# Patient Record
Sex: Female | Born: 1940
Health system: Southern US, Community
[De-identification: ages and names within clinical notes are randomized; demographics above are authoritative.]

## PROBLEM LIST (undated history)

## (undated) DIAGNOSIS — F32A Depression, unspecified: Secondary | ICD-10-CM

## (undated) DIAGNOSIS — I5022 Chronic systolic (congestive) heart failure: Secondary | ICD-10-CM

## (undated) DIAGNOSIS — G4733 Obstructive sleep apnea (adult) (pediatric): Secondary | ICD-10-CM

## (undated) DIAGNOSIS — I447 Left bundle-branch block, unspecified: Secondary | ICD-10-CM

## (undated) DIAGNOSIS — I428 Other cardiomyopathies: Secondary | ICD-10-CM

## (undated) DIAGNOSIS — Z720 Tobacco use: Secondary | ICD-10-CM

## (undated) DIAGNOSIS — N183 Chronic kidney disease, stage 3 unspecified: Secondary | ICD-10-CM

## (undated) DIAGNOSIS — R519 Headache, unspecified: Secondary | ICD-10-CM

## (undated) DIAGNOSIS — F419 Anxiety disorder, unspecified: Secondary | ICD-10-CM

## (undated) DIAGNOSIS — T8859XA Other complications of anesthesia, initial encounter: Secondary | ICD-10-CM

## (undated) DIAGNOSIS — K219 Gastro-esophageal reflux disease without esophagitis: Secondary | ICD-10-CM

## (undated) DIAGNOSIS — E785 Hyperlipidemia, unspecified: Secondary | ICD-10-CM

## (undated) DIAGNOSIS — R51 Headache: Secondary | ICD-10-CM

## (undated) DIAGNOSIS — E039 Hypothyroidism, unspecified: Secondary | ICD-10-CM

## (undated) DIAGNOSIS — I2510 Atherosclerotic heart disease of native coronary artery without angina pectoris: Secondary | ICD-10-CM

## (undated) DIAGNOSIS — Z95810 Presence of automatic (implantable) cardiac defibrillator: Secondary | ICD-10-CM

## (undated) DIAGNOSIS — E119 Type 2 diabetes mellitus without complications: Secondary | ICD-10-CM

## (undated) DIAGNOSIS — D649 Anemia, unspecified: Secondary | ICD-10-CM

## (undated) DIAGNOSIS — G8929 Other chronic pain: Secondary | ICD-10-CM

## (undated) DIAGNOSIS — I10 Essential (primary) hypertension: Secondary | ICD-10-CM

## (undated) DIAGNOSIS — M1990 Unspecified osteoarthritis, unspecified site: Secondary | ICD-10-CM

## (undated) DIAGNOSIS — J189 Pneumonia, unspecified organism: Secondary | ICD-10-CM

## (undated) DIAGNOSIS — T4145XA Adverse effect of unspecified anesthetic, initial encounter: Secondary | ICD-10-CM

## (undated) HISTORY — DX: Other chronic pain: G89.29

## (undated) HISTORY — DX: Chronic systolic (congestive) heart failure: I50.22

## (undated) HISTORY — DX: Left bundle-branch block, unspecified: I44.7

## (undated) HISTORY — DX: Tobacco use: Z72.0

## (undated) HISTORY — DX: Chronic kidney disease, stage 3 (moderate): N18.3

## (undated) HISTORY — DX: Essential (primary) hypertension: I10

## (undated) HISTORY — DX: Type 2 diabetes mellitus without complications: E11.9

## (undated) HISTORY — DX: Obstructive sleep apnea (adult) (pediatric): G47.33

## (undated) HISTORY — DX: Atherosclerotic heart disease of native coronary artery without angina pectoris: I25.10

## (undated) HISTORY — PX: CHOLECYSTECTOMY: SHX55

## (undated) HISTORY — DX: Chronic kidney disease, stage 3 unspecified: N18.30

## (undated) HISTORY — DX: Other cardiomyopathies: I42.8

## (undated) HISTORY — DX: Hyperlipidemia, unspecified: E78.5

## (undated) HISTORY — PX: EYE SURGERY: SHX253

---

## 1998-05-29 ENCOUNTER — Ambulatory Visit: Admission: RE | Admit: 1998-05-29 | Discharge: 1998-05-29 | Payer: Self-pay | Admitting: Family Medicine

## 1998-07-13 ENCOUNTER — Encounter: Payer: Self-pay | Admitting: Neurological Surgery

## 1998-07-13 ENCOUNTER — Ambulatory Visit (HOSPITAL_COMMUNITY): Admission: RE | Admit: 1998-07-13 | Discharge: 1998-07-13 | Payer: Self-pay | Admitting: Neurological Surgery

## 1998-08-25 ENCOUNTER — Other Ambulatory Visit: Admission: RE | Admit: 1998-08-25 | Discharge: 1998-08-25 | Payer: Self-pay | Admitting: Obstetrics and Gynecology

## 1998-09-07 ENCOUNTER — Ambulatory Visit (HOSPITAL_COMMUNITY): Admission: RE | Admit: 1998-09-07 | Discharge: 1998-09-07 | Payer: Self-pay | Admitting: Gastroenterology

## 1999-08-26 ENCOUNTER — Encounter: Payer: Self-pay | Admitting: *Deleted

## 1999-08-26 ENCOUNTER — Inpatient Hospital Stay (HOSPITAL_COMMUNITY): Admission: EM | Admit: 1999-08-26 | Discharge: 1999-08-27 | Payer: Self-pay | Admitting: *Deleted

## 1999-09-13 ENCOUNTER — Encounter: Admission: RE | Admit: 1999-09-13 | Discharge: 1999-09-13 | Payer: Self-pay | Admitting: *Deleted

## 1999-09-13 ENCOUNTER — Encounter: Payer: Self-pay | Admitting: *Deleted

## 1999-09-15 ENCOUNTER — Ambulatory Visit (HOSPITAL_BASED_OUTPATIENT_CLINIC_OR_DEPARTMENT_OTHER): Admission: RE | Admit: 1999-09-15 | Discharge: 1999-09-15 | Payer: Self-pay | Admitting: *Deleted

## 1999-09-15 ENCOUNTER — Encounter (INDEPENDENT_AMBULATORY_CARE_PROVIDER_SITE_OTHER): Payer: Self-pay | Admitting: Specialist

## 2000-04-17 ENCOUNTER — Encounter: Payer: Self-pay | Admitting: Orthopedic Surgery

## 2000-04-17 ENCOUNTER — Encounter: Admission: RE | Admit: 2000-04-17 | Discharge: 2000-04-17 | Payer: Self-pay | Admitting: Orthopedic Surgery

## 2000-07-12 ENCOUNTER — Encounter (INDEPENDENT_AMBULATORY_CARE_PROVIDER_SITE_OTHER): Payer: Self-pay | Admitting: *Deleted

## 2000-07-12 ENCOUNTER — Ambulatory Visit (HOSPITAL_BASED_OUTPATIENT_CLINIC_OR_DEPARTMENT_OTHER): Admission: RE | Admit: 2000-07-12 | Discharge: 2000-07-12 | Payer: Self-pay | Admitting: *Deleted

## 2000-07-28 ENCOUNTER — Other Ambulatory Visit: Admission: RE | Admit: 2000-07-28 | Discharge: 2000-07-28 | Payer: Self-pay | Admitting: Obstetrics and Gynecology

## 2000-09-06 ENCOUNTER — Ambulatory Visit (HOSPITAL_BASED_OUTPATIENT_CLINIC_OR_DEPARTMENT_OTHER): Admission: RE | Admit: 2000-09-06 | Discharge: 2000-09-07 | Payer: Self-pay | Admitting: Orthopedic Surgery

## 2000-11-01 ENCOUNTER — Encounter: Admission: RE | Admit: 2000-11-01 | Discharge: 2000-11-01 | Payer: Self-pay | Admitting: Orthopedic Surgery

## 2000-11-01 ENCOUNTER — Encounter: Payer: Self-pay | Admitting: Orthopedic Surgery

## 2000-12-11 ENCOUNTER — Encounter: Payer: Self-pay | Admitting: Family Medicine

## 2000-12-11 ENCOUNTER — Encounter: Admission: RE | Admit: 2000-12-11 | Discharge: 2000-12-11 | Payer: Self-pay | Admitting: Family Medicine

## 2001-02-02 ENCOUNTER — Encounter: Payer: Self-pay | Admitting: Neurological Surgery

## 2001-02-06 ENCOUNTER — Encounter: Payer: Self-pay | Admitting: Neurological Surgery

## 2001-02-06 ENCOUNTER — Inpatient Hospital Stay (HOSPITAL_COMMUNITY): Admission: RE | Admit: 2001-02-06 | Discharge: 2001-02-08 | Payer: Self-pay | Admitting: Neurological Surgery

## 2001-04-12 ENCOUNTER — Encounter: Payer: Self-pay | Admitting: Neurological Surgery

## 2001-04-12 ENCOUNTER — Encounter: Admission: RE | Admit: 2001-04-12 | Discharge: 2001-04-12 | Payer: Self-pay | Admitting: Neurological Surgery

## 2001-05-09 ENCOUNTER — Ambulatory Visit (HOSPITAL_BASED_OUTPATIENT_CLINIC_OR_DEPARTMENT_OTHER): Admission: RE | Admit: 2001-05-09 | Discharge: 2001-05-10 | Payer: Self-pay | Admitting: Orthopedic Surgery

## 2001-06-11 ENCOUNTER — Encounter: Admission: RE | Admit: 2001-06-11 | Discharge: 2001-06-11 | Payer: Self-pay | Admitting: Orthopedic Surgery

## 2001-06-11 ENCOUNTER — Encounter: Payer: Self-pay | Admitting: Orthopedic Surgery

## 2001-06-28 ENCOUNTER — Encounter: Payer: Self-pay | Admitting: Internal Medicine

## 2001-07-26 ENCOUNTER — Encounter: Admission: RE | Admit: 2001-07-26 | Discharge: 2001-07-26 | Payer: Self-pay | Admitting: Neurological Surgery

## 2001-07-26 ENCOUNTER — Encounter: Payer: Self-pay | Admitting: Neurological Surgery

## 2001-08-13 ENCOUNTER — Other Ambulatory Visit: Admission: RE | Admit: 2001-08-13 | Discharge: 2001-08-13 | Payer: Self-pay | Admitting: Obstetrics and Gynecology

## 2001-09-05 ENCOUNTER — Encounter: Admission: RE | Admit: 2001-09-05 | Discharge: 2001-09-05 | Payer: Self-pay | Admitting: Anesthesiology

## 2001-09-11 ENCOUNTER — Encounter: Payer: Self-pay | Admitting: Internal Medicine

## 2001-09-11 ENCOUNTER — Ambulatory Visit (HOSPITAL_BASED_OUTPATIENT_CLINIC_OR_DEPARTMENT_OTHER): Admission: RE | Admit: 2001-09-11 | Discharge: 2001-09-11 | Payer: Self-pay | Admitting: Internal Medicine

## 2001-10-08 ENCOUNTER — Observation Stay (HOSPITAL_COMMUNITY): Admission: RE | Admit: 2001-10-08 | Discharge: 2001-10-09 | Payer: Self-pay | Admitting: Urology

## 2002-01-09 ENCOUNTER — Encounter: Admission: RE | Admit: 2002-01-09 | Discharge: 2002-04-09 | Payer: Self-pay | Admitting: Family Medicine

## 2002-03-13 ENCOUNTER — Encounter: Admission: RE | Admit: 2002-03-13 | Discharge: 2002-03-13 | Payer: Self-pay | Admitting: Anesthesiology

## 2002-03-13 ENCOUNTER — Encounter: Payer: Self-pay | Admitting: Anesthesiology

## 2002-04-08 ENCOUNTER — Ambulatory Visit (HOSPITAL_COMMUNITY): Admission: RE | Admit: 2002-04-08 | Discharge: 2002-04-08 | Payer: Self-pay | Admitting: Gastroenterology

## 2002-04-08 ENCOUNTER — Encounter (INDEPENDENT_AMBULATORY_CARE_PROVIDER_SITE_OTHER): Payer: Self-pay | Admitting: *Deleted

## 2002-09-09 ENCOUNTER — Encounter: Payer: Self-pay | Admitting: Emergency Medicine

## 2002-09-09 ENCOUNTER — Emergency Department (HOSPITAL_COMMUNITY): Admission: EM | Admit: 2002-09-09 | Discharge: 2002-09-09 | Payer: Self-pay | Admitting: Emergency Medicine

## 2002-09-18 ENCOUNTER — Encounter: Admission: RE | Admit: 2002-09-18 | Discharge: 2002-09-18 | Payer: Self-pay | Admitting: Endocrinology

## 2002-09-18 ENCOUNTER — Encounter: Payer: Self-pay | Admitting: Endocrinology

## 2002-11-14 ENCOUNTER — Encounter: Admission: RE | Admit: 2002-11-14 | Discharge: 2002-11-14 | Payer: Self-pay | Admitting: *Deleted

## 2002-11-14 ENCOUNTER — Encounter: Payer: Self-pay | Admitting: *Deleted

## 2002-11-18 ENCOUNTER — Ambulatory Visit (HOSPITAL_COMMUNITY): Admission: RE | Admit: 2002-11-18 | Discharge: 2002-11-18 | Payer: Self-pay | Admitting: *Deleted

## 2002-11-18 ENCOUNTER — Encounter (INDEPENDENT_AMBULATORY_CARE_PROVIDER_SITE_OTHER): Payer: Self-pay | Admitting: *Deleted

## 2003-01-17 ENCOUNTER — Other Ambulatory Visit: Admission: RE | Admit: 2003-01-17 | Discharge: 2003-01-17 | Payer: Self-pay | Admitting: Obstetrics and Gynecology

## 2004-01-15 ENCOUNTER — Encounter: Admission: RE | Admit: 2004-01-15 | Discharge: 2004-01-15 | Payer: Self-pay | Admitting: *Deleted

## 2004-04-28 ENCOUNTER — Other Ambulatory Visit: Admission: RE | Admit: 2004-04-28 | Discharge: 2004-04-28 | Payer: Self-pay | Admitting: Obstetrics and Gynecology

## 2004-06-10 ENCOUNTER — Encounter: Admission: RE | Admit: 2004-06-10 | Discharge: 2004-06-10 | Payer: Self-pay | Admitting: Orthopedic Surgery

## 2004-09-18 ENCOUNTER — Inpatient Hospital Stay (HOSPITAL_COMMUNITY): Admission: EM | Admit: 2004-09-18 | Discharge: 2004-09-20 | Payer: Self-pay | Admitting: Emergency Medicine

## 2004-10-19 ENCOUNTER — Encounter (INDEPENDENT_AMBULATORY_CARE_PROVIDER_SITE_OTHER): Payer: Self-pay | Admitting: *Deleted

## 2004-10-19 ENCOUNTER — Ambulatory Visit (HOSPITAL_COMMUNITY): Admission: RE | Admit: 2004-10-19 | Discharge: 2004-10-19 | Payer: Self-pay | Admitting: Obstetrics and Gynecology

## 2005-02-17 ENCOUNTER — Ambulatory Visit (HOSPITAL_COMMUNITY): Admission: RE | Admit: 2005-02-17 | Discharge: 2005-02-17 | Payer: Self-pay | Admitting: Otolaryngology

## 2005-02-17 ENCOUNTER — Encounter (INDEPENDENT_AMBULATORY_CARE_PROVIDER_SITE_OTHER): Payer: Self-pay | Admitting: *Deleted

## 2005-03-10 ENCOUNTER — Other Ambulatory Visit: Admission: RE | Admit: 2005-03-10 | Discharge: 2005-03-10 | Payer: Self-pay | Admitting: Obstetrics and Gynecology

## 2005-07-06 ENCOUNTER — Ambulatory Visit (HOSPITAL_COMMUNITY): Admission: RE | Admit: 2005-07-06 | Discharge: 2005-07-06 | Payer: Self-pay | Admitting: Neurological Surgery

## 2005-10-07 ENCOUNTER — Encounter: Admission: RE | Admit: 2005-10-07 | Discharge: 2005-10-07 | Payer: Self-pay | Admitting: Gastroenterology

## 2006-12-14 ENCOUNTER — Inpatient Hospital Stay (HOSPITAL_COMMUNITY): Admission: AD | Admit: 2006-12-14 | Discharge: 2006-12-15 | Payer: Self-pay | Admitting: Cardiology

## 2006-12-14 HISTORY — PX: CARDIAC CATHETERIZATION: SHX172

## 2006-12-18 ENCOUNTER — Encounter (INDEPENDENT_AMBULATORY_CARE_PROVIDER_SITE_OTHER): Payer: Self-pay | Admitting: Otolaryngology

## 2006-12-18 ENCOUNTER — Ambulatory Visit (HOSPITAL_COMMUNITY): Admission: RE | Admit: 2006-12-18 | Discharge: 2006-12-18 | Payer: Self-pay | Admitting: Otolaryngology

## 2007-01-03 ENCOUNTER — Ambulatory Visit: Admission: RE | Admit: 2007-01-03 | Discharge: 2007-02-12 | Payer: Self-pay | Admitting: Radiation Oncology

## 2007-05-04 ENCOUNTER — Ambulatory Visit (HOSPITAL_COMMUNITY): Admission: RE | Admit: 2007-05-04 | Discharge: 2007-05-04 | Payer: Self-pay | Admitting: Cardiology

## 2007-07-16 ENCOUNTER — Encounter: Admission: RE | Admit: 2007-07-16 | Discharge: 2007-07-16 | Payer: Self-pay | Admitting: Family Medicine

## 2007-08-13 ENCOUNTER — Encounter: Admission: RE | Admit: 2007-08-13 | Discharge: 2007-08-13 | Payer: Self-pay | Admitting: Cardiology

## 2007-12-02 ENCOUNTER — Encounter: Admission: RE | Admit: 2007-12-02 | Discharge: 2007-12-02 | Payer: Self-pay | Admitting: Family Medicine

## 2008-02-04 ENCOUNTER — Ambulatory Visit: Payer: Self-pay | Admitting: *Deleted

## 2008-02-04 ENCOUNTER — Inpatient Hospital Stay (HOSPITAL_COMMUNITY): Admission: AD | Admit: 2008-02-04 | Discharge: 2008-02-06 | Payer: Self-pay | Admitting: *Deleted

## 2008-06-09 ENCOUNTER — Encounter: Admission: RE | Admit: 2008-06-09 | Discharge: 2008-06-09 | Payer: Self-pay | Admitting: Gastroenterology

## 2008-10-02 ENCOUNTER — Telehealth: Payer: Self-pay | Admitting: Internal Medicine

## 2008-10-03 ENCOUNTER — Ambulatory Visit: Payer: Self-pay | Admitting: Internal Medicine

## 2008-10-03 DIAGNOSIS — I10 Essential (primary) hypertension: Secondary | ICD-10-CM | POA: Insufficient documentation

## 2008-10-03 DIAGNOSIS — G4733 Obstructive sleep apnea (adult) (pediatric): Secondary | ICD-10-CM | POA: Insufficient documentation

## 2008-10-03 DIAGNOSIS — G4730 Sleep apnea, unspecified: Secondary | ICD-10-CM | POA: Insufficient documentation

## 2008-10-03 DIAGNOSIS — J449 Chronic obstructive pulmonary disease, unspecified: Secondary | ICD-10-CM | POA: Insufficient documentation

## 2008-10-08 ENCOUNTER — Telehealth (INDEPENDENT_AMBULATORY_CARE_PROVIDER_SITE_OTHER): Payer: Self-pay | Admitting: *Deleted

## 2008-10-20 ENCOUNTER — Ambulatory Visit: Payer: Self-pay | Admitting: Internal Medicine

## 2008-11-20 ENCOUNTER — Ambulatory Visit: Payer: Self-pay | Admitting: Internal Medicine

## 2008-11-24 ENCOUNTER — Telehealth: Payer: Self-pay | Admitting: Internal Medicine

## 2008-11-26 ENCOUNTER — Encounter: Payer: Self-pay | Admitting: Internal Medicine

## 2008-12-10 ENCOUNTER — Encounter: Payer: Self-pay | Admitting: Internal Medicine

## 2008-12-12 ENCOUNTER — Encounter: Payer: Self-pay | Admitting: Internal Medicine

## 2008-12-18 ENCOUNTER — Telehealth (INDEPENDENT_AMBULATORY_CARE_PROVIDER_SITE_OTHER): Payer: Self-pay | Admitting: *Deleted

## 2009-02-02 ENCOUNTER — Encounter: Payer: Self-pay | Admitting: Internal Medicine

## 2009-07-03 ENCOUNTER — Encounter: Admission: RE | Admit: 2009-07-03 | Discharge: 2009-07-03 | Payer: Self-pay | Admitting: Obstetrics and Gynecology

## 2009-08-20 ENCOUNTER — Ambulatory Visit: Payer: Self-pay | Admitting: Cardiology

## 2009-08-20 ENCOUNTER — Inpatient Hospital Stay (HOSPITAL_COMMUNITY): Admission: EM | Admit: 2009-08-20 | Discharge: 2009-08-21 | Payer: Self-pay | Admitting: Emergency Medicine

## 2009-08-21 ENCOUNTER — Ambulatory Visit: Payer: Self-pay | Admitting: Vascular Surgery

## 2009-08-21 ENCOUNTER — Encounter (INDEPENDENT_AMBULATORY_CARE_PROVIDER_SITE_OTHER): Payer: Self-pay | Admitting: Internal Medicine

## 2009-10-31 ENCOUNTER — Encounter: Payer: Self-pay | Admitting: Internal Medicine

## 2009-12-23 ENCOUNTER — Encounter: Admission: RE | Admit: 2009-12-23 | Discharge: 2009-12-23 | Payer: Self-pay | Admitting: Neurology

## 2010-02-04 ENCOUNTER — Encounter: Payer: Self-pay | Admitting: Internal Medicine

## 2010-02-09 ENCOUNTER — Encounter: Payer: Self-pay | Admitting: Internal Medicine

## 2010-03-10 ENCOUNTER — Encounter
Admission: RE | Admit: 2010-03-10 | Discharge: 2010-03-10 | Payer: Self-pay | Source: Home / Self Care | Attending: Neurological Surgery | Admitting: Neurological Surgery

## 2010-03-22 ENCOUNTER — Ambulatory Visit (HOSPITAL_COMMUNITY)
Admission: RE | Admit: 2010-03-22 | Discharge: 2010-03-22 | Payer: Self-pay | Source: Home / Self Care | Attending: Neurological Surgery | Admitting: Neurological Surgery

## 2010-03-24 LAB — BASIC METABOLIC PANEL
BUN: 12 mg/dL (ref 6–23)
CO2: 29 mEq/L (ref 19–32)
Calcium: 9.8 mg/dL (ref 8.4–10.5)
Chloride: 103 mEq/L (ref 96–112)
Creatinine, Ser: 1.19 mg/dL (ref 0.4–1.2)
GFR calc Af Amer: 55 mL/min — ABNORMAL LOW (ref >60)
GFR calc non Af Amer: 45 mL/min — ABNORMAL LOW (ref >60)
Glucose, Bld: 161 mg/dL — ABNORMAL HIGH (ref 70–99)
Potassium: 3.8 mEq/L (ref 3.5–5.1)
Sodium: 141 mEq/L (ref 135–145)

## 2010-03-24 LAB — CBC
HCT: 40.9 % (ref 36.0–46.0)
Hemoglobin: 13.9 g/dL (ref 12.0–15.0)
MCH: 31.9 pg (ref 26.0–34.0)
MCHC: 34 g/dL (ref 30.0–36.0)
MCV: 93.8 fL (ref 78.0–100.0)
Platelets: 218 10*3/uL (ref 150–400)
RBC: 4.36 MIL/uL (ref 3.87–5.11)
RDW: 13.5 % (ref 11.5–15.5)
WBC: 7.6 10*3/uL (ref 4.0–10.5)

## 2010-03-24 LAB — SURGICAL PCR SCREEN
MRSA, PCR: NEGATIVE
Staphylococcus aureus: POSITIVE — AB

## 2010-03-27 ENCOUNTER — Encounter: Payer: Self-pay | Admitting: Neurological Surgery

## 2010-03-27 ENCOUNTER — Encounter: Payer: Self-pay | Admitting: *Deleted

## 2010-03-28 ENCOUNTER — Encounter: Payer: Self-pay | Admitting: *Deleted

## 2010-03-31 ENCOUNTER — Ambulatory Visit (HOSPITAL_COMMUNITY)
Admission: RE | Admit: 2010-03-31 | Discharge: 2010-03-31 | Payer: Self-pay | Source: Home / Self Care | Attending: Cardiovascular Disease | Admitting: Cardiovascular Disease

## 2010-03-31 LAB — GLUCOSE, CAPILLARY
Glucose-Capillary: 150 mg/dL — ABNORMAL HIGH (ref 70–99)
Glucose-Capillary: 108 mg/dL — ABNORMAL HIGH (ref 70–99)

## 2010-04-08 NOTE — Letter (Signed)
Summary: [facility]  [facility]   Imported By: Bubba Hales 02/12/2010 10:59:14  _____________________________________________________________________  External Attachment:    Type:   Image     Comment:   External Document

## 2010-04-08 NOTE — Letter (Signed)
Summary: [facility]  [facility]   Imported By: Bubba Hales 11/11/2009 08:49:32  _____________________________________________________________________  External Attachment:    Type:   Image     Comment:   External Document

## 2010-04-08 NOTE — Procedures (Signed)
Summary: Order for Overnight Pulse Oximetry/IDS  Order for Overnight Pulse Oximetry/IDS   Imported By: Phillis Knack 12/03/2008 11:22:17  _____________________________________________________________________  External Attachment:    Type:   Image     Comment:   External Document

## 2010-04-08 NOTE — Letter (Signed)
Summary: [facility]  [facility]   Imported By: Bubba Hales 02/19/2010 11:38:16  _____________________________________________________________________  External Attachment:    Type:   Image     Comment:   External Document

## 2010-04-08 NOTE — Letter (Signed)
Summary: [facility]  [facility]   Imported By: Bubba Hales 02/04/2009 09:41:38  _____________________________________________________________________  External Attachment:    Type:   Image     Comment:   External Document

## 2010-04-13 ENCOUNTER — Ambulatory Visit (HOSPITAL_COMMUNITY)
Admission: RE | Admit: 2010-04-13 | Discharge: 2010-04-13 | Disposition: A | Discharge status: Home / Self Care | Payer: Medicare FFS | Source: Ambulatory Visit | Attending: Neurological Surgery | Admitting: Neurological Surgery

## 2010-04-13 LAB — CBC
HCT: 41.1 % (ref 36.0–46.0)
Hemoglobin: 14 g/dL (ref 12.0–15.0)
MCH: 31.9 pg (ref 26.0–34.0)
MCHC: 34.1 g/dL (ref 30.0–36.0)
MCV: 93.6 fL (ref 78.0–100.0)
Platelets: 227 10*3/uL (ref 150–400)
RBC: 4.39 MIL/uL (ref 3.87–5.11)
RDW: 13.4 % (ref 11.5–15.5)
WBC: 7.3 10*3/uL (ref 4.0–10.5)

## 2010-04-13 LAB — BASIC METABOLIC PANEL
BUN: 10 mg/dL (ref 6–23)
CO2: 28 mEq/L (ref 19–32)
Calcium: 9.7 mg/dL (ref 8.4–10.5)
Chloride: 102 mEq/L (ref 96–112)
Creatinine, Ser: 1.08 mg/dL (ref 0.4–1.2)
GFR calc Af Amer: >60 mL/min (ref >60)
GFR calc non Af Amer: 50 mL/min — ABNORMAL LOW (ref >60)
Glucose, Bld: 160 mg/dL — ABNORMAL HIGH (ref 70–99)
Potassium: 4.8 mEq/L (ref 3.5–5.1)
Sodium: 140 mEq/L (ref 135–145)

## 2010-04-13 LAB — SURGICAL PCR SCREEN
MRSA, PCR: NEGATIVE
Staphylococcus aureus: NEGATIVE

## 2010-04-15 ENCOUNTER — Inpatient Hospital Stay (HOSPITAL_COMMUNITY)
Admission: RE | Admit: 2010-04-15 | Discharge: 2010-04-16 | DRG: 491 | Disposition: A | Discharge status: Home / Self Care | Payer: Medicare FFS | Source: Ambulatory Visit | Attending: Neurological Surgery | Admitting: Neurological Surgery

## 2010-04-15 ENCOUNTER — Ambulatory Visit (HOSPITAL_COMMUNITY)
Admission: RE | Admit: 2010-04-15 | Discharge: 2010-04-15 | Disposition: A | Discharge status: Home / Self Care | Payer: Medicare FFS | Source: Ambulatory Visit | Attending: Neurological Surgery | Admitting: Neurological Surgery

## 2010-04-15 ENCOUNTER — Other Ambulatory Visit (HOSPITAL_COMMUNITY): Payer: Self-pay | Admitting: Neurological Surgery

## 2010-04-15 DIAGNOSIS — Q762 Congenital spondylolisthesis: Principal | ICD-10-CM

## 2010-04-15 DIAGNOSIS — E039 Hypothyroidism, unspecified: Secondary | ICD-10-CM | POA: Diagnosis present

## 2010-04-15 DIAGNOSIS — Z9861 Coronary angioplasty status: Secondary | ICD-10-CM

## 2010-04-15 DIAGNOSIS — Z7982 Long term (current) use of aspirin: Secondary | ICD-10-CM

## 2010-04-15 DIAGNOSIS — G4733 Obstructive sleep apnea (adult) (pediatric): Secondary | ICD-10-CM | POA: Diagnosis present

## 2010-04-15 DIAGNOSIS — M545 Low back pain, unspecified: Secondary | ICD-10-CM

## 2010-04-15 DIAGNOSIS — E1149 Type 2 diabetes mellitus with other diabetic neurological complication: Secondary | ICD-10-CM | POA: Diagnosis present

## 2010-04-15 DIAGNOSIS — I2510 Atherosclerotic heart disease of native coronary artery without angina pectoris: Secondary | ICD-10-CM | POA: Diagnosis present

## 2010-04-15 DIAGNOSIS — I10 Essential (primary) hypertension: Secondary | ICD-10-CM | POA: Diagnosis present

## 2010-04-15 DIAGNOSIS — F17200 Nicotine dependence, unspecified, uncomplicated: Secondary | ICD-10-CM | POA: Diagnosis present

## 2010-04-15 DIAGNOSIS — Z79899 Other long term (current) drug therapy: Secondary | ICD-10-CM

## 2010-04-15 DIAGNOSIS — M47817 Spondylosis without myelopathy or radiculopathy, lumbosacral region: Secondary | ICD-10-CM | POA: Diagnosis present

## 2010-04-15 DIAGNOSIS — E1142 Type 2 diabetes mellitus with diabetic polyneuropathy: Secondary | ICD-10-CM | POA: Diagnosis present

## 2010-04-15 DIAGNOSIS — E669 Obesity, unspecified: Secondary | ICD-10-CM | POA: Diagnosis present

## 2010-04-15 DIAGNOSIS — Z7902 Long term (current) use of antithrombotics/antiplatelets: Secondary | ICD-10-CM

## 2010-04-15 LAB — GLUCOSE, CAPILLARY
Glucose-Capillary: 120 mg/dL — ABNORMAL HIGH (ref 70–99)
Glucose-Capillary: 96 mg/dL (ref 70–99)
Glucose-Capillary: 114 mg/dL — ABNORMAL HIGH (ref 70–99)

## 2010-04-16 LAB — GLUCOSE, CAPILLARY
Glucose-Capillary: 96 mg/dL (ref 70–99)
Glucose-Capillary: 111 mg/dL — ABNORMAL HIGH (ref 70–99)
Glucose-Capillary: 110 mg/dL — ABNORMAL HIGH (ref 70–99)

## 2010-04-16 NOTE — Procedures (Signed)
NAMEKADIAN, MURREY NO.:  [id number]  MEDICAL RECORD NO.:  [id number]          PATIENT TYPE:  OIB  LOCATION:  2899                         FACILITY:  [facility]  PHYSICIAN:  Quay Burow, M.D.   DATE OF BIRTH:  [birth year]  DATE OF PROCEDURE:  03/31/2010 DATE OF DISCHARGE:  03/31/2010                           CARDIAC CATHETERIZATION   [name] is a 70 year old mildly overweight, married, Caucasian female mother of 14, grandmother of 2 grandchildren who I initially saw in the office on March 25, 2010.  She has a history of CAD status post RCA stenting in the past.  She was catheterized by Dr. Myrtice Lauth on May 04, 2007, after an abnormal Myoview which showed 30-40% eccentric distal left main stenosis.  Proximal RCA stent was patent and EF was normal.  Other problems include hypertension, hyperlipidemia, and diabetes.  She has chronic left bundle-branch block.  She continues to smoke 1-1/2 packs per day.  She is scheduled for back surgery by Dr. Ellene Route.  Myoview stress test showed new anteroseptal scar without ischemia.  She does complain of some chronic shortness of breath with occasional chest pain.  The patient presents now for diagnostic coronary arteriography as an outpatient to define her anatomy and risk stratify her before her elective surgery.  PROCEDURE DESCRIPTION:  The patient was brought to the second floor [facility] in the postabsorptive state.  She was premedicated with IV Versed and fentanyl.  Her right groin was prepped and shaved in the usual sterile fashion.  Xylocaine 1% was used for local anesthesia.  A 5-French sheath was inserted into the right femoral artery using standard Seldinger technique.  A 5-French right and left Judkins diagnostic catheter as well as 5-French pigtail catheter were used for selective coronary angiography and left ventriculography respectively.  Visipaque dye was used for entirety of the  case. Retrograde aortic and left ventricular pullback pressures were recorded.  HEMODYNAMIC RESULTS: 1. Aortic systolic pressure [id number], diastolic pressure 65. 2. Left ventricular systolic pressure 0000000, end-diastolic pressure 11.  SELECTIVE CORONARY ANGIOGRAPHY: 1. Left main; left main had a 40% distal taper stenosis. 2. LAD; the LAD was free of disease. 3. Left circumflex; nondominant and free of significant disease. 4. Ramus branch; moderate size and free of significant disease. 5. RCA; dominant with angiographically visible stent in the proximal     portion.  There was smooth 50% "in-stent restenosis" unchanged from     prior cath performed 3 years ago. 6. Left Ventriculography;  RAO left ventriculogram was performed using     25 mL of Visipaque dye at 12 mL per second.  The overall LVEF was     estimated at 55-60% without focal wall motion abnormalities.  IMPRESSION:  [name] has moderate to noncritical coronary artery disease, unchanged from her prior cath 3 years ago.  I think her Myoview abnormality was artifactual.  Sheath was removed and pressure was held on the groin to achieve hemostasis.  The patient left lab in stable condition. She will be discharged home later today as an outpatient and will see me back  in the office.  She will be cleared for upcoming back surgery at moderate risk given her moderate CAD.     Quay Burow, M.D.     JB/MEDQ  D:  03/31/2010  T:  04/01/2010  Job:  [id number]  cc:   Second Obert. Electa Sniff, M.D. Earleen Newport, M.D.  Electronically Signed by Quay Burow M.D. on 04/16/2010 08:08:03 AM

## 2010-04-29 NOTE — Op Note (Signed)
[name] NO.:  [id number]  MEDICAL RECORD NO.:  [id number]           PATIENT TYPE:  O  LOCATION:  XRAY                         FACILITY:  [facility]  PHYSICIAN:  Earleen Newport, M.D.  DATE OF BIRTH:  1940-11-03  DATE OF PROCEDURE:  04/15/2010 DATE OF DISCHARGE:  04/15/2010                              OPERATIVE REPORT   PREOPERATIVE DIAGNOSIS:  Lumbar spondylosis and stenosis with spondylolisthesis L3-L4, left lumbar radiculopathy L3 and L4 distributions.  POSTOPERATIVE DIAGNOSIS:  Lumbar spondylosis and stenosis with spondylolisthesis L3-L4, left lumbar radiculopathy L3 and L4 distributions.  PROCEDURE:  L3-L4 laminotomy and foraminotomy on the left with decompression of the L3 and L4 nerve roots using operating microscope and microdissection technique.  SURGEON:  Earleen Newport, MD  FIRST ASSISTANT:  Leeroy Cha, MD  ANESTHESIA:  General endotracheal.  INDICATIONS:  [name] is a 70 year old individual who has had significant back and left lower extremity pain.  She has evidence of spondylitic stenosis with a spondylolisthesis at the level of L3-L4.  Her left L3 and L4 nerve roots are entrapped in the foramen.  PROCEDURE IN DETAIL:  The patient was brought to the operating room supine on a stretcher after smooth induction of general endotracheal anesthesia.  She was turned prone and the back was prepped with alcohol and DuraPrep and draped in a sterile fashion.  Midline incision was created in the midportion of the lumbar spine and this was carried down to the lumbodorsal fascia.  X-ray localization with a needle near the L3- L4 interlaminar space was obtained and then dissection was carried out further to expose the laminar arch of L3 and the L4 space.  Then, with a self-retaining retractor in place, a high-speed bur was used to create a laminotomy, removing the inferior margin of lamina of L3 to an including a portion of the mesial wall  of the facet.  Superior margin of the lamina of L4 was also removed and the yellow ligament here was redundant and thickened and it was taken up with a 2-mm Kerrison punch. Ultimately, I was able to expose a portion of the medial aspect of the dura.  This was traced out and some redundant ligamentous material in this region was removed.  Laminotomy was enlarged superiorly.  The second localizing radiograph identified at what was felt to be the L4-L5 space.  Therefore, readjusting the microscope and moving up a level, we performed laminotomy at the level above.  A followup x-ray identified this as the L2-L3 interspace.  The dissection was then carried down to L3-L4 space and ultimately, I was able to decompress the takeoff of the L3 nerve root as it entered into the foramen.  There was a substantial amount of redundant ligamentous material in this region.  I was also able to dissect out and remove the ligament from the undersurface of the L3 lamina along the L4 nerve root.  This was in the area of the spondylolisthesis which was the tightest and narrowest in the lateral recess.  With this, we were able to decompress the L4 nerve root.  Once  this was completed, the area was irrigated copiously with antibiotic irrigating solution.  The nerve roots were sounded and found to be easily free and clear in the L3 and L4 distributions.  Hemostasis in the space was achieved meticulously with a bipolar cautery and then after final irrigation, the retractor was removed.  The microscope was removed.  The lumbodorsal fascia was closed with #1 Vicryl in an interrupted fashion, 2-0 Vicryl was used in the subcutaneous tissues, 3-0 Vicryl subcuticularly, and Dermabond on the skin.  The patient tolerated the procedure well.  Blood loss was estimated less than 50 mL.     Earleen Newport, M.D.     [name]  D:  04/15/2010  T:  04/16/2010  Job:  [id number]  Electronically Signed by Kristeen Miss M.D. on  04/29/2010 08:03:30 AM

## 2010-05-23 LAB — CARDIAC PANEL(CRET KIN+CKTOT+MB+TROPI)
CK, MB: 0.9 ng/mL (ref 0.3–4.0)
Relative Index: INVALID (ref 0.0–2.5)
Total CK: 38 U/L (ref 7–177)
Troponin I: <0.01 ng/mL (ref 0.00–0.06)

## 2010-05-23 LAB — URINALYSIS, ROUTINE W REFLEX MICROSCOPIC
Bilirubin Urine: NEGATIVE
Glucose, UA: NEGATIVE mg/dL
Ketones, ur: NEGATIVE mg/dL
Nitrite: NEGATIVE
Protein, ur: NEGATIVE mg/dL
Specific Gravity, Urine: 1.006 (ref 1.005–1.030)
Urobilinogen, UA: 0.2 mg/dL (ref 0.0–1.0)
pH: 6 (ref 5.0–8.0)

## 2010-05-23 LAB — GLUCOSE, CAPILLARY
Glucose-Capillary: 119 mg/dL — ABNORMAL HIGH (ref 70–99)
Glucose-Capillary: 185 mg/dL — ABNORMAL HIGH (ref 70–99)

## 2010-05-23 LAB — CBC
HCT: 40.5 % (ref 36.0–46.0)
Hemoglobin: 14.1 g/dL (ref 12.0–15.0)
MCHC: 34.8 g/dL (ref 30.0–36.0)
MCV: 94.8 fL (ref 78.0–100.0)
Platelets: ADEQUATE 10*3/uL (ref 150–400)
RBC: 4.27 MIL/uL (ref 3.87–5.11)
RDW: 13.7 % (ref 11.5–15.5)
WBC: 7.7 10*3/uL (ref 4.0–10.5)

## 2010-05-23 LAB — TROPONIN I: Troponin I: 0.01 ng/mL (ref 0.00–0.06)

## 2010-05-23 LAB — CK TOTAL AND CKMB (NOT AT ARMC)
CK, MB: 0.8 ng/mL (ref 0.3–4.0)
Relative Index: INVALID (ref 0.0–2.5)
Total CK: 41 U/L (ref 7–177)

## 2010-05-23 LAB — LIPID PANEL
Cholesterol: 141 mg/dL (ref 0–200)
HDL: 48 mg/dL (ref >39)
LDL Cholesterol: 68 mg/dL (ref 0–99)
Total CHOL/HDL Ratio: 2.9 RATIO
Triglycerides: 127 mg/dL (ref <150)
VLDL: 25 mg/dL (ref 0–40)

## 2010-05-23 LAB — URINE CULTURE: Colony Count: >=100000

## 2010-05-23 LAB — DIFFERENTIAL
Basophils Absolute: 0 10*3/uL (ref 0.0–0.1)
Basophils Relative: 0 % (ref 0–1)
Eosinophils Absolute: 0.2 10*3/uL (ref 0.0–0.7)
Eosinophils Relative: 3 % (ref 0–5)
Lymphocytes Relative: 24 % (ref 12–46)
Lymphs Abs: 1.8 10*3/uL (ref 0.7–4.0)
Monocytes Absolute: 0.5 10*3/uL (ref 0.1–1.0)
Monocytes Relative: 6 % (ref 3–12)
Neutro Abs: 5.2 10*3/uL (ref 1.7–7.7)
Neutrophils Relative %: 67 % (ref 43–77)

## 2010-05-23 LAB — PHOSPHORUS: Phosphorus: 3.7 mg/dL (ref 2.3–4.6)

## 2010-05-23 LAB — URINE MICROSCOPIC-ADD ON

## 2010-05-23 LAB — POCT CARDIAC MARKERS
CKMB, poc: <1 ng/mL — ABNORMAL LOW (ref 1.0–8.0)
Myoglobin, poc: 46.3 ng/mL (ref 12–200)
Troponin i, poc: <0.05 ng/mL (ref 0.00–0.09)

## 2010-05-23 LAB — BASIC METABOLIC PANEL
BUN: 14 mg/dL (ref 6–23)
CO2: 29 mEq/L (ref 19–32)
Calcium: 9.5 mg/dL (ref 8.4–10.5)
Chloride: 100 mEq/L (ref 96–112)
Creatinine, Ser: 1.1 mg/dL (ref 0.4–1.2)
GFR calc Af Amer: 60 mL/min — ABNORMAL LOW (ref >60)
GFR calc non Af Amer: 50 mL/min — ABNORMAL LOW (ref >60)
Glucose, Bld: 103 mg/dL — ABNORMAL HIGH (ref 70–99)
Potassium: 3.6 mEq/L (ref 3.5–5.1)
Sodium: 139 mEq/L (ref 135–145)

## 2010-05-23 LAB — HEMOGLOBIN A1C
Hgb A1c MFr Bld: 5.9 % — ABNORMAL HIGH (ref <5.7)
Mean Plasma Glucose: 123 mg/dL — ABNORMAL HIGH (ref <117)

## 2010-05-23 LAB — TSH: TSH: 1.485 u[IU]/mL (ref 0.350–4.500)

## 2010-05-23 LAB — MAGNESIUM: Magnesium: 2 mg/dL (ref 1.5–2.5)

## 2010-07-20 NOTE — Op Note (Signed)
NAMESHERROL, [name]                  ACCOUNT NO.:  [id number]   MEDICAL RECORD NO.:  [id number]          PATIENT TYPE:  AMB   LOCATION:  SDS                          FACILITY:  [facility]   PHYSICIAN:  Onnie Graham, MD     DATE OF BIRTH:  1940/09/10   DATE OF PROCEDURE:  12/18/2006  DATE OF DISCHARGE:                               OPERATIVE REPORT   PREOPERATIVE DIAGNOSIS:  1. Hoarseness.  2. Anterior right vocal fold erythroplakia.   POSTOPERATIVE DIAGNOSIS:  1. Hoarseness.  2. Anterior right vocal fold erythroplakia.   PROCEDURE:  Suspended micro direct laryngoscopy with right vocal cord  biopsy.   SURGEON:  Dr. Melida Quitter.   ANESTHESIA:  General endotracheal anesthesia.   COMPLICATIONS:  None.   INDICATIONS:  The patient is a 70 year old white female who has required  microlaryngoscopy with vocal cord stripping on four previous occasions  for right vocal cord precancerous changes.  Her last was in December  2006 and demonstrated dysplasia.  She has had worsening hoarseness  recently and was found to have a new red lesion of the right anterior  vocal cord and presents to the operating room for surgical management.   FINDINGS:  There was a friable grain granular lesion of the right  anterior vocal cord with slight involvement of the left anterior vocal  cord. The surface was removed using cup forceps and the specimen sent to  pathology.   DESCRIPTION OF PROCEDURE:  The patient was identified in the holding  room and informed consent having been obtained including a discussion of  risks, benefits, and alternatives, the patient was brought to the  operative suite and put on the operative table in the supine position.  Anesthesia was induced and the patient was intubated by the anesthesia  team without difficulty using a laser safe tube. The eyes were taped  closed and the bed was turned 90 degrees from anesthesia.  Damp eye pads  were taped over the eyes.  A damp gauze was  placed over the upper gum.  The larynx was then exposed using a Dedo laryngoscope and the airway  suctioned.  The laryngoscopes placed in a supraglottic position and was  placed in suspension using a Lewy arm on a Mayo stand.  An epinephrine  soaked pledget was held against the lesion for a couple of minutes and  then removed.  Preoperative photographs were taken. The microscope was  then used to evaluate the larynx and cup forceps were used to remove the  majority of the lesion on the right side.  There was a strip of mucosa  that came with it along the striking surface of the right vocal cord.  Epinephrine pledgets were held again and postoperative photographs were  then made.  The larynx was then sprayed  with topical lidocaine using an LTA and the laryngoscope was taken out  of suspension and removed from the patient's mouth suctioning the airway  on the way out.  The patient was then returned back to Anesthesia for  wake-up and was extubated and removed to the  recovery room in stable  condition.      Onnie Graham, MD  Electronically Signed     DDB/MEDQ  D:  12/18/2006  T:  12/18/2006  Job:  [id number]

## 2010-07-20 NOTE — Discharge Summary (Signed)
[name], HUTLEY NO.:  [id number]   MEDICAL RECORD NO.:  [id number]          PATIENT TYPE:  INP   LOCATION:  6529                         FACILITY:  [facility]   PHYSICIAN:  Otilio Carpen. Ingold, N.P.  DATE OF BIRTH:  10-31-1940   DATE OF ADMISSION:  12/14/2006  DATE OF DISCHARGE:  12/15/2006                               DISCHARGE SUMMARY   DISCHARGE DIAGNOSES:  1. Unstable angina.  2. Coronary artery disease with cardiac cath in [place]      revealing ulcerated 75% RCA stenosis then PTCA and stent deployment      with non drug-eluting Liberte stent to the RCA.  3. Residual coronary disease 40% left main stenosis.  4. EF 60%.  5. Thrombocytopenia as her Integrilin remains low at discharge.  6. Diabetes mellitus type 2.  7. Hyperlipidemia with treated LDL of 61 on Pravachol.  8. History of depression.  9. Chronic pain syndrome on methadone.  10.Tobacco abuse with continued smoking and difficulty stopping.      Consult was obtained.  11.Needs thyroid biopsy on December 18, 2006, her ENT surgeon talked      with Dr. Melvern Banker despite thrombocytopenia.  12.Thrombocytopenia as stated.   DISCHARGE CONDITION:  Improved and stable.   DISCHARGE MEDICATIONS:  1. Prevacid 30 mg twice a day.  2. Lexapro 30 mg daily.  3. Cymbalta 60 mg daily.  4. Actos 45 mg daily.  5. Lasix 40 mg three times a day.  6. KCl 10 mEq twice a day.  7. Aspirin 81 mg daily.  8. Methadone 5 mg daily as before, though in the hospital she actually      told us she was taking three times a day.  Therefore, she will      continue her home dosage.  9. Xanax 1 mg three times a day.  10.Synthroid 0.1 mg daily.  11.Pravachol 20 mg daily.  12.Reglan 5 mg three times a day.  13.Plavix 75 mg daily.  Do not stop it could cause a heart attack.  14.Hold Glucophage until Saturday evening.  It could interact with the      cath.  15.Glucophage 500 mg twice a day.   DISCHARGE INSTRUCTIONS:  1.  Increase activity slowly.  May shower or bathe.  No lifting for 2      days.  No driving for 2 days.  No sexual activity for 2 days.  2. A low-sodium heart-healthy diabetic diet.  3. Wash right groin cath site with soap and water.  Call if any      bleeding, swelling or drainage.  4. follow up with Dr. Melvern Banker, December 26, 2006, at 2:00 p.m.  5. Also, she will have blood work done Monday to recheck her      platelets.   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 [name], had called in our office complaining  of chest pain, substernal epigastric area.  Came on at rest, associated  with severe aching feeling in the subxyphoid area, primarily with  radiation  to both sides of her abdomen and chest.  Also went  retrosternal.  Reglan did help it.  She did not have to have any  nitroglycerin to take.  She has known coronary disease, nonobstructive  previously, with 30% distal left main.  Normal LAD and circumflex artery  and nondominant circumflex showing at 50% ostial stenosis.  There had  been a proximal RCA stenosis of 40%.  EF at that time was 59%.  There  were normal renal arteries.  At one point, she had been on Plavix.  It  had been stopped previously.   PAST MEDICAL HISTORY:  1. Lower extremity cellulitis.  2. Hypertension.  3. Diabetes.  4. Hypothyroidism.  5. Bipolar disorder.  6. History of acid reflux disorder.  7. Chronic pain, previously on methadone.  8. Chronic back pain followed by Dr. Hardin Negus.   ALLERGIES:  No known allergies.   Please note the patient underwent cardiac catheterization in [facility] on December 14, 2006, and was found to have an 80-95% eccentric  aneurysmal-type plaque of the RCA.  She was brought by ambulance to [facility] for intervention.   DISCHARGE PHYSICAL EXAMINATION:  VITAL SIGNS:  Blood pressure 121/59,  pulse 90, respirations 22, temperature 97.7, oxygen saturation room air   96%.  HEART:  Regular rate and rhythm, S1-S2.  LUNGS:  Clear.  ABDOMEN:  Positive bowel sounds.  EXTREMITIES:  Right groin site.  No hematoma.  No drainage, minimal  bruising.  Accu-Cheks were normal at 96-135.   LABORATORY DATA:  Postprocedure, sodium 137, potassium 4.3, BUN 9,  creatinine 0.98, glucose 134, calcium 8.8, platelets initially were 245  prior to her cath.  They were 76 during the night.  Follow up was 83 and  then just prior to discharge, they were back down to 76.   Cardiac enzymes post procedure:  CKs 59 and 131, MBs 1.6-2.4.  Troponin  I 0.06-0.04.   HOSPITAL COURSE:  The patient was admitted from [place] [facility]  secondary to ulcerated plaque of the RCA.  She underwent PTCA and stent  deployment by Dr. Melvern Banker, tolerated procedure well.  Sheath was removed  without complications.  Her platelets did drop during the night on  Integrilin proceeded up to 83, and prior to discharge, they came back  down to 77.  Dr. Elisabeth Cara felt she was stable to be discharged.   She was concerned about the Plavix.  We reassured her and told her the  importance of taking that at home.   She will undergo a thyroid biopsy on Monday.  Dr. Melvern Banker talked to her  ENT physician, and they felt she would tolerate the procedure well.  She  will follow up with Dr. Melvern Banker as stated.      Otilio Carpen. Dorene Ar, N.P.     LRI/MEDQ  D:  12/17/2006  T:  12/18/2006  Job:  [id number]   cc:   Lennette Bihari L. Little, M.D.

## 2010-07-20 NOTE — Cardiovascular Report (Signed)
NAMESUPREET, TIM NO.:  [id number]   MEDICAL RECORD NO.:  [id number]          PATIENT TYPE:  INP   LOCATION:  6529                         FACILITY:  [facility]   PHYSICIAN:  Bryson Dames, M.D.DATE OF BIRTH:  1940-12-10   DATE OF PROCEDURE:  12/14/2006  DATE OF DISCHARGE:                            CARDIAC CATHETERIZATION   PROCEDURES PERFORMED:  1. Selective coronary angiography of the right coronary artery only.  2. Direct stenting of the proximal RCA with reduction of a 75%      stenosis to 0% residual with preservation of TIMI III class distal      flow.   COMPLICATIONS:  None.   ENTRY SITE:  Right femoral.   DYE USED:  Omnipaque.   PATIENT PROFILE:  [name] is a 70 year old obese diabetic smoker who  has known coronary disease and had recent severe chest pain yesterday.  She entered the [facility] cardiac cath lab this morning  and had diagnostic cardiac catheterization which showed basically two-  vessel disease of the distal left main and LAD which was 40-50% but did  show a 75% ulcerated plaque in the RCA which was felt to be the culprit  vessel responsible for her recent angina.  We had her arterial sheath  sewn in her right groin at the [facility] which was a 4-  [place] sheath.  We had the patient transferred to [facility],  and before transfer, she was given 5000 units of heparin.  We then  brought the patient to the cath lab this afternoon and performed direct  coronary artery stenting using 600 mg of Plavix, Integrilin, heparin,  and for sedation the patient was given fentanyl and Versed.   The guide catheter used for the procedure was a 6-French Cordis extra  backup right coronary guide catheter with a 3.5 tip.  The wire used was  a [employer] applications which was an excellent choice.  The vessel was direct  stented using a 3.0 Liberte stent by [employer].  We dilated the  stent with the stent balloon  to approximately 14 atmospheres of  pressure.  We then used a Cordis Dura Star balloon to post dilate and  went up to 19 atmospheres of pressure.  No complications occurred.  The  stenosis was reduced from 75% to 0, and TIMI III flow was preserved.   FINAL DIAGNOSIS:  Successful direct stenting of the proximal right  coronary artery from 75% to 0% residual stenosis.   PLAN:  The patient will be recovered in the cath lab holding area, and  she will spend the night in the unit 6500 and be eligible for discharge  tomorrow.  We will continue Plavix for at least a month, and hopefully  she will be able to tolerate it longer.  My goal would be 1 year if  possible.           ______________________________  Bryson Dames, M.D.     WHG/MEDQ  D:  12/14/2006  T:  12/15/2006  Job:  [id number]   cc:  [facility]  Lennette Bihari L. Little, M.D.

## 2010-07-20 NOTE — Cardiovascular Report (Signed)
[name], [name] NO.:  [id number]   MEDICAL RECORD NO.:  [id number]          PATIENT TYPE:  OIB   LOCATION:  2899                         FACILITY:  [facility]   PHYSICIAN:  Bryson Dames, M.D.DATE OF BIRTH:  Jul 31, 1940   DATE OF PROCEDURE:  DATE OF DISCHARGE:                            CARDIAC CATHETERIZATION   PROCEDURES PERFORMED:  1. Selective coronary angiography by Judkins' technique.  2. Retrograde left heart catheterization.  3. Left ventricular angiography.  There were no percutaneous      interventions performed today   COMPLICATIONS:  None.   ENTRY SITE:  Right femoral.   DYE USED:  Omnipaque.   PATIENT PROFILE:  Ms. [name] is a 70 year old woman with diabetes  mellitus, obesity, and previous right coronary artery stenting performed  on December 14, 2006.  The patient has been on Plavix, aspirin and a  variety of medications and recently underwent a outpatient cardiac  functional stress test, which showed what was felt to be new anterior  wall ischemia.  Because of her obesity, diabetes, hyperlipidemia and  known coronary disease, it was felt that she should undergo repeat  catheterization.  Today's procedure was performed uneventfully and  without complications.   RESULTS:  Pressures:  The pressure data on this patient was not actually  available at the time of this dictation.  They are reported separately  and kept in the patient's cath lab folder.  By my recollection, they  were normal pressures, both LV systolic and end-diastolic pressures.   ANGIOGRAPHIC RESULTS:  The patient's left main coronary artery contained  tubular calcification in the distal one-half of the vessel, on the  inferior aspect of the distal left main.  There was smooth left main  tapering, which amounted to somewhere about 30% to 40%.  The left main  itself was a 4.5-mm vessel.   LAD coursed to the cardiac apex and was essentially of normal vessel and  gave rise to a  normal diagonal.   There were actually two diagonal branches, there was a intermediate  ramus branch arising from the left main coronary artery that was normal.  The circumflex branch showed an ostial hazy area that was not felt to be  high-grade stenosed.  It was hard to visualize this area, because of the  trifurcation anatomy of this vessel and the LAD and left main.   The right coronary artery had mild calcification proximally, extending  down into the midportion of the vessel as well.  There was a radio-  opaque stent located in the proximal RCA that was patent.  There were  luminal irregularities in this calcified proximal RCA, but nothing that  was felt to be obstructive.  This was a dominant vessel.   Left ventricular angiogram showed vigorous contractility of all wall  segments, and I would estimate ejection fraction of 60%, with no  regional wall motion abnormalities, no LV thrombus, no mitral  regurgitation.  As previously stated, no percutaneous intervention was  performed today.   FINAL IMPRESSIONS:  1. A 30-40% distal left main stenosis, and it also  probably involves      the ostium of the left anterior descending, smooth and unchanged      from the last tracing, last cath.  2. There is a an ostial stenosis of a circumflex branch that is a non-      dominant vessel.  It amounts to somewhere about 40% to 50% as well.   PLAN:  Medical therapy.  The patient will need smoking cessation  consultation because of her already existing obesity, diabetes,  hyperlipidemia, and now we need to ensure that she quit smoking to  reduce the chances of worsening of this distal left main ostial LAD mild  stenosis at present.           ______________________________  Bryson Dames, M.D.     WHG/MEDQ  D:  05/04/2007  T:  05/04/2007  Job:  [id number]   cc:   Lennette Bihari L. Little, M.D.  [facility]  Bryson Dames, M.D.
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
## 2010-07-23 NOTE — Op Note (Signed)
NAMESRI, BODIFORD                  ACCOUNT NO.:  [id number]   MEDICAL RECORD NO.:  [id number]          PATIENT TYPE:  AMB   LOCATION:  SDS                          FACILITY:  [facility]   PHYSICIAN:  Onnie Graham, MD     DATE OF BIRTH:  Jul 29, 1940   DATE OF PROCEDURE:  02/17/2005  DATE OF DISCHARGE:  02/17/2005                                 OPERATIVE REPORT   PREOPERATIVE DIAGNOSIS:  Right anterior vocal cord leukoplakia.   POSTOPERATIVE DIAGNOSIS:  Right anterior vocal cord leukoplakia.   PROCEDURE:  Suspended micro-direct laryngoscopy with excisional biopsy of  right anterior vocal cord leukoplakia.   SURGEON:  Onnie Graham, MD   ANESTHESIA:  General endotracheal.   COMPLICATIONS:  None.   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 Her voice has  changed somewhat recently and examination reveals a new leukoplakia lesion  of the right anterior vocal cord. She presents to the operating room for  surgical management.   FINDINGS AT SURGERY:  A 2 mm leukoplakic lesion was present on the right  anterior vocal cord, just posterior from the anterior commissure. It is  mostly on the superior surface of the vocal cord, but the medial edge does  encroach upon the medial surface of the vocal cord. With excision, the  underlying tissue appears normal and the leukoplakic lesion elevated off  easily.   DESCRIPTION OF PROCEDURE:  The patient was identified in the holding area.  With informed consent having been obtained, the patient was moved from the  operative  suite to the operating room table in the supine position.  Anesthesia was induced. The patient was intubated by anesthesia team without  difficulty. The nares were taped closed and the bed was turned degrees from  anesthesia. The patient was given intravenous  Decadron during the case. A  damp gauze was placed over the upper gum, and the larynx was exposed using a  Guedel laryngoscope. The laryngoscope was then placed in suspension using a  Lewy arm on a Mayo stand. The operating microscope was then brought into the  field and used to take a photography of the preoperative site. A 1:1000  epinephrine-soaked pledget was then held against the lesion for a couple of  minutes. A sickle knife was then used to make an incision lateral to the  lesion, and the lesion was then grasped with a cup forceps. An attempt was  made to elevate under the lesion, but the cup forceps removed the lesion and  a couple of fragments without any difficulty. The underlying lamina propria  was kept intact and the vocal ligament was not seen during the case. With  complete excision, an epinephrine pledget was again held against the lesion  and photographs were then taken. The fragments were first passed to nursing  for  pathology. At this point, 2% lidocaine was sprayed onto the vocal cords, and  the  larynx and throat were suctioned out.  The laryngoscope was taken out of  suspension and removed from the patient's mouth. She was then turned back to  the anesthesia for wake-up, and was extubated in the recovery room in stable  condition.      Onnie Graham, MD  Electronically Signed     DDB/MEDQ  D:  02/17/2005  T:  02/18/2005  Job:  [id number]

## 2010-07-23 NOTE — Op Note (Signed)
NAME:  [name], [name]                  ACCOUNT NO.:  [id number]   MEDICAL RECORD NO.:  [id number]          PATIENT TYPE:  AMB   LOCATION:  SDC                           FACILITY:  [facility]   PHYSICIAN:  Katharine Look A. Rivard, M.D. DATE OF BIRTH:  10-May-1940   DATE OF PROCEDURE:  10/19/2004  DATE OF DISCHARGE:                                 OPERATIVE REPORT   PREOPERATIVE DIAGNOSIS:  Abnormal endometrium on ultrasound.   POSTOPERATIVE DIAGNOSES:  1.  Abnormal endometrium on ultrasound.  2.  Submucosal fibroid.   PROCEDURE:  Hysteroscopy, dilatation and curettage.   SURGEON:  Dede Query. Rivard, M.D.   ESTIMATED BLOOD LOSS:  Minimal.   PROCEDURE:  After being informed of the planned procedure with possible  complications including bleeding, infection and injury to uterus, informed  consent was obtained.  The patient is taken to OR #8, given general  anesthesia with laryngeal mask without complication.  She is placed in the  lithotomy position, prepped and draped in a sterile fashion, and her bladder  is emptied with an in-and-out red Foley.  GYN exam reveals an anteverted  uterus, small adnexa are nonpalpable.  A weighted speculum is inserted,  anterior lip of cervix is grasped with a tenaculum forceps, and we perform a  paracervical block using 20 mL of Nesacaine 1% in the usual fashion.  The  uterus is then sounded at 7 cm and the cervix is easily dilated using Hegar  dilator at #29.  This allows easy entry of the diagnostic hysteroscope and  with sorbitol 3% at a maximum pressure of 100 mmHg, we are able to visualize  the entire uterine cavity, which reveals a diffusely atrophic endometrium  and a small submucosal fibroid on the posterior wall near the fundus  measuring approximately 1 cm.  This would explain the abnormal finding on  ultrasound.  Hysteroscope is then removed and a sharp curette is used to remove a limited  amount of normal-appearing endometrium, sent for pathology  report.   Instruments are removed.  Instrument and sponge count is complete x2.  Estimated blood loss is minimal.  Fluid deficit is -20 mL.  The procedure is  well tolerated by the patient, who is taken to recovery room in a well and  stable condition.      Dede Query Rivard, M.D.  Electronically Signed     SAR/MEDQ  D:  10/19/2004  T:  10/19/2004  Job:  [id number]

## 2010-07-23 NOTE — Op Note (Signed)
[facility]  Patient:    [name], [name]                         MRN: [id number] Proc. Date: 07/12/00 Adm. Date:  [id number] Attending:  Kathleen Lime                           Operative Report  PREOPERATIVE DIAGNOSIS:  Recurrent leukoplakia, right true vocal cord.  POSTOPERATIVE DIAGNOSIS:  Pending histological evaluation, suspect carcinoma in situ,  OPERATION PERFORMED:  Microlaryngoscopy and stripping of right true vocal cord.  SURGEON:  Windell Moment, M.D.  ANESTHESIA:  General orotracheal.  DESCRIPTION OF PROCEDURE:  With the patient under general orotracheal anesthesia, the laryngoscope was introduced.  Both vallecula were clear.  Both piriform sinuses were clear.  There was no significant postcricoid edema present.  Interior larynx visualized and the left true vocal cord was completely normal to inspection and there was significant irregular leukoplakic changes of mucosa of the superior free margin and inferior margin of the anterior two thirds of the right true vocal cord.  It did not extend entirely to the anterior commissure.  Under visualization with the laryngoscope suspended and under visualization with the operating microscope, photodocumentation was obtained.  The right true vocal cord was then manually stripped with cup forceps completely exposing the normal vocalis muscle and all remnants of hyperplastic leukoplakic mucosa stripped away.  It stripped partially fairly smoothly but some areas only came away by biting. ____________ histologically represents probably a carcinoma in situ if not an extremely early invasive carcinoma.  The patient tolerated the procedure well and was taken to the recovery room in stable general condition. DD:  07/12/00 TD:  07/12/00 Job: [id number]

## 2010-07-23 NOTE — H&P (Signed)
[name], [name] NO.:  [id number]   MEDICAL RECORD NO.:  [id number]          PATIENT TYPE:  INP   LOCATION:  5023                         FACILITY:  [facility]   PHYSICIAN:  Wenda Low, MD      DATE OF BIRTH:  03/02/1941   DATE OF ADMISSION:  09/18/2004  DATE OF DISCHARGE:                                HISTORY & PHYSICAL   PRIMARY CARE PHYSICIAN:  Lennette Bihari L. Little, M.D., at [facility], [facility].   CHIEF COMPLAINT:  Leg swelling and redness, right leg worse than the left.   HISTORY OF PRESENT ILLNESS:  The patient is a 70 year old female with a  history of diabetes, hypertension, lower extremity swelling, hypothyroidism,  and bipolar comes in complaining of this redness getting worse and some  pain.  The patient went to the walk-in clinic today and was seen twice in a  row for the same problem today which looked a little bit worse up there and  was sent to the emergency room.  Her white count initially up there earlier  in the day was normal.  She had no fever, but she did have chills.  Her pain  and swelling is a little more worse on the right leg than the left.  She  reportedly has been seen in the office about a week ago by Dr. Leonides Schanz and  was told that she had cellulitis and was given a shot in the office, an  antibiotic, and started on doxycycline pills.  She went back to followup in  three days.  She said she probably was getting better, was told to continue  the pills.  Denies any previous redness.  No insect bite, no shortness of  breath or chest pain.  Sugar has been well controlled.   PAST MEDICAL HISTORY:  1.  Type 2 diabetes, non-insulin dependent.  2.  Hypertension.  3.  She had a cath in 2001, followed by Dr. Melvern Banker, non-obstructive coronary      disease.  4.  Hypothyroidism.  5.  Bipolar.  6.  Tobacco use.  7.  History of anemia, mild, but colonoscopy in 2004, was negative.  8.  DJD.  9.  Acid reflux disease.  10. Chronic back pain followed by Dr. Hardin Negus.  11. Chronic pain management, on methadone.   ALLERGIES:  No known drug allergies.   MEDICATIONS:  1.  Plavix 75 mg daily.  2.  Lexapro 20 mg 1-1/2 tablets daily.  3.  Actos 45 mg daily.  4.  Synthroid 0.05 mg daily.  5.  Prevacid 30 mg daily.  6.  Methadone 5 mg four tablets a day.  7.  Cymbalta 60 mg daily.  8.  Pravachol 20 mg daily.  9.  Xanax 1 mg t.i.d.  10. Lasix 40 mg three tablets daily.  11. Aspirin 81 mg daily.  12. Potassium 10 mEq daily.   PAST SURGICAL HISTORY:  1.  Rotator cuff repair.  2.  Cystocele repair.  3.  Cholecystectomy.   SOCIAL HISTORY:  Tobacco, one pack a day.  Alcohol, none.  She is married.   PHYSICAL EXAMINATION:  VITAL SIGNS:  Blood pressure was __________/50, pulse  57, 97% saturations on room air, temperature 96.6.  GENERAL:  Awake, alert, no acute distress.  LUNGS:  Clear.  CARDIAC:  Normal sinus rhythm.  EXTREMITIES:  Right leg with some redness, swelling, and mildly hot to touch  on the lower leg and some redness on the toe, 1+ edema.  Left leg had trace  redness without much cellulitic appearance.  No red streaking.   LABORATORY DATA:  White count 13.2, hemoglobin 12.6, platelets 215.  Chemistries:  Sodium 141, potassium 3.7, BUN 12, creatinine 0.8, glucose  110.  LFT's were normal.   IMPRESSION:  A 70 year old with a history of diabetes, hypertension,  hypothyroidism, with right leg swelling on p.o. antibiotic, doxycycline,  with elevated white count and chills.  1.  Cellulitis of the right leg.  Intravenous Zosyn.  We will get blood      culture, follow CBC, elevate the leg, continue diuretics.  2.  Hypertension.  Continue on current medications.  3.  Hypothyroidism.  Continue on current medications.  4.  Chronic pain syndrome, on methadone.  Continue that.  5.  Diabetes.  Continue Actos and I will get her on some intravenous fluids      of normal saline.       KH/MEDQ  D:   09/18/2004  T:  09/19/2004  Job:  [id number]   cc:   Lennette Bihari L. Little, M.D.  [address]  [place]  [place] [address]  Fax: [phone]

## 2010-07-23 NOTE — Discharge Summary (Signed)
[place]. [facility]  Patient:    [name], [name]                         MRN: [id number] Adm. Date:  [id number] Disc. Date: [id number] Attending:  Octavia Heir Dictator:   Alroy Bailiff, P.A. CC:         Priscille Heidelberg. Little, M.D.             Bryson Dames, M.D. - [facility]                           Discharge Summary  ADMISSION DIAGNOSES:  1. Unstable angina.  2. Hypertension.  3. Non-insulin dependent diabetes mellitus.  4. Hypolipidemia.  5. Tobacco use.  DISCHARGE DIAGNOSES:  1. Unstable angina.  2. Hypertension.  3. Non-insulin dependent diabetes mellitus.  4. Hypolipidemia.  5. Tobacco use.  6. Status post cardiac catheterization on August 26, 1999, by Dr. Alla German.  HISTORY OF PRESENT ILLNESS:  [name] is a 70 year old white female with a history of hypertension, non-insulin dependent diabetes mellitus, hyperlipidemia, tobacco use with no known CAD status post negative cardiolite 3 years ago.  She presented to [facility] Emergency Room on June 21,2001 with complaints of chest pain.  On that previous Tuesday which was 2 days prior, she had the onset of chest pain.  It was a left chest pain that was "deep inside" and "felt like a pinch that gets bigger and bigger".  It was constant.  As well she had a cramp in her back.  Her left upper arms felt tired and achy.  Her left neck pain had been present for years and she had had no increase or change in this neck pain.  She had no associated shortness of breath, diaphoresis, nausea or vomiting.  Her symptoms have lasted 5-15 seconds at the time.  They may return within 5-30 minutes.  She has been unable to sleep for the past 2 nights secondary to pain.  At walk on the morning of admission she had the same symptoms.  She wanted to see her primary care physician, Dr. Rex Kras, for medications of pain relief.  In his office she was given nitroglycerin sublingually.   She continued to have episodic chest pain even with nitroglycerin.  The pain did not increase with exertion.  She had had this type of chest pain in the past but episodes did not continue as long as it has at this time.  On exam at that time her heart rate was 75, blood pressure 123/64.  She had no chest wall tenderness with palpation.  EKG revealed normal sinus rhythm 72 beats per minute with no ST or T wave changes.  At that time enzymes were pending and EKG was without change.  She was planned for admission to telemetry for monitoring and to check serial enzymes to rule out MI.  Given her cardiac risk factors of hyperlipidemia, hypertension, diabetes mellitus, and tobacco use, it was felt that cardiac catheterization was warranted.  HOSPITAL COURSE:  On August 26, 1999, [name] underwent cardiac catheterization by Dr. Alla German.  Please see his catheterization report for further details.  He found the following:  Left main trunk was a small left main with 30% distal stenosis and calcium in the distal left main.  LAD:  30% ostial lesion.  40% mid  LAD lesion.  First OM had no significant disease.  Left circumflex:  No significant disease.  OM had no significant disease.  RCA was dominant.  This had a 30% lesion in the mid segment.  PDA had no significant disease.  PLA had a 70% stenosis in the upper bifurcation.  There was 50% stenosis in the lower bifurcation.  EF was estimated at 50%.  Dr. Tami Ribas then used ______ to assess the LAD.  This revealed a proximal LAD, thus diameters of 3.0 x 3.5 and a luminal diameter of 2.5 x 3.0.  There was diffuse calcification.  The distal left main had a diameter of 3.5 x 3.0 with a luminal diameter of 3.0 x 3.0.  Again there was diffuse calcification.  The proximal left main had a diameter of 4.0 x 4.5 and the luminal diameter was 3.5 x 4.0.  No significant stenosis.  This reveals diffuse calcification but no high grade stenosis.  The  lumen appeared patent.  It was felt that the posterolateral system did have significant disease but it was a small vessel and did not appear optimal for PCI.  No intervention was necessary.  On the morning of August 27, 1999, [name] was still complaining of fleeting chest pain.  It was in her left chest and lasts for seconds.  The catheterization findings were reviewed.  Her lungs were clear.  Heart was in regular rhythm.  She had no edema and her groin was stable.  Enzymes were all negative and labs were stable.  She was planned for ambulation.  We would start a beta-blocker and nitroglycerin and celebrex.  We will plan for discharge home.  HOSPITAL CONSULTS:  None.  HOSPITAL PROCEDURES:  1. Cardiac catheterization on August 26, 1999, by Dr. Alla German.  Please     see hospital course for the findings of the catheterization.  He performed     diagnostic cardiac catheterization in addition to ______.  No     intervention was warranted.  HOSPITAL LABORATORY DATA:  TSH normal at 2.123.  Lipid profile revealed a cholesterol of 165, triglyceride 175, HDL 30, LDL 100. Cardiac enzymes revealed CKs of 133, 87, and 74.  CK-MBs were 2.1, 1.0 and 0.6. Troponin is 0.06, 0.03, and less than 0.03.  Metabolic profile is normal with sodium 136, potassium 3.5, glucose 150, BUN 15, creatinine 0.8, LFTs are normal.  CBC is also normal with WBCs of 6.4, hemoglobin 14.3, hematocrit 41.3.  Platelets 258.  Chest x-ray on August 26, 1999, reveals no active lung disease.  EKG reveals normal sinus rhythm 84 beats per minute.  No ST or T wave changes.  DISCHARGE MEDICATIONS:  1. Imdur 30 mg 1 p.o. q.d.  2. Toprol XL 25 mg q. day.  3. Celebrex 200 mg 1 q. day.  4. Prinivil 10 mg 1 q. day.  5. Plavix 75 mg 1 q. day.  6. Prempro as before.  7. Maxzide 75 mg 1 p.o. q. day.  8. Amaryl 2 mg 1 p.o. q. day.  9. Enteric-coated aspirin 325 mg 1 p.o. q. day. 10. Prevacid 30 mg 1 p.o. q. day. 11. Wellbutrin  150 mg b.i.d. 12. Allegra 1 p.o. q. day. 13. Darvocet-N 100 1-2 q.4-6h. as needed.   ACTIVITY:  No strenuous activity, lifting greater than 5 pounds or driving for 2 days.  DIET:  Low salt, low fat low cholesterol, diabetic diet.  WOUND CARE:  May shower, do not soak in the tub or swim for  1 week.  FOLLOWUP:  Call our office at [phone] if any problems or questions.  Followup with Dr. Otilio Carpen. Ingold, F.N.P.C. on September 16, 1999, at 10:40.  Followup with Dr. Melvern Banker, December 30, 1999 at 11:30 a.m. DD:  09/10/99 TD:  09/10/99 Job: [id number]

## 2010-07-23 NOTE — Op Note (Signed)
   NAME:  BULAH, COMPTON                            ACCOUNT NO.:  [id number]   MEDICAL RECORD NO.:  [id number]                   PATIENT TYPE:  OIB   LOCATION:  2853                                 FACILITY:  [facility]   PHYSICIAN:  Windell Moment, M.D.                   DATE OF BIRTH:  September 01, 1940   DATE OF PROCEDURE:  11/18/2002  DATE OF DISCHARGE:                                 OPERATIVE REPORT   PREOPERATIVE DIAGNOSIS:  Recurrent leukoplakia, right vocal cord.  History  of carcinoma in situ.   POSTOPERATIVE DIAGNOSIS:  Recurrent leukoplakia, right vocal cord.  History  of carcinoma in situ.  Pending histological confirmation.   OPERATION PERFORMED:  Micro direct laryngoscopy and stripping of right vocal  cord.   SURGEON:  Windell Moment, M.D.   ANESTHESIA:  General orotracheal anesthesia.   DESCRIPTION OF PROCEDURE:  With the patient under general orotracheal  anesthesia the laryngoscope was introduced.  Both valleculae, the  epiglottis, piriform sinuses were all clear to inspection.  The anterior  larynx was inspected.  The patient had heaped up whitish leukoplakic changes  involving the anterior half of the right vocal cord.  The scope was  suspended.  Unde microscopic visualization, all areas of leukoplakia were  completely stripped down to normal submucosal tissues and adjacent normal  mucosa.  1:1000 Adrenalin cottonoid was applied briefly for hemostasis.  There was no significant bleeding.  The patient tolerated the procedure well  and was taken to the recovery room in stable general condition.                                                Windell Moment, M.D.    Rulon Abide  D:  11/18/2002  T:  11/18/2002  Job:  [id number]

## 2010-07-23 NOTE — Discharge Summary (Signed)
NAMEJULIE-ANNE, Bradley NO.:  [id number]   MEDICAL RECORD NO.:  [id number]          PATIENT TYPE:  INP   LOCATION:  5023                         FACILITY:  [facility]   PHYSICIAN:  Jerelene Redden, MD      DATE OF BIRTH:  January 22, 1941   DATE OF ADMISSION:  09/18/2004  DATE OF DISCHARGE:  09/20/2004                                 DISCHARGE SUMMARY   [name] is a 70 year old lady who initially presented on July 15 with a  three-day history of increased lower leg discomfort initially involving the  right leg, subsequently involving the left leg.  Prior to her admission she  had received doxycycline and had also received an injection of an  antibiotic, possibly Rocephin.  The patient was initially admitted by Dr.  Wenda Low.  On physical examination her temperature was 96, blood  pressure 109/50, pulse 57, respirations 18.  The main finding was the  evaluation of her lower extremities.  Her right leg had a generalized  erythema consistent with cellulitis extending from the ankle up to an area  just below the knee.  The left leg also had a mildly reddened appearance.  Relevant laboratory studies obtained included a CBC which revealed a white  count of 13,200.  Sodium was 141, potassium 3.7, creatinine 0.8, BUN 12.  Liver profile was normal.  Blood cultures were negative.  The patient  remained afebrile during the course of her hospitalization.  On admission  she was placed on Zosyn 3.375 g IV every six hours.  Her other medications  were continued.  On July 16 her examination revealed the healing ulceration  of her great toe.  It also showed redness of the distal right leg which  according to the patient was improved.  The left leg also had mild distal  redness.  On July 17 the patient had a venous Doppler study done which was  absolutely negative.  By July 17 the patient had a really quite amazing  improvement in the appearance of her legs.  Her legs appeared absolutely  normal by the time of my evaluation 7:17 a.m.  There was no redness, no  tenderness, no warmth, no swelling, no indication of cellulitis.  I  therefore concluded that the patient had a rather dramatic response to the  intravenous Zosyn.  I therefore advised her that we could proceed with  discharge and the patient was discharged on July 17.   DISCHARGE DIAGNOSES:  1.  Cellulitis of the lower extremity.  2.  Hypertension.  3.  Diabetes.  4.  Chronic pain.  5.  Hypothyroidism.  6.  Bipolar disorder.   DISCHARGE MEDICATIONS:  1.  The patient was advised to take Augmentin 875 mg p.o. b.i.d. with food      for seven days.  2.  She was also given Diflucan 150 mg to take for a yeast infection      involving the vaginal area.  3.  She was advised to continue her usual medications which consist of      Plavix 75 mg daily,  Lexapro 30 mg daily, Actos 45 mg daily, Synthroid      0.05 mg daily, Prevacid 30 mg daily, methadone 5 mg q.i.d., Cymbalta 60      mg daily, Pravachol 20 mg daily, Xanax 1 mg t.i.d., Lasix 120 mg daily,      aspirin 81 mg daily, potassium 10 mEq daily.   She was encouraged to follow up with Dr. Rex Kras.   CONDITION ON DISCHARGE:  Good.       SY/MEDQ  D:  09/20/2004  T:  09/20/2004  Job:  [id number]   cc:   Lennette Bihari L. Little, M.D.  [address]  [place]  [place] [address]  Fax: [phone]

## 2010-07-23 NOTE — Op Note (Signed)
   NAME:  [name], [name]                            ACCOUNT NO.:  [id number]   MEDICAL RECORD NO.:  [id number]                   PATIENT TYPE:  AMB   LOCATION:  ENDO                                 FACILITY:  [facility]   PHYSICIAN:  Jeryl Columbia, M.D.                 DATE OF BIRTH:  1940/08/31   DATE OF PROCEDURE:  04/08/2002  DATE OF DISCHARGE:                                 OPERATIVE REPORT   PROCEDURE:  Colonoscopy.   INDICATIONS FOR PROCEDURE:  Iron deficiency. Consent was signed after risks,  benefits, methods, and options were thoroughly discussed in the office.   PREMEDICATION:  Demerol 80, Versed 10.   DESCRIPTION OF PROCEDURE:  Rectal inspection was pertinent for external  hemorrhoids.  Small digital examination was made.  Pediatric video  adjustable colonoscope was inserted and fairly easily, despite a tortuous  looping colon, was able to be advanced to the cecum and this did require  rolling her on her back and some abdominal pressure.  The cecum was  identified by the appendiceal orifice and the ileocecal valve and no  significant abnormality or signs of bleeding was seen on exertion. The scope  was then slowly withdrawn. The prep was adequate. There was some liquid  stool that required washing and suctioning.  On slow withdrawal through the  colon, the cecum, ascending, transverse, and descending were normal. The  scope was withdrawn around the sigmoid, some tiny rectal and distal sigmoid  hyperplastic appearing polyps were seen and were cold biopsied, and put in  the first container.  Once back in the rectum, the scope was retroflexed  pertinent for some internal hemorrhoids.  No other abnormalities were seen.  The scope was straightened and readvanced a short ways up the left side of  the colon. Air was suctioned and the scope removed. The patient tolerated  the procedure well. There was no obvious immediate complications.   ENDOSCOPIC ASSESSMENT:  1. Internal and  external small hemorrhoids.  2. Tiny hyperplastic appearing rectal distal sigmoid polyps cold biopsied.  3. Otherwise within normal limits to the cecum.   PLAN:  Await pathology to determine future colonic screening.  Continue  workup with an EGD.  Please see that dictation for other workup plans and  recommendations.                                               Jeryl Columbia, M.D.    MEM/MEDQ  D:  04/08/2002  T:  04/08/2002  Job:  [id number]   cc:   Lennette Bihari L. Little, M.D.  [address]  [place]  [place] [address]  Fax: [phone]

## 2010-07-23 NOTE — Procedures (Signed)
[place]. [facility]  Patient:    [name], [name]                         MRN: [id number] Proc. Date: 08/26/99 Adm. Date:  [id number] Disc. Date: [id number] Attending:  Octavia Heir CC:         Bryson Dames, M.D.                           Procedure Report  PROCEDURES: 1. Left heart catheterization. 2. Coronary angiography. 3. Left ventriculogram. 4. Left Main/LAD    Intravascular ultrasound imaging.  COMPLICATIONS:  None.  INDICATIONS:  Ms. Gaschler is a 70 year old white female with a history of hypertension, non-insulin-dependent diabetes mellitus, dyslipidemia, history of tobacco abuse.  She presented to the ER on August 26, 1999 complaining of 48 hours of intermittent substernal chest pain with occasional migration to her left arm.  she had no associated shortness of breath or diaphoresis.  She had a history of a negative Cardiolite approximately three years ago.  She did use some sublingual nitroglycerin, which provided some relief; however the symptoms returned.  She is now referred for a cardiac catheterization.  DESCRIPTION OF PROCEDURE:  After giving informed written consent, the patient was brought to the cardiac catheterization lab.  Her right and left groins were shaved, prepped and draped in the usual sterile fashion.  ECG monitoring was established.  Using modified Seldinger technique, a 6-French arterial sheath was inserted in the right femoral artery.  Six-French diagnostic catheters were then used to perform diagnostic angiography.   DIAGNOSTIC ANGIOGRAPHY: 1. Left Main Trunk:  Small left main with 30% distal stenosis and calcium in    the distal left main. 2. Left Anterior Descending:  A medium-sized vessel which coursed to the apex,    and gave rise to one diagonal branch.  The LAD had 30% ostial lesion, which    appeared to be calcified.  There was 40% mid LAD lesion.  The first    diagonal was a medium-sized vessel, with no  significant disease.   This    also gave rise to a small ramus intermedius and no significant disease. 3. Left Circumflex:  A small vessel which coursed in the AV groove; gave rise    to one small obtuse marginal.  The AV circumflex, as well as the first    OM had no significant disease. 4. Right Coronary Artery:  A medium-sized vessel which is dominant and gives    rise to both a PDA as well as a posterolateral branch.  The RCA had a 30%    stenotic lesion in its mid segment.  The PDA was a small vessel with no    significant disease.  The PLA was a medium-sized vessel which bifurcates    in its mid portion.  It was noted to have a 70% stenotic in the upper    bifurcation, which was a 1.5 to 2.0 vessel.  There was a 50% stenotic    lesion in the lower bifurcation.  LEFT VENTRICULOGRAM:  Revealed preserved EF of approximately 50%.  There is a significant amount of ectopy and MR could not be quantitated.  HEMODYNAMICS: 1. Systemic arterial pressure:  140/75. 2. Left ventricular systemic pressure:  140/16. 3. Left ventricular end-diastolic pressure:  18.  INTERVENTIONAL PROCEDURE:  (LEFT MAIN/LAD -- IVUS) -- Following diagnostic angiography, a 6-French JL4 guiding catheter  was collectively engaged in the left coronary ostium.  Next, a short 0.014 Patriot guide wire was advanced by the guiding catheter to the proximal LAD.  The guide wire was advanced into the distal LAD without difficulty.  Next, IVUS ultrasound imaging catheter was advanced into the distal/proximal LAD.  The mechanical pullback was then performed throughout the proximal LAD and the entire length of the left main.  This revealed a proximal LAD vessel diameter of 3.0 x 3.5, and a luminal diameter of 2.5 x 3.0; as well as diffuse calcification.  The distal left main had a vessel diameter of 3.5 x 3.0, with a luminal diameter of 3.0 x 3.0; again with diffuse calcification.  The proximal left main vessel diameter was 4.0 x  4.5, luminal diameter 3.5 x 4.0. There was no significant stenosis noted.  Follow up angiogram revealed no evidence of dissection or thrombus, with TIMI-3 flow to the distal vessel.  The patient had been given a 5000 unit heparin bolus prior to the IVUS.  CONCLUSIONS: 1. Successful intravascular ultrasound imaging of the proximal LAD and    left main; revealing diffuse calcification but no high-grade stenosis.    There is a small distal left main.  However, the lumen appears patent. 2. Significant disease of the posterolateral system.  However, this is a    small vessel and does not appear to be optimal for percutaneous    intervention. 3. Normal LV systolic function.DD:  08/26/99 TD:  08/29/99 Job: [id number]

## 2010-07-23 NOTE — Op Note (Signed)
[place]. [facility]  Patient:    [name], [name]                         MRN: [id number] Proc. Date: 09/15/99 Adm. Date:  [id number] Attending:  Kathleen Lime                           Operative Report  PREOPERATIVE DIAGNOSIS:  Leukoplakia, right vocal cord.  POSTOPERATIVE DIAGNOSIS:  Leukoplakia, right vocal cord, plus small retention cyst, left ventricle.  OPERATION PERFORMED:  Microlaryngoscopy and vocal cord stripping.  SURGEON:  Windell Moment, M.D.  ANESTHESIA:  General orotracheal.  DESCRIPTION OF PROCEDURE:  With the patient under general orotracheal anesthesia, direct laryngoscopy was performed, showing both vallecula.  The epiglottis was normal on both glottic and laryngeal surfaces.  Both piriform sinus areas were completely clear to inspection. DD:  09/15/99 TD:  09/15/99 Job: [id number]

## 2010-07-23 NOTE — Op Note (Signed)
NAME:  [name], [name]                            ACCOUNT NO.:  [id number]   MEDICAL RECORD NO.:  [id number]                   PATIENT TYPE:  AMB   LOCATION:  ENDO                                 FACILITY:  [facility]   PHYSICIAN:  Jeryl Columbia, M.D.                 DATE OF BIRTH:  1940/12/01   DATE OF PROCEDURE:  04/08/2002  DATE OF DISCHARGE:                                 OPERATIVE REPORT   PROCEDURE:  Esophagogastroduodenoscopy with biopsy.   ENDOSCOPIST:  Jeryl Columbia, M.D.   INDICATION:  Iron deficiency with a nondiagnostic colonoscopy.   INFORMED CONSENT:  Consent was signed after risks, benefits, methods and  options were thoroughly discussed in the office.   MEDICATIONS USED:  Demerol 20 mg additionally only, since this procedure  followed the colonoscopy.   DESCRIPTION OF PROCEDURE:  The video endoscope was inserted by direct  vision.  The proximal and mid-esophagus was normal.  In the distal esophagus  was a minimal amount of esophagitis with maybe some small erosions and a  small hiatal hernia.  Scope passed into the stomach, advanced into the  antrum, pertinent for some mild-to-moderate antritis, advanced through a  normal pylorus into a normal duodenal bulb and around the C loop to a normal  second portion of duodenum.  Scope was withdrawn back to the bulb and a good  look there ruled out abnormalities in that location.  Scope was withdrawn  back to the stomach and retroflexed.  Angularis, cardia, fundus, lesser and  greater curve were normal on retroflexed visualized.  Straight visualization  of the stomach did not reveal any additional findings.  We went ahead and  took some biopsies of the antrum and put them in one container.  On slow  withdrawal through the esophagus, confirmed above findings.  We went ahead  and took some biopsies of the distal esophagus, just to rule out any  microscopic Barrett's and confirm the esophagitis, and they were put in a  separate  container.  Air was suctioned and scope slowly withdrawn.  Again, a  good look at the proximal and mid-esophagus was normal, the scope removed.  The patient tolerated the procedure adequately.  There was no obvious  immediate complication.   ENDOSCOPIC DIAGNOSES:  1. Small hiatal hernia with some distal esophagitis and erosions, status     post biopsy.  2. Mild-to-moderate antritis, status post biopsy.  3. Otherwise normal esophagogastroduodenoscopy.    PLAN:  Await pathology.  Follow up p.r.n. or in six to eight weeks to  recheck guaiac, CBC and decide any other workup and plan like a small-bowel  follow-through, CT or even capsule endoscopy.  Jeryl Columbia, M.D.    MEM/MEDQ  D:  04/08/2002  T:  04/08/2002  Job:  [id number]   cc:   Lennette Bihari L. Little, M.D.  [address]  [place]  [place] [address]  Fax: [phone]

## 2010-07-23 NOTE — Op Note (Signed)
[place]. [facility]  Patient:    WLADYSLAWA, KERNER                         MRN: [id number] Proc. Date: 09/15/99 Adm. Date:  [id number] Attending:  Kathleen Lime                           Operative Report  PREOPERATIVE DIAGNOSIS: Leukoplakia, right vocal cord.  POSTOPERATIVE DIAGNOSES: 1. Leukoplakia, right vocal cord. 2. Small retention cyst, left ventricle.  OPERATION/PROCEDURE: Microdirect laryngoscopy and right vocal cord stripping and also excision small mucous retention cyst, left ventricle.  ANESTHESIA: General.  SURGEON: Windell Moment, M.D.  DESCRIPTION OF PROCEDURE:  With the patient under general oral tracheal anesthesia, complete laryngoscopy was normal. Both valleculae were clear. Epiglottis normal mucosa, lingual and glottic surfaces. Both piriform sinuses were completely clear of mucosa. Interior of the larynx was exposed, demonstrating prominent leukoplakial changes of the superior and free margin of the right anterior true vocal cord. The laryngoscope was suspended and under visualization with the operating microscope, preoperative photo was obtained. The right vocal cord lesion was stripped away with cup forceps. The mucosa stripped away readily and smoothly. Also noted was the dome of a small retention cyst presenting in the anterior left ventricle. This was uncapped and stripped away and sent as a separate specimen. Postoperative photo obtained. The patient tolerated the procedure well. Taken to the recovery room in stable condition. DD:  09/15/99 TD:  09/15/99 Job: [id number]

## 2010-07-23 NOTE — Op Note (Signed)
. [facility]  Patient:    [name], [name] Visit Number: [id number] MRN: [id number]          Service Type: DSU Location: [facility] Attending Physician:  Barbaraann Cao Dictated by:   Josephine Cables., M.D. Proc. Date: 05/09/01 Admit Date:  05/09/2001                             Operative Report  PREOPERATIVE DIAGNOSIS: 1. Impingement. 2. Acromioclavicular joint arthritis. 3. Degenerative tearing anterior superior labrum.  POSTOPERATIVE DIAGNOSIS: 1. Impingement. 2. Acromioclavicular joint arthritis. 3. Degenerative tearing anterior superior labrum.  OPERATION PERFORMED: 1. Arthroscopic acromioplasty. 2. Debridement of torn labrum (arthroscopic). 3. Arthroscopic excision of distal clavicle.  SURGEON:  Josephine Cables., M.D.  ANESTHESIA:  General, supraclavicular block.  INDICATIONS FOR PROCEDURE:  70 year old MRI proven impingement, AC arthritis found to be amenable to outpatient arthroscopy.  DESCRIPTION OF PROCEDURE:  ____________ posterolateral anterior portal. Systematic inspection of the shoulder showed a partial rotator cuff tear on the articular side that was debrided.  It was in about the midportion of the cuff.  There was a degenerative tear of the anterior superior labrum that was debrided.  There was no glenohumeral arthritis present. Debridement of the joint was carried out separately from the acromioplasty.  The acromioplasty was accomplished with resection of about 6 to 7 mm of bone at the anterior leading edge of the acromion.  The impingement from the distal clavicle and arthritic change in the acromioclavicular joint was probably the worst pathology encountered.  About 1 cm to 1.5 cm of the distal clavicle was excised and resection line checked from the anterolateral portals.  Bursectomy was carried out with resection of excess acromion and bursa from the posterior portal.  Viewing the operation from the  lateral again the superior surface of the cuff was checked to make sure there was nothing approaching a full thickness rotator cuff tear and there was not.  After complete ____________ bursectomy, ____________ placed in the distal clavicle.  Shoulder was drained free of fluid.  Portals were closed with nylon.  Lightly compressive sterile dressing and sling applied. Dictated by:   Josephine Cables., M.D. Attending Physician:  Barbaraann Cao DD:  05/09/01 TD:  05/09/01 Job: [id number]

## 2010-07-23 NOTE — Op Note (Signed)
[name], [name]                           ACCOUNT NO.:  [id number]   MEDICAL RECORD NO.:  [id number]                   PATIENT TYPE:  OBV   LOCATION:  [id number]                                 FACILITY:  [facility]   PHYSICIAN:  Bernestine Amass, M.D.               DATE OF BIRTH:  31-Oct-1940   DATE OF PROCEDURE:  10/08/2001  DATE OF DISCHARGE:  10/09/2001                                 OPERATIVE REPORT   PREOPERATIVE DIAGNOSES:  1. Stress urinary incontinence.  2. Cystocele.   POSTOPERATIVE DIAGNOSES:  1. Stress urinary incontinence.  2. Cystocele   OPERATION PERFORMED:  1. Cystoscopy.  2. Anterior colporrhaphy.  3. SPARC urethral sling.   SURGEON:  Bernestine Amass, M.D.   ASSISTANT:  Dutch Gray, MD   ANESTHESIA:  General.   ESTIMATED BLOOD LOSS:  25 cc.   COMPLICATIONS:  None.   INDICATIONS FOR PROCEDURE:  The patient is a 70 year old white female who  presented to the urology clinic with complaints of mixed urinary  incontinence with the stress component worse than urge.  After discussing  options for treatment for stress incontinence, the patient elected to  proceed with a urethral sling.  On physical exam, the patient was also noted  to have a grade 2 to 3 cystocele.  It was discussed that repair of her  cystocele would accompany her urethral sling.  Potential risks and benefits  of this procedure were explained to the patient and she consented.   DESCRIPTION OF PROCEDURE:  The patient was taken to the operating room and a  general anesthetic was administered.  The patient was administered  preoperative antibiotics, placed in the dorsal lithotomy position and  prepped and draped in the usual sterile fashion.  Next, an 31 French Foley  catheter was inserted into the bladder and the bladder was drained.  An  Allis clamp was placed on the vaginal mucosa overlying the proximal urethra  and the mucosa was infiltrated with 0.25% lidocaine.  A #15 blade was then  used to  make a vertical midline incision in the anterior vaginal wall  measuring approximately 4 to 5 cm.  The vaginal mucosa was then dissected  free from the underlying tissue lateral to the cystocele as well as the more  proximal up to the apex of the vagina.  Once adequate mobility was obtained  and the pubic bone could be palpated on each side, attention was turned to  the cystocele repair.  2-0 Vicryl sutures were placed into the pubovesical  and pubocervical fascia in an interrupted fashion to perform the anterior  repair.  Once this was complete, attention was then turned to the urethral  sling procedure.  The [facility] trocar was passed through a stab wound in the  lower anterior abdominal wall at a point just off midline approximately 2 cm  above the pubic symphysis.  This was placed through  the rectus fascia and up  against the pubic bone.  The end of the trocar was then palpated through the  vaginal incision on the right side of the urethra and the trocar was brought  down through the vaginal incision under digital guidance.  Similar procedure  was then performed on the contralateral side.  The polypropylene urethral  sling was then brought into the field and fastened to each trocar after it  was properly aligned.  The trocars were then advanced back up toward the  abdominal wall and the ends of the sling were brought out through the stab  incisions in the abdomen.  The sling was brought up on each side.  A Kelly  clamp was placed between the urethra and the sling to ensure that the sling  was not too tight.  The plastic sheathing was removed from the sling and  once properly positioned, the sling was cut at the level of the abdominal  skin.  Of note, cystoscopy was performed after the trocars were placed and  there appeared to be no injury to the bladder.  After the sling was then  brought up through the abdominal incisions, cystoscopy was again repeated  and revealed no evidence of any  bladder injury.  Indigo carmine was  administered and seemed to be effluxing from bilateral orifices as well.  The Foley catheter was then replaced in the bladder and the vaginal incision  was examined.  Hemostasis was achieved with electrocautery.  There was a  small amount of venous bleeding from deep in the right  portion of the  incision and a piece of Surgicel was placed in this area.  Redundant vaginal  mucosa was then excised and the vaginal mucosa was reapproximated with a  running 2-0 Vicryl stitch.  Steri-Strips and benzoin were then used to close  the abdominal stab incisions.  A vaginal packing with Estrace cream was  placed and the Foley catheter was connected to straight drainage.  There  were no complications and the patient appeared to tolerated the procedure  well.  The patient was able to be extubated and transferred to the recovery  unit in satisfactory condition.  Dr. Risa Grill was the primary surgeon and  noted to be present and participated in the entire procedure.        Dutch Gray, MD                            Bernestine Amass, M.D.    LB/MEDQ  D:  10/08/2001  T:  10/12/2001  Job:  [id number]

## 2010-07-23 NOTE — Op Note (Signed)
. [facility]  Patient:    [name], [name]                         MRN: [id number] Proc. Date: 09/06/00 Adm. Date:  [id number] Disc. Date: [id number] Attending:  Kathleen Lime                           Operative Report  PREOPERATIVE DIAGNOSIS: 1. Impingement, partial rotator cuff tear. 2. Degenerative tearing, anterior superior labrum. 3. Acromioclavicular joint arthritis.  POSTOPERATIVE DIAGNOSIS: 1. Impingement, partial rotator cuff tear. 2. Degenerative tearing, anterior superior labrum. 3. Acromioclavicular joint arthritis.  OPERATION PERFORMED: 1. Rotator cuff debridement and acromioplasty arthroscopic. 2. Arthroscopic debridement, torn labrum. 3. Arthroscopic excision, distal clavicle.  SURGEON:  Josephine Cables., M.D.  ANESTHESIA:  General.  INDICATIONS FOR PROCEDURE:  The patient is a 70 year old with partial rotator cuff tear, severe impingement AC arthritis not responding to conservative treatment.  DESCRIPTION OF PROCEDURE:  Arthroscope through posterolateral anterior portal. Systematic inspection of the shoulder showed the patient to have approaching but not quite 50% tear.  The supraspinatus was debrided.  Degenerative tearing of the anterior superior labrum which was debrided.  Biceps tendon showed moderate fraying which was debrided as well intra-articularly separately from the rotator cuff.  Moderately severe impingement was noted on the rotator cuff subacromial surface with no evidence of a full thickness tear.  The CA ligament was released.  Approximately 6 to 7 mm of bone in the anterior leading edge of the acromion resected with excision of about 1 cm of the distal clavicle which was extremely hypertrophied and thickened and moderately arthritic.  Distal clavicle excision was carried out from the lateral and anterior portals with resection ____________ from the anterior end lateral portals as well.  Bursectomy was  carried out, shoulder drained free of fluid. Portals closed with nylon.  Bolus of Marcaine infiltrated in the joint. Addition of a Marcaine pump to the subacromial space.  Lightly compressive sterile dressing and sling applied.  Taken to the recovery room in stable condition. DD:  09/06/00 TD:  09/06/00 Job: [id number]

## 2010-07-23 NOTE — Op Note (Signed)
[facility]  Patient:    [name], [name] Visit Number: [id number] MRN: [id number]          Service Type: SUR Location: [id number] Attending Physician:  Clearnce Sorrel Dictated by:   Earleen Newport, M.D. Proc. Date: 02/06/01 Admit Date:  02/06/2001                             Operative Report  PREOPERATIVE DIAGNOSIS:  Cervical spondylosis at C3-4, C4-5, with right cervical radiculopathy.  POSTOPERATIVE DIAGNOSIS:  Cervical spondylosis at C3-4, C4-5, with right cervical radiculopathy.  PROCEDURE:  Anterior diskectomy and arthrodesis, with structural allograft at C3-4 and C4-5, with Synthes plate fixation, and removal of Synthes hardware at C5 to C7.  SURGEON:  Earleen Newport, M.D.  ASSISTANT:  Hosie Spangle, M.D.  ANESTHESIA:  General endotracheal.  INDICATIONS:  The patient is a 70 year old individual who about three years ago underwent an anterior diskectomy and arthrodesis from C5 to C7.  The patient had done fairly well; however, over the past six months period, she developed increasing radicular symptoms in the neck, shoulder, and right arm. This had become considerably worse over the past three to four weeks time. This has failed to relieve itself, despite conservative efforts.  The patient has significant spondylitic disease at C3-4 and C4-5, with foraminal stenosis that is quite narrowed at both these levels.  After careful consideration of her options, I discussed surgical intervention.  DESCRIPTION OF PROCEDURE:  The patient was brought to the operating room supine on the stretcher.  After a smooth induction of general endotracheal anesthesia, she was placed in 5 pounds of Holter traction.  The neck was shaved and prepped with DuraPrep and draped in a sterile fashion.  An incision was made in the transverse plane on the left side of the neck, and carried down through the platysma.  The plane between the  sternocleidomastoid and the strap muscles was dissected bluntly until the prevertebral space was reached. The superior aspect of the old hardware was identified.  Then with dissection of the old hardware to expose the screw holes, the locking screws were released; however, it was noted that the two superior-most screws were fractured, along with the right lower inferior-most screw in the body of C7. This made increased difficulty in removing the screws.  The rest of the screws were removed, and the plate was able to be mobilized.  On the right side superiorly the locking screw had come out totally; however, the crown of the screw itself had fractured itself away.  A helical screw removal device was able to be used to remove this screw.  On the right side the area around the screw had to be drilled away, so that the screw head could be grabbed with a straight instrument, and then removed.  At C6-7 drilling away of the ventral aspect of the vertebral body allowed to see the screw head, and then it could be removed with a straight hemostat.  Once this was achieved, attention was then turned to C4-5, where a diskectomy was performed.  A large posterior osteophyte from the inferior marginal body of C4, out to the uncinate process was encountered.  This was removed, particularly on the right side.  This allowed for good decompression of the right C5 nerve root.  At C3-4 a similar procedure was carried out.  Once these areas were decompressed, a 7.0 mm  tricortical bone graft was placed with the cortical surface facing dorsally. The graft was first placed at C4-5, and then was placed at C3-4.  Traction was removed.  The neck was then irrigated copiously with antibiotic irrigating solution, and then a 37.0 mm standard Synthes plate was placed over the ventral aspect of the vertebral bodies.  The plate was affixed with four locking 4.0 mm x 14.0 mm screws at C3 and C4 and C5.  Four 0.35 mm x 14.0  mm screws were placed.  The system was checked on a radiograph.  The area was copiously irrigated with antibiotic irrigating solution.  Hemostasis in the soft tissues was doubly checked, and the platysma was closed with #3-0 Vicryl in an interrupted fashion, and #3-0 Vicryl was used in the subcuticular tissues.  The patient tolerated the procedure well and was returned to the recovery room in stable condition. Dictated by:   Earleen Newport, M.D. Attending Physician:  Clearnce Sorrel DD:  02/06/01 TD:  02/06/01 Job: [id number]

## 2010-10-05 ENCOUNTER — Other Ambulatory Visit (HOSPITAL_COMMUNITY): Payer: Self-pay | Admitting: Neurological Surgery

## 2010-10-05 DIAGNOSIS — M48061 Spinal stenosis, lumbar region without neurogenic claudication: Secondary | ICD-10-CM

## 2010-10-14 ENCOUNTER — Ambulatory Visit (HOSPITAL_COMMUNITY)
Admission: RE | Admit: 2010-10-14 | Discharge: 2010-10-14 | Disposition: A | Discharge status: Home / Self Care | Payer: Medicare FFS | Source: Ambulatory Visit | Attending: Neurological Surgery | Admitting: Neurological Surgery

## 2010-10-14 DIAGNOSIS — M5137 Other intervertebral disc degeneration, lumbosacral region: Secondary | ICD-10-CM | POA: Insufficient documentation

## 2010-10-14 DIAGNOSIS — M48061 Spinal stenosis, lumbar region without neurogenic claudication: Secondary | ICD-10-CM

## 2010-10-14 DIAGNOSIS — M549 Dorsalgia, unspecified: Secondary | ICD-10-CM | POA: Insufficient documentation

## 2010-10-14 DIAGNOSIS — M51379 Other intervertebral disc degeneration, lumbosacral region without mention of lumbar back pain or lower extremity pain: Secondary | ICD-10-CM | POA: Insufficient documentation

## 2010-10-14 LAB — GLUCOSE, CAPILLARY
Glucose-Capillary: 126 mg/dL — ABNORMAL HIGH (ref 70–99)
Glucose-Capillary: 115 mg/dL — ABNORMAL HIGH (ref 70–99)

## 2010-10-14 MED ORDER — IOHEXOL 180 MG/ML  SOLN
16.0000 mL | Freq: Once | INTRAMUSCULAR | Status: AC | PRN
  Administered 2010-10-14: 16 mL via INTRATHECAL

## 2010-11-24 ENCOUNTER — Encounter (HOSPITAL_COMMUNITY)
Admission: RE | Admit: 2010-11-24 | Discharge: 2010-11-24 | Disposition: A | Discharge status: Home / Self Care | Payer: Medicare FFS | Source: Ambulatory Visit | Attending: Neurological Surgery | Admitting: Neurological Surgery

## 2010-11-24 LAB — TYPE AND SCREEN
ABO/RH(D): A POS
Antibody Screen: NEGATIVE

## 2010-11-24 LAB — CBC
HCT: 42.3 % (ref 36.0–46.0)
Hemoglobin: 14.6 g/dL (ref 12.0–15.0)
MCH: 32.6 pg (ref 26.0–34.0)
MCHC: 34.5 g/dL (ref 30.0–36.0)
MCV: 94.4 fL (ref 78.0–100.0)
Platelets: 224 10*3/uL (ref 150–400)
RBC: 4.48 MIL/uL (ref 3.87–5.11)
RDW: 13.2 % (ref 11.5–15.5)
WBC: 8.8 10*3/uL (ref 4.0–10.5)

## 2010-11-24 LAB — BASIC METABOLIC PANEL
BUN: 11 mg/dL (ref 6–23)
CO2: 29 mEq/L (ref 19–32)
Calcium: 9.9 mg/dL (ref 8.4–10.5)
Chloride: 104 mEq/L (ref 96–112)
Creatinine, Ser: 1.09 mg/dL (ref 0.50–1.10)
GFR calc Af Amer: >60 mL/min (ref >60)
GFR calc non Af Amer: 50 mL/min — ABNORMAL LOW (ref >60)
Glucose, Bld: 133 mg/dL — ABNORMAL HIGH (ref 70–99)
Potassium: 3.8 mEq/L (ref 3.5–5.1)
Sodium: 143 mEq/L (ref 135–145)

## 2010-11-24 LAB — SURGICAL PCR SCREEN
MRSA, PCR: NEGATIVE
Staphylococcus aureus: NEGATIVE

## 2010-11-24 LAB — ABO/RH: ABO/RH(D): A POS

## 2010-12-02 ENCOUNTER — Inpatient Hospital Stay (HOSPITAL_COMMUNITY)
Admission: RE | Admit: 2010-12-02 | Discharge: 2010-12-06 | DRG: 460 | Disposition: A | Discharge status: Home / Self Care | Payer: Medicare FFS | Source: Ambulatory Visit | Attending: Neurological Surgery | Admitting: Neurological Surgery

## 2010-12-02 ENCOUNTER — Inpatient Hospital Stay (HOSPITAL_COMMUNITY): Payer: Medicare FFS

## 2010-12-02 DIAGNOSIS — I2510 Atherosclerotic heart disease of native coronary artery without angina pectoris: Secondary | ICD-10-CM | POA: Diagnosis present

## 2010-12-02 DIAGNOSIS — G4733 Obstructive sleep apnea (adult) (pediatric): Secondary | ICD-10-CM | POA: Diagnosis present

## 2010-12-02 DIAGNOSIS — Z888 Allergy status to other drugs, medicaments and biological substances status: Secondary | ICD-10-CM

## 2010-12-02 DIAGNOSIS — M47817 Spondylosis without myelopathy or radiculopathy, lumbosacral region: Secondary | ICD-10-CM | POA: Diagnosis present

## 2010-12-02 DIAGNOSIS — Z9981 Dependence on supplemental oxygen: Secondary | ICD-10-CM

## 2010-12-02 DIAGNOSIS — M4310 Spondylolisthesis, site unspecified: Principal | ICD-10-CM | POA: Diagnosis present

## 2010-12-02 DIAGNOSIS — Z7982 Long term (current) use of aspirin: Secondary | ICD-10-CM

## 2010-12-02 DIAGNOSIS — Z7902 Long term (current) use of antithrombotics/antiplatelets: Secondary | ICD-10-CM

## 2010-12-02 DIAGNOSIS — E119 Type 2 diabetes mellitus without complications: Secondary | ICD-10-CM | POA: Diagnosis present

## 2010-12-02 DIAGNOSIS — K219 Gastro-esophageal reflux disease without esophagitis: Secondary | ICD-10-CM | POA: Diagnosis present

## 2010-12-02 DIAGNOSIS — Z9861 Coronary angioplasty status: Secondary | ICD-10-CM

## 2010-12-02 DIAGNOSIS — IMO0002 Reserved for concepts with insufficient information to code with codable children: Secondary | ICD-10-CM | POA: Diagnosis present

## 2010-12-02 DIAGNOSIS — I10 Essential (primary) hypertension: Secondary | ICD-10-CM | POA: Diagnosis present

## 2010-12-02 DIAGNOSIS — M48062 Spinal stenosis, lumbar region with neurogenic claudication: Secondary | ICD-10-CM | POA: Diagnosis present

## 2010-12-02 LAB — GLUCOSE, CAPILLARY
Glucose-Capillary: 138 mg/dL — ABNORMAL HIGH (ref 70–99)
Glucose-Capillary: 135 mg/dL — ABNORMAL HIGH (ref 70–99)

## 2010-12-03 LAB — GLUCOSE, CAPILLARY
Glucose-Capillary: 154 mg/dL — ABNORMAL HIGH (ref 70–99)
Glucose-Capillary: 143 mg/dL — ABNORMAL HIGH (ref 70–99)
Glucose-Capillary: 120 mg/dL — ABNORMAL HIGH (ref 70–99)
Glucose-Capillary: 121 mg/dL — ABNORMAL HIGH (ref 70–99)

## 2010-12-03 LAB — CBC
HCT: 34.2 % — ABNORMAL LOW (ref 36.0–46.0)
Hemoglobin: 11.4 g/dL — ABNORMAL LOW (ref 12.0–15.0)
MCH: 32.3 pg (ref 26.0–34.0)
MCHC: 33.3 g/dL (ref 30.0–36.0)
MCV: 96.9 fL (ref 78.0–100.0)
Platelets: 159 10*3/uL (ref 150–400)
RBC: 3.53 MIL/uL — ABNORMAL LOW (ref 3.87–5.11)
RDW: 13.5 % (ref 11.5–15.5)
WBC: 9.2 10*3/uL (ref 4.0–10.5)

## 2010-12-04 LAB — GLUCOSE, CAPILLARY
Glucose-Capillary: 129 mg/dL — ABNORMAL HIGH (ref 70–99)
Glucose-Capillary: 135 mg/dL — ABNORMAL HIGH (ref 70–99)
Glucose-Capillary: 127 mg/dL — ABNORMAL HIGH (ref 70–99)
Glucose-Capillary: 112 mg/dL — ABNORMAL HIGH (ref 70–99)

## 2010-12-05 LAB — GLUCOSE, CAPILLARY
Glucose-Capillary: 115 mg/dL — ABNORMAL HIGH (ref 70–99)
Glucose-Capillary: 123 mg/dL — ABNORMAL HIGH (ref 70–99)
Glucose-Capillary: 96 mg/dL (ref 70–99)

## 2010-12-06 DIAGNOSIS — Q762 Congenital spondylolisthesis: Secondary | ICD-10-CM

## 2010-12-06 DIAGNOSIS — IMO0002 Reserved for concepts with insufficient information to code with codable children: Secondary | ICD-10-CM

## 2010-12-06 LAB — GLUCOSE, CAPILLARY
Glucose-Capillary: 114 mg/dL — ABNORMAL HIGH (ref 70–99)
Glucose-Capillary: 110 mg/dL — ABNORMAL HIGH (ref 70–99)

## 2010-12-07 LAB — GLUCOSE, CAPILLARY
Glucose-Capillary: 130 mg/dL — ABNORMAL HIGH (ref 70–99)
Glucose-Capillary: 98 mg/dL (ref 70–99)

## 2010-12-07 LAB — COMPREHENSIVE METABOLIC PANEL
ALT: 17 U/L (ref 0–35)
AST: 18 U/L (ref 0–37)
Albumin: 4 g/dL (ref 3.5–5.2)
Alkaline Phosphatase: 84 U/L (ref 39–117)
BUN: 10 mg/dL (ref 6–23)
CO2: 30 mEq/L (ref 19–32)
Calcium: 10 mg/dL (ref 8.4–10.5)
Chloride: 99 mEq/L (ref 96–112)
Creatinine, Ser: 1.18 mg/dL (ref 0.4–1.2)
GFR calc Af Amer: 55 mL/min — ABNORMAL LOW (ref >60)
GFR calc non Af Amer: 46 mL/min — ABNORMAL LOW (ref >60)
Glucose, Bld: 125 mg/dL — ABNORMAL HIGH (ref 70–99)
Potassium: 3.1 mEq/L — ABNORMAL LOW (ref 3.5–5.1)
Sodium: 139 mEq/L (ref 135–145)
Total Bilirubin: 0.6 mg/dL (ref 0.3–1.2)
Total Protein: 7.1 g/dL (ref 6.0–8.3)

## 2010-12-07 LAB — CBC
HCT: 41.5 % (ref 36.0–46.0)
Hemoglobin: 13.9 g/dL (ref 12.0–15.0)
MCHC: 33.5 g/dL (ref 30.0–36.0)
MCV: 92.8 fL (ref 78.0–100.0)
Platelets: 227 10*3/uL (ref 150–400)
RBC: 4.47 MIL/uL (ref 3.87–5.11)
RDW: 14 % (ref 11.5–15.5)
WBC: 7.3 10*3/uL (ref 4.0–10.5)

## 2010-12-07 LAB — TSH: TSH: 1.818 u[IU]/mL (ref 0.350–4.500)

## 2010-12-10 LAB — GLUCOSE, CAPILLARY
Glucose-Capillary: 101 mg/dL — ABNORMAL HIGH (ref 70–99)
Glucose-Capillary: 121 mg/dL — ABNORMAL HIGH (ref 70–99)
Glucose-Capillary: 124 mg/dL — ABNORMAL HIGH (ref 70–99)
Glucose-Capillary: 138 mg/dL — ABNORMAL HIGH (ref 70–99)
Glucose-Capillary: 93 mg/dL (ref 70–99)

## 2010-12-16 LAB — CBC
HCT: 41.4
HCT: 43.9
Hemoglobin: 14
Hemoglobin: 14.8
MCHC: 33.8
MCHC: 33.8
MCV: 88.9
MCV: 89.4
Platelets: 175
Platelets: 76 — ABNORMAL LOW
RBC: 4.66
RBC: 4.91
RDW: 14.5 — ABNORMAL HIGH
RDW: 15.1 — ABNORMAL HIGH
WBC: 6
WBC: 8.6

## 2010-12-16 LAB — BASIC METABOLIC PANEL
BUN: 12
BUN: 9
CO2: 28
CO2: 32
Calcium: 8.8
Calcium: 9.3
Chloride: 100
Chloride: 97
Creatinine, Ser: 0.91
Creatinine, Ser: 0.98
GFR calc Af Amer: >60
GFR calc Af Amer: >60
GFR calc non Af Amer: 57 — ABNORMAL LOW
GFR calc non Af Amer: >60
Glucose, Bld: 124 — ABNORMAL HIGH
Glucose, Bld: 134 — ABNORMAL HIGH
Potassium: 4.2
Potassium: 4.3
Sodium: 137
Sodium: 139

## 2010-12-16 LAB — CK TOTAL AND CKMB (NOT AT ARMC)
CK, MB: 1.6
Relative Index: INVALID
Total CK: 59

## 2010-12-16 LAB — CARDIAC PANEL(CRET KIN+CKTOT+MB+TROPI)
CK, MB: 2.1
CK, MB: 2.4
Relative Index: 1.8
Relative Index: INVALID
Total CK: 131
Total CK: 92
Troponin I: 0.05
Troponin I: 0.06

## 2010-12-16 LAB — TROPONIN I: Troponin I: 0.04

## 2010-12-16 LAB — PLATELET COUNT
Platelets: 77 — ABNORMAL LOW
Platelets: 83 — ABNORMAL LOW

## 2011-01-05 NOTE — Op Note (Signed)
[name], Bradley NO.:  [id number]  MEDICAL RECORD NO.:  [id number]  LOCATION:  3101                         FACILITY:  [place]  PHYSICIAN:  Earleen Newport, M.D.  DATE OF BIRTH:  27-Apr-1940  DATE OF PROCEDURE:  12/02/2010 DATE OF DISCHARGE:                              OPERATIVE REPORT   PREOPERATIVE DIAGNOSIS:  Lumbar spondylosis and stenosis L3-L4 with radiculopathy and neurogenic claudication, status post laminotomy L3-L4 right in May 2012.  POSTOPERATIVE DIAGNOSIS:  Lumbar spondylosis and stenosis L3-L4 with radiculopathy and neurogenic claudication, status post laminotomy L3-L4 right in May 2012.  PROCEDURE:  Bilateral laminotomies, decompression of L3-L4 with extensive decompression more than required for simple posterior lumbar interbody arthrodesis, posterior lumbar interbody arthrodesis using peak spacers local autograft and allograft, posterolateral arthrodesis with local autograft and allograft and pedicle screw fixation L3-L4.  SURGEON:  Earleen Newport, MD  FIRST ASSISTANT:  Hosie Spangle, MD  ANESTHESIA:  General endotracheal.  INDICATIONS:  [name] is a 70 year old individual had significant spondylitic disease in the midportion lumbar spine.  She had a right lumbar radiculopathy which was secondary to a synovial cyst at L3-L4 on the right side.  She had a laminotomy and foraminotomy, initially had good relief of her pain, but then rather abruptly in July she developed worsening pain in the back and the low right lower extremity was identified, then on plain radiographs that she developed a spondylolisthesis and followup myelogram demonstrated the patient had advanced stenosis secondary to spondylolisthesis.  She had a substantial rotatory component there.  Because of the progression of this process, it was felt that she needs stabilization condition of the decompression of both the L3-L4 nerve roots for relief of the  stenosis.  DESCRIPTION OF PROCEDURE:  The patient was brought to the operating room supine on the stretcher.  After smooth induction of general endotracheal anesthesia, she was turned prone.  The back was prepped with alcohol and DuraPrep and draped in sterile fashion.  The previously made vertical incision was enlarged and carried down to lumbodorsal fascia which was opened on either side of midline, interlaminar space at L3-L4 was identified positively and then previously made laminotomy was cleared, dissection was carried out laterally to expose the common dural tube and takeoff the L3 nerve root superiorly, L4 nerve root was then gradually skeletonized and mobilized inferiorly.  The common dural tube could be mobilized off the disk space.  The disk space itself was noted be very soft, very fluctuant was incised with 15 blade in this area, significant amount of severely degenerating desiccated disk material was encountered, this was removed in a piecemeal fashion using combination of curettes, rongeurs and osteotomes to mobilize anomaly the diskmaterial, but also the endplate material.  This was first done on the right side and then the left side was similarly decompressed and the L3- L4 nerve roots were mobilized fully and decompressed out to the foramen. The diskectomy was then ensued and this was completed and also removing the endplate material.  Interbody spacers were then placed and ti was felt that a 13-mm spacer would fit best to allow for good decompression which by  distraction caused some reduction of the rotatory component. These were filled with a combination of allograft which was demineralized bone matrix in the form of Vitoss that was mixed with Purigen.  This was then placed into the interspace and countersunk with additional Vitoss being packed along side the peak spacers that were placed.  Then, posterolateral gutters were decorticated and these were also packed with  Purigen and Vitoss.  In addition to the autograft from the laminotomies that had been obtained, pedicle entry sites were then chosen under fluoroscopic guidance with pedicle probes individually being placed, then 6.5 mm tap was used to tap the holes, cutout was checked by palpation and was verified that was within the bone 6.5 x 45 mm pedicle screws were placed into each of L3-L4, two short 35 mm preoperative-contoured rods were then used to connect the construct together.  The rotatory component was further reduced by tightening the screws in appropriate position and final radiographs identified good alignment in the coronal and sagittal planes.  With this being accomplished, the lateral packing of bone graft was checked, the patency of the foramen for the L3-L4 nerve roots was again checked and hemostasis in the soft tissues was verified when this was all intact, then retractors were removed.  Wound was carefully checked for hemostasis and then the lumbodorsal fascia was closed #1 Vicryl interrupted fashion, 2-0 Vicryl using subcutaneous tissues, 3-0 Vicryl subcuticularly, blood loss for entire procedure was estimated little over 200 mL.  The patient tolerated procedure well and was returned to recovery room in stable condition.     Earleen Newport, M.D.     Drucilla Schmidt  D:  12/02/2010  T:  12/03/2010  Job:  [id number]  Electronically Signed by Kristeen Miss M.D. on 01/05/2011 07:01:29 AM

## 2011-01-05 NOTE — H&P (Signed)
[name], [name] NO.:  [id number]  MEDICAL RECORD NO.:  [id number]  LOCATION:  3101                         FACILITY:  [place]  PHYSICIAN:  Earleen Newport, M.D.  DATE OF BIRTH:  April 01, 1940  DATE OF ADMISSION:  12/02/2010 DATE OF DISCHARGE:                             HISTORY & PHYSICAL   ADMITTING DIAGNOSES: 1. Spondylolisthesis L3-L4 with bilateral lumbar radiculopathy,     neurogenic claudication. 2. Diabetes mellitus. 3. Status post laminotomy and foraminotomy L3-4 right for synovial     cyst in May 2012.  HISTORY OF PRESENT ILLNESS:  [name] is a 70 year old individual who has had significant back and right lower extremity pain initially. Earlier this year, she had a synovial cyst and spondylosis at L3-L4 on the right side.  She underwent microsurgical decompression, and initially had good relief, but rather abruptly in mid July and August, she developed further pain radiating down the right lower extremity. Radiographs at that time and a myelogram demonstrated that she developed a spondylolisthesis at the L3-L4 level with a rotatory component and there was evidence of significant central canal stenosis at this time. Because of the nature of the spondylolisthesis and rapid progression, I advised that she would require surgical decompression and stabilization using posterior interbody technique.  She is now admitted for this procedure.  PAST MEDICAL HISTORY:  Significant for spondylosis and significant for diabetes mellitus and coronary artery disease.  She has had a couple of stenting procedures in the past and most recently had an angiogram to confirm that she has some mild-to-moderate disease that is nonocclusive and her stents are in good position.  She remains on Plavix.  She also has hypertension.  Her allergies are notable to VALIUM.  CURRENT MEDICATIONS: 1. Methocarbamol for muscle spasm. 2. Hydrocodone for pain. 3. Vilazodone 40 mg  daily. 4. Aspirin 81 mg a day. 5. Carvedilol 6.25 mg daily. 6. Furosemide 40 mg a day. 7. Metformin 500 mg twice daily. 8. Iron supplementation. 9. Plavix 75 mg a day. 10.Simvastatin 20 mg a day. 11.Synthroid 100 mcg per day. 12.Xanax for p.r.n. usage.  FAMILY HISTORY:  Reveals both her parents are deceased.  Family has a history of high blood pressure and diabetes.  SOCIAL HISTORY:  Reveals the patient is married and she has four grown children.  PHYSICAL EXAMINATION:  GENERAL:  Reveals that she is alert and oriented and cooperative individual and complaining of pain in both lower extremities.  She sits comfortably, she stands rather slowly and prefers 10 degrees forward stoop. MUSCULOSKELETAL:  Her motor function reveals good strength in iliopsoas, quad, tibialis, anterior, and gastrocs.  However her tolerance to walking more than 25 feet is limited and she feels the need to urgently sit down, sitting does give her good transient relief.  Her sensory examination is intact in lower extremities, intact in the upper extremities. NEUROLOGIC:  Cranial nerve examination reveals pupils are 4 mm briskly reactive to light and accommodation.  Extraocular movements are full. Her face is symmetric to grimace.  The tongue and uvula in the midline scar and conjunctivae are clear.  Sensation is intact and equal in both sides of  the body and the face. HEENT:  Normal. LUNGS:  Clear to auscultation. HEART:  Regular rate and rhythm.  No murmurs are heard. ABDOMEN:  Soft, protuberant.  Bowel sounds are positive.  No masses are noted. EXTREMITIES:  Reveal no cyanosis, clubbing, or edema.  She has well- healed midline scar in the midportion lumbar spine.  IMPRESSION:  The patient has evidence of significant spondylolisthesis at L3-L4 with severe stenosis and she now has symptoms of both radiculopathy and neurogenic claudication.  It has been advised regarding surgical decompression and  stabilization of the level of L3- L4.     Earleen Newport, M.D.     Drucilla Schmidt  D:  12/02/2010  T:  12/03/2010  Job:  [id number]  Electronically Signed by Kristeen Miss M.D. on 01/05/2011 07:01:25 AM

## 2011-01-05 NOTE — Discharge Summary (Signed)
  [name], [name] NO.:  [id number]  MEDICAL RECORD NO.:  [id number]  LOCATION:  3015                         FACILITY:  [facility]  PHYSICIAN:  Earleen Newport, M.D.  DATE OF BIRTH:  [birth year]  DATE OF ADMISSION:  12/02/2010 DATE OF DISCHARGE:  12/06/2010                              DISCHARGE SUMMARY   ADMITTING DIAGNOSIS:  Lumbar spondylosis and stenosis L3-4 with radiculopathy and neurogenic claudication status post laminotomy L3-4 in May 2012.  DISCHARGE DIAGNOSIS:  Lumbar spondylosis and stenosis L3-4 with radiculopathy and neurogenic claudication status post laminotomy L3-4 in May 2012.  OPERATIONS AND PROCEDURES:  Bilateral laminotomies decompression L3-4 with posterior spinal fusion and decompression, posterolateral arthrodesis and interbody fusion spacers.  BRIEF HISTORY AND HOSPITAL COURSE:  The patient is a 70 year old female who had significant lumbar spondylitic disease in the midportion of the lumbar spine.  She has synovial cyst at L3-4 and underwent laminotomy and foraminotomy, initially had good pain relief but abruptly developed worsening pain and discomfort and failed to respond to conservative care.  Followup myelogram revealed degenerative spondylolisthesis with advanced stenosis secondary to this and because she failed conservative care she was advised on decompression and fusion of the L3-4 level.  She elected to proceed.  She tolerated this procedure well on December 02, 2010, stable brought to recovery room, placed in neurosurgical ICU. First day postoperatively, the patient was doing well, weaned off the PCA pump to p.o. pain medicines.  Foley catheter was discontinued and started physical therapy and occupational therapy.  Second day postoperatively, the patient was making good progress.  She was advancing with her mobility.  She was eating well, voiding well and remained afebrile.  Wound benign, neurovascularly intact bilateral  lower extremities.  Ready for discharge home on December 06, 2010.  DISCHARGE CONDITION:  Stable and improved.  DISCHARGE INSTRUCTIONS:  Discharged home.  Given prescription for Percocet 5/325 one-two p.o. q.4 h. p.r.n. pain, Robaxin 500 mg 1 p.o. q.6 h. p.r.n. pain.  Follow up with Dr. Ellene Route in 3 weeks and home health physical therapy.  Contact our office prior to follow up if there are any questions or concerns.  Back precautions lumbar corset when she is up and ambulating.  All questions were encouraged, answered and addressed.     Merlene Pulling Toney Sang.   ______________________________ Earleen Newport, M.D.    SCI/MEDQ  D:  12/14/2010  T:  12/15/2010  Job:  [id number]  Electronically Signed by Glendell Docker. on 12/15/2010 03:57:32 PM Electronically Signed by Kristeen Miss M.D. on 01/05/2011 07:01:39 AM

## 2011-03-08 HISTORY — PX: BACK SURGERY: SHX140

## 2011-06-10 ENCOUNTER — Encounter (INDEPENDENT_AMBULATORY_CARE_PROVIDER_SITE_OTHER): Payer: Medicare FFS | Admitting: Obstetrics and Gynecology

## 2011-06-10 DIAGNOSIS — Z01419 Encounter for gynecological examination (general) (routine) without abnormal findings: Secondary | ICD-10-CM

## 2011-06-22 ENCOUNTER — Telehealth: Payer: Self-pay

## 2011-06-22 NOTE — Telephone Encounter (Signed)
Per AR, I spoke to pt to let her know that she got BH's message about pt cancelling her surgery. Dr. Mancel Bale just wanted to know if she had any ?'s about the surgery. Pt said "No, not really, I just am really not up for any more surgery right now". She is scared to death of being put to sleep, and for the time being, she just wants to deal with her sx's as long as she possibly can. Eventually, she knows she will have to have the surgery, but now is just not the right time. I told her I'd relay that info to Dr. Mancel Bale. JO, CMA

## 2011-09-16 ENCOUNTER — Other Ambulatory Visit: Payer: Self-pay | Admitting: Neurological Surgery

## 2011-09-16 DIAGNOSIS — M47816 Spondylosis without myelopathy or radiculopathy, lumbar region: Secondary | ICD-10-CM

## 2011-09-26 ENCOUNTER — Other Ambulatory Visit: Payer: Medicare FFS

## 2011-09-30 ENCOUNTER — Ambulatory Visit
Admission: RE | Admit: 2011-09-30 | Discharge: 2011-09-30 | Disposition: A | Discharge status: Home / Self Care | Payer: Medicare FFS | Source: Ambulatory Visit | Attending: Neurological Surgery | Admitting: Neurological Surgery

## 2011-09-30 DIAGNOSIS — M47816 Spondylosis without myelopathy or radiculopathy, lumbar region: Secondary | ICD-10-CM

## 2011-09-30 MED ORDER — GADOBENATE DIMEGLUMINE 529 MG/ML IV SOLN
15.0000 mL | Freq: Once | INTRAVENOUS | Status: AC | PRN
Start: 2011-09-30 — End: 2011-09-30
  Administered 2011-09-30: 15 mL via INTRAVENOUS

## 2012-03-09 ENCOUNTER — Other Ambulatory Visit: Payer: Self-pay | Admitting: Neurological Surgery

## 2012-03-31 ENCOUNTER — Other Ambulatory Visit: Payer: Self-pay | Admitting: Neurological Surgery

## 2012-04-16 ENCOUNTER — Other Ambulatory Visit (HOSPITAL_COMMUNITY): Payer: Self-pay | Admitting: Neurological Surgery

## 2012-08-22 ENCOUNTER — Encounter: Payer: Self-pay | Admitting: Cardiovascular Disease

## 2012-08-23 ENCOUNTER — Ambulatory Visit: Payer: Medicare FFS | Admitting: Cardiovascular Disease

## 2012-10-15 ENCOUNTER — Other Ambulatory Visit: Payer: Self-pay

## 2012-10-15 MED ORDER — CARVEDILOL 6.25 MG PO TABS: 6.2500 mg | ORAL_TABLET | Freq: Two times a day (BID) | ORAL | Status: DC

## 2012-10-15 NOTE — Telephone Encounter (Signed)
Rx was sent to pharmacy electronically. 

## 2012-10-18 ENCOUNTER — Other Ambulatory Visit: Payer: Self-pay | Admitting: Cardiovascular Disease

## 2012-10-18 NOTE — Telephone Encounter (Signed)
Rx was sent to pharmacy electronically. 

## 2012-11-08 ENCOUNTER — Ambulatory Visit: Payer: Medicare FFS | Admitting: Cardiovascular Disease

## 2012-12-17 ENCOUNTER — Ambulatory Visit: Payer: Medicare FFS | Admitting: Cardiovascular Disease

## 2012-12-26 ENCOUNTER — Encounter: Payer: Self-pay | Admitting: Cardiovascular Disease

## 2012-12-26 ENCOUNTER — Ambulatory Visit (INDEPENDENT_AMBULATORY_CARE_PROVIDER_SITE_OTHER): Payer: Medicare FFS | Admitting: Cardiovascular Disease

## 2012-12-26 VITALS — BP 132/76 | HR 93 | Ht 66.0 in | Wt 198.8 lb

## 2012-12-26 DIAGNOSIS — Z01818 Encounter for other preprocedural examination: Secondary | ICD-10-CM

## 2012-12-26 DIAGNOSIS — F17200 Nicotine dependence, unspecified, uncomplicated: Secondary | ICD-10-CM

## 2012-12-26 DIAGNOSIS — I10 Essential (primary) hypertension: Secondary | ICD-10-CM

## 2012-12-26 DIAGNOSIS — E119 Type 2 diabetes mellitus without complications: Secondary | ICD-10-CM

## 2012-12-26 DIAGNOSIS — I447 Left bundle-branch block, unspecified: Secondary | ICD-10-CM

## 2012-12-26 DIAGNOSIS — I2510 Atherosclerotic heart disease of native coronary artery without angina pectoris: Secondary | ICD-10-CM

## 2012-12-26 DIAGNOSIS — E1122 Type 2 diabetes mellitus with diabetic chronic kidney disease: Secondary | ICD-10-CM | POA: Insufficient documentation

## 2012-12-26 DIAGNOSIS — Z79899 Other long term (current) drug therapy: Secondary | ICD-10-CM

## 2012-12-26 DIAGNOSIS — Z794 Long term (current) use of insulin: Secondary | ICD-10-CM | POA: Insufficient documentation

## 2012-12-26 DIAGNOSIS — D689 Coagulation defect, unspecified: Secondary | ICD-10-CM

## 2012-12-26 DIAGNOSIS — Z720 Tobacco use: Secondary | ICD-10-CM | POA: Insufficient documentation

## 2012-12-26 DIAGNOSIS — E785 Hyperlipidemia, unspecified: Secondary | ICD-10-CM | POA: Insufficient documentation

## 2012-12-26 LAB — CBC
HCT: 38.5 % (ref 36.0–46.0)
Hemoglobin: 13 g/dL (ref 12.0–15.0)
MCH: 31.6 pg (ref 26.0–34.0)
MCHC: 33.8 g/dL (ref 30.0–36.0)
MCV: 93.4 fL (ref 78.0–100.0)
Platelets: 254 10*3/uL (ref 150–400)
RBC: 4.12 MIL/uL (ref 3.87–5.11)
RDW: 13.6 % (ref 11.5–15.5)
WBC: 7 10*3/uL (ref 4.0–10.5)

## 2012-12-26 LAB — BASIC METABOLIC PANEL
BUN: 15 mg/dL (ref 6–23)
CO2: 30 mEq/L (ref 19–32)
Calcium: 9.4 mg/dL (ref 8.4–10.5)
Chloride: 102 mEq/L (ref 96–112)
Creat: 1.27 mg/dL — ABNORMAL HIGH (ref 0.50–1.10)
Glucose, Bld: 193 mg/dL — ABNORMAL HIGH (ref 70–99)
Potassium: 4.1 mEq/L (ref 3.5–5.3)
Sodium: 138 mEq/L (ref 135–145)

## 2012-12-26 LAB — PROTIME-INR
INR: 0.89 (ref <1.50)
Prothrombin Time: 12.1 seconds (ref 11.6–15.2)

## 2012-12-26 LAB — APTT: aPTT: 32 seconds (ref 24–37)

## 2012-12-26 NOTE — Assessment & Plan Note (Signed)
Status post remote RCA stenting with mild to moderate distal left main disease. She has been cathed multiple times by Drs. Tami Ribas, gamble and myself most recently January 2012. She had 50% in-stent restenosis within the RCA stent and 40% distal left main. This was done after a Myoview stress test showed anteroapical scar versus breast attenuation artifact. I saw her one year ago. Her last several weeks she's developed chest pain that is different from her prior pain that is "squeezing" occurring every other day lasting up to minutes at a time. Based on the fact that her Myoview stress test is abnormal at baseline and that she had distal left main disease, I feel compelled to proceed with cardiac catheterization to define her anatomy and rule out progression. I will do this radially. We have discussed the risks and benefits.

## 2012-12-26 NOTE — Patient Instructions (Addendum)
Your physician has requested that you have a cardiac catheterization. Cardiac catheterization is used to diagnose and/or treat various heart conditions. Doctors may recommend this procedure for a number of different reasons. The most common reason is to evaluate chest pain. Chest pain can be a symptom of coronary artery disease (CAD), and cardiac catheterization can show whether plaque is narrowing or blocking your heart's arteries. This procedure is also used to evaluate the valves, as well as measure the blood flow and oxygen levels in different parts of your heart. For further information please visit [email or web address].   Following your catheterization, you will not be allowed to drive for 3 days.  No lifting, pushing, or pulling greater that 10 pounds is allowed for 1 week.  When the procedure is scheduled, you will be given a date to have bloodwork  done.     Radial cath, tomorrow

## 2012-12-26 NOTE — Assessment & Plan Note (Signed)
On statin therapy followed by her PCP 

## 2012-12-26 NOTE — Progress Notes (Signed)
12/26/2012 [name]   1940-05-18  [id number]  Primary Physician Gildardo Cranker, MD Primary Cardiologist: Lorretta Harp MD [name]   HPI:  The patient is a 72 year old mildly overweight married Caucasian female, mother of 7, grandmother of 2 grandchildren, whom I last saw in the office April 13, 2010. She has a history of CAD, status post RCA stenting in the past. She was catheterized by Dr. Janene Madeira May 04, 2007, after abnormal Myoview revealing 30% to 40% eccentric distal left main. A proximal RCA stent was patent and her EF was normal. Other problems include hypertension, hyperlipidemia, and diabetes. She has chronic left bundle branch block. She smokes 1-1/2 packs of cigarettes a day. She has had back surgery by Dr. Lissa Merlin. Myoview stress test performed March 24, 2010, revealed new anteroapical scar, and because of shortness of breath I catheterized her March 31, 2010, revealing 50% "in-stent restenosis" within the RCA stent, which was smooth, 40% distal left main with anatomy unchanged from the prior study and normal LV function. She really denies chest pain or shortness of breath. Her most recent lab work, performed by Dr. Buddy Duty in June, revealed a total cholesterol of 138, LDL of 57, HDL of 44. I saw her one year ago. Over the last several weeks she developed new onset chest pressure occurring every other day lasting for minutes at a time associated with diaphoresis. I'm concerned that she may have progression of her left main disease or and/or her RCA in-stent restenosis. Her Myoview performed prior to her last cath in January 2012 was on the telephone with anteroapical scar versus breast attenuation artifact. Based on her anatomy and her symptoms I feel compelled to proceed with an outpatient cardiac catheterization which we will perform a right radial approach.     Current Outpatient Prescriptions  Medication Sig Dispense Refill  . ALPRAZolam (XANAX)  1 MG tablet Take 1 mg by mouth 4 (four) times daily.      Marland Kitchen aspirin 81 MG tablet Take 81 mg by mouth daily.      . carvedilol (COREG) 6.25 MG tablet Take 1 tablet (6.25 mg total) by mouth 2 (two) times daily with a meal.  180 tablet  0  . citalopram (CELEXA) 40 MG tablet Take 40 mg by mouth daily.      . clopidogrel (PLAVIX) 75 MG tablet TAKE 1 TABLET EVERY DAY  90 tablet  0  . furosemide (LASIX) 40 MG tablet Take 40 mg by mouth 3 (three) times daily.      Marland Kitchen HYDROcodone-acetaminophen (VICODIN) 5-500 MG per tablet Take 1 tablet by mouth every 6 (six) hours as needed for pain.      Marland Kitchen KLOR-CON 10 10 MEQ tablet Take 10 mEq by mouth 2 (two) times daily.      . lansoprazole (PREVACID) 30 MG capsule as needed.      Marland Kitchen levothyroxine (SYNTHROID, LEVOTHROID) 112 MCG tablet Take 112 mcg by mouth daily before breakfast.      . metFORMIN (GLUCOPHAGE) 500 MG tablet Take 500 mg by mouth 2 (two) times daily.      . nortriptyline (PAMELOR) 50 MG capsule Take 50 mg by mouth daily.      . potassium chloride (K-DUR,KLOR-CON) 10 MEQ tablet Take 10 mEq by mouth 2 (two) times daily.      Marland Kitchen rOPINIRole (REQUIP) 0.5 MG tablet Take 0.5 mg by mouth daily.      . simvastatin (ZOCOR) 20 MG tablet Take 20 mg  by mouth every evening.       No current facility-administered medications for this visit.    Allergies  Allergen Reactions  . Valium [Diazepam]     History   Social History  . Marital Status: Married    Spouse Name: N/A    Number of Children: N/A  . Years of Education: N/A   Occupational History  . Not on file.   Social History Main Topics  . Smoking status: Current Every Day Smoker -- 1.00 packs/day    Types: Cigarettes    Start date: 12/26/1952  . Smokeless tobacco: Not on file  . Alcohol Use: No  . Drug Use: No  . Sexual Activity: Not on file   Other Topics Concern  . Not on file   Social History Narrative  . No narrative on file     Review of Systems: General: negative for chills, fever,  night sweats or weight changes.  Cardiovascular: negative for chest pain, dyspnea on exertion, edema, orthopnea, palpitations, paroxysmal nocturnal dyspnea or shortness of breath Dermatological: negative for rash Respiratory: negative for cough or wheezing Urologic: negative for hematuria Abdominal: negative for nausea, vomiting, diarrhea, bright red blood per rectum, melena, or hematemesis Neurologic: negative for visual changes, syncope, or dizziness All other systems reviewed and are otherwise negative except as noted above.    Blood pressure 132/76, pulse 93, height 5\' 6"  (1.676 m), weight 198 lb 12.8 oz (90.175 kg).  General appearance: alert and no distress Neck: no adenopathy, no carotid bruit, no JVD, supple, symmetrical, trachea midline and thyroid not enlarged, symmetric, no tenderness/mass/nodules Lungs: clear to auscultation bilaterally Heart: regular rate and rhythm, S1, S2 normal, no murmur, click, rub or gallop Extremities: extremities normal, atraumatic, no cyanosis or edema  EKG sinus rhythm at 93 with lipoma branch block unchanged from prior EKGs  ASSESSMENT AND PLAN:   CORONARY HEART DISEASE Status post remote RCA stenting with mild to moderate distal left main disease. She has been cathed multiple times by Drs. Tami Ribas, gamble and myself most recently January 2012. She had 50% in-stent restenosis within the RCA stent and 40% distal left main. This was done after a Myoview stress test showed anteroapical scar versus breast attenuation artifact. I saw her one year ago. Her last several weeks she's developed chest pain that is different from her prior pain that is "squeezing" occurring every other day lasting up to minutes at a time. Based on the fact that her Myoview stress test is abnormal at baseline and that she had distal left main disease, I feel compelled to proceed with cardiac catheterization to define her anatomy and rule out progression. I will do this radially. We  have discussed the risks and benefits.  HYPERTENSION Under good control and her medications  Hyperlipidemia On statin therapy followed by her PCP  Tobacco abuse Currently smoking one pack a day. Recalcitrant to risk factor modification.      Lorretta Harp MD FACP,FACC,FAHA, [facility] 12/26/2012 11:03 AM

## 2012-12-26 NOTE — Assessment & Plan Note (Signed)
Under good control and her medications 

## 2012-12-26 NOTE — Assessment & Plan Note (Addendum)
Currently smoking one pack a day. Recalcitrant to risk factor modification.

## 2012-12-27 ENCOUNTER — Ambulatory Visit (HOSPITAL_COMMUNITY)
Admission: RE | Admit: 2012-12-27 | Discharge: 2012-12-27 | Disposition: A | Discharge status: Home / Self Care | Payer: Medicare FFS | Source: Ambulatory Visit | Attending: Cardiovascular Disease | Admitting: Cardiovascular Disease

## 2012-12-27 ENCOUNTER — Encounter (HOSPITAL_COMMUNITY): Payer: Self-pay | Admitting: Pharmacy Technician

## 2012-12-27 ENCOUNTER — Encounter (HOSPITAL_COMMUNITY)
Admission: RE | Disposition: A | Discharge status: Home / Self Care | Payer: Self-pay | Source: Ambulatory Visit | Attending: Cardiovascular Disease

## 2012-12-27 DIAGNOSIS — Z79899 Other long term (current) drug therapy: Secondary | ICD-10-CM | POA: Insufficient documentation

## 2012-12-27 DIAGNOSIS — E119 Type 2 diabetes mellitus without complications: Secondary | ICD-10-CM

## 2012-12-27 DIAGNOSIS — F17200 Nicotine dependence, unspecified, uncomplicated: Secondary | ICD-10-CM | POA: Insufficient documentation

## 2012-12-27 DIAGNOSIS — E663 Overweight: Secondary | ICD-10-CM | POA: Insufficient documentation

## 2012-12-27 DIAGNOSIS — I2510 Atherosclerotic heart disease of native coronary artery without angina pectoris: Secondary | ICD-10-CM | POA: Insufficient documentation

## 2012-12-27 DIAGNOSIS — Y831 Surgical operation with implant of artificial internal device as the cause of abnormal reaction of the patient, or of later complication, without mention of misadventure at the time of the procedure: Secondary | ICD-10-CM | POA: Insufficient documentation

## 2012-12-27 DIAGNOSIS — I10 Essential (primary) hypertension: Secondary | ICD-10-CM | POA: Insufficient documentation

## 2012-12-27 DIAGNOSIS — I447 Left bundle-branch block, unspecified: Secondary | ICD-10-CM

## 2012-12-27 DIAGNOSIS — Z01818 Encounter for other preprocedural examination: Secondary | ICD-10-CM

## 2012-12-27 DIAGNOSIS — Z9861 Coronary angioplasty status: Secondary | ICD-10-CM | POA: Insufficient documentation

## 2012-12-27 DIAGNOSIS — E785 Hyperlipidemia, unspecified: Secondary | ICD-10-CM

## 2012-12-27 DIAGNOSIS — R0789 Other chest pain: Secondary | ICD-10-CM | POA: Insufficient documentation

## 2012-12-27 HISTORY — PX: LEFT HEART CATHETERIZATION WITH CORONARY ANGIOGRAM: SHX5451

## 2012-12-27 LAB — GLUCOSE, CAPILLARY
Glucose-Capillary: 184 mg/dL — ABNORMAL HIGH (ref 70–99)
Glucose-Capillary: 135 mg/dL — ABNORMAL HIGH (ref 70–99)

## 2012-12-27 SURGERY — LEFT HEART CATHETERIZATION WITH CORONARY ANGIOGRAM
Anesthesia: LOCAL

## 2012-12-27 MED ORDER — HEPARIN SODIUM (PORCINE) 1000 UNIT/ML IJ SOLN
INTRAMUSCULAR | Status: AC
  Filled 2012-12-27: qty 1

## 2012-12-27 MED ORDER — NITROGLYCERIN 0.2 MG/ML ON CALL CATH LAB
INTRAVENOUS | Status: AC
  Filled 2012-12-27: qty 1

## 2012-12-27 MED ORDER — SODIUM CHLORIDE 0.9 % IV SOLN
INTRAVENOUS | Status: DC
  Administered 2012-12-27: 10:00:00 via INTRAVENOUS

## 2012-12-27 MED ORDER — HEPARIN (PORCINE) IN NACL 2-0.9 UNIT/ML-% IJ SOLN
INTRAMUSCULAR | Status: AC
  Filled 2012-12-27: qty 1000

## 2012-12-27 MED ORDER — ONDANSETRON HCL 4 MG/2ML IJ SOLN: 4.0000 mg | Freq: Four times a day (QID) | INTRAMUSCULAR | Status: DC | PRN

## 2012-12-27 MED ORDER — ASPIRIN 81 MG PO CHEW
CHEWABLE_TABLET | ORAL | Status: AC
  Administered 2012-12-27: 81 mg via ORAL
  Filled 2012-12-27: qty 1

## 2012-12-27 MED ORDER — LIDOCAINE HCL (PF) 1 % IJ SOLN
INTRAMUSCULAR | Status: AC
  Filled 2012-12-27: qty 30

## 2012-12-27 MED ORDER — FENTANYL CITRATE 0.05 MG/ML IJ SOLN
INTRAMUSCULAR | Status: AC
  Filled 2012-12-27: qty 2

## 2012-12-27 MED ORDER — VERAPAMIL HCL 2.5 MG/ML IV SOLN
INTRAVENOUS | Status: AC
  Filled 2012-12-27: qty 2

## 2012-12-27 MED ORDER — MIDAZOLAM HCL 2 MG/2ML IJ SOLN
INTRAMUSCULAR | Status: AC
  Filled 2012-12-27: qty 2

## 2012-12-27 MED ORDER — ASPIRIN 81 MG PO CHEW
81.0000 mg | CHEWABLE_TABLET | ORAL | Status: AC
  Administered 2012-12-27: 81 mg via ORAL

## 2012-12-27 MED ORDER — SODIUM CHLORIDE 0.9 % IJ SOLN: 3.0000 mL | INTRAMUSCULAR | Status: DC | PRN

## 2012-12-27 MED ORDER — ACETAMINOPHEN 325 MG PO TABS: 650.0000 mg | ORAL_TABLET | ORAL | Status: DC | PRN

## 2012-12-27 MED ORDER — SODIUM CHLORIDE 0.9 % IV SOLN: INTRAVENOUS | Status: AC

## 2012-12-27 NOTE — Interval H&P Note (Signed)
Cath Lab Visit (complete for each Cath Lab visit)  Clinical Evaluation Leading to the Procedure:   ACS: no  Non-ACS:    Anginal Classification: CCS IV  Anti-ischemic medical therapy: Minimal Therapy (1 class of medications)  Non-Invasive Test Results: No non-invasive testing performed  Prior CABG: No previous CABG      History and Physical Interval Note:  12/27/2012 11:02 AM  Quetzal M Blankley  has presented today for surgery, with the diagnosis of cp  The various methods of treatment have been discussed with the patient and family. After consideration of risks, benefits and other options for treatment, the patient has consented to  Procedure(s): LEFT HEART CATHETERIZATION WITH CORONARY ANGIOGRAM (N/A) as a surgical intervention .  The patient's history has been reviewed, patient examined, no change in status, stable for surgery.  I have reviewed the patient's chart and labs.  Questions were answered to the patient's satisfaction.     Lorretta Harp

## 2012-12-27 NOTE — CV Procedure (Signed)
[name] is a 72 y.o. female    [id number] LOCATION:  FACILITY: [facility]  PHYSICIAN: Quay Burow, M.D. [birth year]   DATE OF PROCEDURE:  12/27/2012  DATE OF DISCHARGE:     CARDIAC CATHETERIZATION     History obtained from chart review.The patient is a 72 year old mildly overweight married Caucasian female, mother of 94, grandmother of 2 grandchildren, whom I last saw in the office April 13, 2010. She has a history of CAD, status post RCA stenting in the past. She was catheterized by Dr. Janene Madeira May 04, 2007, after abnormal Myoview revealing 30% to 40% eccentric distal left main. A proximal RCA stent was patent and her EF was normal. Other problems include hypertension, hyperlipidemia, and diabetes. She has chronic left bundle branch block. She smokes 1-1/2 packs of cigarettes a day. She has had back surgery by Dr. Lissa Merlin. Myoview stress test performed March 24, 2010, revealed new anteroapical scar, and because of shortness of breath I catheterized her March 31, 2010, revealing 50% "in-stent restenosis" within the RCA stent, which was smooth, 40% distal left main with anatomy unchanged from the prior study and normal LV function. She really denies chest pain or shortness of breath. Her most recent lab work, performed by Dr. Buddy Duty in June, revealed a total cholesterol of 138, LDL of 57, HDL of 44.  I saw her one year ago. Over the last several weeks she developed new onset chest pressure occurring every other day lasting for minutes at a time associated with diaphoresis. I'm concerned that she may have progression of her left main disease or and/or her RCA in-stent restenosis. Her Myoview performed prior to her last cath in January 2012 was on the telephone with anteroapical scar versus breast attenuation artifact. Based on her anatomy and her symptoms I feel compelled to proceed with an outpatient cardiac catheterization which we will perform a right radial  approach.    PROCEDURE DESCRIPTION:   The patient was brought to the second floor [facility] cath lab in the postabsorptive state. She was premedicated with Valium 5 mg by mouth, IV Versed and fentanyl. Her right wrist was prepped and shaved in usual sterile fashion. Xylocaine 1% was used for local anesthesia. A 6 French sheath was inserted into the right radial artery using standard Seldinger technique. The patient received 5000 units  of heparin  intravenously.  A 6 [place] TIG catheter and pigtail catheters were used for selective coronary angiography and left ventriculography respectively. Visipaque dye was used for the entirety of the case. retrogradedaortic, left ventricular end pullback pressures were recorded.    HEMODYNAMICS:    AO SYSTOLIC/AO DIASTOLIC: [id number]   LV SYSTOLIC/LV DIASTOLIC: 0000000  ANGIOGRAPHIC RESULTS:   1. Left main; 30-40% hypodense tapered distal  2. LAD; minor irregularities 3. Left circumflex; nondominant and free of significant disease. There was a small to medium-sized ramus branch which was also free of significant disease..  4. Right coronary artery; dominant with a widely patent stent. There appeared to be a arterial venous medication with injection in the right coronary sinus of unclear etiology and or significance. 5. Left ventriculography; RAO left ventriculogram was performed using  25 mL of Visipaque dye at 12 mL/second. The overall LVEF estimated  40-45 %  Without wall motion abnormalities  IMPRESSION:Ms. Deis has a widely patent RCA stent and mild distal left main disease. Her anatomy has unchanged since her last cath in 2011. She has mild to moderate LV dysfunction. I am unsure of  the etiology of her AV communication I will review this with colleagues. The sheath was removed and a TR Band  was placed on the right wrist to achieve patent hemostasis. The patient left the Cath Lab in stable condition. She will be hydrated for 2 hours, discharged  home and see me back in the office in 2-3 weeks.  Lorretta Harp MD, [facility] 12/27/2012 11:40 AM

## 2012-12-27 NOTE — H&P (View-Only) (Signed)
12/26/2012 Karina M Colford   11/11/1940  [id number]  Primary Physician Gildardo Cranker, MD Primary Cardiologist: Lorretta Harp MD [name]   HPI:  The patient is a 72 year old mildly overweight married Caucasian female, mother of 60, grandmother of 2 grandchildren, whom I last saw in the office April 13, 2010. She has a history of CAD, status post RCA stenting in the past. She was catheterized by Dr. Janene Madeira May 04, 2007, after abnormal Myoview revealing 30% to 40% eccentric distal left main. A proximal RCA stent was patent and her EF was normal. Other problems include hypertension, hyperlipidemia, and diabetes. She has chronic left bundle branch block. She smokes 1-1/2 packs of cigarettes a day. She has had back surgery by Dr. Lissa Merlin. Myoview stress test performed March 24, 2010, revealed new anteroapical scar, and because of shortness of breath I catheterized her March 31, 2010, revealing 50% "in-stent restenosis" within the RCA stent, which was smooth, 40% distal left main with anatomy unchanged from the prior study and normal LV function. She really denies chest pain or shortness of breath. Her most recent lab work, performed by Dr. Buddy Duty in June, revealed a total cholesterol of 138, LDL of 57, HDL of 44. I saw her one year ago. Over the last several weeks she developed new onset chest pressure occurring every other day lasting for minutes at a time associated with diaphoresis. I'm concerned that she may have progression of her left main disease or and/or her RCA in-stent restenosis. Her Myoview performed prior to her last cath in January 2012 was on the telephone with anteroapical scar versus breast attenuation artifact. Based on her anatomy and her symptoms I feel compelled to proceed with an outpatient cardiac catheterization which we will perform a right radial approach.     Current Outpatient Prescriptions  Medication Sig Dispense Refill  . ALPRAZolam (XANAX)  1 MG tablet Take 1 mg by mouth 4 (four) times daily.      Marland Kitchen aspirin 81 MG tablet Take 81 mg by mouth daily.      . carvedilol (COREG) 6.25 MG tablet Take 1 tablet (6.25 mg total) by mouth 2 (two) times daily with a meal.  180 tablet  0  . citalopram (CELEXA) 40 MG tablet Take 40 mg by mouth daily.      . clopidogrel (PLAVIX) 75 MG tablet TAKE 1 TABLET EVERY DAY  90 tablet  0  . furosemide (LASIX) 40 MG tablet Take 40 mg by mouth 3 (three) times daily.      Marland Kitchen HYDROcodone-acetaminophen (VICODIN) 5-500 MG per tablet Take 1 tablet by mouth every 6 (six) hours as needed for pain.      Marland Kitchen KLOR-CON 10 10 MEQ tablet Take 10 mEq by mouth 2 (two) times daily.      . lansoprazole (PREVACID) 30 MG capsule as needed.      Marland Kitchen levothyroxine (SYNTHROID, LEVOTHROID) 112 MCG tablet Take 112 mcg by mouth daily before breakfast.      . metFORMIN (GLUCOPHAGE) 500 MG tablet Take 500 mg by mouth 2 (two) times daily.      . nortriptyline (PAMELOR) 50 MG capsule Take 50 mg by mouth daily.      . potassium chloride (K-DUR,KLOR-CON) 10 MEQ tablet Take 10 mEq by mouth 2 (two) times daily.      Marland Kitchen rOPINIRole (REQUIP) 0.5 MG tablet Take 0.5 mg by mouth daily.      . simvastatin (ZOCOR) 20 MG tablet Take 20 mg  by mouth every evening.       No current facility-administered medications for this visit.    Allergies  Allergen Reactions  . Valium [Diazepam]     History   Social History  . Marital Status: Married    Spouse Name: N/A    Number of Children: N/A  . Years of Education: N/A   Occupational History  . Not on file.   Social History Main Topics  . Smoking status: Current Every Day Smoker -- 1.00 packs/day    Types: Cigarettes    Start date: 12/26/1952  . Smokeless tobacco: Not on file  . Alcohol Use: No  . Drug Use: No  . Sexual Activity: Not on file   Other Topics Concern  . Not on file   Social History Narrative  . No narrative on file     Review of Systems: General: negative for chills, fever,  night sweats or weight changes.  Cardiovascular: negative for chest pain, dyspnea on exertion, edema, orthopnea, palpitations, paroxysmal nocturnal dyspnea or shortness of breath Dermatological: negative for rash Respiratory: negative for cough or wheezing Urologic: negative for hematuria Abdominal: negative for nausea, vomiting, diarrhea, bright red blood per rectum, melena, or hematemesis Neurologic: negative for visual changes, syncope, or dizziness All other systems reviewed and are otherwise negative except as noted above.    Blood pressure 132/76, pulse 93, height 5\' 6"  (1.676 m), weight 198 lb 12.8 oz (90.175 kg).  General appearance: alert and no distress Neck: no adenopathy, no carotid bruit, no JVD, supple, symmetrical, trachea midline and thyroid not enlarged, symmetric, no tenderness/mass/nodules Lungs: clear to auscultation bilaterally Heart: regular rate and rhythm, S1, S2 normal, no murmur, click, rub or gallop Extremities: extremities normal, atraumatic, no cyanosis or edema  EKG sinus rhythm at 93 with lipoma branch block unchanged from prior EKGs  ASSESSMENT AND PLAN:   CORONARY HEART DISEASE Status post remote RCA stenting with mild to moderate distal left main disease. She has been cathed multiple times by Drs. Tami Ribas, gamble and myself most recently January 2012. She had 50% in-stent restenosis within the RCA stent and 40% distal left main. This was done after a Myoview stress test showed anteroapical scar versus breast attenuation artifact. I saw her one year ago. Her last several weeks she's developed chest pain that is different from her prior pain that is "squeezing" occurring every other day lasting up to minutes at a time. Based on the fact that her Myoview stress test is abnormal at baseline and that she had distal left main disease, I feel compelled to proceed with cardiac catheterization to define her anatomy and rule out progression. I will do this radially. We  have discussed the risks and benefits.  HYPERTENSION Under good control and her medications  Hyperlipidemia On statin therapy followed by her PCP  Tobacco abuse Currently smoking one pack a day. Recalcitrant to risk factor modification.      Lorretta Harp MD FACP,FACC,FAHA, [facility] 12/26/2012 11:03 AM

## 2013-01-01 ENCOUNTER — Encounter: Payer: Self-pay | Admitting: *Deleted

## 2013-01-10 ENCOUNTER — Encounter: Payer: Self-pay | Admitting: Cardiovascular Disease

## 2013-01-10 ENCOUNTER — Ambulatory Visit (INDEPENDENT_AMBULATORY_CARE_PROVIDER_SITE_OTHER): Payer: Medicare FFS | Admitting: Cardiovascular Disease

## 2013-01-10 ENCOUNTER — Ambulatory Visit: Payer: Medicare FFS | Admitting: Cardiovascular Disease

## 2013-01-10 VITALS — BP 110/70 | HR 92 | Ht 66.0 in | Wt 197.0 lb

## 2013-01-10 DIAGNOSIS — E785 Hyperlipidemia, unspecified: Secondary | ICD-10-CM

## 2013-01-10 DIAGNOSIS — I2510 Atherosclerotic heart disease of native coronary artery without angina pectoris: Secondary | ICD-10-CM

## 2013-01-10 DIAGNOSIS — Z79899 Other long term (current) drug therapy: Secondary | ICD-10-CM

## 2013-01-10 DIAGNOSIS — I10 Essential (primary) hypertension: Secondary | ICD-10-CM

## 2013-01-10 MED ORDER — CARVEDILOL 12.5 MG PO TABS: 12.5000 mg | ORAL_TABLET | Freq: Two times a day (BID) | ORAL | Status: DC

## 2013-01-10 NOTE — Assessment & Plan Note (Addendum)
I. Repeat cathed her on 12/27/12 really a patent RCA stent with mild left main disease and low normal ejection fraction with an EF in the 45% range. 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 There is no obvious etiology for her chest pain. I have reassured her. I'm going to increase her carvedilol to 6.25-12.5 mg twice a day.

## 2013-01-10 NOTE — Progress Notes (Signed)
01/10/2013 [name]   24-Jul-1940  [id number]  Primary Physician Gildardo Cranker, MD Primary Cardiologist: Lorretta Harp MD [name]   HPI:  The patient is a 72 year old mildly overweight married Caucasian female, mother of 86, grandmother of 2 grandchildren, whom I last saw in the office April 13, 2010. She has a history of CAD, status post RCA stenting in the past. She was catheterized by Dr. Janene Madeira May 04, 2007, after abnormal Myoview revealing 30% to 40% eccentric distal left main. A proximal RCA stent was patent and her EF was normal. Other problems include hypertension, hyperlipidemia, and diabetes. She has chronic left bundle branch block. She smokes 1-1/2 packs of cigarettes a day. She has had back surgery by Dr. Lissa Merlin. Myoview stress test performed March 24, 2010, revealed new anteroapical scar, and because of shortness of breath I catheterized her March 31, 2010, revealing 50% "in-stent restenosis" within the RCA stent, which was smooth, 40% distal left main with anatomy unchanged from the prior study and normal LV function. She really denies chest pain or shortness of breath. Her most recent lab work, performed by Dr. Buddy Duty in June, revealed a total cholesterol of 138, LDL of 57, HDL of 44.  I saw her one year ago. Over the last several weeks she developed new onset chest pressure occurring every other day lasting for minutes at a time associated with diaphoresis. I'm concerned that she may have progression of her left main disease or and/or her RCA in-stent restenosis. Her Myoview performed prior to her last cath in January 2012 was remarkable for anteroapical scar versus breast attenuation artifact. Based on her anatomy and her symptoms I elected to proceed with outpatient diagnostic coronary arteriography which I performed on 12/26/12 revealing essentially unchanged anatomy. Her RCA stent was patent her left main was mild. She did have mild to moderate  left ventricular dysfunction with an EF in the 45% range. 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    Current Outpatient Prescriptions  Medication Sig Dispense Refill  . ALPRAZolam (XANAX) 1 MG tablet Take 1 mg by mouth 4 (four) times daily as needed.       Marland Kitchen aspirin 81 MG tablet Take 81 mg by mouth daily.      . carvedilol (COREG) 12.5 MG tablet Take 1 tablet (12.5 mg total) by mouth 2 (two) times daily with a meal.  180 tablet  3  . citalopram (CELEXA) 40 MG tablet Take 40 mg by mouth daily.      . clopidogrel (PLAVIX) 75 MG tablet Take 75 mg by mouth daily.      . furosemide (LASIX) 40 MG tablet Take 40 mg by mouth 3 (three) times daily.      Marland Kitchen HYDROcodone-acetaminophen (NORCO/VICODIN) 5-325 MG per tablet Take 1 tablet by mouth every 6 (six) hours as needed for moderate pain.      Marland Kitchen ketoconazole (NIZORAL) 2 % cream Apply 1 application topically daily as needed (to feet).      Marland Kitchen levothyroxine (SYNTHROID, LEVOTHROID) 112 MCG tablet Take 112 mcg by mouth daily before breakfast.      . metFORMIN (GLUCOPHAGE) 500 MG tablet Take 500 mg by mouth 2 (two) times daily.      . potassium chloride (K-DUR,KLOR-CON) 10 MEQ tablet Take 10 mEq by mouth 2 (two) times daily.      Marland Kitchen rOPINIRole (REQUIP) 0.5 MG tablet Take 0.5 mg by mouth daily.      Marland Kitchen  simvastatin (ZOCOR) 20 MG tablet Take 20 mg by mouth every evening.      . nortriptyline (PAMELOR) 50 MG capsule Take 50 mg by mouth daily.       No current facility-administered medications for this visit.    Allergies  Allergen Reactions  . Valium [Diazepam] Swelling    face    History   Social History  . Marital Status: Married    Spouse Name: N/A    Number of Children: N/A  . Years of Education: N/A   Occupational History  . Not on file.   Social History Main Topics  . Smoking status: Current Every Day Smoker -- 1.00 packs/day for 58 years    Types: Cigarettes    Start date: 12/26/1952  . Smokeless tobacco:  Never Used  . Alcohol Use: No  . Drug Use: No  . Sexual Activity: Not on file   Other Topics Concern  . Not on file   Social History Narrative  . No narrative on file     Review of Systems: General: negative for chills, fever, night sweats or weight changes.  Cardiovascular: negative for chest pain, dyspnea on exertion, edema, orthopnea, palpitations, paroxysmal nocturnal dyspnea or shortness of breath Dermatological: negative for rash Respiratory: negative for cough or wheezing Urologic: negative for hematuria Abdominal: negative for nausea, vomiting, diarrhea, bright red blood per rectum, melena, or hematemesis Neurologic: negative for visual changes, syncope, or dizziness All other systems reviewed and are otherwise negative except as noted above.    Blood pressure 110/70, pulse 92, height 5\' 6"  (1.676 m), weight 197 lb (89.359 kg).  General appearance: alert and no distress Neck: no adenopathy, no carotid bruit, no JVD, supple, symmetrical, trachea midline and thyroid not enlarged, symmetric, no tenderness/mass/nodules Lungs: clear to auscultation bilaterally Heart: regular rate and rhythm, S1, S2 normal, no murmur, click, rub or gallop Extremities: extremities normal, atraumatic, no cyanosis or edema  EKG normal sinus rhythm 92 with a bundle-branch block  ASSESSMENT AND PLAN:   Hyperlipidemia On statin therapy. We will recheck a lipid and liver profile  CORONARY HEART DISEASE I. Repeat cathed her on 12/27/12 really a patent RCA stent with mild left main disease and low normal ejection fraction with an EF in the 45% range. 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 There is no obvious etiology for her chest pain. I have reassured her. I'm going to increase her carvedilol to 6.25-12.5 mg twice a day.      Lorretta Harp MD FACP,FACC,FAHA, [facility] 01/10/2013 3:10  PM

## 2013-01-10 NOTE — Assessment & Plan Note (Signed)
On statin therapy. We will recheck a lipid and liver profile 

## 2013-01-10 NOTE — Patient Instructions (Addendum)
Your physician wants you to follow-up in: 3 months with an extender and 6 months with Dr Gwenlyn Found. You will receive a reminder letter in the mail two months in advance. If you don't receive a letter, please call our office to schedule the follow-up appointment.  Dr Gwenlyn Found wants you to increase the Coreg to 12.5mg  twice a day.  (prescription was sent to [facility])  Please have blood work done at your convenience, fasting.

## 2013-01-11 ENCOUNTER — Encounter: Payer: Self-pay | Admitting: Cardiovascular Disease

## 2013-02-11 ENCOUNTER — Other Ambulatory Visit: Payer: Self-pay | Admitting: *Deleted

## 2013-02-11 MED ORDER — CLOPIDOGREL BISULFATE 75 MG PO TABS: 75.0000 mg | ORAL_TABLET | Freq: Every day | ORAL | Status: DC

## 2013-02-11 NOTE — Telephone Encounter (Signed)
Refilled plavix to [facility].

## 2013-05-01 ENCOUNTER — Encounter: Payer: Self-pay | Admitting: Physician Assistant

## 2013-05-01 ENCOUNTER — Ambulatory Visit (INDEPENDENT_AMBULATORY_CARE_PROVIDER_SITE_OTHER): Payer: Medicare FFS | Admitting: Physician Assistant

## 2013-05-01 VITALS — BP 110/60 | HR 88 | Ht 66.0 in | Wt 201.0 lb

## 2013-05-01 DIAGNOSIS — Z720 Tobacco use: Secondary | ICD-10-CM

## 2013-05-01 DIAGNOSIS — I10 Essential (primary) hypertension: Secondary | ICD-10-CM

## 2013-05-01 DIAGNOSIS — R079 Chest pain, unspecified: Secondary | ICD-10-CM

## 2013-05-01 DIAGNOSIS — F17200 Nicotine dependence, unspecified, uncomplicated: Secondary | ICD-10-CM

## 2013-05-01 DIAGNOSIS — I2510 Atherosclerotic heart disease of native coronary artery without angina pectoris: Secondary | ICD-10-CM

## 2013-05-01 DIAGNOSIS — I447 Left bundle-branch block, unspecified: Secondary | ICD-10-CM

## 2013-05-01 MED ORDER — NITROGLYCERIN 0.4 MG SL SUBL: 0.4000 mg | SUBLINGUAL_TABLET | SUBLINGUAL | Status: DC | PRN

## 2013-05-01 NOTE — Patient Instructions (Signed)
1.  Follow up with Dr. Gwenlyn Found in May.

## 2013-05-01 NOTE — Assessment & Plan Note (Signed)
BP well controlled.

## 2013-05-01 NOTE — Assessment & Plan Note (Signed)
She is thinking of trying hypnotism but it is expensive.

## 2013-05-01 NOTE — Progress Notes (Signed)
Date:  05/01/2013   ID:  [name], DOB Oct 22, 1940, MRN [id number]  PCP:  Gildardo Cranker, MD  Primary Cardiologist:  Gwenlyn Found     History of Present Illness: [name] is a 73 y.o. female who is mildly overweight.  She is married, mother of 65, grandmother of 2 grandchildren, who saw Dr Gwenlyn Found on January 10, 2013.  She has a history of CAD, status post RCA stenting in the past. She was catheterized by Dr. Janene Madeira May 04, 2007, after abnormal Myoview revealing 30% to 40% eccentric distal left main. A proximal RCA stent was patent and her EF was normal. Other problems include hypertension, hyperlipidemia, and diabetes. She has chronic left bundle branch block. She smokes 1-1/2 packs of cigarettes a day. She has had back surgery by Dr. Lissa Merlin. Myoview stress test performed March 24, 2010, revealed new anteroapical scar, and because of shortness of breath Dr. Gwenlyn Found catheterized her March 31, 2010, revealing 50% "in-stent restenosis" within the RCA stent, which was smooth, 40% distal left main with anatomy unchanged from the prior study and normal LV function.  He also cathed her last October which revealed essentially unchanged anatomy. Her RCA stent was patent her left main was mild. She did have mild to moderate left ventricular dysfunction with an EF in the 45% range. 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 Buddy Duty in June, revealed a total cholesterol of 138, LDL of 57, HDL of 44.   She presents today for evaluation of Cp.  It occurs once and awhile and only last a split second.  No N, V or SOB.  She does get sweaty some times with little exertion bu it is not related to CP.Marland Kitchen  She has been gaining weight and her PCP recently increased her thyroid medicine.  The patient currently denies  fever,  orthopnea, dizziness, PND, cough, congestion, abdominal pain, hematochezia, melena, lower extremity edema, claudication.  Wt  Readings from Last 3 Encounters:  05/01/13 201 lb (91.173 kg)  01/10/13 197 lb (89.359 kg)  12/27/12 198 lb (89.812 kg)     Past Medical History  Diagnosis Date  . Syncope and collapse     CAROTID DOPPLER, 08/21/2009 - no significant stenosis demonstrated  . LBBB (left bundle branch block)     NUCLEAR STRESS TEST, 03/24/2010 - significant perfusion defect due to infarct/scar with mild perinfarct ischemia seen in Mid Anterior, Mid Anteroseptal, Apical Anterior, Apical Septal, Apical and Septal regions, post stress EF 63%  . Syncope     2D ECHO, 08/21/2009 - EF 45-50%, normal  . OSA (obstructive sleep apnea)     AHI-9.77/hr, during REM-50.32/hr  . Coronary artery disease   . Hypertension   . Hyperlipidemia   . Diabetes   . Tobacco abuse     Current Outpatient Prescriptions  Medication Sig Dispense Refill  . ACCU-CHEK SOFTCLIX LANCETS lancets       . AFLURIA PRESERVATIVE FREE injection       . ALPRAZolam (XANAX) 1 MG tablet Take 1 mg by mouth 4 (four) times daily as needed.       Marland Kitchen aspirin 81 MG tablet Take 81 mg by mouth daily.      . carvedilol (COREG) 12.5 MG tablet Take 1 tablet (12.5 mg total) by mouth 2 (two) times daily with a meal.  180 tablet  3  . citalopram (CELEXA) 40 MG tablet Take 40 mg by mouth daily.      Marland Kitchen  clopidogrel (PLAVIX) 75 MG tablet Take 1 tablet (75 mg total) by mouth daily.  90 tablet  1  . furosemide (LASIX) 40 MG tablet Take 40 mg by mouth 3 (three) times daily.      Marland Kitchen HYDROcodone-acetaminophen (NORCO/VICODIN) 5-325 MG per tablet Take 1 tablet by mouth every 6 (six) hours as needed for moderate pain.      Marland Kitchen ketoconazole (NIZORAL) 2 % cream Apply 1 application topically daily as needed (to feet).      Marland Kitchen levothyroxine (SYNTHROID, LEVOTHROID) 112 MCG tablet Take 112 mcg by mouth daily before breakfast.      . metFORMIN (GLUCOPHAGE) 500 MG tablet Take 500 mg by mouth 2 (two) times daily.      . nortriptyline (PAMELOR) 50 MG capsule Take 50 mg by mouth daily.       . pantoprazole (PROTONIX) 40 MG tablet       . rOPINIRole (REQUIP) 0.5 MG tablet Take 0.5 mg by mouth daily.      . simvastatin (ZOCOR) 20 MG tablet Take 20 mg by mouth every evening.      . nitroGLYCERIN (NITROSTAT) 0.4 MG SL tablet Place 1 tablet (0.4 mg total) under the tongue every 5 (five) minutes as needed for chest pain.  25 tablet  3  . potassium chloride (K-DUR) 10 MEQ tablet        No current facility-administered medications for this visit.    Allergies:    Allergies  Allergen Reactions  . Valium [Diazepam] Swelling    face    Social History:  The patient  reports that she has been smoking Cigarettes.  She started smoking about 60 years ago. She has a 58 pack-year smoking history. She has never used smokeless tobacco. She reports that she does not drink alcohol or use illicit drugs.   Family history:   Family History  Problem Relation Age of Onset  . Stroke Mother   . Hypertension Mother     ROS:  Please see the history of present illness.  All other systems reviewed and negative.   PHYSICAL EXAM: VS:  BP 110/60  Pulse 88  Ht 5\' 6"  (1.676 m)  Wt 201 lb (91.173 kg)  BMI 32.46 kg/m2 Well nourished, well developed, in no acute distress HEENT: Pupils are equal round react to light accommodation extraocular movements are intact.  Neck: no JVDNo cervical lymphadenopathy. Cardiac: Regular rate and rhythm without murmurs rubs or gallops. Lungs:  clear to auscultation bilaterally, no wheezing, rhonchi or rales Abd: soft, nontender, positive bowel sounds all quadrants, no hepatosplenomegaly Ext: no lower extremity edema.  2+ radial and dorsalis pedis/PT pulses. Skin: warm and dry Neuro:  Grossly normal  EKG:  LBBB 88bpm.  No change from prior.  ASSESSMENT AND PLAN:  Problem List Items Addressed This Visit   HYPERTENSION     BP well controlled.    Relevant Medications      nitroGLYCERIN (NITROSTAT) SL tablet   CORONARY HEART DISEASE   Tobacco abuse     She is  thinking of trying hypnotism but it is expensive.     Left bundle branch block   Chest pain     This sound noncardiac and with recent LHC with unchanged anatomy it is even less likely.  Of course, she continues to smoke!  I did provide her with NTGSL so she has it in her purse.     Other Visit Diagnoses   CAD (coronary artery disease)    -  Primary  Relevant Orders       EKG 12-Lead

## 2013-05-01 NOTE — Assessment & Plan Note (Signed)
This sound noncardiac and with recent LHC with unchanged anatomy it is even less likely.  Of course, she continues to smoke!  I did provide her with NTGSL so she has it in her purse.

## 2013-07-10 ENCOUNTER — Telehealth: Payer: Self-pay | Admitting: Cardiovascular Disease

## 2013-07-12 NOTE — Telephone Encounter (Signed)
Closed encounter °

## 2013-07-27 IMAGING — CT CT L SPINE W/ CM
5 of 6 series · 14 of 33 positions shown, 16 images · IV contrast (omnipaque)
Comparison: Lumbar MRI 03/10/2010 and earlier.

CLINICAL DATA: 69-year-old female with back pain radiating down
both lower extremities left greater than right.  Pain in the low
back is worse on the right. Prior lumbar surgery L3-L4.

MYELOGRAM LUMBAR
TECHNIQUE: Intrathecal contrast was administered by Dr. Dr. Ronlor
Conce  via lumbar puncture at the L2-L3 level. Following injection
of intrathecal Omnipaque contrast, spine imaging in multiple
projections was performed using fluoroscopy.
Fluoroscopy Time: 0.5 minutes.
TECHNIQUE: CT imaging of the lumbar spine was performed after
intrathecal contrast administration.  Multiplanar CT image
reconstructions were also generated.

[Series 4: 2mm axial soft tissue · axial · 0.30mm/px · z∈[-277,-133]mm · 4 of 120 slices shown]
[im 24/120  soft-tissue]
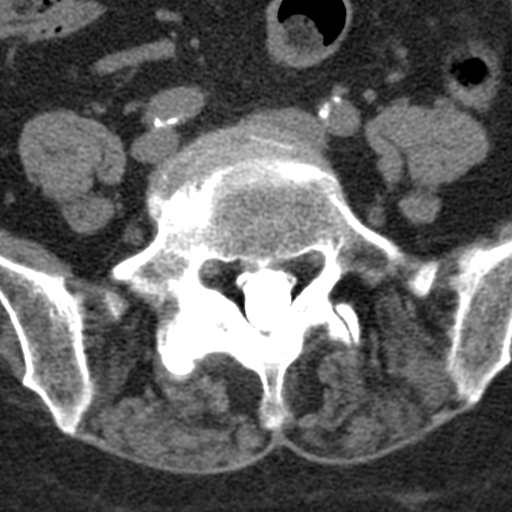
[im 48/120  soft-tissue]
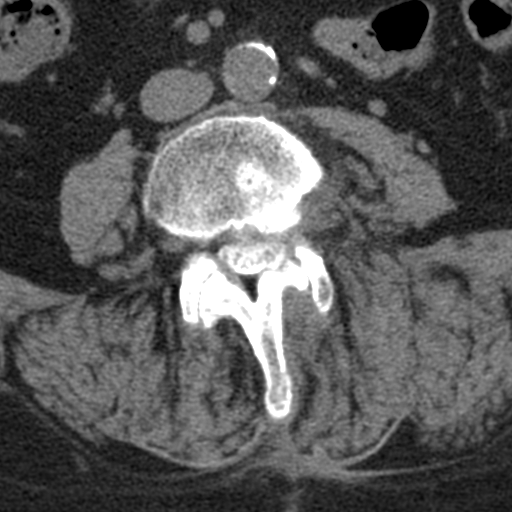
[im 72/120  soft-tissue]
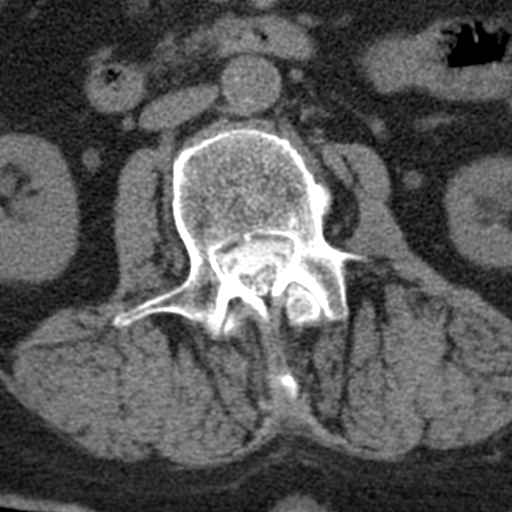
[im 96/120  soft-tissue]
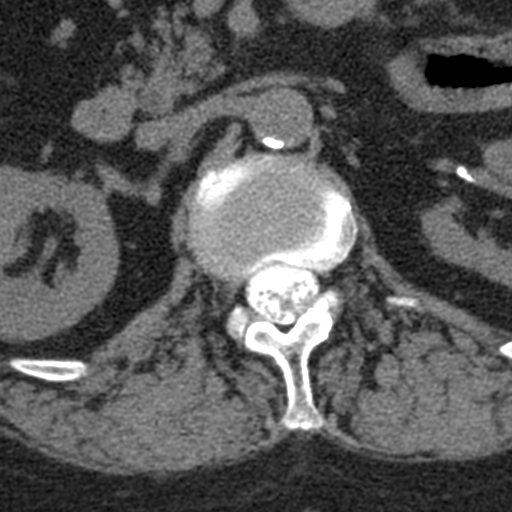

[Series 602: coronals · coronal · 0.47mm/px · 1 of 34 slices shown]
[im 17/34  bone]
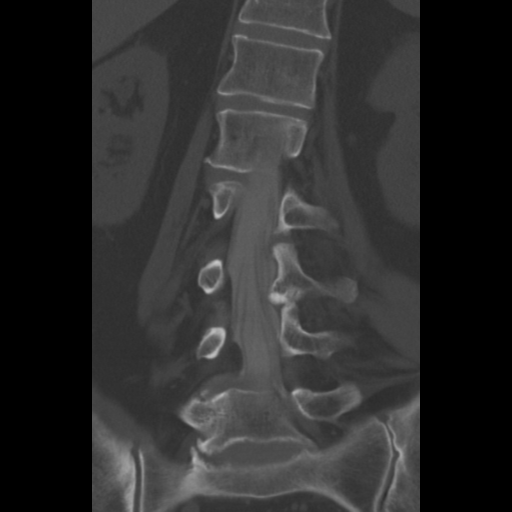

[Series 603: orthogs · axial · 0.47mm/px · z∈[-261,-190]mm · 2 of 70 slices shown]
[im 24/70  bone]
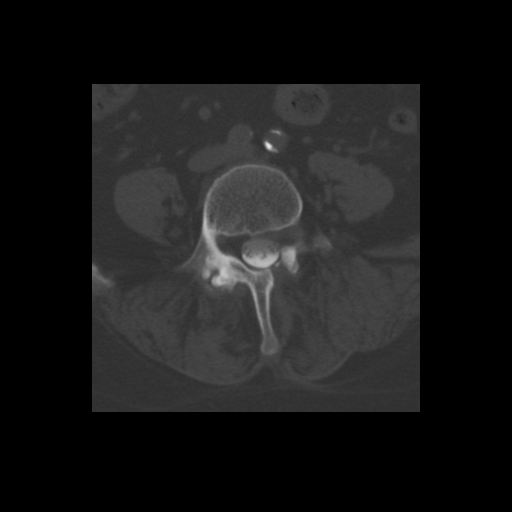
[im 47/70  bone]
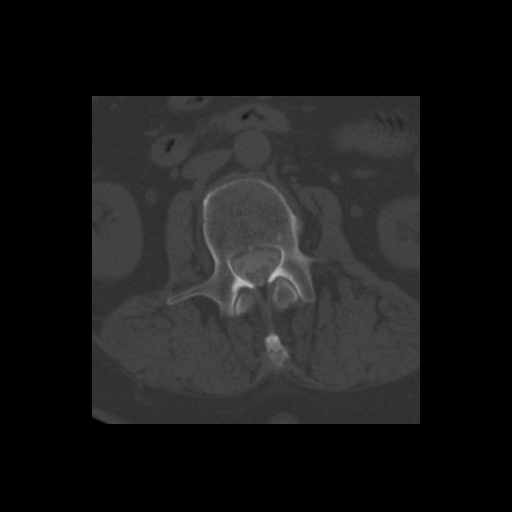

[Series 604: sagittals · sagittal · 0.47mm/px · 5 of 41 slices shown, 6 images]
[im 14/41  bone]
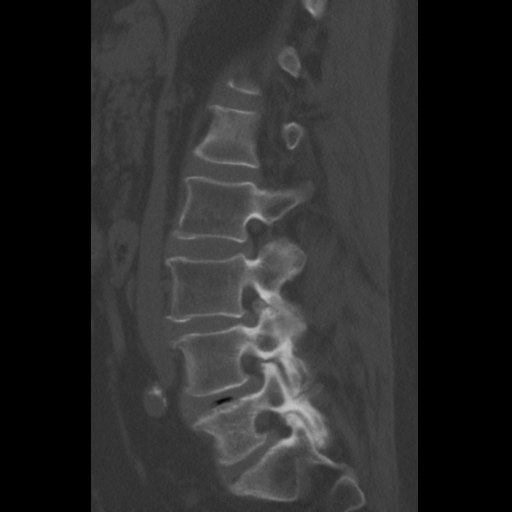
[im 17/41  bone]
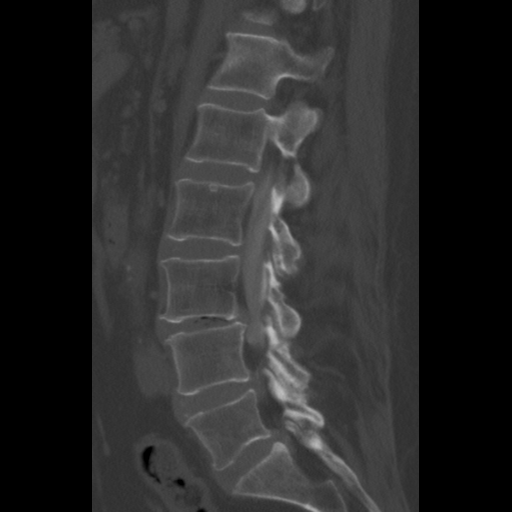
[im 21/41  soft-tissue]
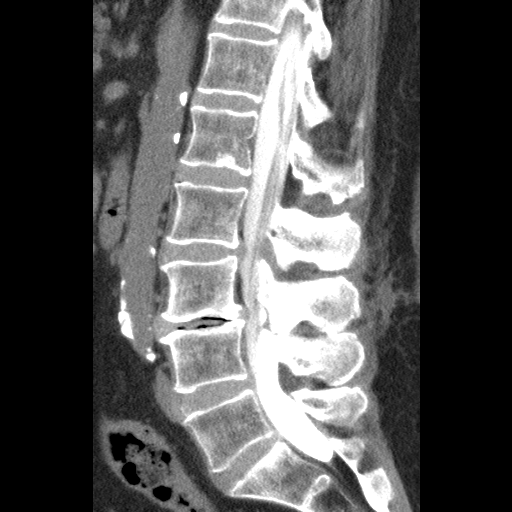
[im 21/41  bone]
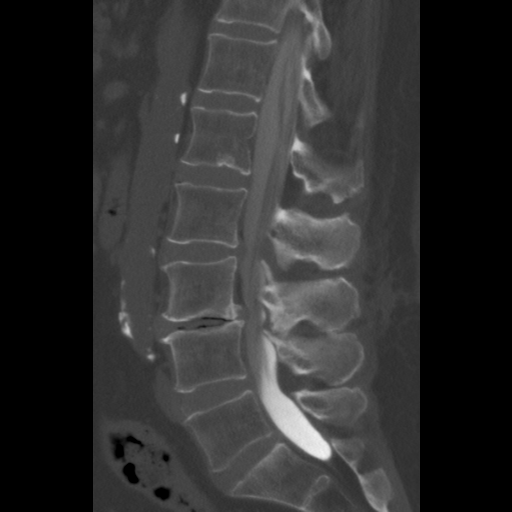
[im 24/41  bone]
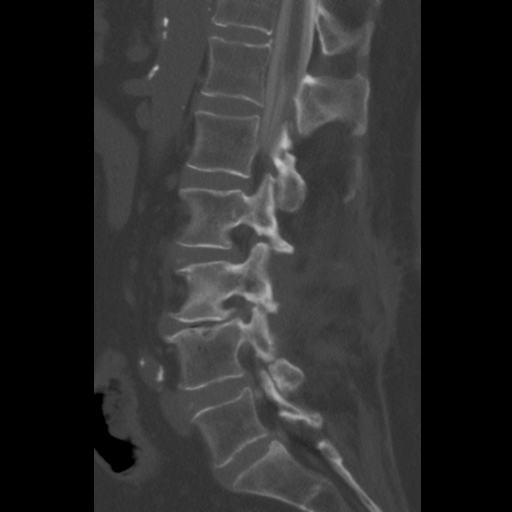
[im 27/41  bone]
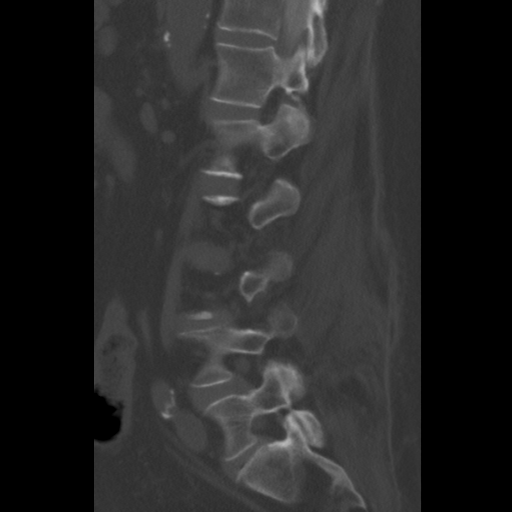

[Series 606: bone orthog · axial · 0.47mm/px · z∈[-258,-185]mm · 2 of 74 slices shown, 3 images]
[im 25/74  soft-tissue]
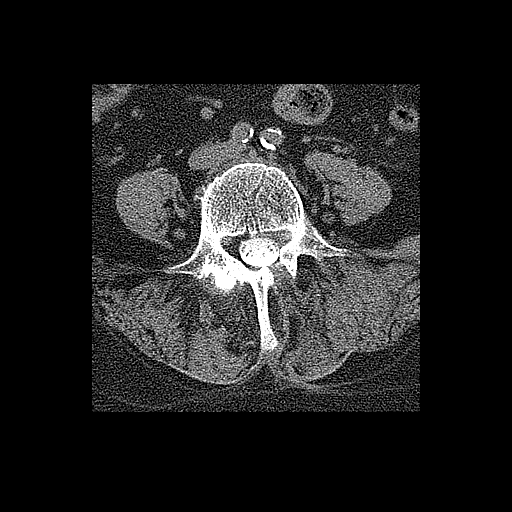
[im 25/74  bone]
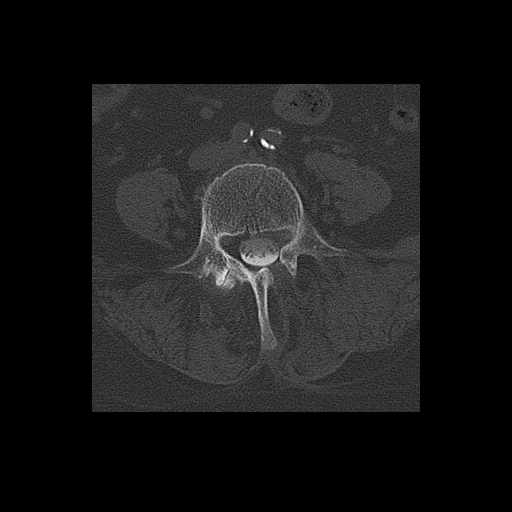
[im 49/74  bone]
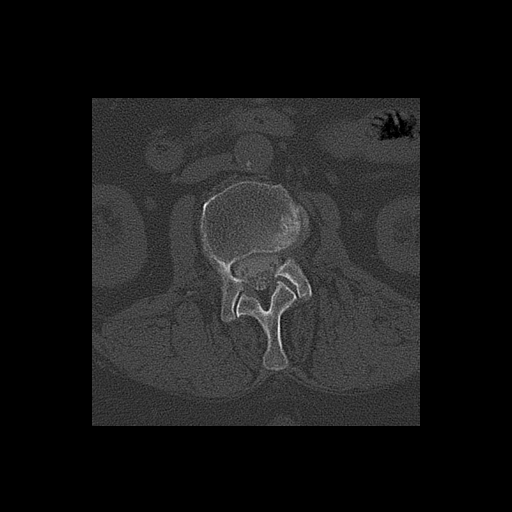

[14 of 33 positions shown; findings below may reference images not displayed]

FINDINGS: Good intrathecal contrast opacification. Mild
dextroconvex lumbar scoliosis.  Hypoplastic twelfth ribs
corresponding to the prior numbering system.

Large ventral defect on the thecal sac at L3-L4.  Mild ventral
defects at L2-L3 and L4-L5.  Circumferential narrowing of the
thecal sac at L3-L4 suggestive of moderate spinal stenosis.  Trace
anterolisthesis at L3-L4.  Upright views in neutral, flexion and
extension positioning.  Anterolisthesis does appear mildly
progressed in flexion.  Thecal sac findings appear stable
throughout.  Oblique views suggest left lateral recess stenosis at
L2-L3 and bilateral lateral recess stenosis at L3-L4.
IMPRESSION: 1.  Moderate spinal stenosis at L3-L4.  Trace anterolisthesis at
this level with evidence of mild abnormal motion in flexion.
2.  Left lateral recess stenosis suspected at L2-L3.
3.  Mild dextroconvex scoliosis.
4. See post myelogram CT findings below.

CT MYELOGRAPHY LUMBAR SPINE
FINDINGS: Sequelae of cholecystectomy.  Calcified atherosclerosis
of the aorta and its branches.  Other visualized abdominal and
pelvic viscera are within normal limits.  Postoperative changes to
the left paraspinal and posterior soft tissues at L3-L4 with no
adverse features.

Good intrathecal contrast.  Conus medullaris appears normal at T12-
L1.

T11-T12:  Negative.

T12-L1:  Negative.

L1-L2:  Negative.

L2-L3:  Chronic broad-based left eccentric and far lateral disc
protrusion.  Mild facet hypertrophy. Chronic mild to moderate left
lateral recess stenosis (left L3 nerve level, series 4 image 59).
No significant spinal stenosis.  Mild left L2 foraminal stenosis.

L3-L4:  Trace anterolisthesis with a mild counterclockwise rotatory
component.  Vacuum disc phenomena.  Chronic disc space narrowing.
Sequelae of partial left laminectomy.  Residual facet hypertrophy
with vacuum facet phenomenon on the right.  Circumferential disc
bulge with superimposed central disc protrusion.  Overall mild
spinal stenosis.  Moderate multifactorial left L3 foraminal
stenosis.

L4-L5:  Severe facet hypertrophy greater on the right.  Mild
circumferential disc bulge and facet hypertrophy.  No significant
stenosis.

L5-S1:  Severe facet hypertrophy greater on the right.  Mild
circumferential disc osteophyte complex but no significant
stenosis.
IMPRESSION: 1.  Chronic left lateral recess stenosis at L2-L3 is multifactorial
and appears stable from the prior MRI.
2.  Chronic severe disc and facet degeneration at L3-L4 with trace
anterolisthesis and slight rotatory component.  Prior partial
laminectomy on the left at this level.  Residual mild spinal
stenosis.
3.  Severe facet degeneration at L4-L5 and L5-S1 (greater on the
right).

Study reviewed in person with Dr. Eng Osama Brens at the time of
dictation.

## 2013-08-07 ENCOUNTER — Ambulatory Visit: Payer: Medicare FFS | Admitting: Cardiovascular Disease

## 2013-08-08 ENCOUNTER — Other Ambulatory Visit: Payer: Self-pay | Admitting: Cardiovascular Disease

## 2013-08-12 NOTE — Telephone Encounter (Signed)
Rx refill sent to patient pharmacy   

## 2013-08-14 ENCOUNTER — Ambulatory Visit (INDEPENDENT_AMBULATORY_CARE_PROVIDER_SITE_OTHER): Payer: Medicare FFS | Admitting: Cardiovascular Disease

## 2013-08-14 ENCOUNTER — Encounter: Payer: Self-pay | Admitting: Cardiovascular Disease

## 2013-08-14 VITALS — BP 122/60 | HR 93 | Ht 66.0 in | Wt 192.4 lb

## 2013-08-14 DIAGNOSIS — Z720 Tobacco use: Secondary | ICD-10-CM

## 2013-08-14 DIAGNOSIS — E785 Hyperlipidemia, unspecified: Secondary | ICD-10-CM

## 2013-08-14 DIAGNOSIS — I10 Essential (primary) hypertension: Secondary | ICD-10-CM

## 2013-08-14 DIAGNOSIS — I739 Peripheral vascular disease, unspecified: Secondary | ICD-10-CM

## 2013-08-14 DIAGNOSIS — I2510 Atherosclerotic heart disease of native coronary artery without angina pectoris: Secondary | ICD-10-CM

## 2013-08-14 DIAGNOSIS — F17200 Nicotine dependence, unspecified, uncomplicated: Secondary | ICD-10-CM

## 2013-08-14 NOTE — Assessment & Plan Note (Signed)
On statin therapy followed by her PCP 

## 2013-08-14 NOTE — Assessment & Plan Note (Signed)
Well-controlled on current medications 

## 2013-08-14 NOTE — Patient Instructions (Signed)
  We will see you back in follow up in 1 year with Dr Berry  Dr Berry has ordered: 1. lower extremity arterial doppler- During this test, ultrasound is used to evaluate arterial blood flow in the legs. Allow approximately one hour for this exam.      

## 2013-08-14 NOTE — Assessment & Plan Note (Signed)
Recalcitrant to have factor modification

## 2013-08-14 NOTE — Progress Notes (Signed)
08/14/2013 [name]   01/01/1941  [id number]  Primary Physician Allison Cranker, MD Primary Cardiologist: Allison Harp MD [name]   HPI:  The patient is a 73 year old mildly overweight married Caucasian female, mother of 96, grandmother of 2 grandchildren, whom I last saw in the office April 13, 2010. She has a history of CAD, status post RCA stenting in the past. She was catheterized by Dr. Janene Bradley May 04, 2007, after abnormal Myoview revealing 30% to 40% eccentric distal left main. A proximal RCA stent was patent and her EF was normal. Other problems include hypertension, hyperlipidemia, and diabetes. She has chronic left bundle branch block. She smokes 1-1/2 packs of cigarettes a day. She has had back surgery by Dr. Lissa Bradley. Myoview stress test performed March 24, 2010, revealed new anteroapical scar, and because of shortness of breath I catheterized her March 31, 2010, revealing 50% "in-stent restenosis" within the RCA stent, which was smooth, 40% distal left main with anatomy unchanged from the prior study and normal LV function. She really denies chest pain or shortness of breath. Her most recent lab work, performed by Dr. Buddy Bradley in June, revealed a total cholesterol of 138, LDL of 57, HDL of 44.  I saw her one year ago. Over the last several weeks she developed new onset chest pressure occurring every other day lasting for minutes at a time associated with diaphoresis. I'm concerned that she may have progression of her left main disease or and/or her RCA in-stent restenosis. Her Myoview performed prior to her last cath in January 2012 was remarkable for anteroapical scar versus breast attenuation artifact. Based on her anatomy and her symptoms I elected to proceed with outpatient diagnostic coronary arteriography which I performed on 12/26/12 revealing essentially unchanged anatomy. Her RCA stent was patent her left main was mild. She did have mild to moderate  left ventricular dysfunction with an EF in the 45% range. 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 She gets occasional chest pain which has not changed in frequency or severity. She does complain of back pain and leg pain and is seeing Dr. Ellene Bradley for this. She has decreased pedal pulses on exam raising the issue of the possibility of peripheral vascular disease.    Current Outpatient Prescriptions  Medication Sig Dispense Refill  . ACCU-CHEK SOFTCLIX LANCETS lancets       . AFLURIA PRESERVATIVE FREE injection       . ALPRAZolam (XANAX) 1 MG tablet Take 1 mg by mouth 4 (four) times daily as needed.       Marland Kitchen aspirin 81 MG tablet Take 81 mg by mouth daily.      . carvedilol (COREG) 12.5 MG tablet Take 1 tablet (12.5 mg total) by mouth 2 (two) times daily with a meal.  180 tablet  3  . citalopram (CELEXA) 40 MG tablet Take 40 mg by mouth daily.      . clopidogrel (PLAVIX) 75 MG tablet TAKE 1 TABLET EVERY DAY  90 tablet  2  . furosemide (LASIX) 40 MG tablet Take 40 mg by mouth 3 (three) times daily.      Marland Kitchen glipiZIDE (GLUCOTROL) 5 MG tablet Take 1 tablet by mouth daily.      Marland Kitchen HYDROcodone-acetaminophen (NORCO/VICODIN) 5-325 MG per tablet Take 1 tablet by mouth every 6 (six) hours as needed for moderate pain.      Marland Kitchen JANUVIA 50  MG tablet Take 1 tablet by mouth daily.      Marland Kitchen ketoconazole (NIZORAL) 2 % cream Apply 1 application topically daily as needed (to feet).      Marland Kitchen levothyroxine (SYNTHROID, LEVOTHROID) 112 MCG tablet Take 112 mcg by mouth daily before breakfast.      . nitroGLYCERIN (NITROSTAT) 0.4 MG SL tablet Place 1 tablet (0.4 mg total) under the tongue every 5 (five) minutes as needed for chest pain.  25 tablet  3  . nortriptyline (PAMELOR) 50 MG capsule Take 50 mg by mouth daily.      . pantoprazole (PROTONIX) 40 MG tablet       . potassium chloride (K-DUR) 10 MEQ tablet       . rOPINIRole  (REQUIP) 0.5 MG tablet Take 0.5 mg by mouth daily.      . simvastatin (ZOCOR) 20 MG tablet Take 20 mg by mouth every evening.       No current facility-administered medications for this visit.    Allergies  Allergen Reactions  . Valium [Diazepam] Swelling    face    History   Social History  . Marital Status: Married    Spouse Name: N/A    Number of Children: N/A  . Years of Education: N/A   Occupational History  . Not on file.   Social History Main Topics  . Smoking status: Current Every Day Smoker -- 1.00 packs/day for 58 years    Types: Cigarettes    Start date: 12/26/1952  . Smokeless tobacco: Never Used  . Alcohol Use: No  . Drug Use: No  . Sexual Activity: Not on file   Other Topics Concern  . Not on file   Social History Narrative  . No narrative on file     Review of Systems: General: negative for chills, fever, night sweats or weight changes.  Cardiovascular: negative for chest pain, dyspnea on exertion, edema, orthopnea, palpitations, paroxysmal nocturnal dyspnea or shortness of breath Dermatological: negative for rash Respiratory: negative for cough or wheezing Urologic: negative for hematuria Abdominal: negative for nausea, vomiting, diarrhea, bright red blood per rectum, melena, or hematemesis Neurologic: negative for visual changes, syncope, or dizziness All other systems reviewed and are otherwise negative except as noted above.    Blood pressure 122/60, pulse 93, height 5\' 6"  (1.676 m), weight 192 lb 6.4 oz (87.272 kg).  General appearance: alert and no distress Neck: no adenopathy, no carotid bruit, no JVD, supple, symmetrical, trachea midline and thyroid not enlarged, symmetric, no tenderness/mass/nodules Lungs: clear to auscultation bilaterally Heart: regular rate and rhythm, S1, S2 normal, no murmur, click, rub or gallop Extremities: diminished pedal pulses  EKG normal sinus rhythm at 93 with left bundle branch block  ASSESSMENT AND  PLAN:   Tobacco abuse Recalcitrant to have factor modification  CORONARY HEART DISEASE History of remote RCA stenting. Her last cath performed by myself 12/26/12 revealed a patent RCA stent with moderate in-stent restenosis and mild distal left main disease but do not appear to be obstructive. She gets occasional chest pain. Her EF was in the 45% range.  Hyperlipidemia On statin therapy followed by her PCP  HYPERTENSION Well-controlled on current medications      Allison Harp MD [facility], [facility] 08/14/2013 3:37 PM

## 2013-08-14 NOTE — Assessment & Plan Note (Signed)
History of remote RCA stenting. Her last cath performed by myself 12/26/12 revealed a patent RCA stent with moderate in-stent restenosis and mild distal left main disease but do not appear to be obstructive. She gets occasional chest pain. Her EF was in the 45% range.

## 2013-08-19 ENCOUNTER — Ambulatory Visit (HOSPITAL_COMMUNITY)
Admission: RE | Admit: 2013-08-19 | Discharge: 2013-08-19 | Disposition: A | Discharge status: Home / Self Care | Payer: Medicare FFS | Source: Ambulatory Visit | Attending: Cardiovascular Disease | Admitting: Cardiovascular Disease

## 2013-08-19 DIAGNOSIS — R0989 Other specified symptoms and signs involving the circulatory and respiratory systems: Secondary | ICD-10-CM

## 2013-08-19 DIAGNOSIS — I70219 Atherosclerosis of native arteries of extremities with intermittent claudication, unspecified extremity: Secondary | ICD-10-CM

## 2013-08-19 DIAGNOSIS — I739 Peripheral vascular disease, unspecified: Secondary | ICD-10-CM | POA: Insufficient documentation

## 2013-08-19 NOTE — Progress Notes (Signed)
Arterial Duplex Lower Ext. Completed. Razan Siler, BS, RDMS, RVT  

## 2013-08-27 ENCOUNTER — Encounter: Payer: Self-pay | Admitting: *Deleted

## 2013-08-28 ENCOUNTER — Telehealth: Payer: Self-pay | Admitting: *Deleted

## 2013-08-28 NOTE — Telephone Encounter (Signed)
Dr Watt Climes is requesting for patient to hold Plavix prior to the colonscopy.  Dr Gwenlyn Found reviewed the chart and gave permission for [name] to hold her plavix and proceed with the coloscopy.  Form signed by Dr Gwenlyn Found and faxed back to Dr Watt Climes.

## 2013-09-09 ENCOUNTER — Other Ambulatory Visit: Payer: Self-pay | Admitting: Gastroenterology

## 2013-09-23 ENCOUNTER — Telehealth: Payer: Self-pay | Admitting: *Deleted

## 2013-09-23 NOTE — Telephone Encounter (Signed)
Dr Gwenlyn Found reviewed the chart and authorized for patient to proceed with procedure and hold plavix prior.  Form signed and returned to [place] [facility]

## 2013-11-05 ENCOUNTER — Telehealth: Payer: Self-pay | Admitting: Cardiovascular Disease

## 2013-11-05 MED ORDER — CARVEDILOL 12.5 MG PO TABS: 12.5000 mg | ORAL_TABLET | Freq: Two times a day (BID) | ORAL | Status: DC

## 2013-11-05 NOTE — Telephone Encounter (Signed)
Pt would like for you to call in her Carvedilol 12.5 mg #30  to [facility] until her mail order comes in. Please call to [phone]

## 2013-11-05 NOTE — Telephone Encounter (Signed)
Rx was sent to pharmacy electronically. 

## 2014-01-28 ENCOUNTER — Telehealth (HOSPITAL_COMMUNITY): Payer: Self-pay | Admitting: *Deleted

## 2014-01-29 ENCOUNTER — Encounter (HOSPITAL_COMMUNITY): Payer: Self-pay | Admitting: *Deleted

## 2014-01-29 ENCOUNTER — Other Ambulatory Visit (HOSPITAL_COMMUNITY): Payer: Self-pay | Admitting: Cardiovascular Disease

## 2014-01-29 DIAGNOSIS — I739 Peripheral vascular disease, unspecified: Secondary | ICD-10-CM

## 2014-02-11 ENCOUNTER — Other Ambulatory Visit (HOSPITAL_COMMUNITY): Payer: Self-pay | Admitting: Cardiovascular Disease

## 2014-02-12 NOTE — Telephone Encounter (Signed)
Rx was sent to pharmacy electronically. 

## 2014-02-13 ENCOUNTER — Encounter (HOSPITAL_COMMUNITY): Payer: Self-pay | Admitting: Cardiovascular Disease

## 2014-03-11 ENCOUNTER — Encounter (HOSPITAL_COMMUNITY): Payer: Medicare FFS

## 2014-03-12 ENCOUNTER — Encounter (HOSPITAL_COMMUNITY): Payer: Medicare FFS

## 2014-03-14 ENCOUNTER — Ambulatory Visit (HOSPITAL_COMMUNITY)
Admission: RE | Admit: 2014-03-14 | Discharge: 2014-03-14 | Disposition: A | Discharge status: Home / Self Care | Payer: Medicare FFS | Source: Ambulatory Visit | Attending: Cardiovascular Disease | Admitting: Cardiovascular Disease

## 2014-03-14 DIAGNOSIS — I70219 Atherosclerosis of native arteries of extremities with intermittent claudication, unspecified extremity: Secondary | ICD-10-CM | POA: Insufficient documentation

## 2014-03-14 DIAGNOSIS — I739 Peripheral vascular disease, unspecified: Secondary | ICD-10-CM

## 2014-03-14 NOTE — Progress Notes (Signed)
Lower Extremity Arterial Duplex Completed. °Brianna L Mazza,RVT °

## 2014-03-20 ENCOUNTER — Other Ambulatory Visit: Payer: Self-pay | Admitting: Internal Medicine

## 2014-03-20 ENCOUNTER — Encounter: Payer: Self-pay | Admitting: *Deleted

## 2014-04-21 ENCOUNTER — Telehealth: Payer: Self-pay | Admitting: Cardiovascular Disease

## 2014-04-21 NOTE — Telephone Encounter (Signed)
Spoke to patient  She states she is short of breath with small movements  Complain of some dull discomfort. It has been occurring for some time. Offered an appointment  For tomorrow. Patient states she lives 50 miles away would like to come in on 04/25/2014.  RN informed next available will be 05/14/14 2 pm. Patient states she will take it but if there is waitlist please her on it.  RN informed patient may go to primary or urgent care on [address]  In [place]. She verbalized understanding.

## 2014-04-21 NOTE — Telephone Encounter (Signed)
Weakness in legs and sob.  Can't walk from one room to the other "without panting".

## 2014-04-24 ENCOUNTER — Other Ambulatory Visit: Payer: Self-pay | Admitting: Gastroenterology

## 2014-04-24 DIAGNOSIS — R1084 Generalized abdominal pain: Secondary | ICD-10-CM

## 2014-04-28 ENCOUNTER — Inpatient Hospital Stay
Admission: RE | Admit: 2014-04-28 | Discharge: 2014-04-28 | Disposition: A | Discharge status: Home / Self Care | Payer: Medicare FFS | Source: Ambulatory Visit | Attending: Gastroenterology | Admitting: Gastroenterology

## 2014-04-29 ENCOUNTER — Ambulatory Visit
Admission: RE | Admit: 2014-04-29 | Discharge: 2014-04-29 | Disposition: A | Discharge status: Home / Self Care | Payer: Medicare FFS | Source: Ambulatory Visit | Attending: Gastroenterology | Admitting: Gastroenterology

## 2014-04-29 ENCOUNTER — Other Ambulatory Visit (HOSPITAL_COMMUNITY): Payer: Self-pay | Admitting: Cardiovascular Disease

## 2014-04-29 DIAGNOSIS — R1084 Generalized abdominal pain: Secondary | ICD-10-CM

## 2014-04-30 NOTE — Telephone Encounter (Signed)
Rx(s) sent to pharmacy electronically.  

## 2014-05-12 ENCOUNTER — Other Ambulatory Visit: Payer: Self-pay | Admitting: Internal Medicine

## 2014-05-14 ENCOUNTER — Ambulatory Visit (INDEPENDENT_AMBULATORY_CARE_PROVIDER_SITE_OTHER): Payer: Medicare FFS | Admitting: Cardiology

## 2014-05-14 ENCOUNTER — Encounter: Payer: Self-pay | Admitting: Cardiology

## 2014-05-14 VITALS — BP 130/72 | HR 89 | Ht 66.0 in | Wt 199.2 lb

## 2014-05-14 DIAGNOSIS — R0600 Dyspnea, unspecified: Secondary | ICD-10-CM

## 2014-05-14 DIAGNOSIS — R079 Chest pain, unspecified: Secondary | ICD-10-CM

## 2014-05-14 MED ORDER — ISOSORBIDE MONONITRATE ER 30 MG PO TB24: 30.0000 mg | ORAL_TABLET | Freq: Every day | ORAL | Status: DC

## 2014-05-14 NOTE — Patient Instructions (Signed)
START Isosorbide 30mg  daily.  Your physician has requested that you have an echocardiogram. Echocardiography is a painless test that uses sound waves to create images of your heart. It provides your doctor with information about the size and shape of your heart and how well your heart's chambers and valves are working. This procedure takes approximately one hour. There are no restrictions for this procedure.  Your physician has requested that you have a lexiscan myoview. For further information please visit [email or web address]. Please follow instruction sheet, as given.  Your physician recommends that you schedule a follow-up appointment in: after testing with Cecilie Kicks, NP or Dr. Gwenlyn Found for results.

## 2014-05-14 NOTE — Progress Notes (Signed)
Cardiology Office Note   Date:  05/14/2014   ID:  [name], DOB 10/26/1940, MRN [id number]  PCP:  Allison Girt, DO  Cardiologist:  Dr. Adora Fridge     Chief Complaint  Patient presents with  . Shortness of Breath    Increasing SOB with activity over the past 5-6 weeks.  She does have intermittent chest pain that resolves quickly.[name]. mild edema.  [place]. dizziness better since decreasing furosemide.      History of Present Illness: [name] is a 74 y.o. female who presents for SOB.  She has a history of CAD, status post RCA stenting in the past. She was catheterized by Dr. Janene Madeira May 04, 2007, after abnormal Myoview revealing 30% to 40% eccentric distal left main. A proximal RCA stent was patent and her EF was normal. Other problems include hypertension, hyperlipidemia, and diabetes. She has chronic left bundle branch block. She has had back surgery by Dr. Lissa Merlin. Myoview stress test performed March 24, 2010, revealed new anteroapical scar, and because of shortness of breath Dr. Adora Fridge catheterized her March 31, 2010, revealing 50% "in-stent restenosis" within the RCA stent, which was smooth, 40% distal left main with anatomy unchanged from the prior study and normal LV function.  Her Myoview performed prior to her last cath in January 2012 was remarkable for anteroapical scar versus breast attenuation artifact. Based on her anatomy and her symptoms Dr. Adora Fridge elected to proceed with outpatient diagnostic coronary arteriography 12/26/12 revealing essentially unchanged anatomy. Her RCA stent was patent her left main was mild. She did have mild to moderate left ventricular dysfunction with an EF in the 45% range. 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    Today she called and made an appointment secondary to dyspnea on exertion. Started 5-6 weeks ago and has continued to increase.  She has no shortness of breath at rest or during  sleep she doesn't have any chest pain either. She does become diaphoretic with activity with the shortness of breath. Prior to her shortness of breath her Lasix was decreased from 3 tablets a day down to 40 mg daily 1 tablet secondary to orthostatic hypotension. She is very dizzy on standing and her primary noted her blood pressure was very low when she stood up so ordered Lasix is been decreased and she has no rales in her lungs today either. She does have a chronic left bundle branch block. She was smoking but for 8 months she's been using electronic cigarettes and only 3 mg of nicotine with this,  at one point she was up to 2.5 pks of cigarettes a day so she has much improved.  Her primary care has done recent lab work and chest x-ray I do not have those results.  Past Medical History  Diagnosis Date  . Syncope and collapse     CAROTID DOPPLER, 08/21/2009 - no significant stenosis demonstrated  . LBBB (left bundle branch block)     NUCLEAR STRESS TEST, 03/24/2010 - significant perfusion defect due to infarct/scar with mild perinfarct ischemia seen in Mid Anterior, Mid Anteroseptal, Apical Anterior, Apical Septal, Apical and Septal regions, post stress EF 63%  . Syncope     2D ECHO, 08/21/2009 - EF 45-50%, normal  . OSA (obstructive sleep apnea)     AHI-9.77/hr, during REM-50.32/hr  . Coronary artery disease   . Hypertension   . Hyperlipidemia   . Diabetes   . Tobacco  abuse     Past Surgical History  Procedure Laterality Date  . Cardiac catheterization  03/31/2010    Moderate-noncritical CAD  . Cardiac catheterization  05/04/2007    Recommended medical therapy  . Cardiac catheterization  12/14/2006    Recommended stenting of RCA  . Cardiac catheterization  12/14/2006    RCA stented with a 3.0 [employer] Liberte stent resulting in a reduction of 75% to 0% residual  . Cardiac catheterization  05/14/2004    Recommending medical therapy  . Cardiac catheterization  08/26/1999    No  intervention  . Left heart catheterization with coronary angiogram N/A 12/27/2012    Procedure: LEFT HEART CATHETERIZATION WITH CORONARY ANGIOGRAM;  Surgeon: Lorretta Harp, MD;  Location[facility] CATH LAB;  Service: Cardiovascular;  Laterality: N/A;     Current Outpatient Prescriptions  Medication Sig Dispense Refill  . ACCU-CHEK SOFTCLIX LANCETS lancets     . AFLURIA PRESERVATIVE FREE injection     . ALPRAZolam (XANAX) 1 MG tablet Take 1 mg by mouth 4 (four) times daily as needed.     Marland Kitchen aspirin 81 MG tablet Take 81 mg by mouth daily.    . carvedilol (COREG) 12.5 MG tablet Take 1 tablet (12.5 mg total) by mouth 2 (two) times daily with a meal. 180 tablet 1  . citalopram (CELEXA) 40 MG tablet Take 40 mg by mouth daily.    . clopidogrel (PLAVIX) 75 MG tablet Take 1 tablet (75 mg total) by mouth daily. 90 tablet 1  . furosemide (LASIX) 40 MG tablet Take 40 mg by mouth daily.     Marland Kitchen glipiZIDE (GLUCOTROL) 5 MG tablet Take 1 tablet by mouth daily.    Marland Kitchen HYDROcodone-acetaminophen (NORCO/VICODIN) 5-325 MG per tablet Take 1 tablet by mouth every 6 (six) hours as needed for moderate pain.    Marland Kitchen JANUVIA 50 MG tablet Take 1 tablet by mouth daily.    Marland Kitchen levothyroxine (SYNTHROID, LEVOTHROID) 112 MCG tablet Take 112 mcg by mouth daily before breakfast.    . nitroGLYCERIN (NITROSTAT) 0.4 MG SL tablet Place 1 tablet (0.4 mg total) under the tongue every 5 (five) minutes as needed for chest pain. 25 tablet 3  . nortriptyline (PAMELOR) 50 MG capsule Take 50 mg by mouth daily.    . pantoprazole (PROTONIX) 40 MG tablet     . potassium chloride (K-DUR) 10 MEQ tablet Take 10 mEq by mouth daily.     . simvastatin (ZOCOR) 20 MG tablet Take 20 mg by mouth every evening.    . isosorbide mononitrate (IMDUR) 30 MG 24 hr tablet Take 1 tablet (30 mg total) by mouth daily. 30 tablet 3   No current facility-administered medications for this visit.    Allergies:   Valium    Social History:  The patient  reports that she  has been smoking Cigarettes.  She started smoking about 61 years ago. She has a 58 pack-year smoking history. She has never used smokeless tobacco. She reports that she does not drink alcohol or use illicit drugs.   Family History:  The patient's family history includes Hypertension in her mother; Stroke in her mother.    ROS:  General:no colds or fevers, no weight changes Skin:no rashes or ulcers HEENT:no blurred vision, no congestion CV:see HPI PUL:see HPI GI:no diarrhea constipation or melena, no indigestion GU:no hematuria, no dysuria MS:no joint pain, no claudication Neuro:no syncope, no lightheadedness Endo:+ diabetes stable, no thyroid disease  Wt Readings from Last 3 Encounters:  05/14/14  199 lb 3.2 oz (90.357 kg)  08/14/13 192 lb 6.4 oz (87.272 kg)  05/01/13 201 lb (91.173 kg)     PHYSICAL EXAM: VS:  BP 130/72 mmHg  Pulse 89  Ht 5\' 6"  (1.676 m)  Wt 199 lb 3.2 oz (90.357 kg)  BMI 32.17 kg/m2 , BMI Body mass index is 32.17 kg/(m^2). General:Pleasant affect, NAD Skin:Warm and dry, brisk capillary refill HEENT:normocephalic, sclera clear, mucus membranes moist Neck:supple,  No adenopathy, no JVD, no bruits  Heart:S1S2 RRR without murmur, gallup, rub or click Lungs:clear without rales, rhonchi, or wheezes [id number], non tender, + BS, do not palpate liver spleen or masses Ext:no lower ext edema, 2+ pedal pulses, 2+ radial pulses Neuro:alert and oriented X 3, MAE, follows commands, + facial symmetry    EKG:  EKG is ordered today. The ekg ordered today demonstrates SR with LBBB and no acute changes.    Recent Labs: No results found for requested labs within last 365 days. PCP has done labs and chest x-ray   Lipid Panel    Component Value Date/Time   CHOL  08/21/2009 0100    141        ATP III CLASSIFICATION:  <200     mg/dL   Desirable  200-239  mg/dL   Borderline High  >=240    mg/dL   High          TRIG 127 08/21/2009 0100   HDL 48 08/21/2009 0100    CHOLHDL 2.9 08/21/2009 0100   VLDL 25 08/21/2009 0100   LDLCALC  08/21/2009 0100    68        Total Cholesterol/HDL:CHD Risk Coronary Heart Disease Risk Table                     Men   Women  1/2 Average Risk   3.4   3.3  Average Risk       5.0   4.4  2 X Average Risk   9.6   7.1  3 X Average Risk  23.4   11.0        Use the calculated Patient Ratio above and the CHD Risk Table to determine the patient's CHD Risk.        ATP III CLASSIFICATION (LDL):  <100     mg/dL   Optimal  100-129  mg/dL   Near or Above                    Optimal  130-159  mg/dL   Borderline  160-189  mg/dL   High  >190     mg/dL   Very High       Other studies Reviewed: Additional studies/ records that were reviewed today include: Previous notes.   ASSESSMENT AND PLAN:  1.  Dyspnea on exertion concerning for anginal equivalent and diabetic patient with known coronary artery disease. I am beginning Imdur 30 mg daily and we are ordering a Lexiscan Myoview. Has a chronic left bundle branch block. We will see her back in follow-up with either myself or Dr. Gwenlyn Found.  2. CORONARY HEART DISEASE History of remote RCA stenting. Her last cath 12/26/12 revealed a patent RCA stent with moderate in-stent restenosis and mild distal left main disease but do not appear to be obstructive in 2014.   3. Hyperlipidemia On statin therapy followed by her PCP.   4. Hypertension controlled  5. Tobacco abuse now using electronic cigarettes only 3 mg of nicotine has cut  way back from her 2-1/2 packs a day    Current medicines are reviewed with the patient today.  The patient Has no concerns regarding medicines.  The following changes have been made:  See above Labs/ tests ordered today include:see above  Disposition:   FU:  see above  Signed, Isaiah Serge, NP  05/14/2014 7:40 PM    [facility] [place], [place], [place], [place] Phone:  [phone]; Fax: [phone]

## 2014-05-15 ENCOUNTER — Telehealth (HOSPITAL_COMMUNITY): Payer: Self-pay

## 2014-05-15 NOTE — Telephone Encounter (Signed)
Encounter complete. 

## 2014-05-20 ENCOUNTER — Ambulatory Visit (HOSPITAL_COMMUNITY)
Admission: RE | Admit: 2014-05-20 | Discharge: 2014-05-20 | Disposition: A | Discharge status: Home / Self Care | Payer: Medicare FFS | Source: Ambulatory Visit | Attending: Cardiology | Admitting: Cardiology

## 2014-05-20 ENCOUNTER — Ambulatory Visit (HOSPITAL_BASED_OUTPATIENT_CLINIC_OR_DEPARTMENT_OTHER)
Admission: RE | Admit: 2014-05-20 | Discharge: 2014-05-20 | Disposition: A | Discharge status: Home / Self Care | Payer: Medicare FFS | Source: Ambulatory Visit | Attending: Cardiology | Admitting: Cardiology

## 2014-05-20 DIAGNOSIS — R0600 Dyspnea, unspecified: Secondary | ICD-10-CM | POA: Diagnosis not present

## 2014-05-20 DIAGNOSIS — I10 Essential (primary) hypertension: Secondary | ICD-10-CM | POA: Diagnosis not present

## 2014-05-20 DIAGNOSIS — E119 Type 2 diabetes mellitus without complications: Secondary | ICD-10-CM | POA: Diagnosis not present

## 2014-05-20 DIAGNOSIS — Z720 Tobacco use: Secondary | ICD-10-CM | POA: Diagnosis not present

## 2014-05-20 DIAGNOSIS — R079 Chest pain, unspecified: Secondary | ICD-10-CM

## 2014-05-20 MED ORDER — PERFLUTREN LIPID MICROSPHERE
1.0000 mL | INTRAVENOUS | Status: AC | PRN
  Administered 2014-05-20: 1.5 mL via INTRAVENOUS

## 2014-05-20 MED ORDER — TECHNETIUM TC 99M SESTAMIBI GENERIC - CARDIOLITE
31.2000 | Freq: Once | INTRAVENOUS | Status: AC | PRN
  Administered 2014-05-20: 31.2 via INTRAVENOUS

## 2014-05-20 MED ORDER — REGADENOSON 0.4 MG/5ML IV SOLN
0.4000 mg | Freq: Once | INTRAVENOUS | Status: AC
  Administered 2014-05-20: 0.4 mg via INTRAVENOUS

## 2014-05-20 MED ORDER — AMINOPHYLLINE 25 MG/ML IV SOLN
100.0000 mg | Freq: Once | INTRAVENOUS | Status: AC
  Administered 2014-05-20: 100 mg via INTRAVENOUS

## 2014-05-20 MED ORDER — TECHNETIUM TC 99M SESTAMIBI GENERIC - CARDIOLITE
10.7000 | Freq: Once | INTRAVENOUS | Status: AC | PRN
  Administered 2014-05-20: 11 via INTRAVENOUS

## 2014-05-20 NOTE — Procedures (Addendum)
[facility] [address] [place] [address] [id number]  Cardiology Nuclear Med Study  [name] is a 74 y.o. female     MRN : [id number]     DOB: Apr 30, 1940  Procedure Date: 05/20/2014  Nuclear Med Background Indication for Stress Test:  Follow up CAD History:  Asthma, COPD, Emphysema and CAD;STENT/PTCA X1 (RCA);Last NUC MPI on 03/24/2010-scar;EF=63% Cardiac Risk Factors: Family History - CAD, Hypertension, LBBB, Lipids, NIDDM, Obesity and Smoker  Symptoms:  Chest Pain, Dizziness, DOE, Fatigue and Light-Headedness   Nuclear Pre-Procedure Caffeine/Decaff Intake:  12:00am NPO After: 8:00am   IV Site: R Forearm  IV 0.9% NS with Angio Cath:  22g  Chest Size (in):  n/a IV Started by: Rolene Course, RN  Height: 5\' 6"  (1.676 m)  Cup Size: D  BMI:  Body mass index is 32.13 kg/(m^2). Weight:  199 lb (90.266 kg)   Tech Comments:  n/a    Nuclear Med Study 1 or 2 day study: 1 day  Stress Test Type:  Kenneth Provider:  Quay Burow, MD   Resting Radionuclide: Technetium 13m Sestamibi  Resting Radionuclide Dose: 10.7 mCi   Stress Radionuclide:  Technetium 66m Sestamibi  Stress Radionuclide Dose: 31.2 mCi           Stress Protocol Rest HR: 100 Stress HR: 109  Rest BP: 130/80 Stress BP: 146/76  Exercise Time (min): n/a METS: n/a          Dose of Adenosine (mg):  n/a Dose of Lexiscan: 0.4 mg  Dose of Atropine (mg): n/a Dose of Dobutamine: n/a mcg/kg/min (at max HR)  Stress Test Technologist: Mellody Memos, CCT Nuclear Technologist: Anthony Sar   Rest Procedure:  Myocardial perfusion imaging was performed at rest 45 minutes following the intravenous administration of Technetium 19m Sestamibi. Stress Procedure:  The patient received IV Lexiscan 0.4 mg over 15-seconds.  Technetium 84m Sestamibi injected IV at 30-seconds.  Patient experienced extreme shortness of breath and was administered  100 mg of Aminophylline IV. There were no significant changes with Lexiscan.  Quantitative spect images were obtained after a 45 minute delay.  Transient Ischemic Dilatation (Normal <1.22):  0.99 QGS EDV:  160 ml QGS ESV:  116 ml LV Ejection Fraction: 28%     Rest ECG: NSR-LBBB  Stress ECG: Uninteretable due to baseline LBBB  QPS Raw Data Images:  Acquisition technically good; LVE. Stress Images:  There is decreased uptake in the anterior wall, septum, apex and inferior wall. Rest Images:  There is decreased uptake in the anterior wall, septum, apex and inferior wall; inferior defect less prominent compared to the stress images. Subtraction (SDS):  These findings are consistent with prior infarct and mild peri-infarct ischemia in the inferior wall.  Impression Exercise Capacity:  Lexiscan with no exercise. BP Response:  Normal blood pressure response. Clinical Symptoms:  There is dyspnea. ECG Impression:  Baseline:  LBBB.  EKG uninterpretable due to LBBB at rest and stress. Comparison with Prior Nuclear Study: Compared to 03/24/10, LV function is worse and inferior ischemia is new.  Overall Impression:  High risk stress nuclear study with large, severe, fixed defects in the anterior wall, septum and apex (LBBB artifact vs prior infarct); there is also a large, severe, partially reversible inferior defect consistent with prior infarct and mild peri-infarct ischemia.  LV Wall Motion:  Severe global hypokinesis.   Kirk Ruths, MD  05/20/2014 5:29 PM

## 2014-05-20 NOTE — Progress Notes (Signed)
2D Echo Performed 05/20/2014    [name], RCS

## 2014-05-21 ENCOUNTER — Telehealth: Payer: Self-pay | Admitting: Cardiovascular Disease

## 2014-05-21 NOTE — Telephone Encounter (Signed)
Pt forgot to get her lab work yesterday. She wants to know if she can get her lab results from her doctor in [facility]? She can have them done at Primary doctor.

## 2014-05-21 NOTE — Telephone Encounter (Signed)
Reviewed chart. No labs ordered @ last OV. Pt given this information, told to call for anything else. Voiced understanding.

## 2014-05-24 NOTE — Progress Notes (Signed)
Needs return office visit with me

## 2014-05-27 ENCOUNTER — Encounter: Payer: Self-pay | Admitting: Cardiovascular Disease

## 2014-05-27 ENCOUNTER — Ambulatory Visit (INDEPENDENT_AMBULATORY_CARE_PROVIDER_SITE_OTHER): Payer: Medicare FFS | Admitting: Cardiovascular Disease

## 2014-05-27 VITALS — BP 110/70 | HR 100 | Ht 66.0 in | Wt 196.0 lb

## 2014-05-27 DIAGNOSIS — E785 Hyperlipidemia, unspecified: Secondary | ICD-10-CM

## 2014-05-27 DIAGNOSIS — D689 Coagulation defect, unspecified: Secondary | ICD-10-CM

## 2014-05-27 DIAGNOSIS — Z79899 Other long term (current) drug therapy: Secondary | ICD-10-CM

## 2014-05-27 DIAGNOSIS — I2510 Atherosclerotic heart disease of native coronary artery without angina pectoris: Secondary | ICD-10-CM

## 2014-05-27 DIAGNOSIS — I10 Essential (primary) hypertension: Secondary | ICD-10-CM

## 2014-05-27 NOTE — Assessment & Plan Note (Signed)
History of hypertension blood pressure measured at 110/70 with pulse of 100. She is on carvedilol. Continue current meds at current dosing

## 2014-05-27 NOTE — Progress Notes (Signed)
05/27/2014 [name]   1940/03/28  RD:[id number]  Primary Physician Nicola Girt, DO Primary Cardiologist: Lorretta Harp MD [name]   HPI:  The patient is a 74 year old mildly overweight married Caucasian female, mother of 59, grandmother of 2 grandchildren, whom I last saw in the office 08/14/13.Marland Kitchen She has a history of CAD, status post RCA stenting in the past. She was catheterized by Dr. Janene Madeira May 04, 2007, after abnormal Myoview revealing 30% to 40% eccentric distal left main. A proximal RCA stent was patent and her EF was normal. Other problems include hypertension, hyperlipidemia, and diabetes. She has chronic left bundle branch block. She smokes 1-1/2 packs of cigarettes a day. She has had back surgery by Dr. Lissa Merlin. Myoview stress test performed March 24, 2010, revealed new anteroapical scar, and because of shortness of breath I catheterized her March 31, 2010, revealing 50% "in-stent restenosis" within the RCA stent, which was smooth, 40% distal left main with anatomy unchanged from the prior study and normal LV function. She really denies chest pain or shortness of breath. Her most recent lab work, performed by Dr. Buddy Duty in June, revealed a total cholesterol of 138, LDL of 57, HDL of 44.  I saw her one year ago. Over the last several weeks she developed new onset chest pressure occurring every other day lasting for minutes at a time associated with diaphoresis. I'm concerned that she may have progression of her left main disease or and/or her RCA in-stent restenosis. Her Myoview performed prior to her last cath in January 2012 was remarkable for anteroapical scar versus breast attenuation artifact. Based on her anatomy and her symptoms I elected to proceed with outpatient diagnostic coronary arteriography which I performed on 12/26/12 revealing essentially unchanged anatomy. Her RCA stent was patent her left main was mild. She did have mild to moderate left  ventricular dysfunction with an EF in the 45% range. 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 She gets occasional chest pain which has not changed in frequency or severity. She does complain of back pain and leg pain and is seeing Dr. Ellene Route for this. She has decreased pedal pulses on exam raising the issue of the possibility of peripheral vascular disease. Lower extremity arterial Doppler studies performed in our office 03/14/14 revealed ABIs of 1 bilaterally high-frequency signal in the left external iliac artery although her symptoms are symmetric. She saw Molli Hazard in the office 05/14/14 complaining of dyspnea on exertion and diaphoresis. A 2-D echo performed 05/20/14 as well as a Myoview stress test showed significant worsening of LV function with an EF 20%, a dilated left ventricle and moderate MR with a Myoview that showed scar in the anterior wall apex and septum markedly different than prior functional studies.   Current Outpatient Prescriptions  Medication Sig Dispense Refill  . ACCU-CHEK SOFTCLIX LANCETS lancets     . AFLURIA PRESERVATIVE FREE injection     . ALPRAZolam (XANAX) 1 MG tablet Take 1 mg by mouth 4 (four) times daily as needed.     Marland Kitchen amoxicillin-clavulanate (AUGMENTIN) 875-125 MG per tablet Take 1 tablet by mouth 2 (two) times daily.     Marland Kitchen aspirin 81 MG tablet Take 81 mg by mouth daily.    . carvedilol (COREG) 12.5 MG tablet Take 1 tablet (12.5 mg total) by mouth 2 (two) times daily with a meal. 180 tablet 1  .  citalopram (CELEXA) 40 MG tablet Take 40 mg by mouth daily.    . clopidogrel (PLAVIX) 75 MG tablet Take 1 tablet (75 mg total) by mouth daily. 90 tablet 1  . furosemide (LASIX) 40 MG tablet Take 40 mg by mouth daily.     Marland Kitchen glipiZIDE (GLUCOTROL) 5 MG tablet Take 1 tablet by mouth daily.    Marland Kitchen HYDROcodone-acetaminophen (NORCO/VICODIN) 5-325 MG per tablet Take 1 tablet by mouth  every 6 (six) hours as needed for moderate pain.    Marland Kitchen ipratropium-albuterol (DUONEB) 0.5-2.5 (3) MG/3ML SOLN Hasn't started yet due to no nebulizer machine    . isosorbide mononitrate (IMDUR) 30 MG 24 hr tablet Take 1 tablet (30 mg total) by mouth daily. 30 tablet 3  . JANUVIA 50 MG tablet Take 1 tablet by mouth daily.    Marland Kitchen levothyroxine (SYNTHROID, LEVOTHROID) 112 MCG tablet Take 112 mcg by mouth daily before breakfast.    . methylPREDNIsolone (MEDROL DOSPACK) 4 MG tablet     . nitroGLYCERIN (NITROSTAT) 0.4 MG SL tablet Place 1 tablet (0.4 mg total) under the tongue every 5 (five) minutes as needed for chest pain. 25 tablet 3  . nortriptyline (PAMELOR) 50 MG capsule Take 50 mg by mouth daily.    . pantoprazole (PROTONIX) 40 MG tablet     . potassium chloride (K-DUR) 10 MEQ tablet Take 10 mEq by mouth daily.     . simvastatin (ZOCOR) 20 MG tablet Take 20 mg by mouth every evening.     No current facility-administered medications for this visit.    Allergies  Allergen Reactions  . Valium [Diazepam] Swelling    face    History   Social History  . Marital Status: Married    Spouse Name: N/A  . Number of Children: N/A  . Years of Education: N/A   Occupational History  . Not on file.   Social History Main Topics  . Smoking status: Current Every Day Smoker -- 1.00 packs/day for 58 years    Types: Cigarettes    Start date: 12/26/1952  . Smokeless tobacco: Never Used  . Alcohol Use: No  . Drug Use: No  . Sexual Activity: Not on file   Other Topics Concern  . Not on file   Social History Narrative     Review of Systems: General: negative for chills, fever, night sweats or weight changes.  Cardiovascular: negative for chest pain, dyspnea on exertion, edema, orthopnea, palpitations, paroxysmal nocturnal dyspnea or shortness of breath Dermatological: negative for rash Respiratory: negative for cough or wheezing Urologic: negative for hematuria Abdominal: negative for nausea,  vomiting, diarrhea, bright red blood per rectum, melena, or hematemesis Neurologic: negative for visual changes, syncope, or dizziness All other systems reviewed and are otherwise negative except as noted above.    Blood pressure 110/70, pulse 100, height 5\' 6"  (1.676 m), weight 196 lb (88.905 kg).  General appearance: alert and no distress Neck: no adenopathy, no carotid bruit, no JVD, supple, symmetrical, trachea midline and thyroid not enlarged, symmetric, no tenderness/mass/nodules Lungs: clear to auscultation bilaterally Heart: regular rate and rhythm, S1, S2 normal, no murmur, click, rub or gallop Extremities: extremities normal, atraumatic, no cyanosis or edema  EKG not performed today  ASSESSMENT AND PLAN:   Essential hypertension History of hypertension blood pressure measured at 110/70 with pulse of 100. She is on carvedilol. Continue current meds at current dosing   Hyperlipidemia History of hyperlipidemia on some statin 20 mg a day followed by her PCP  Coronary atherosclerosis History of coronary artery disease status post RCA stenting in the past. She's had multiple cardiac catheterizations most recent of which was performed by myself 12/24/12 revealing unchanged anatomy with 40% distal left main and a patent RCA stent. Her EF at that time was 45%. She recently saw Cecilie Kicks in the office complaining of dyspnea and diaphoresis. A 2-D echo revealed moderate LV dilatation, EF 20% and moderate MR. A Myoview stress test showed significant scar in the anterior wall apex and septum worse than on her prior studies. Based on this and her symptoms and then proceed with outpatient right and left heart catheterization to define her anatomy and physiology.       Lorretta Harp MD FACP,FACC,FAHA, [facility] 05/27/2014 2:57 PM

## 2014-05-27 NOTE — Assessment & Plan Note (Signed)
History of hyperlipidemia on some statin 20 mg a day followed by her PCP

## 2014-05-27 NOTE — Patient Instructions (Signed)
Your physician has requested that you have a cardiac catheterization (left and right heart cath this Thursday). Cardiac catheterization is used to diagnose and/or treat various heart conditions. Doctors may recommend this procedure for a number of different reasons. The most common reason is to evaluate chest pain. Chest pain can be a symptom of coronary artery disease (CAD), and cardiac catheterization can show whether plaque is narrowing or blocking your heart's arteries. This procedure is also used to evaluate the valves, as well as measure the blood flow and oxygen levels in different parts of your heart. For further information please visit [email or web address].   Following your catheterization, you will not be allowed to drive for 3 days.  No lifting, pushing, or pulling greater that 10 pounds is allowed for 1 week.  You will be required to have the following tests prior to the procedure:  1. Blood work-the blood work can be done no more than 7 days prior to the procedure.  It can be done at any [facility] lab.  There is one downstairs on the first floor of this building and one in the [place] ([address])

## 2014-05-27 NOTE — Assessment & Plan Note (Signed)
History of coronary artery disease status post RCA stenting in the past. She's had multiple cardiac catheterizations most recent of which was performed by myself 12/24/12 revealing unchanged anatomy with 40% distal left main and a patent RCA stent. Her EF at that time was 45%. She recently saw Cecilie Kicks in the office complaining of dyspnea and diaphoresis. A 2-D echo revealed moderate LV dilatation, EF 20% and moderate MR. A Myoview stress test showed significant scar in the anterior wall apex and septum worse than on her prior studies. Based on this and her symptoms and then proceed with outpatient right and left heart catheterization to define her anatomy and physiology.

## 2014-05-28 LAB — BASIC METABOLIC PANEL
BUN: 25 mg/dL — ABNORMAL HIGH (ref 6–23)
CO2: 28 mEq/L (ref 19–32)
Calcium: 9.6 mg/dL (ref 8.4–10.5)
Chloride: 99 mEq/L (ref 96–112)
Creat: 1.34 mg/dL — ABNORMAL HIGH (ref 0.50–1.10)
Glucose, Bld: 128 mg/dL — ABNORMAL HIGH (ref 70–99)
Potassium: 4.2 mEq/L (ref 3.5–5.3)
Sodium: 142 mEq/L (ref 135–145)

## 2014-05-28 LAB — CBC
HCT: 33.3 % — ABNORMAL LOW (ref 36.0–46.0)
Hemoglobin: 10.8 g/dL — ABNORMAL LOW (ref 12.0–15.0)
MCH: 29.3 pg (ref 26.0–34.0)
MCHC: 32.4 g/dL (ref 30.0–36.0)
MCV: 90.5 fL (ref 78.0–100.0)
MPV: 9.7 fL (ref 8.6–12.4)
Platelets: 357 10*3/uL (ref 150–400)
RBC: 3.68 MIL/uL — ABNORMAL LOW (ref 3.87–5.11)
RDW: 16.1 % — ABNORMAL HIGH (ref 11.5–15.5)
WBC: 12.8 10*3/uL — ABNORMAL HIGH (ref 4.0–10.5)

## 2014-05-28 LAB — TSH: TSH: 0.516 u[IU]/mL (ref 0.350–4.500)

## 2014-05-28 LAB — PROTIME-INR
INR: 1.07 (ref <1.50)
Prothrombin Time: 13.9 seconds (ref 11.6–15.2)

## 2014-05-28 LAB — APTT: aPTT: 28 seconds (ref 24–37)

## 2014-05-29 ENCOUNTER — Encounter (HOSPITAL_COMMUNITY): Payer: Self-pay | Admitting: Cardiovascular Disease

## 2014-05-29 ENCOUNTER — Ambulatory Visit (HOSPITAL_COMMUNITY)
Admission: RE | Admit: 2014-05-29 | Discharge: 2014-05-30 | Disposition: A | Discharge status: Home / Self Care | Payer: Medicare FFS | Source: Ambulatory Visit | Attending: Cardiovascular Disease | Admitting: Cardiovascular Disease

## 2014-05-29 ENCOUNTER — Encounter (HOSPITAL_COMMUNITY)
Admission: RE | Disposition: A | Discharge status: Home / Self Care | Payer: Self-pay | Source: Ambulatory Visit | Attending: Cardiovascular Disease

## 2014-05-29 DIAGNOSIS — I447 Left bundle-branch block, unspecified: Secondary | ICD-10-CM | POA: Diagnosis not present

## 2014-05-29 DIAGNOSIS — R0609 Other forms of dyspnea: Secondary | ICD-10-CM | POA: Diagnosis not present

## 2014-05-29 DIAGNOSIS — E663 Overweight: Secondary | ICD-10-CM | POA: Insufficient documentation

## 2014-05-29 DIAGNOSIS — F17210 Nicotine dependence, cigarettes, uncomplicated: Secondary | ICD-10-CM | POA: Insufficient documentation

## 2014-05-29 DIAGNOSIS — I340 Nonrheumatic mitral (valve) insufficiency: Secondary | ICD-10-CM | POA: Insufficient documentation

## 2014-05-29 DIAGNOSIS — Z79899 Other long term (current) drug therapy: Secondary | ICD-10-CM | POA: Insufficient documentation

## 2014-05-29 DIAGNOSIS — I10 Essential (primary) hypertension: Secondary | ICD-10-CM | POA: Diagnosis not present

## 2014-05-29 DIAGNOSIS — Z955 Presence of coronary angioplasty implant and graft: Secondary | ICD-10-CM | POA: Diagnosis not present

## 2014-05-29 DIAGNOSIS — I5022 Chronic systolic (congestive) heart failure: Secondary | ICD-10-CM | POA: Diagnosis not present

## 2014-05-29 DIAGNOSIS — Z6831 Body mass index (BMI) 31.0-31.9, adult: Secondary | ICD-10-CM | POA: Diagnosis not present

## 2014-05-29 DIAGNOSIS — E785 Hyperlipidemia, unspecified: Secondary | ICD-10-CM | POA: Diagnosis not present

## 2014-05-29 DIAGNOSIS — I519 Heart disease, unspecified: Secondary | ICD-10-CM | POA: Diagnosis not present

## 2014-05-29 DIAGNOSIS — R079 Chest pain, unspecified: Secondary | ICD-10-CM | POA: Diagnosis present

## 2014-05-29 DIAGNOSIS — Z7902 Long term (current) use of antithrombotics/antiplatelets: Secondary | ICD-10-CM | POA: Insufficient documentation

## 2014-05-29 DIAGNOSIS — I2510 Atherosclerotic heart disease of native coronary artery without angina pectoris: Secondary | ICD-10-CM | POA: Diagnosis not present

## 2014-05-29 DIAGNOSIS — I255 Ischemic cardiomyopathy: Secondary | ICD-10-CM | POA: Diagnosis not present

## 2014-05-29 DIAGNOSIS — R0600 Dyspnea, unspecified: Secondary | ICD-10-CM | POA: Insufficient documentation

## 2014-05-29 DIAGNOSIS — G4733 Obstructive sleep apnea (adult) (pediatric): Secondary | ICD-10-CM | POA: Diagnosis not present

## 2014-05-29 DIAGNOSIS — E119 Type 2 diabetes mellitus without complications: Secondary | ICD-10-CM | POA: Insufficient documentation

## 2014-05-29 DIAGNOSIS — I209 Angina pectoris, unspecified: Secondary | ICD-10-CM

## 2014-05-29 DIAGNOSIS — D689 Coagulation defect, unspecified: Secondary | ICD-10-CM

## 2014-05-29 HISTORY — PX: LEFT AND RIGHT HEART CATHETERIZATION WITH CORONARY ANGIOGRAM: SHX5449

## 2014-05-29 LAB — POCT I-STAT 3, VENOUS BLOOD GAS (G3P V)
Acid-Base Excess: 3 mmol/L — ABNORMAL HIGH (ref 0.0–2.0)
Bicarbonate: 28.2 mEq/L — ABNORMAL HIGH (ref 20.0–24.0)
O2 Saturation: 57 %
TCO2: 30 mmol/L (ref 0–100)
pCO2, Ven: 46.1 mmHg (ref 45.0–50.0)
pH, Ven: 7.395 — ABNORMAL HIGH (ref 7.250–7.300)
pO2, Ven: 30 mmHg (ref 30.0–45.0)

## 2014-05-29 LAB — POCT I-STAT 3, ART BLOOD GAS (G3+)
Acid-Base Excess: 2 mmol/L (ref 0.0–2.0)
Bicarbonate: 27 mEq/L — ABNORMAL HIGH (ref 20.0–24.0)
O2 Saturation: 96 %
TCO2: 28 mmol/L (ref 0–100)
pCO2 arterial: 44.1 mmHg (ref 35.0–45.0)
pH, Arterial: 7.395 (ref 7.350–7.450)
pO2, Arterial: 82 mmHg (ref 80.0–100.0)

## 2014-05-29 LAB — GLUCOSE, CAPILLARY
Glucose-Capillary: 142 mg/dL — ABNORMAL HIGH (ref 70–99)
Glucose-Capillary: 108 mg/dL — ABNORMAL HIGH (ref 70–99)
Glucose-Capillary: 119 mg/dL — ABNORMAL HIGH (ref 70–99)
Glucose-Capillary: 134 mg/dL — ABNORMAL HIGH (ref 70–99)

## 2014-05-29 SURGERY — LEFT AND RIGHT HEART CATHETERIZATION WITH CORONARY ANGIOGRAM

## 2014-05-29 MED ORDER — HEPARIN (PORCINE) IN NACL 2-0.9 UNIT/ML-% IJ SOLN
INTRAMUSCULAR | Status: AC
  Filled 2014-05-29: qty 1000

## 2014-05-29 MED ORDER — ALPRAZOLAM 0.25 MG PO TABS
ORAL_TABLET | ORAL | Status: AC
Start: 2014-05-29 — End: 2014-05-29
  Administered 2014-05-29: 1 mg
  Filled 2014-05-29: qty 4

## 2014-05-29 MED ORDER — FENTANYL CITRATE 0.05 MG/ML IJ SOLN
INTRAMUSCULAR | Status: AC
  Filled 2014-05-29: qty 2

## 2014-05-29 MED ORDER — POTASSIUM CHLORIDE ER 10 MEQ PO TBCR
10.0000 meq | EXTENDED_RELEASE_TABLET | Freq: Every day | ORAL | Status: DC
  Administered 2014-05-30: 10 meq via ORAL
  Filled 2014-05-29 (×2): qty 1

## 2014-05-29 MED ORDER — LEVOTHYROXINE SODIUM 112 MCG PO TABS
112.0000 ug | ORAL_TABLET | Freq: Every day | ORAL | Status: DC
  Filled 2014-05-29: qty 1

## 2014-05-29 MED ORDER — METHYLPREDNISOLONE 4 MG PO TABS: 4.0000 mg | ORAL_TABLET | Freq: Once | ORAL | Status: DC

## 2014-05-29 MED ORDER — ASPIRIN 81 MG PO CHEW: 81.0000 mg | CHEWABLE_TABLET | Freq: Every day | ORAL | Status: DC

## 2014-05-29 MED ORDER — CARVEDILOL 12.5 MG PO TABS
12.5000 mg | ORAL_TABLET | Freq: Two times a day (BID) | ORAL | Status: DC
  Administered 2014-05-29 – 2014-05-30 (×2): 12.5 mg via ORAL
  Filled 2014-05-29 (×2): qty 1

## 2014-05-29 MED ORDER — SODIUM CHLORIDE 0.9 % IV SOLN
INTRAVENOUS | Status: DC
  Administered 2014-05-29: 12:00:00 via INTRAVENOUS

## 2014-05-29 MED ORDER — ACCU-CHEK SOFTCLIX LANCETS MISC: Freq: Three times a day (TID) | Status: DC

## 2014-05-29 MED ORDER — CLOPIDOGREL BISULFATE 75 MG PO TABS
75.0000 mg | ORAL_TABLET | Freq: Every day | ORAL | Status: DC
  Administered 2014-05-30: 75 mg via ORAL
  Filled 2014-05-29: qty 1

## 2014-05-29 MED ORDER — ASPIRIN 81 MG PO CHEW
CHEWABLE_TABLET | ORAL | Status: AC
  Administered 2014-05-29: 81 mg via ORAL
  Filled 2014-05-29: qty 1

## 2014-05-29 MED ORDER — ALPRAZOLAM 0.5 MG PO TABS
1.0000 mg | ORAL_TABLET | Freq: Four times a day (QID) | ORAL | Status: DC | PRN
  Administered 2014-05-29: 1 mg via ORAL
  Filled 2014-05-29: qty 2

## 2014-05-29 MED ORDER — AMOXICILLIN-POT CLAVULANATE 875-125 MG PO TABS
1.0000 | ORAL_TABLET | Freq: Two times a day (BID) | ORAL | Status: DC
  Administered 2014-05-29: 1 via ORAL
  Filled 2014-05-29 (×2): qty 1

## 2014-05-29 MED ORDER — MIDAZOLAM HCL 2 MG/2ML IJ SOLN
INTRAMUSCULAR | Status: AC
  Filled 2014-05-29: qty 2

## 2014-05-29 MED ORDER — ONDANSETRON HCL 4 MG/2ML IJ SOLN: 4.0000 mg | Freq: Four times a day (QID) | INTRAMUSCULAR | Status: DC | PRN

## 2014-05-29 MED ORDER — LINAGLIPTIN 5 MG PO TABS
5.0000 mg | ORAL_TABLET | Freq: Every day | ORAL | Status: DC
  Administered 2014-05-30: 5 mg via ORAL
  Filled 2014-05-29: qty 1

## 2014-05-29 MED ORDER — METHYLPREDNISOLONE 4 MG PO KIT: 4.0000 mg | PACK | ORAL | Status: DC

## 2014-05-29 MED ORDER — MORPHINE SULFATE 2 MG/ML IJ SOLN
1.0000 mg | INTRAMUSCULAR | Status: DC | PRN
  Administered 2014-05-29: 1 mg via INTRAVENOUS
  Filled 2014-05-29: qty 1

## 2014-05-29 MED ORDER — NITROGLYCERIN 0.4 MG SL SUBL: 0.4000 mg | SUBLINGUAL_TABLET | SUBLINGUAL | Status: DC | PRN

## 2014-05-29 MED ORDER — YOU HAVE A PACEMAKER BOOK
Freq: Once | Status: AC
  Administered 2014-05-29: 19:00:00
  Filled 2014-05-29: qty 1

## 2014-05-29 MED ORDER — METHYLPREDNISOLONE 4 MG PO TABS
4.0000 mg | ORAL_TABLET | Freq: Every day | ORAL | Status: DC
  Administered 2014-05-29: 4 mg via ORAL
  Filled 2014-05-29 (×2): qty 1

## 2014-05-29 MED ORDER — SODIUM CHLORIDE 0.9 % IJ SOLN: 3.0000 mL | INTRAMUSCULAR | Status: DC | PRN

## 2014-05-29 MED ORDER — ISOSORBIDE MONONITRATE ER 30 MG PO TB24
30.0000 mg | ORAL_TABLET | Freq: Every day | ORAL | Status: DC
  Administered 2014-05-30: 30 mg via ORAL
  Filled 2014-05-29: qty 1

## 2014-05-29 MED ORDER — HYDROCODONE-ACETAMINOPHEN 5-325 MG PO TABS
1.0000 | ORAL_TABLET | Freq: Four times a day (QID) | ORAL | Status: DC | PRN
  Administered 2014-05-29: 1 via ORAL
  Filled 2014-05-29: qty 1

## 2014-05-29 MED ORDER — FUROSEMIDE 40 MG PO TABS
40.0000 mg | ORAL_TABLET | Freq: Every day | ORAL | Status: DC
Start: 2014-05-30 — End: 2014-05-30
  Administered 2014-05-30: 40 mg via ORAL
  Filled 2014-05-29: qty 1

## 2014-05-29 MED ORDER — SIMVASTATIN 20 MG PO TABS
20.0000 mg | ORAL_TABLET | Freq: Every evening | ORAL | Status: DC
  Administered 2014-05-29: 20 mg via ORAL
  Filled 2014-05-29: qty 1

## 2014-05-29 MED ORDER — METHYLPREDNISOLONE 4 MG PO TABS: 8.0000 mg | ORAL_TABLET | Freq: Once | ORAL | Status: DC

## 2014-05-29 MED ORDER — ASPIRIN 81 MG PO CHEW
81.0000 mg | CHEWABLE_TABLET | ORAL | Status: AC
  Administered 2014-05-29: 81 mg via ORAL

## 2014-05-29 MED ORDER — GLIPIZIDE 5 MG PO TABS
5.0000 mg | ORAL_TABLET | Freq: Two times a day (BID) | ORAL | Status: DC
  Filled 2014-05-29: qty 1

## 2014-05-29 MED ORDER — METHYLPREDNISOLONE 16 MG PO TABS
16.0000 mg | ORAL_TABLET | Freq: Once | ORAL | Status: DC
  Filled 2014-05-29: qty 1

## 2014-05-29 MED ORDER — PANTOPRAZOLE SODIUM 40 MG PO TBEC
40.0000 mg | DELAYED_RELEASE_TABLET | Freq: Every day | ORAL | Status: DC
  Filled 2014-05-29: qty 1

## 2014-05-29 MED ORDER — METHYLPREDNISOLONE 4 MG PO TABS: 12.0000 mg | ORAL_TABLET | Freq: Once | ORAL | Status: DC

## 2014-05-29 MED ORDER — CITALOPRAM HYDROBROMIDE 20 MG PO TABS
40.0000 mg | ORAL_TABLET | Freq: Every day | ORAL | Status: DC
  Administered 2014-05-29: 40 mg via ORAL
  Filled 2014-05-29 (×2): qty 2

## 2014-05-29 MED ORDER — SODIUM CHLORIDE 0.9 % IV SOLN
INTRAVENOUS | Status: AC
Start: 2014-05-29 — End: 2014-05-29
  Administered 2014-05-29: 18:00:00 via INTRAVENOUS

## 2014-05-29 MED ORDER — ACETAMINOPHEN 325 MG PO TABS: 650.0000 mg | ORAL_TABLET | ORAL | Status: DC | PRN

## 2014-05-29 MED ORDER — NORTRIPTYLINE HCL 25 MG PO CAPS
50.0000 mg | ORAL_CAPSULE | Freq: Every day | ORAL | Status: DC
  Administered 2014-05-29: 50 mg via ORAL
  Filled 2014-05-29: qty 2

## 2014-05-29 MED ORDER — NITROGLYCERIN 1 MG/10 ML FOR IR/CATH LAB
INTRA_ARTERIAL | Status: AC
  Filled 2014-05-29: qty 10

## 2014-05-29 MED ORDER — LIDOCAINE HCL (PF) 1 % IJ SOLN
INTRAMUSCULAR | Status: AC
  Filled 2014-05-29: qty 30

## 2014-05-29 MED ORDER — ASPIRIN EC 81 MG PO TBEC
81.0000 mg | DELAYED_RELEASE_TABLET | Freq: Every day | ORAL | Status: DC
  Filled 2014-05-29: qty 1

## 2014-05-29 NOTE — Interval H&P Note (Signed)
Cath Lab Visit (complete for each Cath Lab visit)  Clinical Evaluation Leading to the Procedure:   ACS: No.  Non-ACS:    Anginal Classification: CCS IV  Anti-ischemic medical therapy: Maximal Therapy (2 or more classes of medications)  Non-Invasive Test Results: High-risk stress test findings: cardiac mortality >3%/year  Prior CABG: No previous CABG      History and Physical Interval Note:  05/29/2014 2:35 PM  [name]  has presented today for surgery, with the diagnosis of cp  The various methods of treatment have been discussed with the patient and family. After consideration of risks, benefits and other options for treatment, the patient has consented to  Procedure(s): LEFT AND RIGHT HEART CATHETERIZATION WITH CORONARY ANGIOGRAM (N/A) as a surgical intervention .  The patient's history has been reviewed, patient examined, no change in status, stable for surgery.  I have reviewed the patient's chart and labs.  Questions were answered to the patient's satisfaction.     Lorretta Harp

## 2014-05-29 NOTE — Progress Notes (Signed)
Site area: RFA Site Prior to Removal:  Level 0 Pressure Applied For:18min Manual:  yes  Patient Status During Pull:  stable Post Pull Site:  Level 0 Post Pull Instructions Given:  yes Post Pull Pulses Present: palpable Dressing Applied:  clear Bedrest begins @  Comments:

## 2014-05-29 NOTE — H&P (View-Only) (Signed)
05/27/2014 [name]   19-Mar-1940  RD:[id number]  Primary Physician Nicola Girt, DO Primary Cardiologist: Lorretta Harp MD [name]   HPI:  The patient is a 74 year old mildly overweight married Caucasian female, mother of 41, grandmother of 2 grandchildren, whom I last saw in the office 08/14/13.Marland Kitchen She has a history of CAD, status post RCA stenting in the past. She was catheterized by Dr. Janene Madeira May 04, 2007, after abnormal Myoview revealing 30% to 40% eccentric distal left main. A proximal RCA stent was patent and her EF was normal. Other problems include hypertension, hyperlipidemia, and diabetes. She has chronic left bundle branch block. She smokes 1-1/2 packs of cigarettes a day. She has had back surgery by Dr. Lissa Merlin. Myoview stress test performed March 24, 2010, revealed new anteroapical scar, and because of shortness of breath I catheterized her March 31, 2010, revealing 50% "in-stent restenosis" within the RCA stent, which was smooth, 40% distal left main with anatomy unchanged from the prior study and normal LV function. She really denies chest pain or shortness of breath. Her most recent lab work, performed by Dr. Buddy Duty in June, revealed a total cholesterol of 138, LDL of 57, HDL of 44.  I saw her one year ago. Over the last several weeks she developed new onset chest pressure occurring every other day lasting for minutes at a time associated with diaphoresis. I'm concerned that she may have progression of her left main disease or and/or her RCA in-stent restenosis. Her Myoview performed prior to her last cath in January 2012 was remarkable for anteroapical scar versus breast attenuation artifact. Based on her anatomy and her symptoms I elected to proceed with outpatient diagnostic coronary arteriography which I performed on 12/26/12 revealing essentially unchanged anatomy. Her RCA stent was patent her left main was mild. She did have mild to moderate left  ventricular dysfunction with an EF in the 45% range. 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 She gets occasional chest pain which has not changed in frequency or severity. She does complain of back pain and leg pain and is seeing Dr. Ellene Route for this. She has decreased pedal pulses on exam raising the issue of the possibility of peripheral vascular disease. Lower extremity arterial Doppler studies performed in our office 03/14/14 revealed ABIs of 1 bilaterally high-frequency signal in the left external iliac artery although her symptoms are symmetric. She saw Molli Hazard in the office 05/14/14 complaining of dyspnea on exertion and diaphoresis. A 2-D echo performed 05/20/14 as well as a Myoview stress test showed significant worsening of LV function with an EF 20%, a dilated left ventricle and moderate MR with a Myoview that showed scar in the anterior wall apex and septum markedly different than prior functional studies.   Current Outpatient Prescriptions  Medication Sig Dispense Refill  . ACCU-CHEK SOFTCLIX LANCETS lancets     . AFLURIA PRESERVATIVE FREE injection     . ALPRAZolam (XANAX) 1 MG tablet Take 1 mg by mouth 4 (four) times daily as needed.     Marland Kitchen amoxicillin-clavulanate (AUGMENTIN) 875-125 MG per tablet Take 1 tablet by mouth 2 (two) times daily.     Marland Kitchen aspirin 81 MG tablet Take 81 mg by mouth daily.    . carvedilol (COREG) 12.5 MG tablet Take 1 tablet (12.5 mg total) by mouth 2 (two) times daily with a meal. 180 tablet 1  .  citalopram (CELEXA) 40 MG tablet Take 40 mg by mouth daily.    . clopidogrel (PLAVIX) 75 MG tablet Take 1 tablet (75 mg total) by mouth daily. 90 tablet 1  . furosemide (LASIX) 40 MG tablet Take 40 mg by mouth daily.     Marland Kitchen glipiZIDE (GLUCOTROL) 5 MG tablet Take 1 tablet by mouth daily.    Marland Kitchen HYDROcodone-acetaminophen (NORCO/VICODIN) 5-325 MG per tablet Take 1 tablet by mouth  every 6 (six) hours as needed for moderate pain.    Marland Kitchen ipratropium-albuterol (DUONEB) 0.5-2.5 (3) MG/3ML SOLN Hasn't started yet due to no nebulizer machine    . isosorbide mononitrate (IMDUR) 30 MG 24 hr tablet Take 1 tablet (30 mg total) by mouth daily. 30 tablet 3  . JANUVIA 50 MG tablet Take 1 tablet by mouth daily.    Marland Kitchen levothyroxine (SYNTHROID, LEVOTHROID) 112 MCG tablet Take 112 mcg by mouth daily before breakfast.    . methylPREDNIsolone (MEDROL DOSPACK) 4 MG tablet     . nitroGLYCERIN (NITROSTAT) 0.4 MG SL tablet Place 1 tablet (0.4 mg total) under the tongue every 5 (five) minutes as needed for chest pain. 25 tablet 3  . nortriptyline (PAMELOR) 50 MG capsule Take 50 mg by mouth daily.    . pantoprazole (PROTONIX) 40 MG tablet     . potassium chloride (K-DUR) 10 MEQ tablet Take 10 mEq by mouth daily.     . simvastatin (ZOCOR) 20 MG tablet Take 20 mg by mouth every evening.     No current facility-administered medications for this visit.    Allergies  Allergen Reactions  . Valium [Diazepam] Swelling    face    History   Social History  . Marital Status: Married    Spouse Name: N/A  . Number of Children: N/A  . Years of Education: N/A   Occupational History  . Not on file.   Social History Main Topics  . Smoking status: Current Every Day Smoker -- 1.00 packs/day for 58 years    Types: Cigarettes    Start date: 12/26/1952  . Smokeless tobacco: Never Used  . Alcohol Use: No  . Drug Use: No  . Sexual Activity: Not on file   Other Topics Concern  . Not on file   Social History Narrative     Review of Systems: General: negative for chills, fever, night sweats or weight changes.  Cardiovascular: negative for chest pain, dyspnea on exertion, edema, orthopnea, palpitations, paroxysmal nocturnal dyspnea or shortness of breath Dermatological: negative for rash Respiratory: negative for cough or wheezing Urologic: negative for hematuria Abdominal: negative for nausea,  vomiting, diarrhea, bright red blood per rectum, melena, or hematemesis Neurologic: negative for visual changes, syncope, or dizziness All other systems reviewed and are otherwise negative except as noted above.    Blood pressure 110/70, pulse 100, height 5\' 6"  (1.676 m), weight 196 lb (88.905 kg).  General appearance: alert and no distress Neck: no adenopathy, no carotid bruit, no JVD, supple, symmetrical, trachea midline and thyroid not enlarged, symmetric, no tenderness/mass/nodules Lungs: clear to auscultation bilaterally Heart: regular rate and rhythm, S1, S2 normal, no murmur, click, rub or gallop Extremities: extremities normal, atraumatic, no cyanosis or edema  EKG not performed today  ASSESSMENT AND PLAN:   Essential hypertension History of hypertension blood pressure measured at 110/70 with pulse of 100. She is on carvedilol. Continue current meds at current dosing   Hyperlipidemia History of hyperlipidemia on some statin 20 mg a day followed by her PCP  Coronary atherosclerosis History of coronary artery disease status post RCA stenting in the past. She's had multiple cardiac catheterizations most recent of which was performed by myself 12/24/12 revealing unchanged anatomy with 40% distal left main and a patent RCA stent. Her EF at that time was 45%. She recently saw Cecilie Kicks in the office complaining of dyspnea and diaphoresis. A 2-D echo revealed moderate LV dilatation, EF 20% and moderate MR. A Myoview stress test showed significant scar in the anterior wall apex and septum worse than on her prior studies. Based on this and her symptoms and then proceed with outpatient right and left heart catheterization to define her anatomy and physiology.       Lorretta Harp MD FACP,FACC,FAHA, [facility] 05/27/2014 2:57 PM

## 2014-05-29 NOTE — Progress Notes (Signed)
UR Completed Lawrence Mitch Graves-Bigelow, RN,BSN [phone]  

## 2014-05-29 NOTE — Consult Note (Signed)
ELECTROPHYSIOLOGY CONSULT NOTE    Patient ID: [name] MRN: [id number], DOB/AGE: August 25, 1940 74 y.o.  Admit date: 05/29/2014 Date of Consult: 05/30/2014  Primary Physician: Nicola Girt, DO Primary Cardiologist: Gwenlyn Found  Reason for Consultation: evaluate for device therapy  HPI:  [name] is a 74 y.o. female with a past medical history significant for CAD, ischemic cardiomyopathy, chronic systolic heart failure, LBBB, sleep apnea, hypertension, hyperlipidemia, and diabetes.  She has been followed by Dr Gwenlyn Found closely.  She has a longstanding cardiomyopathy dating back to 2011 and has recently had increasing dyspnea on exertion and diaphoresis.  Echocardiogram demonstrated intercurrent worsening of her EF prompting re-evaluation of coronary anatomy with cath yesterday.  Catheterization demonstrated unchanged coronary anatomy, EF 20-25% with no targets for revascularization.  EP has been asked to evaluate for treatment options.   She has a remote history of syncope which occurred when she was reaching for paper towels. Her loss of consciousness was only seconds we think.  She more recently has had problems with orthostatic intolerance with recurring presyncope with standing.  Her diuretics were decreased and she has had no recurrence.  She currently does not have significant orthostatic intolerance. However, the decrease in her diuretics coincides with some worsening of her shortness of breath.  She currently has dyspnea with exertion, but denies chest pain, orthopnea, PND.  She has had palpitations that are abrupt in onset and offset but not associated with dizziness or pre-syncope.    She has been on Carvedilol, Imdur, and Lasix.  She has not been on ACE-I or ARB as has renal insufficiency. In this regard notably she also has diabetes.  Past Medical History  Diagnosis Date  . Syncope and collapse     CAROTID DOPPLER, 08/21/2009 - no significant stenosis demonstrated  . LBBB (left  bundle branch block)     NUCLEAR STRESS TEST, 03/24/2010 - significant perfusion defect due to infarct/scar with mild perinfarct ischemia seen in Mid Anterior, Mid Anteroseptal, Apical Anterior, Apical Septal, Apical and Septal regions, post stress EF 63%  . Syncope     2D ECHO, 08/21/2009 - EF 45-50%, normal  . OSA (obstructive sleep apnea)     AHI-9.77/hr, during REM-50.32/hr  . Coronary artery disease   . Hypertension   . Hyperlipidemia   . Diabetes   . Tobacco abuse   . Left ventricular dysfunction     EF 20% with a dilated left ventricle and moderate MR     Surgical History:  Past Surgical History  Procedure Laterality Date  . Cardiac catheterization  03/31/2010    Moderate-noncritical CAD  . Cardiac catheterization  05/04/2007    Recommended medical therapy  . Cardiac catheterization  12/14/2006    Recommended stenting of RCA  . Cardiac catheterization  12/14/2006    RCA stented with a 3.0 [employer] Liberte stent resulting in a reduction of 75% to 0% residual  . Cardiac catheterization  05/14/2004    Recommending medical therapy  . Cardiac catheterization  08/26/1999    No intervention  . Left heart catheterization with coronary angiogram N/A 12/27/2012    Procedure: LEFT HEART CATHETERIZATION WITH CORONARY ANGIOGRAM;  Surgeon: Lorretta Harp, MD;  Location: [facility] CATH LAB;  Service: Cardiovascular;  Laterality: N/A;  . Left and right heart catheterization with coronary angiogram N/A 05/29/2014    Procedure: LEFT AND RIGHT HEART CATHETERIZATION WITH CORONARY ANGIOGRAM;  Surgeon: Lorretta Harp, MD;  Location: [facility] CATH LAB;  Service: Cardiovascular;  Laterality:  N/A;     Prescriptions prior to admission  Medication Sig Dispense Refill Last Dose  . ACCU-CHEK SOFTCLIX LANCETS lancets    05/28/2014 at Unknown time  . AFLURIA PRESERVATIVE FREE injection    05/28/2014 at Unknown time  . ALPRAZolam (XANAX) 1 MG tablet Take 1 mg by mouth 4 (four) times daily as needed for anxiety.     05/29/2014 at 0630  . amoxicillin-clavulanate (AUGMENTIN) 875-125 MG per tablet Take 1 tablet by mouth 2 (two) times daily.    05/28/2014 at 1900  . aspirin 81 MG tablet Take 81 mg by mouth daily.   05/28/2014 at 2200  . carvedilol (COREG) 12.5 MG tablet Take 1 tablet (12.5 mg total) by mouth 2 (two) times daily with a meal. 180 tablet 1 05/29/2014 at 0630  . citalopram (CELEXA) 40 MG tablet Take 40 mg by mouth daily.   05/28/2014 at 2300  . clopidogrel (PLAVIX) 75 MG tablet Take 1 tablet (75 mg total) by mouth daily. 90 tablet 1 05/29/2014 at 0630  . furosemide (LASIX) 40 MG tablet Take 40 mg by mouth daily.    05/29/2014 at 0630  . glipiZIDE (GLUCOTROL) 5 MG tablet Take 1 tablet by mouth 2 (two) times daily before a meal.    05/28/2014 at 2300  . HYDROcodone-acetaminophen (NORCO/VICODIN) 5-325 MG per tablet Take 1 tablet by mouth every 6 (six) hours as needed for moderate pain.   Past Week at Unknown time  . isosorbide mononitrate (IMDUR) 30 MG 24 hr tablet Take 1 tablet (30 mg total) by mouth daily. 30 tablet 3 05/29/2014 at 0630  . JANUVIA 50 MG tablet Take 1 tablet by mouth daily.   05/28/2014 at 1900  . levothyroxine (SYNTHROID, LEVOTHROID) 112 MCG tablet Take 112 mcg by mouth daily before breakfast.   05/28/2014 at 2300  . methylPREDNISolone (MEDROL DOSEPAK) 4 MG tablet Take 4 mg by mouth See admin instructions. follow package directions. Started medication on 05-27-14   05/28/2014 at 2300  . nortriptyline (PAMELOR) 50 MG capsule Take 50 mg by mouth daily.   05/28/2014 at 2230  . pantoprazole (PROTONIX) 40 MG tablet    05/28/2014 at 1900  . potassium chloride (K-DUR) 10 MEQ tablet Take 10 mEq by mouth daily.    05/28/2014 at 0630  . simvastatin (ZOCOR) 20 MG tablet Take 20 mg by mouth every evening.   05/28/2014 at 2300  . ipratropium-albuterol (DUONEB) 0.5-2.5 (3) MG/3ML SOLN Take 3 mLs by nebulization every 4 (four) hours as needed. Hasn't started yet due to no nebulizer machine. For shortness of breath    Unknown at Unknown time  . nitroGLYCERIN (NITROSTAT) 0.4 MG SL tablet Place 1 tablet (0.4 mg total) under the tongue every 5 (five) minutes as needed for chest pain. 25 tablet 3 Unknown at Unknown time    Inpatient Medications:  . amoxicillin-clavulanate  1 tablet Oral BID  . aspirin EC  81 mg Oral Daily  . carvedilol  12.5 mg Oral BID WC  . citalopram  40 mg Oral Daily  . clopidogrel  75 mg Oral Daily  . furosemide  40 mg Oral Daily  . glipiZIDE  5 mg Oral BID AC  . isosorbide mononitrate  30 mg Oral Daily  . levothyroxine  112 mcg Oral QAC breakfast  . linagliptin  5 mg Oral Daily  . methylPREDNISolone  4 mg Oral Daily  . nortriptyline  50 mg Oral QHS  . pantoprazole  40 mg Oral Daily  . potassium chloride  10 mEq Oral Daily  . simvastatin  20 mg Oral QPM    Allergies:  Allergies  Allergen Reactions  . Valium [Diazepam] Swelling    face    History   Social History  . Marital Status: Married    Spouse Name: N/A  . Number of Children: N/A  . Years of Education: N/A   Occupational History  . Not on file.   Social History Main Topics  . Smoking status: Current Every Day Smoker -- 1.00 packs/day for 58 years    Types: Cigarettes    Start date: 12/26/1952  . Smokeless tobacco: Never Used  . Alcohol Use: No  . Drug Use: No  . Sexual Activity: Not on file   Other Topics Concern  . Not on file   Social History Narrative     Family History  Problem Relation Age of Onset  . Stroke Mother   . Hypertension Mother      Review of Systems: General: No chills, fever, night sweats or weight changes  Cardiovascular:  No chest pain, + dyspnea on exertion, no edema, orthopnea, palpitations, paroxysmal nocturnal dyspnea Dermatological: No rash, lesions or masses Respiratory: No cough, dyspnea Urologic: No hematuria, dysuria Abdominal: No nausea, vomiting, diarrhea, bright red blood per rectum, melena, or hematemesis Neurologic: No visual changes, weakness, changes in  mental status All other systems reviewed and are otherwise negative except as noted above.  Physical Exam: Filed Vitals:   05/29/14 2035 05/29/14 2103 05/29/14 2202 05/30/14 0500  BP: 111/59 104/59 122/66 131/64  Pulse: 81   82  Temp: 97.6 F (36.4 C)   97.9 F (36.6 C)  TempSrc: Oral   Oral  Resp: 18     Height:    5\' 6"  (1.676 m)  Weight:    193 lb (87.544 kg)  SpO2: 96%   96%    GEN- The patient is well appearing, alert and oriented x 3 today.   HEENT: normocephalic, atraumatic; sclera clear, conjunctiva pink; hearing intact; oropharynx clear; neck supple, no JVP Lymph- no cervical lymphadenopathy Lungs- Clear to ausculation bilaterally, normal work of breathing.  No wheezes, rales, rhonchi Heart- Regular rate and rhythm, no murmurs, rubs or gallops, PMI not laterally displaced GI- soft, non-tender, non-distended, bowel sounds present, no hepatosplenomegaly Extremities- no clubbing, cyanosis, or edema; DP/PT/radial pulses 2+ bilaterally MS- no significant deformity or atrophy Skin- warm and dry, no rash or lesion Psych- euthymic mood, full affect Neuro- strength and sensation are intact  Labs:   Lab Results  Component Value Date   WBC 12.8* 05/27/2014   HGB 10.8* 05/27/2014   HCT 33.3* 05/27/2014   MCV 90.5 05/27/2014   PLT 357 05/27/2014     Recent Labs Lab 05/27/14 1524  NA 142  K 4.2  CL 99  CO2 28  BUN 25*  CREATININE 1.34*  CALCIUM 9.6  GLUCOSE 128*    EKG: sinus rhythm, rate 89, LBBB QRS 162msec  TELEMETRY: sinus rhythm  Assessment/Plan: 1.  Ischemic cardiomyopathy/chronic systolic heart failure/LBBB The patient has a longstanding cardiomyopathy with no targets for revascularization.  She has been on beta blocker, diuretic, and nitrates.  She has not been on an ACE-I or ARB in the setting of renal insufficiency.  If it is not felt that she is a candidate for ACE or ARB, she meets criteria for ICD implantation.  With LBBB and QRS of 156msec, it  would be expected that she would benefit from CRT    2.  CAD  Catheterization yesterday demonstrated stable coronary anatomy Continue BB, Imdur, Plavix, ASA  3.  HTN Stable on current regimen Blood pressure does not allow for up-titration/addition of other agents at this time  4.  Remote syncope This episode is quite worrisome given its brevity. In the context of left bundle branch block and her cardiomyopathy could represent either heart block and/or ventricular tachycardia and as such, I think proceeding with device implantation sooner rather than later may make sense. I will discuss this with Dr. Hiram Comber.  5. Orthostatic Intolerance this is potentially problematic. The decrease of her diuretics: Coincided with worsening of her dyspnea. Hopefully with CRT we can improve cardiac performance and minimize the need for diuretics. There may be value for support stockings. I'll be a little bit reluctant to try an abdominal binder as I would worry about the degree of RV overfilling. We could do this with some trepidation.  6.  Tachypalpitations  in the context of her cardiomyopathy this is worrisome for possible ventricular arrhythmias  Signed, Chanetta Marshall, NP 05/30/2014 6:56 AM  There were 2 separate issues related to devices. The first is primary prevention given her cardio myopathy. Normally we would anticipate waiting to see what happens with the ACE inhibitor.  However, with her syncope and bundle branch block, it is appropriate to proceed with pacing empirically based on guidelines is a two-way indication  and/or EP testing to identify potential ventricular arrhythmias.  According to AUC it would be appropriate to proceed with device implantation addressing the pacing issue as per guidelines and including a defibrillator.Have reviewed the potential benefits and risks of ICD implantation including but not limited to death, perforation of heart or lung, lead dislodgement, infection,  device  malfunction and inappropriate shocks.  The patient and family express understanding  and are willing to proceed.

## 2014-05-29 NOTE — CV Procedure (Signed)
[name] is a 74 y.o. female    [id number] LOCATION:  FACILITY: [facility]  PHYSICIAN: Quay Burow, M.D. [birth year]   DATE OF PROCEDURE:  05/29/2014  DATE OF DISCHARGE:     CARDIAC CATHETERIZATION     History obtained from chart review.The patient is a 74 year old mildly overweight married Caucasian female, mother of 15, grandmother of 2 grandchildren, whom I last saw in the office 08/14/13.Marland Kitchen She has a history of CAD, status post RCA stenting in the past. She was catheterized by Dr. Janene Madeira May 04, 2007, after abnormal Myoview revealing 30% to 40% eccentric distal left main. A proximal RCA stent was patent and her EF was normal. Other problems include hypertension, hyperlipidemia, and diabetes. She has chronic left bundle branch block. She smokes 1-1/2 packs of cigarettes a day. She has had back surgery by Dr. Lissa Merlin. Myoview stress test performed March 24, 2010, revealed new anteroapical scar, and because of shortness of breath I catheterized her March 31, 2010, revealing 50% "in-stent restenosis" within the RCA stent, which was smooth, 40% distal left main with anatomy unchanged from the prior study and normal LV function.I performed  Rep[eat cath on 12/26/12 revealing essentially unchanged anatomy from her prior cath. Her RCA stent was patent and  her left main was 40 %. She did have mild to moderate left ventricular dysfunction with an EF in the 45% range. 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 She gets occasional chest pain which has not changed in frequency or severity. She does complain of back pain and leg pain and is seeing Dr. Ellene Route for this. She saw Molli Hazard in the office 05/14/14 complaining of dyspnea on exertion and diaphoresis. A 2-D echo performed 05/20/14 as well as a Myoview stress test showed significant worsening of LV function with an EF 20%, a dilated left ventricle  and moderate MR with a Myoview that showed scar in the anterior wall apex and septum markedly different than prior functional studies.she presents today for right and left heart cath to define her anatomy and physiology.   PROCEDURE DESCRIPTION:   The patient was brought to the second floor [facility] cath lab in the postabsorptive state. She was premedicated with IV Versed and fentanyl. Her right groinwas prepped and shaved in usual sterile fashion. Xylocaine 1% was used for local anesthesia. A 5 French sheath was inserted into the right common femoral artery using standard Seldinger technique. A 7 French sheath was inserted into the right, common femoral vein. A 7 French balloon tip thermal dilution Swan-Ganz catheter was then used to obtain sequential right heart pressures as well as Fick and thermodilution cardiac outputs.Visipaque dye was used for the entirety of the case. 5 French right and left Judkins diagnostic catheter as well as the 5 French pigtail catheter were used for selective coronary angiography and left ventriculography respectively. Retrograde aortic, left ventricular and pullback pressures were recorded.   HEMODYNAMICS:    AO SYSTOLIC/AO DIASTOLIC: [id number]   LV SYSTOLIC/LV DIASTOLIC: 123XX123  Right atrial pressure: 6/4  Right ventricle pressure: Systolic 23 diastolic pressure 6  Pulmonary artery pressure: Systolic 27, diastolic 11, mean 18   Pulmonary Wedge pressure: A wave 10 V-wave 9 mean 9  Cardiac output  by thermodilution was 3.46 L/m with an index of 1.76 L/m/m, cardiac output by Fick was 4.57 L/m with an index of 2.32 L/m/m  ANGIOGRAPHIC RESULTS:   1. Left  main; 40% smooth distal  2. LAD;  Free of significant disease 3. Left circumflex; nondominant and free of significant disease.  4. Right coronary artery; dominant with a patent stent and a most 40% in-stent restenosis 5. Left ventriculography; RAO left ventriculogram was performed using  25 mL of  Visipaque dye at 12 mL/second. The overall LVEF estimated  20-25 %  With wall motion abnormalities an severe global hypokinesia  IMPRESSION:[name] has unchanged coronary anatomy with 40% distal left main and a patent RCA stent with severe LV dysfunction and fairly normal filling pressures with diminished cardiac output. She has a left bundle branch block with a QRS duration of 150 ms. She may benefit from a BiV- ICD for resynchronization  Therapy. I will refer her to the EP service for evaluation and consideration of this. The sheaths were removed and pressure held on the groin to achieve hemostasis. The patient left the lab in stable condition. She will remain recumbent for 6 hours and then will be discharged home. Outpatient EP follow-up will be arranged and she will see me back in the office in 2-3 weeks.  Lorretta Harp MD, [facility] 05/29/2014 3:42 PM

## 2014-05-30 ENCOUNTER — Encounter (HOSPITAL_COMMUNITY): Payer: Self-pay | Admitting: *Deleted

## 2014-05-30 DIAGNOSIS — R55 Syncope and collapse: Secondary | ICD-10-CM

## 2014-05-30 DIAGNOSIS — I2510 Atherosclerotic heart disease of native coronary artery without angina pectoris: Secondary | ICD-10-CM | POA: Diagnosis not present

## 2014-05-30 DIAGNOSIS — R0609 Other forms of dyspnea: Secondary | ICD-10-CM | POA: Diagnosis not present

## 2014-05-30 LAB — GLUCOSE, CAPILLARY
Glucose-Capillary: 133 mg/dL — ABNORMAL HIGH (ref 70–99)
Glucose-Capillary: 150 mg/dL — ABNORMAL HIGH (ref 70–99)

## 2014-05-30 MED ORDER — LISINOPRIL 2.5 MG PO TABS
2.5000 mg | ORAL_TABLET | Freq: Every day | ORAL | Status: DC
  Administered 2014-05-30: 2.5 mg via ORAL
  Filled 2014-05-30: qty 1

## 2014-05-30 MED ORDER — LISINOPRIL 2.5 MG PO TABS: 2.5000 mg | ORAL_TABLET | Freq: Every day | ORAL | Status: DC

## 2014-05-30 NOTE — Discharge Summary (Signed)
Physician Discharge Summary  Patient ID: [name] MRN: [id number] DOB/AGE: [birth year] 73 y.o.   Primary Cardiologist:  Dr. Gwenlyn Found  Admit date: 05/29/2014 Discharge date: 05/30/2014  Admission Diagnoses: Abnormal NST and Severely Reduced LV Systolic Dysfunction    Discharge Diagnoses:  Active Problems:   Chest pain   Atherosclerosis of native coronary artery of native heart without angina pectoris   Left ventricular dysfunction   Dyspnea on exertion   CAD (coronary artery disease)   Discharged Condition: stable  Hospital Course: The patient is a 74 y/o female followed by Dr. Gwenlyn Found, with a history of CAD, status post RCA stenting in the past. She was catheterized by Dr. Janene Madeira May 04, 2007, after abnormal Myoview revealing 30% to 40% eccentric distal left main. A proximal RCA stent was patent and her EF was normal. She also has a h/o syncope in 2011. Other problems include hypertension, hyperlipidemia and diabetes. She has chronic left bundle branch block. She smokes 1-1/2 packs of cigarettes a day. 2D echo in 2011 demonstrated mildly reduced systolic function with EF of 45-50%.  She was evaluated recently in clinic by Cecilie Kicks, NP, for evaluation for dyspnea. 2D echo was ordered which revealed new reduction in systolic function compared to prior study. EF was severely reduced to 20-25%. She also underwent a NST which was interpreted as a high risk stress nuclear study with large, severe, fixed defects in the anterior wall, septum and apex (LBBB artifact vs prior infarct); there was also a large, severe, partially reversible inferior defect consistent with prior infarct and mild peri-infarct ischemia. Subsequently, she was referred for diagnostic left hear catheterization.  She presented to [facility] on 05/29/14 for the planned procedure. It was performed by Dr. Gwenlyn Found. Access was obtained via the right femoral artery. She was found to have unchanged coronary anatomy with 40% distal  left main and a patent RCA stent with severe LV dysfunction and fairly normal filling pressures with diminished cardiac output. Continued medical therapy was recommended. She was placed on BB and ACE therapy. She left the cath lab in stable condition and had no post cath complications. Her right femoral access site remained stable.   Given her severe LV dysfunction, LBBB with a QRS duration of 150 ms and h/o prior unexplained syncope, EP was consulted to evaluate for possible ICD implantation. She was evaluated by Dr. Caryl Comes who agreed that she met criteria for ICD implantation for prevention of SCD. This has been scheduled for 06/11/14 with Dr. Caryl Comes. Dr. Caryl Comes felt that she was stable enough to be discharged home until the day of her surgery.   She was also seen and examined by Dr. Burt Knack who agreed that she was stable for discharge. As mentioned above, she will return 06/11/14 for elective ICD. Post surgical wound f/u will be arranged. Post hospital f/u has been arranged for 07/02/14 with Cecilie Kicks, NP.   Consults: Electrophysiology  Significant Diagnostic Studies:   2D echo Study Conclusions  - Left ventricle: Severe septal , apical and inferior hypokinesis. The cavity size was severely dilated. Wall thickness was normal. Systolic function was severely reduced. The estimated ejection fraction was in the range of 20% to 25%. - Aortic valve: There was trivial regurgitation. - Mitral valve: There was moderate regurgitation. - Left atrium: The atrium was mildly dilated. - Atrial septum: No defect or patent foramen ovale was identified.   NST 05/20/14 Impression Exercise Capacity: Lexiscan with no exercise. BP Response: Normal blood pressure response. Clinical Symptoms:  There is dyspnea. ECG Impression: Baseline: LBBB. EKG uninterpretable due to  LBBB at rest and stress. Comparison with Prior Nuclear Study: Compared to 03/24/10, LV  function is worse and inferior ischemia is  new.  Overall Impression: High risk stress nuclear study with large,  severe, fixed defects in the anterior wall, septum and apex (LBBB artifact vs prior infarct); there is also a large, severe,  partially reversible inferior defect consistent with prior  infarct and mild peri-infarct ischemia.  LV Wall Motion: Severe global hypokinesis.    LHC 05/29/14 HEMODYNAMICS:   AO SYSTOLIC/AO DIASTOLIC: 161/09 LV SYSTOLIC/LV DIASTOLIC: 604/54 Right atrial pressure: 6/4 Right ventricle pressure: Systolic 23 diastolic pressure 6 Pulmonary artery pressure: Systolic 27, diastolic 11, mean 18  Pulmonary Wedge pressure: A wave 10 V-wave 9 mean 9 Cardiac output by thermodilution was 3.46 L/m with an index of 1.76 L/m/m, cardiac output by Fick was 4.57 L/m with an index of 2.32 L/m/m  ANGIOGRAPHIC RESULTS:   1. Left main; 40% smooth distal  2. LAD; Free of significant disease 3. Left circumflex; nondominant and free of significant disease.  4. Right coronary artery; dominant with a patent stent and a most 40% in-stent restenosis 5. Left ventriculography; RAO left ventriculogram was performed using  25 mL of Visipaque dye at 12 mL/second. The overall LVEF estimated  20-25 % With wall motion abnormalities an severe global hypokinesia  Treatments: See Hospital Course  Discharge Exam: Blood pressure 131/64, pulse 82, temperature 97.9 F (36.6 C), temperature source Oral, resp. rate 18, height '5\' 6"'  (1.676 m), weight 193 lb (87.544 kg), SpO2 96 %.   Disposition: 01-Home or Self Care      Discharge Instructions    Diet - low sodium heart healthy    Complete by:  As directed      Increase activity slowly    Complete by:  As directed             Medication List    TAKE these medications        ACCU-CHEK SOFTCLIX LANCETS lancets     AFLURIA PRESERVATIVE FREE injection  Generic drug:  influenza  inactive virus vaccine     ALPRAZolam 1 MG tablet  Commonly known as:   XANAX  Take 1 mg by mouth 4 (four) times daily as needed for anxiety.     amoxicillin-clavulanate 875-125 MG per tablet  Commonly known as:  AUGMENTIN  Take 1 tablet by mouth 2 (two) times daily.     aspirin 81 MG tablet  Take 81 mg by mouth daily.     carvedilol 12.5 MG tablet  Commonly known as:  COREG  Take 1 tablet (12.5 mg total) by mouth 2 (two) times daily with a meal.     citalopram 40 MG tablet  Commonly known as:  CELEXA  Take 40 mg by mouth daily.     clopidogrel 75 MG tablet  Commonly known as:  PLAVIX  Take 1 tablet (75 mg total) by mouth daily.     furosemide 40 MG tablet  Commonly known as:  LASIX  Take 40 mg by mouth daily.     glipiZIDE 5 MG tablet  Commonly known as:  GLUCOTROL  Take 1 tablet by mouth 2 (two) times daily before a meal.     HYDROcodone-acetaminophen 5-325 MG per tablet  Commonly known as:  NORCO/VICODIN  Take 1 tablet by mouth every 6 (six) hours as needed for moderate pain.     ipratropium-albuterol 0.5-2.5 (3)  MG/3ML Soln  Commonly known as:  DUONEB  Take 3 mLs by nebulization every 4 (four) hours as needed. Hasn't started yet due to no nebulizer machine. For shortness of breath     isosorbide mononitrate 30 MG 24 hr tablet  Commonly known as:  IMDUR  Take 1 tablet (30 mg total) by mouth daily.     JANUVIA 50 MG tablet  Generic drug:  sitaGLIPtin  Take 1 tablet by mouth daily.     levothyroxine 112 MCG tablet  Commonly known as:  SYNTHROID, LEVOTHROID  Take 112 mcg by mouth daily before breakfast.     lisinopril 2.5 MG tablet  Commonly known as:  PRINIVIL,ZESTRIL  Take 1 tablet (2.5 mg total) by mouth daily.     methylPREDNISolone 4 MG tablet  Commonly known as:  MEDROL DOSEPAK  Take 4 mg by mouth See admin instructions. follow package directions. Started medication on 05-27-14     nitroGLYCERIN 0.4 MG SL tablet  Commonly known as:  NITROSTAT  Place 1 tablet (0.4 mg total) under the tongue every 5 (five) minutes as needed  for chest pain.     nortriptyline 50 MG capsule  Commonly known as:  PAMELOR  Take 50 mg by mouth daily.     pantoprazole 40 MG tablet  Commonly known as:  PROTONIX     potassium chloride 10 MEQ tablet  Commonly known as:  K-DUR  Take 10 mEq by mouth daily.     simvastatin 20 MG tablet  Commonly known as:  ZOCOR  Take 20 mg by mouth every evening.       Follow-up Information    Follow up with [facility], NP On 07/02/2014.   Specialty:  Cardiology   Why:  9:00 am    Contact information:   [address] [place] [address] [phone]       TIME SPENT ON DISCHARGE, INCLUDING PHYSICIAN TIME: >30 MINUTES  Signed: Lyda Jester 05/30/2014, 12:31 PM

## 2014-05-30 NOTE — Discharge Instructions (Signed)
Return to [facility] 06-11-14 at 12Noon for Biventricular ICD implant with Dr Caryl Comes.  Nothing to eat or drink after midnight the night before procedure. Take a shower night before and morning of procedure.  Ok to take medications with water.  Plan to spend 1 night in the hospital.

## 2014-05-30 NOTE — Progress Notes (Signed)
Patient Profile: 74 y/o female with known CAD, h/o syncope and collapse, chronic LBBB, HTN, HLD and diabetes, admitted for [facility] after recent outpatient echo demonstrated new reduction in systolic function, from [id number] in 2011 down to 20-25%.   Subjective: No complaints. Denies CP, dyspnea, syncope/near syncope. Right femoral access site is ok.   Objective: Vital signs in last 24 hours: Temp:  [97.6 F (36.4 C)-97.9 F (36.6 C)] 97.9 F (36.6 C) (03/25 0500) Pulse Rate:  [81-110] 82 (03/25 0500) Resp:  [13-19] 18 (03/24 2035) BP: (98-137)/(50-86) 131/64 mmHg (03/25 0500) SpO2:  [93 %-98 %] 96 % (03/25 0500) Weight:  [193 lb (87.544 kg)-194 lb (87.998 kg)] 193 lb (87.544 kg) (03/25 0500) Last BM Date: 05/29/14  Intake/Output from previous day: 03/24 0701 - 03/25 0700 In: 577.5 [P.O.:250; I.V.:327.5] Out: 650 [Urine:650] Intake/Output this shift:    Medications Current Facility-Administered Medications  Medication Dose Route Frequency Provider Last Rate Last Dose  . acetaminophen (TYLENOL) tablet 650 mg  650 mg Oral Q4H PRN Lorretta Harp, MD      . ALPRAZolam Duanne Moron) tablet 1 mg  1 mg Oral QID PRN Lorretta Harp, MD   1 mg at 05/29/14 2304  . amoxicillin-clavulanate (AUGMENTIN) 875-125 MG per tablet 1 tablet  1 tablet Oral BID Lorretta Harp, MD   1 tablet at 05/29/14 2102  . aspirin EC tablet 81 mg  81 mg Oral Daily Lorretta Harp, MD   81 mg at 05/30/14 0853  . carvedilol (COREG) tablet 12.5 mg  12.5 mg Oral BID WC Lorretta Harp, MD   12.5 mg at 05/30/14 0740  . citalopram (CELEXA) tablet 40 mg  40 mg Oral Daily Lorretta Harp, MD   40 mg at 05/29/14 2108  . clopidogrel (PLAVIX) tablet 75 mg  75 mg Oral Daily Lorretta Harp, MD   75 mg at 05/30/14 [id number]  . furosemide (LASIX) tablet 40 mg  40 mg Oral Daily Lorretta Harp, MD   40 mg at 05/30/14 [id number]  . glipiZIDE (GLUCOTROL) tablet 5 mg  5 mg Oral BID AC Lorretta Harp, MD   Stopped at 05/30/14 0730  .  HYDROcodone-acetaminophen (NORCO/VICODIN) 5-325 MG per tablet 1 tablet  1 tablet Oral Q6H PRN Lorretta Harp, MD   1 tablet at 05/29/14 1744  . isosorbide mononitrate (IMDUR) 24 hr tablet 30 mg  30 mg Oral Daily Lorretta Harp, MD   30 mg at 05/30/14 [id number]  . levothyroxine (SYNTHROID, LEVOTHROID) tablet 112 mcg  112 mcg Oral QAC breakfast Lorretta Harp, MD   112 mcg at 05/30/14 0730  . linagliptin (TRADJENTA) tablet 5 mg  5 mg Oral Daily Lorretta Harp, MD   5 mg at 05/30/14 [id number]  . methylPREDNISolone (MEDROL) tablet 4 mg  4 mg Oral Daily Lorretta Harp, MD   4 mg at 05/29/14 2114  . morphine 2 MG/ML injection 1 mg  1 mg Intravenous Q1H PRN Lorretta Harp, MD   1 mg at 05/29/14 2103  . nitroGLYCERIN (NITROSTAT) SL tablet 0.4 mg  0.4 mg Sublingual Q5 min PRN Lorretta Harp, MD      . nortriptyline (PAMELOR) capsule 50 mg  50 mg Oral QHS Lorretta Harp, MD   50 mg at 05/29/14 2252  . ondansetron (ZOFRAN) injection 4 mg  4 mg Intravenous Q6H PRN Lorretta Harp, MD      . pantoprazole (PROTONIX) EC tablet 40 mg  40 mg  Oral Daily Lorretta Harp, MD   40 mg at 05/30/14 0851  . potassium chloride (K-DUR) CR tablet 10 mEq  10 mEq Oral Daily Lorretta Harp, MD   10 mEq at 05/30/14 0851  . simvastatin (ZOCOR) tablet 20 mg  20 mg Oral QPM Lorretta Harp, MD   20 mg at 05/29/14 2102    PE: General appearance: alert, cooperative and no distress Neck: no carotid bruit and no JVD Lungs: clear to auscultation bilaterally Heart: regular rate and rhythm, S1, S2 normal, no murmur, click, rub or gallop Extremities: no LEE Pulses: 2+ and symmetric Skin: warm and dry Neurologic: Grossly normal  Lab Results:   Recent Labs  05/27/14 1524  WBC 12.8*  HGB 10.8*  HCT 33.3*  PLT 357   BMET  Recent Labs  05/27/14 1524  NA 142  K 4.2  CL 99  CO2 28  GLUCOSE 128*  BUN 25*  CREATININE 1.34*  CALCIUM 9.6   PT/INR  Recent Labs  05/27/14 1524  LABPROT 13.9  INR 1.07    Studies/Results:  LHC 05/28/13 HEMODYNAMICS:   AO SYSTOLIC/AO DIASTOLIC: 123456  LV SYSTOLIC/LV DIASTOLIC: 123XX123  Right atrial pressure: 6/4  Right ventricle pressure: Systolic 23 diastolic pressure 6  Pulmonary artery pressure: Systolic 27, diastolic 11, mean 18   Pulmonary Wedge pressure: A wave 10 V-wave 9 mean 9  Cardiac output by thermodilution was 3.46 L/m with an index of 1.76 L/m/m, cardiac output by Fick was 4.57 L/m with an index of 2.32 L/m/m  ANGIOGRAPHIC RESULTS:   1. Left main; 40% smooth distal  2. LAD; Free of significant disease 3. Left circumflex; nondominant and free of significant disease.  4. Right coronary artery; dominant with a patent stent and a most 40% in-stent restenosis 5. Left ventriculography; RAO left ventriculogram was performed using  25 mL of Visipaque dye at 12 mL/second. The overall LVEF estimated  20-25 % With wall motion abnormalities an severe global hypokinesia   Assessment/Plan  Active Problems:   Chest pain   Atherosclerosis of native coronary artery of native heart without angina pectoris   Left ventricular dysfunction   Dyspnea on exertion   CAD (coronary artery disease)   1. CAD: LHC yesterday demonstrated unchanged coronary anatomy with 40% distal left main and a patent RCA stent. Continue medical therapy:  ASA, Plavix, BB, low dose ACE, statin and LA nitrate.  2. Left Ventricular Dysfunction: severe LV dysfunction and fairly normal filling pressures with diminished cardiac output. She has a left bundle branch block with a QRS duration of 150 ms. Also with prior history of syncope.  EP evaluated today and recommended ICD implantation for 06/11/14. Continue Coreg and low dose lisinopril. We appreciate EPs assistance.   Dispo: will discharge home today on medical therapy. She will present back 06/11/14 for planned ICD implantation by Dr. Caryl Comes.   [name] [name] 05/30/2014 10:01 AM  Patient seen,  examined. Available data reviewed. Agree with findings, assessment, and plan as outlined by Lyda Jester, PA-C. The patient was independently interviewed and examined. I have reviewed all of her records from the hospitalization. She is scheduled for device implantation by Dr. Caryl Comes in approximately 2 weeks. Her lung fields are clear, heart is regular rate and rhythm, there is no peripheral edema. Cardiac catheterization results reviewed. She is stable for discharge home today. Follow-up has already been arranged.  Sherren Mocha, M.D. 05/30/2014 12:17 PM

## 2014-06-02 ENCOUNTER — Ambulatory Visit: Payer: Medicare FFS | Admitting: Cardiology

## 2014-06-02 ENCOUNTER — Other Ambulatory Visit: Payer: Self-pay | Admitting: Cardiovascular Disease

## 2014-06-02 ENCOUNTER — Encounter: Payer: Self-pay | Admitting: *Deleted

## 2014-06-02 ENCOUNTER — Other Ambulatory Visit: Payer: Self-pay | Admitting: Internal Medicine

## 2014-06-02 MED ORDER — CARVEDILOL 12.5 MG PO TABS: 12.5000 mg | ORAL_TABLET | Freq: Two times a day (BID) | ORAL | Status: DC

## 2014-06-02 NOTE — Telephone Encounter (Signed)
Returned call to patient. She needs carvedilol refilled for a 14 day supply and she will contact PCP for short supply of levothyroxine to be sent to [facility]) sent to pharmacy electronically.

## 2014-06-02 NOTE — Telephone Encounter (Signed)
°  1. Which medications need to be refilled? Levothyroxine-need enough until her mail order comes in  2. Which pharmacy is medication to be sent to?[facility]  3. Do they need a 30 day or 90 day supply? #14  4. Would they like a call back once the medication has been sent to the pharmacy? no

## 2014-06-05 ENCOUNTER — Ambulatory Visit: Payer: Medicare FFS | Admitting: Cardiology

## 2014-06-06 ENCOUNTER — Telehealth: Payer: Self-pay | Admitting: Internal Medicine

## 2014-06-06 NOTE — Telephone Encounter (Signed)
New message      Pt is having a defibulator/pacemaker put in on wed.  She said she has no idea what time to be at the hosp, medications to take/not to take, when to stop eating, etc. Please call next week and tell her what to do.  She said if she had paper work, she lost it

## 2014-06-06 NOTE — Telephone Encounter (Signed)
D/C summary does state Dr Caryl Comes to do procedure 06/11/2014 however I am unable locate any instructions as far as time to be there etc.  Will forward to Trinidad Curet, RN for f/u on Monday.

## 2014-06-09 NOTE — Telephone Encounter (Signed)
Reviewed CRT-D instructions with patient. Advised to be at hospital at noon for a 2 o'clock procedure.   NPO after midnight tomorrow night. Hold diabetic meds and Lasix morning of procedure. Wound check scheduled for 4/20. Patient verbalized understanding and agreeable to plan.

## 2014-06-09 NOTE — Telephone Encounter (Signed)
Patient asked me to call her back on home phone.

## 2014-06-10 DIAGNOSIS — I5022 Chronic systolic (congestive) heart failure: Secondary | ICD-10-CM | POA: Diagnosis not present

## 2014-06-10 DIAGNOSIS — Z7902 Long term (current) use of antithrombotics/antiplatelets: Secondary | ICD-10-CM | POA: Diagnosis not present

## 2014-06-10 DIAGNOSIS — F17210 Nicotine dependence, cigarettes, uncomplicated: Secondary | ICD-10-CM | POA: Diagnosis not present

## 2014-06-10 DIAGNOSIS — E785 Hyperlipidemia, unspecified: Secondary | ICD-10-CM | POA: Diagnosis not present

## 2014-06-10 DIAGNOSIS — Z888 Allergy status to other drugs, medicaments and biological substances status: Secondary | ICD-10-CM | POA: Diagnosis not present

## 2014-06-10 DIAGNOSIS — G4733 Obstructive sleep apnea (adult) (pediatric): Secondary | ICD-10-CM | POA: Diagnosis not present

## 2014-06-10 DIAGNOSIS — I10 Essential (primary) hypertension: Secondary | ICD-10-CM | POA: Diagnosis not present

## 2014-06-10 DIAGNOSIS — Z7982 Long term (current) use of aspirin: Secondary | ICD-10-CM | POA: Diagnosis not present

## 2014-06-10 DIAGNOSIS — I447 Left bundle-branch block, unspecified: Secondary | ICD-10-CM | POA: Diagnosis not present

## 2014-06-10 DIAGNOSIS — I2510 Atherosclerotic heart disease of native coronary artery without angina pectoris: Secondary | ICD-10-CM | POA: Diagnosis not present

## 2014-06-10 DIAGNOSIS — E119 Type 2 diabetes mellitus without complications: Secondary | ICD-10-CM | POA: Diagnosis not present

## 2014-06-10 DIAGNOSIS — I429 Cardiomyopathy, unspecified: Secondary | ICD-10-CM | POA: Diagnosis present

## 2014-06-10 DIAGNOSIS — I255 Ischemic cardiomyopathy: Secondary | ICD-10-CM | POA: Diagnosis not present

## 2014-06-10 MED ORDER — SODIUM CHLORIDE 0.9 % IV SOLN
INTRAVENOUS | Status: DC
  Administered 2014-06-11: 13:00:00 via INTRAVENOUS

## 2014-06-10 MED ORDER — CHLORHEXIDINE GLUCONATE 4 % EX LIQD
60.0000 mL | Freq: Once | CUTANEOUS | Status: DC
  Filled 2014-06-10: qty 60

## 2014-06-10 MED ORDER — MUPIROCIN 2 % EX OINT
TOPICAL_OINTMENT | Freq: Two times a day (BID) | CUTANEOUS | Status: DC
  Administered 2014-06-11: 1 via NASAL
  Administered 2014-06-11 – 2014-06-12 (×2): via NASAL
  Filled 2014-06-10: qty 22

## 2014-06-10 MED ORDER — SODIUM CHLORIDE 0.9 % IR SOLN
80.0000 mg | Status: DC
  Filled 2014-06-10: qty 2

## 2014-06-10 MED ORDER — CEFAZOLIN SODIUM-DEXTROSE 2-3 GM-% IV SOLR: 2.0000 g | INTRAVENOUS | Status: DC

## 2014-06-11 ENCOUNTER — Ambulatory Visit (HOSPITAL_COMMUNITY)
Admission: RE | Admit: 2014-06-11 | Discharge: 2014-06-12 | Disposition: A | Discharge status: Home / Self Care | Payer: Medicare FFS | Source: Ambulatory Visit | Attending: Internal Medicine | Admitting: Internal Medicine

## 2014-06-11 ENCOUNTER — Encounter (HOSPITAL_COMMUNITY): Payer: Self-pay | Admitting: Internal Medicine

## 2014-06-11 ENCOUNTER — Encounter (HOSPITAL_COMMUNITY)
Admission: RE | Disposition: A | Discharge status: Home / Self Care | Payer: Self-pay | Source: Ambulatory Visit | Attending: Internal Medicine

## 2014-06-11 DIAGNOSIS — Z7902 Long term (current) use of antithrombotics/antiplatelets: Secondary | ICD-10-CM | POA: Insufficient documentation

## 2014-06-11 DIAGNOSIS — E119 Type 2 diabetes mellitus without complications: Secondary | ICD-10-CM | POA: Insufficient documentation

## 2014-06-11 DIAGNOSIS — I447 Left bundle-branch block, unspecified: Secondary | ICD-10-CM | POA: Insufficient documentation

## 2014-06-11 DIAGNOSIS — I509 Heart failure, unspecified: Secondary | ICD-10-CM | POA: Diagnosis not present

## 2014-06-11 DIAGNOSIS — Z7982 Long term (current) use of aspirin: Secondary | ICD-10-CM | POA: Insufficient documentation

## 2014-06-11 DIAGNOSIS — I429 Cardiomyopathy, unspecified: Secondary | ICD-10-CM | POA: Diagnosis not present

## 2014-06-11 DIAGNOSIS — G4733 Obstructive sleep apnea (adult) (pediatric): Secondary | ICD-10-CM | POA: Insufficient documentation

## 2014-06-11 DIAGNOSIS — Z959 Presence of cardiac and vascular implant and graft, unspecified: Secondary | ICD-10-CM

## 2014-06-11 DIAGNOSIS — I255 Ischemic cardiomyopathy: Secondary | ICD-10-CM | POA: Insufficient documentation

## 2014-06-11 DIAGNOSIS — I2510 Atherosclerotic heart disease of native coronary artery without angina pectoris: Secondary | ICD-10-CM | POA: Insufficient documentation

## 2014-06-11 DIAGNOSIS — Z888 Allergy status to other drugs, medicaments and biological substances status: Secondary | ICD-10-CM | POA: Insufficient documentation

## 2014-06-11 DIAGNOSIS — E785 Hyperlipidemia, unspecified: Secondary | ICD-10-CM | POA: Insufficient documentation

## 2014-06-11 DIAGNOSIS — F17210 Nicotine dependence, cigarettes, uncomplicated: Secondary | ICD-10-CM | POA: Insufficient documentation

## 2014-06-11 DIAGNOSIS — I5022 Chronic systolic (congestive) heart failure: Secondary | ICD-10-CM | POA: Insufficient documentation

## 2014-06-11 DIAGNOSIS — I10 Essential (primary) hypertension: Secondary | ICD-10-CM | POA: Insufficient documentation

## 2014-06-11 DIAGNOSIS — I5023 Acute on chronic systolic (congestive) heart failure: Secondary | ICD-10-CM | POA: Diagnosis present

## 2014-06-11 HISTORY — PX: BI-VENTRICULAR IMPLANTABLE CARDIOVERTER DEFIBRILLATOR: SHX5459

## 2014-06-11 LAB — BASIC METABOLIC PANEL
Anion gap: 7 (ref 5–15)
BUN: 17 mg/dL (ref 6–23)
CO2: 32 mmol/L (ref 19–32)
Calcium: 9.2 mg/dL (ref 8.4–10.5)
Chloride: 101 mmol/L (ref 96–112)
Creatinine, Ser: 1.48 mg/dL — ABNORMAL HIGH (ref 0.50–1.10)
GFR calc Af Amer: 40 mL/min — ABNORMAL LOW (ref >90)
GFR calc non Af Amer: 34 mL/min — ABNORMAL LOW (ref >90)
Glucose, Bld: 111 mg/dL — ABNORMAL HIGH (ref 70–99)
Potassium: 3.9 mmol/L (ref 3.5–5.1)
Sodium: 140 mmol/L (ref 135–145)

## 2014-06-11 LAB — CBC
HCT: 36.2 % (ref 36.0–46.0)
Hemoglobin: 11.1 g/dL — ABNORMAL LOW (ref 12.0–15.0)
MCH: 28.4 pg (ref 26.0–34.0)
MCHC: 30.7 g/dL (ref 30.0–36.0)
MCV: 92.6 fL (ref 78.0–100.0)
Platelets: 255 10*3/uL (ref 150–400)
RBC: 3.91 MIL/uL (ref 3.87–5.11)
RDW: 15.3 % (ref 11.5–15.5)
WBC: 8.5 10*3/uL (ref 4.0–10.5)

## 2014-06-11 LAB — GLUCOSE, CAPILLARY: Glucose-Capillary: 112 mg/dL — ABNORMAL HIGH (ref 70–99)

## 2014-06-11 LAB — SURGICAL PCR SCREEN
MRSA, PCR: NEGATIVE
Staphylococcus aureus: POSITIVE — AB

## 2014-06-11 SURGERY — BI-VENTRICULAR IMPLANTABLE CARDIOVERTER DEFIBRILLATOR  (CRT-D)
Anesthesia: LOCAL

## 2014-06-11 MED ORDER — CEFAZOLIN SODIUM-DEXTROSE 2-3 GM-% IV SOLR
INTRAVENOUS | Status: AC
  Filled 2014-06-11: qty 50

## 2014-06-11 MED ORDER — NITROGLYCERIN 0.4 MG SL SUBL: 0.4000 mg | SUBLINGUAL_TABLET | SUBLINGUAL | Status: DC | PRN

## 2014-06-11 MED ORDER — FUROSEMIDE 10 MG/ML IJ SOLN
40.0000 mg | Freq: Two times a day (BID) | INTRAMUSCULAR | Status: DC
  Administered 2014-06-11: 40 mg via INTRAVENOUS
  Filled 2014-06-11 (×3): qty 4

## 2014-06-11 MED ORDER — ISOSORBIDE MONONITRATE ER 30 MG PO TB24
30.0000 mg | ORAL_TABLET | Freq: Every day | ORAL | Status: DC
  Administered 2014-06-11 – 2014-06-12 (×2): 30 mg via ORAL
  Filled 2014-06-11 (×2): qty 1

## 2014-06-11 MED ORDER — MIDAZOLAM HCL 5 MG/5ML IJ SOLN
INTRAMUSCULAR | Status: AC
  Filled 2014-06-11: qty 5

## 2014-06-11 MED ORDER — CEFAZOLIN SODIUM 1-5 GM-% IV SOLN
1.0000 g | Freq: Four times a day (QID) | INTRAVENOUS | Status: AC
  Administered 2014-06-11 – 2014-06-12 (×3): 1 g via INTRAVENOUS
  Filled 2014-06-11 (×4): qty 50

## 2014-06-11 MED ORDER — NORTRIPTYLINE HCL 25 MG PO CAPS
50.0000 mg | ORAL_CAPSULE | Freq: Every day | ORAL | Status: DC
Start: 2014-06-11 — End: 2014-06-12
  Administered 2014-06-11 – 2014-06-12 (×2): 50 mg via ORAL
  Filled 2014-06-11 (×2): qty 2

## 2014-06-11 MED ORDER — ASPIRIN EC 81 MG PO TBEC
81.0000 mg | DELAYED_RELEASE_TABLET | Freq: Every day | ORAL | Status: DC
  Administered 2014-06-11 – 2014-06-12 (×2): 81 mg via ORAL
  Filled 2014-06-11 (×2): qty 1

## 2014-06-11 MED ORDER — HYDROCODONE-ACETAMINOPHEN 5-325 MG PO TABS
1.0000 | ORAL_TABLET | Freq: Four times a day (QID) | ORAL | Status: DC | PRN
  Administered 2014-06-11 – 2014-06-12 (×3): 1 via ORAL
  Filled 2014-06-11 (×3): qty 1

## 2014-06-11 MED ORDER — GLIPIZIDE 5 MG PO TABS
5.0000 mg | ORAL_TABLET | Freq: Two times a day (BID) | ORAL | Status: DC
  Administered 2014-06-12: 5 mg via ORAL
  Filled 2014-06-11 (×4): qty 1

## 2014-06-11 MED ORDER — LINAGLIPTIN 5 MG PO TABS
5.0000 mg | ORAL_TABLET | Freq: Every day | ORAL | Status: DC
  Administered 2014-06-11 – 2014-06-12 (×2): 5 mg via ORAL
  Filled 2014-06-11 (×2): qty 1

## 2014-06-11 MED ORDER — MUPIROCIN 2 % EX OINT
1.0000 "application " | TOPICAL_OINTMENT | Freq: Once | CUTANEOUS | Status: DC
  Filled 2014-06-11: qty 22

## 2014-06-11 MED ORDER — LEVOTHYROXINE SODIUM 112 MCG PO TABS
112.0000 ug | ORAL_TABLET | Freq: Every day | ORAL | Status: DC
  Administered 2014-06-12: 112 ug via ORAL
  Filled 2014-06-11 (×2): qty 1

## 2014-06-11 MED ORDER — LIDOCAINE HCL (PF) 1 % IJ SOLN
INTRAMUSCULAR | Status: AC
  Filled 2014-06-11: qty 60

## 2014-06-11 MED ORDER — ONDANSETRON HCL 4 MG/2ML IJ SOLN: 4.0000 mg | Freq: Four times a day (QID) | INTRAMUSCULAR | Status: DC | PRN

## 2014-06-11 MED ORDER — CLOPIDOGREL BISULFATE 75 MG PO TABS
75.0000 mg | ORAL_TABLET | Freq: Every day | ORAL | Status: DC
  Administered 2014-06-11 – 2014-06-12 (×2): 75 mg via ORAL
  Filled 2014-06-11 (×2): qty 1

## 2014-06-11 MED ORDER — PANTOPRAZOLE SODIUM 40 MG PO TBEC
40.0000 mg | DELAYED_RELEASE_TABLET | Freq: Two times a day (BID) | ORAL | Status: DC
  Administered 2014-06-11 – 2014-06-12 (×2): 40 mg via ORAL
  Filled 2014-06-11: qty 1

## 2014-06-11 MED ORDER — POTASSIUM CHLORIDE ER 10 MEQ PO TBCR
10.0000 meq | EXTENDED_RELEASE_TABLET | Freq: Every day | ORAL | Status: DC
  Administered 2014-06-12: 10 meq via ORAL
  Filled 2014-06-11: qty 1

## 2014-06-11 MED ORDER — ALPRAZOLAM 0.5 MG PO TABS: 1.0000 mg | ORAL_TABLET | Freq: Four times a day (QID) | ORAL | Status: DC | PRN

## 2014-06-11 MED ORDER — ACETAMINOPHEN 325 MG PO TABS: 325.0000 mg | ORAL_TABLET | ORAL | Status: DC | PRN

## 2014-06-11 MED ORDER — HEPARIN (PORCINE) IN NACL 2-0.9 UNIT/ML-% IJ SOLN
INTRAMUSCULAR | Status: AC
  Filled 2014-06-11: qty 500

## 2014-06-11 MED ORDER — FUROSEMIDE 10 MG/ML IJ SOLN: 40.0000 mg | Freq: Two times a day (BID) | INTRAMUSCULAR | Status: DC

## 2014-06-11 MED ORDER — CITALOPRAM HYDROBROMIDE 40 MG PO TABS
40.0000 mg | ORAL_TABLET | Freq: Every day | ORAL | Status: DC
  Administered 2014-06-11 – 2014-06-12 (×2): 40 mg via ORAL
  Filled 2014-06-11 (×2): qty 1

## 2014-06-11 MED ORDER — FUROSEMIDE 10 MG/ML IJ SOLN
INTRAMUSCULAR | Status: AC
  Filled 2014-06-11: qty 4

## 2014-06-11 MED ORDER — LISINOPRIL 2.5 MG PO TABS
2.5000 mg | ORAL_TABLET | Freq: Every day | ORAL | Status: DC
  Administered 2014-06-11 – 2014-06-12 (×2): 2.5 mg via ORAL
  Filled 2014-06-11 (×2): qty 1

## 2014-06-11 MED ORDER — SIMVASTATIN 20 MG PO TABS
20.0000 mg | ORAL_TABLET | Freq: Every evening | ORAL | Status: DC
  Administered 2014-06-11: 20 mg via ORAL
  Filled 2014-06-11 (×2): qty 1

## 2014-06-11 MED ORDER — POTASSIUM CHLORIDE ER 10 MEQ PO TBCR
10.0000 meq | EXTENDED_RELEASE_TABLET | Freq: Every day | ORAL | Status: DC
  Filled 2014-06-11: qty 1

## 2014-06-11 MED ORDER — CARVEDILOL 12.5 MG PO TABS
12.5000 mg | ORAL_TABLET | Freq: Two times a day (BID) | ORAL | Status: DC
  Administered 2014-06-12: 12.5 mg via ORAL
  Filled 2014-06-11 (×4): qty 1

## 2014-06-11 MED ORDER — FENTANYL CITRATE 0.05 MG/ML IJ SOLN
INTRAMUSCULAR | Status: AC
  Filled 2014-06-11: qty 2

## 2014-06-11 MED ORDER — HEPARIN (PORCINE) IN NACL 2-0.9 UNIT/ML-% IJ SOLN
INTRAMUSCULAR | Status: AC
  Filled 2014-06-11: qty 1000

## 2014-06-11 MED ORDER — MUPIROCIN 2 % EX OINT
TOPICAL_OINTMENT | CUTANEOUS | Status: AC
  Filled 2014-06-11: qty 22

## 2014-06-11 MED ORDER — SODIUM CHLORIDE 0.9 % IV SOLN
INTRAVENOUS | Status: AC
  Administered 2014-06-11: 19:00:00 via INTRAVENOUS

## 2014-06-11 MED ORDER — METHYLPREDNISOLONE 4 MG PO KIT: 4.0000 mg | PACK | ORAL | Status: DC

## 2014-06-11 MED ORDER — METHYLPREDNISOLONE 4 MG PO TABS
4.0000 mg | ORAL_TABLET | Freq: Every day | ORAL | Status: DC
  Administered 2014-06-11 – 2014-06-12 (×2): 4 mg via ORAL
  Filled 2014-06-11 (×3): qty 1

## 2014-06-11 MED ORDER — FUROSEMIDE 40 MG PO TABS: 40.0000 mg | ORAL_TABLET | Freq: Every day | ORAL | Status: DC

## 2014-06-11 NOTE — Progress Notes (Signed)
Reminded patient that she is not to move her left arm and to have her family help her or call nursing to help. Patient continues to move her arm despite the warning. Will continue to monitor closely. Glade Nurse, RN

## 2014-06-11 NOTE — Interval H&P Note (Signed)
ICD Criteria  Current LVEF:20% ;Obtained < 1 month ago.  NYHA Functional Classification: Class III  Heart Failure History:  Yes, Duration of heart failure since onset is > 9 months  Non-Ischemic Dilated Cardiomyopathy History:  Yes, timeframe is > 9 months  Atrial Fibrillation/Atrial Flutter:  No.  Ventricular Tachycardia History:  No.  Cardiac Arrest History:  No  History of Syndromes with Risk of Sudden Death:  No.  Previous ICD:  No.  Electrophysiology Study: No.  Prior MI: No.  PPM: No.  OSA:  No  Patient Life Expectancy of >=1 year: Yes.  Anticoagulation Therapy:  Patient is NOT on anticoagulation therapy.   Beta Blocker Therapy:  Yes.   Ace Inhibitor/ARB Therapy:  Yes.History and Physical Interval Note:  06/11/2014 2:05 PM  [name]  has presented today for surgery, with the diagnosis of cardiomyopathy  The various methods of treatment have been discussed with the patient and family. After consideration of risks, benefits and other options for treatment, the patient has consented to  Procedure(s): BI-VENTRICULAR IMPLANTABLE CARDIOVERTER DEFIBRILLATOR  (CRT-D) (N/A) as a surgical intervention .  The patient's history has been reviewed, patient examined, no change in status, stable for surgery.  I have reviewed the patient's chart and labs.  Questions were answered to the patient's satisfaction.     Virl Axe

## 2014-06-11 NOTE — Op Note (Signed)
[name] [id number]  [id number]  Preop Dx: NICM CHF LBBB Postop Dx same/   Procedure: ICD-CRT implant  Cx: None   EBL: Minimal   Dictated   Virl Axe, MD 06/11/2014 5:31 PM

## 2014-06-11 NOTE — H&P (View-Only) (Signed)
ELECTROPHYSIOLOGY CONSULT NOTE    Patient ID: [name] MRN: [id number], DOB/AGE: 73-Oct-1942 73 y.o.  Admit date: 05/29/2014 Date of Consult: 05/30/2014  Primary Physician: Nicola Girt, DO Primary Cardiologist: Gwenlyn Found  Reason for Consultation: evaluate for device therapy  HPI:  [name] is a 74 y.o. female with a past medical history significant for CAD, ischemic cardiomyopathy, chronic systolic heart failure, LBBB, sleep apnea, hypertension, hyperlipidemia, and diabetes.  She has been followed by Dr Gwenlyn Found closely.  She has a longstanding cardiomyopathy dating back to 2011 and has recently had increasing dyspnea on exertion and diaphoresis.  Echocardiogram demonstrated intercurrent worsening of her EF prompting re-evaluation of coronary anatomy with cath yesterday.  Catheterization demonstrated unchanged coronary anatomy, EF 20-25% with no targets for revascularization.  EP has been asked to evaluate for treatment options.   She has a remote history of syncope which occurred when she was reaching for paper towels. Her loss of consciousness was only seconds we think.  She more recently has had problems with orthostatic intolerance with recurring presyncope with standing.  Her diuretics were decreased and she has had no recurrence.  She currently does not have significant orthostatic intolerance. However, the decrease in her diuretics coincides with some worsening of her shortness of breath.  She currently has dyspnea with exertion, but denies chest pain, orthopnea, PND.  She has had palpitations that are abrupt in onset and offset but not associated with dizziness or pre-syncope.    She has been on Carvedilol, Imdur, and Lasix.  She has not been on ACE-I or ARB as has renal insufficiency. In this regard notably she also has diabetes.  Past Medical History  Diagnosis Date  . Syncope and collapse     CAROTID DOPPLER, 08/21/2009 - no significant stenosis demonstrated  . LBBB (left  bundle branch block)     NUCLEAR STRESS TEST, 03/24/2010 - significant perfusion defect due to infarct/scar with mild perinfarct ischemia seen in Mid Anterior, Mid Anteroseptal, Apical Anterior, Apical Septal, Apical and Septal regions, post stress EF 63%  . Syncope     2D ECHO, 08/21/2009 - EF 45-50%, normal  . OSA (obstructive sleep apnea)     AHI-9.77/hr, during REM-50.32/hr  . Coronary artery disease   . Hypertension   . Hyperlipidemia   . Diabetes   . Tobacco abuse   . Left ventricular dysfunction     EF 20% with a dilated left ventricle and moderate MR     Surgical History:  Past Surgical History  Procedure Laterality Date  . Cardiac catheterization  03/31/2010    Moderate-noncritical CAD  . Cardiac catheterization  05/04/2007    Recommended medical therapy  . Cardiac catheterization  12/14/2006    Recommended stenting of RCA  . Cardiac catheterization  12/14/2006    RCA stented with a 3.0 [employer] Liberte stent resulting in a reduction of 75% to 0% residual  . Cardiac catheterization  05/14/2004    Recommending medical therapy  . Cardiac catheterization  08/26/1999    No intervention  . Left heart catheterization with coronary angiogram N/A 12/27/2012    Procedure: LEFT HEART CATHETERIZATION WITH CORONARY ANGIOGRAM;  Surgeon: Lorretta Harp, MD;  Location: [facility] CATH LAB;  Service: Cardiovascular;  Laterality: N/A;  . Left and right heart catheterization with coronary angiogram N/A 05/29/2014    Procedure: LEFT AND RIGHT HEART CATHETERIZATION WITH CORONARY ANGIOGRAM;  Surgeon: Lorretta Harp, MD;  Location: [facility] CATH LAB;  Service: Cardiovascular;  Laterality:  N/A;     Prescriptions prior to admission  Medication Sig Dispense Refill Last Dose  . ACCU-CHEK SOFTCLIX LANCETS lancets    05/28/2014 at Unknown time  . AFLURIA PRESERVATIVE FREE injection    05/28/2014 at Unknown time  . ALPRAZolam (XANAX) 1 MG tablet Take 1 mg by mouth 4 (four) times daily as needed for anxiety.     05/29/2014 at 0630  . amoxicillin-clavulanate (AUGMENTIN) 875-125 MG per tablet Take 1 tablet by mouth 2 (two) times daily.    05/28/2014 at 1900  . aspirin 81 MG tablet Take 81 mg by mouth daily.   05/28/2014 at 2200  . carvedilol (COREG) 12.5 MG tablet Take 1 tablet (12.5 mg total) by mouth 2 (two) times daily with a meal. 180 tablet 1 05/29/2014 at 0630  . citalopram (CELEXA) 40 MG tablet Take 40 mg by mouth daily.   05/28/2014 at 2300  . clopidogrel (PLAVIX) 75 MG tablet Take 1 tablet (75 mg total) by mouth daily. 90 tablet 1 05/29/2014 at 0630  . furosemide (LASIX) 40 MG tablet Take 40 mg by mouth daily.    05/29/2014 at 0630  . glipiZIDE (GLUCOTROL) 5 MG tablet Take 1 tablet by mouth 2 (two) times daily before a meal.    05/28/2014 at 2300  . HYDROcodone-acetaminophen (NORCO/VICODIN) 5-325 MG per tablet Take 1 tablet by mouth every 6 (six) hours as needed for moderate pain.   Past Week at Unknown time  . isosorbide mononitrate (IMDUR) 30 MG 24 hr tablet Take 1 tablet (30 mg total) by mouth daily. 30 tablet 3 05/29/2014 at 0630  . JANUVIA 50 MG tablet Take 1 tablet by mouth daily.   05/28/2014 at 1900  . levothyroxine (SYNTHROID, LEVOTHROID) 112 MCG tablet Take 112 mcg by mouth daily before breakfast.   05/28/2014 at 2300  . methylPREDNISolone (MEDROL DOSEPAK) 4 MG tablet Take 4 mg by mouth See admin instructions. follow package directions. Started medication on 05-27-14   05/28/2014 at 2300  . nortriptyline (PAMELOR) 50 MG capsule Take 50 mg by mouth daily.   05/28/2014 at 2230  . pantoprazole (PROTONIX) 40 MG tablet    05/28/2014 at 1900  . potassium chloride (K-DUR) 10 MEQ tablet Take 10 mEq by mouth daily.    05/28/2014 at 0630  . simvastatin (ZOCOR) 20 MG tablet Take 20 mg by mouth every evening.   05/28/2014 at 2300  . ipratropium-albuterol (DUONEB) 0.5-2.5 (3) MG/3ML SOLN Take 3 mLs by nebulization every 4 (four) hours as needed. Hasn't started yet due to no nebulizer machine. For shortness of breath    Unknown at Unknown time  . nitroGLYCERIN (NITROSTAT) 0.4 MG SL tablet Place 1 tablet (0.4 mg total) under the tongue every 5 (five) minutes as needed for chest pain. 25 tablet 3 Unknown at Unknown time    Inpatient Medications:  . amoxicillin-clavulanate  1 tablet Oral BID  . aspirin EC  81 mg Oral Daily  . carvedilol  12.5 mg Oral BID WC  . citalopram  40 mg Oral Daily  . clopidogrel  75 mg Oral Daily  . furosemide  40 mg Oral Daily  . glipiZIDE  5 mg Oral BID AC  . isosorbide mononitrate  30 mg Oral Daily  . levothyroxine  112 mcg Oral QAC breakfast  . linagliptin  5 mg Oral Daily  . methylPREDNISolone  4 mg Oral Daily  . nortriptyline  50 mg Oral QHS  . pantoprazole  40 mg Oral Daily  . potassium chloride  10 mEq Oral Daily  . simvastatin  20 mg Oral QPM    Allergies:  Allergies  Allergen Reactions  . Valium [Diazepam] Swelling    face    History   Social History  . Marital Status: Married    Spouse Name: N/A  . Number of Children: N/A  . Years of Education: N/A   Occupational History  . Not on file.   Social History Main Topics  . Smoking status: Current Every Day Smoker -- 1.00 packs/day for 58 years    Types: Cigarettes    Start date: 12/26/1952  . Smokeless tobacco: Never Used  . Alcohol Use: No  . Drug Use: No  . Sexual Activity: Not on file   Other Topics Concern  . Not on file   Social History Narrative     Family History  Problem Relation Age of Onset  . Stroke Mother   . Hypertension Mother      Review of Systems: General: No chills, fever, night sweats or weight changes  Cardiovascular:  No chest pain, + dyspnea on exertion, no edema, orthopnea, palpitations, paroxysmal nocturnal dyspnea Dermatological: No rash, lesions or masses Respiratory: No cough, dyspnea Urologic: No hematuria, dysuria Abdominal: No nausea, vomiting, diarrhea, bright red blood per rectum, melena, or hematemesis Neurologic: No visual changes, weakness, changes in  mental status All other systems reviewed and are otherwise negative except as noted above.  Physical Exam: Filed Vitals:   05/29/14 2035 05/29/14 2103 05/29/14 2202 05/30/14 0500  BP: 111/59 104/59 122/66 131/64  Pulse: 81   82  Temp: 97.6 F (36.4 C)   97.9 F (36.6 C)  TempSrc: Oral   Oral  Resp: 18     Height:    5\' 6"  (1.676 m)  Weight:    193 lb (87.544 kg)  SpO2: 96%   96%    GEN- The patient is well appearing, alert and oriented x 3 today.   HEENT: normocephalic, atraumatic; sclera clear, conjunctiva pink; hearing intact; oropharynx clear; neck supple, no JVP Lymph- no cervical lymphadenopathy Lungs- Clear to ausculation bilaterally, normal work of breathing.  No wheezes, rales, rhonchi Heart- Regular rate and rhythm, no murmurs, rubs or gallops, PMI not laterally displaced GI- soft, non-tender, non-distended, bowel sounds present, no hepatosplenomegaly Extremities- no clubbing, cyanosis, or edema; DP/PT/radial pulses 2+ bilaterally MS- no significant deformity or atrophy Skin- warm and dry, no rash or lesion Psych- euthymic mood, full affect Neuro- strength and sensation are intact  Labs:   Lab Results  Component Value Date   WBC 12.8* 05/27/2014   HGB 10.8* 05/27/2014   HCT 33.3* 05/27/2014   MCV 90.5 05/27/2014   PLT 357 05/27/2014     Recent Labs Lab 05/27/14 1524  NA 142  K 4.2  CL 99  CO2 28  BUN 25*  CREATININE 1.34*  CALCIUM 9.6  GLUCOSE 128*    EKG: sinus rhythm, rate 89, LBBB QRS 165msec  TELEMETRY: sinus rhythm  Assessment/Plan: 1.  Ischemic cardiomyopathy/chronic systolic heart failure/LBBB The patient has a longstanding cardiomyopathy with no targets for revascularization.  She has been on beta blocker, diuretic, and nitrates.  She has not been on an ACE-I or ARB in the setting of renal insufficiency.  If it is not felt that she is a candidate for ACE or ARB, she meets criteria for ICD implantation.  With LBBB and QRS of 151msec, it  would be expected that she would benefit from CRT    2.  CAD  Catheterization yesterday demonstrated stable coronary anatomy Continue BB, Imdur, Plavix, ASA  3.  HTN Stable on current regimen Blood pressure does not allow for up-titration/addition of other agents at this time  4.  Remote syncope This episode is quite worrisome given its brevity. In the context of left bundle branch block and her cardiomyopathy could represent either heart block and/or ventricular tachycardia and as such, I think proceeding with device implantation sooner rather than later may make sense. I will discuss this with Dr. Hiram Comber.  5. Orthostatic Intolerance this is potentially problematic. The decrease of her diuretics: Coincided with worsening of her dyspnea. Hopefully with CRT we can improve cardiac performance and minimize the need for diuretics. There may be value for support stockings. I'll be a little bit reluctant to try an abdominal binder as I would worry about the degree of RV overfilling. We could do this with some trepidation.  6.  Tachypalpitations  in the context of her cardiomyopathy this is worrisome for possible ventricular arrhythmias  Signed, Chanetta Marshall, NP 05/30/2014 6:56 AM  There were 2 separate issues related to devices. The first is primary prevention given her cardio myopathy. Normally we would anticipate waiting to see what happens with the ACE inhibitor.  However, with her syncope and bundle branch block, it is appropriate to proceed with pacing empirically based on guidelines is a two-way indication  and/or EP testing to identify potential ventricular arrhythmias.  According to AUC it would be appropriate to proceed with device implantation addressing the pacing issue as per guidelines and including a defibrillator.Have reviewed the potential benefits and risks of ICD implantation including but not limited to death, perforation of heart or lung, lead dislodgement, infection,  device  malfunction and inappropriate shocks.  The patient and family express understanding  and are willing to proceed.

## 2014-06-12 ENCOUNTER — Encounter (HOSPITAL_COMMUNITY): Payer: Self-pay | Admitting: *Deleted

## 2014-06-12 ENCOUNTER — Ambulatory Visit (HOSPITAL_COMMUNITY): Payer: Medicare FFS

## 2014-06-12 DIAGNOSIS — I255 Ischemic cardiomyopathy: Secondary | ICD-10-CM | POA: Diagnosis not present

## 2014-06-12 DIAGNOSIS — I429 Cardiomyopathy, unspecified: Secondary | ICD-10-CM

## 2014-06-12 DIAGNOSIS — I447 Left bundle-branch block, unspecified: Secondary | ICD-10-CM | POA: Diagnosis not present

## 2014-06-12 DIAGNOSIS — I5023 Acute on chronic systolic (congestive) heart failure: Secondary | ICD-10-CM | POA: Diagnosis not present

## 2014-06-12 DIAGNOSIS — E119 Type 2 diabetes mellitus without complications: Secondary | ICD-10-CM | POA: Diagnosis not present

## 2014-06-12 DIAGNOSIS — I5022 Chronic systolic (congestive) heart failure: Secondary | ICD-10-CM | POA: Diagnosis not present

## 2014-06-12 LAB — BASIC METABOLIC PANEL
Anion gap: 13 (ref 5–15)
BUN: 19 mg/dL (ref 6–23)
CO2: 23 mmol/L (ref 19–32)
Calcium: 8.6 mg/dL (ref 8.4–10.5)
Chloride: 100 mmol/L (ref 96–112)
Creatinine, Ser: 1.35 mg/dL — ABNORMAL HIGH (ref 0.50–1.10)
GFR calc Af Amer: 44 mL/min — ABNORMAL LOW (ref >90)
GFR calc non Af Amer: 38 mL/min — ABNORMAL LOW (ref >90)
Glucose, Bld: 167 mg/dL — ABNORMAL HIGH (ref 70–99)
Potassium: 4.6 mmol/L (ref 3.5–5.1)
Sodium: 136 mmol/L (ref 135–145)

## 2014-06-12 NOTE — Discharge Summary (Signed)
ELECTROPHYSIOLOGY PROCEDURE DISCHARGE SUMMARY    Patient ID: [name],  MRN: [id number], DOB/AGE: 11-07-1940 74 y.o.  Admit date: 06/11/2014 Discharge date: 06/12/2014  Primary Care Physician: Nicola Girt, DO Primary Cardiologist: Gwenlyn Found Electrophysiologist: Caryl Comes  Primary Discharge Diagnosis:  Non-ischemic cardiomyopathy, congestive heart failure, and LBBB s/p CRTD implantation this admission  Secondary Discharge Diagnosis:  1.  Sleep apnea 2.  Hypertension 3.  Hyperlipidemia 4.  Diabetes 5.  Chronic renal insufficiency 6.  Prior syncope  Allergies  Allergen Reactions  . Valium [Diazepam] Swelling    face     Procedures This Admission:  1.  Implantation of a STJ CRTD on 06-11-14 by Dr Caryl Comes.  See op note for full details. There were no early apparent complications.  DFT's were deferred at time of implant. 2.  CXR on 06-12-14 demonstrated stable lead placement with no ptx  Brief HPI: [name] is a 74 y.o. female with a past medical history significant for CAD, mixed ischemic and non-ischemic cardiomyopathy, congestive heart failure, and LBBB. She recently underwent cardiac catheterization to evaluate for exertional dyspnea and was found to have chronically depressed EF with no targets for revascularization.  With history of syncope, EP was asked to evaluate for device therapy. She was seen by Dr Caryl Comes who recommended CRTD implantation.  Risks, benefits, and alternatives were reviewed with the patient who wished to proceed.  Hospital Course:  The patient was admitted and underwent implantation of a STJ CRTD with details as outlined above.   She was monitored on telemetry overnight which demonstrated sinus rhythm with ventricular pacing.  Left chest was without hematoma or ecchymosis.  The device was interrogated and found to be functioning normally.  CXR was obtained and demonstrated no pneumothorax status post device implantation.  She was diuresed prior to  discharge.  Wound care, arm mobility, and restrictions were reviewed with the patient.  Dr Caryl Comes examined the patient and considered them stable for discharge to home.    Physical Exam: Filed Vitals:   06/11/14 2104 06/11/14 2247 06/12/14 0100 06/12/14 0526  BP: 126/56 123/67 120/63 118/67  Pulse: 92 92 93 92  Temp: 97.5 F (36.4 C) 97.7 F (36.5 C) 98.1 F (36.7 C) 97.7 F (36.5 C)  TempSrc: Oral Oral Oral Oral  Resp: 20 20 18 18   Height:      Weight:      SpO2: 97% 98% 95% 96%    Labs:   Lab Results  Component Value Date   WBC 8.5 06/11/2014   HGB 11.1* 06/11/2014   HCT 36.2 06/11/2014   MCV 92.6 06/11/2014   PLT 255 06/11/2014     Recent Labs Lab 06/12/14 0519  NA 136  K 4.6  CL 100  CO2 23  BUN 19  CREATININE 1.35*  CALCIUM 8.6  GLUCOSE 167*     Discharge Medications:    Medication List    TAKE these medications        ALPRAZolam 1 MG tablet  Commonly known as:  XANAX  Take 1 mg by mouth 4 (four) times daily as needed for anxiety.     aspirin 81 MG tablet  Take 81 mg by mouth daily.     carvedilol 12.5 MG tablet  Commonly known as:  COREG  Take 1 tablet (12.5 mg total) by mouth 2 (two) times daily with a meal.     citalopram 40 MG tablet  Commonly known as:  CELEXA  Take 40 mg  by mouth daily.     clopidogrel 75 MG tablet  Commonly known as:  PLAVIX  Take 1 tablet (75 mg total) by mouth daily.     furosemide 40 MG tablet  Commonly known as:  LASIX  Take 40 mg by mouth daily.     glipiZIDE 5 MG tablet  Commonly known as:  GLUCOTROL  Take 1 tablet by mouth 2 (two) times daily before a meal.     HYDROcodone-acetaminophen 5-325 MG per tablet  Commonly known as:  NORCO/VICODIN  Take 1 tablet by mouth every 6 (six) hours as needed for moderate pain.     ipratropium-albuterol 0.5-2.5 (3) MG/3ML Soln  Commonly known as:  DUONEB  Take 3 mLs by nebulization every 4 (four) hours as needed. Hasn't started yet due to no nebulizer machine. For  shortness of breath     isosorbide mononitrate 30 MG 24 hr tablet  Commonly known as:  IMDUR  Take 1 tablet (30 mg total) by mouth daily.     JANUVIA 50 MG tablet  Generic drug:  sitaGLIPtin  Take 1 tablet by mouth daily.     levothyroxine 112 MCG tablet  Commonly known as:  SYNTHROID, LEVOTHROID  Take 112 mcg by mouth daily before breakfast.     lisinopril 2.5 MG tablet  Commonly known as:  PRINIVIL,ZESTRIL  Take 1 tablet (2.5 mg total) by mouth daily.     methylPREDNISolone 4 MG tablet  Commonly known as:  MEDROL DOSEPAK  Take 4 mg by mouth See admin instructions. follow package directions. Started medication on 05-27-14     nitroGLYCERIN 0.4 MG SL tablet  Commonly known as:  NITROSTAT  Place 1 tablet (0.4 mg total) under the tongue every 5 (five) minutes as needed for chest pain.     nortriptyline 50 MG capsule  Commonly known as:  PAMELOR  Take 50 mg by mouth daily.     pantoprazole 40 MG tablet  Commonly known as:  PROTONIX  Take 40 mg by mouth 2 (two) times daily.     potassium chloride 10 MEQ tablet  Commonly known as:  K-DUR  Take 10 mEq by mouth daily.     simvastatin 20 MG tablet  Commonly known as:  ZOCOR  Take 20 mg by mouth every evening.        Disposition:  Discharge Instructions    Diet - low sodium heart healthy    Complete by:  As directed      Increase activity slowly    Complete by:  As directed           Follow-up Information    Follow up with [facility] On 06/25/2014.   Why:  at 4:30 for wound check   Contact information:   [address] [place] [phone]       Follow up with Isaiah Serge, NP On 07/02/2014.   Specialty:  Cardiology   Why:  at [facility] information:   [address] [place] [address] [phone]       Duration of Discharge Encounter: Greater than 30 minutes including physician time.  Signed, Chanetta Marshall, NP 06/12/2014 9:44 AM

## 2014-06-12 NOTE — Discharge Instructions (Signed)
° ° °  Supplemental Discharge Instructions for  Pacemaker/Defibrillator Patients  Activity No heavy lifting or vigorous activity with your left/right arm for 6 to 8 weeks.  Do not raise your left/right arm above your head for one week.  Gradually raise your affected arm as drawn below.           _          06-14-14             06-15-14                          06-16-14                      4-12-16_  NO DRIVING for 1 week    ; you may begin driving on  [id number]   .  WOUND CARE - Keep the wound area clean and dry.  Do not get this area wet for one week. No showers for one week; you may shower on   06-17-14  . - The tape/steri-strips on your wound will fall off; do not pull them off.  No bandage is needed on the site.  DO  NOT apply any creams, oils, or ointments to the wound area. - If you notice any drainage or discharge from the wound, any swelling or bruising at the site, or you develop a fever > 101? F after you are discharged home, call the office at once.  Special Instructions - You are still able to use cellular telephones; use the ear opposite the side where you have your pacemaker/defibrillator.  Avoid carrying your cellular phone near your device. - When traveling through airports, show security personnel your identification card to avoid being screened in the metal detectors.  Ask the security personnel to use the hand wand. - Avoid arc welding equipment, MRI testing (magnetic resonance imaging), TENS units (transcutaneous nerve stimulators).  Call the office for questions about other devices. - Avoid electrical appliances that are in poor condition or are not properly grounded. - Microwave ovens are safe to be near or to operate.  Additional information for defibrillator patients should your device go off: - If your device goes off ONCE and you feel fine afterward, notify the device clinic nurses. - If your device goes off ONCE and you do not feel well afterward, call 911. - If your  device goes off TWICE, call 911. - If your device goes off THREE times in one day, call 911.  DO NOT DRIVE YOURSELF OR A FAMILY MEMBER WITH A DEFIBRILLATOR TO THE HOSPITAL--CALL 911.

## 2014-06-12 NOTE — Progress Notes (Signed)
Discharge instructions given. Pt verbalized understanding and all questions were answered.  

## 2014-06-12 NOTE — Progress Notes (Signed)
Patient Name: [name]      SUBJECTIVE:without chest pain or shortness of breath No wound soreness  Past Medical History  Diagnosis Date  . Syncope and collapse     CAROTID DOPPLER, 08/21/2009 - no significant stenosis demonstrated  . LBBB (left bundle branch block)     NUCLEAR STRESS TEST, 03/24/2010 - significant perfusion defect due to infarct/scar with mild perinfarct ischemia seen in Mid Anterior, Mid Anteroseptal, Apical Anterior, Apical Septal, Apical and Septal regions, post stress EF 63%  . Syncope     2D ECHO, 08/21/2009 - EF 45-50%, normal  . OSA (obstructive sleep apnea)     AHI-9.77/hr, during REM-50.32/hr  . Coronary artery disease   . Hypertension   . Hyperlipidemia   . Diabetes   . Tobacco abuse   . Left ventricular dysfunction     EF 20% with a dilated left ventricle and moderate MR    Scheduled Meds:  Scheduled Meds: . aspirin EC  81 mg Oral Daily  . carvedilol  12.5 mg Oral BID WC  .  ceFAZolin (ANCEF) IV  1 g Intravenous Q6H  . citalopram  40 mg Oral Daily  . clopidogrel  75 mg Oral Daily  . furosemide  40 mg Intravenous BID  . glipiZIDE  5 mg Oral BID AC  . isosorbide mononitrate  30 mg Oral Daily  . levothyroxine  112 mcg Oral QAC breakfast  . linagliptin  5 mg Oral Daily  . lisinopril  2.5 mg Oral Daily  . methylPREDNISolone  4 mg Oral Daily  . mupirocin ointment   Nasal BID  . nortriptyline  50 mg Oral Daily  . pantoprazole  40 mg Oral BID  . potassium chloride  10 mEq Oral Daily  . simvastatin  20 mg Oral QPM   Continuous Infusions:  acetaminophen, ALPRAZolam, HYDROcodone-acetaminophen, nitroGLYCERIN, ondansetron (ZOFRAN) IV    PHYSICAL EXAM Filed Vitals:   06/11/14 2104 06/11/14 2247 06/12/14 0100 06/12/14 0526  BP: 126/56 123/67 120/63 118/67  Pulse: 92 92 93 92  Temp: 97.5 F (36.4 C) 97.7 F (36.5 C) 98.1 F (36.7 C) 97.7 F (36.5 C)  TempSrc: Oral Oral Oral Oral  Resp: 20 20 18 18   Height:      Weight:        SpO2: 97% 98% 95% 96%    Well developed and nourished in no acute distress HENT normal Neck supple with JVP-flat Clear Regular rate and rhythm, no murmurs or gallops Abd-soft with active BS No Clubbing cyanosis edema Skin-warm and dry A & Oriented  Grossly normal sensory and motor function   TELEMETRY: Reviewed telemetry pt in *P-synchronous/ AV  pacing     Intake/Output Summary (Last 24 hours) at 06/12/14 0851 Last data filed at 06/12/14 0100  Gross per 24 hour  Intake      0 ml  Output   1150 ml  Net  -1150 ml    LABS: Basic Metabolic Panel:  Recent Labs Lab 06/11/14 1241 06/12/14 0519  NA 140 136  K 3.9 4.6  CL 101 100  CO2 32 23  GLUCOSE 111* 167*  BUN 17 19  CREATININE 1.48* 1.35*  CALCIUM 9.2 8.6   Cardiac Enzymes: No results for input(s): CKTOTAL, CKMB, CKMBINDEX, TROPONINI in the last 72 hours. CBC:  Recent Labs Lab 06/11/14 1241  WBC 8.5  HGB 11.1*  HCT 36.2  MCV 92.6  PLT 255      Device Interrogation: normal  device    ASSESSMENT AND PLAN:  CHF  NICM  LBBB  Renal insuffcieincy  Renal function better  Contijnue IV diuresis this am    Anticipate home today   Signed, Virl Axe MD  06/12/2014

## 2014-06-12 NOTE — Op Note (Signed)
[name], [name] NO.:  [id number]  MEDICAL RECORD NO.:  [id number]  LOCATION:  2W33C                        FACILITY:  [facility]  PHYSICIAN:  Deboraha Sprang, MD, FACCDATE OF BIRTH:  01-19-1941  DATE OF PROCEDURE:  06/11/2014 DATE OF DISCHARGE:                              OPERATIVE REPORT   PREOPERATIVE DIAGNOSES:  Nonischemic cardiomyopathy with congestive heart failure, left bundle branch block.  POSTOPERATIVE DIAGNOSES:  Nonischemic cardiomyopathy with congestive heart failure, left bundle branch block.  PROCEDURE:  Dual-chamber defibrillator implantation with left ventricular lead placement.  High-voltage lead assessment.  PROCEDURE IN DETAIL:  Following obtaining informed consent, the patient was brought to the electrophysiology laboratory and placed on the fluoroscopic table in supine position.  After routine prep and drape of the left upper chest, lidocaine was infiltrated in prepectoral and subclavicular regions.  Incision was made and carried down to the layer of the prepectoral fascia using electrocautery and sharp dissection.  A pocket was formed similarly.  Hemostasis was obtained.  Thereafter, attention was turned to gain access to the extrathoracic left subclavian vein, which was accomplished without difficulty without aspiration or puncture of the artery.  Three separate venipunctures were accomplished.  Guidewires were placed and retained sequentially and 8- [place], 9.5, and 7-French sheaths were placed which were passed.  A St. Jude [id number], active-fixation defibrillator lead, serial [id number], a St. Jude 135 and then a St. Jude 115 coronary sinus cannulation catheter and then a St. Jude 1688TC active-fixation atrial lead, serial [id number].  Under fluoroscopic guidance, the RV lead was manipulated to the apex with bipolar R-wave 17 with a pace impedance of 850, a threshold 0.7 V at 0.5 milliseconds.  Current at threshold was 0.9 mA and  there is no diaphragmatic pacing at 10 V.  The current of injury is brisk.  We then turned our attention to gain access to the coronary sinus, which turned out to be somewhat difficult.  There was repeated access to a mid cardiac vein and then a low posterior branch.  It took [place] a while to get into the body of the coronary sinus and then there was a large valve that made it also difficult to pass into the body of the coronary sinus. We ended up using a St. Jude purple delivery sheath, which allowed [place] to pass the sheath past the valve.  However, nonocclusive imaging did not demonstrate any significant veins.  We then went blindly into a lateral branch with the wire but were unable to pass the lead because of the narrowness.  We then turned our attention back to the posterolateral coursing branch, which we were able to cannulate with mild difficulty.  We ended up using a double Wooley wire technique for most of this.  There were 2 branches on this vein which we knew about the 2nd of which we found serendipitously.  The one that we knew about coursed laterally towards the apex.  The other coursed much more onto the lateral wall.  It turned out that the laterally coursing vein had diaphragmatic stimulation across its course, but we found in this laterally coursing branch that coursed more superiorly that in  1-2 configuration, we had L-wave of 2.2, and impedance of 1157 but threshold was only 1 V at 0.5 and there is no diaphragmatic stimulation at 10 V.  The current threshold is 0.9 mA.  We then deployed the atrial lead into the right atrial appendage where the bipolar P-wave was 5 with a pace impedance of 501, a threshold 0.7 V at 0.5 milliseconds.  Current threshold was 3.2 mA.  There was no diaphragmatic pacing at 10 V.  The current of injury was brisk.  This lead was secured to the prepectoral fascia.  We then went and removed the delivery system for the CS lead and secured the LV  lead to the prepectoral fascia.  The pocket was then copiously irrigated with antibiotic-containing saline solution.  Hemostasis was assured.  Surgicel was placed in the posterior aspect of the pocket where I had inadvertently violated the prepectoral fascia.  The leads were attached to a [place] ICD serial [id number].  Through the device, bipolar P-wave was 5 with a pace impedance of 480, threshold 1 V at 0.5.  The R-wave was greater than 12 with a pace impedance of 890, threshold 0.5 at 0.5 and the LV impedance in the 1-2 configuration was 900 ohms with a threshold 0.7-0.5.  High-voltage impedance was 84 ohms. The leads and the device were secured to the prepectoral fascia.  Then the pocket was closed in 2 layers in normal fashion.  The wound was washed, dried, and a Dermabond dressing was applied.  Needle counts, sponge counts, and instrument counts were correct at the end of the procedure according to the staff.  The patient tolerated the procedure without apparent complication.  It was noted that the patient's CVP was quite high and intravenous Lasix was given at the end of the case.     Deboraha Sprang, MD, [facility]     SCK/MEDQ  D:  06/11/2014  T:  06/12/2014  Job:  [id number]

## 2014-06-12 NOTE — Progress Notes (Signed)
Patient with complaints of pain at 0130, patient not due to get pain medication until 0330, Dr. Aundra Dubin notified and per verbal order patient may get 1 Tablet vicodin early, pain medication administered at 0130, will continue to monitor.

## 2014-06-13 ENCOUNTER — Telehealth: Payer: Self-pay | Admitting: *Deleted

## 2014-06-13 NOTE — Telephone Encounter (Signed)
Pt felt several deep dull pains. They were not as intense as a bee sting. I explained the difference between the jolt of an ICD shock vs. nerve pain post surgery. Pt expressed understanding. I advised pt, if the sensations occur again, to simply shift positions if sitting or laying down. Pt aware her sensations are not significant enough to warrant pain med prescription. Pt expressed understanding.  Pt aware of device clinic appt on 06/25/14.

## 2014-06-20 ENCOUNTER — Telehealth: Payer: Self-pay | Admitting: Cardiovascular Disease

## 2014-06-20 NOTE — Telephone Encounter (Signed)
Left message to call back  

## 2014-06-20 NOTE — Telephone Encounter (Signed)
Pt had pacemaker put in on 06-11-14. She wants to know what she can do,she says she is about to go crazy.

## 2014-06-25 ENCOUNTER — Ambulatory Visit (INDEPENDENT_AMBULATORY_CARE_PROVIDER_SITE_OTHER): Payer: Medicare FFS | Admitting: *Deleted

## 2014-06-25 DIAGNOSIS — I447 Left bundle-branch block, unspecified: Secondary | ICD-10-CM | POA: Diagnosis not present

## 2014-06-25 DIAGNOSIS — I519 Heart disease, unspecified: Secondary | ICD-10-CM | POA: Diagnosis not present

## 2014-06-25 DIAGNOSIS — I5023 Acute on chronic systolic (congestive) heart failure: Secondary | ICD-10-CM

## 2014-06-26 LAB — MDC_IDC_ENUM_SESS_TYPE_INCLINIC
Battery Remaining Longevity: 84 mo
Brady Statistic RA Percent Paced: 0.04 %
Brady Statistic RV Percent Paced: 99.32 %
Date Time Interrogation Session: 20160420204131
HighPow Impedance: 67.5 Ohm
Implantable Pulse Generator Serial Number: 7199559
Lead Channel Impedance Value: 1037.5 Ohm
Lead Channel Impedance Value: 400 Ohm
Lead Channel Impedance Value: 712.5 Ohm
Lead Channel Pacing Threshold Amplitude: 0.625 V
Lead Channel Pacing Threshold Amplitude: 0.875 V
Lead Channel Pacing Threshold Amplitude: 1.125 V
Lead Channel Pacing Threshold Pulse Width: 0.5 ms
Lead Channel Pacing Threshold Pulse Width: 0.5 ms
Lead Channel Pacing Threshold Pulse Width: 0.5 ms
Lead Channel Sensing Intrinsic Amplitude: 12 mV
Lead Channel Sensing Intrinsic Amplitude: 5 mV
Lead Channel Setting Pacing Amplitude: 1.875
Lead Channel Setting Pacing Amplitude: 2 V
Lead Channel Setting Pacing Amplitude: 2.125
Lead Channel Setting Pacing Pulse Width: 0.5 ms
Lead Channel Setting Pacing Pulse Width: 0.5 ms
Lead Channel Setting Sensing Sensitivity: 0.5 mV
Zone Setting Detection Interval: 250 ms
Zone Setting Detection Interval: 280 ms
Zone Setting Detection Interval: 330 ms

## 2014-06-26 NOTE — Progress Notes (Signed)
CRT-D wound check appointment. Wound without redness or edema. Incision edges approximated, wound well healed. Normal device function. Thresholds, sensing, and impedances consistent with implant measurements. Device programmed w/ cap confirm on for all leads. Histogram distribution appropriate for patient and level of activity. No mode switches or ventricular arrhythmias noted. Patient educated about wound care, arm mobility, lifting restrictions, shock plan. ROV w/ Dr. Caryl Comes 09/30/14.

## 2014-07-01 DIAGNOSIS — M5440 Lumbago with sciatica, unspecified side: Secondary | ICD-10-CM | POA: Insufficient documentation

## 2014-07-02 ENCOUNTER — Ambulatory Visit: Payer: Medicare FFS | Admitting: Cardiology

## 2014-07-07 ENCOUNTER — Telehealth: Payer: Self-pay | Admitting: *Deleted

## 2014-07-07 NOTE — Telephone Encounter (Signed)
Patient would like to know if she should still be taking the carvedilol. She called Dr Kennon Holter office since he usually refills this for her, but was instructed to call and see what Dr Caryl Comes thought. Patient is out of the medication. Please advise. Thanks, MI

## 2014-07-08 ENCOUNTER — Other Ambulatory Visit: Payer: Self-pay

## 2014-07-08 MED ORDER — CARVEDILOL 12.5 MG PO TABS: 12.5000 mg | ORAL_TABLET | Freq: Two times a day (BID) | ORAL | Status: DC

## 2014-07-08 NOTE — Telephone Encounter (Signed)
Sent in a 30 days of carvedilol 12.5 mg to [facility] per not on 06/12/14 d/c instruction she is still taking it

## 2014-07-09 ENCOUNTER — Other Ambulatory Visit: Payer: Self-pay | Admitting: Neurosurgery

## 2014-07-09 DIAGNOSIS — M5442 Lumbago with sciatica, left side: Secondary | ICD-10-CM

## 2014-07-10 NOTE — Telephone Encounter (Signed)
This should not have been an issue  Please refill  [name] can this go to [facility]  The Pt diD NOT NEED to get this run around

## 2014-07-11 ENCOUNTER — Other Ambulatory Visit: Payer: Self-pay | Admitting: *Deleted

## 2014-07-11 MED ORDER — CARVEDILOL 12.5 MG PO TABS: 12.5000 mg | ORAL_TABLET | Freq: Two times a day (BID) | ORAL | Status: DC

## 2014-07-11 NOTE — Telephone Encounter (Signed)
90d rx refill sent in for Carvedilol to mail order per patient request.

## 2014-07-21 ENCOUNTER — Other Ambulatory Visit: Payer: Self-pay | Admitting: Neurological Surgery

## 2014-07-21 ENCOUNTER — Other Ambulatory Visit (HOSPITAL_COMMUNITY): Payer: Self-pay | Admitting: Neurological Surgery

## 2014-07-21 DIAGNOSIS — M5442 Lumbago with sciatica, left side: Secondary | ICD-10-CM

## 2014-07-28 ENCOUNTER — Ambulatory Visit (HOSPITAL_COMMUNITY): Payer: Medicare FFS

## 2014-07-28 ENCOUNTER — Ambulatory Visit (HOSPITAL_COMMUNITY): Admission: RE | Admit: 2014-07-28 | Payer: Medicare FFS | Source: Ambulatory Visit

## 2014-07-29 ENCOUNTER — Encounter: Payer: Self-pay | Admitting: Internal Medicine

## 2014-07-30 ENCOUNTER — Encounter: Payer: Self-pay | Admitting: Physician Assistant

## 2014-07-30 ENCOUNTER — Ambulatory Visit (INDEPENDENT_AMBULATORY_CARE_PROVIDER_SITE_OTHER): Payer: Medicare FFS | Admitting: Physician Assistant

## 2014-07-30 ENCOUNTER — Telehealth: Payer: Self-pay | Admitting: Internal Medicine

## 2014-07-30 VITALS — BP 108/66 | HR 96 | Ht 66.0 in | Wt 200.0 lb

## 2014-07-30 DIAGNOSIS — I2510 Atherosclerotic heart disease of native coronary artery without angina pectoris: Secondary | ICD-10-CM

## 2014-07-30 DIAGNOSIS — I10 Essential (primary) hypertension: Secondary | ICD-10-CM

## 2014-07-30 DIAGNOSIS — N183 Chronic kidney disease, stage 3 unspecified: Secondary | ICD-10-CM

## 2014-07-30 DIAGNOSIS — Z720 Tobacco use: Secondary | ICD-10-CM

## 2014-07-30 DIAGNOSIS — R531 Weakness: Secondary | ICD-10-CM

## 2014-07-30 DIAGNOSIS — G8929 Other chronic pain: Secondary | ICD-10-CM | POA: Insufficient documentation

## 2014-07-30 DIAGNOSIS — I5022 Chronic systolic (congestive) heart failure: Secondary | ICD-10-CM | POA: Diagnosis not present

## 2014-07-30 NOTE — Telephone Encounter (Signed)
Called patient's home and cell phone, no answer and was unable to leave a message. Received report of xray from Dr. Dominica Severin office. The findings were as follow:    "There is mild pulmonary vascular prominence that suggests low-grade CHF. There is no interstitial or alveolar edema. There is no evidence of pneumonia."  Informed Dayna Dunn PA (Flex) of the results of chest xray. Patient can come today to see her at 3:30 if need be.   Called patient one more time. Patient answered and will be able to come to office this afternoon.

## 2014-07-30 NOTE — Telephone Encounter (Signed)
New Message       Pt calling stating she had a chest xray done at her PCP's office and was told that there is a tube going into her heart and they saw either Pneumonia or "mucus". Pt stating she was told to get an appt with [place] immediately. Please call back and advise.

## 2014-07-30 NOTE — Telephone Encounter (Signed)
Patient calling about a recent chest xray that was done at her PCP's office because of a cough she has been having. Not sure what patient was told exactly by her PCP. Patient stated that they told her she had a tube from her defibrillator to the heart and possibly resembling PNA or mucus. Asked patient if they were referring to her lungs when mentioning the PNA and mucus. Patient informed the office that PCP was referring to her heart and she need to follow up with our office immediately. Will go consult Dayna Dunn PA (Flex). Need to get results of patient's xray. Called patient's PCP (Dr. Suzy Bouchard)  office [phone]) for a fax of chest x-ray report. They will send fax.

## 2014-07-30 NOTE — Patient Instructions (Signed)
Medication Instructions:  None  Labwork: BNP today  Testing/Procedures: None  Follow-Up: Follow up as currently ordered  Any Other Special Instructions Will Be Listed Below (If Applicable).

## 2014-07-30 NOTE — Progress Notes (Signed)
Cardiology Office Note Date:  07/30/2014  Patient ID:  [name], DOB 04/19/1940, MRN [id number] PCP:  Nicola Girt, DO  Cardiologist:  Gwenlyn Found Electrophysiologist: Caryl Comes  Chief Complaint: weakness, body aches, chills  History of Present Illness: [name] is a 74 y.o. female with history of CAD (remote RCA stenting in 2008 with non-DES), mixed ischemic/non-ischemic cardiomyopathy, chronic systolic CHF, LBBB, s/p CRT-D 06/2014, HTN, HLD, DM, CKD stage III, tobacco abuse, history of syncope who presents for evaluation of possible CHF.  She has history of cardiomyopathy with varying EF over the years. Her most recent cardiac workup took place in 3-06/2014. In March 2016 she was complaining of DOE and diaphoresis. 2D echo 05/20/14: severe septal, apical and inferior HK, EF 20-25%, trivial AI, mod MR, mild LAE. She also underwent a nuclear stress test interpreted as high risk stress nuclear study with large, severe, fixed defects in the anterior wall, septum and apex (LBBB artifact vs prior infarct); there was also a large, severe, partially reversible inferior defect consistent with prior infarct and mild peri-infarct ischemia. Subsequently, she was referred for diagnostic left hear catheterization. She presented to [facility] on 05/29/14 for the planned procedure. It was performed by Dr. Gwenlyn Found. Access was obtained via the right femoral artery. She was found to have unchanged coronary anatomy with 40% distal left main and a patent RCA stent with severe LV dysfunction and fairly normal filling pressures with diminished cardiac output. Continued medical therapy was recommended. Given her severe LV dysfunction, LBBB with a QRS duration of 150 ms and h/o prior unexplained syncope, EP was consulted. Dr. Caryl Comes agreed that she met criteria for ICD implantation for prevention of SCD. She underwent this procedure 06/12/14 and did well. She was diuresed prior to discharge. Last labs 06/12/14: glu 167, BUN 19, Cr 1.35, K  4.6, Hgb 11.1. Wound/pacemaker check 06/25/14 in device clinic went well. Of note she has prior history of chronic pain on methadone. She is no longer on this. At [facility] visit from 06/2014 reports the patient was taking additional doses of her Vicodin and also taking oxycodone that she had from a friend for her chronic chest pain. Blood pressure was low. She was advised to increase fluid intake and hold lisinopril and isosorbide for a few days and keep a BP log. She was also counseled to only take medication prescribed to her.  She presents for evaluation of possible CHF. She was seen at [facility] yesterday for fever, body aches, chills, chest congestion, nonproductive cough and fatigue. She had told her PCP that symptoms started ever since her pacemaker but to me she reports this started the day before yesterday. She took her temp at home while having chills and it was 99+. At PCP visit, temp was 97.8. She was started on Levaquin. CXR showed mild pulmonary vascular prominence suggesting low grade CHF, no interstitial or alveolar edema, no evidence of PNA. She was asked to follow-up here. Labs notable for Na 133, K 4.2, Cl 95, CO2 26, glu 202, BUN 16, Cr 1.30, albumin 3.9, ALPT 89, ALT 11, AST 11, Tbili 0.3, Total protein 7.5, WBC 12.1, Hgb 11.5, Hct 35.4, plt 219, increased neutrophils. UA showed low specific gravity, trace glucose.   She actually feels somewhat better today. Still has a nonproductive "mucusy" feeling cough. She reports no SOB, orthopnea, PND, LEE, chest pain or palpitations. Weight is up but she reports that there's a possibility this may be actual body weight. Her diet has  not been so great lately. She has history of severe orthostasis/syncope on higher doses of diuretics in the past. She quit smoking but still vapes.  Past Medical History  Diagnosis Date  . Syncope and collapse     CAROTID DOPPLER, 08/21/2009 - no significant stenosis demonstrated  . LBBB (left  bundle branch block)   . Syncope     2D ECHO, 08/21/2009 - EF 45-50%, normal (but later EFs abnormal)  . OSA (obstructive sleep apnea)     AHI-9.77/hr, during REM-50.32/hr  . Coronary artery disease     a. remote RCA stenting in 2008 with non-DES. b. Cath 06/2014 following abnormal nuc: Stable, unchanged from prior cath, patent stent and 40% LM.  Marland Kitchen Hypertension   . Hyperlipidemia   . Diabetes mellitus   . Tobacco abuse   . Chronic systolic CHF (congestive heart failure)     a. mixed ischemic/non-ischemic cardiomyopathy. Varying EF over the years but most recently 20%, s/p CRT-D in 06/2014.  Marland Kitchen CKD (chronic kidney disease), stage III   . Chronic pain     a. Prior h/o chronic pain on methadone.    Past Surgical History  Procedure Laterality Date  . Cardiac catheterization  03/31/2010    Moderate-noncritical CAD  . Cardiac catheterization  05/04/2007    Recommended medical therapy  . Cardiac catheterization  12/14/2006    Recommended stenting of RCA  . Cardiac catheterization  12/14/2006    RCA stented with a 3.0 [employer] Liberte stent resulting in a reduction of 75% to 0% residual  . Cardiac catheterization  05/14/2004    Recommending medical therapy  . Cardiac catheterization  08/26/1999    No intervention  . Left heart catheterization with coronary angiogram N/A 12/27/2012    Procedure: LEFT HEART CATHETERIZATION WITH CORONARY ANGIOGRAM;  Surgeon: Lorretta Harp, MD;  Location: [facility] CATH LAB;  Service: Cardiovascular;  Laterality: N/A;  . Left and right heart catheterization with coronary angiogram N/A 05/29/2014    Procedure: LEFT AND RIGHT HEART CATHETERIZATION WITH CORONARY ANGIOGRAM;  Surgeon: Lorretta Harp, MD;  Location: [facility] CATH LAB;  Service: Cardiovascular;  Laterality: N/A;  . Bi-ventricular implantable cardioverter defibrillator N/A 06/11/2014    Procedure: BI-VENTRICULAR IMPLANTABLE CARDIOVERTER DEFIBRILLATOR  (CRT-D);  Surgeon: Deboraha Sprang, MD;  Location: [facility] CATH  LAB;  Service: Cardiovascular;  Laterality: N/A;    Current Outpatient Prescriptions  Medication Sig Dispense Refill  . ALPRAZolam (XANAX) 1 MG tablet Take 1 mg by mouth 4 (four) times daily as needed for anxiety.     Marland Kitchen aspirin 81 MG tablet Take 81 mg by mouth daily.    . carvedilol (COREG) 12.5 MG tablet Take 1 tablet (12.5 mg total) by mouth 2 (two) times daily with a meal. 180 tablet 0  . citalopram (CELEXA) 40 MG tablet Take 40 mg by mouth daily.    . clopidogrel (PLAVIX) 75 MG tablet Take 1 tablet (75 mg total) by mouth daily. 90 tablet 1  . furosemide (LASIX) 40 MG tablet Take 40 mg by mouth daily.     Marland Kitchen glipiZIDE (GLUCOTROL) 10 MG tablet Take 10 mg by mouth 2 (two) times daily.    Marland Kitchen HYDROcodone-acetaminophen (NORCO/VICODIN) 5-325 MG per tablet Take 1 tablet by mouth every 6 (six) hours as needed for moderate pain.    Marland Kitchen ipratropium-albuterol (DUONEB) 0.5-2.5 (3) MG/3ML SOLN Take 3 mLs by nebulization every 4 (four) hours as needed. Hasn't started yet due to no nebulizer machine. For shortness of breath    .  isosorbide mononitrate (IMDUR) 30 MG 24 hr tablet Take 1 tablet (30 mg total) by mouth daily. 30 tablet 3  . JANUVIA 50 MG tablet Take 1 tablet by mouth daily.    Marland Kitchen levothyroxine (SYNTHROID, LEVOTHROID) 112 MCG tablet Take 112 mcg by mouth daily before breakfast.    . lisinopril (PRINIVIL,ZESTRIL) 2.5 MG tablet Take 1 tablet (2.5 mg total) by mouth daily. 30 tablet 5  . nitroGLYCERIN (NITROSTAT) 0.4 MG SL tablet Place 1 tablet (0.4 mg total) under the tongue every 5 (five) minutes as needed for chest pain. 25 tablet 3  . nortriptyline (PAMELOR) 50 MG capsule Take 50 mg by mouth daily.    . pantoprazole (PROTONIX) 40 MG tablet Take 40 mg by mouth 2 (two) times daily.     . potassium chloride (K-DUR) 10 MEQ tablet Take 10 mEq by mouth daily.     . simvastatin (ZOCOR) 20 MG tablet Take 20 mg by mouth every evening.     No current facility-administered medications for this visit.     Allergies:   Valium   Social History:  The patient  reports that she has been smoking E-cigarettes.  She started smoking about 61 years ago. She has a 58 pack-year smoking history. She has never used smokeless tobacco. She reports that she does not drink alcohol or use illicit drugs.   Family History:  The patient's family history includes Coronary artery disease in her father; Healthy in her sister and sister; Heart disease in her brother; Hypertension in her mother; Other in her brother; Stroke in her brother and mother.  ROS:  Please see the history of present illness.  All other systems are reviewed and otherwise negative.   PHYSICAL EXAM:  VS:  BP 108/66 mmHg  Pulse 96  Ht _0  (1.676 m)  Wt 200 lb (90.719 kg)  BMI 32.30 kg/m2  SpO2 97% BMI: Body mass index is 32.3 kg/(m^2). Well nourished, well developed obese WF, in no acute distress HEENT: normocephalic, atraumatic Neck: no JVD, carotid bruits or masses Cardiac:  normal S1, S2; RRR; no murmurs, rubs, or gallops Lungs:  clear to auscultation bilaterally, no wheezing, rhonchi or rales Abd: soft, nontender, no hepatomegaly, + BS MS: no deformity or atrophy Ext: no edema Skin: warm and dry, no rash Neuro:  moves all extremities spontaneously, no focal abnormalities noted, follows commands Psych: euthymic mood, full affect  EKG:  Done today shows NSR 96bpm, left axis deviation, borderline LBBB as known from before, possible prior anterolateral infarct - no sig change from prior tracing 05/2014  Recent Labs: 05/27/2014: TSH 0.516 06/11/2014: Hemoglobin 11.1*; Platelets 255 06/12/2014: BUN 19; Creatinine 1.35*; Potassium 4.6; Sodium 136  No results found for requested labs within last 365 days.   CrCl cannot be calculated (Patient has no serum creatinine result on file.).   Wt Readings from Last 3 Encounters:  07/30/14 200 lb (90.719 kg)  06/11/14 194 lb (87.998 kg)  05/30/14 193 lb (87.544 kg)    Other studies  reviewed: Additional studies/records reviewed today include: summarized above  ASSESSMENT AND PLAN:  1. Body aches, weakness, elevated temperature, chills and nonproductive cough - symptoms seem more reminiscent of an acute bronchitis picture than CHF. However, her weight is up. Lungs are clear on exam. No pedal edema. Will check BNP to help guide the picture. I do not want to increase her diuretic empirically because of her history of CKD and significant orthostasis in the past on diuretics. Salt restriction reinforced.  2. Chronic systolic CHF s/p CRT-D - continue BB, Lasix, Imdur, lisinopril for now. See above. 3. CAD s/p remote PCI as above - no recurrent angina. 4. Essential hypertension - controlled today. 5. CKD stage III -  Baseline Cr appears 1.3-1.35. 6. Tobacco abuse - she quit smoking but continues to use vapor cigarettes. I congratulated her on stopping smoking but she understands there are also risks associated with vape.  Disposition: Tentatively keep f/u with Richardson Dopp PA-C next week, depending on BNP level we may cancel this. Will keep appointments with Dr. Gwenlyn Found in late June and Dr. Caryl Comes in July.  Current medicines are reviewed at length with the patient today.  The patient did not have any concerns regarding medicines.  Raechel Ache PA-C 07/30/2014 4:15 PM     [address] [place] [address] [phone] (office)  [phone] (fax)

## 2014-07-31 LAB — BRAIN NATRIURETIC PEPTIDE: Pro B Natriuretic peptide (BNP): 82 pg/mL (ref 0.0–100.0)

## 2014-08-07 ENCOUNTER — Ambulatory Visit: Payer: Medicare FFS | Admitting: Physician Assistant

## 2014-08-11 ENCOUNTER — Other Ambulatory Visit: Payer: Self-pay | Admitting: Neurological Surgery

## 2014-08-21 ENCOUNTER — Ambulatory Visit (HOSPITAL_COMMUNITY)
Admission: RE | Admit: 2014-08-21 | Discharge: 2014-08-21 | Disposition: A | Discharge status: Home / Self Care | Payer: Medicare FFS | Source: Ambulatory Visit | Attending: Neurological Surgery | Admitting: Neurological Surgery

## 2014-08-21 DIAGNOSIS — M5442 Lumbago with sciatica, left side: Secondary | ICD-10-CM | POA: Diagnosis present

## 2014-08-21 DIAGNOSIS — M5126 Other intervertebral disc displacement, lumbar region: Secondary | ICD-10-CM | POA: Insufficient documentation

## 2014-08-21 DIAGNOSIS — M5137 Other intervertebral disc degeneration, lumbosacral region: Secondary | ICD-10-CM | POA: Diagnosis not present

## 2014-08-21 DIAGNOSIS — Z981 Arthrodesis status: Secondary | ICD-10-CM | POA: Diagnosis not present

## 2014-08-21 DIAGNOSIS — M4806 Spinal stenosis, lumbar region: Secondary | ICD-10-CM | POA: Diagnosis not present

## 2014-08-21 LAB — GLUCOSE, CAPILLARY: Glucose-Capillary: 172 mg/dL — ABNORMAL HIGH (ref 65–99)

## 2014-08-21 MED ORDER — IOHEXOL 180 MG/ML  SOLN
20.0000 mL | Freq: Once | INTRAMUSCULAR | Status: AC | PRN
Start: 2014-08-21 — End: 2014-08-21
  Administered 2014-08-21: 14 mL via INTRATHECAL

## 2014-08-21 MED ORDER — LIDOCAINE HCL (PF) 1 % IJ SOLN
INTRAMUSCULAR | Status: AC
  Filled 2014-08-21: qty 5

## 2014-08-21 MED ORDER — ONDANSETRON HCL 4 MG/2ML IJ SOLN
4.0000 mg | Freq: Four times a day (QID) | INTRAMUSCULAR | Status: DC | PRN
Start: 2014-08-21 — End: 2014-08-22

## 2014-08-21 MED ORDER — HYDROCODONE-ACETAMINOPHEN 5-325 MG PO TABS
1.0000 | ORAL_TABLET | ORAL | Status: DC | PRN
  Filled 2014-08-21: qty 2

## 2014-08-21 MED ORDER — HYDROCODONE-ACETAMINOPHEN 5-325 MG PO TABS
ORAL_TABLET | ORAL | Status: AC
  Administered 2014-08-21: 2
  Filled 2014-08-21: qty 2

## 2014-08-21 MED ORDER — ONDANSETRON HCL 4 MG/2ML IJ SOLN: 4.0000 mg | Freq: Four times a day (QID) | INTRAMUSCULAR | Status: DC | PRN

## 2014-08-21 MED ORDER — KETOROLAC TROMETHAMINE 30 MG/ML IJ SOLN
30.0000 mg | Freq: Once | INTRAMUSCULAR | Status: AC
  Administered 2014-08-21: 30 mg via INTRAMUSCULAR
  Filled 2014-08-21: qty 1

## 2014-08-21 NOTE — Discharge Instructions (Signed)
Myelography, Care After °These instructions give you information on caring for yourself after your procedure. Your doctor may also give you specific instructions. Call your doctor if you have any problems or questions after your procedure. °HOME CARE °· Rest often the first day. °· When you rest, lie flat, with your head slightly raised (elevated). °· Avoid heavy lifting and activity for 48 hours. °· You may take the bandage (dressing) off 1 day after the test. °GET HELP RIGHT AWAY IF:  °· You have a very bad headache. °· You have a fever. °MAKE SURE YOU: °· Understand these instructions. °· Will watch your condition. °· Will get help right away if you are not doing well or get worse. °Document Released: 12/01/2007 Document Revised: 07/08/2013 Document Reviewed: 06/12/2013 °ExitCare® Patient Information ©2015 [employer]. This information is not intended to replace advice given to you by your health care provider. Make sure you discuss any questions you have with your health care provider. ° ° °

## 2014-08-21 NOTE — Procedures (Signed)
[name] is a 74 year old individual who's had significant problems with back and leg pain she's had previous decompression and stabilization at the level of L2-L3 she has some evidence for adjacent level disease but because she has presence of a pacemaker she needs to undergo myelography in order to determine the exact nature of any compressive phenomenon. She is now being admitted for this procedure.  Pre op Dx: Lumbar stenosis Post op Dx: Lumbar stenosis Procedure: Lumbar myelogram Surgeon: Zeffie Bickert Puncture level: L2-3 Fluid color: Clear colorless Injection: 14 mL iohexol 180 Findings: Adjacent level stenosis above the level of the fusion. Spondylosis at L5-S1 with some lateral recess stenosis. Further evaluation by CT scan

## 2014-09-01 ENCOUNTER — Telehealth: Payer: Self-pay | Admitting: Cardiovascular Disease

## 2014-09-01 NOTE — Telephone Encounter (Signed)
SPOKE TO PATIENT INFORMED HER OF DOCTOR INSTRUCTIONS CONCERNING HOLDING MEDICATIONS. PATIENT STATES THE LAST  TIME SHE TOOK PLAVIX AND ASPIRIN WAS Friday 08/28/14. SHE STATES PROCEDURE IS SCHEDULE FOR 09/05/14 WIT DR Ellene Route. SHE VERBALIZED UNDERSTANDING.

## 2014-09-01 NOTE — Telephone Encounter (Signed)
FORWARD TO Dr Ronald Pippins, [place]

## 2014-09-01 NOTE — Telephone Encounter (Signed)
Pt called in stating that Dr. Kristeen Miss is going to give her a shot in her back on 6/30 and she needed to know if Dr. Gwenlyn Found approved of her holding her plavix and aspirin for the procedure. Please f/u with her as soon as possible   Thanks

## 2014-09-01 NOTE — Telephone Encounter (Signed)
Okay to hold the antiplatelets therapy for the therapy prior to back injection but should be off this for at least a week before the procedure

## 2014-09-03 ENCOUNTER — Ambulatory Visit (INDEPENDENT_AMBULATORY_CARE_PROVIDER_SITE_OTHER): Payer: Medicare FFS | Admitting: Cardiovascular Disease

## 2014-09-03 ENCOUNTER — Encounter: Payer: Self-pay | Admitting: Cardiovascular Disease

## 2014-09-03 VITALS — BP 138/72 | HR 100 | Ht 66.0 in | Wt 204.0 lb

## 2014-09-03 DIAGNOSIS — E785 Hyperlipidemia, unspecified: Secondary | ICD-10-CM

## 2014-09-03 DIAGNOSIS — I519 Heart disease, unspecified: Secondary | ICD-10-CM

## 2014-09-03 DIAGNOSIS — I428 Other cardiomyopathies: Secondary | ICD-10-CM

## 2014-09-03 DIAGNOSIS — I2583 Coronary atherosclerosis due to lipid rich plaque: Secondary | ICD-10-CM

## 2014-09-03 DIAGNOSIS — I10 Essential (primary) hypertension: Secondary | ICD-10-CM

## 2014-09-03 DIAGNOSIS — I429 Cardiomyopathy, unspecified: Secondary | ICD-10-CM

## 2014-09-03 DIAGNOSIS — I2510 Atherosclerotic heart disease of native coronary artery without angina pectoris: Secondary | ICD-10-CM

## 2014-09-03 NOTE — Assessment & Plan Note (Signed)
History of decline in LV function from 45% down to 20% with cath that showed moderate noncritical CAD. She also underwent a biventricular ICD implant by Dr. Jolyn Nap on 06/11/14. She said her symptoms almost immediately improved. She is now almost class I. I'm going to repeat a 2-D echo for LV function.

## 2014-09-03 NOTE — Progress Notes (Signed)
09/03/2014 [name]   November 24, 1940  [id number]  Primary Physician Nicola Girt, DO Primary Cardiologist: Lorretta Harp MD [name]   HPI:  The patient is a 74 year old mildly overweight married Caucasian female, mother of 60, grandmother of 2 grandchildren, whom I last saw in the office 05/27/14.Marland Kitchen She has a history of CAD, status post RCA stenting in the past. She was catheterized by Dr. Janene Madeira May 04, 2007, after abnormal Myoview revealing 30% to 40% eccentric distal left main. A proximal RCA stent was patent and her EF was normal. Other problems include hypertension, hyperlipidemia, and diabetes. She has chronic left bundle branch block. She smokes 1-1/2 packs of cigarettes a day. She has had back surgery by Dr. Lissa Merlin. Myoview stress test performed March 24, 2010, revealed new anteroapical scar, and because of shortness of breath I catheterized her March 31, 2010, revealing 50% "in-stent restenosis" within the RCA stent, which was smooth, 40% distal left main with anatomy unchanged from the prior study and normal LV function. She really denies chest pain or shortness of breath. Her most recent lab work, performed by Dr. Buddy Duty in June, revealed a total cholesterol of 138, LDL of 57, HDL of 44.  I saw her one year ago. Over the last several weeks she developed new onset chest pressure occurring every other day lasting for minutes at a time associated with diaphoresis. I'm concerned that she may have progression of her left main disease or and/or her RCA in-stent restenosis. Her Myoview performed prior to her last cath in January 2012 was remarkable for anteroapical scar versus breast attenuation artifact. Based on her anatomy and her symptoms I elected to proceed with outpatient diagnostic coronary arteriography which I performed on 12/26/12 revealing essentially unchanged anatomy. Her RCA stent was patent her left main was mild. She did have mild to moderate left  ventricular dysfunction with an EF in the 45% range. 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 She gets occasional chest pain which has not changed in frequency or severity. She does complain of back pain and leg pain and is seeing Dr. Ellene Route for this. She has decreased pedal pulses on exam raising the issue of the possibility of peripheral vascular disease. Lower extremity arterial Doppler studies performed in our office 03/14/14 revealed ABIs of 1 bilaterally high-frequency signal in the left external iliac artery although her symptoms are symmetric. She saw Molli Hazard in the office 05/14/14 complaining of dyspnea on exertion and diaphoresis. A 2-D echo performed 05/20/14 as well as a Myoview stress test showed significant worsening of LV function with an EF 20%, a dilated left ventricle and moderate MR with a Myoview that showed scar in the anterior wall apex and septum markedly different than prior functional studies. Based on this result I performed cardiac catheterization on her 05/29/14 revealing unchanged coronary anatomy which was deemed as moderate and stable with severe LV dysfunction and elevated filling pressures. She subsequently underwent biventricular ICD implantation by Dr. Caryl Comes on 06/11/14 with an excellent clinical result which was almost immediate. She now denies chest pain or shortness of breath.   Current Outpatient Prescriptions  Medication Sig Dispense Refill  . ALPRAZolam (XANAX) 1 MG tablet Take 1 mg by mouth 4 (four) times daily.     Marland Kitchen aspirin EC 81 MG tablet Take 81 mg by mouth at bedtime.    . carvedilol (COREG) 12.5  MG tablet Take 1 tablet (12.5 mg total) by mouth 2 (two) times daily with a meal. 180 tablet 0  . citalopram (CELEXA) 40 MG tablet Take 40 mg by mouth daily.    . clopidogrel (PLAVIX) 75 MG tablet Take 1 tablet (75 mg total) by mouth daily. 90 tablet 1  . furosemide (LASIX)  40 MG tablet Take 40 mg by mouth daily.     Marland Kitchen glipiZIDE (GLUCOTROL) 10 MG tablet Take 10 mg by mouth 2 (two) times daily.    Marland Kitchen HYDROcodone-acetaminophen (NORCO/VICODIN) 5-325 MG per tablet Take 1 tablet by mouth every 6 (six) hours as needed for moderate pain.    . isosorbide mononitrate (IMDUR) 30 MG 24 hr tablet Take 1 tablet (30 mg total) by mouth daily. 30 tablet 3  . levothyroxine (SYNTHROID, LEVOTHROID) 112 MCG tablet Take 112 mcg by mouth at bedtime.     Marland Kitchen lisinopril (PRINIVIL,ZESTRIL) 2.5 MG tablet Take 1 tablet (2.5 mg total) by mouth daily. (Patient taking differently: Take 2.5 mg by mouth daily with supper. ) 30 tablet 5  . Mometasone Furoate (ASMANEX HFA) 100 MCG/ACT AERO Inhale 2 puffs into the lungs 2 (two) times daily as needed (shortness of breath or wheezing).    . nitroGLYCERIN (NITROSTAT) 0.4 MG SL tablet Place 1 tablet (0.4 mg total) under the tongue every 5 (five) minutes as needed for chest pain. 25 tablet 3  . nortriptyline (PAMELOR) 50 MG capsule Take 50 mg by mouth at bedtime.     . pantoprazole (PROTONIX) 40 MG tablet Take 40 mg by mouth 2 (two) times daily.     . potassium chloride (K-DUR) 10 MEQ tablet Take 10 mEq by mouth 2 (two) times daily.     . simvastatin (ZOCOR) 20 MG tablet Take 20 mg by mouth daily with supper.     . sitaGLIPtin (JANUVIA) 50 MG tablet Take 50 mg by mouth daily with supper.     No current facility-administered medications for this visit.    Allergies  Allergen Reactions  . Valium [Diazepam] Swelling    face    History   Social History  . Marital Status: Married    Spouse Name: N/A  . Number of Children: N/A  . Years of Education: N/A   Occupational History  . Not on file.   Social History Main Topics  . Smoking status: Current Every Day Smoker -- 1.00 packs/day for 58 years    Types: E-cigarettes    Start date: 12/26/1952  . Smokeless tobacco: Never Used  . Alcohol Use: No  . Drug Use: No  . Sexual Activity: Not on file    Other Topics Concern  . Not on file   Social History Narrative     Review of Systems: General: negative for chills, fever, night sweats or weight changes.  Cardiovascular: negative for chest pain, dyspnea on exertion, edema, orthopnea, palpitations, paroxysmal nocturnal dyspnea or shortness of breath Dermatological: negative for rash Respiratory: negative for cough or wheezing Urologic: negative for hematuria Abdominal: negative for nausea, vomiting, diarrhea, bright red blood per rectum, melena, or hematemesis Neurologic: negative for visual changes, syncope, or dizziness All other systems reviewed and are otherwise negative except as noted above.    Blood pressure 138/72, pulse 100, height 5\' 6"  (1.676 m), weight 204 lb (92.534 kg).  General appearance: alert and no distress Neck: no adenopathy, no carotid bruit, no JVD, supple, symmetrical, trachea midline and thyroid not enlarged, symmetric, no tenderness/mass/nodules Lungs: clear to auscultation bilaterally  Heart: regular rate and rhythm, S1, S2 normal, no murmur, click, rub or gallop Extremities: extremities normal, atraumatic, no cyanosis or edema  EKG not performed today  ASSESSMENT AND PLAN:   Left ventricular dysfunction History of decline in LV function from 45% down to 20% with cath that showed moderate noncritical CAD. She also underwent a biventricular ICD implant by Dr. Jolyn Nap on 06/11/14. She said her symptoms almost immediately improved. She is now almost class I. I'm going to repeat a 2-D echo for LV function.  Hypertension History of hypertension with blood pressure measured at 138/72. She is on carvedilol, and lisinopril. Continue current meds at current dosing  Hyperlipidemia History of hyperlipidemia on Zocor 20 mg a day followed by her PCP  Coronary artery disease History of CAD with recent cardiac catheterization performed by myself 05/29/14 revealing a 40% smooth distal left main and 40% "in-stent  restenosis within the previously placed RCA stent . Subsequent to that cath she underwent right ventricular ICD implantation for nonischemic cardio myopathy and wide QRS      Lorretta Harp MD [facility], [facility] 09/03/2014 10:10 AM

## 2014-09-03 NOTE — Assessment & Plan Note (Signed)
History of hypertension with blood pressure measured at 138/72. She is on carvedilol, and lisinopril. Continue current meds at current dosing

## 2014-09-03 NOTE — Patient Instructions (Signed)
Medication Instructions:  Dr Gwenlyn Found has made no changes in your current medications or treatment plan.  Testing/Procedures: Your physician has requested that you have an echocardiogram. Echocardiography is a painless test that uses sound waves to create images of your heart. It provides your doctor with information about the size and shape of your heart and how well your heart's chambers and valves are working. This procedure takes approximately one hour. There are no restrictions for this procedure.  Follow-Up: Your physician recommends that you schedule a follow-up appointment in 6 months with an extender. Dr Gwenlyn Found recommends that you schedule a follow-up appointment in 1 year. You will receive a reminder letter in the mail two months in advance. If you don't receive a letter, please call our office to schedule the follow-up appointment.

## 2014-09-03 NOTE — Assessment & Plan Note (Signed)
History of hyperlipidemia on Zocor 20 mg a day followed by her PCP

## 2014-09-03 NOTE — Assessment & Plan Note (Signed)
History of CAD with recent cardiac catheterization performed by myself 05/29/14 revealing a 40% smooth distal left main and 40% "in-stent restenosis within the previously placed RCA stent . Subsequent to that cath she underwent right ventricular ICD implantation for nonischemic cardio myopathy and wide QRS

## 2014-09-30 ENCOUNTER — Encounter: Payer: Medicare FFS | Admitting: Internal Medicine

## 2014-10-02 ENCOUNTER — Inpatient Hospital Stay (HOSPITAL_COMMUNITY): Admission: RE | Admit: 2014-10-02 | Payer: Medicare FFS | Source: Ambulatory Visit

## 2014-10-05 NOTE — Progress Notes (Signed)
Electrophysiology Office Note Date: 10/06/2014  ID:  [name], DOB Jan 21, 1941, MRN [id number]  PCP: Nicola Girt, DO Primary Cardiologist: Gwenlyn Found Electrophysiologist: Caryl Comes  CC: Routine ICD follow-up  [name] is a 74 y.o. female seen today for Dr Caryl Comes.  She underwent CRTD implantation in April of this year and presents today for routine EP follow up.  She reports since CRT implant, her HF symptoms are markedly improved.  She was seen by Dr Gwenlyn Found recently who ordered an echo for reassessment of EF -not yet done. She has LE claudication but denies chest pain, palpitations, dyspnea, PND, orthopnea, nausea, vomiting, dizziness, syncope, edema, weight gain, or early satiety.  She has not had ICD shocks.   Device History: STJ CRTD implanted 2016 for NICM, CHF, LBBB History of appropriate therapy: No History of AAD therapy: No   Past Medical History  Diagnosis Date  . Syncope and collapse     CAROTID DOPPLER, 08/21/2009 - no significant stenosis demonstrated  . LBBB (left bundle branch block)   . Syncope     2D ECHO, 08/21/2009 - EF 45-50%, normal (but later EFs abnormal)  . OSA (obstructive sleep apnea)     AHI-9.77/hr, during REM-50.32/hr  . Coronary artery disease     a. remote RCA stenting in 2008 with non-DES. b. Cath 06/2014 following abnormal nuc: Stable, unchanged from prior cath, patent stent and 40% LM.  Marland Kitchen Hypertension   . Hyperlipidemia   . Diabetes mellitus   . Tobacco abuse   . Chronic systolic CHF (congestive heart failure)     a. mixed ischemic/non-ischemic cardiomyopathy. Varying EF over the years but most recently 20%, s/p CRT-D in 06/2014.  Marland Kitchen CKD (chronic kidney disease), stage III   . Chronic pain     a. Prior h/o chronic pain on methadone.  . Nonischemic cardiomyopathy   . Biventricular ICD (implantable cardioverter-defibrillator) in place    Past Surgical History  Procedure Laterality Date  . Cardiac catheterization  03/31/2010   Moderate-noncritical CAD  . Cardiac catheterization  05/04/2007    Recommended medical therapy  . Cardiac catheterization  12/14/2006    Recommended stenting of RCA  . Cardiac catheterization  12/14/2006    RCA stented with a 3.0 [employer] Liberte stent resulting in a reduction of 75% to 0% residual  . Cardiac catheterization  05/14/2004    Recommending medical therapy  . Cardiac catheterization  08/26/1999    No intervention  . Left heart catheterization with coronary angiogram N/A 12/27/2012    Procedure: LEFT HEART CATHETERIZATION WITH CORONARY ANGIOGRAM;  Surgeon: Lorretta Harp, MD;  Location: [facility] C[facility] LAB;  Service: Cardiovascular;  Laterality: N/A;  . Left and right heart catheterization with coronary angiogram N/A 05/29/2014    Procedure: LEFT AND RIGHT HEART CATHETERIZATION WITH CORONARY ANGIOGRAM;  Surgeon: Lorretta Harp, MD;  Location: [facility] CATH LAB;  Service: Cardiovascular;  Laterality: N/A;  . Bi-ventricular implantable cardioverter defibrillator N/A 06/11/2014    Procedure: BI-VENTRICULAR IMPLANTABLE CARDIOVERTER DEFIBRILLATOR  (CRT-D);  Surgeon: Deboraha Sprang, MD;  Location: [facility] CATH LAB;  Service: Cardiovascular;  Laterality: N/A;    Current Outpatient Prescriptions  Medication Sig Dispense Refill  . ALPRAZolam (XANAX) 1 MG tablet Take 1 mg by mouth 4 (four) times daily.     Marland Kitchen aspirin EC 81 MG tablet Take 81 mg by mouth at bedtime.    . carvedilol (COREG) 12.5 MG tablet Take 1 tablet (12.5 mg total) by mouth 2 (two) times  daily with a meal. 180 tablet 0  . citalopram (CELEXA) 40 MG tablet Take 40 mg by mouth daily.    . clopidogrel (PLAVIX) 75 MG tablet Take 1 tablet (75 mg total) by mouth daily. 90 tablet 1  . furosemide (LASIX) 40 MG tablet Take 40 mg by mouth daily.     Marland Kitchen glipiZIDE (GLUCOTROL) 10 MG tablet Take 10 mg by mouth 2 (two) times daily.    Marland Kitchen HYDROcodone-acetaminophen (NORCO/VICODIN) 5-325 MG per tablet Take 1 tablet by mouth every 6 (six) hours as needed  for moderate pain.    . isosorbide mononitrate (IMDUR) 30 MG 24 hr tablet Take 1 tablet (30 mg total) by mouth daily. 30 tablet 3  . levothyroxine (SYNTHROID, LEVOTHROID) 112 MCG tablet Take 112 mcg by mouth at bedtime.     Marland Kitchen lisinopril (PRINIVIL,ZESTRIL) 2.5 MG tablet Take 1 tablet (2.5 mg total) by mouth daily. (Patient taking differently: Take 2.5 mg by mouth daily with supper. ) 30 tablet 5  . Mometasone Furoate (ASMANEX HFA) 100 MCG/ACT AERO Inhale 2 puffs into the lungs 2 (two) times daily as needed (shortness of breath or wheezing).    . nitroGLYCERIN (NITROSTAT) 0.4 MG SL tablet Place 1 tablet (0.4 mg total) under the tongue every 5 (five) minutes as needed for chest pain. 25 tablet 3  . nortriptyline (PAMELOR) 50 MG capsule Take 50 mg by mouth at bedtime.     . pantoprazole (PROTONIX) 40 MG tablet Take 40 mg by mouth 2 (two) times daily.     . potassium chloride (K-DUR) 10 MEQ tablet Take 10 mEq by mouth 2 (two) times daily.     . simvastatin (ZOCOR) 20 MG tablet Take 20 mg by mouth daily with supper.     . sitaGLIPtin (JANUVIA) 50 MG tablet Take 50 mg by mouth daily with supper.     No current facility-administered medications for this visit.    Allergies:   Valium   Social History: History   Social History  . Marital Status: Married    Spouse Name: N/A  . Number of Children: N/A  . Years of Education: N/A   Occupational History  . Not on file.   Social History Main Topics  . Smoking status: Current Every Day Smoker -- 1.00 packs/day for 58 years    Types: E-cigarettes    Start date: 12/26/1952  . Smokeless tobacco: Never Used  . Alcohol Use: No  . Drug Use: No  . Sexual Activity: Not on file   Other Topics Concern  . Not on file   Social History Narrative    Family History: Family History  Problem Relation Age of Onset  . Stroke Mother   . Hypertension Mother   . Coronary artery disease Father   . Stroke Brother   . Heart disease Brother   . Other  Brother     H1N1 VIRUS  . Healthy Sister   . Healthy Sister     Review of Systems: All other systems reviewed and are otherwise negative except as noted above.   Physical Exam: VS:  BP 124/72 mmHg  Pulse 92  Ht 5\' 6"  (1.676 m)  Wt 199 lb (90.266 kg)  BMI 32.13 kg/m2 , BMI Body mass index is 32.13 kg/(m^2).  GEN- The patient is elderly and obese appearing, alert and oriented x 3 today.   HEENT: normocephalic, atraumatic; sclera clear, conjunctiva pink; hearing intact; oropharynx clear; neck supple  Lungs- Clear to ausculation bilaterally, normal work of  breathing.  No wheezes, rales, rhonchi Heart- Regular rate and rhythm (paced) GI- soft, non-tender, non-distended, bowel sounds present  Extremities- no clubbing, cyanosis, or edema; DP/PT/radial pulses 2+ bilaterally MS- no significant deformity or atrophy Skin- warm and dry, no rash or lesion; ICD pocket well healed; scattered ecchymosis bilateral upper extremities Psych- euthymic mood, full affect Neuro- strength and sensation are intact  ICD interrogation- reviewed in detail today,  See PACEART report  EKG:  EKG is ordered today and demonstrates sinus rhythm with V pacing, rate 92  Recent Labs: 05/27/2014: TSH 0.516 06/11/2014: Hemoglobin 11.1*; Platelets 255 06/12/2014: BUN 19; Creatinine, Ser 1.35*; Potassium 4.6; Sodium 136 07/30/2014: Pro B Natriuretic peptide (BNP) 82.0   Wt Readings from Last 3 Encounters:  10/06/14 199 lb (90.266 kg)  09/03/14 204 lb (92.534 kg)  08/21/14 200 lb (90.719 kg)     Other studies Reviewed: Additional studies/ records that were reviewed today include:hospital records, Dr Kennon Holter notes  Assessment and Plan:  1.  Chronic systolic dysfunction euvolemic today Stable on an appropriate medical regimen Normal ICD function See Pace Art report Repeat echo BMET today Heart rates slightly right shifted, may benefit for Corlanor, will defer to Drs Gwenlyn Found and Caryl Comes  2.  HTN Stable No change  required today  3.  CAD No recent ischemic syptoms Continue medical therapy   Current medicines are reviewed at length with the patient today.   The patient does not have concerns regarding her medicines.  The following changes were made today:  none  Labs/ tests ordered today include: BMET, echo   Disposition:   Follow up with [facility] remote transmissions - enrolled in [facility] clinic today, Dr Caryl Comes 9 months, Dr Gwenlyn Found as scheduled   Signed, Chanetta Marshall, NP 10/06/2014 12:01 PM  [place] [address] [place] [address] [phone] (office) [phone] (fax)

## 2014-10-06 ENCOUNTER — Ambulatory Visit (INDEPENDENT_AMBULATORY_CARE_PROVIDER_SITE_OTHER): Payer: Medicare FFS | Admitting: Nurse Practitioner

## 2014-10-06 ENCOUNTER — Encounter: Payer: Self-pay | Admitting: Nurse Practitioner

## 2014-10-06 VITALS — BP 124/72 | HR 92 | Ht 66.0 in | Wt 199.0 lb

## 2014-10-06 DIAGNOSIS — I5022 Chronic systolic (congestive) heart failure: Secondary | ICD-10-CM | POA: Diagnosis not present

## 2014-10-06 DIAGNOSIS — I10 Essential (primary) hypertension: Secondary | ICD-10-CM | POA: Diagnosis not present

## 2014-10-06 LAB — BASIC METABOLIC PANEL
BUN: 25 mg/dL — ABNORMAL HIGH (ref 6–23)
CO2: 28 mEq/L (ref 19–32)
Calcium: 9.4 mg/dL (ref 8.4–10.5)
Chloride: 99 mEq/L (ref 96–112)
Creatinine, Ser: 1.54 mg/dL — ABNORMAL HIGH (ref 0.40–1.20)
GFR: 35.1 mL/min — ABNORMAL LOW (ref >60.00)
Glucose, Bld: 173 mg/dL — ABNORMAL HIGH (ref 70–99)
Potassium: 3.8 mEq/L (ref 3.5–5.1)
Sodium: 136 mEq/L (ref 135–145)

## 2014-10-06 LAB — CUP PACEART INCLINIC DEVICE CHECK
Date Time Interrogation Session: 20160801124023
Lead Channel Setting Pacing Amplitude: 1.875
Lead Channel Setting Pacing Amplitude: 2 V
Lead Channel Setting Pacing Amplitude: 2.125
Lead Channel Setting Pacing Pulse Width: 0.5 ms
Lead Channel Setting Pacing Pulse Width: 0.5 ms
Lead Channel Setting Sensing Sensitivity: 0.5 mV
Pulse Gen Serial Number: 7199559
Zone Setting Detection Interval: 250 ms
Zone Setting Detection Interval: 280 ms
Zone Setting Detection Interval: 330 ms

## 2014-10-06 NOTE — Patient Instructions (Addendum)
Medication Instructions:   Your physician recommends that you continue on your current medications as directed. Please refer to the Current Medication list given to you today.   Labwork:  BMET TODAY    Testing/Procedures:  Your physician has requested that you have an echocardiogram. Echocardiography is a painless test that uses sound waves to create images of your heart. It provides your doctor with information about the size and shape of your heart and how well your heart's chambers and valves are working. This procedure takes approximately one hour. There are no restrictions for this procedure.  Follow-Up:  Your physician wants you to follow-up in:  [place] will receive a reminder letter in the mail two months in advance. If you don't receive a letter, please call our office to schedule the follow-up appointment.  ICM CLINIC WITH LAURIE (ALREADY SCHEDULED)   Any Other Special Instructions Will Be Listed Below (If Applicable).

## 2014-10-07 ENCOUNTER — Telehealth: Payer: Self-pay | Admitting: *Deleted

## 2014-10-07 DIAGNOSIS — Z79899 Other long term (current) drug therapy: Secondary | ICD-10-CM

## 2014-10-07 NOTE — Telephone Encounter (Signed)
Patient notified of results Lab slip mailed

## 2014-10-07 NOTE — Telephone Encounter (Signed)
-----   Message from Lorretta Harp, MD sent at 10/07/2014  8:08 AM EDT ----- Not sure why BMET was drawn or why her SCr is increased. Repeat in 4 weeks. Please find out if there were any recent med changes

## 2014-10-13 ENCOUNTER — Other Ambulatory Visit: Payer: Self-pay | Admitting: Nurse Practitioner

## 2014-10-13 ENCOUNTER — Ambulatory Visit (HOSPITAL_COMMUNITY): Payer: Medicare FFS | Attending: Cardiology

## 2014-10-13 ENCOUNTER — Other Ambulatory Visit: Payer: Self-pay

## 2014-10-13 ENCOUNTER — Other Ambulatory Visit: Payer: Self-pay | Admitting: Cardiology

## 2014-10-13 DIAGNOSIS — I5022 Chronic systolic (congestive) heart failure: Secondary | ICD-10-CM

## 2014-10-13 DIAGNOSIS — F17200 Nicotine dependence, unspecified, uncomplicated: Secondary | ICD-10-CM | POA: Insufficient documentation

## 2014-10-13 DIAGNOSIS — I509 Heart failure, unspecified: Secondary | ICD-10-CM | POA: Diagnosis present

## 2014-10-13 DIAGNOSIS — I10 Essential (primary) hypertension: Secondary | ICD-10-CM | POA: Insufficient documentation

## 2014-10-13 DIAGNOSIS — I517 Cardiomegaly: Secondary | ICD-10-CM | POA: Insufficient documentation

## 2014-10-13 MED ORDER — PERFLUTREN LIPID MICROSPHERE
3.0000 mL | Freq: Once | INTRAVENOUS | Status: AC
  Administered 2014-10-13: 3 mL via INTRAVENOUS

## 2014-10-13 MED ORDER — PERFLUTREN LIPID MICROSPHERE: 3.0000 mL | Freq: Once | INTRAVENOUS | Status: DC

## 2014-11-06 ENCOUNTER — Ambulatory Visit (INDEPENDENT_AMBULATORY_CARE_PROVIDER_SITE_OTHER): Payer: Medicare FFS

## 2014-11-06 ENCOUNTER — Telehealth: Payer: Self-pay | Admitting: Cardiology

## 2014-11-06 DIAGNOSIS — I5023 Acute on chronic systolic (congestive) heart failure: Secondary | ICD-10-CM | POA: Diagnosis not present

## 2014-11-06 DIAGNOSIS — Z95810 Presence of automatic (implantable) cardiac defibrillator: Secondary | ICD-10-CM

## 2014-11-06 NOTE — Telephone Encounter (Signed)
Spoke with pt and reminded pt of remote transmission that is due today. Pt verbalized understanding.   

## 2014-11-07 ENCOUNTER — Other Ambulatory Visit: Payer: Self-pay | Admitting: Cardiovascular Disease

## 2014-11-07 NOTE — Progress Notes (Signed)
EPIC Encounter for ICM Monitoring  Patient Name: [name] is a 74 y.o. female Date: 11/07/2014 Primary Care Physican: Nicola Girt, DO Primary Cardiologist: Gwenlyn Found Electrophysiologist: Caryl Comes Dry Weight: 202 lbs       In the past month, have you:  1. Gained more than 2 pounds in a day or more than 5 pounds in a week? No, she weighs a few times a week.  Education given to weigh same time every day to monitor for fluid retention and to call if weight increases more than 2 pounds in a day or more than 5 pounds in a week.    2. Had changes in your medications (with verification of current medications)? no  3. Had more shortness of breath than is usual for you? no  4. Limited your activity because of shortness of breath? no  5. Not been able to sleep because of shortness of breath? no  6. Had increased swelling in your feet or ankles? no  7. Had symptoms of dehydration (dizziness, dry mouth, increased thirst, decreased urine output) no  8. Had changes in sodium restriction? no  9. Been compliant with medication? Yes   ICM trend:   Follow-up plan: ICM clinic phone appointment on 12/08/2014.  1st encounter for ICM.  Optivol transmission revealed impedance below baseline from 10/29/2014 to 11/01/2014 and 11/03/2014 to 11/06/2014. She reported she had bilateral leg swelling in the last week and taking Lasix 40mg  bid instead of as prescribed daily.  Advised she take Lasix 40mg  daily as prescribed since Lasix can negatively affect kidney function.   Education given to call the office if leg swelling returns or she has other symptoms.  Member reported she broke a foot and currently wearing a boot on one foot.                 Copy of note sent to patient's primary care physician, primary cardiologist, and device following physician.  [name], RN, CCM 11/07/2014 3:35 PM

## 2014-11-07 NOTE — Telephone Encounter (Signed)
Rx request sent to pharmacy.  

## 2014-11-25 ENCOUNTER — Encounter: Payer: Self-pay | Admitting: Internal Medicine

## 2014-12-02 ENCOUNTER — Encounter (HOSPITAL_COMMUNITY): Payer: Self-pay | Admitting: *Deleted

## 2014-12-08 ENCOUNTER — Telehealth: Payer: Self-pay | Admitting: Cardiology

## 2014-12-08 NOTE — Telephone Encounter (Signed)
Spoke with pt and reminded pt of remote transmission that is due today. Pt verbalized understanding.   

## 2014-12-10 ENCOUNTER — Other Ambulatory Visit: Payer: Self-pay | Admitting: Gastroenterology

## 2014-12-10 NOTE — Addendum Note (Signed)
Addended by: Cailan General on: 12/10/2014 03:16 PM   Modules accepted: Orders  

## 2014-12-11 ENCOUNTER — Telehealth: Payer: Self-pay | Admitting: Cardiovascular Disease

## 2014-12-11 NOTE — Telephone Encounter (Signed)
Called received from [place] at Dr. Lafayette Dragon office for clearance to stop pt Plavix. Told that pt cannot stop Plavix as it has not been a year since stent placement.Verbalized understanding, no questions at this time.

## 2014-12-12 ENCOUNTER — Other Ambulatory Visit: Payer: Self-pay | Admitting: Cardiology

## 2014-12-12 NOTE — Progress Notes (Signed)
Patient ID: [name], female   DOB: 02/09/1941, 74 y.o.   MRN: [id number]

## 2014-12-12 NOTE — Progress Notes (Signed)
Unable to reach patient for ICM follow up on 12/08/2014 due to patient did not send a transmission.  Next ICM follow up scheduled for 01/14/2015.

## 2014-12-15 ENCOUNTER — Encounter (HOSPITAL_COMMUNITY): Payer: Self-pay

## 2014-12-15 ENCOUNTER — Encounter (HOSPITAL_COMMUNITY)
Admission: RE | Disposition: A | Discharge status: Home / Self Care | Payer: Medicare FFS | Source: Ambulatory Visit | Attending: Gastroenterology

## 2014-12-15 ENCOUNTER — Ambulatory Visit (HOSPITAL_COMMUNITY)
Admission: RE | Admit: 2014-12-15 | Discharge: 2014-12-15 | Disposition: A | Discharge status: Home / Self Care | Payer: Medicare FFS | Source: Ambulatory Visit | Attending: Gastroenterology | Admitting: Gastroenterology

## 2014-12-15 ENCOUNTER — Ambulatory Visit (HOSPITAL_COMMUNITY): Payer: Medicare FFS | Admitting: Anesthesiology

## 2014-12-15 DIAGNOSIS — Z79891 Long term (current) use of opiate analgesic: Secondary | ICD-10-CM | POA: Diagnosis not present

## 2014-12-15 DIAGNOSIS — R159 Full incontinence of feces: Secondary | ICD-10-CM | POA: Diagnosis not present

## 2014-12-15 DIAGNOSIS — Z950 Presence of cardiac pacemaker: Secondary | ICD-10-CM | POA: Diagnosis not present

## 2014-12-15 DIAGNOSIS — K219 Gastro-esophageal reflux disease without esophagitis: Secondary | ICD-10-CM | POA: Insufficient documentation

## 2014-12-15 DIAGNOSIS — K5730 Diverticulosis of large intestine without perforation or abscess without bleeding: Secondary | ICD-10-CM | POA: Insufficient documentation

## 2014-12-15 DIAGNOSIS — I2510 Atherosclerotic heart disease of native coronary artery without angina pectoris: Secondary | ICD-10-CM | POA: Diagnosis not present

## 2014-12-15 DIAGNOSIS — Z79899 Other long term (current) drug therapy: Secondary | ICD-10-CM | POA: Insufficient documentation

## 2014-12-15 DIAGNOSIS — G4730 Sleep apnea, unspecified: Secondary | ICD-10-CM | POA: Insufficient documentation

## 2014-12-15 DIAGNOSIS — Z955 Presence of coronary angioplasty implant and graft: Secondary | ICD-10-CM | POA: Insufficient documentation

## 2014-12-15 DIAGNOSIS — Z86010 Personal history of colonic polyps: Secondary | ICD-10-CM | POA: Insufficient documentation

## 2014-12-15 DIAGNOSIS — J45909 Unspecified asthma, uncomplicated: Secondary | ICD-10-CM | POA: Insufficient documentation

## 2014-12-15 DIAGNOSIS — J449 Chronic obstructive pulmonary disease, unspecified: Secondary | ICD-10-CM | POA: Insufficient documentation

## 2014-12-15 DIAGNOSIS — F17200 Nicotine dependence, unspecified, uncomplicated: Secondary | ICD-10-CM | POA: Insufficient documentation

## 2014-12-15 DIAGNOSIS — Z7982 Long term (current) use of aspirin: Secondary | ICD-10-CM | POA: Insufficient documentation

## 2014-12-15 DIAGNOSIS — Z7984 Long term (current) use of oral hypoglycemic drugs: Secondary | ICD-10-CM | POA: Diagnosis not present

## 2014-12-15 DIAGNOSIS — E039 Hypothyroidism, unspecified: Secondary | ICD-10-CM | POA: Diagnosis not present

## 2014-12-15 DIAGNOSIS — E785 Hyperlipidemia, unspecified: Secondary | ICD-10-CM | POA: Diagnosis not present

## 2014-12-15 DIAGNOSIS — I509 Heart failure, unspecified: Secondary | ICD-10-CM | POA: Diagnosis not present

## 2014-12-15 DIAGNOSIS — I429 Cardiomyopathy, unspecified: Secondary | ICD-10-CM | POA: Insufficient documentation

## 2014-12-15 DIAGNOSIS — I10 Essential (primary) hypertension: Secondary | ICD-10-CM | POA: Diagnosis not present

## 2014-12-15 DIAGNOSIS — Z7902 Long term (current) use of antithrombotics/antiplatelets: Secondary | ICD-10-CM | POA: Diagnosis not present

## 2014-12-15 DIAGNOSIS — K635 Polyp of colon: Secondary | ICD-10-CM | POA: Insufficient documentation

## 2014-12-15 DIAGNOSIS — R140 Abdominal distension (gaseous): Secondary | ICD-10-CM | POA: Insufficient documentation

## 2014-12-15 DIAGNOSIS — K5900 Constipation, unspecified: Secondary | ICD-10-CM | POA: Insufficient documentation

## 2014-12-15 DIAGNOSIS — E1140 Type 2 diabetes mellitus with diabetic neuropathy, unspecified: Secondary | ICD-10-CM | POA: Diagnosis not present

## 2014-12-15 HISTORY — DX: Anemia, unspecified: D64.9

## 2014-12-15 HISTORY — DX: Presence of automatic (implantable) cardiac defibrillator: Z95.810

## 2014-12-15 HISTORY — DX: Anxiety disorder, unspecified: F41.9

## 2014-12-15 HISTORY — DX: Headache: R51

## 2014-12-15 HISTORY — PX: COLONOSCOPY WITH PROPOFOL: SHX5780

## 2014-12-15 HISTORY — DX: Adverse effect of unspecified anesthetic, initial encounter: T41.45XA

## 2014-12-15 HISTORY — DX: Headache, unspecified: R51.9

## 2014-12-15 HISTORY — DX: Gastro-esophageal reflux disease without esophagitis: K21.9

## 2014-12-15 HISTORY — DX: Other complications of anesthesia, initial encounter: T88.59XA

## 2014-12-15 HISTORY — DX: Unspecified osteoarthritis, unspecified site: M19.90

## 2014-12-15 LAB — GLUCOSE, CAPILLARY: Glucose-Capillary: 146 mg/dL — ABNORMAL HIGH (ref 65–99)

## 2014-12-15 SURGERY — COLONOSCOPY WITH PROPOFOL
Anesthesia: Monitor Anesthesia Care

## 2014-12-15 MED ORDER — LACTATED RINGERS IV SOLN
INTRAVENOUS | Status: DC
  Administered 2014-12-15 (×2): via INTRAVENOUS

## 2014-12-15 MED ORDER — PROPOFOL 10 MG/ML IV BOLUS
INTRAVENOUS | Status: DC | PRN
  Administered 2014-12-15 (×2): 20 mg via INTRAVENOUS

## 2014-12-15 MED ORDER — PROPOFOL 10 MG/ML IV BOLUS
INTRAVENOUS | Status: AC
  Filled 2014-12-15: qty 20

## 2014-12-15 MED ORDER — PROPOFOL 500 MG/50ML IV EMUL
INTRAVENOUS | Status: DC | PRN
  Administered 2014-12-15: 150 ug/kg/min via INTRAVENOUS

## 2014-12-15 MED ORDER — SODIUM CHLORIDE 0.9 % IV SOLN: INTRAVENOUS | Status: DC

## 2014-12-15 MED ORDER — LIDOCAINE HCL (CARDIAC) 20 MG/ML IV SOLN
INTRAVENOUS | Status: DC | PRN
  Administered 2014-12-15: 50 mg via INTRAVENOUS

## 2014-12-15 MED ORDER — FENTANYL CITRATE (PF) 100 MCG/2ML IJ SOLN: 25.0000 ug | INTRAMUSCULAR | Status: DC | PRN

## 2014-12-15 MED ORDER — LIDOCAINE HCL (CARDIAC) 20 MG/ML IV SOLN
INTRAVENOUS | Status: AC
  Filled 2014-12-15: qty 5

## 2014-12-15 SURGICAL SUPPLY — 22 items

## 2014-12-15 NOTE — Transfer of Care (Signed)
Immediate Anesthesia Transfer of Care Note  Patient: [name]  Procedure(s) Performed: Procedure(s): COLONOSCOPY WITH PROPOFOL (N/A)  Patient Location: PACU and Endoscopy Unit  Anesthesia Type:MAC  Level of Consciousness: awake, alert  and patient cooperative  Airway & Oxygen Therapy: Patient Spontanous Breathing and Patient connected to face mask oxygen  Post-op Assessment: Report given to RN and Post -op Vital signs reviewed and stable  Post vital signs: Reviewed and stable  Last Vitals:  Filed Vitals:   12/15/14 1107  BP: 166/84  Pulse: 100  Resp: 17    Complications: No apparent anesthesia complications

## 2014-12-15 NOTE — Discharge Instructions (Addendum)
Call if question or problem otherwise follow-up in 2 months and consider repeat colonos  Colonoscopy, Care After Refer to this sheet in the next few weeks. These instructions provide you with information on caring for yourself after your procedure. Your health care provider may also give you more specific instructions. Your treatment has been planned according to current medical practices, but problems sometimes occur. Call your health care provider if you have any problems or questions after your procedure. WHAT TO EXPECT AFTER THE PROCEDURE  After your procedure, it is typical to have the following:  A small amount of blood in your stool.  Moderate amounts of gas and mild abdominal cramping or bloating. HOME CARE INSTRUCTIONS  Do not drive, operate machinery, or sign important documents for 24 hours.  You may shower and resume your regular physical activities, but move at a slower pace for the first 24 hours.  Take frequent rest periods for the first 24 hours.  Walk around or put a warm pack on your abdomen to help reduce abdominal cramping and bloating.  Drink enough fluids to keep your urine clear or pale yellow.  You may resume your normal diet as instructed by your health care provider. Avoid heavy or fried foods that are hard to digest.  Avoid drinking alcohol for 24 hours or as instructed by your health care provider.  Only take over-the-counter or prescription medicines as directed by your health care provider.  If a tissue sample (biopsy) was taken during your procedure:  Do not take aspirin or blood thinners for 7 days, or as instructed by your health care provider.  Do not drink alcohol for 7 days, or as instructed by your health care provider.  Eat soft foods for the first 24 hours. SEEK MEDICAL CARE IF: You have persistent spotting of blood in your stool 2-3 days after the procedure. SEEK IMMEDIATE MEDICAL CARE IF:  You have more than a small spotting of blood in  your stool.  You pass large blood clots in your stool.  Your abdomen is swollen (distended).  You have nausea or vomiting.  You have a fever.  You have increasing abdominal pain that is not relieved with medicine.   This information is not intended to replace advice given to you by your health care provider. Make sure you discuss any questions you have with your health care provider.   Document Released: 10/06/2003 Document Revised: 12/12/2012 Document Reviewed: 10/29/2012 Elsevier Interactive Patient Education [employer]. copy in 3 years if doing well medically

## 2014-12-15 NOTE — Progress Notes (Signed)
[name] Name 11:07 AM  Subjective: Patient doing better than when we saw her in the office lately and no new medical problems or complaints  Objective: Vital signs stable afebrile exam please see preassessment evaluation  Assessment: Difficult to remove colon polyp  Plan: Okay to proceed with colonoscopy with anesthesia assistance and aware that cardiology does not want [place] to stop her aspirin or Plavix  [facility]  Pager [phone] After 5PM or if no answer call [phone]

## 2014-12-15 NOTE — Anesthesia Preprocedure Evaluation (Signed)
Anesthesia Evaluation  Patient identified by MRN, date of birth, ID band Patient awake    Reviewed: Allergy & Precautions, H&P , NPO status , Patient's Chart, lab work & pertinent test results  Airway Mallampati: III  TM Distance: >3 FB Neck ROM: Full    Dental no notable dental hx. (+) Upper Dentures, Lower Dentures, Dental Advisory Given   Pulmonary sleep apnea , COPD, Current Smoker,    Pulmonary exam normal breath sounds clear to auscultation       Cardiovascular hypertension, Pt. on medications and Pt. on home beta blockers + CAD, + Cardiac Stents and +CHF  + Cardiac Defibrillator  Rhythm:Regular Rate:Normal  Ischemic/nonischemic cardiomyopathy EF 20%   Neuro/Psych  Headaches, negative psych ROS   GI/Hepatic negative GI ROS, Neg liver ROS,   Endo/Other  diabetes, Type 2, Oral Hypoglycemic Agents  Renal/GU Renal disease  negative genitourinary   Musculoskeletal  (+) Arthritis , Osteoarthritis,    Abdominal   Peds  Hematology negative hematology ROS (+) anemia ,   Anesthesia Other Findings   Reproductive/Obstetrics negative OB ROS                             Anesthesia Physical Anesthesia Plan  ASA: IV  Anesthesia Plan: MAC   Post-op Pain Management:    Induction: Intravenous  Airway Management Planned: Simple Face Mask  Additional Equipment:   Intra-op Plan:   Post-operative Plan:   Informed Consent: I have reviewed the patients History and Physical, chart, labs and discussed the procedure including the risks, benefits and alternatives for the proposed anesthesia with the patient or authorized representative who has indicated his/her understanding and acceptance.   Dental advisory given  Plan Discussed with: CRNA  Anesthesia Plan Comments:         Anesthesia Quick Evaluation

## 2014-12-15 NOTE — Op Note (Signed)
[facility] [place], [id number]   COLONOSCOPY PROCEDURE REPORT     EXAM DATE: 2014-12-21  PATIENT NAME:      Allison, Bradley           MR #:      [id number] BIRTHDATE:       [birth year]      VISIT #:     [phone]  ATTENDING:     Clarene Essex, MD     STATUS:     outpatient ASSISTANT:      Cleda Daub and Berlinda Last  INDICATIONS:  The patient is a 74 yr old female here for a colonoscopy due to high risk patient with personal history of colonic polyps. PROCEDURE PERFORMED:     Colonoscopy, diagnostic MEDICATIONS:     Propofol 600 mg IV and Lidocaine 100 mg IV ESTIMATED BLOOD LOSS:     None  CONSENT: The patient understands the risks and benefits of the procedure and understands that these risks include, but are not limited to: sedation, allergic reaction, infection, perforation and/or bleeding. Alternative means of evaluation and treatment include, among others: physical exam, x-rays, and/or surgical intervention. The patient elects to proceed with this endoscopic procedure.  DESCRIPTION OF PROCEDURE: During intra-op preparation period all mechanical & medical equipment was checked for proper function. Hand hygiene and appropriate measures for infection prevention was taken. After the risks, benefits and alternatives of the procedure were thoroughly explained, Informed consent was verified, confirmed and timeout was successfully executed by the treatment team. A digital exam revealed no abnormalities of the rectum. The Pentax Pediatric Colonoscope [id number] endoscope was introduced through the anus and advanced to the cecum, which was identified by both the appendix and ileocecal valve.the prep was fairly adequate lots of washing and suctioning was needed and to advance to the cecum did require multiple abdominal pressures and rolling her on her back and then her right side and in the future rolling her earlier on her right side would  probably make it easierThe instrument was then slowly withdrawn as the colon was fully examined.Estimated blood loss is zero unless otherwise noted in this procedure report. the findings are recorded below      Retroflexed views revealed no abnormalities. The scope was then completely withdrawn from the patient and the procedure terminated. SCOPE WITHDRAWAL TIME: see nurse's note    ADVERSE EVENTS:      There were no immediate complications.  IMPRESSIONS:     1. Few left-sided diverticuli small 2. One descending 1 transverse small polyp seen not biopsied 3. Otherwise within normal limits to the cecum without any obvious cecal residual polyp although some stool was unable to be washed and suctioned from the cecum 4. Otherwise within normal limits to the cecum  RECOMMENDATIONS:     call when necessary otherwise follow-up in 2 months and consider if doing well medically a repeat colonoscopy in 3 years RECALL:     as needed or in 3 years  _____________________________ Clarene Essex, MD eSigned:  Clarene Essex, MD 2014-12-21 12:45 PM   cc:   CPT CODES: ICD CODES:  The ICD and CPT codes recommended by this software are interpretations from the data that the clinical staff has captured with the software.  The verification of the translation of this report to the ICD and CPT codes and modifiers is the sole responsibility of the health care institution and practicing physician where this report was generated.  [employer] will  not be held responsible for the validity of the ICD and CPT codes included on this report.  AMA assumes no liability for data contained or not contained herein. CPT is a Designer, television/film set of the [employer].   PATIENT NAME:  Allison, Bradley MR#: [id number]

## 2014-12-15 NOTE — Anesthesia Postprocedure Evaluation (Signed)
  Anesthesia Post-op Note  Patient: [name]  Procedure(s) Performed: Procedure(s): COLONOSCOPY WITH PROPOFOL (N/A)  Patient Location: PACU  Anesthesia Type: MAC  Level of Consciousness: awake and alert   Airway and Oxygen Therapy: Patient Spontanous Breathing  Post-op Pain: Controlled  Post-op Assessment: Post-op Vital signs reviewed, Patient's Cardiovascular Status Stable and Respiratory Function Stable  Post-op Vital Signs: Reviewed  Filed Vitals:   12/15/14 1247  BP: 118/64  Pulse:   Resp: 19    Complications: No apparent anesthesia complications

## 2014-12-16 ENCOUNTER — Encounter (HOSPITAL_COMMUNITY): Payer: Self-pay | Admitting: Gastroenterology

## 2015-01-14 ENCOUNTER — Telehealth: Payer: Self-pay

## 2015-01-14 ENCOUNTER — Ambulatory Visit (INDEPENDENT_AMBULATORY_CARE_PROVIDER_SITE_OTHER): Payer: Medicare FFS

## 2015-01-14 DIAGNOSIS — I5023 Acute on chronic systolic (congestive) heart failure: Secondary | ICD-10-CM

## 2015-01-14 DIAGNOSIS — Z95810 Presence of automatic (implantable) cardiac defibrillator: Secondary | ICD-10-CM | POA: Diagnosis not present

## 2015-01-14 NOTE — Telephone Encounter (Signed)
ICM transmission received.  Attempted patient call and she requested call back due to she was eating.

## 2015-01-15 ENCOUNTER — Telehealth: Payer: Self-pay | Admitting: Neurology

## 2015-01-15 NOTE — Telephone Encounter (Signed)
Patient called in regarding appointment for episodes of black outs seizures?  Advised patient to get PCP to send a referral in and we would schedule her after we receive the referral.--Ahern requested

## 2015-01-16 NOTE — Progress Notes (Signed)
I typically don't adjust diuretics based soley on weight. I'd prefer her to be seen by a MLP next week to make a eyes on clinical decision

## 2015-01-16 NOTE — Telephone Encounter (Signed)
Spoke with patient.

## 2015-01-16 NOTE — Progress Notes (Addendum)
EPIC Encounter for ICM Monitoring  Patient Name: [name] is a 74 y.o. female Date: 01/16/2015 Primary Care Physican: Nicola Girt, DO Primary Cardiologist: Gwenlyn Found Electrophysiologist: Caryl Comes Dry Weight: 207 lbs       In the past month, have you:  1. Gained more than 2 pounds in a day or more than 5 pounds in a week? Yes, 5 lbs within a week   2. Had changes in your medications (with verification of current medications)? no  3. Had more shortness of breath than is usual for you? no  4. Limited your activity because of shortness of breath? no  5. Not been able to sleep because of shortness of breath? no  6. Had increased swelling in your feet or ankles? Yes, in feet  7. Had symptoms of dehydration (dizziness, dry mouth, increased thirst, decreased urine output) no  8. Had changes in sodium restriction? no  9. Been compliant with medication? Yes   ICM trend:  01/14/2015   Follow-up plan: ICM clinic phone appointment on 02/18/2015.  Corvue daily impedance below reference line x 9 days, 01/04/2015 to 01/13/2015 and trended back to baseline on 01/14/2015 (date of transmission) suggesting fluid retention. She reported she has gained 5 lbs in last week, feet swelling and belly bloating.  She thinks she still is having fluid retention since her weight is still 207 lbs today. (prior weight 202lb).   Current Furosemide dosage 40mg  daily.  She feels like she needs more Furosemide but in the past has complained of weakness after taking higher dosages on regular basis.  She is asking if she can take extra Furosemide dosages for a couple of days to resolve fluid retention.          Advised I would forward this information to Dr Gwenlyn Found for review to determine if wants to make any med changes or recommendations and will call her back with any changes.  Last Creatinine on 10/06/2014 was 1.54 and BUN 25.  Lab results since 2014 have shown Creatinine range from 1.48 to 1.27.   Copy of note sent  to patient's primary care physician, primary cardiologist, and device following physician.  [name], RN, CCM 01/16/2015 12:19 PM    Lorretta Harp, MD at 01/16/2015 4:04 PM     Status: Signed       Expand All Collapse All   I typically don't adjust diuretics based soley on weight. I'd prefer her to be seen by a MLP next week to make a eyes on clinical decision       Notified patient and offered to set up appointment with PA or NP for next week and she declined due to she cannot afford the copay.  She stated she may take an extra Furosemide dosage and informed her that is not the recommendation provided by Dr Gwenlyn Found.  Advised her taking extra medication without physicians approval is not safe.  Explained diuretics can have negative effect on the kidneys and her Creatinine was higher than normal on 10/06/2014 which is monitored for kidney function.  If the current symptoms worsen over the weekend to go to the ER.  Requested she call me on Monday to provide update on how she feels and if she changes her mind at that time, I can schedule her an appointment.

## 2015-01-19 ENCOUNTER — Encounter: Payer: Self-pay | Admitting: Cardiovascular Disease

## 2015-01-23 ENCOUNTER — Other Ambulatory Visit: Payer: Self-pay | Admitting: Cardiovascular Disease

## 2015-01-23 NOTE — Telephone Encounter (Signed)
Rx(s) sent to pharmacy electronically.  

## 2015-01-28 ENCOUNTER — Telehealth: Payer: Self-pay | Admitting: Internal Medicine

## 2015-01-28 NOTE — Telephone Encounter (Signed)
Pt had pacemaker put in April. She is having a lot of swelling in her feet,hands and stomach a little. She is very concerned.

## 2015-01-28 NOTE — Telephone Encounter (Signed)
Spoke with pt, for several weeks she has had swelling in her feet and ankles. She actually feels she is swollen all over. Her weight is up 8 lbs in 3 weeks. She denies SOB. She does admit to eating salt and was educated on the effects of too much salt and sodium intake. She has also had one episode of pain behind her device site. She talked to the nurse from her insurance and she told her to call and get an appointment. Follow up scheduled

## 2015-01-28 NOTE — Telephone Encounter (Signed)
Unable to reach pt or leave a message, mailbox is full. 

## 2015-02-03 ENCOUNTER — Encounter: Payer: Medicare FFS | Admitting: Physician Assistant

## 2015-02-03 NOTE — Progress Notes (Signed)
    Cardiology Office Note   Date:  02/03/2015   ID:  [name], DOB [birth year], MRN [id number]  PCP:  Nicola Girt, DO  Cardiologist:  Dr Gwenlyn Found Electrophysiology: Dr Suzie Portela, PA-C   No chief complaint on file.   History of Present Illness: [name] is a 74 y.o. female with a history of CRTD implantation [id number], systolic CHF w/ EF [id number] by echo 10/2014, BMS RCA 2008, 06/2014 with 40% left main and patent stent, diabetes, hypertension, hyperlipidemia and tobacco use

## 2015-02-11 ENCOUNTER — Encounter: Payer: Self-pay | Admitting: Physician Assistant

## 2015-02-11 ENCOUNTER — Ambulatory Visit (INDEPENDENT_AMBULATORY_CARE_PROVIDER_SITE_OTHER): Payer: Medicare FFS | Admitting: Physician Assistant

## 2015-02-11 ENCOUNTER — Encounter: Payer: Self-pay | Admitting: Cardiovascular Disease

## 2015-02-11 VITALS — BP 132/66 | HR 96 | Ht 66.0 in | Wt 209.0 lb

## 2015-02-11 DIAGNOSIS — R55 Syncope and collapse: Secondary | ICD-10-CM | POA: Diagnosis not present

## 2015-02-11 DIAGNOSIS — R600 Localized edema: Secondary | ICD-10-CM | POA: Diagnosis not present

## 2015-02-11 DIAGNOSIS — Z79899 Other long term (current) drug therapy: Secondary | ICD-10-CM

## 2015-02-11 MED ORDER — FUROSEMIDE 40 MG PO TABS: 40.0000 mg | ORAL_TABLET | Freq: Two times a day (BID) | ORAL | Status: DC

## 2015-02-11 NOTE — Patient Instructions (Addendum)
         Your physician has recommended you make the following change in your medication: the furosemide has been increased to 1 tablet twice a day.   Your physician recommends that you schedule a follow-up nurse blood pressure  and B-MET appointment in  1 week.  You are not to drink no more than 2 liters of fluid per day. No more than 2000 mg of salt per day.  The transmission of your device check today was normal.

## 2015-02-11 NOTE — Progress Notes (Signed)
Cardiology Office Note   Date:  02/11/2015   ID:  [name], DOB September 14, 1940, MRN [id number]  PCP:  Nicola Girt, DO  Cardiologist:   Dr Gwenlyn Found Electrophysiology: Dr Suzie Portela, PA-C   Chief Complaint  Patient presents with  . INCREASED EDEMA  . Shortness of Breath  . Chest Pain    PRESSURE    History of Present Illness: [name] is a 75 y.o. female with a history of CRTD implantation ([employer] [phone] ICD serial [id number]) [id number], systolic CHF w/ EF [id number] by echo 10/2014, BMS RCA 2008, cath 05/2014 with 40% left main and patent stent, diabetes, hypertension, hyperlipidemia and tobacco use;   [name] presents for evaluation of multiple issues.  1. Edema: She has been having edema, right greater than left. Her weight is up about 8 or 9 pounds from her baseline. She admits to dietary indiscretion and loves to eat she does. She states she does not use that much salt but does use it. She is not particularly vigilant about looking for hidden salt and fluids that she eats. Additionally, she states she drinks at least a gallon of water a day. She states that she is getting conflicting information from doctors in that she says someone told her to drink plenty of water. Additionally, she says she drinks extra water because she is thirsty all the time.  2. Presyncopal spells: She has had multiple spells. They have happened at any time of the day. She had 2 yesterday, one in the mid afternoon and one in the evening. They'll start the same way. They do not start when she is sitting or lying down. They only start when she is standing. Sometimes, she will get up and walk about 20 feet and feels something coming on. She does not have palpitations with these. She will have enough of a prodrome that she can get to a place where she can slide down the wall to keep from falling but she cannot remain standing. She wonders if she is having seizure activity because there is  jerking of her arms and legs. She has not fallen to get injured with this. She has had enough warning that she is able to get herself into a position where she is not getting her. There may be an orthostatic component but this is not clear. The symptoms up and going on for about 2 years. She thinks this is why her Lasix was decreased from a total of 120 mg daily down to 40 mg daily.  3. Back pain: She has had pain under her left scapula when she gets very short of breath. This does not remind her of the pain prior to when she needed stenting.   Past Medical History  Diagnosis Date  . Syncope and collapse     CAROTID DOPPLER, 08/21/2009 - no significant stenosis demonstrated  . LBBB (left bundle branch block)   . Syncope     2D ECHO, 08/21/2009 - EF 45-50%, normal (but later EFs abnormal)  . OSA (obstructive sleep apnea)     AHI-9.77/hr, during REM-50.32/hr  . Coronary artery disease     a. remote RCA stenting in 2008 with non-DES. b. Cath 06/2014 following abnormal nuc: Stable, unchanged from prior cath, patent stent and 40% LM.  Marland Kitchen Hypertension   . Hyperlipidemia   . Diabetes mellitus ([place])   . Tobacco abuse   . CKD (chronic kidney disease), stage III   .  Chronic pain     a. Prior h/o chronic pain on methadone.  . Nonischemic cardiomyopathy ([place])   . Biventricular ICD (implantable cardioverter-defibrillator) in place   . Complication of anesthesia     hard time waking her up from general surgery  . AICD (automatic cardioverter/defibrillator) present     [employer] [id number] ICD serial [id number]  . Chronic systolic CHF (congestive heart failure) (HCC)     a. mixed ischemic/non-ischemic cardiomyopathy. Varying EF over the years but most recently 20%, s/p CRT-D in 06/2014.  Marland Kitchen Headache   . Arthritis   . Anemia     as teenager  . GERD (gastroesophageal reflux disease)   . Anxiety     Past Surgical History  Procedure Laterality Date  . Cardiac catheterization  03/31/2010     Moderate-noncritical CAD  . Cardiac catheterization  05/04/2007    Recommended medical therapy  . Cardiac catheterization  12/14/2006    Recommended stenting of RCA  . Cardiac catheterization  12/14/2006    RCA stented with a 3.0 [employer] Liberte stent resulting in a reduction of 75% to 0% residual  . Cardiac catheterization  05/14/2004    Recommending medical therapy  . Cardiac catheterization  08/26/1999    No intervention  . Left heart catheterization with coronary angiogram N/A 12/27/2012    Procedure: LEFT HEART CATHETERIZATION WITH CORONARY ANGIOGRAM;  Surgeon: Lorretta Harp, MD;  Location: [facility] CATH LAB;  Service: Cardiovascular;  Laterality: N/A;  . Left and right heart catheterization with coronary angiogram N/A 05/29/2014    Procedure: LEFT AND RIGHT HEART CATHETERIZATION WITH CORONARY ANGIOGRAM;  Surgeon: Lorretta Harp, MD;  Location: [facility] CATH LAB;  Service: Cardiovascular;  Laterality: N/A;  . Bi-ventricular implantable cardioverter defibrillator N/A 06/11/2014    Procedure: BI-VENTRICULAR IMPLANTABLE CARDIOVERTER DEFIBRILLATOR  (CRT-D);  Surgeon: Deboraha Sprang, MD; [facility] [phone] ICD serial [phone]   . Cholecystectomy      20 years ago  . Eye surgery      bilateral cataract surgery   . Back surgery  2013  . Colonoscopy with propofol N/A 12/15/2014    Procedure: COLONOSCOPY WITH PROPOFOL;  Surgeon: Clarene Essex, MD;  Location: [facility] ENDOSCOPY;  Service: Endoscopy;  Laterality: N/A;    Current Outpatient Prescriptions  Medication Sig Dispense Refill  . ALPRAZolam (XANAX) 1 MG tablet Take 1 mg by mouth 4 (four) times daily.     Marland Kitchen aspirin EC 81 MG tablet Take 81 mg by mouth at bedtime.    . carvedilol (COREG) 12.5 MG tablet TAKE 1 TABLET TWICE DAILY WITH MEALS 180 tablet 2  . citalopram (CELEXA) 40 MG tablet Take 40 mg by mouth every morning.     . clopidogrel (PLAVIX) 75 MG tablet TAKE 1 TABLET (75 MG TOTAL) BY MOUTH DAILY. 90 tablet 2  . furosemide (LASIX) 40 MG tablet  Take 40 mg by mouth every morning.     Marland Kitchen glipiZIDE (GLUCOTROL) 10 MG tablet Take 10 mg by mouth 2 (two) times daily.    Marland Kitchen HYDROcodone-acetaminophen (NORCO/VICODIN) 5-325 MG per tablet Take 1 tablet by mouth every 6 (six) hours as needed for moderate pain.    . isosorbide mononitrate (IMDUR) 30 MG 24 hr tablet Take 1 tablet (30 mg total) by mouth daily. 30 tablet 3  . levothyroxine (SYNTHROID, LEVOTHROID) 112 MCG tablet Take 112 mcg by mouth at bedtime.     Marland Kitchen lisinopril (PRINIVIL,ZESTRIL) 2.5 MG tablet TAKE 1 TABLET BY MOUTH DAILY 30 tablet 8  .  nitroGLYCERIN (NITROSTAT) 0.4 MG SL tablet Place 1 tablet (0.4 mg total) under the tongue every 5 (five) minutes as needed for chest pain. 25 tablet 3  . nortriptyline (PAMELOR) 50 MG capsule Take 50 mg by mouth at bedtime.     . pantoprazole (PROTONIX) 40 MG tablet Take 40 mg by mouth 2 (two) times daily.     . potassium chloride (K-DUR) 10 MEQ tablet Take 10 mEq by mouth 2 (two) times daily.     . simvastatin (ZOCOR) 20 MG tablet Take 20 mg by mouth daily with supper.     . sitaGLIPtin (JANUVIA) 50 MG tablet Take 50 mg by mouth daily with supper.     No current facility-administered medications for this visit.    Allergies:   Valium    Social History:  The patient  reports that she has been smoking E-cigarettes.  She started smoking about 62 years ago. She has a 58 pack-year smoking history. She has never used smokeless tobacco. She reports that she does not drink alcohol or use illicit drugs.   Family History:  The patient's family history includes Coronary artery disease in her father; Healthy in her sister and sister; Heart disease in her brother; Hypertension in her mother; Other in her brother; Stroke in her brother and mother.    ROS:  Please see the history of present illness. All other systems are reviewed and negative.    PHYSICAL EXAM: VS:  BP 132/66 mmHg  Pulse 96  Ht 5\' 6"  (1.676 m)  Wt 209 lb (94.802 kg)  BMI 33.75 kg/m2 , BMI  Body mass index is 33.75 kg/(m^2). GEN: Well nourished, well developed, female in no acute distress HEENT: normal for age  Neck: Minimal JVD, no carotid bruit, no masses; mildly positive hepatojugular reflux Cardiac: RRR; no murmur, no rubs, or gallops Respiratory:  Few rales bases bilaterally, normal work of breathing GI: soft, nontender, nondistended, + BS MS: no deformity or atrophy; 1-2 plus lower extremity edema, right greater than left; distal pulses are 2+ in all 4 extremities  Skin: warm and dry, no rash Neuro:  Strength and sensation are intact Psych: euthymic mood, full affect   EKG:  EKG is ordered today.  Cath: 05/2014 1. Left main; 40% smooth distal  2. LAD; Free of significant disease 3. Left circumflex; nondominant and free of significant disease.  4. Right coronary artery; dominant with a patent stent and a most 40% in-stent restenosis 5. Left ventriculography; RAO left ventriculogram was performed using  25 mL of Visipaque dye at 12 mL/second. The overall LVEF estimated  20-25 % With wall motion abnormalities an severe global hypokinesia  Recent Labs: 05/27/2014: TSH 0.516 06/11/2014: Hemoglobin 11.1*; Platelets 255 07/30/2014: Pro B Natriuretic peptide (BNP) 82.0 10/06/2014: BUN 25*; Creatinine, Ser 1.54*; Potassium 3.8; Sodium 136    Lipid Panel    Component Value Date/Time   CHOL  08/21/2009 0100    141        ATP III CLASSIFICATION:  <200     mg/dL   Desirable  200-239  mg/dL   Borderline High  >=240    mg/dL   High          TRIG 127 08/21/2009 0100   HDL 48 08/21/2009 0100   CHOLHDL 2.9 08/21/2009 0100   VLDL 25 08/21/2009 0100   LDLCALC  08/21/2009 0100    68        Total Cholesterol/HDL:CHD Risk Coronary Heart Disease Risk Table  Men   Women  1/2 Average Risk   3.4   3.3  Average Risk       5.0   4.4  2 X Average Risk   9.6   7.1  3 X Average Risk  23.4   11.0        Use the calculated Patient Ratio above and the CHD  Risk Table to determine the patient's CHD Risk.        ATP III CLASSIFICATION (LDL):  <100     mg/dL   Optimal  100-129  mg/dL   Near or Above                    Optimal  130-159  mg/dL   Borderline  160-189  mg/dL   High  >190     mg/dL   Very High     Wt Readings from Last 3 Encounters:  02/11/15 209 lb (94.802 kg)  12/15/14 201 lb (91.173 kg)  10/06/14 199 lb (90.266 kg)     Other studies Reviewed: Additional studies/ records that were reviewed today include: Office visits, Notes and hospital notes.  ASSESSMENT AND PLAN:  1.  Edema: We will increase her Lasix to 40 mg twice a day. We will make sure she is taking her potassium twice a day as she was previously only taking it once a day. We will ask her to drink no more than 2 L of fluid daily. She is also to start tracking her sodium more carefully we have asked her to limit 2000 mg daily. She was cautioned about hidden sodium and foods and is encouraged to look for this. She will get a nurse visit with the BMET in one week to follow her progress and she is to contact us by phone in the meantime if she has any problems.  2. Presyncope: Her device has been interrogated, we will follow-up on the results. If no arrhythmia is noted to return. Time when the episodes happened yesterday, then we will check orthostatic vital signs when she comes back in for her RN visit. If the orthostatic vital signs are negative, she is to follow-up with her primary care physician and consider neurologic referral.  3. Back pain: The pain was with exertion, but she was working very hard to breathe which may be related to volume overload. She is to continue to follow herself her symptoms as these symptoms were unlike what she had when she needed a stent. If she continues to have symptoms once her volume status improves, we can consider stress testing.  4. Tobacco use: She quit smoking cigarettes in October 2015. However she is continuing to do very being. Will  discuss with M.D. if this is acceptable for her to continue.   Current medicines are reviewed at length with the patient today.  The patient does not have concerns regarding medicines.  The following changes have been made:  Increase Lasix and potassium  Labs/ tests ordered today include:   Orders Placed This Encounter  Procedures  . Basic metabolic panel   Disposition:   FU with Dr Gwenlyn Found  Signed, Lenoard Aden  02/11/2015 5:05 PM    [facility] [place], [place], [place]  [address] Phone: [phone]; Fax: [phone]

## 2015-02-13 ENCOUNTER — Ambulatory Visit (INDEPENDENT_AMBULATORY_CARE_PROVIDER_SITE_OTHER): Payer: Medicare FFS

## 2015-02-13 DIAGNOSIS — I5023 Acute on chronic systolic (congestive) heart failure: Secondary | ICD-10-CM

## 2015-02-13 DIAGNOSIS — Z95810 Presence of automatic (implantable) cardiac defibrillator: Secondary | ICD-10-CM

## 2015-02-13 NOTE — Progress Notes (Signed)
EPIC Encounter for ICM Monitoring  Patient Name: [name] is a 74 y.o. female Date: 02/13/2015 Primary Care Physican: Nicola Girt, DO Primary Cardiologist: Gwenlyn Found Electrophysiologist: Caryl Comes Dry Weight: 207 lb       In the past month, have you:  1. Gained more than 2 pounds in a day or more than 5 pounds in a week? Yes between 5-8 lbs in last 3-4 weeks.  She stated weight has decreased some but still thinks she has about 5 extra pounds.   2. Had changes in your medications (with verification of current medications)? Lasix 40 mg bid and potassium bid  3. Had more shortness of breath than is usual for you? no  4. Limited your activity because of shortness of breath? no  5. Not been able to sleep because of shortness of breath? no  6. Had increased swelling in your feet or ankles? no  7. Had symptoms of dehydration (dizziness, dry mouth, increased thirst, decreased urine output) no  8. Had changes in sodium restriction? no  9. Been compliant with medication? Yes   ICM trend: 02/13/2015   Follow-up plan: ICM clinic phone appointment on 02/18/2015.  Corvue impedance below baseline 01/26/2015 to 01/28/2015 and 02/12/2015 suggesting fluid retention.  Had advised member to make an appointment with Dr Gwenlyn Found at last ICM call on 01/14/2015 and she declined due to financial reasons.  Patient had edema and increased weight and was seen by Rosaria Ferries PA-C on 02/11/2015.  She was instructed to limit fluids to 64 oz daily and sodium 2000 mg daily.  She should be scheduling nurse visit a week from the appointment and BMET ordered.  Patient stated she is feeling better, leg swelling has improved.  Reiterated the sodium and fluid restrictions.  She stated she understands.  She stated she is taking meds as ordered.  No changes today.    Copy of note sent to patient's primary care physician, primary cardiologist, and device following physician.  [name], RN, CCM 02/13/2015 3:59  PM

## 2015-02-18 ENCOUNTER — Ambulatory Visit (INDEPENDENT_AMBULATORY_CARE_PROVIDER_SITE_OTHER): Payer: Medicare FFS

## 2015-02-18 DIAGNOSIS — I5023 Acute on chronic systolic (congestive) heart failure: Secondary | ICD-10-CM

## 2015-02-18 DIAGNOSIS — Z95810 Presence of automatic (implantable) cardiac defibrillator: Secondary | ICD-10-CM

## 2015-02-19 ENCOUNTER — Ambulatory Visit: Payer: Medicare FFS | Admitting: Pharmacist Clinician (PhC)/ Clinical Pharmacy Specialist

## 2015-02-19 NOTE — Progress Notes (Signed)
EPIC Encounter for ICM Monitoring  Patient Name: [name] is a 74 y.o. female Date: 02/19/2015 Primary Care Physican: Nicola Girt, DO Primary Cardiologist: Gwenlyn Found Electrophysiologist: Caryl Comes Dry Weight: 202 lbs  Bi-V Pacing 97%       In the past month, have you:  1. Gained more than 2 pounds in a day or more than 5 pounds in a week? no  2. Had changes in your medications (with verification of current medications)? no  3. Had more shortness of breath than is usual for you? no  4. Limited your activity because of shortness of breath? no  5. Not been able to sleep because of shortness of breath? no  6. Had increased swelling in your feet or ankles? no  7. Had symptoms of dehydration (dizziness, dry mouth, increased thirst, decreased urine output) no  8. Had changes in sodium restriction? no  9. Been compliant with medication? Yes   ICM trend: 02/18/2015   Follow-up plan: ICM clinic phone appointment on 03/24/2015.  Corvue daily impedance below baseline 02/12/2015 to 02/14/2015 suggesting fluid retention.  She stated she has not had any HF symptoms and has had weight loss.  She stated she thinks she does have a cold or flu today.  She has follow up appointment at [facility] on 02/26/2015.  She stated she has not went to get her labs done but plans on doing that son.  No changes today.    Copy of note sent to patient's primary care physician, primary cardiologist, and device following physician.  [name], RN, CCM 02/19/2015 12:59 PM

## 2015-02-26 ENCOUNTER — Other Ambulatory Visit: Payer: Self-pay | Admitting: Cardiology

## 2015-02-26 ENCOUNTER — Ambulatory Visit (INDEPENDENT_AMBULATORY_CARE_PROVIDER_SITE_OTHER): Payer: Medicare FFS | Admitting: Pharmacist Clinician (PhC)/ Clinical Pharmacy Specialist

## 2015-02-26 VITALS — BP 122/72 | Ht 66.0 in | Wt 204.8 lb

## 2015-02-26 DIAGNOSIS — R600 Localized edema: Secondary | ICD-10-CM

## 2015-02-26 NOTE — Telephone Encounter (Signed)
Rx(s) sent to pharmacy electronically.  

## 2015-02-27 ENCOUNTER — Encounter: Payer: Self-pay | Admitting: Pharmacist Clinician (PhC)/ Clinical Pharmacy Specialist

## 2015-02-27 LAB — BASIC METABOLIC PANEL
BUN: 18 mg/dL (ref 7–25)
CO2: 28 mmol/L (ref 20–31)
Calcium: 9.4 mg/dL (ref 8.6–10.4)
Chloride: 100 mmol/L (ref 98–110)
Creat: 1.38 mg/dL — ABNORMAL HIGH (ref 0.60–0.93)
Glucose, Bld: 243 mg/dL — ABNORMAL HIGH (ref 65–99)
Potassium: 3.9 mmol/L (ref 3.5–5.3)
Sodium: 139 mmol/L (ref 135–146)

## 2015-02-27 NOTE — Progress Notes (Signed)
Patient was requested to return for BMET today, was put onto my schedule in error.  Reviewed medications with patient.  She states trying to drink less water (was > 1 gallon per day), but still probably more than the 2 liters that was suggested to her.  Her weight is down 4 pounds today.  She reports edema improved, but "changes from day to day".  Will have patient go to lab for BMET.  Will not charge for OV today.

## 2015-03-06 ENCOUNTER — Telehealth: Payer: Self-pay | Admitting: Cardiovascular Disease

## 2015-03-06 NOTE — Telephone Encounter (Signed)
Unable to leave message on either phone number as VM is full.

## 2015-03-06 NOTE — Telephone Encounter (Signed)
Pt would like her lab results from last week please. If not at [facility] [phone].

## 2015-03-10 DIAGNOSIS — R059 Cough, unspecified: Secondary | ICD-10-CM | POA: Insufficient documentation

## 2015-03-10 DIAGNOSIS — R05 Cough: Secondary | ICD-10-CM | POA: Insufficient documentation

## 2015-03-10 NOTE — Telephone Encounter (Signed)
Returned call to patient.Patient not at home.Left message with husband to return call.

## 2015-03-10 NOTE — Telephone Encounter (Signed)
Returning your call. °

## 2015-03-10 NOTE — Telephone Encounter (Signed)
Returned call to patient advised 02/26/15 Bmet ok except elevated glucose.Advised labs are waiting review from [facility] PA.

## 2015-03-11 ENCOUNTER — Telehealth: Payer: Self-pay | Admitting: Cardiology

## 2015-03-11 NOTE — Telephone Encounter (Signed)
Spoke w/ pt and requested that she send a remote transmission b/c her home monitor has not updated in at least 8 days. Pt verbalized understanding.

## 2015-03-15 NOTE — Progress Notes (Signed)
This encounter was created in error - please disregard.

## 2015-03-16 ENCOUNTER — Encounter: Payer: Medicare FFS | Admitting: Physician Assistant

## 2015-03-17 ENCOUNTER — Ambulatory Visit: Payer: Medicare FFS | Admitting: Physician Assistant

## 2015-03-19 ENCOUNTER — Encounter: Payer: Self-pay | Admitting: *Deleted

## 2015-03-24 ENCOUNTER — Telehealth: Payer: Self-pay | Admitting: Cardiology

## 2015-03-24 NOTE — Telephone Encounter (Signed)
Spoke with pt and reminded pt of remote transmission that is due today. Pt verbalized understanding.   

## 2015-03-27 NOTE — Progress Notes (Signed)
No remote ICM transmission sent for 03/24/2015 after reminder call.  She has office appointment with Chanetta Marshall, NP on 04/09/2015.   Next transmission scheduled for 05/19/2015 and letter sent with new date.

## 2015-04-09 ENCOUNTER — Ambulatory Visit: Payer: Medicare FFS | Admitting: Nurse Practitioner

## 2015-04-10 DIAGNOSIS — K2100 Gastro-esophageal reflux disease with esophagitis, without bleeding: Secondary | ICD-10-CM | POA: Insufficient documentation

## 2015-04-10 DIAGNOSIS — N183 Chronic kidney disease, stage 3 unspecified: Secondary | ICD-10-CM | POA: Insufficient documentation

## 2015-04-10 DIAGNOSIS — E559 Vitamin D deficiency, unspecified: Secondary | ICD-10-CM | POA: Insufficient documentation

## 2015-04-15 ENCOUNTER — Other Ambulatory Visit: Payer: Self-pay | Admitting: Cardiovascular Disease

## 2015-04-15 DIAGNOSIS — I739 Peripheral vascular disease, unspecified: Secondary | ICD-10-CM

## 2015-04-22 ENCOUNTER — Ambulatory Visit (HOSPITAL_COMMUNITY)
Admission: RE | Admit: 2015-04-22 | Discharge: 2015-04-22 | Disposition: A | Discharge status: Home / Self Care | Payer: Medicare FFS | Source: Ambulatory Visit | Attending: Cardiovascular Disease | Admitting: Cardiovascular Disease

## 2015-04-22 ENCOUNTER — Ambulatory Visit (HOSPITAL_COMMUNITY): Admission: RE | Admit: 2015-04-22 | Payer: Medicare FFS | Source: Ambulatory Visit

## 2015-04-22 DIAGNOSIS — J449 Chronic obstructive pulmonary disease, unspecified: Secondary | ICD-10-CM | POA: Insufficient documentation

## 2015-04-22 DIAGNOSIS — E785 Hyperlipidemia, unspecified: Secondary | ICD-10-CM | POA: Diagnosis not present

## 2015-04-22 DIAGNOSIS — R938 Abnormal findings on diagnostic imaging of other specified body structures: Secondary | ICD-10-CM | POA: Insufficient documentation

## 2015-04-22 DIAGNOSIS — I130 Hypertensive heart and chronic kidney disease with heart failure and stage 1 through stage 4 chronic kidney disease, or unspecified chronic kidney disease: Secondary | ICD-10-CM | POA: Diagnosis not present

## 2015-04-22 DIAGNOSIS — I5022 Chronic systolic (congestive) heart failure: Secondary | ICD-10-CM | POA: Insufficient documentation

## 2015-04-22 DIAGNOSIS — I739 Peripheral vascular disease, unspecified: Secondary | ICD-10-CM

## 2015-04-22 DIAGNOSIS — N183 Chronic kidney disease, stage 3 (moderate): Secondary | ICD-10-CM | POA: Insufficient documentation

## 2015-04-22 DIAGNOSIS — Z87891 Personal history of nicotine dependence: Secondary | ICD-10-CM | POA: Insufficient documentation

## 2015-04-22 DIAGNOSIS — E1322 Other specified diabetes mellitus with diabetic chronic kidney disease: Secondary | ICD-10-CM | POA: Insufficient documentation

## 2015-05-06 ENCOUNTER — Encounter: Payer: Medicare FFS | Admitting: Nurse Practitioner

## 2015-05-07 ENCOUNTER — Other Ambulatory Visit: Payer: Self-pay | Admitting: Cardiovascular Disease

## 2015-05-07 NOTE — Telephone Encounter (Signed)
Rx request sent to pharmacy.  

## 2015-05-19 ENCOUNTER — Telehealth: Payer: Self-pay | Admitting: Cardiology

## 2015-05-19 ENCOUNTER — Ambulatory Visit (INDEPENDENT_AMBULATORY_CARE_PROVIDER_SITE_OTHER): Payer: Medicare FFS

## 2015-05-19 DIAGNOSIS — I5023 Acute on chronic systolic (congestive) heart failure: Secondary | ICD-10-CM

## 2015-05-19 DIAGNOSIS — Z95810 Presence of automatic (implantable) cardiac defibrillator: Secondary | ICD-10-CM

## 2015-05-19 NOTE — Telephone Encounter (Signed)
Attempted to confirm remote transmission with pt. No answer and was unable to leave a message.   

## 2015-05-19 NOTE — Telephone Encounter (Signed)
Spoke w/ pt and instructed her that we still have not received her remote transmission. instructed pt how to send a manual transmission. Pt verbalized understanding.

## 2015-05-19 NOTE — Telephone Encounter (Signed)
New Message ° °4. Are you calling to see if we received your device transmission? Yes  ° °

## 2015-05-20 NOTE — Progress Notes (Signed)
  EPIC Encounter for ICM Monitoring  Patient Name: [name] is a 75 y.o. female Date: 05/20/2015 Primary Care Physican: Nicola Girt, DO Primary Cardiologist: Gwenlyn Found Electrophysiologist: Caryl Comes Dry Weight: 205.8 lbs   Bi-V Pacing 98%      In the past month, have you:  1. Gained more than 2 pounds in a day or more than 5 pounds in a week? Yes, 3.8 pounds in last week.  Baseline weight for her is 202 lbs  2. Had changes in your medications (with verification of current medications)? no  3. Had more shortness of breath than is usual for you? no  4. Limited your activity because of shortness of breath? no  5. Not been able to sleep because of shortness of breath? no  6. Had increased swelling in your feet or ankles? No, but swelling in hands  7. Had symptoms of dehydration (dizziness, dry mouth, increased thirst, decreased urine output) no  8. Had changes in sodium restriction? no  9. Been compliant with medication? Yes   ICM trend: 3 month view for 05/19/2015   ICM trend: 1 year view for 05/19/2015   Follow-up plan: ICM clinic phone appointment 05/27/2015.  Thoracic impedance below reference line from 05/16/2015 to 05/18/2012 (transmission date) suggesting fluid accumulation.  Patient has symptoms of weight gain and hand swelling in last week.  She reported shortness of breath with activities but unsure if this is worse in the last week.     LABS: 02/26/2015 Creatinine 1.38, BUN 18, Potassium 3.9 10/06/2014 Creatinine 1.54, BUN 25, Potassium 3.8 06/12/2014 Creatinine 1.35, BUN 19, Potassium 3.9  Advised would send to Dr Gwenlyn Found and Dr Caryl Comes for review and recommendations regarding Lasix, Potassium, symptoms and thoracic impedance.  Advised will call her back.    Copy of note sent to patient's primary care physician, primary cardiologist, and device following physician.  [name], RN, CCM 05/20/2015 11:27 AM        Message from Dr Gwenlyn Found ordered patient should double  Furosemide and Potassium dosages x 3 days and make an appointment for patient to see NP or PA early next week.     Call to patient and advised of Dr Kennon Holter orders to increase Furosemide to 80 mg bid and Potassium 10 meq one tablet bid x 3 days and then resume previous prescribed dosages.   Advised a scheduler will contact her to set up office appointment.   She verbalized understanding and read back instructions.  Advised to call if she have worsening fluid symptoms.  Will repeat transmission on 05/27/2015.

## 2015-05-21 NOTE — Progress Notes (Signed)
ICM remote transmission changed from 05/27/2015 to 05/26/2015 due to patient has an office appointment on 05/26/2015.  Transmission can be reviewed at time of office visit.

## 2015-05-25 NOTE — Progress Notes (Signed)
Cardiology Office Note:    Date:  05/26/2015   ID:  [name], DOB [birth year], MRN [id number]  PCP:  Nicola Girt, DO  Cardiologist:  Dr. Quay Burow   Electrophysiologist:  Dr. Virl Axe   Chief Complaint  Patient presents with  . Congestive Heart Failure    History of Present Illness:     [name] is a 75 y.o. female with a hx of CAD s/p prior stent to the RCA, HTN, HL, DM2, LBBB, prior tobacco abuse.  She was noted to have worsening symptoms of DOE last year and her EF was reduced at 20-25%.  LHC demonstrated patent RCA stent and non-obs disease elsewhere.  She then underwent CRT-D implantation with Dr. Virl Axe in 4/16.  She is followed in the [facility] for monitoring of her CHF. Last seen in clinic with Rosaria Ferries, PA-C in 12/16.    Her thoracic impedance was noted to go down recently and her diuretics were adjusted.  She was added on today for FU. Of note, her most recent device check indicates that her thoracic impedance has gone down again in the last few days. It had previously improved with the higher doses of Lasix.   She has chronic DOE.  It has been worse over the last several weeks.  Overall, she is NYHA 2b-3.  She denies PND, orthopnea, significant edema.  Her weight is up 3 lbs at home.  Her ICD site has been sore since implant without any changes.  No redness or fever.  She has some lower L rib pain.  This is positional.  She is fatigued.  She has significant orthostatic intolerance.  She has had episodes of syncope after standing.  She broke her foot in 8/16.  She had a Head CT with her PCP last month that was normal.  She does admit to a diet high in salt.     Past Medical History  Diagnosis Date  . Syncope and collapse     CAROTID DOPPLER, 08/21/2009 - no significant stenosis demonstrated  . LBBB (left bundle branch block)   . Syncope     2D ECHO, 08/21/2009 - EF 45-50%, normal (but later EFs abnormal)  . OSA (obstructive sleep apnea)    AHI-9.77/hr, during REM-50.32/hr  . Coronary artery disease     a. remote RCA stenting in 2008 with non-DES. b. Cath 06/2014 following abnormal nuc: Stable, unchanged from prior cath, patent stent and 40% LM.  Marland Kitchen Hypertension   . Hyperlipidemia   . Diabetes mellitus ([place])   . Tobacco abuse   . CKD (chronic kidney disease), stage III   . Chronic pain     a. Prior h/o chronic pain on methadone.  . Nonischemic cardiomyopathy ([place])   . Biventricular ICD (implantable cardioverter-defibrillator) in place   . Complication of anesthesia     hard time waking her up from general surgery  . AICD (automatic cardioverter/defibrillator) present     [place] [id number] ICD serial [phone]  . Chronic systolic CHF (congestive heart failure) (HCC)     a. mixed ischemic/non-ischemic cardiomyopathy. Varying EF over the years but most recently 20%, s/p CRT-D in 06/2014.  Marland Kitchen Headache   . Arthritis   . Anemia     as teenager  . GERD (gastroesophageal reflux disease)   . Anxiety     Past Surgical History  Procedure Laterality Date  . Cardiac catheterization  03/31/2010    Moderate-noncritical CAD  . Cardiac catheterization  05/04/2007    Recommended medical therapy  . Cardiac catheterization  12/14/2006    Recommended stenting of RCA  . Cardiac catheterization  12/14/2006    RCA stented with a 3.0 [employer] Liberte stent resulting in a reduction of 75% to 0% residual  . Cardiac catheterization  05/14/2004    Recommending medical therapy  . Cardiac catheterization  08/26/1999    No intervention  . Left heart catheterization with coronary angiogram N/A 12/27/2012    Procedure: LEFT HEART CATHETERIZATION WITH CORONARY ANGIOGRAM;  Surgeon: Lorretta Harp, MD;  Location: [facility] CATH LAB;  Service: Cardiovascular;  Laterality: N/A;  . Left and right heart catheterization with coronary angiogram N/A 05/29/2014    Procedure: LEFT AND RIGHT HEART CATHETERIZATION WITH CORONARY ANGIOGRAM;  Surgeon: Lorretta Harp,  MD;  Location: [facility] CATH LAB;  Service: Cardiovascular;  Laterality: N/A;  . Bi-ventricular implantable cardioverter defibrillator N/A 06/11/2014    Procedure: BI-VENTRICULAR IMPLANTABLE CARDIOVERTER DEFIBRILLATOR  (CRT-D);  Surgeon: Deboraha Sprang, MD; [facility] [phone] ICD serial [phone]   . Cholecystectomy      20 years ago  . Eye surgery      bilateral cataract surgery   . Back surgery  2013  . Colonoscopy with propofol N/A 12/15/2014    Procedure: COLONOSCOPY WITH PROPOFOL;  Surgeon: Clarene Essex, MD;  Location: [facility] ENDOSCOPY;  Service: Endoscopy;  Laterality: N/A;    Current Medications: Outpatient Prescriptions Prior to Visit  Medication Sig Dispense Refill  . ALPRAZolam (XANAX) 1 MG tablet Take 1 mg by mouth 4 (four) times daily.     Marland Kitchen aspirin EC 81 MG tablet Take 81 mg by mouth at bedtime.    . citalopram (CELEXA) 40 MG tablet Take 40 mg by mouth every morning.     . clopidogrel (PLAVIX) 75 MG tablet TAKE 1 TABLET EVERY DAY 90 tablet 1  . glipiZIDE (GLUCOTROL) 10 MG tablet Take 10 mg by mouth 2 (two) times daily.    Marland Kitchen HYDROcodone-acetaminophen (NORCO/VICODIN) 5-325 MG per tablet Take 1 tablet by mouth every 6 (six) hours as needed for moderate pain.    Marland Kitchen levothyroxine (SYNTHROID, LEVOTHROID) 112 MCG tablet Take 112 mcg by mouth at bedtime.     Marland Kitchen lisinopril (PRINIVIL,ZESTRIL) 2.5 MG tablet TAKE 1 TABLET BY MOUTH DAILY 30 tablet 8  . nitroGLYCERIN (NITROSTAT) 0.4 MG SL tablet Place 1 tablet (0.4 mg total) under the tongue every 5 (five) minutes as needed for chest pain. 25 tablet 3  . nortriptyline (PAMELOR) 50 MG capsule Take 50 mg by mouth at bedtime.     . pantoprazole (PROTONIX) 40 MG tablet Take 40 mg by mouth 2 (two) times daily.     . simvastatin (ZOCOR) 20 MG tablet Take 20 mg by mouth daily with supper.     . sitaGLIPtin (JANUVIA) 50 MG tablet Take 50 mg by mouth daily with supper.    . carvedilol (COREG) 12.5 MG tablet TAKE 1 TABLET TWICE DAILY WITH MEALS 180 tablet 2  .  furosemide (LASIX) 40 MG tablet Take 1 tablet (40 mg total) by mouth 2 (two) times daily. 60 tablet 6  . isosorbide mononitrate (IMDUR) 30 MG 24 hr tablet TAKE 1 TABLET EVERY DAY 90 tablet 3  . potassium chloride (K-DUR) 10 MEQ tablet Take 10 mEq by mouth daily.      No facility-administered medications prior to visit.     Allergies:   Valium   Social History   Social History  . Marital Status:  Married    Spouse Name: N/A  . Number of Children: N/A  . Years of Education: N/A   Social History Main Topics  . Smoking status: Former Smoker -- 1.00 packs/day for 58 years    Types: E-cigarettes    Start date: 12/26/1952    Quit date: 12/05/2013  . Smokeless tobacco: Never Used     Comment: uses Vape cigarettes  . Alcohol Use: No  . Drug Use: No  . Sexual Activity: Not Asked   Other Topics Concern  . None   Social History Narrative     Family History:  The patient's family history includes Coronary artery disease in her father; Healthy in her sister and sister; Heart disease in her brother; Hypertension in her mother; Other in her brother; Stroke in her brother and mother.   ROS:   Please see the history of present illness.    Review of Systems  Constitution: Positive for weight gain.  HENT: Positive for headaches.   Cardiovascular: Positive for chest pain and syncope.  Respiratory: Positive for cough, shortness of breath and wheezing.   Musculoskeletal: Positive for back pain.  Neurological: Positive for loss of balance.  Psychiatric/Behavioral: The patient is nervous/anxious.   All other systems reviewed and are negative.   Physical Exam:    VS:  BP 124/70 mmHg  Pulse 110  Ht 5\' 6"  (1.676 m)  Wt 203 lb 9.6 oz (92.352 kg)  BMI 32.88 kg/m2  SpO2 96%    Orthostatic VS for the past 24 hrs:  BP- Lying Pulse- Lying BP- Sitting Pulse- Sitting BP- Standing at 0 minutes Pulse- Standing at 0 minutes  05/26/15 1621 117/72 mmHg 100 105/67 mmHg 98 116/69 mmHg 101   GEN:  Well nourished, well developed, in no acute distress HEENT: normal Neck: no JVD, no masses Cardiac: Normal S1/S2,  RRR; no murmurs, rubs, or gallops, trace bilateral LE edema    Respiratory:  clear to auscultation bilaterally; no wheezing, rhonchi or rales GI: soft, nontender, nondistended MS: no deformity or atrophy Skin: warm and dry Neuro: No focal deficits  Psych: Alert and oriented x 3, normal affect  Wt Readings from Last 3 Encounters:  05/26/15 203 lb 9.6 oz (92.352 kg)  02/27/15 204 lb 12.8 oz (92.897 kg)  02/11/15 209 lb (94.802 kg)     Studies/Labs Reviewed:     EKG:  EKG is   ordered today.  The ekg ordered today demonstrates BiV paced, HR 102, PRWP, no sig change since prior tracing.   Recent Labs: 05/27/2014: TSH 0.516 06/11/2014: Hemoglobin 11.1*; Platelets 255 07/30/2014: Pro B Natriuretic peptide (BNP) 82.0 02/26/2015: BUN 18; Creat 1.38*; Potassium 3.9; Sodium 139   04/10/15 (at PCP): K 3.9, Cr 1.28   Additional studies/ records that were reviewed today include:   Head CT 04/23/15 IMPRESSION: No acute intracranial abnormality. Negative for age noncontrast CT appearance of the brain.  Echo 8/16 Mild LVH, EF 30-35%, no RWMA  LHC 3/16 1. Left main; 40% smooth distal  2. LAD; Free of significant disease 3. Left circumflex; nondominant and free of significant disease.  4. Right coronary artery; dominant with a patent stent and a most 40% in-stent restenosis 5. Left ventriculography:  LVEF estimated 20-25 % With wall motion abnormalities an severe global hypokinesia  Myoview 3/16 Overall Impression: High risk stress nuclear study with large, severe, fixed defects in the anterior wall, septum and apex (LBBB artifact vs prior infarct); there is also a large, severe, partially reversible inferior defect  consistent with prior infarct and mild peri-infarct ischemia.   ASSESSMENT:     1. Chronic systolic heart failure ([place])   2. Tachycardia   3. Syncope,  unspecified syncope type   4. ICD (implantable cardioverter-defibrillator), biventricular, in situ   5. Coronary artery disease involving native coronary artery of native heart without angina pectoris   6. Essential hypertension   7. Chronic kidney disease (CKD), stage III (moderate)     PLAN:     In order of problems listed above:  1. Chronic systolic CHF - Her device measurements indicate decreasing thoracic impedance consistent with increasing volume. She has minimal symptoms and a 3 pound weight gain at home. She would feel better on higher dose Lasix. She does admit to a diet high in salt.  -  Decrease salt intake  -  Increase Lasix to 80 mg in the a.m. and 40 mg in the p.m.  -  Increase potassium to 20 mEq daily  -  BMET today and repeat in one week  -  Repeat ICM monitoring in one week    -  Continue ACE inhibitor  -  FU with Dr. Quay Burow or me in 4 weeks (she wants to return on day she sees Dr. Ellene Route - so she may have to see one of the other PA/NPs).     2. Tachycardia - She has baseline high HRs.  She also has some hx of orthostatic intol.  Her ortho VS today are normal.  She does not have a sig BP drop or HR increase.  It would be better for her to have a slower HR given her DCM.  I reviewed her case today with Dr. Virl Axe.  We considered Corlanor vs increasing Carvedilol.  As her BP did not drop with standing, I will adjust her beta-blocker first.  Corlanor can always be considered in the future.  Will also vasodilator medications as able (imdur).  -  Increase Carvedilol to 18.75 mg bid  -  Consider Corlanor at FU if HR still > 70  -  DC Imdur  -  BMET, CBC today  3. Syncope - This seems to be related to transient orthostatic intolerance.  I have asked her to stand slowly and continue to use caution with standing.  As noted, her BP does not drop in the office today.  She has not had any other symptoms to suggest cardiac syncope.   4. S/p CRT-D - FU with EP as  planned.  5. CAD - s/p prior stent to RCA.  LHC in 3/16 with patent stent and non-obs CAD elsewhere.    6. HTN - Controlled.  7. CKD - Monitor renal function closely with increased amounts of diuretics.     Medication Adjustments/Labs and Tests Ordered: Current medicines are reviewed at length with the patient today.  Concerns regarding medicines are outlined above.  Medication changes, Labs and Tests ordered today are outlined in the Patient Instructions noted below. Patient Instructions  Medication Instructions:  1. STOP IMDUR 2. INCREASE COREG TO 18.75 MG TWICE DAILY (THIS WILL BE 1 AND 1/2 TABLETS TWICE DAILY) 3. INCREASE LASIX TO 80 MG IN THE AM AND 40 MG IN THE PM 4. INCREASE POTASSIUM TO 20 MEQ DAILY  Labwork: 1. BMET, CBC W/DIFF TODAY 2. YOU HAVE BEEN GIVEN AN RX FOR BMET TO BE DONE IN 1 WEEK WITH PRIMARY CARE AND THE RESULTS TO BE FAXED TO [place], [place]  Testing/Procedures: NONE  Follow-Up: PLEASE  SCHEDULE AN APPT WITH DR. Gwenlyn Found OR PA/NP FOR 06/25/15  YOU WILL NEED AN APPT TO SEE LORI SHORT IN 1 WEEK FOR A RE-CHECK THORACIC INDEPENDENCE   Any Other Special Instructions Will Be Listed Below (If Applicable).  If you need a refill on your cardiac medications before your next appointment, please call your pharmacy.   Signed, Richardson Dopp, PA-C  05/26/2015 4:54 PM    [facility] [address], [place], [place]  [address] Phone: [phone]; Fax: [phone]

## 2015-05-26 ENCOUNTER — Encounter: Payer: Self-pay | Admitting: Physician Assistant

## 2015-05-26 ENCOUNTER — Ambulatory Visit (INDEPENDENT_AMBULATORY_CARE_PROVIDER_SITE_OTHER): Payer: Medicare FFS | Admitting: Physician Assistant

## 2015-05-26 ENCOUNTER — Ambulatory Visit (INDEPENDENT_AMBULATORY_CARE_PROVIDER_SITE_OTHER): Payer: Medicare FFS

## 2015-05-26 VITALS — BP 124/70 | HR 110 | Ht 66.0 in | Wt 203.6 lb

## 2015-05-26 DIAGNOSIS — R000 Tachycardia, unspecified: Secondary | ICD-10-CM

## 2015-05-26 DIAGNOSIS — Z95810 Presence of automatic (implantable) cardiac defibrillator: Secondary | ICD-10-CM

## 2015-05-26 DIAGNOSIS — I10 Essential (primary) hypertension: Secondary | ICD-10-CM

## 2015-05-26 DIAGNOSIS — N183 Chronic kidney disease, stage 3 unspecified: Secondary | ICD-10-CM

## 2015-05-26 DIAGNOSIS — R55 Syncope and collapse: Secondary | ICD-10-CM

## 2015-05-26 DIAGNOSIS — I2510 Atherosclerotic heart disease of native coronary artery without angina pectoris: Secondary | ICD-10-CM

## 2015-05-26 DIAGNOSIS — I5022 Chronic systolic (congestive) heart failure: Secondary | ICD-10-CM

## 2015-05-26 DIAGNOSIS — I5023 Acute on chronic systolic (congestive) heart failure: Secondary | ICD-10-CM

## 2015-05-26 LAB — CBC WITH DIFFERENTIAL/PLATELET
Basophils Absolute: 0 10*3/uL (ref 0.0–0.1)
Basophils Relative: 0 % (ref 0–1)
Eosinophils Absolute: 0.3 10*3/uL (ref 0.0–0.7)
Eosinophils Relative: 4 % (ref 0–5)
HCT: 36.5 % (ref 36.0–46.0)
Hemoglobin: 12.3 g/dL (ref 12.0–15.0)
Lymphocytes Relative: 16 % (ref 12–46)
Lymphs Abs: 1.4 10*3/uL (ref 0.7–4.0)
MCH: 31.1 pg (ref 26.0–34.0)
MCHC: 33.7 g/dL (ref 30.0–36.0)
MCV: 92.2 fL (ref 78.0–100.0)
MPV: 10.3 fL (ref 8.6–12.4)
Monocytes Absolute: 0.6 10*3/uL (ref 0.1–1.0)
Monocytes Relative: 7 % (ref 3–12)
Neutro Abs: 6.3 10*3/uL (ref 1.7–7.7)
Neutrophils Relative %: 73 % (ref 43–77)
Platelets: 268 10*3/uL (ref 150–400)
RBC: 3.96 MIL/uL (ref 3.87–5.11)
RDW: 14.9 % (ref 11.5–15.5)
WBC: 8.6 10*3/uL (ref 4.0–10.5)

## 2015-05-26 MED ORDER — CARVEDILOL 12.5 MG PO TABS: 18.7500 mg | ORAL_TABLET | Freq: Two times a day (BID) | ORAL | Status: DC

## 2015-05-26 MED ORDER — FUROSEMIDE 40 MG PO TABS
80.0000 mg | ORAL_TABLET | ORAL | Status: DC
Start: 2015-05-26 — End: 2015-05-29

## 2015-05-26 MED ORDER — POTASSIUM CHLORIDE ER 20 MEQ PO TBCR: 20.0000 meq | EXTENDED_RELEASE_TABLET | Freq: Every day | ORAL | Status: DC

## 2015-05-26 NOTE — Patient Instructions (Addendum)
Medication Instructions:  1. STOP IMDUR 2. INCREASE COREG TO 18.75 MG TWICE DAILY (THIS WILL BE 1 AND 1/2 TABLETS TWICE DAILY) 3. INCREASE LASIX TO 80 MG IN THE AM AND 40 MG IN THE PM 4. INCREASE POTASSIUM TO 20 MEQ DAILY  Labwork: 1. BMET, CBC W/DIFF TODAY 2. YOU HAVE BEEN GIVEN AN RX FOR BMET TO BE DONE IN 1 WEEK WITH PRIMARY CARE AND THE RESULTS TO BE FAXED TO [place], [place]  Testing/Procedures: NONE  Follow-Up: PLEASE SCHEDULE AN APPT WITH DR. Gwenlyn Found OR PA/NP FOR 06/25/15  YOU WILL NEED AN APPT TO SEE LORI SHORT IN 1 WEEK FOR A RE-CHECK THORACIC INDEPENDENCE   Any Other Special Instructions Will Be Listed Below (If Applicable).  If you need a refill on your cardiac medications before your next appointment, please call your pharmacy.

## 2015-05-26 NOTE — Progress Notes (Signed)
EPIC Encounter for ICM Monitoring  Patient Name: [name] is a 75 y.o. female Date: 05/26/2015 Primary Care Physican: Nicola Girt, DO Primary Cardiologist: Gwenlyn Found Electrophysiologist: Caryl Comes Dry Weight: 205.8 lb   Bi-V Pacing 98%      In the past month, have you:  1. Gained more than 2 pounds in a day or more than 5 pounds in a week? Patient reported her normal baseline is ~202 lbs.  She reported on 05/20/2015 weight gain of 3.8 lbs and there was no weight loss since that time.   2. Had changes in your medications (with verification of current medications)? no  3. Had more shortness of breath than is usual for you? Yes  4. Limited your activity because of shortness of breath? no  5. Not been able to sleep because of shortness of breath? no  6. Had increased swelling in your feet or ankles? no  7. Had symptoms of dehydration (dizziness, dry mouth, increased thirst, decreased urine output) no  8. Had changes in sodium restriction? no  9. Been compliant with medication? Yes   ICM trend: 3 month view for 05/26/2015   ICM trend: 1 year view for 05/26/2015   Direct Trend Viewer for 05/26/2015    Follow-up plan: ICM clinic phone appointment on 06/09/2015 and an office appointment with Dr Caryl Comes 06/19/2015.  Thoracic impedance above reference line which correlates with increase in Furosemide that was ordered on 05/20/2015.  Patient reported she still thinks she has fluid symptoms due to no weight loss after 3 days of doubling Furosemide dosage on 05/20/2015 and continues to have some shortness of breath.  Patient has a follow up appointment with Richardson Dopp, PA today.         Repeat transmission on 06/09/2015.     Copy of note sent to patient's primary care physician, primary cardiologist, and device following physician.  [name], RN, CCM 05/26/2015 9:56 AM

## 2015-05-26 NOTE — Progress Notes (Signed)
See my OV note from today. Richardson Dopp, PA-C   05/26/2015 5:29 PM

## 2015-05-27 LAB — BASIC METABOLIC PANEL
BUN: 17 mg/dL (ref 7–25)
CO2: 27 mmol/L (ref 20–31)
Calcium: 9.8 mg/dL (ref 8.6–10.4)
Chloride: 97 mmol/L — ABNORMAL LOW (ref 98–110)
Creat: 1.39 mg/dL — ABNORMAL HIGH (ref 0.60–0.93)
Glucose, Bld: 153 mg/dL — ABNORMAL HIGH (ref 65–99)
Potassium: 4.5 mmol/L (ref 3.5–5.3)
Sodium: 140 mmol/L (ref 135–146)

## 2015-05-28 ENCOUNTER — Telehealth: Payer: Self-pay | Admitting: *Deleted

## 2015-05-28 NOTE — Telephone Encounter (Signed)
Pt notified of lab results by phone with verbal understanding.  

## 2015-05-29 ENCOUNTER — Other Ambulatory Visit: Payer: Self-pay | Admitting: *Deleted

## 2015-05-29 DIAGNOSIS — I2510 Atherosclerotic heart disease of native coronary artery without angina pectoris: Secondary | ICD-10-CM

## 2015-05-29 MED ORDER — FUROSEMIDE 40 MG PO TABS: 40.0000 mg | ORAL_TABLET | ORAL | Status: DC

## 2015-05-29 NOTE — Telephone Encounter (Signed)
I s/w [employer] today for clarification for Lasix for pt ; Clarified the directions are 80 mg in the AM (2 tabs) and 40 mg in the PM (1 tab) # 270 x 3. Pharmacists verbalized understanding to sig instructions.

## 2015-06-04 ENCOUNTER — Ambulatory Visit (INDEPENDENT_AMBULATORY_CARE_PROVIDER_SITE_OTHER): Payer: Medicare FFS

## 2015-06-04 ENCOUNTER — Telehealth: Payer: Self-pay | Admitting: Cardiology

## 2015-06-04 DIAGNOSIS — I5022 Chronic systolic (congestive) heart failure: Secondary | ICD-10-CM

## 2015-06-04 DIAGNOSIS — Z95810 Presence of automatic (implantable) cardiac defibrillator: Secondary | ICD-10-CM

## 2015-06-04 NOTE — Progress Notes (Signed)
EPIC Encounter for ICM Monitoring  Patient Name: [name] is a 75 y.o. female Date: 06/04/2015 Primary Care Physican: Nicola Girt, DO Primary Cardiologist: Gwenlyn Found Electrophysiologist: Caryl Comes Dry Weight: 205 lbs   Bi-V Pacing 98%      In the past month, have you:  1. Gained more than 2 pounds in a day or more than 5 pounds in a week? no  2. Had changes in your medications (with verification of current medications)? no  3. Had more shortness of breath than is usual for you? no  4. Limited your activity because of shortness of breath? no  5. Not been able to sleep because of shortness of breath? no  6. Had increased swelling in your feet or ankles? no  7. Had symptoms of dehydration (dizziness, dry mouth, increased thirst, decreased urine output) no  8. Had changes in sodium restriction? no  9. Been compliant with medication? Yes   ICM trend: 3 month view for 06/04/2015   ICM trend: 1 year view for 06/04/2015   Follow-up plan: ICM clinic phone appointment on 07/20/2015.  Thoracic impedance above reference line from which correlates with increased Furosemide on 05/26/2015 at office visit with Richardson Dopp, PA.  She reported SOB but is no worse.   Education given to limit sodium intake to < 2000 mg and fluid intake to 64 oz daily.  Encouraged to call for any fluid symptoms.  No changes today.    Copy of note sent to patient's primary care physician, primary cardiologist, and device following physician.  [name], RN, CCM 06/04/2015 11:30 AM

## 2015-06-04 NOTE — Telephone Encounter (Signed)
Spoke w/ pt husband and requested that pt send a manual transmission w/ her home monitor b/c her home monitor hasn't updated in at least 8 days. Pt husband verbalized understanding and said he would have pt call the office.

## 2015-06-08 ENCOUNTER — Telehealth: Payer: Self-pay | Admitting: Internal Medicine

## 2015-06-08 NOTE — Telephone Encounter (Signed)
New message      Calling to let the nurse know that she did not get her labs drawn.  Now pt says, Richardson Dopp ordered lab work last week.  Her PCP will not draw labs for "outside" doctors and she did not want to drive back to [place] for labs.  Where can she go to have labs drawn?  Is is necessary to still have them drawn?

## 2015-06-09 NOTE — Telephone Encounter (Signed)
I s/w pt about not being able to get lab work done w/PCP yet. She states PCP is out of the office for a couple of days and was told the other dr were in a meeting and would not order the lab work. Pt said she is going to call PCP again today to see if PCP back in the office yet, if so she will ask PCP if she will order the lab work. Pt was given an RX 3/21 for the repeat bmet. I asked pt to let me know if PCP is not going to do lab so I may discuss with Brynda Rim. PA for further recommendations since pt lives in [place], [place]. Pt agreeable to plan of care.

## 2015-06-09 NOTE — Telephone Encounter (Signed)
She can go to urgent care or closest hospital lab  Richardson Dopp, [place]   06/09/2015 5:52 PM

## 2015-06-09 NOTE — Telephone Encounter (Signed)
I stated to pt that she could go to urgent care or hospital though she wants to check with her PCP again first

## 2015-06-15 ENCOUNTER — Telehealth: Payer: Self-pay

## 2015-06-15 NOTE — Telephone Encounter (Signed)
Received call from patient.  She stated she took extra Furosemide dosage this morning due to slightly swollen feet and hands.  No weight gain or changes in breathing.  Weight today 203 lbs which is 2 lbs less than last ICM call.  Reviewed ICM remote transmission she sent this morning due to her symptoms.  Advised the transmission suggest stable fluid levels.  Advised if symptoms worsen or she has other fluid symptoms to call back.  Patient has office visit with Dr Caryl Comes on 06/19/2015.      ICM trend: 06/15/2015

## 2015-06-19 ENCOUNTER — Encounter: Payer: Self-pay | Admitting: Internal Medicine

## 2015-06-19 ENCOUNTER — Ambulatory Visit (INDEPENDENT_AMBULATORY_CARE_PROVIDER_SITE_OTHER): Payer: Medicare FFS | Admitting: Internal Medicine

## 2015-06-19 VITALS — BP 128/76 | HR 86 | Ht 66.0 in | Wt 199.6 lb

## 2015-06-19 DIAGNOSIS — I5022 Chronic systolic (congestive) heart failure: Secondary | ICD-10-CM

## 2015-06-19 DIAGNOSIS — Z95810 Presence of automatic (implantable) cardiac defibrillator: Secondary | ICD-10-CM | POA: Diagnosis not present

## 2015-06-19 DIAGNOSIS — R000 Tachycardia, unspecified: Secondary | ICD-10-CM

## 2015-06-19 DIAGNOSIS — I429 Cardiomyopathy, unspecified: Secondary | ICD-10-CM | POA: Diagnosis not present

## 2015-06-19 DIAGNOSIS — I428 Other cardiomyopathies: Secondary | ICD-10-CM

## 2015-06-19 LAB — CUP PACEART INCLINIC DEVICE CHECK
Battery Remaining Longevity: 73.2
Brady Statistic RA Percent Paced: 0.05 %
Brady Statistic RV Percent Paced: 98 %
Date Time Interrogation Session: 20170414140930
HighPow Impedance: 91.125
Implantable Lead Implant Date: 20160406
Implantable Lead Implant Date: 20160406
Implantable Lead Implant Date: 20160406
Implantable Lead Location: 753858
Implantable Lead Location: 753859
Implantable Lead Location: 753860
Implantable Lead Model: 7122
Lead Channel Impedance Value: 1262.5 Ohm
Lead Channel Impedance Value: 400 Ohm
Lead Channel Impedance Value: 787.5 Ohm
Lead Channel Pacing Threshold Amplitude: 0.75 V
Lead Channel Pacing Threshold Amplitude: 1 V
Lead Channel Pacing Threshold Amplitude: 1.125 V
Lead Channel Pacing Threshold Pulse Width: 0.5 ms
Lead Channel Pacing Threshold Pulse Width: 0.5 ms
Lead Channel Pacing Threshold Pulse Width: 0.5 ms
Lead Channel Sensing Intrinsic Amplitude: 12 mV
Lead Channel Sensing Intrinsic Amplitude: 5 mV
Lead Channel Setting Pacing Amplitude: 2 V
Lead Channel Setting Pacing Amplitude: 2 V
Lead Channel Setting Pacing Amplitude: 2.125
Lead Channel Setting Pacing Pulse Width: 0.5 ms
Lead Channel Setting Pacing Pulse Width: 0.5 ms
Lead Channel Setting Sensing Sensitivity: 0.5 mV
Pulse Gen Serial Number: 7199559

## 2015-06-19 NOTE — Patient Instructions (Addendum)
Medication Instructions: - Your physician recommends that you continue on your current medications as directed. Please refer to the Current Medication list given to you today.  Labwork: - none  Procedures/Testing: - Your physician has recommended that you have an AV optimization echo ([employer]) . During this procedure, an echocardiogram is performed to optimize the timing of your device using ultrasound and a device programmer. Changes will be made to the device settings to help the heart chambers pump more efficiently. This procedure takes approximately one hour.  Follow-Up: - Remote monitoring is used to monitor your Pacemaker of ICD from home. This monitoring reduces the number of office visits required to check your device to one time per year. It allows [place] to keep an eye on the functioning of your device to ensure it is working properly. You are scheduled for a device check from home on 09/21/15. You may send your transmission at any time that day. If you have a wireless device, the transmission will be sent automatically. After your physician reviews your transmission, you will receive a postcard with your next transmission date.  - Your physician wants you to follow-up in: 6 months with Dr. Caryl Comes. You will receive a reminder letter in the mail two months in advance. If you don't receive a letter, please call our office to schedule the follow-up appointment.   Any Additional Special Instructions Will Be Listed Below (If Applicable). - Please wear an abdominal binder as directed by Dr. Caryl Comes.    If you need a refill on your cardiac medications before your next appointment, please call your pharmacy.

## 2015-06-19 NOTE — Progress Notes (Signed)
Patient Care Team: Nicola Girt, DO as PCP - General   HPI  [name] is a 75 y.o. female Seen in follow-up for ICD implanted 4/16 for ischemic cardiomyopathy congestive heart failure and left bundle branch block. She also has a history of prior syncope.  LV function improved post implant from 20-25-->>30-35% last assessed 8/16  Her dyspnea is much improved. She was awakening from the procedure with notably less dyspnea.  She does have some dyspnea on exertion still. She denies peripheral edema.  She has problems with recurrent falls and orthostatic lightheadedness typically occurring 10 or 20 steps following standing. It has not been a problem in the bathroom.  Other medical issues included orthostatic intolerance in the context of chronic hypertension and diabetes  Intercurrently had been seen by her primary care physician in January. She had presented with orthostatic syncope blood pressures dropped 130--100.  Records and Results Reviewed [facility] ecords and outside office notes  Past Medical History  Diagnosis Date  . Syncope and collapse     CAROTID DOPPLER, 08/21/2009 - no significant stenosis demonstrated  . LBBB (left bundle branch block)   . Syncope     2D ECHO, 08/21/2009 - EF 45-50%, normal (but later EFs abnormal)  . OSA (obstructive sleep apnea)     AHI-9.77/hr, during REM-50.32/hr  . Coronary artery disease     a. remote RCA stenting in 2008 with non-DES. b. Cath 06/2014 following abnormal nuc: Stable, unchanged from prior cath, patent stent and 40% LM.  Marland Kitchen Hypertension   . Hyperlipidemia   . Diabetes mellitus ([place])   . Tobacco abuse   . CKD (chronic kidney disease), stage III   . Chronic pain     a. Prior h/o chronic pain on methadone.  . Nonischemic cardiomyopathy ([place])   . Biventricular ICD (implantable cardioverter-defibrillator) in place   . Complication of anesthesia     hard time waking her up from general surgery  . AICD (automatic  cardioverter/defibrillator) present     [place] [id number] ICD serial [phone]  . Chronic systolic CHF (congestive heart failure) (HCC)     a. mixed ischemic/non-ischemic cardiomyopathy. Varying EF over the years but most recently 20%, s/p CRT-D in 06/2014.  Marland Kitchen Headache   . Arthritis   . Anemia     as teenager  . GERD (gastroesophageal reflux disease)   . Anxiety     Past Surgical History  Procedure Laterality Date  . Cardiac catheterization  03/31/2010    Moderate-noncritical CAD  . Cardiac catheterization  05/04/2007    Recommended medical therapy  . Cardiac catheterization  12/14/2006    Recommended stenting of RCA  . Cardiac catheterization  12/14/2006    RCA stented with a 3.0 [employer] Scientific Liberte stent resulting in a reduction of 75% to 0% residual  . Cardiac catheterization  05/14/2004    Recommending medical therapy  . Cardiac catheterization  08/26/1999    No intervention  . Left heart catheterization with coronary angiogram N/A 12/27/2012    Procedure: LEFT HEART CATHETERIZATION WITH CORONARY ANGIOGRAM;  Surgeon: Lorretta Harp, MD;  Location: [facility] CATH LAB;  Service: Cardiovascular;  Laterality: N/A;  . Left and right heart catheterization with coronary angiogram N/A 05/29/2014    Procedure: LEFT AND RIGHT HEART CATHETERIZATION WITH CORONARY ANGIOGRAM;  Surgeon: Lorretta Harp, MD;  Location: [facility] CATH LAB;  Service: Cardiovascular;  Laterality: N/A;  . Bi-ventricular implantable cardioverter defibrillator N/A 06/11/2014    Procedure:  BI-VENTRICULAR IMPLANTABLE CARDIOVERTER DEFIBRILLATOR  (CRT-D);  Surgeon: Deboraha Sprang, MD; [facility] [phone] ICD serial [phone]   . Cholecystectomy      20 years ago  . Eye surgery      bilateral cataract surgery   . Back surgery  2013  . Colonoscopy with propofol N/A 12/15/2014    Procedure: COLONOSCOPY WITH PROPOFOL;  Surgeon: Clarene Essex, MD;  Location: [facility] ENDOSCOPY;  Service: Endoscopy;  Laterality: N/A;    Current Outpatient Prescriptions    Medication Sig Dispense Refill  . ACCU-CHEK AVIVA PLUS test strip 1 each by Other route as needed (glucose).     Marland Kitchen ALPRAZolam (XANAX) 1 MG tablet Take 1 mg by mouth 4 (four) times daily.     Marland Kitchen aspirin EC 81 MG tablet Take 81 mg by mouth at bedtime.    . carvedilol (COREG) 12.5 MG tablet Take 1.5 tablets (18.75 mg total) by mouth 2 (two) times daily with a meal. 540 tablet 3  . citalopram (CELEXA) 40 MG tablet Take 40 mg by mouth every morning.     . clopidogrel (PLAVIX) 75 MG tablet TAKE 1 TABLET EVERY DAY 90 tablet 1  . furosemide (LASIX) 40 MG tablet Take 1 tablet (40 mg total) by mouth as directed. 80 MG IN THE AM; 40 MG IN THE PM 270 tablet 3  . glipiZIDE (GLUCOTROL) 10 MG tablet Take 10 mg by mouth 3 (three) times daily.     Marland Kitchen HYDROcodone-acetaminophen (NORCO/VICODIN) 5-325 MG per tablet Take 1 tablet by mouth every 6 (six) hours as needed for moderate pain.    Marland Kitchen levothyroxine (SYNTHROID, LEVOTHROID) 112 MCG tablet Take 112 mcg by mouth at bedtime.     Marland Kitchen lisinopril (PRINIVIL,ZESTRIL) 2.5 MG tablet TAKE 1 TABLET BY MOUTH DAILY 30 tablet 8  . nitroGLYCERIN (NITROSTAT) 0.4 MG SL tablet Place 1 tablet (0.4 mg total) under the tongue every 5 (five) minutes as needed for chest pain. 25 tablet 3  . nortriptyline (PAMELOR) 50 MG capsule Take 50 mg by mouth at bedtime.     . pantoprazole (PROTONIX) 40 MG tablet Take 40 mg by mouth 2 (two) times daily.     . potassium chloride 20 MEQ TBCR Take 20 mEq by mouth daily. 90 tablet 3  . simvastatin (ZOCOR) 20 MG tablet Take 20 mg by mouth daily with supper.     . Vitamin D, Ergocalciferol, (DRISDOL) 50000 units CAPS capsule Take 50,000 Units by mouth daily.      No current facility-administered medications for this visit.    Allergies  Allergen Reactions  . Valium [Diazepam] Swelling    face      Review of Systems negative except from HPI and PMH  Physical Exam BP 128/76 mmHg  Pulse 86  Ht 5\' 6"  (1.676 m)  Wt 199 lb 9.6 oz (90.538 kg)   BMI 32.23 kg/m2 Well developed and well nourished in no acute distress HENT normal E scleral and icterus clear Neck Supple JVP flat; carotids brisk and full Clear to ausculation Device pocket well healed; without hematoma or erythema.  There is no tethering  soft with active bowel sounds No clubbing cyanosis  Edema Alert and oriented, grossly normal motor and sensory function Skin Warm and Dry  ECG demonstrates P synchronous pacing at 86 Intervals 23/11/37 QRS upright V1 negative lead 1    Assessment and  Plan Ischemic cardiomyopathy  Congestive heart failure-chronic-systolic  Hypertension  Orthostatic hypotension  CRT-D-[employer]. Jude   She is much improved  following CRT-D implantation. She is euvolemic. She still has some shortness of breath. We will undertake AV optimization echo.  Her syncope history was reviewed. It is most consistent with orthostatic syncope corroborated by her orthostatic vital signs obtained today. We will attempt nonpharmacological therapy with an abdominal binder. We will avoid the back brace because it limits back flexion. The event that she has ongoing problems we can try [place]

## 2015-06-29 ENCOUNTER — Ambulatory Visit: Payer: Medicare FFS | Admitting: Physician Assistant

## 2015-07-07 ENCOUNTER — Ambulatory Visit: Payer: Medicare FFS | Admitting: Physician Assistant

## 2015-07-09 ENCOUNTER — Encounter: Payer: Self-pay | Admitting: Physician Assistant

## 2015-07-09 ENCOUNTER — Ambulatory Visit (INDEPENDENT_AMBULATORY_CARE_PROVIDER_SITE_OTHER): Payer: Medicare FFS | Admitting: Physician Assistant

## 2015-07-09 VITALS — BP 120/70 | HR 88 | Ht 66.0 in | Wt 203.0 lb

## 2015-07-09 DIAGNOSIS — I951 Orthostatic hypotension: Secondary | ICD-10-CM

## 2015-07-09 DIAGNOSIS — I5022 Chronic systolic (congestive) heart failure: Secondary | ICD-10-CM

## 2015-07-09 NOTE — Progress Notes (Signed)
Cardiology Office Note   Date:  07/09/2015   ID:  [name], DOB 01-09-1941, MRN [id number]  PCP:  Nicola Girt, DO  Cardiologist:  Dr Caryl Comes, Dr Stacy Gardner, PA-C   Chief Complaint  Patient presents with  . Follow-up    no chest pain, no shortness of breath, no edema, yes pt experiences frequent pain from waist down , no lightheadedness or dizziness    History of Present Illness: [name] is a 75 y.o. female with a history of ICD implant 06/2014 for ICM, S-CHF, LBBB, OSA, RCA stent 2008 (patent w/ 40% LM 2016).  [name] presents for Follow-up  She was seen in the office on 05/25/2015 and her diuretics were adjusted. She was having significant dietary indiscretions.  She was seen in the office for 14 2017, and was having significant problems with orthostatic hypotension. She was requested to get an abdominal binder, but has not obtained yet. She states she will get it today.  Recently, she feels that her respiratory status is at baseline. Her weight is up a few pounds on our scales, but she feels that her home scales are stable. She has not had any chest pain. She has pain in other areas, not related to her heart.  She has continued to have problems with orthostatic dizziness, but that has improved. She has not fallen at home. In the waiting room, patient states she leaned over to pick something up off the floor and did fall. However, she stated the fall was not related to any dizziness or presyncope, she just got off balance.  She is coughing on a regular basis. She has had no sinus drainage and the cough was not productive. She wonders if she can take Delsym for the cough.   Past Medical History  Diagnosis Date  . Syncope and collapse     CAROTID DOPPLER, 08/21/2009 - no significant stenosis demonstrated  . LBBB (left bundle branch block)   . Syncope     2D ECHO, 08/21/2009 - EF 45-50%, normal (but later EFs abnormal)  . OSA (obstructive sleep  apnea)     AHI-9.77/hr, during REM-50.32/hr  . Coronary artery disease     a. remote RCA stenting in 2008 with non-DES. b. Cath 06/2014 following abnormal nuc: Stable, unchanged from prior cath, patent stent and 40% LM.  Marland Kitchen Hypertension   . Hyperlipidemia   . Diabetes mellitus ([place])   . Tobacco abuse   . CKD (chronic kidney disease), stage III   . Chronic pain     a. Prior h/o chronic pain on methadone.  . Nonischemic cardiomyopathy ([place])   . Biventricular ICD (implantable cardioverter-defibrillator) in place   . Complication of anesthesia     hard time waking her up from general surgery  . AICD (automatic cardioverter/defibrillator) present     [place] [id number] ICD serial [phone]  . Chronic systolic CHF (congestive heart failure) (HCC)     a. mixed ischemic/non-ischemic cardiomyopathy. Varying EF over the years but most recently 20%, s/p CRT-D in 06/2014.  Marland Kitchen Headache   . Arthritis   . Anemia     as teenager  . GERD (gastroesophageal reflux disease)   . Anxiety     Past Surgical History  Procedure Laterality Date  . Cardiac catheterization  03/31/2010    Moderate-noncritical CAD  . Cardiac catheterization  05/04/2007    Recommended medical therapy  . Cardiac catheterization  12/14/2006    Recommended stenting  of RCA  . Cardiac catheterization  12/14/2006    RCA stented with a 3.0 [employer] Scientific Liberte stent resulting in a reduction of 75% to 0% residual  . Cardiac catheterization  05/14/2004    Recommending medical therapy  . Cardiac catheterization  08/26/1999    No intervention  . Left heart catheterization with coronary angiogram N/A 12/27/2012    Procedure: LEFT HEART CATHETERIZATION WITH CORONARY ANGIOGRAM;  Surgeon: Lorretta Harp, MD;  Location: [facility] CATH LAB;  Service: Cardiovascular;  Laterality: N/A;  . Left and right heart catheterization with coronary angiogram N/A 05/29/2014    Procedure: LEFT AND RIGHT HEART CATHETERIZATION WITH CORONARY ANGIOGRAM;  Surgeon:  Lorretta Harp, MD;  Location: [facility] CATH LAB;  Service: Cardiovascular;  Laterality: N/A;  . Bradley-ventricular implantable cardioverter defibrillator N/A 06/11/2014    Procedure: Bradley-VENTRICULAR IMPLANTABLE CARDIOVERTER DEFIBRILLATOR  (CRT-D);  Surgeon: Deboraha Sprang, MD; [facility] [phone] ICD serial [phone]   . Cholecystectomy      20 years ago  . Eye surgery      bilateral cataract surgery   . Back surgery  2013  . Colonoscopy with propofol N/A 12/15/2014    Procedure: COLONOSCOPY WITH PROPOFOL;  Surgeon: Clarene Essex, MD;  Location: [facility] ENDOSCOPY;  Service: Endoscopy;  Laterality: N/A;    Current Outpatient Prescriptions  Medication Sig Dispense Refill  . ACCU-CHEK AVIVA PLUS test strip 1 each by Other route as needed (glucose).     Marland Kitchen ALPRAZolam (XANAX) 1 MG tablet Take 1 mg by mouth 4 (four) times daily.     Marland Kitchen aspirin EC 81 MG tablet Take 81 mg by mouth at bedtime.    . carvedilol (COREG) 12.5 MG tablet Take 1.5 tablets (18.75 mg total) by mouth 2 (two) times daily with a meal. 540 tablet 3  . citalopram (CELEXA) 40 MG tablet Take 40 mg by mouth every morning.     . clopidogrel (PLAVIX) 75 MG tablet TAKE 1 TABLET EVERY DAY 90 tablet 1  . furosemide (LASIX) 40 MG tablet Take 1 tablet (40 mg total) by mouth as directed. 80 MG IN THE AM; 40 MG IN THE PM 270 tablet 3  . glipiZIDE (GLUCOTROL) 10 MG tablet Take 10 mg by mouth 3 (three) times daily.     Marland Kitchen HYDROcodone-acetaminophen (NORCO/VICODIN) 5-325 MG per tablet Take 1 tablet by mouth every 6 (six) hours as needed for moderate pain.    Marland Kitchen levothyroxine (SYNTHROID, LEVOTHROID) 112 MCG tablet Take 112 mcg by mouth at bedtime.     Marland Kitchen lisinopril (PRINIVIL,ZESTRIL) 2.5 MG tablet TAKE 1 TABLET BY MOUTH DAILY 30 tablet 8  . nitroGLYCERIN (NITROSTAT) 0.4 MG SL tablet Place 1 tablet (0.4 mg total) under the tongue every 5 (five) minutes as needed for chest pain. 25 tablet 3  . nortriptyline (PAMELOR) 50 MG capsule Take 50 mg by mouth at bedtime.     .  pantoprazole (PROTONIX) 40 MG tablet Take 40 mg by mouth 2 (two) times daily.     . potassium chloride 20 MEQ TBCR Take 20 mEq by mouth daily. 90 tablet 3  . simvastatin (ZOCOR) 20 MG tablet Take 20 mg by mouth daily with supper.     . Vitamin D, Ergocalciferol, (DRISDOL) 50000 units CAPS capsule Take 50,000 Units by mouth daily. Reported on 07/09/2015     No current facility-administered medications for this visit.    Allergies:   Valium    Social History:  The patient  reports that she quit smoking  about 19 months ago. Her smoking use included E-cigarettes. She started smoking about 62 years ago. She has a 58 pack-year smoking history. She has never used smokeless tobacco. She reports that she does not drink alcohol or use illicit drugs.   Family History:  The patient's family history includes Coronary artery disease in her father; Healthy in her sister and sister; Heart disease in her brother; Hypertension in her mother; Other in her brother; Stroke in her brother and mother.    ROS:  Please see the history of present illness. All other systems are reviewed and negative.    PHYSICAL EXAM: VS:  BP 120/70 mmHg  Pulse 88  Ht 5\' 6"  (1.676 m)  Wt 203 lb (92.08 kg)  BMI 32.78 kg/m2 , BMI Body mass index is 32.78 kg/(m^2). GEN: Well nourished, well developed, female in no acute distress HEENT: normal for age  Neck: no JVD, no carotid bruit, no masses Cardiac: RRR; no murmur, no rubs, or gallops Respiratory:  clear to auscultation bilaterally, normal work of breathing GI: soft, nontender, nondistended, + BS MS: no deformity or atrophy; trace pedal edema; distal pulses are 2+ in all 4 extremities Skin: warm and dry, no rash Neuro:  Strength and sensation are intact Psych: euthymic mood, full affect   EKG:  EKG is not ordered today.   Recent Labs: 07/30/2014: Pro B Natriuretic peptide (BNP) 82.0 05/26/2015: BUN 17; Creat 1.39*; Hemoglobin 12.3; Platelets 268; Potassium 4.5; Sodium 140     Wt Readings from Last 3 Encounters:  07/09/15 203 lb (92.08 kg)  06/19/15 199 lb 9.6 oz (90.538 kg)  05/26/15 203 lb 9.6 oz (92.352 kg)     Other studies Reviewed: Additional studies/ records that were reviewed today include: Office notes and testing.  ASSESSMENT AND PLAN:  1.  Orthostatic hypotension: Patient states she will obtain the abdominal binder to date and wear it. She is to track her symptoms and let us know if they worsen.  2. Chronic systolic CHF: Her weight is up minimally, but her symptoms are baseline. Continue current therapy. Her blood pressure and heart rate are well controlled. She is on good therapy with aspirin, beta blocker, Plavix, lisinopril, and a statin. We need to make sure that her family physician is following her lipid profile. She will get an echocardiogram prior to her next visit with Dr.Berry  3. Cough: She has no signs of acute illness on physical exam or by symptoms. She may have some seasonal allergies. She is encouraged to take Delsym for cough and it is okay to use steroid nasal spray or antihistamines as long as they do not include decongestants.   Current medicines are reviewed at length with the patient today.  The patient does not have concerns regarding medicines.  The following changes have been made:  no change  Labs/ tests ordered today include:  No orders of the defined types were placed in this encounter.     Disposition:   FU with Dr. Gwenlyn Found and Dr. Caryl Comes as scheduled  Signed, Lenoard Aden  07/09/2015 1:01 PM    [facility] Phone: [phone]; Fax: [phone]  This note was written with the assistance of speech recognition software. Please excuse any transcriptional errors.

## 2015-07-09 NOTE — Patient Instructions (Addendum)
[name] recommends that you be sure to get the abdominal binder.  Ok to take the following medications.  Delsym for cough Steroid nasal sprays Antihistamines ( NO DECONGESTANTS)  Your physician recommends that you schedule a follow-up appointment With Dr Gwenlyn Found per recall. Be sure to have your echo prior to that visit.

## 2015-07-10 ENCOUNTER — Telehealth: Payer: Self-pay | Admitting: Physician Assistant

## 2015-07-10 MED ORDER — LISINOPRIL 2.5 MG PO TABS: 2.5000 mg | ORAL_TABLET | Freq: Every day | ORAL | Status: DC

## 2015-07-10 NOTE — Telephone Encounter (Signed)
Rx(s) sent to pharmacy electronically.  

## 2015-07-10 NOTE — Telephone Encounter (Signed)
New message    Pt c/o medication issue:  1. Name of Medication: Lisinopril  2. How are you currently taking this medication (dosage and times per day)? 2.5mg  once a day  3. Are you having a reaction (difficulty breathing--STAT)? no  4. What is your medication issue? Pt needing the medication re-faxed into [place] [phone] phone to the pharmacy)

## 2015-07-16 ENCOUNTER — Ambulatory Visit: Payer: Medicare FFS | Admitting: Physician Assistant

## 2015-07-20 ENCOUNTER — Ambulatory Visit (INDEPENDENT_AMBULATORY_CARE_PROVIDER_SITE_OTHER): Payer: Medicare FFS

## 2015-07-20 ENCOUNTER — Telehealth: Payer: Self-pay | Admitting: Cardiology

## 2015-07-20 DIAGNOSIS — Z95810 Presence of automatic (implantable) cardiac defibrillator: Secondary | ICD-10-CM | POA: Diagnosis not present

## 2015-07-20 DIAGNOSIS — I5022 Chronic systolic (congestive) heart failure: Secondary | ICD-10-CM | POA: Diagnosis not present

## 2015-07-20 NOTE — Telephone Encounter (Signed)
Spoke with pt and reminded pt of remote transmission that is due today. Pt verbalized understanding.   

## 2015-07-21 NOTE — Progress Notes (Signed)
EPIC Encounter for ICM Monitoring  Patient Name: [name] is a 75 y.o. female Date: 07/21/2015 Primary Care Physican: Nicola Girt, DO Primary Cardiologist: Gwenlyn Found Electrophysiologist: Caryl Comes Dry Weight: 204 lbs   Bi-V Pacing 98%      In the past month, have you:  1. Gained more than 2 pounds in a day or more than 5 pounds in a week? no  2. Had changes in your medications (with verification of current medications)? no  3. Had more shortness of breath than is usual for you? no  4. Limited your activity because of shortness of breath? no  5. Not been able to sleep because of shortness of breath? no  6. Had increased swelling in your feet, ankles, legs or stomach area? no  7. Had symptoms of dehydration (dizziness, dry mouth, increased thirst, decreased urine output) no  8. Had changes in sodium restriction? no  9. Been compliant with medication? Yes  ICM trend: 3 month view for 07/20/2015   ICM trend: 1 year view for 07/20/2015   Follow-up plan: ICM clinic phone appointment 08/21/2015.    FLUID LEVELS:  Corvue thoracic impedance decreased 06/29/2015 to 07/06/2015, 07/15/2015 to 07/19/2015 suggesting fluid accumulation.    SYMPTOMS:   None.  Denied any symptoms such as weight gain of 3 pounds overnight or 5 pounds within a week, SOB and/or lower extremity swelling.   She stated her weight does fluctuate by about 2 lbs. Encouraged to call for any fluid symptoms.   EDUCATION:    Reminded to limit sodium intake to < 2000 mg and fluid intake to 64 oz daily.   No changes today.     [name], RN, CCM 07/21/2015 12:52 PM

## 2015-08-21 ENCOUNTER — Ambulatory Visit (INDEPENDENT_AMBULATORY_CARE_PROVIDER_SITE_OTHER): Payer: Medicare FFS

## 2015-08-21 ENCOUNTER — Telehealth: Payer: Self-pay | Admitting: Cardiology

## 2015-08-21 DIAGNOSIS — Z95810 Presence of automatic (implantable) cardiac defibrillator: Secondary | ICD-10-CM | POA: Diagnosis not present

## 2015-08-21 DIAGNOSIS — I5022 Chronic systolic (congestive) heart failure: Secondary | ICD-10-CM

## 2015-08-21 NOTE — Progress Notes (Signed)
EPIC Encounter for ICM Monitoring  Patient Name: [name] is a 75 y.o. female Date: 08/21/2015 Primary Care Physican: Nicola Girt, DO Primary Cardiologist: Gwenlyn Found Electrophysiologist: Caryl Comes Dry Weight: 203.4 lbs   Bi-V Pacing 99%      In the past month, have you:  1. Gained more than 2 pounds in a day or more than 5 pounds in a week? No  2. Had changes in your medications (with verification of current medications)? No  3. Had more shortness of breath than is usual for you? No   4. Limited your activity because of shortness of breath? No   5. Not been able to sleep because of shortness of breath? No   6. Had increased swelling in your feet, ankles, legs or stomach area? No   7. Had symptoms of dehydration (dizziness, dry mouth, increased thirst, decreased urine output) No   8. Had changes in sodium restriction? No   9. Been compliant with medication? Yes   ICM trend: 3 month view for 08/21/2015   ICM trend: 1 year view for 08/21/2015   Follow-up plan: ICM clinic phone appointment 09/24/2015.    FLUID LEVELS: Corvue thoracic impedance decreased 08/05/2015 to 08/08/2015 suggesting fluid accumulation and returned to baseline 08/08/2015.    SYMPTOMS:   None.  Denied any symptoms such as weight gain of 3 pounds overnight or 5 pounds within a week, SOB and/or lower extremity swelling. Encouraged to call for any fluid symptoms.   EDUCATION: Limit sodium intake to < 2000 mg and fluid intake to 64 oz daily.     RECOMMENDATIONS: No changes today.    [name], RN, CCM 08/21/2015 10:26 AM

## 2015-08-21 NOTE — Telephone Encounter (Signed)
Spoke with pt and reminded pt of remote transmission that is due today. Pt verbalized understanding.   

## 2015-09-07 ENCOUNTER — Telehealth: Payer: Self-pay | Admitting: Cardiovascular Disease

## 2015-09-07 NOTE — Telephone Encounter (Signed)
Returned call. Advised patient isosorbide discontinued by [facility] Dopp in march. Patient has had no need for med (no chest pain, no anginal symptoms). [employer] delivered 2 bottles by mistake. Contacted [employer] to report refill & delivery error - verified discontinuation of isosorbide.

## 2015-09-07 NOTE — Telephone Encounter (Signed)
New message      Pt c/o medication issue:  1. Name of Medication: isosorbide 2. How are you currently taking this medication (dosage and times per day)? 30mg  daily  3. Are you having a reaction (difficulty breathing--STAT)? no 4. What is your medication issue? Pt states her mail order pharmacy sent her a refill for this medication.  She has not taken this medication in a while.  She thought she was off of this. Should she be taking this medication?

## 2015-09-07 NOTE — Telephone Encounter (Signed)
Returned call, goes straight to a VM msg notifying that a message can't be left due to the mailbox being full.

## 2015-09-21 ENCOUNTER — Telehealth: Payer: Self-pay | Admitting: Cardiovascular Disease

## 2015-09-21 NOTE — Telephone Encounter (Signed)
Spoke with pt, she found a bottle of isosorbide that had fallen on the floor. She is not sure the last time she took it or if she is supposed to take it. Advised pt, she is not to take isosorbide

## 2015-09-21 NOTE — Telephone Encounter (Signed)
Please call,question about her Isosorbide [place] [facility].

## 2015-09-22 ENCOUNTER — Other Ambulatory Visit (HOSPITAL_COMMUNITY): Payer: Medicare FFS

## 2015-09-24 ENCOUNTER — Telehealth: Payer: Self-pay

## 2015-09-24 ENCOUNTER — Ambulatory Visit (INDEPENDENT_AMBULATORY_CARE_PROVIDER_SITE_OTHER): Payer: Medicare FFS | Admitting: *Deleted

## 2015-09-24 DIAGNOSIS — I428 Other cardiomyopathies: Secondary | ICD-10-CM

## 2015-09-24 DIAGNOSIS — I5022 Chronic systolic (congestive) heart failure: Secondary | ICD-10-CM

## 2015-09-24 DIAGNOSIS — I429 Cardiomyopathy, unspecified: Secondary | ICD-10-CM | POA: Diagnosis not present

## 2015-09-24 DIAGNOSIS — Z95810 Presence of automatic (implantable) cardiac defibrillator: Secondary | ICD-10-CM | POA: Diagnosis not present

## 2015-09-24 NOTE — Progress Notes (Signed)
Remote ICD transmission.   

## 2015-09-24 NOTE — Progress Notes (Signed)
EPIC Encounter for ICM Monitoring  Patient Name: [name] is a 75 y.o. female Date: 09/24/2015 Primary Care Physican: Nicola Girt, DO Primary Cardiologist: Gwenlyn Found Electrophysiologist: Caryl Comes Dry Weight: 204.2 lb  Bi-V Pacing:  >99%       Heart Failure questions reviewed, pt symptomatic always has swelling in both feet but not any worse in the last week.  Weight has increased from 198 lbs in the last week to 204.2.    Thoracic impedence abnormal suggesting fluid accumulation since 09/20/2015  LABS:   05/26/2015 Creatinine 1.39, BUN 17, Potassium 4.5, Sodium 140 02/26/2015 Creatinine 1.38, BUN 18, Potassium 3.9, Sodium 139  Recommendations:  Sent to Dr Gwenlyn Found for recommendations.  She has an office appointment with Dr Gwenlyn Found on 10/28/2015.   She declined any additional appointments due to finances but did confirm she will be at the appointment with Dr Gwenlyn Found.    ICM trend: 09/24/2015     Follow-up plan: ICM clinic phone appointment on pending.  Copy of ICM check sent to primary cardiologist and device physician.   [name], RN 09/24/2015 11:17 AM

## 2015-09-24 NOTE — Telephone Encounter (Signed)
Call to patient. Requested to send ICM remote transmission.  Instructed how to send manual transmission.  Patient has been keeping her monitor in the living room preventing the transmissions to be sent automatically.  Advised patient to move to table beside of bed so transmission can be sent automatically.  She stated she would do so.

## 2015-09-25 ENCOUNTER — Other Ambulatory Visit (HOSPITAL_COMMUNITY): Payer: Medicare FFS

## 2015-09-25 ENCOUNTER — Encounter: Payer: Self-pay | Admitting: Cardiology

## 2015-09-25 LAB — CUP PACEART REMOTE DEVICE CHECK
Battery Remaining Longevity: 71 mo
Battery Remaining Percentage: 78 %
Battery Voltage: 2.98 V
Brady Statistic AP VP Percent: 1 %
Brady Statistic AP VS Percent: 0 %
Brady Statistic AS VP Percent: 99 %
Brady Statistic AS VS Percent: 1 %
Brady Statistic RA Percent Paced: 1 %
Date Time Interrogation Session: 20170720150507
HighPow Impedance: 87 Ohm
HighPow Impedance: 87 Ohm
Implantable Lead Implant Date: 20160406
Implantable Lead Implant Date: 20160406
Implantable Lead Implant Date: 20160406
Implantable Lead Location: 753858
Implantable Lead Location: 753859
Implantable Lead Location: 753860
Implantable Lead Model: 7122
Lead Channel Impedance Value: 1175 Ohm
Lead Channel Impedance Value: 380 Ohm
Lead Channel Impedance Value: 750 Ohm
Lead Channel Pacing Threshold Amplitude: 0.875 V
Lead Channel Pacing Threshold Amplitude: 0.875 V
Lead Channel Pacing Threshold Amplitude: 1.25 V
Lead Channel Pacing Threshold Pulse Width: 0.5 ms
Lead Channel Pacing Threshold Pulse Width: 0.5 ms
Lead Channel Pacing Threshold Pulse Width: 0.5 ms
Lead Channel Sensing Intrinsic Amplitude: 12 mV
Lead Channel Sensing Intrinsic Amplitude: 4.9 mV
Lead Channel Setting Pacing Amplitude: 1.875
Lead Channel Setting Pacing Amplitude: 2 V
Lead Channel Setting Pacing Amplitude: 2.25 V
Lead Channel Setting Pacing Pulse Width: 0.5 ms
Lead Channel Setting Pacing Pulse Width: 0.5 ms
Lead Channel Setting Sensing Sensitivity: 0.5 mV
Pulse Gen Serial Number: 7199559

## 2015-09-25 NOTE — Progress Notes (Signed)
Should probably see a mid-level provider before she sees me given her increased weight and thoracic impedance.

## 2015-09-25 NOTE — Progress Notes (Addendum)
Patient offered at [facility] call mid level office appointment but declined due to unable to afford costs of additional appointments.

## 2015-09-25 NOTE — Progress Notes (Signed)
Call to patient and advised Dr Gwenlyn Found would like for her to see a PA or NP and patient declined due to finances.  Advised to use local emergency number for worsening symptoms or if she changes her mind about an appointment with PA/NP before 10/28/2015 then call the office to make an appointment.  Will recheck fluid levels on 10/08/2015

## 2015-09-29 ENCOUNTER — Other Ambulatory Visit (HOSPITAL_COMMUNITY): Payer: Medicare FFS

## 2015-09-29 ENCOUNTER — Ambulatory Visit: Payer: Medicare FFS | Admitting: Cardiovascular Disease

## 2015-10-06 ENCOUNTER — Other Ambulatory Visit (HOSPITAL_COMMUNITY): Payer: Medicare FFS

## 2015-10-08 ENCOUNTER — Ambulatory Visit (INDEPENDENT_AMBULATORY_CARE_PROVIDER_SITE_OTHER): Payer: Medicare FFS

## 2015-10-08 DIAGNOSIS — I5022 Chronic systolic (congestive) heart failure: Secondary | ICD-10-CM

## 2015-10-08 DIAGNOSIS — Z95810 Presence of automatic (implantable) cardiac defibrillator: Secondary | ICD-10-CM

## 2015-10-08 NOTE — Progress Notes (Signed)
EPIC Encounter for ICM Monitoring  Patient Name: [name] is a 75 y.o. female Date: 10/08/2015 Primary Care Physican: Nicola Girt, DO Primary Cardiologist: Gwenlyn Found Electrophysiologist: Caryl Comes Dry Weight: 201 lbs Bi-V Pacing:  >99%       Heart Failure questions reviewed, pt asymptomatic   Since last ICM transmission on 09/24/2015, thoracic impedance returned to normal due to she decreased the daily fluid intake.  Recommendations: No changes.  Appointment with Dr Gwenlyn Found 10/28/2015.  ICM trend: 10/08/2015     Follow-up plan: ICM clinic phone appointment on 12/01/2015.  Copy of ICM check sent to cardiologist and device physician for updated transmission and impedance returned to normal.   [name], RN 10/08/2015 3:13 PM

## 2015-10-16 ENCOUNTER — Other Ambulatory Visit (HOSPITAL_COMMUNITY): Payer: Medicare FFS

## 2015-10-22 ENCOUNTER — Other Ambulatory Visit: Payer: Self-pay

## 2015-10-22 ENCOUNTER — Encounter: Payer: Self-pay | Admitting: Internal Medicine

## 2015-10-22 ENCOUNTER — Encounter (INDEPENDENT_AMBULATORY_CARE_PROVIDER_SITE_OTHER): Payer: Self-pay

## 2015-10-22 ENCOUNTER — Ambulatory Visit (HOSPITAL_COMMUNITY): Payer: Medicare FFS | Attending: Cardiovascular Disease

## 2015-10-22 DIAGNOSIS — I5022 Chronic systolic (congestive) heart failure: Secondary | ICD-10-CM

## 2015-10-22 DIAGNOSIS — I429 Cardiomyopathy, unspecified: Secondary | ICD-10-CM | POA: Diagnosis not present

## 2015-10-22 DIAGNOSIS — I428 Other cardiomyopathies: Secondary | ICD-10-CM | POA: Diagnosis not present

## 2015-10-28 ENCOUNTER — Encounter: Payer: Self-pay | Admitting: Cardiovascular Disease

## 2015-10-28 ENCOUNTER — Ambulatory Visit (INDEPENDENT_AMBULATORY_CARE_PROVIDER_SITE_OTHER): Payer: Medicare FFS | Admitting: Cardiovascular Disease

## 2015-10-28 DIAGNOSIS — I2510 Atherosclerotic heart disease of native coronary artery without angina pectoris: Secondary | ICD-10-CM

## 2015-10-28 DIAGNOSIS — E785 Hyperlipidemia, unspecified: Secondary | ICD-10-CM

## 2015-10-28 DIAGNOSIS — Z95810 Presence of automatic (implantable) cardiac defibrillator: Secondary | ICD-10-CM | POA: Diagnosis not present

## 2015-10-28 DIAGNOSIS — I10 Essential (primary) hypertension: Secondary | ICD-10-CM

## 2015-10-28 NOTE — Assessment & Plan Note (Signed)
History of biventricular ICD implantation by Dr. Jolyn Nap 06/11/14 with recent 2-D echo performed 10/22/15 revealing an EF of 50-55%.

## 2015-10-28 NOTE — Assessment & Plan Note (Signed)
History of coronary artery disease status post remote stenting of her RCA with mild to moderate left main disease. Her last catheterization performed by myself 05/29/14 revealed unchanged anatomy with severe LV dysfunction. She sexually underwent biventricular ICD implantation by Dr. Jonna Munro 06/11/14 with an excellent clinical result.

## 2015-10-28 NOTE — Assessment & Plan Note (Signed)
History of hypertension with blood pressure measured at 120/68. She is on carvedilol, and lisinopril. Continue current meds at current dosing

## 2015-10-28 NOTE — Patient Instructions (Signed)

## 2015-10-28 NOTE — Progress Notes (Signed)
10/28/2015 [name]   1940/09/07  [id number]  Primary Physician Nicola Girt, DO Primary Cardiologist: Lorretta Harp MD [name]  HPI:  The patient is a 75 year old mildly overweight married Caucasian female, mother of 64, grandmother of 2 grandchildren, whom I last saw in the office 09/03/14.Marland Kitchen She has a history of CAD, status post RCA stenting in the past. She was catheterized by Dr. Janene Madeira May 04, 2007, after abnormal Myoview revealing 30% to 40% eccentric distal left main. A proximal RCA stent was patent and her EF was normal. Other problems include hypertension, hyperlipidemia, and diabetes. She has chronic left bundle branch block. She smokes 1-1/2 packs of cigarettes a day. She has had back surgery by Dr. Lissa Merlin. Myoview stress test performed March 24, 2010, revealed new anteroapical scar, and because of shortness of breath I catheterized her March 31, 2010, revealing 50% "in-stent restenosis" within the RCA stent, which was smooth, 40% distal left main with anatomy unchanged from the prior study and normal LV function. She really denies chest pain or shortness of breath. Her most recent lab work, performed by Dr. Buddy Duty in June, revealed a total cholesterol of 138, LDL of 57, HDL of 44.  I saw her one year ago. Over the last several weeks she developed new onset chest pressure occurring every other day lasting for minutes at a time associated with diaphoresis. I'm concerned that she may have progression of her left main disease or and/or her RCA in-stent restenosis. Her Myoview performed prior to her last cath in January 2012 was remarkable for anteroapical scar versus breast attenuation artifact. Based on her anatomy and her symptoms I elected to proceed with outpatient diagnostic coronary arteriography which I performed on 12/26/12 revealing essentially unchanged anatomy. Her RCA stent was patent her left main was mild. She did have mild to moderate left  ventricular dysfunction with an EF in the 45% range. 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 She gets occasional chest pain which has not changed in frequency or severity. She does complain of back pain and leg pain and is seeing Dr. Ellene Route for this. She has decreased pedal pulses on exam raising the issue of the possibility of peripheral vascular disease. Lower extremity arterial Doppler studies performed in our office 03/14/14 revealed ABIs of 1 bilaterally high-frequency signal in the left external iliac artery although her symptoms are symmetric. She saw Molli Hazard in the office 05/14/14 complaining of dyspnea on exertion and diaphoresis. A 2-D echo performed 05/20/14 as well as a Myoview stress test showed significant worsening of LV function with an EF 20%, a dilated left ventricle and moderate MR with a Myoview that showed scar in the anterior wall apex and septum markedly different than prior functional studies. Based on this result I performed cardiac catheterization on her 05/29/14 revealing unchanged coronary anatomy which was deemed as moderate and stable with severe LV dysfunction and elevated filling pressures. She subsequently underwent biventricular ICD implantation by Dr. Caryl Comes on 06/11/14 with an excellent clinical result which was almost immediate. She now denies chest pain or shortness of breath. Her recent 2-D echo performed 10/22/15 revealed an EF of 50-55%.    Current Outpatient Prescriptions  Medication Sig Dispense Refill  . ACCU-CHEK AVIVA PLUS test strip 1 each by Other route as needed (glucose).     Marland Kitchen ALPRAZolam (XANAX) 1 MG tablet Take 1  mg by mouth 2 (two) times daily as needed for anxiety.     Marland Kitchen aspirin EC 81 MG tablet Take 81 mg by mouth at bedtime.    . carvedilol (COREG) 12.5 MG tablet Take 1.5 tablets (18.75 mg total) by mouth 2 (two) times daily with a meal. 540 tablet 3  .  clopidogrel (PLAVIX) 75 MG tablet TAKE 1 TABLET EVERY DAY 90 tablet 1  . furosemide (LASIX) 40 MG tablet Take 1 tablet (40 mg total) by mouth as directed. 80 MG IN THE AM; 40 MG IN THE PM 270 tablet 3  . glipiZIDE (GLUCOTROL) 10 MG tablet Take 10 mg by mouth 3 (three) times daily.     . insulin detemir (LEVEMIR) 100 UNIT/ML injection Inject 24 Units into the skin daily.    Marland Kitchen levothyroxine (SYNTHROID, LEVOTHROID) 112 MCG tablet Take 112 mcg by mouth at bedtime.     Marland Kitchen lisinopril (PRINIVIL,ZESTRIL) 2.5 MG tablet Take 1 tablet (2.5 mg total) by mouth daily. 90 tablet 3  . nitroGLYCERIN (NITROSTAT) 0.4 MG SL tablet Place 1 tablet (0.4 mg total) under the tongue every 5 (five) minutes as needed for chest pain. 25 tablet 3  . nortriptyline (PAMELOR) 50 MG capsule Take 50 mg by mouth at bedtime.     Marland Kitchen oxyCODONE-acetaminophen (PERCOCET/ROXICET) 5-325 MG tablet Take 1 tablet by mouth every 8 (eight) hours as needed for severe pain.    . pantoprazole (PROTONIX) 40 MG tablet Take 40 mg by mouth 2 (two) times daily.     . potassium chloride 20 MEQ TBCR Take 20 mEq by mouth daily. 90 tablet 3  . simvastatin (ZOCOR) 20 MG tablet Take 20 mg by mouth daily with supper.     . Vitamin D, Ergocalciferol, (DRISDOL) 50000 units CAPS capsule Take 50,000 Units by mouth daily. Reported on 07/09/2015     No current facility-administered medications for this visit.     Allergies  Allergen Reactions  . Valium [Diazepam] Swelling    face    Social History   Social History  . Marital status: Married    Spouse name: N/A  . Number of children: N/A  . Years of education: N/A   Occupational History  . Not on file.   Social History Main Topics  . Smoking status: Former Smoker    Packs/day: 1.00    Years: 58.00    Types: E-cigarettes    Start date: 12/26/1952    Quit date: 12/05/2013  . Smokeless tobacco: Never Used     Comment: uses Vape cigarettes  . Alcohol use No  . Drug use: No  . Sexual activity: Not on  file   Other Topics Concern  . Not on file   Social History Narrative  . No narrative on file     Review of Systems: General: negative for chills, fever, night sweats or weight changes.  Cardiovascular: negative for chest pain, dyspnea on exertion, edema, orthopnea, palpitations, paroxysmal nocturnal dyspnea or shortness of breath Dermatological: negative for rash Respiratory: negative for cough or wheezing Urologic: negative for hematuria Abdominal: negative for nausea, vomiting, diarrhea, bright red blood per rectum, melena, or hematemesis Neurologic: negative for visual changes, syncope, or dizziness All other systems reviewed and are otherwise negative except as noted above.    Blood pressure 120/68, pulse 82, height 5\' 6"  (1.676 m), weight 200 lb (90.7 kg).  General appearance: alert and no distress Neck: no adenopathy, no carotid bruit, no JVD, supple, symmetrical, trachea midline and thyroid not  enlarged, symmetric, no tenderness/mass/nodules Lungs: clear to auscultation bilaterally Heart: regular rate and rhythm, S1, S2 normal, no murmur, click, rub or gallop Extremities: extremities normal, atraumatic, no cyanosis or edema  EKG not performed today  ASSESSMENT AND PLAN:   Essential hypertension History of hypertension with blood pressure measured at 120/68. She is on carvedilol, and lisinopril. Continue current meds at current dosing  Coronary atherosclerosis History of coronary artery disease status post remote stenting of her RCA with mild to moderate left main disease. Her last catheterization performed by myself 05/29/14 revealed unchanged anatomy with severe LV dysfunction. She sexually underwent biventricular ICD implantation by Dr. Jonna Munro 06/11/14 with an excellent clinical result.  Hyperlipidemia History of hyperlipidemia  on statin therapy followed by her PCP  ICD (implantable cardioverter-defibrillator), biventricular, in situ History of biventricular ICD  implantation by Dr. Jolyn Nap 06/11/14 with recent 2-D echo performed 10/22/15 revealing an EF of 50-55%.      Lorretta Harp MD FACP,FACC,FAHA, [facility] 10/28/2015 11:25 AM

## 2015-10-28 NOTE — Assessment & Plan Note (Signed)
History of hyperlipidemia on statin therapy followed by her PCP. 

## 2015-12-01 ENCOUNTER — Telehealth: Payer: Self-pay | Admitting: Cardiology

## 2015-12-01 ENCOUNTER — Ambulatory Visit (INDEPENDENT_AMBULATORY_CARE_PROVIDER_SITE_OTHER): Payer: Medicare FFS

## 2015-12-01 DIAGNOSIS — I5022 Chronic systolic (congestive) heart failure: Secondary | ICD-10-CM

## 2015-12-01 DIAGNOSIS — Z95810 Presence of automatic (implantable) cardiac defibrillator: Secondary | ICD-10-CM | POA: Diagnosis not present

## 2015-12-01 NOTE — Telephone Encounter (Signed)
Spoke with pt and reminded pt of remote transmission that is due today. Pt verbalized understanding.   

## 2015-12-02 NOTE — Progress Notes (Signed)
EPIC Encounter for ICM Monitoring  Patient Name: [name] Po is a 75 y.o. female Date: 12/02/2015 Primary Care Physican: Nicola Girt, DO Primary Cardiologist: Gwenlyn Found Electrophysiologist: Caryl Comes Dry Weight: 203 lb  Bi-V Pacing:  >99%       Heart Failure questions reviewed, pt symptomatic with 3 lb weight gain.  Thoracic impedance abnormal suggesting fluid accumulation since 11/16/2015.  She said she is drinking too much fluid because she stays so thirsty.  Advised to limit fluid intake to 2 liters a day and salt to 2000 mg daily.    She is currently taking Furosemide 40 mg bid and Potassium 20 mEq daily  LABS: 09/18/2015 Creatinine 1.54, BUN 19, Potassium 4.3, Sodium 137 07/09/2015 Creatinine 1.55, BUN 20, Potassium 4.2, Sodium 138 05/26/2015 Creatinine 1.39, BUN 17, Potassium 4.5, Sodium 140 04/10/2015 Creatinine 1.28, BUN 16, Potassium 3.9. Sodium 138 Creatinine ranges 1.35 to 1.54 in 2016  Recommendations:  Copy of ICM check sent to Dr Gwenlyn Found and Dr Caryl Comes for review and recommendations.   Last visit with Dr Gwenlyn Found was 10/28/2015.  Patient declines to come in for an office visit due to she lives 50 miles from the office and difficult for her financially.    Follow-up plan: ICM clinic phone appointment on 12/10/2015 to recheck fluid levels.    ICM trend: 12/01/2015       [name], RN 12/02/2015 8:03 AM

## 2015-12-03 NOTE — Progress Notes (Signed)
Call to patient and advised of Dr Olin Pia recommendation to increase Furosemide to 80 mg bid x 3 days and and after 3rd day returned to prescribed dosage of 80 mg am and 40 mg pm.  Increase Potassium 20 mEq 1 tablet bid x 3 day and after 3rd day, return to prescribed dosage of 1 tablet a day.   She verbalized understanding.  Recheck fluid levels 12/10/2015.

## 2015-12-03 NOTE — Progress Notes (Signed)
Reviewed with Dr Caryl Comes in the office and recommended to increase Furosemide to 80 mg bid x 3 days and and after 3rd day returned to prescribed dosage of 80 mg am and 40 mg pm.  Increase Potassium 20 mEq 1 tablet bid x 3 day and after 3rd day, return to prescribed dosage of 1 tablet a day.    Recheck fluid levels 12/10/2015.

## 2015-12-08 ENCOUNTER — Other Ambulatory Visit: Payer: Self-pay | Admitting: Cardiovascular Disease

## 2015-12-08 NOTE — Telephone Encounter (Signed)
Rx request sent to pharmacy.  

## 2015-12-10 ENCOUNTER — Telehealth: Payer: Self-pay

## 2015-12-10 ENCOUNTER — Ambulatory Visit (INDEPENDENT_AMBULATORY_CARE_PROVIDER_SITE_OTHER): Payer: Medicare FFS

## 2015-12-10 DIAGNOSIS — Z95810 Presence of automatic (implantable) cardiac defibrillator: Secondary | ICD-10-CM | POA: Diagnosis not present

## 2015-12-10 DIAGNOSIS — I5022 Chronic systolic (congestive) heart failure: Secondary | ICD-10-CM | POA: Diagnosis not present

## 2015-12-10 NOTE — Telephone Encounter (Signed)
Remote ICM transmission received.  Attempted patient call and left message with son for her to return call.

## 2015-12-10 NOTE — Progress Notes (Signed)
EPIC Encounter for ICM Monitoring  Patient Name: [name] is a 75 y.o. female Date: 12/10/2015 Primary Care Physican: Nicola Girt, DO Primary [place] Electrophysiologist: Caryl Comes Dry Weight:    unknown  Bi-V Pacing:  >99%         Attempted ICM call and unable to reach.  Transmission reviewed.   Thoracic impedance returned to normal after increase in Furosemide x 3 days.  Follow-up plan: ICM clinic phone appointment on 01/13/2016.  Copy of ICM check sent to cardiologist and device physician.   ICM trend: 12/10/2015       Allison Billings, RN 12/10/2015 3:18 PM

## 2016-01-04 ENCOUNTER — Telehealth: Payer: Self-pay | Admitting: Internal Medicine

## 2016-01-04 NOTE — Telephone Encounter (Signed)
°  New Prob   Pt has some questions regarding her defibrillator and who the manufacturer is. Please call.

## 2016-01-04 NOTE — Telephone Encounter (Signed)
Called pt back and let her know that her [employer] was the manufacture of her defibrillator , pt stated that she had seen something on the news about defibrillators but couldn't remember what it was. Pt wanted to know if her defibrillator was working. Informed pt that there were no alerts on her transmission from 12/10/2015. Assured pt that the clinic would get an alert from her home monitor. Informed pt that she was due to see Dr. Caryl Comes and that a scheduler would be calling to make an apt. Pt voiced understanding.

## 2016-01-13 ENCOUNTER — Telehealth: Payer: Self-pay | Admitting: Cardiology

## 2016-01-13 ENCOUNTER — Ambulatory Visit (INDEPENDENT_AMBULATORY_CARE_PROVIDER_SITE_OTHER): Payer: Medicare FFS

## 2016-01-13 DIAGNOSIS — I5022 Chronic systolic (congestive) heart failure: Secondary | ICD-10-CM | POA: Diagnosis not present

## 2016-01-13 DIAGNOSIS — Z95810 Presence of automatic (implantable) cardiac defibrillator: Secondary | ICD-10-CM | POA: Diagnosis not present

## 2016-01-13 NOTE — Telephone Encounter (Signed)
Spoke with pt and reminded pt of remote transmission that is due today. Pt verbalized understanding.   

## 2016-01-14 NOTE — Progress Notes (Signed)
EPIC Encounter for ICM Monitoring  Patient Name: [name] is a 75 y.o. female Date: 01/14/2016 Primary Care Physican: Nicola Girt, DO Primary [place] Electrophysiologist: Caryl Comes Dry Weight:unknown  Bi-V Pacing: >99%        Heart Failure questions reviewed, pt asymptomatic   Thoracic impedance above baseline suggesting dryness but returned to reference line 01/12/2016.  Recommendations:  No changes.  Advised to limit salt intake to 2000 mg daily.  Encouraged to call for fluid symptoms.    Follow-up plan: ICM clinic phone appointment on 02/16/2016.  Copy of ICM check sent to device physician.   ICM trend: 01/13/2016       [name], RN 01/14/2016 9:02 AM

## 2016-02-09 ENCOUNTER — Telehealth: Payer: Self-pay | Admitting: Cardiology

## 2016-02-09 NOTE — Telephone Encounter (Signed)
Spoke w/ pt and requested that she send a manual transmission b/c her home monitor has not updated in at least 14 days.   

## 2016-02-16 ENCOUNTER — Telehealth: Payer: Self-pay | Admitting: Cardiology

## 2016-02-16 ENCOUNTER — Ambulatory Visit (INDEPENDENT_AMBULATORY_CARE_PROVIDER_SITE_OTHER): Payer: Medicare FFS

## 2016-02-16 DIAGNOSIS — I5022 Chronic systolic (congestive) heart failure: Secondary | ICD-10-CM

## 2016-02-16 DIAGNOSIS — Z95810 Presence of automatic (implantable) cardiac defibrillator: Secondary | ICD-10-CM | POA: Diagnosis not present

## 2016-02-16 NOTE — Telephone Encounter (Signed)
Spoke with pt and reminded pt of remote transmission that is due today. Pt verbalized understanding.   

## 2016-02-19 NOTE — Progress Notes (Signed)
EPIC Encounter for ICM Monitoring  Patient Name: [name] is a 75 y.o. female Date: 02/19/2016 Primary Care Physican: Nicola Girt, DO Primary [place] Electrophysiologist: Caryl Comes Dry Weight:unknown Bi-V Pacing: >99%                Heart Failure questions reviewed, pt asymptomatic   Thoracic impedance normal   Recommendations: No changes.  Reinforced to limit low salt food choices to 2000 mg day and limiting fluid intake to < 2 liters per day. Encouraged to call for fluid symptoms.  ICM direct number given  Follow-up plan: ICM clinic phone appointment on 04/14/2016.  Office appointment with Dr Caryl Comes 03/14/2016  Copy of ICM check sent to device physician.   ICM trend: 02/17/2016       [name], RN 02/19/2016 8:37 AM

## 2016-03-14 ENCOUNTER — Ambulatory Visit (INDEPENDENT_AMBULATORY_CARE_PROVIDER_SITE_OTHER): Payer: Medicare FFS | Admitting: Internal Medicine

## 2016-03-14 ENCOUNTER — Encounter: Payer: Self-pay | Admitting: Internal Medicine

## 2016-03-14 VITALS — BP 130/70 | HR 87 | Ht 66.0 in | Wt 203.2 lb

## 2016-03-14 DIAGNOSIS — I5022 Chronic systolic (congestive) heart failure: Secondary | ICD-10-CM

## 2016-03-14 DIAGNOSIS — I428 Other cardiomyopathies: Secondary | ICD-10-CM | POA: Diagnosis not present

## 2016-03-14 DIAGNOSIS — I10 Essential (primary) hypertension: Secondary | ICD-10-CM | POA: Diagnosis not present

## 2016-03-14 DIAGNOSIS — Z95810 Presence of automatic (implantable) cardiac defibrillator: Secondary | ICD-10-CM | POA: Diagnosis not present

## 2016-03-14 DIAGNOSIS — I951 Orthostatic hypotension: Secondary | ICD-10-CM

## 2016-03-14 NOTE — Patient Instructions (Addendum)
Medication Instructions: - Your physician recommends that you continue on your current medications as directed. Please refer to the Current Medication list given to you today.  Labwork: - none ordered  Procedures/Testing: - none ordered  Follow-Up: - Remote monitoring is used to monitor your Pacemaker of ICD from home. This monitoring reduces the number of office visits required to check your device to one time per year. It allows [place] to keep an eye on the functioning of your device to ensure it is working properly. You are scheduled for a device check from home on 06/13/16. You may send your transmission at any time that day. If you have a wireless device, the transmission will be sent automatically. After your physician reviews your transmission, you will receive a postcard with your next transmission date.  - Your physician wants you to follow-up in: 1 year with Chanetta Marshall, NP for Dr. Caryl Comes. You will receive a reminder letter in the mail two months in advance. If you don't receive a letter, please call our office to schedule the follow-up appointment.  Any Additional Special Instructions Will Be Listed Below (If Applicable).     If you need a refill on your cardiac medications before your next appointment, please call your pharmacy.

## 2016-03-14 NOTE — Progress Notes (Signed)
Patient Care Team: Nicola Girt, DO as PCP - General   HPI  [name] is a 76 y.o. female Seen in follow-up for CRT- ICD implanted 4/16 for ischemic cardiomyopathy congestive heart failure and left bundle branch block. She also has a history of prior syncope.  LV function improved post implant from 20-25-->>30-35% 8/16.  She has hx of prior stent remotely.  Dr Hiram Comber aware of antiplatlet therapy 8/17 ECHO EF 50-55  Her dyspnea is much improved. But still has some dyspnea on exertion  . She denies peripheral edema.  No recurrent falls following standing   Records and Results [place] and outside office notes  Past Medical History:  Diagnosis Date  . AICD (automatic cardioverter/defibrillator) present    [place] [id number] ICD serial [phone]  . Anemia    as teenager  . Anxiety   . Arthritis   . Biventricular ICD (implantable cardioverter-defibrillator) in place   . Chronic pain    a. Prior h/o chronic pain on methadone.  . Chronic systolic CHF (congestive heart failure) (HCC)    a. mixed ischemic/non-ischemic cardiomyopathy. Varying EF over the years but most recently 20%, s/p CRT-D in 06/2014.  Marland Kitchen CKD (chronic kidney disease), stage III   . Complication of anesthesia    hard time waking her up from general surgery  . Coronary artery disease    a. remote RCA stenting in 2008 with non-DES. b. Cath 06/2014 following abnormal nuc: Stable, unchanged from prior cath, patent stent and 40% LM.  . Diabetes mellitus ([place])   . GERD (gastroesophageal reflux disease)   . Headache   . Hyperlipidemia   . Hypertension   . LBBB (left bundle branch block)   . Nonischemic cardiomyopathy ([place])   . OSA (obstructive sleep apnea)    AHI-9.77/hr, during REM-50.32/hr  . Syncope    2D ECHO, 08/21/2009 - EF 45-50%, normal (but later EFs abnormal)  . Syncope and collapse    CAROTID DOPPLER, 08/21/2009 - no significant stenosis demonstrated  . Tobacco abuse     Past Surgical  History:  Procedure Laterality Date  . BACK SURGERY  2013  . BI-VENTRICULAR IMPLANTABLE CARDIOVERTER DEFIBRILLATOR N/A 06/11/2014   Procedure: BI-VENTRICULAR IMPLANTABLE CARDIOVERTER DEFIBRILLATOR  (CRT-D);  Surgeon: Deboraha Sprang, MD; [facility] [phone] ICD serial [phone]   . CARDIAC CATHETERIZATION  03/31/2010   Moderate-noncritical CAD  . CARDIAC CATHETERIZATION  05/04/2007   Recommended medical therapy  . CARDIAC CATHETERIZATION  12/14/2006   Recommended stenting of RCA  . CARDIAC CATHETERIZATION  12/14/2006   RCA stented with a 3.0 [employer] Liberte stent resulting in a reduction of 75% to 0% residual  . CARDIAC CATHETERIZATION  05/14/2004   Recommending medical therapy  . CARDIAC CATHETERIZATION  08/26/1999   No intervention  . CHOLECYSTECTOMY     20 years ago  . COLONOSCOPY WITH PROPOFOL N/A 12/15/2014   Procedure: COLONOSCOPY WITH PROPOFOL;  Surgeon: Clarene Essex, MD;  Location: [facility] ENDOSCOPY;  Service: Endoscopy;  Laterality: N/A;  . EYE SURGERY     bilateral cataract surgery   . LEFT AND RIGHT HEART CATHETERIZATION WITH CORONARY ANGIOGRAM N/A 05/29/2014   Procedure: LEFT AND RIGHT HEART CATHETERIZATION WITH CORONARY ANGIOGRAM;  Surgeon: Lorretta Harp, MD;  Location: [facility] CATH LAB;  Service: Cardiovascular;  Laterality: N/A;  . LEFT HEART CATHETERIZATION WITH CORONARY ANGIOGRAM N/A 12/27/2012   Procedure: LEFT HEART CATHETERIZATION WITH CORONARY ANGIOGRAM;  Surgeon: Lorretta Harp, MD;  Location:  [facility] CATH LAB;  Service: Cardiovascular;  Laterality: N/A;    Current Outpatient Prescriptions  Medication Sig Dispense Refill  . ACCU-CHEK AVIVA PLUS test strip 1 each by Other route as needed (glucose).     Marland Kitchen aspirin EC 81 MG tablet Take 81 mg by mouth at bedtime.    . carvedilol (COREG) 12.5 MG tablet Take 1.5 tablets (18.75 mg total) by mouth 2 (two) times daily with a meal. 540 tablet 3  . citalopram (CELEXA) 40 MG tablet Take 40 mg by mouth daily.    . clopidogrel (PLAVIX) 75 MG  tablet TAKE 1 TABLET EVERY DAY 90 tablet 3  . furosemide (LASIX) 40 MG tablet Take 1 tablet (40 mg total) by mouth as directed. 80 MG IN THE AM; 40 MG IN THE PM 270 tablet 3  . glipiZIDE (GLUCOTROL) 10 MG tablet Take 10 mg by mouth 3 (three) times daily.     . insulin detemir (LEVEMIR) 100 UNIT/ML injection Inject 24 Units into the skin daily.    Marland Kitchen levothyroxine (SYNTHROID, LEVOTHROID) 112 MCG tablet Take 112 mcg by mouth at bedtime.     Marland Kitchen lisinopril (PRINIVIL,ZESTRIL) 2.5 MG tablet Take 1 tablet (2.5 mg total) by mouth daily. 90 tablet 3  . nitroGLYCERIN (NITROSTAT) 0.4 MG SL tablet Place 1 tablet (0.4 mg total) under the tongue every 5 (five) minutes as needed for chest pain. 25 tablet 3  . nortriptyline (PAMELOR) 50 MG capsule Take 50 mg by mouth at bedtime.     Marland Kitchen oxyCODONE-acetaminophen (PERCOCET/ROXICET) 5-325 MG tablet Take 1 tablet by mouth every 8 (eight) hours as needed for severe pain.    . pantoprazole (PROTONIX) 40 MG tablet Take 40 mg by mouth 2 (two) times daily.     . potassium chloride 20 MEQ TBCR Take 20 mEq by mouth daily. 90 tablet 3  . simvastatin (ZOCOR) 20 MG tablet Take 20 mg by mouth daily with supper.     . Vitamin D, Ergocalciferol, (DRISDOL) 50000 units CAPS capsule Take 50,000 Units by mouth daily. Reported on 07/09/2015     No current facility-administered medications for this visit.     Allergies  Allergen Reactions  . Valium [Diazepam] Swelling    face      Review of Systems negative except from HPI and PMH  Physical Exam BP 130/70   Pulse 87   Ht 5\' 6"  (1.676 m)   Wt 203 lb 3.2 oz (92.2 kg)   SpO2 97%   BMI 32.80 kg/m  Well developed and well nourished in no acute distress HENT normal E scleral and icterus clear Neck Supple JVP flat; carotids brisk and full Clear to ausculation Device pocket well healed; without hematoma or erythema.  There is no tethering  soft with active bowel sounds No clubbing cyanosis  Edema Alert and oriented, grossly  normal motor and sensory function Skin Warm and Dry  ECG demonstrates P synchronous pacing at 87 Intervals 23/11/37 QRS upright V1 negative lead 1    Assessment and  Plan Ischemic cardiomyopathy with interval normalization of LV function   Congestive heart failure-chronic-diastolic  Hypertension  Orthostatic hypotension  CRT-D-[employer]  She is better Continue current meds Without symptoms of ischemia Euvolemic continue current meds BP well controlled   Defer decision re DAPT to Dr Hiram Comber

## 2016-03-16 LAB — CUP PACEART INCLINIC DEVICE CHECK
Date Time Interrogation Session: 20180110090454
Implantable Lead Implant Date: 20160406
Implantable Lead Implant Date: 20160406
Implantable Lead Implant Date: 20160406
Implantable Lead Location: 753858
Implantable Lead Location: 753859
Implantable Lead Location: 753860
Implantable Lead Model: 7122
Implantable Pulse Generator Implant Date: 20160406
Pulse Gen Serial Number: 7199559

## 2016-03-25 ENCOUNTER — Other Ambulatory Visit: Payer: Self-pay | Admitting: Physician Assistant

## 2016-03-25 DIAGNOSIS — I2510 Atherosclerotic heart disease of native coronary artery without angina pectoris: Secondary | ICD-10-CM

## 2016-03-25 NOTE — Telephone Encounter (Signed)
Rx refill sent to pharmacy. 

## 2016-04-14 ENCOUNTER — Telehealth: Payer: Self-pay | Admitting: Cardiology

## 2016-04-14 NOTE — Telephone Encounter (Signed)
Spoke with pt and reminded pt of remote transmission that is due today. Pt verbalized understanding.   

## 2016-04-21 NOTE — Progress Notes (Signed)
No ICM remote transmission received for 04/14/2016 and next ICM transmission scheduled for 05/05/2016.

## 2016-05-05 ENCOUNTER — Ambulatory Visit (INDEPENDENT_AMBULATORY_CARE_PROVIDER_SITE_OTHER): Payer: Medicare FFS

## 2016-05-05 ENCOUNTER — Telehealth: Payer: Self-pay | Admitting: Cardiology

## 2016-05-05 DIAGNOSIS — I5022 Chronic systolic (congestive) heart failure: Secondary | ICD-10-CM

## 2016-05-05 DIAGNOSIS — Z95810 Presence of automatic (implantable) cardiac defibrillator: Secondary | ICD-10-CM

## 2016-05-05 NOTE — Telephone Encounter (Signed)
Spoke with pt and reminded pt of remote transmission that is due today. Pt verbalized understanding.   

## 2016-05-06 NOTE — Progress Notes (Signed)
EPIC Encounter for ICM Monitoring  Patient Name: [name] is a 76 y.o. female Date: 05/06/2016 Primary Care Physican: Nicola Girt, DO Primary [place] Electrophysiologist: Caryl Comes Dry Weight:unknown Bi-V Pacing: 98%      Attempted call to patient and unable to reach. Transmission reviewed.    Thoracic impedance normal.  Prescribed dosage: Furosemide 40 mg 2 tablets (80 mg total) every am and 1 tablet (40 mg total) every pm.  Potassium 20 mEq 1 tablet daily.  Labs: 05/26/2015 Creatinine 1.36, BUN 17, Potassium 4.5, Sodium 140  Recommendations:  NONE - Unable to reach patient   Follow-up plan: ICM clinic phone appointment on 06/06/2016.  Copy of ICM check sent to device physician.   3 month ICM trend: 05/05/2016   1 Year ICM trend:      [name], RN 05/06/2016 11:20 AM

## 2016-05-12 ENCOUNTER — Other Ambulatory Visit: Payer: Self-pay | Admitting: Neurological Surgery

## 2016-05-12 ENCOUNTER — Other Ambulatory Visit (HOSPITAL_COMMUNITY): Payer: Self-pay | Admitting: Neurological Surgery

## 2016-05-12 DIAGNOSIS — M48062 Spinal stenosis, lumbar region with neurogenic claudication: Secondary | ICD-10-CM

## 2016-05-19 ENCOUNTER — Ambulatory Visit (HOSPITAL_COMMUNITY)
Admission: RE | Admit: 2016-05-19 | Discharge: 2016-05-19 | Disposition: A | Discharge status: Home / Self Care | Payer: Medicare FFS | Source: Ambulatory Visit | Attending: Neurological Surgery | Admitting: Neurological Surgery

## 2016-05-19 ENCOUNTER — Ambulatory Visit (HOSPITAL_COMMUNITY): Payer: Medicare FFS

## 2016-05-20 ENCOUNTER — Telehealth: Payer: Self-pay | Admitting: Cardiovascular Disease

## 2016-05-23 ENCOUNTER — Telehealth: Payer: Self-pay | Admitting: Cardiovascular Disease

## 2016-05-23 NOTE — Telephone Encounter (Signed)
Request for surgical clearance:  1. What type of surgery is being performed?Mylogram  2. When is this surgery scheduled? 05/25/16   3. Are there any medications that need to be held prior to surgery and how long?Plavix  How long    4. Name of physician performing surgery? Dr.Elsner    5. What is your office phone and fax number? Ph# [phone], fax [phone]

## 2016-05-23 NOTE — Telephone Encounter (Signed)
Requesting medical clearance:  1. Type of procedure:  Lumbar Myelogram/CT  2. Surgeon: Dr. Kristeen Miss  3.Surgical Date: 05/25/16  4. Medications that need to be held: Plavix and ASA 81   5. CAD: Yes  6. I will defer to:  Dr. Pearla Dubonnet Information:  [facility] Phone:  Not provided Fax:  [phone]

## 2016-05-25 ENCOUNTER — Ambulatory Visit (HOSPITAL_COMMUNITY)
Admission: RE | Admit: 2016-05-25 | Discharge: 2016-05-25 | Disposition: A | Discharge status: Home / Self Care | Payer: Medicare FFS | Source: Ambulatory Visit | Attending: Neurological Surgery | Admitting: Neurological Surgery

## 2016-05-25 DIAGNOSIS — M48062 Spinal stenosis, lumbar region with neurogenic claudication: Secondary | ICD-10-CM

## 2016-05-25 LAB — GLUCOSE, CAPILLARY: Glucose-Capillary: 284 mg/dL — ABNORMAL HIGH (ref 65–99)

## 2016-05-25 MED ORDER — IOPAMIDOL (ISOVUE-M 200) INJECTION 41%
INTRAMUSCULAR | Status: AC
  Filled 2016-05-25: qty 10

## 2016-05-25 MED ORDER — OXYCODONE-ACETAMINOPHEN 5-325 MG PO TABS
1.0000 | ORAL_TABLET | ORAL | Status: DC | PRN
  Filled 2016-05-25: qty 2

## 2016-05-25 MED ORDER — LIDOCAINE HCL 1 % IJ SOLN
INTRAMUSCULAR | Status: AC
  Filled 2016-05-25: qty 10

## 2016-05-25 MED ORDER — LIDOCAINE HCL (PF) 1 % IJ SOLN
5.0000 mL | Freq: Once | INTRAMUSCULAR | Status: AC
  Administered 2016-05-25: 5 mL via INTRADERMAL

## 2016-05-25 MED ORDER — IOPAMIDOL (ISOVUE-M 200) INJECTION 41%
20.0000 mL | Freq: Once | INTRAMUSCULAR | Status: AC
  Administered 2016-05-25: 12 mL via INTRATHECAL

## 2016-05-25 MED ORDER — ONDANSETRON HCL 4 MG/2ML IJ SOLN: 4.0000 mg | Freq: Four times a day (QID) | INTRAMUSCULAR | Status: DC | PRN

## 2016-05-25 NOTE — Discharge Instructions (Signed)
Myelogram, Care After °These instructions give you information about caring for yourself after your procedure. Your doctor may also give you more specific instructions. Call your doctor if you have any problems or questions after your procedure. °Follow these instructions at home: °· Drink enough fluid to keep your pee (urine) clear or pale yellow. °· Rest as told by your doctor. °· Lie flat with your head slightly raised (elevated). °· Do not bend, lift, or do any hard activities for 24-48 hours or as told by your doctor. °· Take over-the-counter and prescription medicines only as told by your doctor. °· Take care of and remove your bandage (dressing) as told by your doctor. °· Bathe or shower as told by your doctor. °Contact a health care provider if: °· You have a fever. °· You have a headache that lasts longer than 24 hours. °· You feel sick to your stomach (nauseous). °· You throw up (vomit). °· Your neck is stiff. °· Your legs feel numb. °· You cannot pee. °· You cannot poop (have a bowel movement). °· You have a rash. °· You are itchy or sneezing. °Get help right away if: °· You have new symptoms or your symptoms get worse. °· You have a seizure. °· You have trouble breathing. °This information is not intended to replace advice given to you by your health care provider. Make sure you discuss any questions you have with your health care provider. °Document Released: 12/01/2007 Document Revised: 10/22/2015 Document Reviewed: 12/04/2014 °Elsevier Interactive Patient Education © 2017 [employer] ° °

## 2016-05-25 NOTE — Procedures (Signed)
Emberlynn Zachow is a 76 year old individual who has had a previous L2-L3 decompression and fusion. There is some concern that she had some adjacent level disease a few years back however this was not very severe. She was noting that her ability to walk any distances worsening in pain is worsening considerably in her back and her legs such that symptoms of neurogenic claudication prevented her from walking more than 40 or 50 feet at a time. Because of her concerns in previous evaluation a suggesting a myelogram and post myelogram CAT scan.  Pre op Dx: Lumbar spondylosis and stenosis history of fusion L2-3 Post op Dx: Same Procedure: Lumbar myelogram Surgeon: Brolin Dambrosia Puncture level: L1-2 Fluid color: Clear colorless Injection: Isovue-200, 12 mL Findings: Moderate diffuse spondylosis without any severe areas of stenosis noted on myelography. For further evaluation with CT scanning.

## 2016-05-26 NOTE — Telephone Encounter (Signed)
Spoke with pt states that she had procedure yesterday and she stopped plavix and will call Dr Ellene Route to discuss medications post procedure

## 2016-05-26 NOTE — Telephone Encounter (Signed)
OK to interrupt anti platelet Rx for Myelogram. Need to hold for 7 days prior   JJB

## 2016-05-27 NOTE — Telephone Encounter (Signed)
Okay to hold antiplatelet therapy for myelogram.

## 2016-05-30 DIAGNOSIS — R825 Elevated urine levels of drugs, medicaments and biological substances: Secondary | ICD-10-CM | POA: Insufficient documentation

## 2016-05-30 DIAGNOSIS — Z955 Presence of coronary angioplasty implant and graft: Secondary | ICD-10-CM | POA: Insufficient documentation

## 2016-05-31 NOTE — Telephone Encounter (Signed)
Routed to number provided via EPIC. 

## 2016-06-02 ENCOUNTER — Other Ambulatory Visit: Payer: Self-pay | Admitting: Cardiovascular Disease

## 2016-06-02 ENCOUNTER — Other Ambulatory Visit: Payer: Self-pay | Admitting: Physician Assistant

## 2016-06-03 NOTE — Telephone Encounter (Signed)
Rx request sent to pharmacy.  

## 2016-06-06 ENCOUNTER — Telehealth: Payer: Self-pay | Admitting: Cardiology

## 2016-06-06 NOTE — Telephone Encounter (Signed)
Spoke with pt and reminded pt of remote transmission that is due today. Pt verbalized understanding.   

## 2016-06-09 DIAGNOSIS — Z8521 Personal history of malignant neoplasm of larynx: Secondary | ICD-10-CM | POA: Insufficient documentation

## 2016-06-09 NOTE — Progress Notes (Signed)
No ICM remote transmission received for 06/06/2016 and next ICM transmission scheduled for 06/28/2016.

## 2016-06-15 ENCOUNTER — Telehealth: Payer: Self-pay | Admitting: Cardiovascular Disease

## 2016-06-15 DIAGNOSIS — E118 Type 2 diabetes mellitus with unspecified complications: Secondary | ICD-10-CM

## 2016-06-15 NOTE — Telephone Encounter (Signed)
Pt says she needs a referral to another doctor. She wants Dr Gwenlyn Found to refer her,,please call she will give you the details.

## 2016-06-15 NOTE — Telephone Encounter (Signed)
That is fine with me. Please make the referral

## 2016-06-15 NOTE — Telephone Encounter (Signed)
Spoke with pt she states that she is out of control with her blood sugars and would like Dr Gwenlyn Found to refer her to Dr. Philemon Kingdom, MD [phone], [place], [place], [place] [address].

## 2016-06-17 NOTE — Telephone Encounter (Signed)
REFERRAL ENTERED

## 2016-06-20 ENCOUNTER — Telehealth: Payer: Self-pay

## 2016-06-20 ENCOUNTER — Ambulatory Visit (INDEPENDENT_AMBULATORY_CARE_PROVIDER_SITE_OTHER): Payer: Medicare FFS

## 2016-06-20 DIAGNOSIS — Z95810 Presence of automatic (implantable) cardiac defibrillator: Secondary | ICD-10-CM | POA: Diagnosis not present

## 2016-06-20 DIAGNOSIS — I5022 Chronic systolic (congestive) heart failure: Secondary | ICD-10-CM

## 2016-06-20 NOTE — Progress Notes (Signed)
EPIC Encounter for ICM Monitoring  Patient Name: Alieah Brinton Renfrew is a 76 y.o. female Date: 06/20/2016 Primary Care Physican: Nicola Girt, DO Primary [place] Electrophysiologist: Caryl Comes Dry Weight:unknown Bi-V Pacing: 99%      Attempted call to patient and unable to reach.  Transmission reviewed.    Thoracic impedance normal but was abnormal suggesting fluid from 05/25/2016 to 06/04/2016.    Prescribed dosage: Furosemide 40 mg 2 tablets (80 mg total) every am and 1 tablet (40 mg total) every pm.  Potassium 20 mEq 1 tablet daily.  Labs: 05/26/2015 Creatinine 1.36, BUN 17, Potassium 4.5, Sodium 140  Recommendations: NONE - Unable to reach patient   Follow-up plan: ICM clinic phone appointment on 07/21/2016.   Copy of ICM check sent to device physician.   3 month ICM trend: 06/18/2016   1 Year ICM trend:      [name], RN 06/20/2016 10:40 AM

## 2016-06-20 NOTE — Telephone Encounter (Signed)
Remote ICM transmission received.  Attempted patient call and no voice mail set up.

## 2016-06-27 ENCOUNTER — Ambulatory Visit (INDEPENDENT_AMBULATORY_CARE_PROVIDER_SITE_OTHER): Payer: Medicare FFS | Admitting: *Deleted

## 2016-06-27 ENCOUNTER — Telehealth: Payer: Self-pay | Admitting: Internal Medicine

## 2016-06-27 DIAGNOSIS — I428 Other cardiomyopathies: Secondary | ICD-10-CM

## 2016-06-27 NOTE — Telephone Encounter (Signed)
Spoke with patient who reports that she has been working around the house really hard and felt like her device had moved. She stated that she thought maybe it had moved down about an inch but she hadn't really been paying much attention. I explained how the device is in a pock and that there is a bit of room for the device to move which is fairly common. She is going to send a remote transmission for me to check the function of the device. I will call her back if I see anything abnormal. She is agreeable to this plan.

## 2016-06-27 NOTE — Telephone Encounter (Signed)
New Message:   Pt says her Defibrillator looks like it have slipped out of place.

## 2016-06-28 NOTE — Progress Notes (Signed)
Remote ICD transmission.   

## 2016-06-28 NOTE — Telephone Encounter (Signed)
No abnormalities to report

## 2016-06-28 NOTE — Telephone Encounter (Signed)
Reviewed transmission

## 2016-06-30 ENCOUNTER — Encounter: Payer: Self-pay | Admitting: Cardiology

## 2016-06-30 LAB — CUP PACEART REMOTE DEVICE CHECK
Battery Remaining Longevity: 60 mo
Battery Remaining Percentage: 68 %
Battery Voltage: 2.95 V
Brady Statistic AP VP Percent: 3.1 %
Brady Statistic AP VS Percent: 1 %
Brady Statistic AS VP Percent: 96 %
Brady Statistic AS VS Percent: 1 %
Brady Statistic RA Percent Paced: 3 %
Date Time Interrogation Session: 20180423215419
HighPow Impedance: 71 Ohm
HighPow Impedance: 71 Ohm
Implantable Lead Implant Date: 20160406
Implantable Lead Implant Date: 20160406
Implantable Lead Implant Date: 20160406
Implantable Lead Location: 753858
Implantable Lead Location: 753859
Implantable Lead Location: 753860
Implantable Lead Model: 7122
Implantable Pulse Generator Implant Date: 20160406
Lead Channel Impedance Value: 1200 Ohm
Lead Channel Impedance Value: 380 Ohm
Lead Channel Impedance Value: 630 Ohm
Lead Channel Pacing Threshold Amplitude: 0.625 V
Lead Channel Pacing Threshold Amplitude: 0.875 V
Lead Channel Pacing Threshold Amplitude: 1.125 V
Lead Channel Pacing Threshold Pulse Width: 0.5 ms
Lead Channel Pacing Threshold Pulse Width: 0.5 ms
Lead Channel Pacing Threshold Pulse Width: 0.5 ms
Lead Channel Sensing Intrinsic Amplitude: 12 mV
Lead Channel Sensing Intrinsic Amplitude: 5 mV
Lead Channel Setting Pacing Amplitude: 1.875
Lead Channel Setting Pacing Amplitude: 2 V
Lead Channel Setting Pacing Amplitude: 2.125
Lead Channel Setting Pacing Pulse Width: 0.5 ms
Lead Channel Setting Pacing Pulse Width: 0.5 ms
Lead Channel Setting Sensing Sensitivity: 0.5 mV
Pulse Gen Serial Number: 7199559

## 2016-07-21 ENCOUNTER — Ambulatory Visit (INDEPENDENT_AMBULATORY_CARE_PROVIDER_SITE_OTHER): Payer: Medicare FFS

## 2016-07-21 DIAGNOSIS — Z95810 Presence of automatic (implantable) cardiac defibrillator: Secondary | ICD-10-CM

## 2016-07-21 DIAGNOSIS — I5022 Chronic systolic (congestive) heart failure: Secondary | ICD-10-CM

## 2016-07-21 NOTE — Progress Notes (Signed)
EPIC Encounter for ICM Monitoring  Patient Name: [name] is a 76 y.o. female Date: 07/21/2016 Primary Care Physican: Nicola Girt, DO Primary [place] Electrophysiologist: Caryl Comes Dry Weight:unknown Bi-V Pacing: 99%               Heart Failure questions reviewed, pt was symptomatic with swelling of legs and exhausted but the swelling has improved.   Thoracic impedance appears normal but was abnormal suggesting fluid accumulation 06/15/2016 to 07/08/2016.  Impedance reference line has dropped.  Prescribed dosage: Furosemide 40 mg 2 tablets (80 mg total) every AM and 1 tablet (40 mg total) every PM. Potassium 20 mEq 1 tablet daily.  Labs: 06/09/2016 Creatinine 1.51, BUN 23, Potassium 4.5, Sodium 135                           Care Everywhere results 06/01/2016 Creatinine 1.07, BUN 5,   Potassium 3.7, Sodium 143, EGFR 50->60  Care Everywhere results 05/31/2016 Creatinine 1.13, BUN 8,   Potassium 3.4, Sodium 145, EGFR 47-57    Care Everywhere results 05/30/2016 Creatinine 1.25, BUN 12, Potassium 3.6, Sodium 137, EGFR 42-51    Care Everywhere results 05/05/2016 Creatinine 1.45, BUN 21, Potassium 4.5, Sodium 135                           Care Everywhere results 05/26/2015 Creatinine 1.36, BUN 17, Potassium 4.5, Sodium 140  Recommendations:  Patient does not follow low salt diet and has been adding sea salt to her foods.  She also says she is probably drinking for than 64 oz a day.  Advised not to use any extra salt at the table or cooking and to use more herbs to flavor foods. Advised salt limit is 2000 mg a day and to review the food labels to ensure she is staying with the limits.  Encouraged to call for fluid symptoms or use local ER for any urgent symptoms.  Follow-up plan: ICM clinic phone appointment on 08/02/2016 to recheck fluid levels.    Copy of ICM check sent to Dr Alvester Chou and Dr Caryl Comes for review and if any recommendations will call her back.     3 month ICM  trend: 07/21/2016   1 Year ICM trend:      [name], RN 07/21/2016 8:29 AM

## 2016-07-26 ENCOUNTER — Telehealth: Payer: Self-pay | Admitting: Cardiovascular Disease

## 2016-07-26 NOTE — Telephone Encounter (Signed)
No further recs

## 2016-07-26 NOTE — Telephone Encounter (Signed)
Returned call to patient-patient states she was told she had increased fluid levels and was wondering if she needed to change any medications at this time.    Per chart review: ICM monitoring on 5/17 indicated fluid accumulation-RN advised to monitor salt and fluid intake.  Follow up ICM on 5/29, copy was send to Dr. Gwenlyn Found and Dr. Caryl Comes for recommendations.  Patient denies SOB, CP, edema (mentioned leg swelling in ICM note, denies now).  Reports she is really watching her salt intake and fluid intake.  Advised to continue monitoring, will route to Dr. Gwenlyn Found to see if he has any recommendations, if not Rn will f/u on 5/29 to recheck.    Patient agreed with plan and verbalized understanding.  Advised to call if symptoms occur.

## 2016-07-26 NOTE — Telephone Encounter (Signed)
New message    Pt states [facility] told her that she has too much fluid in her heart and and wants to know if there are any medication changes to help this. Requests call back

## 2016-07-30 ENCOUNTER — Other Ambulatory Visit: Payer: Self-pay | Admitting: Physician Assistant

## 2016-07-30 DIAGNOSIS — I2510 Atherosclerotic heart disease of native coronary artery without angina pectoris: Secondary | ICD-10-CM

## 2016-08-02 ENCOUNTER — Ambulatory Visit (INDEPENDENT_AMBULATORY_CARE_PROVIDER_SITE_OTHER): Payer: Medicare FFS

## 2016-08-02 ENCOUNTER — Telehealth: Payer: Self-pay | Admitting: Cardiology

## 2016-08-02 DIAGNOSIS — Z95810 Presence of automatic (implantable) cardiac defibrillator: Secondary | ICD-10-CM

## 2016-08-02 DIAGNOSIS — I5022 Chronic systolic (congestive) heart failure: Secondary | ICD-10-CM

## 2016-08-02 NOTE — Progress Notes (Signed)
EPIC Encounter for ICM Monitoring  Patient Name: [name] is a 76 y.o. female Date: 08/02/2016 Primary Care Physican: Nicola Girt, DO Primary [place] Electrophysiologist: Caryl Comes Dry Weight:unknown Bi-V Pacing: 99%     Heart Failure questions reviewed, pt asymptomatic    Thoracic impedance returned to normal and she reported limiting her fluid intake.  Prescribed dosage: Furosemide 40 mg 2 tablets (80 mg total) every AM and 1 tablet (40 mg total) every PM. Potassium 20 mEq 1 tablet daily.  Labs: 06/09/2016 Creatinine 1.51, BUN 23, Potassium 4.5, Sodium 135                           Care Everywhere results 06/01/2016 Creatinine 1.07, BUN 5,   Potassium 3.7, Sodium 143, EGFR 50->60  Care Everywhere results 05/31/2016 Creatinine 1.13, BUN 8,   Potassium 3.4, Sodium 145, EGFR 47-57    Care Everywhere results 05/30/2016 Creatinine 1.25, BUN 12, Potassium 3.6, Sodium 137, EGFR 42-51    Care Everywhere results 05/05/2016 Creatinine 1.45, BUN 21, Potassium 4.5, Sodium 135                           Care Everywhere results 05/26/2015 Creatinine 1.36, BUN 17, Potassium 4.5, Sodium 140  Recommendations: No changes. Advised to limit salt intake to 2000 mg/day and fluid intake to < 2 liters/day.  Encouraged to call for fluid symptoms or use local ER for any urgent symptoms.  Follow-up plan: ICM clinic phone appointment on 08/22/2016.    Copy of ICM check sent to primary cardiologist and device physician.   3 month ICM trend: 08/02/2016   1 Year ICM trend:      [name], RN 08/02/2016 1:20 PM

## 2016-08-02 NOTE — Telephone Encounter (Signed)
Spoke with pt and reminded pt of remote transmission that is due today. Pt verbalized understanding.   

## 2016-08-08 DIAGNOSIS — M4800 Spinal stenosis, site unspecified: Secondary | ICD-10-CM | POA: Insufficient documentation

## 2016-08-15 ENCOUNTER — Telehealth: Payer: Self-pay | Admitting: Internal Medicine

## 2016-08-15 NOTE — Telephone Encounter (Signed)
New message       Pt states that her defibrillator is slipping out of the pocket.  She want to come in wed or thurs to have someone to look at it.

## 2016-08-15 NOTE — Telephone Encounter (Signed)
Called pt back, pt stated that she felt like her ICD had slipped down farther and was able to see the wires, pt somewhat distraught over this, pt stated that I know I have been told this was normal but I would just feel better if someone looked at it, I have waited too long before calling about things ended up worse, I just don't want to take a chance with my heart. Informed pt that the only opening is Wednesday at 8:30am. PT agreeable  to this apt.

## 2016-08-15 NOTE — Telephone Encounter (Signed)
Pt called back and said she could not come on Wednesday at 8:30. Informed pt that is the only appointment we can accommodate this week and it would be next or the week after before we could accommodate another appt. Pt verbalized understanding and stated that she could come on 08-31-16. After consulting w/ Device Clinic RN offered pt an appt on 08-31-16 at 3:00 PM. Pt agreed to this dated and time.

## 2016-08-22 ENCOUNTER — Ambulatory Visit (INDEPENDENT_AMBULATORY_CARE_PROVIDER_SITE_OTHER): Payer: Medicare FFS

## 2016-08-22 ENCOUNTER — Telehealth: Payer: Self-pay

## 2016-08-22 DIAGNOSIS — I5022 Chronic systolic (congestive) heart failure: Secondary | ICD-10-CM

## 2016-08-22 DIAGNOSIS — Z95810 Presence of automatic (implantable) cardiac defibrillator: Secondary | ICD-10-CM

## 2016-08-22 NOTE — Progress Notes (Signed)
EPIC Encounter for ICM Monitoring  Patient Name: [name] is a 76 y.o. female Date: 08/22/2016 Primary Care Physican: Nicola Girt, DO Primary [place] Electrophysiologist: Caryl Comes Dry Weight:unknown Bi-V Pacing: 99%       Attempted call to patient and unable to reach.   Transmission reviewed.    Thoracic impedance normal.  Prescribed dosage: Furosemide 40 mg 2 tablets (80 mg total) every AMand 1 tablet (40 mg total) every PM. Potassium 20 mEq 1 tablet daily.  Labs: 06/09/2016 Creatinine 1.51, BUN 23, Potassium 4.5, Sodium 135 Care Everywhere results 06/01/2016 Creatinine 1.07, BUN 5, Potassium 3.7, Sodium 143, EGFR 50->60 Care Everywhere results 05/31/2016 Creatinine 1.13, BUN 8, Potassium 3.4, Sodium 145, EGFR 47-57 Care Everywhere results 05/30/2016 Creatinine 1.25, BUN 12, Potassium 3.6, Sodium 137, EGFR 42-51 Care Everywhere results 05/05/2016 Creatinine 1.45, BUN 21, Potassium 4.5, Sodium 135 Care Everywhere results 05/26/2015 Creatinine 1.36, BUN 17, Potassium 4.5, Sodium 140  Recommendations: NONE - Unable to reach patient   Follow-up plan: ICM clinic phone appointment on 09/26/2016.    Copy of ICM check sent to device physician.   3 month ICM trend: 08/22/2016   1 Year ICM trend:      [name], RN 08/22/2016 8:34 AM

## 2016-08-22 NOTE — Telephone Encounter (Signed)
Remote ICM transmission received.  Attempted patient call to both cell phone and home number.  Person answering phone was not with her and to call home number.  Call to home number and mail box is full.

## 2016-08-26 ENCOUNTER — Telehealth: Payer: Self-pay | Admitting: Cardiovascular Disease

## 2016-08-26 NOTE — Telephone Encounter (Signed)
Requesting surgical clearance:  1. Type of surgery: Bilateral L3-4 Transforaminal Injection  2. Surgeon: Dr. Kristeen Miss  3.Surgical Date:  08/26/16  4. Medications that need to be held: Plavix and ASA--1 week prior to injection if needed   5. CAD: Yes  6. I will defer to:  Dr. Pearla Dubonnet Information:  [facility] Fax:  [phone]

## 2016-08-28 NOTE — Telephone Encounter (Signed)
OK to interrupt anti platelet Rx 

## 2016-08-30 NOTE — Telephone Encounter (Signed)
Routed to number provided via EPIC. 

## 2016-08-31 ENCOUNTER — Encounter (HOSPITAL_COMMUNITY): Payer: Self-pay | Admitting: *Deleted

## 2016-08-31 ENCOUNTER — Emergency Department (HOSPITAL_COMMUNITY)
Admission: EM | Admit: 2016-08-31 | Discharge: 2016-08-31 | Disposition: A | Discharge status: Home / Self Care | Payer: Medicare FFS | Attending: Emergency Medicine | Admitting: Emergency Medicine

## 2016-08-31 DIAGNOSIS — G8929 Other chronic pain: Secondary | ICD-10-CM

## 2016-08-31 DIAGNOSIS — I130 Hypertensive heart and chronic kidney disease with heart failure and stage 1 through stage 4 chronic kidney disease, or unspecified chronic kidney disease: Secondary | ICD-10-CM | POA: Insufficient documentation

## 2016-08-31 DIAGNOSIS — M545 Low back pain, unspecified: Secondary | ICD-10-CM

## 2016-08-31 DIAGNOSIS — Z7984 Long term (current) use of oral hypoglycemic drugs: Secondary | ICD-10-CM | POA: Diagnosis not present

## 2016-08-31 DIAGNOSIS — Z7982 Long term (current) use of aspirin: Secondary | ICD-10-CM | POA: Diagnosis not present

## 2016-08-31 DIAGNOSIS — E1122 Type 2 diabetes mellitus with diabetic chronic kidney disease: Secondary | ICD-10-CM | POA: Insufficient documentation

## 2016-08-31 DIAGNOSIS — I5022 Chronic systolic (congestive) heart failure: Secondary | ICD-10-CM | POA: Insufficient documentation

## 2016-08-31 DIAGNOSIS — I2510 Atherosclerotic heart disease of native coronary artery without angina pectoris: Secondary | ICD-10-CM | POA: Diagnosis not present

## 2016-08-31 DIAGNOSIS — Z87891 Personal history of nicotine dependence: Secondary | ICD-10-CM | POA: Insufficient documentation

## 2016-08-31 DIAGNOSIS — N183 Chronic kidney disease, stage 3 (moderate): Secondary | ICD-10-CM | POA: Insufficient documentation

## 2016-08-31 LAB — COMPREHENSIVE METABOLIC PANEL
ALT: 18 U/L (ref 14–54)
AST: 27 U/L (ref 15–41)
Albumin: 3.5 g/dL (ref 3.5–5.0)
Alkaline Phosphatase: 100 U/L (ref 38–126)
Anion gap: 12 (ref 5–15)
BUN: 16 mg/dL (ref 6–20)
CO2: 27 mmol/L (ref 22–32)
Calcium: 9.9 mg/dL (ref 8.9–10.3)
Chloride: 98 mmol/L — ABNORMAL LOW (ref 101–111)
Creatinine, Ser: 1.4 mg/dL — ABNORMAL HIGH (ref 0.44–1.00)
GFR calc Af Amer: 42 mL/min — ABNORMAL LOW (ref >60)
GFR calc non Af Amer: 36 mL/min — ABNORMAL LOW (ref >60)
Glucose, Bld: 208 mg/dL — ABNORMAL HIGH (ref 65–99)
Potassium: 4.3 mmol/L (ref 3.5–5.1)
Sodium: 137 mmol/L (ref 135–145)
Total Bilirubin: 1 mg/dL (ref 0.3–1.2)
Total Protein: 6.7 g/dL (ref 6.5–8.1)

## 2016-08-31 LAB — CBC WITH DIFFERENTIAL/PLATELET
Basophils Absolute: 0 10*3/uL (ref 0.0–0.1)
Basophils Relative: 1 %
Eosinophils Absolute: 0.3 10*3/uL (ref 0.0–0.7)
Eosinophils Relative: 5 %
HCT: 37.5 % (ref 36.0–46.0)
Hemoglobin: 12.7 g/dL (ref 12.0–15.0)
Lymphocytes Relative: 18 %
Lymphs Abs: 1.1 10*3/uL (ref 0.7–4.0)
MCH: 29.9 pg (ref 26.0–34.0)
MCHC: 33.9 g/dL (ref 30.0–36.0)
MCV: 88.2 fL (ref 78.0–100.0)
Monocytes Absolute: 0.4 10*3/uL (ref 0.1–1.0)
Monocytes Relative: 7 %
Neutro Abs: 4.3 10*3/uL (ref 1.7–7.7)
Neutrophils Relative %: 69 %
Platelets: 282 10*3/uL (ref 150–400)
RBC: 4.25 MIL/uL (ref 3.87–5.11)
RDW: 13.3 % (ref 11.5–15.5)
WBC: 6.1 10*3/uL (ref 4.0–10.5)

## 2016-08-31 LAB — URINALYSIS, ROUTINE W REFLEX MICROSCOPIC
Bacteria, UA: NONE SEEN
Bilirubin Urine: NEGATIVE
Glucose, UA: NEGATIVE mg/dL
Hgb urine dipstick: NEGATIVE
Ketones, ur: NEGATIVE mg/dL
Nitrite: NEGATIVE
Protein, ur: NEGATIVE mg/dL
Specific Gravity, Urine: 1.009 (ref 1.005–1.030)
pH: 5 (ref 5.0–8.0)

## 2016-08-31 MED ORDER — LACTATED RINGERS IV BOLUS (SEPSIS)
1000.0000 mL | Freq: Once | INTRAVENOUS | Status: AC
  Administered 2016-08-31: 1000 mL via INTRAVENOUS

## 2016-08-31 MED ORDER — HYDROMORPHONE HCL 1 MG/ML IJ SOLN
1.0000 mg | Freq: Once | INTRAMUSCULAR | Status: AC
  Administered 2016-08-31: 1 mg via INTRAVENOUS
  Filled 2016-08-31: qty 1

## 2016-08-31 MED ORDER — METHOCARBAMOL 500 MG PO TABS: 500.0000 mg | ORAL_TABLET | Freq: Three times a day (TID) | ORAL | 0 refills | Status: DC | PRN

## 2016-08-31 MED ORDER — KETOROLAC TROMETHAMINE 30 MG/ML IJ SOLN
15.0000 mg | Freq: Once | INTRAMUSCULAR | Status: AC
  Administered 2016-08-31: 15 mg via INTRAVENOUS
  Filled 2016-08-31: qty 1

## 2016-08-31 MED ORDER — OXYCODONE-ACETAMINOPHEN 5-325 MG PO TABS
1.0000 | ORAL_TABLET | Freq: Once | ORAL | Status: AC
  Administered 2016-08-31: 1 via ORAL
  Filled 2016-08-31: qty 1

## 2016-08-31 MED ORDER — METHOCARBAMOL 1000 MG/10ML IJ SOLN
500.0000 mg | Freq: Once | INTRAMUSCULAR | Status: AC
  Administered 2016-08-31: 500 mg via INTRAMUSCULAR
  Filled 2016-08-31: qty 5

## 2016-08-31 NOTE — ED Notes (Signed)
Report given to [facility]

## 2016-08-31 NOTE — ED Triage Notes (Signed)
Patient is alert and oriented x4.  She is complaining of a chronic back and leg pain.  Patient is being seen by Dr. Jacqulyn Cane for shots in her back and she was unable to recently get the shots and the pain has gotten worse.  Currently she rates her pain 10 of 10.

## 2016-08-31 NOTE — ED Notes (Signed)
Rounding completed on patient. Patient awake and AxOx4. Patient blood pressure continues to improve- MAP remains >60. Patient Updated on POC. Physician stopped by patient room to check on patient status and updated patient on POC. Patient reminded of the order for UA and offered toileting. Patient refuses at this time. Will continue to monitor.

## 2016-08-31 NOTE — ED Provider Notes (Signed)
Blood pressure (!) 93/57, pulse (!) 59, temperature 97.4 F (36.3 C), temperature source Oral, resp. rate 14, height 5\' 6"  (1.676 m), weight 76.7 kg (169 lb), SpO2 100 %.  Assuming care from Dr. Dayna Barker.  In short, [name] is a 76 y.o. female with a chief complaint of Back Pain and Fatigue .  Refer to the original H&P for additional details.  The current plan of care is to follow vital signs after IVF.  04:40 PM Patient sitting up in bed with continued lower back pain. No abdominal tenderness to palpation. Patient with history of chronic back pain and states it feels very similar to her chronic back pain. No evidence to suggest vascular etiology of her pain. No indication for advanced imaging of her spine after my exam. Blood pressure continues to improve. No evidence of infection either by history or labs. Plan for follow-up after additional IV fluids. I discussed with the patient that I'm hesitant to give her any additional pain medication for fear of dropping her blood pressure further and she verbalizes understanding.  06:28 PM Patient with improved BP but worsening pain. Given PO pain medication and Robaxin. Will ambulate and plan for discharge with spine follow up.   07:15 PM Patient ambulated in the emergency department without difficulty. She has oxycodone at home which she will continue. Will discharge home with Robaxin but advised that she not take this with the Oxycodone.   At this time, I do not feel there is any life-threatening condition present. I have reviewed and discussed all results (EKG, imaging, lab, urine as appropriate), exam findings with patient. I have reviewed nursing notes and appropriate previous records.  I feel the patient is safe to be discharged home without further emergent workup. Discussed usual and customary return precautions. Patient and family (if present) verbalize understanding and are comfortable with this plan.  Patient will follow-up with their  primary care provider. If they do not have a primary care provider, information for follow-up has been provided to them. All questions have been answered.   Nanda Quinton, MD   Margette Fast, MD 08/31/16 [phone]

## 2016-08-31 NOTE — ED Notes (Signed)
Due to patient decreased blood pressure, rounding completed. Patient wakes easily and is alert and oriented x4. Patient remains hypotensive. Patient states her pain is "much better! Its not been this good in a long time!" Patient IV is positional and was repositioned to allow for fluid bolus to infuse faster. Updated patient and family on POC. MD to be made aware.

## 2016-08-31 NOTE — ED Notes (Signed)
Ultrasound nurse in patient room, attempting PIV access.

## 2016-08-31 NOTE — ED Notes (Addendum)
Rounding completed on patient. Patient remains hypotensive, but BP is slowly improving. Face to face updated Dr Dayna Barker. Will continue to monitor. No new orders. Will hold Robaxin until patient vital signs stabilize.

## 2016-08-31 NOTE — ED Notes (Signed)
Rounding completed. Patient continues to be hypotensive, but remains easy to arouse to voice and alert and oriented x4 when awake. Patient and husband updated on POC. Patient asked to try to remain awake to improve her BP. Fluid bolus still infusing at this time. Will continue to monitor.

## 2016-08-31 NOTE — ED Notes (Signed)
Patient ambulated in hall with standby assistance. Patient tolerated well. VSS

## 2016-08-31 NOTE — Discharge Instructions (Signed)

## 2016-08-31 NOTE — ED Provider Notes (Signed)
[facility] DEPT Provider Note   CSN: [id number] Arrival date & time: 08/31/16  0813     History   Chief Complaint Chief Complaint  Patient presents with  . Back Pain  . Fatigue    HPI [name] is a 76 y.o. female.   Back Pain   This is a chronic problem. The problem has been gradually worsening. The pain is associated with no known injury. The pain is present in the lumbar spine. The quality of the pain is described as stabbing and aching. The pain radiates to the left thigh. Pertinent negatives include no chest pain. She has tried nothing for the symptoms.    Past Medical History:  Diagnosis Date  . AICD (automatic cardioverter/defibrillator) present    [place] [id number] ICD serial [phone]  . Anemia    as teenager  . Anxiety   . Arthritis   . Biventricular ICD (implantable cardioverter-defibrillator) in place   . Chronic pain    a. Prior h/o chronic pain on methadone.  . Chronic systolic CHF (congestive heart failure) (HCC)    a. mixed ischemic/non-ischemic cardiomyopathy. Varying EF over the years but most recently 20%, s/p CRT-D in 06/2014.  Marland Kitchen CKD (chronic kidney disease), stage III   . Complication of anesthesia    hard time waking her up from general surgery  . Coronary artery disease    a. remote RCA stenting in 2008 with non-DES. b. Cath 06/2014 following abnormal nuc: Stable, unchanged from prior cath, patent stent and 40% LM.  . Diabetes mellitus ([place])   . GERD (gastroesophageal reflux disease)   . Headache   . Hyperlipidemia   . Hypertension   . LBBB (left bundle branch block)   . Nonischemic cardiomyopathy ([place])   . OSA (obstructive sleep apnea)    AHI-9.77/hr, during REM-50.32/hr  . Syncope    2D ECHO, 08/21/2009 - EF 45-50%, normal (but later EFs abnormal)  . Syncope and collapse    CAROTID DOPPLER, 08/21/2009 - no significant stenosis demonstrated  . Tobacco abuse     Patient Active Problem List   Diagnosis Date Noted  . Tachycardia  05/26/2015  . Syncope 05/26/2015  . ICD (implantable cardioverter-defibrillator), biventricular, in situ 05/26/2015  . Chronic kidney disease (CKD), stage III (moderate) 04/10/2015  . Chronic systolic heart failure ([place]) 04/10/2015  . CAD in native artery 04/10/2015  . Esophagitis, reflux 04/10/2015  . Avitaminosis D 04/10/2015  . Cough 03/10/2015  . Episode of syncope 03/10/2015  . Chronic pain   . CKD (chronic kidney disease), stage III   . Chronic systolic CHF (congestive heart failure) ([place])   . Diabetes mellitus ([place])   . Hypertension   . Coronary artery disease   . Acute on chronic systolic CHF (congestive heart failure), NYHA class 3 ([place]) 06/11/2014  . CAD (coronary artery disease) 05/29/2014  . Atherosclerosis of native coronary artery of native heart without angina pectoris   . Left ventricular dysfunction   . Dyspnea on exertion   . Chest pain 05/01/2013  . Hyperlipidemia 12/26/2012  . Diabetes ([place]) 12/26/2012  . Tobacco abuse 12/26/2012  . Left bundle branch block 12/26/2012  . Block, bundle branch, left 12/26/2012  . DYSPNEA 10/20/2008  . Coronary atherosclerosis 10/06/2008  . Essential hypertension 10/03/2008  . COPD 10/03/2008  . SLEEP APNEA 10/03/2008  . Chronic obstructive pulmonary disease ([place]) 10/03/2008    Past Surgical History:  Procedure Laterality Date  . BACK SURGERY  2013  . BI-VENTRICULAR IMPLANTABLE CARDIOVERTER DEFIBRILLATOR  N/A 06/11/2014   Procedure: BI-VENTRICULAR IMPLANTABLE CARDIOVERTER DEFIBRILLATOR  (CRT-D);  Surgeon: Deboraha Sprang, MD; [facility] [phone] ICD serial [phone]   . CARDIAC CATHETERIZATION  03/31/2010   Moderate-noncritical CAD  . CARDIAC CATHETERIZATION  05/04/2007   Recommended medical therapy  . CARDIAC CATHETERIZATION  12/14/2006   Recommended stenting of RCA  . CARDIAC CATHETERIZATION  12/14/2006   RCA stented with a 3.0 [employer] Liberte stent resulting in a reduction of 75% to 0% residual  . CARDIAC  CATHETERIZATION  05/14/2004   Recommending medical therapy  . CARDIAC CATHETERIZATION  08/26/1999   No intervention  . CHOLECYSTECTOMY     20 years ago  . COLONOSCOPY WITH PROPOFOL N/A 12/15/2014   Procedure: COLONOSCOPY WITH PROPOFOL;  Surgeon: Clarene Essex, MD;  Location: [facility] ENDOSCOPY;  Service: Endoscopy;  Laterality: N/A;  . EYE SURGERY     bilateral cataract surgery   . LEFT AND RIGHT HEART CATHETERIZATION WITH CORONARY ANGIOGRAM N/A 05/29/2014   Procedure: LEFT AND RIGHT HEART CATHETERIZATION WITH CORONARY ANGIOGRAM;  Surgeon: Lorretta Harp, MD;  Location: [facility] CATH LAB;  Service: Cardiovascular;  Laterality: N/A;  . LEFT HEART CATHETERIZATION WITH CORONARY ANGIOGRAM N/A 12/27/2012   Procedure: LEFT HEART CATHETERIZATION WITH CORONARY ANGIOGRAM;  Surgeon: Lorretta Harp, MD;  Location[facility] C[facility] LAB;  Service: Cardiovascular;  Laterality: N/A;    OB History    No data available       Home Medications    Prior to Admission medications   Medication Sig Start Date End Date Taking? Authorizing Provider  ACCU-CHEK AVIVA PLUS test strip 1 each by Other route as needed (glucose).  05/15/15   [provider]  aspirin EC 81 MG tablet Take 81 mg by mouth at bedtime.    [provider]  carvedilol (COREG) 12.5 MG tablet TAKE 1 AND 1/2 TABLETS TWICE DAILY WITH A MEAL  (DOSE  INCREASE) 08/02/16   Richardson Dopp T, PA-C  clopidogrel (PLAVIX) 75 MG tablet TAKE 1 TABLET EVERY DAY 12/08/15   Lorretta Harp, MD  furosemide (LASIX) 40 MG tablet TAKE 2 TABLETS EVERY MORNING  AND TAKE 1 TABLET EVERY EVENING 03/25/16   Deboraha Sprang, MD  glipiZIDE (GLUCOTROL) 10 MG tablet Take 10 mg by mouth 2 (two) times daily before a meal.  06/10/14   [provider]  levothyroxine (SYNTHROID, LEVOTHROID) 112 MCG tablet Take 112 mcg by mouth at bedtime.     [provider]  lisinopril (PRINIVIL,ZESTRIL) 2.5 MG tablet TAKE 1 TABLET EVERY DAY 06/03/16   Lorretta Harp, MD    nitroGLYCERIN (NITROSTAT) 0.4 MG SL tablet Place 1 tablet (0.4 mg total) under the tongue every 5 (five) minutes as needed for chest pain. 05/01/13   Brett Canales, PA-C  oxyCODONE-acetaminophen (PERCOCET/ROXICET) 5-325 MG tablet Take 1 tablet by mouth every 6 (six) hours as needed for severe pain.     [provider]  pantoprazole (PROTONIX) 40 MG tablet Take 40 mg by mouth 2 (two) times daily.  04/30/13   [provider]  potassium chloride 20 MEQ TBCR Take 20 mEq by mouth daily. 05/26/15   Richardson Dopp T, PA-C  potassium chloride SA (K-DUR,KLOR-CON) 20 MEQ tablet TAKE 1 TABLET EVERY DAY  (DOSE  INCREASE) 06/03/16   Lorretta Harp, MD  simvastatin (ZOCOR) 20 MG tablet Take 20 mg by mouth daily with supper.     [provider]    Family History Family History  Problem Relation Age  of Onset  . Stroke Mother   . Hypertension Mother   . Coronary artery disease Father   . Stroke Brother   . Heart disease Brother   . Other Brother        H1N1 VIRUS  . Healthy Sister   . Healthy Sister     Social History Social History  Substance Use Topics  . Smoking status: Former Smoker    Packs/day: 1.00    Years: 58.00    Types: E-cigarettes    Start date: 12/26/1952    Quit date: 12/05/2013  . Smokeless tobacco: Never Used     Comment: uses Vape cigarettes  . Alcohol use No     Allergies   Valium [diazepam]   Review of Systems Review of Systems  Constitutional: Positive for fatigue.  Cardiovascular: Negative for chest pain.  Endocrine: Positive for polyuria.  Musculoskeletal: Positive for back pain.  All other systems reviewed and are negative.    Physical Exam Updated Vital Signs BP 112/61 (BP Location: Right Arm)   Pulse 69   Temp 97.4 F (36.3 C) (Oral)   Resp 17   Ht 5\' 6"  (1.676 m)   Wt 76.7 kg (169 lb)   SpO2 95%   BMI 27.28 kg/m   Physical Exam  Constitutional: She appears well-developed and well-nourished.  HENT:  Head:  Normocephalic and atraumatic.  Eyes: Conjunctivae and EOM are normal.  Neck: Normal range of motion.  Cardiovascular: Normal rate and regular rhythm.   Pulmonary/Chest: Effort normal and breath sounds normal. No stridor. No respiratory distress.  Abdominal: Soft. She exhibits no distension.  Musculoskeletal: Normal range of motion. She exhibits tenderness (lumbar midline and paraspinal). She exhibits no deformity.  Neurological: She is alert.  Skin: Skin is warm and dry. No erythema. No pallor.  Nursing note and vitals reviewed.    ED Treatments / Results  Labs (all labs ordered are listed, but only abnormal results are displayed) Labs Reviewed  CBC WITH DIFFERENTIAL/PLATELET  COMPREHENSIVE METABOLIC PANEL  URINALYSIS, ROUTINE W REFLEX MICROSCOPIC    EKG  EKG Interpretation None       Radiology No results found.  Procedures Procedures (including critical care time)  Medications Ordered in ED Medications  methocarbamol (ROBAXIN) injection 500 mg (not administered)  lactated ringers bolus 1,000 mL (1,000 mLs Intravenous New Bag/Given 08/31/16 1147)  ketorolac (TORADOL) 30 MG/ML injection 15 mg (15 mg Intravenous Given 08/31/16 1147)  HYDROmorphone (DILAUDID) injection 1 mg (1 mg Intravenous Given 08/31/16 1147)     Initial Impression / Assessment and Plan / ED Course  I have reviewed the triage vital signs and the nursing notes.  Pertinent labs & imaging results that were available during my care of the patient were reviewed by me and considered in my medical decision making (see chart for details).     Acute exacerbation of chronic back pain without obvious red flags. Will eval UA, basic labs, treat symptoms.   Symptoms improved but has lower BP than previously. Plan for fluids and continued management.   At time of care transfer, still pain free, moving freely but with soft BP's so will continue hydration and ensure able to ambulate.   Final Clinical  Impressions(s) / ED Diagnoses   Final diagnoses:  None    New Prescriptions New Prescriptions   No medications on file     Emanuelle Bastos, Corene Cornea, MD 09/01/16 2138

## 2016-08-31 NOTE — ED Notes (Signed)
Attempted to start new fluid bolus per order, but patient IV was found to be clotted off and unable to be flushed. PIV discontinued and attempts for 2nd PIV failed. Ultrasound IV placement needed- Ultrasound nurse notified.

## 2016-08-31 NOTE — ED Notes (Signed)
Pt stated that she felt very weak while ambulating "like she was about to fall down".

## 2016-09-02 ENCOUNTER — Ambulatory Visit (INDEPENDENT_AMBULATORY_CARE_PROVIDER_SITE_OTHER): Payer: Medicare FFS | Admitting: Family

## 2016-09-02 ENCOUNTER — Ambulatory Visit (INDEPENDENT_AMBULATORY_CARE_PROVIDER_SITE_OTHER): Payer: Medicare FFS

## 2016-09-02 DIAGNOSIS — M545 Low back pain: Secondary | ICD-10-CM

## 2016-09-02 DIAGNOSIS — G8929 Other chronic pain: Secondary | ICD-10-CM

## 2016-09-02 DIAGNOSIS — M5441 Lumbago with sciatica, right side: Secondary | ICD-10-CM | POA: Diagnosis not present

## 2016-09-02 MED ORDER — PREDNISONE 10 MG PO TABS: ORAL_TABLET | ORAL | 0 refills | Status: DC

## 2016-09-08 ENCOUNTER — Telehealth: Payer: Self-pay | Admitting: Internal Medicine

## 2016-09-08 NOTE — Telephone Encounter (Signed)
°  1. Has your device fired? no  2. Is you device beeping? No just there with green light on (the one under the stars)   3. Are you experiencing draining or swelling at device site? no  4. Are you calling to see if we received your device transmission? no  5. Have you passed out?   Patient calling, states that she does not believe that her defib is working properly.

## 2016-09-08 NOTE — Telephone Encounter (Signed)
Spoke with pt, pt stated that she didn't think her home monitor was working, informed pt that her home monitor had updated last night. Walked pt through on how to send a manual transmission, informed pt that I would give her a call back if the transmission was abnormal. Pt voiced understanding.

## 2016-09-12 NOTE — Progress Notes (Signed)
Office Visit Note   Patient: [name]           Date of Birth: October 17, 1940           MRN: [id number] Visit Date: 09/02/2016              Requested by: Nicola Girt, [address] [place], [place] [address] PCP: Nicola Girt, DO  Chief Complaint  Patient presents with  . Lower Back - Pain      HPI: The patient is a 76 year old woman who presents today complaining of him chronic low back pain. She's been in extreme pain for the last 2 weeks. She complains of a 3-4 week history of bilateral hip and leg pain. Complaining of burning and aching down her legs she is unable to sit or stand or walk or get comfortable due to pain. She was seen in the emergency department on June 27 of this year. They have prescribed oxycodone as well as Robaxin. States these are not helpful.   She is a patient of Dr. Ellene Route who has performed her back fusions. States that she had an appointment last week for [facility] however she was unable to wait until her appointment time and left without receiving the injection. Today is requesting epidural steroid injection.  States she has an appointment with Dr. Ellene Route for [facility] rescheduled for July 20 10th she feels she cannot wait until that time.  Assessment & Plan: Visit Diagnoses:  1. Chronic right-sided low back pain with right-sided sciatica   2. Chronic bilateral low back pain, with sciatica presence unspecified     Plan: Provided the patient with a prescription for prednisone and advised her to keep her appointment Dr. Ellene Route.  Follow-Up Instructions: Return in about 4 weeks (around 09/30/2016), or if symptoms worsen or fail to improve.   Back Exam   Tenderness  The patient is experiencing tenderness in the lumbar.  Muscle Strength  The patient has normal back strength.  Tests  Straight leg raise right: positive Straight leg raise left: positive  Other  Gait: normal  Erythema: no back redness      Patient is alert,  oriented, no adenopathy, well-dressed, normal affect, normal respiratory effort.   Imaging: No results found.  Labs: Lab Results  Component Value Date   HGBA1C (H) 08/20/2009    5.9 (NOTE)                                                                       According to the ADA Clinical Practice Recommendations for 2011, when HbA1c is used as a screening test:   >=6.5%   Diagnostic of Diabetes Mellitus           (if abnormal result  is confirmed)  5.7-6.4%   Increased risk of developing Diabetes Mellitus  References:Diagnosis and Classification of Diabetes Mellitus,Diabetes GMWN,0272,53(GUYQI 1):S62-S69 and Standards of Medical Care in         Diabetes - 2011,Diabetes Care,2011,34  (Suppl 1):S11-S61.   REPTSTATUS 08/22/2009 FINAL 08/20/2009   CULT ESCHERICHIA COLI 08/20/2009   LABORGA ESCHERICHIA COLI 08/20/2009    Orders:  Orders Placed This Encounter  Procedures  . XR Lumbar Spine 2-3 Views   Meds  ordered this encounter  Medications  . DISCONTD: predniSONE (DELTASONE) 10 MG tablet    Sig: 6 tablets for 2 days, then 5 for 2 days, then 4 for 2 days, then 3  for 2 days, then 2 for 2 days, then 1 tablet for 2 days    Dispense:  42 tablet    Refill:  0  . predniSONE (DELTASONE) 10 MG tablet    Sig: 6 tablets for 2 days, then 5 for 2 days, then 4 for 2 days, then 3  for 2 days, then 2 for 2 days, then 1 tablet for 2 days    Dispense:  42 tablet    Refill:  0     Procedures: No procedures performed  Clinical Data: No additional findings.  ROS:  All other systems negative, except as noted in the HPI. Review of Systems  Constitutional: Negative for chills and fever.  Musculoskeletal: Positive for back pain.  Neurological: Positive for numbness. Negative for weakness.    Objective: Vital Signs: There were no vitals taken for this visit.  Specialty Comments:  No specialty comments available.  PMFS History: Patient Active Problem List   Diagnosis Date Noted  .  Tachycardia 05/26/2015  . Syncope 05/26/2015  . ICD (implantable cardioverter-defibrillator), biventricular, in situ 05/26/2015  . Chronic kidney disease (CKD), stage III (moderate) 04/10/2015  . Chronic systolic heart failure ([place]) 04/10/2015  . CAD in native artery 04/10/2015  . Esophagitis, reflux 04/10/2015  . Avitaminosis D 04/10/2015  . Cough 03/10/2015  . Episode of syncope 03/10/2015  . Chronic pain   . CKD (chronic kidney disease), stage III   . Chronic systolic CHF (congestive heart failure) ([place])   . Diabetes mellitus ([place])   . Hypertension   . Coronary artery disease   . Acute on chronic systolic CHF (congestive heart failure), NYHA class 3 ([place]) 06/11/2014  . CAD (coronary artery disease) 05/29/2014  . Atherosclerosis of native coronary artery of native heart without angina pectoris   . Left ventricular dysfunction   . Dyspnea on exertion   . Chest pain 05/01/2013  . Hyperlipidemia 12/26/2012  . Diabetes ([place]) 12/26/2012  . Tobacco abuse 12/26/2012  . Left bundle branch block 12/26/2012  . Block, bundle branch, left 12/26/2012  . DYSPNEA 10/20/2008  . Coronary atherosclerosis 10/06/2008  . Essential hypertension 10/03/2008  . COPD 10/03/2008  . SLEEP APNEA 10/03/2008  . Chronic obstructive pulmonary disease ([place]) 10/03/2008   Past Medical History:  Diagnosis Date  . AICD (automatic cardioverter/defibrillator) present    [place] [id number] ICD serial [id number]  . Anemia    as teenager  . Anxiety   . Arthritis   . Biventricular ICD (implantable cardioverter-defibrillator) in place   . Chronic pain    a. Prior h/o chronic pain on methadone.  . Chronic systolic CHF (congestive heart failure) (HCC)    a. mixed ischemic/non-ischemic cardiomyopathy. Varying EF over the years but most recently 20%, s/p CRT-D in 06/2014.  Marland Kitchen CKD (chronic kidney disease), stage III   . Complication of anesthesia    hard time waking her up from general surgery  . Coronary artery disease      a. remote RCA stenting in 2008 with non-DES. b. Cath 06/2014 following abnormal nuc: Stable, unchanged from prior cath, patent stent and 40% LM.  . Diabetes mellitus ([place])   . GERD (gastroesophageal reflux disease)   . Headache   . Hyperlipidemia   . Hypertension   . LBBB (left bundle branch block)   .  Nonischemic cardiomyopathy ([place])   . OSA (obstructive sleep apnea)    AHI-9.77/hr, during REM-50.32/hr  . Syncope    2D ECHO, 08/21/2009 - EF 45-50%, normal (but later EFs abnormal)  . Syncope and collapse    CAROTID DOPPLER, 08/21/2009 - no significant stenosis demonstrated  . Tobacco abuse     Family History  Problem Relation Age of Onset  . Stroke Mother   . Hypertension Mother   . Coronary artery disease Father   . Stroke Brother   . Heart disease Brother   . Other Brother        H1N1 VIRUS  . Healthy Sister   . Healthy Sister     Past Surgical History:  Procedure Laterality Date  . BACK SURGERY  2013  . BI-VENTRICULAR IMPLANTABLE CARDIOVERTER DEFIBRILLATOR N/A 06/11/2014   Procedure: BI-VENTRICULAR IMPLANTABLE CARDIOVERTER DEFIBRILLATOR  (CRT-D);  Surgeon: Deboraha Sprang, MD; [facility] [phone] ICD serial [phone]   . CARDIAC CATHETERIZATION  03/31/2010   Moderate-noncritical CAD  . CARDIAC CATHETERIZATION  05/04/2007   Recommended medical therapy  . CARDIAC CATHETERIZATION  12/14/2006   Recommended stenting of RCA  . CARDIAC CATHETERIZATION  12/14/2006   RCA stented with a 3.0 [employer] Liberte stent resulting in a reduction of 75% to 0% residual  . CARDIAC CATHETERIZATION  05/14/2004   Recommending medical therapy  . CARDIAC CATHETERIZATION  08/26/1999   No intervention  . CHOLECYSTECTOMY     20 years ago  . COLONOSCOPY WITH PROPOFOL N/A 12/15/2014   Procedure: COLONOSCOPY WITH PROPOFOL;  Surgeon: Clarene Essex, MD;  Location: [facility] ENDOSCOPY;  Service: Endoscopy;  Laterality: N/A;  . EYE SURGERY     bilateral cataract surgery   . LEFT AND RIGHT HEART CATHETERIZATION  WITH CORONARY ANGIOGRAM N/A 05/29/2014   Procedure: LEFT AND RIGHT HEART CATHETERIZATION WITH CORONARY ANGIOGRAM;  Surgeon: Lorretta Harp, MD;  Location: [facility] CATH LAB;  Service: Cardiovascular;  Laterality: N/A;  . LEFT HEART CATHETERIZATION WITH CORONARY ANGIOGRAM N/A 12/27/2012   Procedure: LEFT HEART CATHETERIZATION WITH CORONARY ANGIOGRAM;  Surgeon: Lorretta Harp, MD;  Location: [facility] CATH LAB;  Service: Cardiovascular;  Laterality: N/A;   Social History   Occupational History  . Not on file.   Social History Main Topics  . Smoking status: Former Smoker    Packs/day: 1.00    Years: 58.00    Types: E-cigarettes    Start date: 12/26/1952    Quit date: 12/05/2013  . Smokeless tobacco: Never Used     Comment: uses Vape cigarettes  . Alcohol use No  . Drug use: No  . Sexual activity: Not on file

## 2016-09-26 ENCOUNTER — Ambulatory Visit (INDEPENDENT_AMBULATORY_CARE_PROVIDER_SITE_OTHER): Payer: Medicare FFS | Admitting: *Deleted

## 2016-09-26 ENCOUNTER — Telehealth: Payer: Self-pay

## 2016-09-26 DIAGNOSIS — Z95810 Presence of automatic (implantable) cardiac defibrillator: Secondary | ICD-10-CM

## 2016-09-26 DIAGNOSIS — I428 Other cardiomyopathies: Secondary | ICD-10-CM

## 2016-09-26 DIAGNOSIS — I5022 Chronic systolic (congestive) heart failure: Secondary | ICD-10-CM | POA: Diagnosis not present

## 2016-09-26 NOTE — Progress Notes (Signed)
EPIC Encounter for ICM Monitoring  Patient Name: [name] is a 76 y.o. female Date: 09/26/2016 Primary Care Physican: Nicola Girt, DO Primary [place] Electrophysiologist: Caryl Comes Dry Weight:unknown Bi-V Pacing: 99%      Attempted call to patient and unable to reach. Transmission reviewed.  Patient has had 3 ER visits in last month for hyperglycemia of >500.  Per ER note on 09/19/2016 patient came to ER due to recent fall and hitting her head as well as hyperglycemia and it was noted she was eating bag of cheetos as she was being assessed in ER.    Thoracic impedance abnormal suggesting dryness since 09/04/2016.   Prescribed dosage: Furosemide 40 mg 2 tablets (80 mg total) every AMand 1 tablet (40 mg total) every PM. Potassium 20 mEq 1 tablet daily.  Labs: 09/19/2016 Creatinine 1.39, BUN 36, Potassium 4.1, Sodium 125, EGFR 37-45 Care Everywhere results 09/13/2016 Creatinine 1.51, BUN 30, Potassium 4.6, Sodium 128, EGFR 34-41 Care Everywhere results 08/31/2016 Creatinine 1.40, BUN 16, Potassium 4.3, Sodium 137, EGFR 36-42 06/09/2016 Creatinine 1.51, BUN 23, Potassium 4.5, Sodium 135 Care Everywhere results 06/01/2016 Creatinine 1.07, BUN 5, Potassium 3.7, Sodium 143, EGFR 50->60 Care Everywhere results 05/31/2016 Creatinine 1.13, BUN 8, Potassium 3.4, Sodium 145, EGFR 47-57 Care Everywhere results 05/30/2016 Creatinine 1.25, BUN 12, Potassium 3.6, Sodium 137, EGFR 42-51 Care Everywhere results 05/05/2016 Creatinine 1.45, BUN 21, Potassium 4.5, Sodium 135 Care Everywhere results 05/26/2015 Creatinine 1.36, BUN 17, Potassium 4.5, Sodium 140  Recommendations: NONE - Unable to reach patient   Follow-up plan: ICM clinic phone appointment on 10/27/2016.  Patient due to make August appointment with Dr Gwenlyn Found  Copy of ICM check sent to Dr Gwenlyn Found and Dr Caryl Comes.   3 month ICM trend: 09/26/2016   1 Year ICM  trend:      [name], RN 09/26/2016 9:48 AM

## 2016-09-26 NOTE — Telephone Encounter (Signed)
Remote ICM transmission received.  Attempted patient call and unable to leave message due full mail box.

## 2016-09-29 ENCOUNTER — Encounter: Payer: Self-pay | Admitting: Cardiology

## 2016-10-02 ENCOUNTER — Other Ambulatory Visit: Payer: Self-pay | Admitting: Cardiovascular Disease

## 2016-10-07 ENCOUNTER — Other Ambulatory Visit: Payer: Self-pay | Admitting: Cardiovascular Disease

## 2016-10-14 ENCOUNTER — Telehealth: Payer: Self-pay | Admitting: Cardiovascular Disease

## 2016-10-14 ENCOUNTER — Other Ambulatory Visit: Payer: Self-pay

## 2016-10-14 MED ORDER — LISINOPRIL 2.5 MG PO TABS: 2.5000 mg | ORAL_TABLET | Freq: Every day | ORAL | 0 refills | Status: DC

## 2016-10-14 NOTE — Telephone Encounter (Signed)
rx sent to pharmacy

## 2016-10-14 NOTE — Telephone Encounter (Signed)
New message     *STAT* If patient is at the pharmacy, call can be transferred to refill team.   1. Which medications need to be refilled? (please list name of each medication and dose if known) lisinopril (PRINIVIL,ZESTRIL) 2.5 MG tablet  2. Which pharmacy/location (including street and city if local pharmacy) is medication to be sent to? [facility] in [place]  3. Do they need a 30 day or 90 day supply? Pt just wants a supply called in until the 25th when her insurance will pay for the next amount.

## 2016-10-24 ENCOUNTER — Other Ambulatory Visit: Payer: Self-pay | Admitting: Cardiovascular Disease

## 2016-10-25 ENCOUNTER — Other Ambulatory Visit: Payer: Self-pay | Admitting: Cardiovascular Disease

## 2016-10-25 LAB — CUP PACEART REMOTE DEVICE CHECK
Battery Remaining Longevity: 61 mo
Battery Remaining Percentage: 65 %
Battery Voltage: 2.95 V
Brady Statistic AP VP Percent: 10 %
Brady Statistic AP VS Percent: 1 %
Brady Statistic AS VP Percent: 89 %
Brady Statistic AS VS Percent: 1 %
Brady Statistic RA Percent Paced: 10 %
Date Time Interrogation Session: 20180723102902
HighPow Impedance: 95 Ohm
HighPow Impedance: 95 Ohm
Implantable Lead Implant Date: 20160406
Implantable Lead Implant Date: 20160406
Implantable Lead Implant Date: 20160406
Implantable Lead Location: 753858
Implantable Lead Location: 753859
Implantable Lead Location: 753860
Implantable Lead Model: 7122
Implantable Pulse Generator Implant Date: 20160406
Lead Channel Impedance Value: 1275 Ohm
Lead Channel Impedance Value: 400 Ohm
Lead Channel Impedance Value: 740 Ohm
Lead Channel Pacing Threshold Amplitude: 0.75 V
Lead Channel Pacing Threshold Amplitude: 0.75 V
Lead Channel Pacing Threshold Amplitude: 1.125 V
Lead Channel Pacing Threshold Pulse Width: 0.5 ms
Lead Channel Pacing Threshold Pulse Width: 0.5 ms
Lead Channel Pacing Threshold Pulse Width: 0.5 ms
Lead Channel Sensing Intrinsic Amplitude: 12 mV
Lead Channel Sensing Intrinsic Amplitude: 5 mV
Lead Channel Setting Pacing Amplitude: 1.75 V
Lead Channel Setting Pacing Amplitude: 2 V
Lead Channel Setting Pacing Amplitude: 2.125
Lead Channel Setting Pacing Pulse Width: 0.5 ms
Lead Channel Setting Pacing Pulse Width: 0.5 ms
Lead Channel Setting Sensing Sensitivity: 0.5 mV
Pulse Gen Serial Number: 7199559

## 2016-10-25 NOTE — Telephone Encounter (Signed)
REFILL 

## 2016-10-27 ENCOUNTER — Ambulatory Visit (INDEPENDENT_AMBULATORY_CARE_PROVIDER_SITE_OTHER): Payer: Medicare FFS

## 2016-10-27 DIAGNOSIS — I2510 Atherosclerotic heart disease of native coronary artery without angina pectoris: Secondary | ICD-10-CM

## 2016-10-27 DIAGNOSIS — I5022 Chronic systolic (congestive) heart failure: Secondary | ICD-10-CM | POA: Diagnosis not present

## 2016-10-27 DIAGNOSIS — Z95810 Presence of automatic (implantable) cardiac defibrillator: Secondary | ICD-10-CM

## 2016-10-27 NOTE — Progress Notes (Signed)
EPIC Encounter for ICM Monitoring  Patient Name: [name] is a 76 y.o. female Date: 10/27/2016 Primary Care Physican: Nicola Girt, DO Primary [place] Electrophysiologist: Caryl Comes Dry Weight:unknown Bi-V Pacing: >99%           Heart Failure questions reviewed, pt asymptomatic.   Thoracic impedance abnormal suggesting fluid accumulation since 10/18/2016 and almost back at baseline today.  Prescribed dosage: Furosemide 40 mg 2 tablets (80 mg total) every AMand 1 tablet (40 mg total) every PM. Potassium 20 mEq 1 tablet daily.  Labs: 09/19/2016 Creatinine 1.39, BUN 36, Potassium 4.1, Sodium 125, EGFR 37-45   Care Everywhere results 09/13/2016 Creatinine 1.51, BUN 30, Potassium 4.6, Sodium 128, EGFR 34-41   Care Everywhere results 08/31/2016 Creatinine 1.40, BUN 16, Potassium 4.3, Sodium 137, EGFR 36-42 06/09/2016 Creatinine 1.51, BUN 23, Potassium 4.5, Sodium 135 Care Everywhere results 06/01/2016 Creatinine 1.07, BUN 5, Potassium 3.7, Sodium 143, EGFR 50->60 Care Everywhere results 05/31/2016 Creatinine 1.13, BUN 8, Potassium 3.4, Sodium 145, EGFR 47-57 Care Everywhere results 05/30/2016 Creatinine 1.25, BUN 12, Potassium 3.6, Sodium 137, EGFR 42-51 Care Everywhere results 05/05/2016 Creatinine 1.45, BUN 21, Potassium 4.5, Sodium 135 Care Everywhere results 05/26/2015 Creatinine 1.36, BUN 17, Potassium 4.5, Sodium 140  Recommendations: No changes.  Lengthy discussion spent on controlling salt and fluids.  Patient has been using light salt which has 290 mg salt per 1/4 teaspoon and not reviewing food labels for salt content. Advised to replace salt with herbs and flavored vinegars which do not have any salt.  She said it is difficult since she has diabetes as well.   Advised her to discuss with PCP about attending diabetes class since it has been 20 years since she attended a class.     Follow-up plan: ICM  clinic phone appointment on 11/28/2016.  Office appointment scheduled 12/06/2016 with Dr. Gwenlyn Found.  Copy of ICM check sent to Dr. Gwenlyn Found and Dr. Caryl Comes.   3 month ICM trend: 10/27/2016   1 Year ICM trend:      [name], RN 10/27/2016 10:16 AM

## 2016-11-22 ENCOUNTER — Telehealth: Payer: Self-pay | Admitting: Cardiovascular Disease

## 2016-11-22 NOTE — Telephone Encounter (Signed)
Returned call to patient of Dr. Avelina Laine. [name] (EP - ICD) Her son passed away yesterday and she is audibly upset - provided condolences  She has bilateral LE edema since yesterday that worsened throughout the day. Edema greater in left leg. Lasix Rx is 40mg  - take 2 tabs QAM and 1 tab QPM. She is watching her fluid and salt intake. She does not have worsening SOB. Advised to take extra lasix this evening and extra lasix tomorrow AM  Labs from care everywhere on 8/1 [facility]) BUN   24 Creat 1.36 K+      5  Advised will defer to MD for further advice

## 2016-11-22 NOTE — Telephone Encounter (Signed)
New message    Pt c/o swelling: STAT is pt has developed SOB within 24 hours  1. How long have you been experiencing swelling? Since yesterday  2. Where is the swelling located? Legs and feet  3.  Are you currently taking a "fluid pill"? Yes, pt wants to know if she can take an extra.  4.  Are you currently SOB? No   5.  Have you traveled recently? No

## 2016-11-26 ENCOUNTER — Other Ambulatory Visit: Payer: Self-pay | Admitting: Cardiovascular Disease

## 2016-11-28 ENCOUNTER — Ambulatory Visit (INDEPENDENT_AMBULATORY_CARE_PROVIDER_SITE_OTHER): Payer: Medicare FFS

## 2016-11-28 DIAGNOSIS — I5022 Chronic systolic (congestive) heart failure: Secondary | ICD-10-CM

## 2016-11-28 DIAGNOSIS — Z95810 Presence of automatic (implantable) cardiac defibrillator: Secondary | ICD-10-CM

## 2016-11-28 NOTE — Progress Notes (Signed)
EPIC Encounter for ICM Monitoring  Patient Name: [name] is a 76 y.o. female Date: 11/28/2016 Primary Care Physican: Nicola Girt, DO Primary [place] Electrophysiologist: Caryl Comes Dry Weight:unknown Bi-V Pacing: >99%       Heart Failure questions reviewed, pt asymptomatic.  Patient son passed away 1 week ago today.   Patient called Dr Kennon Holter office on 9/18 due to lower extremity edema and was advised to take extra Furosemide for a day    Thoracic impedance normal but was abnormal suggesting fluid accumulation from 8/14 to 8/24, 8/27 to 9/3 and 9/13 to 9/16.  Prescribed dosage: Furosemide 40 mg 2 tablets (80 mg total) every AMand 1 tablet (40 mg total) every PM. Potassium 20 mEq 1 tablet daily.  Labs: 09/19/2016 Creatinine 1.39, BUN 36, Potassium 4.1, Sodium 125, EGFR 37-45   Care Everywhere results 09/13/2016 Creatinine 1.51, BUN 30, Potassium 4.6, Sodium 128, EGFR 34-41   Care Everywhere results 08/31/2016 Creatinine 1.40, BUN 16, Potassium 4.3, Sodium 137, EGFR 36-42 06/09/2016 Creatinine 1.51, BUN 23, Potassium 4.5, Sodium 135 Care Everywhere results 06/01/2016 Creatinine 1.07, BUN 5, Potassium 3.7, Sodium 143, EGFR 50->60 Care Everywhere results 05/31/2016 Creatinine 1.13, BUN 8, Potassium 3.4, Sodium 145, EGFR 47-57 Care Everywhere results 05/30/2016 Creatinine 1.25, BUN 12, Potassium 3.6, Sodium 137, EGFR 42-51 Care Everywhere results 05/05/2016 Creatinine 1.45, BUN 21, Potassium 4.5, Sodium 135 Care Everywhere results 05/26/2015 Creatinine 1.36, BUN 17, Potassium 4.5, Sodium 140  Recommendations: No changes.  Encouraged to call for fluid symptoms.  Follow-up plan: ICM clinic phone appointment on 12/29/2016.  Office appointment scheduled 12/06/2016 with Dr. Gwenlyn Found.  Copy of ICM check sent to Dr. Caryl Comes.   3 month ICM trend: 11/28/2016   1 Year ICM trend:      [name],  RN 11/28/2016 9:03 AM

## 2016-12-06 ENCOUNTER — Ambulatory Visit (INDEPENDENT_AMBULATORY_CARE_PROVIDER_SITE_OTHER): Payer: Medicare FFS | Admitting: Cardiovascular Disease

## 2016-12-06 ENCOUNTER — Encounter: Payer: Self-pay | Admitting: Cardiovascular Disease

## 2016-12-06 DIAGNOSIS — I2510 Atherosclerotic heart disease of native coronary artery without angina pectoris: Secondary | ICD-10-CM

## 2016-12-06 DIAGNOSIS — I519 Heart disease, unspecified: Secondary | ICD-10-CM

## 2016-12-06 DIAGNOSIS — E7800 Pure hypercholesterolemia, unspecified: Secondary | ICD-10-CM

## 2016-12-06 DIAGNOSIS — I10 Essential (primary) hypertension: Secondary | ICD-10-CM

## 2016-12-06 NOTE — Progress Notes (Signed)
12/06/2016 [name]   01/07/1941  [id number]  Primary Physician Nicola Girt, DO Primary Cardiologist: Lorretta Harp MD [name], [place]  HPI:  [name] is a 76 y.o. female mildly overweight married Caucasian female, mother of 70, grandmother of 2 grandchildren, whom I last saw in the office 10/28/15. Unfortunately, there are 49 year old son committed suicide by shooting himself 2 weeks ago and they're very distraught at the current time. She has a history of CAD, status post RCA stenting in the past. She was catheterized by Dr. Janene Madeira May 04, 2007, after abnormal Myoview revealing 30% to 40% eccentric distal left main. A proximal RCA stent was patent and her EF was normal. Other problems include hypertension, hyperlipidemia, and diabetes. She has chronic left bundle branch block. She smokes 1-1/2 packs of cigarettes a day. She has had back surgery by Dr. Lissa Merlin. Myoview stress test performed March 24, 2010, revealed new anteroapical scar, and because of shortness of breath I catheterized her March 31, 2010, revealing 50% "in-stent restenosis" within the RCA stent, which was smooth, 40% distal left main with anatomy unchanged from the prior study and normal LV function. She really denies chest pain or shortness of breath. Her most recent lab work, performed by Dr. Buddy Duty in June, revealed a total cholesterol of 138, LDL of 57, HDL of 44.  I saw her one year ago. Over the last several weeks she developed new onset chest pressure occurring every other day lasting for minutes at a time associated with diaphoresis. I'm concerned that she may have progression of her left main disease or and/or her RCA in-stent restenosis. Her Myoview performed prior to her last cath in January 2012 was remarkable for anteroapical scar versus breast attenuation artifact. Based on her anatomy and her symptoms I elected to proceed with outpatient diagnostic coronary arteriography which I  performed on 12/26/12 revealing essentially unchanged anatomy. Her RCA stent was patent her left main was mild. She did have mild to moderate left ventricular dysfunction with an EF in the 45% range. 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 She gets occasional chest pain which has not changed in frequency or severity. She does complain of back pain and leg pain and is seeing Dr. Ellene Route for this. She has decreased pedal pulses on exam raising the issue of the possibility of peripheral vascular disease. Lower extremity arterial Doppler studies performed in our office 03/14/14 revealed ABIs of 1 bilaterally high-frequency signal in the left external iliac artery although her symptoms are symmetric. She saw Molli Hazard in the office 05/14/14 complaining of dyspnea on exertion and diaphoresis. A 2-D echo performed 05/20/14 as well as a Myoview stress test showed significant worsening of LV function with an EF 20%, a dilated left ventricle and moderate MR with a Myoview that showed scar in the anterior wall apex and septum markedly different than prior functional studies. Based on this result I performed cardiac catheterization on her 05/29/14 revealing unchanged coronary anatomy which was deemed as moderate and stable with severe LV dysfunction and elevated filling pressures. She subsequently underwent biventricular ICD implantation by Dr. Caryl Comes on 06/11/14 with an excellent clinical result which was almost immediate. She now denies chest pain or shortness of breath. Her recent 2-D echo performed 10/22/15 revealed an EF of 50-55%. Since I saw her. She's remained clinically stable. She has seen Dr. Caryl Comes for  ICD follow-up.   Current Meds  Medication Sig  . ACCU-CHEK AVIVA PLUS test strip 1 each by Other route as needed (glucose).   Marland Kitchen ALPRAZolam (XANAX) 1 MG tablet Take 1 mg by mouth 3 (three) times daily as needed for anxiety  or sleep.   Marland Kitchen aspirin EC 81 MG tablet Take 81 mg by mouth at bedtime.  . carvedilol (COREG) 12.5 MG tablet TAKE 1 AND 1/2 TABLETS TWICE DAILY WITH A MEAL  (DOSE  INCREASE)  . cetirizine (ZYRTEC) 10 MG tablet Take 10 mg by mouth daily.  . citalopram (CELEXA) 40 MG tablet Take 1 tablet by mouth daily.  . clopidogrel (PLAVIX) 75 MG tablet TAKE 1 TABLET EVERY DAY  . furosemide (LASIX) 40 MG tablet TAKE 2 TABLETS EVERY MORNING  AND TAKE 1 TABLET EVERY EVENING  . Insulin Detemir (LEVEMIR) 100 UNIT/ML Pen Inject 26 Units into the skin daily at 10 pm.  . levothyroxine (SYNTHROID, LEVOTHROID) 112 MCG tablet Take 112 mcg by mouth at bedtime.   Marland Kitchen MELATONIN PO Take 2 tablets by mouth at bedtime.  . nitroGLYCERIN (NITROSTAT) 0.4 MG SL tablet Place 1 tablet (0.4 mg total) under the tongue every 5 (five) minutes as needed for chest pain.  Marland Kitchen oxyCODONE-acetaminophen (PERCOCET/ROXICET) 5-325 MG tablet Take 1 tablet by mouth every 6 (six) hours as needed for severe pain.   . pantoprazole (PROTONIX) 40 MG tablet Take 40 mg by mouth 2 (two) times daily.   . potassium chloride SA (K-DUR,KLOR-CON) 20 MEQ tablet TAKE 1 TABLET EVERY DAY  (DOSE  INCREASE)  . simvastatin (ZOCOR) 20 MG tablet Take 20 mg by mouth daily with supper.   . [DISCONTINUED] lisinopril (PRINIVIL,ZESTRIL) 2.5 MG tablet TAKE 1 TABLET BY MOUTH EVERY DAY  . [DISCONTINUED] traZODone (DESYREL) 100 MG tablet TAKE 1 TABLET (100 MG TOTAL) BY MOUTH NIGHTLY FOR 30 DAYS.     Allergies  Allergen Reactions  . Valium [Diazepam] Swelling    face    Social History   Social History  . Marital status: Married    Spouse name: N/A  . Number of children: N/A  . Years of education: N/A   Occupational History  . Not on file.   Social History Main Topics  . Smoking status: Former Smoker    Packs/day: 1.00    Years: 58.00    Types: E-cigarettes    Start date: 12/26/1952    Quit date: 12/05/2013  . Smokeless tobacco: Never Used     Comment: uses Vape  cigarettes  . Alcohol use No  . Drug use: No  . Sexual activity: Not on file   Other Topics Concern  . Not on file   Social History Narrative  . No narrative on file     Review of Systems: General: negative for chills, fever, night sweats or weight changes.  Cardiovascular: negative for chest pain, dyspnea on exertion, edema, orthopnea, palpitations, paroxysmal nocturnal dyspnea or shortness of breath Dermatological: negative for rash Respiratory: negative for cough or wheezing Urologic: negative for hematuria Abdominal: negative for nausea, vomiting, diarrhea, bright red blood per rectum, melena, or hematemesis Neurologic: negative for visual changes, syncope, or dizziness All other systems reviewed and are otherwise negative except as noted above.    Blood pressure (!) 98/58, pulse 74, height 5\' 5"  (1.651 m), weight 158 lb (71.7 kg).  General appearance: alert and no distress Neck: no adenopathy, no carotid bruit, no JVD, supple, symmetrical, trachea midline and thyroid not enlarged, symmetric, no tenderness/mass/nodules Lungs:  clear to auscultation bilaterally Heart: regular rate and rhythm, S1, S2 normal, no murmur, click, rub or gallop Extremities: extremities normal, atraumatic, no cyanosis or edema Pulses: 2+ and symmetric Skin: Skin color, texture, turgor normal. No rashes or lesions Neurologic: Alert and oriented X 3, normal strength and tone. Normal symmetric reflexes. Normal coordination and gait  EKG paced rhythm at 72. I personally reviewed this EKG.  ASSESSMENT AND PLAN:   Essential hypertension History of essential hypertension blood pressure measured at 98/58. She is on low-dose lisinopril 2.5 mg and carvedilol. Continue current meds at current dosing.  Coronary atherosclerosis History of coronary artery disease status post stenting of her RCA in the past. I last catheterized her 05/29/14 revealing unchanged anatomy with a RCA stent.  Hyperlipidemia History  of hyperlipidemia on statin therapy followed by her PCP  Left ventricular dysfunction History of nonischemic cardiomyopathy status post biventricular ICD implantation by Dr. Hermelinda Medicus on 06/11/14. She was a CRT responder with an immediate improvement in her lethargy to EF up to 50-55% from 20%. She denies shortness of breath.      Lorretta Harp MD FACP,FACC,FAHA, [facility] 12/06/2016 11:41 AM

## 2016-12-06 NOTE — Assessment & Plan Note (Signed)
History of nonischemic cardiomyopathy status post biventricular ICD implantation by Dr. Hermelinda Medicus on 06/11/14. She was a CRT responder with an immediate improvement in her lethargy to EF up to 50-55% from 20%. She denies shortness of breath.

## 2016-12-06 NOTE — Patient Instructions (Signed)
Medication Instructions: STOP Lisinopril.   Follow-Up: Your physician recommends that you schedule a follow-up appointment in: 1 month with PharmD for Blood Pressure. Your physician has requested that you regularly monitor and record your blood pressure readings at home. Please use the same machine at the same time of day to check your readings and record them to bring to your follow-up visit.  Your physician wants you to follow-up in: 1 year with Dr. Gwenlyn Found. You will receive a reminder letter in the mail two months in advance. If you don't receive a letter, please call our office to schedule the follow-up appointment.  If you need a refill on your cardiac medications before your next appointment, please call your pharmacy.

## 2016-12-06 NOTE — Assessment & Plan Note (Signed)
History of coronary artery disease status post stenting of her RCA in the past. I last catheterized her 05/29/14 revealing unchanged anatomy with a RCA stent.

## 2016-12-06 NOTE — Assessment & Plan Note (Signed)
History of essential hypertension blood pressure measured at 98/58. She is on low-dose lisinopril 2.5 mg and carvedilol. Continue current meds at current dosing.

## 2016-12-06 NOTE — Assessment & Plan Note (Signed)
History of hyperlipidemia on statin therapy followed by her PCP. 

## 2016-12-29 ENCOUNTER — Ambulatory Visit (INDEPENDENT_AMBULATORY_CARE_PROVIDER_SITE_OTHER): Payer: Medicare FFS | Admitting: *Deleted

## 2016-12-29 DIAGNOSIS — Z95810 Presence of automatic (implantable) cardiac defibrillator: Secondary | ICD-10-CM

## 2016-12-29 DIAGNOSIS — I428 Other cardiomyopathies: Secondary | ICD-10-CM

## 2016-12-29 DIAGNOSIS — I5022 Chronic systolic (congestive) heart failure: Secondary | ICD-10-CM | POA: Diagnosis not present

## 2016-12-29 NOTE — Progress Notes (Signed)
Remote ICD transmission.   

## 2016-12-30 ENCOUNTER — Telehealth: Payer: Self-pay

## 2016-12-30 ENCOUNTER — Encounter: Payer: Self-pay | Admitting: Cardiology

## 2016-12-30 LAB — CUP PACEART REMOTE DEVICE CHECK
Battery Remaining Longevity: 56 mo
Battery Remaining Percentage: 62 %
Battery Voltage: 2.95 V
Brady Statistic AP VP Percent: 9.6 %
Brady Statistic AP VS Percent: 1 %
Brady Statistic AS VP Percent: 90 %
Brady Statistic AS VS Percent: 1 %
Brady Statistic RA Percent Paced: 9.5 %
Date Time Interrogation Session: 20181025080019
HighPow Impedance: 82 Ohm
HighPow Impedance: 82 Ohm
Implantable Lead Implant Date: 20160406
Implantable Lead Implant Date: 20160406
Implantable Lead Implant Date: 20160406
Implantable Lead Location: 753858
Implantable Lead Location: 753859
Implantable Lead Location: 753860
Implantable Lead Model: 7122
Implantable Pulse Generator Implant Date: 20160406
Lead Channel Impedance Value: 1200 Ohm
Lead Channel Impedance Value: 380 Ohm
Lead Channel Impedance Value: 650 Ohm
Lead Channel Pacing Threshold Amplitude: 0.75 V
Lead Channel Pacing Threshold Amplitude: 0.875 V
Lead Channel Pacing Threshold Amplitude: 1.25 V
Lead Channel Pacing Threshold Pulse Width: 0.5 ms
Lead Channel Pacing Threshold Pulse Width: 0.5 ms
Lead Channel Pacing Threshold Pulse Width: 0.5 ms
Lead Channel Sensing Intrinsic Amplitude: 12 mV
Lead Channel Sensing Intrinsic Amplitude: 5 mV
Lead Channel Setting Pacing Amplitude: 1.75 V
Lead Channel Setting Pacing Amplitude: 2 V
Lead Channel Setting Pacing Amplitude: 2.25 V
Lead Channel Setting Pacing Pulse Width: 0.5 ms
Lead Channel Setting Pacing Pulse Width: 0.5 ms
Lead Channel Setting Sensing Sensitivity: 0.5 mV
Pulse Gen Serial Number: 7199559

## 2016-12-30 NOTE — Progress Notes (Signed)
EPIC Encounter for ICM Monitoring  Patient Name: [name] is a 76 y.o. female Date: 12/30/2016 Primary Care Physican: Nicola Girt, DO Primary [place] Electrophysiologist: Caryl Comes Dry Weigunknown Bi-V Pacing: >99%      Attempted call to patient and unable to reach.  Left detailed message regarding transmission.  Transmission reviewed.    Thoracic impedance normal.  Prescribed dosage: Furosemide 40 mg 2 tablets (80 mg total) every AMand 1 tablet (40 mg total) every PM. Potassium 20 mEq 1 tablet daily.  Labs: 09/19/2016 Creatinine 1.39, BUN 36, Potassium 4.1, Sodium 125, EGFR 37-45 Care Everywhere results 09/13/2016 Creatinine 1.51, BUN 30, Potassium 4.6, Sodium 128, EGFR 34-41 Care Everywhere results 08/31/2016 Creatinine 1.40, BUN 16, Potassium 4.3, Sodium 137, EGFR 36-42 06/09/2016 Creatinine 1.51, BUN 23, Potassium 4.5, Sodium 135 Care Everywhere results 06/01/2016 Creatinine 1.07, BUN 5, Potassium 3.7, Sodium 143, EGFR 50->60 Care Everywhere results 05/31/2016 Creatinine 1.13, BUN 8, Potassium 3.4, Sodium 145, EGFR 47-57 Care Everywhere results 05/30/2016 Creatinine 1.25, BUN 12, Potassium 3.6, Sodium 137, EGFR 42-51 Care Everywhere results 05/05/2016 Creatinine 1.45, BUN 21, Potassium 4.5, Sodium 135 Care Everywhere results 05/26/2015 Creatinine 1.36, BUN 17, Potassium 4.5, Sodium 140  Recommendations: Left voice mail with ICM number and encouraged to call if experiencing any fluid symptoms.  Follow-up plan: ICM clinic phone appointment on 01/31/2017.    Copy of ICM check sent to Dr. Caryl Comes.   3 month ICM trend: 12/29/2016   1 Year ICM trend:      [name], RN 12/30/2016 9:57 AM

## 2016-12-30 NOTE — Telephone Encounter (Signed)
Remote ICM transmission received.  Attempted call to patient and left detailed message per DPR regarding transmission and next ICM scheduled for 01/31/2017.  Advised to return call for any fluid symptoms or questions.

## 2017-01-11 NOTE — Progress Notes (Deleted)
01/11/2017 Nesa M Gattuso 28-Jul-1940 [id number]   HPI:  [name] is a 76 y.o. female patient of Dr Gwenlyn Found, with a PMH below who presents today for hypertension clinic evaluation.  Her medical history is significant for CAD, CHF, left BBB, hyperlipidemia, diabetes, CKD (stage 3, GFR 36) and tobacco abuse.  She had a biventricular ICD placed in 2016 and has felt much better since then.      Blood Pressure Goal:  130/80  Current Medications:  Lisinopril 2.5 mg qd  Carvedilol 12.5 mg bid  Family Hx:  Social Hx:  Diet:  Exercise:  Home BP readings:  Intolerances:   CrCl cannot be calculated (Patient's most recent lab result is older than the maximum 21 days allowed.).  Wt Readings from Last 3 Encounters:  12/06/16 158 lb (71.7 kg)  08/31/16 169 lb (76.7 kg)  05/25/16 192 lb (87.1 kg)   BP Readings from Last 3 Encounters:  12/06/16 (!) 98/58  08/31/16 (!) 109/91  05/25/16 112/61   Pulse Readings from Last 3 Encounters:  12/06/16 74  08/31/16 64  05/25/16 82    Current Outpatient Medications  Medication Sig Dispense Refill  . ACCU-CHEK AVIVA PLUS test strip 1 each by Other route as needed (glucose).     Marland Kitchen ALPRAZolam (XANAX) 1 MG tablet Take 1 mg by mouth 3 (three) times daily as needed for anxiety or sleep.     Marland Kitchen aspirin EC 81 MG tablet Take 81 mg by mouth at bedtime.    . carvedilol (COREG) 12.5 MG tablet TAKE 1 AND 1/2 TABLETS TWICE DAILY WITH A MEAL  (DOSE  INCREASE) 270 tablet 1  . cetirizine (ZYRTEC) 10 MG tablet Take 10 mg by mouth daily.    . citalopram (CELEXA) 40 MG tablet Take 1 tablet by mouth daily.    . clopidogrel (PLAVIX) 75 MG tablet TAKE 1 TABLET EVERY DAY 90 tablet 1  . furosemide (LASIX) 40 MG tablet TAKE 2 TABLETS EVERY MORNING  AND TAKE 1 TABLET EVERY EVENING 270 tablet 3  . Insulin Detemir (LEVEMIR) 100 UNIT/ML Pen Inject 26 Units into the skin daily at 10 pm.    . levothyroxine (SYNTHROID, LEVOTHROID) 112 MCG tablet Take 112 mcg by mouth  at bedtime.     Marland Kitchen MELATONIN PO Take 2 tablets by mouth at bedtime.    . nitroGLYCERIN (NITROSTAT) 0.4 MG SL tablet Place 1 tablet (0.4 mg total) under the tongue every 5 (five) minutes as needed for chest pain. 25 tablet 3  . oxyCODONE-acetaminophen (PERCOCET/ROXICET) 5-325 MG tablet Take 1 tablet by mouth every 6 (six) hours as needed for severe pain.     . pantoprazole (PROTONIX) 40 MG tablet Take 40 mg by mouth 2 (two) times daily.     . potassium chloride SA (K-DUR,KLOR-CON) 20 MEQ tablet TAKE 1 TABLET EVERY DAY  (DOSE  INCREASE) 90 tablet 3  . simvastatin (ZOCOR) 20 MG tablet Take 20 mg by mouth daily with supper.      No current facility-administered medications for this visit.     Allergies  Allergen Reactions  . Valium [Diazepam] Swelling    face    Past Medical History:  Diagnosis Date  . AICD (automatic cardioverter/defibrillator) present    [place] [id number] ICD serial [phone]  . Anemia    as teenager  . Anxiety   . Arthritis   . Biventricular ICD (implantable cardioverter-defibrillator) in place   . Chronic pain    a. Prior  h/o chronic pain on methadone.  . Chronic systolic CHF (congestive heart failure) (HCC)    a. mixed ischemic/non-ischemic cardiomyopathy. Varying EF over the years but most recently 20%, s/p CRT-D in 06/2014.  Marland Kitchen CKD (chronic kidney disease), stage III ([place])   . Complication of anesthesia    hard time waking her up from general surgery  . Coronary artery disease    a. remote RCA stenting in 2008 with non-DES. b. Cath 06/2014 following abnormal nuc: Stable, unchanged from prior cath, patent stent and 40% LM.  . Diabetes mellitus ([place])   . GERD (gastroesophageal reflux disease)   . Headache   . Hyperlipidemia   . Hypertension   . LBBB (left bundle branch block)   . Nonischemic cardiomyopathy ([place])   . OSA (obstructive sleep apnea)    AHI-9.77/hr, during REM-50.32/hr  . Syncope    2D ECHO, 08/21/2009 - EF 45-50%, normal (but later EFs abnormal)  .  Syncope and collapse    CAROTID DOPPLER, 08/21/2009 - no significant stenosis demonstrated  . Tobacco abuse     There were no vitals taken for this visit.  No problem-specific Assessment & Plan notes found for this encounter.   Tommy Medal PharmD CPP [facility] [address] [place], [place] [address] [phone]

## 2017-01-12 ENCOUNTER — Ambulatory Visit: Payer: Medicare FFS

## 2017-01-12 ENCOUNTER — Other Ambulatory Visit: Payer: Self-pay | Admitting: Cardiovascular Disease

## 2017-01-12 DIAGNOSIS — I2510 Atherosclerotic heart disease of native coronary artery without angina pectoris: Secondary | ICD-10-CM

## 2017-01-12 MED ORDER — FUROSEMIDE 40 MG PO TABS: ORAL_TABLET | ORAL | 0 refills | Status: DC

## 2017-01-12 NOTE — Telephone Encounter (Signed)
Sent to [facility]

## 2017-01-12 NOTE — Telephone Encounter (Signed)
°*  STAT* If patient is at the pharmacy, call can be transferred to refill team.   1. Which medications need to be refilled? (please list name of each medication and dose if known) Furosemide-Pt need enough called in until her mail order comes in please  2. Which pharmacy/location (including street and city if local pharmacy) is medication to be sent to?[facility] [phone] 3. Do they need a 30 day or 90 day supply? Just want enough for 4 days until her mail order supply comes in

## 2017-01-13 ENCOUNTER — Telehealth: Payer: Self-pay | Admitting: Cardiovascular Disease

## 2017-01-13 ENCOUNTER — Other Ambulatory Visit: Payer: Self-pay

## 2017-01-13 DIAGNOSIS — I2510 Atherosclerotic heart disease of native coronary artery without angina pectoris: Secondary | ICD-10-CM

## 2017-01-13 MED ORDER — FUROSEMIDE 40 MG PO TABS: ORAL_TABLET | ORAL | 0 refills | Status: DC

## 2017-01-13 NOTE — Telephone Encounter (Signed)
°*  STAT* If patient is at the pharmacy, call can be transferred to refill team.   1. Which medications need to be refilled? (please list name of each medication and dose if known) fluorescamine 40mg    2. Which pharmacy/location (including street and city if local pharmacy) is medication to be sent to? [facility] on [address]   3. Do they need a 30 day or 90 day supply? 7-10-days

## 2017-01-16 ENCOUNTER — Other Ambulatory Visit: Payer: Self-pay | Admitting: *Deleted

## 2017-01-16 DIAGNOSIS — I2510 Atherosclerotic heart disease of native coronary artery without angina pectoris: Secondary | ICD-10-CM

## 2017-01-16 MED ORDER — FUROSEMIDE 40 MG PO TABS: ORAL_TABLET | ORAL | 11 refills | Status: DC

## 2017-01-16 NOTE — Telephone Encounter (Signed)
Pt said to please call in her Furosemide to [facility].Please call this in today if possible.

## 2017-01-31 ENCOUNTER — Ambulatory Visit (INDEPENDENT_AMBULATORY_CARE_PROVIDER_SITE_OTHER): Payer: Medicare FFS

## 2017-01-31 DIAGNOSIS — Z95810 Presence of automatic (implantable) cardiac defibrillator: Secondary | ICD-10-CM | POA: Diagnosis not present

## 2017-01-31 DIAGNOSIS — I5022 Chronic systolic (congestive) heart failure: Secondary | ICD-10-CM | POA: Diagnosis not present

## 2017-01-31 NOTE — Progress Notes (Signed)
EPIC Encounter for ICM Monitoring  Patient Name: [name] is a 75 y.o. female Date: 01/31/2017 Primary Care Physican: Nicola Girt, DO Primary [place] Electrophysiologist: Caryl Comes Dry Weigunknown Bi-V Pacing: >99%       Heart Failure questions reviewed, pt asymptomatic.   Thoracic impedance normal but was abnormal suggesting fluid accumulation 12/29/2016 to 01/18/2017.  Prescribed dosage: Furosemide 40 mg 2 tablets (80 mg total) every AMand 1 tablet (40 mg total) every PM. Potassium 20 mEq 1 tablet daily.  Labs: 09/19/2016 Creatinine 1.39, BUN 36, Potassium 4.1, Sodium 125, EGFR 37-45 Care Everywhere results 09/13/2016 Creatinine 1.51, BUN 30, Potassium 4.6, Sodium 128, EGFR 34-41 Care Everywhere results 08/31/2016 Creatinine 1.40, BUN 16, Potassium 4.3, Sodium 137, EGFR 36-42 06/09/2016 Creatinine 1.51, BUN 23, Potassium 4.5, Sodium 135 Care Everywhere results 06/01/2016 Creatinine 1.07, BUN 5, Potassium 3.7, Sodium 143, EGFR 50->60 Care Everywhere results 05/31/2016 Creatinine 1.13, BUN 8, Potassium 3.4, Sodium 145, EGFR 47-57 Care Everywhere results 05/30/2016 Creatinine 1.25, BUN 12, Potassium 3.6, Sodium 137, EGFR 42-51 Care Everywhere results 05/05/2016 Creatinine 1.45, BUN 21, Potassium 4.5, Sodium 135 Care Everywhere results 05/26/2015 Creatinine 1.36, BUN 17, Potassium 4.5, Sodium 140  Recommendations: No changes.   Encouraged to call for fluid symptoms.  Follow-up plan: ICM clinic phone appointment on 03/03/2017.    Copy of ICM check sent to Dr. Caryl Comes.   3 month ICM trend: 01/31/2017    1 Year ICM trend:       [name], RN 01/31/2017 5:53 PM

## 2017-02-15 ENCOUNTER — Encounter: Payer: Self-pay | Admitting: Physician Assistant

## 2017-02-15 ENCOUNTER — Ambulatory Visit: Payer: Medicare FFS | Admitting: Physician Assistant

## 2017-02-15 ENCOUNTER — Other Ambulatory Visit: Payer: Self-pay

## 2017-02-15 VITALS — BP 108/60 | HR 98 | Temp 97.7°F | Resp 18 | Ht 65.0 in | Wt 170.6 lb

## 2017-02-15 DIAGNOSIS — R05 Cough: Secondary | ICD-10-CM | POA: Diagnosis not present

## 2017-02-15 DIAGNOSIS — L03115 Cellulitis of right lower limb: Secondary | ICD-10-CM

## 2017-02-15 DIAGNOSIS — E669 Obesity, unspecified: Secondary | ICD-10-CM | POA: Insufficient documentation

## 2017-02-15 DIAGNOSIS — M79671 Pain in right foot: Secondary | ICD-10-CM

## 2017-02-15 DIAGNOSIS — R059 Cough, unspecified: Secondary | ICD-10-CM

## 2017-02-15 MED ORDER — DOXYCYCLINE HYCLATE 100 MG PO CAPS: 100.0000 mg | ORAL_CAPSULE | Freq: Two times a day (BID) | ORAL | 0 refills | Status: AC

## 2017-02-15 NOTE — Progress Notes (Signed)
Patient ID: Margareth Kanner Viramontes, female     DOB: Feb 23, 1941, 76 y.o.    MRN: [id number]  PCP: Nicola Girt, DO  Chief Complaint  Patient presents with  . Foot Pain    x3 days, right foot, pt thinks she may have stepped on something but isn't sure. Pt states foot is swollen and red and pain is going up the leg.  . Establish Care    Subjective:   This patient is new to me and presents for evaluation of RIGHT foot pain and swelling and redness.  She has recently moved back to [place] and would like to return to this practice after many years, due to the convenience.  She has multiple significant chronic medical problems, but today is concerned about the RIGHT foot. She first noticed swelling 3 days ago. Progressively worsening. Pain now rated 7-8/10. Sometimes shoots up the leg. No fever, chills. She applied a bandage to the area of initial redness under the great toe, and when she removed it today there was purulent material on it. The pain is worse with standing/walking, and she sometimes limps.  The day prior to the onset of symptoms, she broke a saucer on the floor. She thought she'd collected all the shards, but found a small piece with blood on it. She suspects that she stepped on it, but doesn't recall a wound.  Review of Systems Constitutional: Positive for fatigue (she is usually tired a lot). Negative for appetite change, fever and unexpected weight change.  HENT: Positive for rhinorrhea and sinus pain (left frontal sinus). Negative for congestion and sore throat.   Eyes: Negative for pain.  Respiratory: Positive for cough (for two weeks). Negative for shortness of breath.   Cardiovascular: Negative for chest pain and palpitations.  Gastrointestinal: Negative for abdominal pain, constipation, diarrhea, nausea and vomiting.  Genitourinary: Negative for dysuria, frequency and urgency.  Musculoskeletal: Positive for back pain (patient has had back surgery and is on pain  medication for it).  Skin: Negative for rash.  Neurological: Negative for light-headedness and headaches.     Prior to Admission medications   Medication Sig Start Date End Date Taking? Authorizing Provider  ACCU-CHEK AVIVA PLUS test strip 1 each by Other route as needed (glucose).  05/15/15  Yes [provider]  ALPRAZolam Duanne Moron) 1 MG tablet Take 1 mg by mouth 3 (three) times daily as needed for anxiety or sleep.    Yes [provider]  aspirin EC 81 MG tablet Take 81 mg by mouth at bedtime.   Yes [provider]  carvedilol (COREG) 12.5 MG tablet TAKE 1 AND 1/2 TABLETS TWICE DAILY WITH A MEAL  (DOSE  INCREASE) 08/02/16  Yes Weaver, Scott T, PA-C  citalopram (CELEXA) 40 MG tablet Take 1 tablet by mouth daily. 10/18/16  Yes [provider]  clopidogrel (PLAVIX) 75 MG tablet TAKE 1 TABLET EVERY DAY 10/24/16  Yes Croitoru, Mihai, MD  furosemide (LASIX) 40 MG tablet TAKE 2 TABLETS EVERY MORNING  AND TAKE 1 TABLET EVERY EVENING 01/16/17  Yes Lorretta Harp, MD  Insulin Detemir (LEVEMIR) 100 UNIT/ML Pen Inject 26 Units into the skin daily at 10 pm.   Yes [provider]  levothyroxine (SYNTHROID, LEVOTHROID) 112 MCG tablet Take 112 mcg by mouth at bedtime.    Yes [provider]  MELATONIN PO Take 2 tablets by mouth at bedtime.   Yes [provider]  nitroGLYCERIN (NITROSTAT) 0.4 MG SL tablet Place 1  tablet (0.4 mg total) under the tongue every 5 (five) minutes as needed for chest pain. 05/01/13  Yes Brett Canales, PA-C  oxyCODONE-acetaminophen (PERCOCET/ROXICET) 5-325 MG tablet Take 1 tablet by mouth every 6 (six) hours as needed for severe pain.    Yes [provider]  pantoprazole (PROTONIX) 40 MG tablet Take 40 mg by mouth 2 (two) times daily.  04/30/13  Yes [provider]  potassium chloride SA (K-DUR,KLOR-CON) 20 MEQ tablet TAKE 1 TABLET EVERY DAY  (DOSE  INCREASE) 06/03/16  Yes Lorretta Harp, MD  simvastatin  (ZOCOR) 20 MG tablet Take 20 mg by mouth daily with supper.    Yes [provider]  cetirizine (ZYRTEC) 10 MG tablet Take 10 mg by mouth daily.    [provider]     Allergies  Allergen Reactions  . Valium [Diazepam] Swelling    face     Patient Active Problem List   Diagnosis Date Noted  . Obesity 02/15/2017  . History of laryngeal cancer 06/09/2016  . S/P coronary artery stent placement 05/30/2016  . Tachycardia 05/26/2015  . Syncope 05/26/2015  . ICD (implantable cardioverter-defibrillator), biventricular, in situ 05/26/2015  . Chronic kidney disease, stage III (moderate) ([place]) 04/10/2015  . Esophagitis, reflux 04/10/2015  . Vitamin D deficiency 04/10/2015  . Cough 03/10/2015  . Episode of syncope 03/10/2015  . Chronic pain   . Chronic systolic CHF (congestive heart failure) ([place])   . Diabetes mellitus ([place])   . Hypertension   . Coronary artery disease involving native coronary artery   . Left ventricular dysfunction   . Dyspnea on exertion   . Chest pain 05/01/2013  . Hyperlipidemia 12/26/2012  . Diabetes ([place]) 12/26/2012  . Tobacco abuse 12/26/2012  . Block, bundle branch, left 12/26/2012  . Type 2 diabetes mellitus with stage 3 chronic kidney disease, with long-term current use of insulin ([place]) 12/26/2012  . Essential hypertension 10/03/2008  . SLEEP APNEA 10/03/2008  . Chronic obstructive pulmonary disease ([place]) 10/03/2008     Family History  Problem Relation Age of Onset  . Stroke Mother   . Hypertension Mother   . Coronary artery disease Father   . Stroke Brother   . Heart disease Brother   . Other Brother        H1N1 VIRUS  . Healthy Sister   . Healthy Sister      Social History   Socioeconomic History  . Marital status: Married    Spouse name: Not on file  . Number of children: Not on file  . Years of education: Not on file  . Highest education level: Not on file  Social Needs  . Financial resource strain: Not on file    . Food insecurity - worry: Not on file  . Food insecurity - inability: Not on file  . Transportation needs - medical: Not on file  . Transportation needs - non-medical: Not on file  Occupational History  . Not on file  Tobacco Use  . Smoking status: Former Smoker    Packs/day: 1.00    Years: 58.00    Pack years: 58.00    Types: E-cigarettes    Start date: 12/26/1952    Last attempt to quit: 12/05/2013    Years since quitting: 3.2  . Smokeless tobacco: Never Used  . Tobacco comment: uses Vape cigarettes  Substance and Sexual Activity  . Alcohol use: No    Alcohol/week: 0.0 oz  . Drug use: No  . Sexual activity:  Not on file  Other Topics Concern  . Not on file  Social History Narrative  . Not on file         Objective:  Physical Exam  Constitutional: She is oriented to person, place, and time. She appears well-developed and well-nourished. She is active and cooperative. No distress.  BP 108/60 (BP Location: Right Arm, Patient Position: Sitting, Cuff Size: Large)   Pulse 98   Temp 97.7 F (36.5 C) (Oral)   Resp 18   Ht 5\' 5"  (1.651 m)   Wt 170 lb 9.6 oz (77.4 kg)   SpO2 96%   BMI 28.39 kg/m   HENT:  Head: Normocephalic and atraumatic.  Right Ear: Hearing, tympanic membrane, external ear and ear canal normal.  Left Ear: Hearing, tympanic membrane, external ear and ear canal normal.  Nose: Mucosal edema present. No rhinorrhea or nose lacerations. Right sinus exhibits no maxillary sinus tenderness and no frontal sinus tenderness. Left sinus exhibits no maxillary sinus tenderness and no frontal sinus tenderness.  Mouth/Throat: Uvula is midline, oropharynx is clear and moist and mucous membranes are normal.  LEFT ear hearing aid  Eyes: Conjunctivae are normal. No scleral icterus.  Neck: Normal range of motion and phonation normal. Neck supple. No thyromegaly present.  Cardiovascular: Normal rate, regular rhythm and normal heart sounds.  Pulses:      Radial pulses are  2+ on the right side, and 2+ on the left side.  Pulmonary/Chest: Effort normal and breath sounds normal.  Lymphadenopathy:       Head (right side): No tonsillar, no preauricular, no posterior auricular and no occipital adenopathy present.       Head (left side): No tonsillar, no preauricular, no posterior auricular and no occipital adenopathy present.    She has no cervical adenopathy.       Right: No supraclavicular adenopathy present.       Left: No supraclavicular adenopathy present.  Neurological: She is alert and oriented to person, place, and time. No sensory deficit.  Skin: Skin is warm, dry and intact. Lesion noted. No rash noted. There is erythema. No cyanosis. Nails show no clubbing.  Erythema and edema of the RIGHT foot and lower leg, most prominent about the great tow. On the planter surface of the RIGHT great toe is evidence of a large pustule, apparently spontaneously drained through a linear defect consistent with a laceration from a sharp object. There is no additional purulence expressed. No induration. Very tender. Apparent mild-to-moderate venous stasis dermatitis of the lower extremities. Capillary refill <3 seconds.  Psychiatric: She has a normal mood and affect. Her speech is normal and behavior is normal.       Assessment & Plan:  1. Right foot pain 2. Cellulitis of right lower extremity 3. Cough Start doxycycline. Rest. Warm compress/soaks. Reassess in 48 hours, sooner if worsening. Given multiple co-morbidities, would refer to specialty care if not significantly improved at recheck.  Cough is unrelated, and could represent COPD exacerbation vs viral illness. Should be covered by doxycycline. If worsens, would consider decompensation of CHF as possible cause. - doxycycline (VIBRAMYCIN) 100 MG capsule; Take 1 capsule (100 mg total) by mouth 2 (two) times daily for 10 days.  Dispense: 20 capsule; Refill: 0    Return in about 2 days (around 02/17/2017) for re-evalaution  of cellulitis, THEN needs visit for evaluation of diabetes and fasting labs in 2-4 wee.   Fara Chute, PA-C [facility]

## 2017-02-15 NOTE — Patient Instructions (Addendum)
Apply a warm compress to your foot for 15-20 minutes 2-4 times each day. Try to minimize being up and about.    IF you received an x-ray today, you will receive an invoice from [facility]. Please contact [facility] at [phone] with questions or concerns regarding your invoice.   IF you received labwork today, you will receive an invoice from [facility]. Please contact [facility] at [phone] with questions or concerns regarding your invoice.   Our billing staff will not be able to assist you with questions regarding bills from these companies.  You will be contacted with the lab results as soon as they are available. The fastest way to get your results is to activate your My Chart account. Instructions are located on the last page of this paperwork. If you have not heard from [place] regarding the results in 2 weeks, please contact this office.

## 2017-02-15 NOTE — Progress Notes (Signed)
Chief Complaint  Patient presents with  . Foot Pain    x3 days, right foot, pt thinks she may have stepped on something but isn't sure. Pt states foot is swollen and red and pain is going up the leg.  . Establish Care   Subjective:    Patient ID: [name], female    DOB: 01-07-1941, 76 y.o.   MRN: [id number]  HPI Patient presents to establish care and for evaluation of right foot pain.   Patient has recently moved to [place].  Patient states that she noticed swelling in her right foot on 02/13/17. Swelling has gotten worse since then. She states that she's not sure if she stepped on something. She dropped and broke a saucer on 02/12/17. She thought she she picked it all up but found a pice of glass on the floor with blood in it on 02/13/17. The pain occasionally goes up the right lower leg. Pain is now at a 7-8/10. Standing on it makes it worse. She is able to walk without any assistive devices but she is limping occasionally.    Patient states that she has also been coughing a lot for the past two weeks. Cough is productive with yellow sputum. She is also having a runny nose and pain in her left frontal sinuses.  Review of Systems  Constitutional: Positive for fatigue (she is usually tired a lot). Negative for appetite change, fever and unexpected weight change.  HENT: Positive for rhinorrhea and sinus pain (left frontal sinus). Negative for congestion and sore throat.   Eyes: Negative for pain.  Respiratory: Positive for cough (for two weeks). Negative for shortness of breath.   Cardiovascular: Negative for chest pain and palpitations.  Gastrointestinal: Negative for abdominal pain, constipation, diarrhea, nausea and vomiting.  Genitourinary: Negative for dysuria, frequency and urgency.  Musculoskeletal: Positive for back pain (patient has had back surgery and is on pain medication for it).  Skin: Negative for rash.  Neurological: Negative for light-headedness and headaches.    Patient Active Problem List   Diagnosis Date Noted  . Obesity 02/15/2017  . History of laryngeal cancer 06/09/2016  . S/P coronary artery stent placement 05/30/2016  . Tachycardia 05/26/2015  . Syncope 05/26/2015  . ICD (implantable cardioverter-defibrillator), biventricular, in situ 05/26/2015  . Chronic kidney disease, stage III (moderate) ([place]) 04/10/2015  . Esophagitis, reflux 04/10/2015  . Vitamin D deficiency 04/10/2015  . Cough 03/10/2015  . Chronic pain   . Chronic systolic CHF (congestive heart failure) ([place])   . Coronary artery disease involving native coronary artery   . Left ventricular dysfunction   . Dyspnea on exertion   . Chest pain 05/01/2013  . Hyperlipidemia 12/26/2012  . Tobacco abuse 12/26/2012  . Block, bundle branch, left 12/26/2012  . Type 2 diabetes mellitus with stage 3 chronic kidney disease, with long-term current use of insulin ([place]) 12/26/2012  . Essential hypertension 10/03/2008  . SLEEP APNEA 10/03/2008  . Chronic obstructive pulmonary disease ([place]) 10/03/2008   Current Outpatient Medications on File Prior to Visit  Medication Sig Dispense Refill  . ACCU-CHEK AVIVA PLUS test strip 1 each by Other route as needed (glucose).     Marland Kitchen ALPRAZolam (XANAX) 1 MG tablet Take 1 mg by mouth 3 (three) times daily as needed for anxiety or sleep.     Marland Kitchen aspirin EC 81 MG tablet Take 81 mg by mouth at bedtime.    . carvedilol (COREG) 12.5 MG tablet TAKE 1 AND 1/2 TABLETS TWICE DAILY  WITH A MEAL  (DOSE  INCREASE) 270 tablet 1  . citalopram (CELEXA) 40 MG tablet Take 1 tablet by mouth daily.    . clopidogrel (PLAVIX) 75 MG tablet TAKE 1 TABLET EVERY DAY 90 tablet 1  . furosemide (LASIX) 40 MG tablet TAKE 2 TABLETS EVERY MORNING  AND TAKE 1 TABLET EVERY EVENING 90 tablet 11  . Insulin Detemir (LEVEMIR) 100 UNIT/ML Pen Inject 26 Units into the skin daily at 10 pm.    . levothyroxine (SYNTHROID, LEVOTHROID) 112 MCG tablet Take 112 mcg by mouth at bedtime.     Marland Kitchen  MELATONIN PO Take 2 tablets by mouth at bedtime.    . nitroGLYCERIN (NITROSTAT) 0.4 MG SL tablet Place 1 tablet (0.4 mg total) under the tongue every 5 (five) minutes as needed for chest pain. 25 tablet 3  . oxyCODONE-acetaminophen (PERCOCET/ROXICET) 5-325 MG tablet Take 1 tablet by mouth every 6 (six) hours as needed for severe pain.     . pantoprazole (PROTONIX) 40 MG tablet Take 40 mg by mouth 2 (two) times daily.     . potassium chloride SA (K-DUR,KLOR-CON) 20 MEQ tablet TAKE 1 TABLET EVERY DAY  (DOSE  INCREASE) 90 tablet 3  . simvastatin (ZOCOR) 20 MG tablet Take 20 mg by mouth daily with supper.     . cetirizine (ZYRTEC) 10 MG tablet Take 10 mg by mouth daily.     No current facility-administered medications on file prior to visit.    Allergies  Allergen Reactions  . Valium [Diazepam] Swelling    face   Past Surgical History:  Procedure Laterality Date  . BACK SURGERY  2013  . BI-VENTRICULAR IMPLANTABLE CARDIOVERTER DEFIBRILLATOR N/A 06/11/2014   Procedure: BI-VENTRICULAR IMPLANTABLE CARDIOVERTER DEFIBRILLATOR  (CRT-D);  Surgeon: Deboraha Sprang, MD; [facility] [phone] ICD serial [id number]   . CARDIAC CATHETERIZATION  03/31/2010   Moderate-noncritical CAD  . CARDIAC CATHETERIZATION  05/04/2007   Recommended medical therapy  . CARDIAC CATHETERIZATION  12/14/2006   Recommended stenting of RCA  . CARDIAC CATHETERIZATION  12/14/2006   RCA stented with a 3.0 [employer] Liberte stent resulting in a reduction of 75% to 0% residual  . CARDIAC CATHETERIZATION  05/14/2004   Recommending medical therapy  . CARDIAC CATHETERIZATION  08/26/1999   No intervention  . CHOLECYSTECTOMY     20 years ago  . COLONOSCOPY WITH PROPOFOL N/A 12/15/2014   Procedure: COLONOSCOPY WITH PROPOFOL;  Surgeon: Clarene Essex, MD;  Location: [facility] ENDOSCOPY;  Service: Endoscopy;  Laterality: N/A;  . EYE SURGERY     bilateral cataract surgery   . LEFT AND RIGHT HEART CATHETERIZATION WITH CORONARY ANGIOGRAM N/A 05/29/2014    Procedure: LEFT AND RIGHT HEART CATHETERIZATION WITH CORONARY ANGIOGRAM;  Surgeon: Lorretta Harp, MD;  Location[facility] CATH LAB;  Service: Cardiovascular;  Laterality: N/A;  . LEFT HEART CATHETERIZATION WITH CORONARY ANGIOGRAM N/A 12/27/2012   Procedure: LEFT HEART CATHETERIZATION WITH CORONARY ANGIOGRAM;  Surgeon: Lorretta Harp, MD;  Location: [facility] CATH LAB;  Service: Cardiovascular;  Laterality: N/A;   Social History   Socioeconomic History  . Marital status: Married    Spouse name: Not on file  . Number of children: Not on file  . Years of education: Not on file  . Highest education level: Not on file  Social Needs  . Financial resource strain: Not on file  . Food insecurity - worry: Not on file  . Food insecurity - inability: Not on file  . Transportation needs - medical: Not  on file  . Transportation needs - non-medical: Not on file  Occupational History  . Not on file  Tobacco Use  . Smoking status: Former Smoker    Packs/day: 1.00    Years: 58.00    Pack years: 58.00    Types: E-cigarettes    Start date: 12/26/1952    Last attempt to quit: 12/05/2013    Years since quitting: 3.2  . Smokeless tobacco: Never Used  . Tobacco comment: uses Vape cigarettes  Substance and Sexual Activity  . Alcohol use: No    Alcohol/week: 0.0 oz  . Drug use: No  . Sexual activity: Not on file  Other Topics Concern  . Not on file  Social History Narrative   Patient lives in [place] with his husband and grandson.      Objective:  Vitals:   02/15/17 1556  BP: 108/60  Pulse: 98  Resp: 18  Temp: 97.7 F (36.5 C)  SpO2: 96%     Physical Exam  Constitutional: She appears well-developed and well-nourished.  HENT:  Head: Normocephalic and atraumatic.  Right Ear: External ear normal.  Left Ear: External ear normal.  Nose: Nose normal.  Mouth/Throat: Oropharynx is clear and moist.  Hearing aid present in left ear  Eyes: Conjunctivae are normal.  Cardiovascular: Normal rate,  regular rhythm, normal heart sounds and intact distal pulses.  Pulmonary/Chest: Effort normal and breath sounds normal.  Musculoskeletal:  Right foot is erythema and edema noted. Laceration present on the bottom of the right big toe. Edema extends to right lower leg (1+ pitting edema)  Lymphadenopathy:    She has no cervical adenopathy.  Neurological: She is alert.  Skin: Skin is warm.  Psychiatric: She has a normal mood and affect.      Assessment & Plan:  1. Right foot pain Is most likely due to cellulitis. Started empiric treatment today. Patient instructed to rest the foot and apply warm compresses to her foot 2-4 times each day for 15-20 minutes.  2. Cellulitis of right lower extremity - doxycycline (VIBRAMYCIN) 100 MG capsule; Take 1 capsule (100 mg total) by mouth 2 (two) times daily for 10 days.  Dispense: 20 capsule; Refill: 0  Follow up in 2 days for re-evaluation of cellulitis, then schedule a visit in 2-4 weeks for re-valuation of diabetes and fasting labs.   [place], [place]

## 2017-02-17 ENCOUNTER — Ambulatory Visit: Payer: Medicare FFS | Admitting: Family Medicine

## 2017-02-20 ENCOUNTER — Other Ambulatory Visit: Payer: Self-pay | Admitting: Physician Assistant

## 2017-02-20 DIAGNOSIS — I2510 Atherosclerotic heart disease of native coronary artery without angina pectoris: Secondary | ICD-10-CM

## 2017-02-21 ENCOUNTER — Ambulatory Visit: Payer: Medicare FFS | Admitting: Family Medicine

## 2017-03-03 ENCOUNTER — Telehealth: Payer: Self-pay | Admitting: Cardiology

## 2017-03-03 ENCOUNTER — Ambulatory Visit (INDEPENDENT_AMBULATORY_CARE_PROVIDER_SITE_OTHER): Payer: Medicare FFS

## 2017-03-03 DIAGNOSIS — I5022 Chronic systolic (congestive) heart failure: Secondary | ICD-10-CM

## 2017-03-03 DIAGNOSIS — Z95810 Presence of automatic (implantable) cardiac defibrillator: Secondary | ICD-10-CM

## 2017-03-03 NOTE — Telephone Encounter (Signed)
LMOVM reminding pt to send remote transmission.   

## 2017-03-09 NOTE — Progress Notes (Signed)
EPIC Encounter for ICM Monitoring  Patient Name: [name] is a 77 y.o. female Date: 03/09/2017 Primary Care Physican: Nicola Girt, DO Primary [place] Electrophysiologist: Caryl Comes Dry [place] Bi-V Pacing: >99%      Transmission reviewed.    Thoracic impedance normal.  Prescribed dosage: Furosemide 40 mg 2 tablets (80 mg total) every AMand 1 tablet (40 mg total) every PM. Potassium 20 mEq 1 tablet daily.  Labs: 09/19/2016 Creatinine 1.39, BUN 36, Potassium 4.1, Sodium 125, EGFR 37-45 Care Everywhere results 09/13/2016 Creatinine 1.51, BUN 30, Potassium 4.6, Sodium 128, EGFR 34-41 Care Everywhere results 08/31/2016 Creatinine 1.40, BUN 16, Potassium 4.3, Sodium 137, EGFR 36-42 06/09/2016 Creatinine 1.51, BUN 23, Potassium 4.5, Sodium 135 Care Everywhere results 06/01/2016 Creatinine 1.07, BUN 5, Potassium 3.7, Sodium 143, EGFR 50->60 Care Everywhere results 05/31/2016 Creatinine 1.13, BUN 8, Potassium 3.4, Sodium 145, EGFR 47-57 Care Everywhere results 05/30/2016 Creatinine 1.25, BUN 12, Potassium 3.6, Sodium 137, EGFR 42-51 Care Everywhere results 05/05/2016 Creatinine 1.45, BUN 21, Potassium 4.5, Sodium 135 Care Everywhere results 05/26/2015 Creatinine 1.36, BUN 17, Potassium 4.5, Sodium 140  Recommendations:   None.  Follow-up plan: ICM clinic phone appointment on 04/06/2017.    Copy of ICM check sent to Dr. Caryl Comes.   3 month ICM trend: 03/03/2017    1 Year ICM trend:       [name], RN 03/09/2017 11:35 AM

## 2017-03-24 NOTE — Progress Notes (Deleted)
Electrophysiology Office Note Date: 03/28/2017  ID:  [name], DOB 31-Mar-1940, MRN [id number]  PCP: Nicola Girt, DO Primary Cardiologist: Gwenlyn Found Electrophysiologist: Caryl Comes  CC: Routine ICD follow-up  [name] is a 77 y.o. female seen today for Dr Caryl Comes.  She underwent CRTD implantation in April of this year and presents today for routine EP follow up.  She reports since CRT implant, her HF symptoms are markedly improved.  She was seen by Dr Gwenlyn Found recently who ordered an echo for reassessment of EF -not yet done. She has LE claudication but denies chest pain, palpitations, dyspnea, PND, orthopnea, nausea, vomiting, dizziness, syncope, edema, weight gain, or early satiety.  She has not had ICD shocks.   Device History: STJ CRTD implanted 2016 for NICM, CHF, LBBB History of appropriate therapy: No History of AAD therapy: No   Past Medical History:  Diagnosis Date  . AICD (automatic cardioverter/defibrillator) present    [place] [id number] ICD serial [phone]  . Anemia    as teenager  . Anxiety   . Arthritis   . Biventricular ICD (implantable cardioverter-defibrillator) in place   . Chronic pain    a. Prior h/o chronic pain on methadone.  . Chronic systolic CHF (congestive heart failure) (HCC)    a. mixed ischemic/non-ischemic cardiomyopathy. Varying EF over the years but most recently 20%, s/p CRT-D in 06/2014.  Marland Kitchen CKD (chronic kidney disease), stage III ([place])   . Complication of anesthesia    hard time waking her up from general surgery  . Coronary artery disease    a. remote RCA stenting in 2008 with non-DES. b. Cath 06/2014 following abnormal nuc: Stable, unchanged from prior cath, patent stent and 40% LM.  . Diabetes mellitus ([place])   . GERD (gastroesophageal reflux disease)   . Headache   . Hyperlipidemia   . Hypertension   . LBBB (left bundle branch block)   . Nonischemic cardiomyopathy ([place])   . OSA (obstructive sleep apnea)    AHI-9.77/hr, during REM-50.32/hr   . Syncope    2D ECHO, 08/21/2009 - EF 45-50%, normal (but later EFs abnormal)  . Syncope and collapse    CAROTID DOPPLER, 08/21/2009 - no significant stenosis demonstrated  . Tobacco abuse    Past Surgical History:  Procedure Laterality Date  . BACK SURGERY  2013  . BI-VENTRICULAR IMPLANTABLE CARDIOVERTER DEFIBRILLATOR N/A 06/11/2014   Procedure: BI-VENTRICULAR IMPLANTABLE CARDIOVERTER DEFIBRILLATOR  (CRT-D);  Surgeon: Deboraha Sprang, MD; [facility] [phone] ICD serial [id number]   . CARDIAC CATHETERIZATION  03/31/2010   Moderate-noncritical CAD  . CARDIAC CATHETERIZATION  05/04/2007   Recommended medical therapy  . CARDIAC CATHETERIZATION  12/14/2006   Recommended stenting of RCA  . CARDIAC CATHETERIZATION  12/14/2006   RCA stented with a 3.0 [employer] Liberte stent resulting in a reduction of 75% to 0% residual  . CARDIAC CATHETERIZATION  05/14/2004   Recommending medical therapy  . CARDIAC CATHETERIZATION  08/26/1999   No intervention  . CHOLECYSTECTOMY     20 years ago  . COLONOSCOPY WITH PROPOFOL N/A 12/15/2014   Procedure: COLONOSCOPY WITH PROPOFOL;  Surgeon: Clarene Essex, MD;  Location: [facility];  Service: Endoscopy;  Laterality: N/A;  . EYE SURGERY     bilateral cataract surgery   . LEFT AND RIGHT HEART CATHETERIZATION WITH CORONARY ANGIOGRAM N/A 05/29/2014   Procedure: LEFT AND RIGHT HEART CATHETERIZATION WITH CORONARY ANGIOGRAM;  Surgeon: Lorretta Harp, MD;  Location: [facility] CATH LAB;  Service: Cardiovascular;  Laterality:  N/A;  . LEFT HEART CATHETERIZATION WITH CORONARY ANGIOGRAM N/A 12/27/2012   Procedure: LEFT HEART CATHETERIZATION WITH CORONARY ANGIOGRAM;  Surgeon: Lorretta Harp, MD;  Location: [facility] CATH LAB;  Service: Cardiovascular;  Laterality: N/A;    Current Outpatient Medications  Medication Sig Dispense Refill  . ACCU-CHEK AVIVA PLUS test strip 1 each by Other route as needed (glucose).     Marland Kitchen ALPRAZolam (XANAX) 1 MG tablet Take 1 mg by mouth 3 (three) times daily  as needed for anxiety or sleep.     Marland Kitchen aspirin EC 81 MG tablet Take 81 mg by mouth at bedtime.    . carvedilol (COREG) 12.5 MG tablet TAKE 1 AND 1/2 TABLETS TWICE DAILY WITH A MEAL  (DOSE  INCREASE) 270 tablet 1  . cetirizine (ZYRTEC) 10 MG tablet Take 10 mg by mouth daily.    . citalopram (CELEXA) 40 MG tablet Take 1 tablet by mouth daily.    . clopidogrel (PLAVIX) 75 MG tablet TAKE 1 TABLET EVERY DAY 90 tablet 1  . furosemide (LASIX) 40 MG tablet TAKE 2 TABLETS EVERY MORNING  AND TAKE 1 TABLET EVERY EVENING 90 tablet 11  . Insulin Detemir (LEVEMIR) 100 UNIT/ML Pen Inject 26 Units into the skin daily at 10 pm.    . levothyroxine (SYNTHROID, LEVOTHROID) 112 MCG tablet Take 112 mcg by mouth at bedtime.     Marland Kitchen MELATONIN PO Take 2 tablets by mouth at bedtime.    . nitroGLYCERIN (NITROSTAT) 0.4 MG SL tablet Place 1 tablet (0.4 mg total) under the tongue every 5 (five) minutes as needed for chest pain. 25 tablet 3  . oxyCODONE-acetaminophen (PERCOCET/ROXICET) 5-325 MG tablet Take 1 tablet by mouth every 6 (six) hours as needed for severe pain.     . pantoprazole (PROTONIX) 40 MG tablet Take 40 mg by mouth 2 (two) times daily.     . potassium chloride SA (K-DUR,KLOR-CON) 20 MEQ tablet TAKE 1 TABLET EVERY DAY  (DOSE  INCREASE) 90 tablet 3  . simvastatin (ZOCOR) 20 MG tablet Take 20 mg by mouth daily with supper.      No current facility-administered medications for this visit.     Allergies:   Valium [diazepam]   Social History: Social History   Socioeconomic History  . Marital status: Married    Spouse name: Not on file  . Number of children: Not on file  . Years of education: Not on file  . Highest education level: Not on file  Social Needs  . Financial resource strain: Not on file  . Food insecurity - worry: Not on file  . Food insecurity - inability: Not on file  . Transportation needs - medical: Not on file  . Transportation needs - non-medical: Not on file  Occupational History  .  Not on file  Tobacco Use  . Smoking status: Former Smoker    Packs/day: 1.00    Years: 58.00    Pack years: 58.00    Types: E-cigarettes    Start date: 12/26/1952    Last attempt to quit: 12/05/2013    Years since quitting: 3.3  . Smokeless tobacco: Never Used  . Tobacco comment: uses Vape cigarettes  Substance and Sexual Activity  . Alcohol use: No    Alcohol/week: 0.0 oz  . Drug use: No  . Sexual activity: Not on file  Other Topics Concern  . Not on file  Social History Narrative   Patient lives in [place] with his husband and grandson.  Family History: Family History  Problem Relation Age of Onset  . Stroke Mother   . Hypertension Mother   . Coronary artery disease Father   . Stroke Brother   . Heart disease Brother   . Other Brother        H1N1 VIRUS  . Healthy Sister   . Healthy Sister     Review of Systems: All other systems reviewed and are otherwise negative except as noted above.   Physical Exam: VS:  There were no vitals taken for this visit. , BMI There is no height or weight on file to calculate BMI.  GEN- The patient is elderly and obese appearing, alert and oriented x 3 today.   HEENT: normocephalic, atraumatic; sclera clear, conjunctiva pink; hearing intact; oropharynx clear; neck supple  Lungs- Clear to ausculation bilaterally, normal work of breathing.  No wheezes, rales, rhonchi Heart- Regular rate and rhythm (paced) GI- soft, non-tender, non-distended, bowel sounds present  Extremities- no clubbing, cyanosis, or edema; DP/PT/radial pulses 2+ bilaterally MS- no significant deformity or atrophy Skin- warm and dry, no rash or lesion; ICD pocket well healed; scattered ecchymosis bilateral upper extremities Psych- euthymic mood, full affect Neuro- strength and sensation are intact  ICD interrogation- reviewed in detail today,  See PACEART report  EKG:  EKG is not ordered today   Recent Labs: 08/31/2016: ALT 18; BUN 16; Creatinine, Ser 1.40;  Hemoglobin 12.7; Platelets 282; Potassium 4.3; Sodium 137   Wt Readings from Last 3 Encounters:  02/15/17 170 lb 9.6 oz (77.4 kg)  12/06/16 158 lb (71.7 kg)  08/31/16 169 lb (76.7 kg)     Other studies Reviewed: Additional studies/ records that were reviewed today include:hospital records, Dr Kennon Holter notes  Assessment and Plan:  1.  Chronic systolic dysfunction euvolemic today EF improved post CRT implant  Stable on an appropriate medical regimen Normal ICD function See Pace Art report  2.  HTN Stable No change required today  3.  CAD No recent ischemic syptoms Continue medical therapy   Current medicines are reviewed at length with the patient today.   The patient does not have concerns regarding her medicines.  The following changes were made today:  none  Labs/ tests ordered today include: none   Disposition:   Follow up with Delilah Shan, [place] [facility], Dr Caryl Comes 1 year, Dr Gwenlyn Found as scheduled    Signed, Chanetta Marshall, NP 03/28/2017 8:13 AM  [facility] [address] [place] [address] [phone] (office) [phone] (fax)

## 2017-03-29 ENCOUNTER — Encounter: Payer: Medicare FFS | Admitting: Nurse Practitioner

## 2017-04-06 ENCOUNTER — Telehealth: Payer: Self-pay | Admitting: Cardiology

## 2017-04-06 ENCOUNTER — Encounter: Payer: Medicare HMO | Admitting: *Deleted

## 2017-04-06 NOTE — Telephone Encounter (Signed)
LMOVM reminding pt to send remote transmission.   

## 2017-04-07 ENCOUNTER — Encounter: Payer: Self-pay | Admitting: Cardiology

## 2017-04-12 ENCOUNTER — Encounter: Payer: Self-pay | Admitting: Nurse Practitioner

## 2017-04-12 NOTE — Progress Notes (Deleted)
Electrophysiology Office Note Date: 04/12/2017  ID:  Dezarae Mcclaran Vogelsang, DOB 1940-05-22, MRN [id number]  PCP: Nicola Girt, DO Primary Cardiologist: Gwenlyn Found Electrophysiologist: Caryl Comes  CC: Routine ICD follow-up  Elbony M Maffei is a 77 y.o. female seen today for Dr Caryl Comes.  She presents today for routine EP follow up.  Since last being seen in clinic, she reports doing very well. She has LE claudication but denies chest pain, palpitations, dyspnea, PND, orthopnea, nausea, vomiting, dizziness, syncope, edema, weight gain, or early satiety.  She has not had ICD shocks.   Device History: STJ CRTD implanted 2016 for NICM, CHF, LBBB History of appropriate therapy: No History of AAD therapy: No   Past Medical History:  Diagnosis Date  . Anxiety   . Arthritis   . Chronic pain    a. Prior h/o chronic pain on methadone.  . Chronic systolic CHF (congestive heart failure) (HCC)    a. mixed ischemic/non-ischemic cardiomyopathy. b. s/p CRT-D in 06/2014.  Marland Kitchen CKD (chronic kidney disease), stage III ([place])   . Complication of anesthesia    hard time waking her up from general surgery  . Coronary artery disease    a. remote RCA stenting in 2008 with non-DES. b. Cath 06/2014 following abnormal nuc: Stable, unchanged from prior cath, patent stent and 40% LM.  . Diabetes mellitus ([place])   . GERD (gastroesophageal reflux disease)   . Headache   . Hyperlipidemia   . Hypertension   . LBBB (left bundle branch block)   . Nonischemic cardiomyopathy ([place])   . OSA (obstructive sleep apnea)    AHI-9.77/hr, during REM-50.32/hr  . Tobacco abuse    Past Surgical History:  Procedure Laterality Date  . BACK SURGERY  2013  . BI-VENTRICULAR IMPLANTABLE CARDIOVERTER DEFIBRILLATOR N/A 06/11/2014   STJ CRTD implanted by Dr Caryl Comes  . CARDIAC CATHETERIZATION  12/14/2006   RCA stented with a 3.0 [employer] Liberte stent resulting in a reduction of 75% to 0% residual  . CHOLECYSTECTOMY     20 years ago  .  COLONOSCOPY WITH PROPOFOL N/A 12/15/2014   Procedure: COLONOSCOPY WITH PROPOFOL;  Surgeon: Clarene Essex, MD;  [facility]: [facility];  Service: Endoscopy;  Laterality: N/A;  . EYE SURGERY     bilateral cataract surgery   . LEFT AND RIGHT HEART CATHETERIZATION WITH CORONARY ANGIOGRAM N/A 05/29/2014   Procedure: LEFT AND RIGHT HEART CATHETERIZATION WITH CORONARY ANGIOGRAM;  Surgeon: Lorretta Harp, MD;  Location: [facility] CATH LAB;  Service: Cardiovascular;  Laterality: N/A;  . LEFT HEART CATHETERIZATION WITH CORONARY ANGIOGRAM N/A 12/27/2012   Procedure: LEFT HEART CATHETERIZATION WITH CORONARY ANGIOGRAM;  Surgeon: Lorretta Harp, MD;  Location: [facility] CATH LAB;  Service: Cardiovascular;  Laterality: N/A;    Current Outpatient Medications  Medication Sig Dispense Refill  . ACCU-CHEK AVIVA PLUS test strip 1 each by Other route as needed (glucose).     Marland Kitchen ALPRAZolam (XANAX) 1 MG tablet Take 1 mg by mouth 3 (three) times daily as needed for anxiety or sleep.     Marland Kitchen aspirin EC 81 MG tablet Take 81 mg by mouth at bedtime.    . carvedilol (COREG) 12.5 MG tablet TAKE 1 AND 1/2 TABLETS TWICE DAILY WITH A MEAL  (DOSE  INCREASE) 270 tablet 1  . cetirizine (ZYRTEC) 10 MG tablet Take 10 mg by mouth daily.    . citalopram (CELEXA) 40 MG tablet Take 1 tablet by mouth daily.    . clopidogrel (PLAVIX) 75 MG tablet TAKE  1 TABLET EVERY DAY 90 tablet 1  . furosemide (LASIX) 40 MG tablet TAKE 2 TABLETS EVERY MORNING  AND TAKE 1 TABLET EVERY EVENING 90 tablet 11  . Insulin Detemir (LEVEMIR) 100 UNIT/ML Pen Inject 26 Units into the skin daily at 10 pm.    . levothyroxine (SYNTHROID, LEVOTHROID) 112 MCG tablet Take 112 mcg by mouth at bedtime.     Marland Kitchen MELATONIN PO Take 2 tablets by mouth at bedtime.    . nitroGLYCERIN (NITROSTAT) 0.4 MG SL tablet Place 1 tablet (0.4 mg total) under the tongue every 5 (five) minutes as needed for chest pain. 25 tablet 3  . oxyCODONE-acetaminophen (PERCOCET/ROXICET) 5-325 MG tablet Take 1 tablet  by mouth every 6 (six) hours as needed for severe pain.     . pantoprazole (PROTONIX) 40 MG tablet Take 40 mg by mouth 2 (two) times daily.     . potassium chloride SA (K-DUR,KLOR-CON) 20 MEQ tablet TAKE 1 TABLET EVERY DAY  (DOSE  INCREASE) 90 tablet 3  . simvastatin (ZOCOR) 20 MG tablet Take 20 mg by mouth daily with supper.      No current facility-administered medications for this visit.     Allergies:   Valium [diazepam]   Social History: Social History   Socioeconomic History  . Marital status: Married    Spouse name: Not on file  . Number of children: Not on file  . Years of education: Not on file  . Highest education level: Not on file  Social Needs  . Financial resource strain: Not on file  . Food insecurity - worry: Not on file  . Food insecurity - inability: Not on file  . Transportation needs - medical: Not on file  . Transportation needs - non-medical: Not on file  Occupational History  . Not on file  Tobacco Use  . Smoking status: Former Smoker    Packs/day: 1.00    Years: 58.00    Pack years: 58.00    Types: E-cigarettes    Start date: 12/26/1952    Last attempt to quit: 12/05/2013    Years since quitting: 3.3  . Smokeless tobacco: Never Used  . Tobacco comment: uses Vape cigarettes  Substance and Sexual Activity  . Alcohol use: No    Alcohol/week: 0.0 oz  . Drug use: No  . Sexual activity: Not on file  Other Topics Concern  . Not on file  Social History Narrative   Patient lives in [place] with his husband and grandson.    Family History: Family History  Problem Relation Age of Onset  . Stroke Mother   . Hypertension Mother   . Coronary artery disease Father   . Stroke Brother   . Heart disease Brother   . Other Brother        H1N1 VIRUS  . Healthy Sister   . Healthy Sister     Review of Systems: All other systems reviewed and are otherwise negative except as noted above.   Physical Exam: VS:  There were no vitals taken for this  visit. , BMI There is no height or weight on file to calculate BMI.  GEN- The patient is elderly and obese appearing, alert and oriented x 3 today.   HEENT: normocephalic, atraumatic; sclera clear, conjunctiva pink; hearing intact; oropharynx clear; neck supple  Lungs- Clear to ausculation bilaterally, normal work of breathing.  No wheezes, rales, rhonchi Heart- Regular rate and rhythm (paced) GI- soft, non-tender, non-distended, bowel sounds present  Extremities- no clubbing,  cyanosis, or edema; DP/PT/radial pulses 2+ bilaterally MS- no significant deformity or atrophy Skin- warm and dry, no rash or lesion; ICD pocket well healed; scattered ecchymosis bilateral upper extremities Psych- euthymic mood, full affect Neuro- strength and sensation are intact  ICD interrogation- reviewed in detail today,  See PACEART report  EKG:  EKG is not ordered today.  Recent Labs: 08/31/2016: ALT 18; BUN 16; Creatinine, Ser 1.40; Hemoglobin 12.7; Platelets 282; Potassium 4.3; Sodium 137   Wt Readings from Last 3 Encounters:  02/15/17 170 lb 9.6 oz (77.4 kg)  12/06/16 158 lb (71.7 kg)  08/31/16 169 lb (76.7 kg)     Other studies Reviewed: Additional studies/ records that were reviewed today include: Dr Caryl Comes and Dr Kennon Holter notes  Assessment and Plan:  1.  Chronic systolic dysfunction euvolemic today EF normalized post CRT Stable on an appropriate medical regimen Normal ICD function See Pace Art report Continue follow up in [facility]  2.  HTN Stable No change required today  3.  CAD No recent ischemic syptoms Continue medical therapy   Current medicines are reviewed at length with the patient today.   The patient does not have concerns regarding her medicines.  The following changes were made today:  none  Labs/ tests ordered today include: none   Disposition:   Follow up with Delilah Shan, [facility], Dr Gwenlyn Found as scheduled, Dr Caryl Comes 1 year    Signed, Chanetta Marshall, NP 04/12/2017 8:10  AM  [facility] [address] [place] [address] [phone] (office) [phone] (fax)

## 2017-04-13 ENCOUNTER — Encounter: Payer: Medicare FFS | Admitting: Nurse Practitioner

## 2017-04-13 NOTE — Progress Notes (Signed)
No ICM remote transmission received for 04/06/2017 and next ICM transmission scheduled for 05/01/2017.

## 2017-04-17 ENCOUNTER — Ambulatory Visit (INDEPENDENT_AMBULATORY_CARE_PROVIDER_SITE_OTHER): Payer: Medicare HMO | Admitting: *Deleted

## 2017-04-17 DIAGNOSIS — I428 Other cardiomyopathies: Secondary | ICD-10-CM | POA: Diagnosis not present

## 2017-04-18 NOTE — Progress Notes (Signed)
Remote ICD transmission.   

## 2017-04-19 ENCOUNTER — Encounter: Payer: Self-pay | Admitting: Cardiology

## 2017-04-21 ENCOUNTER — Ambulatory Visit (INDEPENDENT_AMBULATORY_CARE_PROVIDER_SITE_OTHER): Payer: Medicare HMO | Admitting: Family Medicine

## 2017-04-21 ENCOUNTER — Other Ambulatory Visit: Payer: Self-pay

## 2017-04-21 ENCOUNTER — Ambulatory Visit (INDEPENDENT_AMBULATORY_CARE_PROVIDER_SITE_OTHER): Payer: Medicare HMO

## 2017-04-21 ENCOUNTER — Encounter: Payer: Self-pay | Admitting: Family Medicine

## 2017-04-21 VITALS — BP 100/64 | HR 88 | Temp 98.5°F | Ht 66.0 in | Wt 176.0 lb

## 2017-04-21 DIAGNOSIS — Z794 Long term (current) use of insulin: Secondary | ICD-10-CM | POA: Diagnosis not present

## 2017-04-21 DIAGNOSIS — M545 Low back pain, unspecified: Secondary | ICD-10-CM

## 2017-04-21 DIAGNOSIS — E1122 Type 2 diabetes mellitus with diabetic chronic kidney disease: Secondary | ICD-10-CM

## 2017-04-21 DIAGNOSIS — E039 Hypothyroidism, unspecified: Secondary | ICD-10-CM | POA: Diagnosis not present

## 2017-04-21 DIAGNOSIS — L03031 Cellulitis of right toe: Secondary | ICD-10-CM | POA: Diagnosis not present

## 2017-04-21 DIAGNOSIS — L97519 Non-pressure chronic ulcer of other part of right foot with unspecified severity: Secondary | ICD-10-CM

## 2017-04-21 DIAGNOSIS — E785 Hyperlipidemia, unspecified: Secondary | ICD-10-CM | POA: Diagnosis not present

## 2017-04-21 DIAGNOSIS — N183 Chronic kidney disease, stage 3 unspecified: Secondary | ICD-10-CM

## 2017-04-21 MED ORDER — CEPHALEXIN 500 MG PO CAPS: 500.0000 mg | ORAL_CAPSULE | Freq: Two times a day (BID) | ORAL | 0 refills | Status: DC

## 2017-04-21 MED ORDER — DICLOFENAC SODIUM 1 % TD GEL: 2.0000 g | Freq: Four times a day (QID) | TRANSDERMAL | 2 refills | Status: DC

## 2017-04-21 NOTE — Progress Notes (Signed)
2/15/201912:28 PM  [name] 08-Sep-1940, 77 y.o. female [id number]  Chief Complaint  Patient presents with  . Foot Injury    Right foot pt stepped on a peice of glass in Dec, removed herself. Received antobiotic and was told to come back in 2 days and did not return. Has been having pain for 4 days. [place] last night due to the pain    HPI:   Patient is a 77 y.o. female with past medical history significant for DM2, CAD s/p stents and pacemaker, depression w anxiety, chronic low back pain, CKD3, OSA who presents today with complains of right foot pain and would like to establish care  She reports that in dec she stepped on glass, feels she removed entire piece then. She however started having pain of right toe 4 days ago, she reports breakdown of skin at base of toe, redness and warmth. It was painful enough that she fell, denies any head trauma or LOC. She did not go to the ER.   She reports her diabetes has not been well controlled of recent, has not been following diet.   She is on prn percocet for her chronic low back pain, s/p lumbar fusion of L2-L3, rx by Dr Ellene Route  cmp 08/2016, crt 1.40, GFR  Depression screen [facility] 2/9 04/21/2017 02/15/2017  Decreased Interest 0 0  Down, Depressed, Hopeless 0 0  PHQ - 2 Score 0 0    Allergies  Allergen Reactions  . Valium [Diazepam] Swelling    face    Prior to Admission medications   Medication Sig Start Date End Date Taking? Authorizing Provider  ACCU-CHEK AVIVA PLUS test strip 1 each by Other route as needed (glucose).  05/15/15  Yes [provider]  ALPRAZolam Duanne Moron) 1 MG tablet Take 1 mg by mouth 3 (three) times daily as needed for anxiety or sleep.    Yes [provider]  aspirin EC 81 MG tablet Take 81 mg by mouth at bedtime.   Yes [provider]  carvedilol (COREG) 12.5 MG tablet TAKE 1 AND 1/2 TABLETS TWICE DAILY WITH A MEAL  (DOSE  INCREASE) 02/20/17  Yes Weaver, Scott T, PA-C  citalopram (CELEXA)  40 MG tablet Take 1 tablet by mouth daily. 10/18/16  Yes [provider]  clopidogrel (PLAVIX) 75 MG tablet TAKE 1 TABLET EVERY DAY 10/24/16  Yes Croitoru, Mihai, MD  furosemide (LASIX) 40 MG tablet TAKE 2 TABLETS EVERY MORNING  AND TAKE 1 TABLET EVERY EVENING 01/16/17  Yes Lorretta Harp, MD  Insulin Detemir (LEVEMIR) 100 UNIT/ML Pen Inject 26 Units into the skin daily at 10 pm.   Yes [provider]  levothyroxine (SYNTHROID, LEVOTHROID) 112 MCG tablet Take 112 mcg by mouth at bedtime.    Yes [provider]  MELATONIN PO Take 2 tablets by mouth at bedtime.   Yes [provider]  nitroGLYCERIN (NITROSTAT) 0.4 MG SL tablet Place 1 tablet (0.4 mg total) under the tongue every 5 (five) minutes as needed for chest pain. 05/01/13  Yes Brett Canales, PA-C  oxyCODONE-acetaminophen (PERCOCET/ROXICET) 5-325 MG tablet Take 1 tablet by mouth every 6 (six) hours as needed for severe pain.    Yes [provider]  pantoprazole (PROTONIX) 40 MG tablet Take 40 mg by mouth 2 (two) times daily.  04/30/13  Yes [provider]  potassium chloride SA (K-DUR,KLOR-CON) 20 MEQ tablet TAKE 1 TABLET EVERY DAY  (DOSE  INCREASE) 06/03/16  Yes Lorretta Harp,  MD  simvastatin (ZOCOR) 20 MG tablet Take 20 mg by mouth daily with supper.    Yes [provider]  cetirizine (ZYRTEC) 10 MG tablet Take 10 mg by mouth daily.    [provider]    Past Medical History:  Diagnosis Date  . Anxiety   . Arthritis   . Chronic pain    a. Prior h/o chronic pain on methadone.  . Chronic systolic CHF (congestive heart failure) (HCC)    a. mixed ischemic/non-ischemic cardiomyopathy. b. s/p CRT-D in 06/2014.  Marland Kitchen CKD (chronic kidney disease), stage III ([place])   . Complication of anesthesia    hard time waking her up from general surgery  . Coronary artery disease    a. remote RCA stenting in 2008 with non-DES. b. Cath 06/2014 following abnormal nuc: Stable, unchanged  from prior cath, patent stent and 40% LM.  . Diabetes mellitus ([place])   . GERD (gastroesophageal reflux disease)   . Headache   . Hyperlipidemia   . Hypertension   . LBBB (left bundle branch block)   . Nonischemic cardiomyopathy ([place])   . OSA (obstructive sleep apnea)    AHI-9.77/hr, during REM-50.32/hr  . Tobacco abuse     Past Surgical History:  Procedure Laterality Date  . BACK SURGERY  2013  . BI-VENTRICULAR IMPLANTABLE CARDIOVERTER DEFIBRILLATOR N/A 06/11/2014   STJ CRTD implanted by Dr Caryl Comes  . CARDIAC CATHETERIZATION  12/14/2006   RCA stented with a 3.0 [employer] Liberte stent resulting in a reduction of 75% to 0% residual  . CHOLECYSTECTOMY     20 years ago  . COLONOSCOPY WITH PROPOFOL N/A 12/15/2014   Procedure: COLONOSCOPY WITH PROPOFOL;  Surgeon: Clarene Essex, MD;  Location: [facility] ENDOSCOPY;  Service: Endoscopy;  Laterality: N/A;  . EYE SURGERY     bilateral cataract surgery   . LEFT AND RIGHT HEART CATHETERIZATION WITH CORONARY ANGIOGRAM N/A 05/29/2014   Procedure: LEFT AND RIGHT HEART CATHETERIZATION WITH CORONARY ANGIOGRAM;  Surgeon: Lorretta Harp, MD;  Location: [facility] CATH LAB;  Service: Cardiovascular;  Laterality: N/A;  . LEFT HEART CATHETERIZATION WITH CORONARY ANGIOGRAM N/A 12/27/2012   Procedure: LEFT HEART CATHETERIZATION WITH CORONARY ANGIOGRAM;  Surgeon: Lorretta Harp, MD;  Location: [facility] CATH LAB;  Service: Cardiovascular;  Laterality: N/A;    Social History   Tobacco Use  . Smoking status: Former Smoker    Packs/day: 1.00    Years: 58.00    Pack years: 58.00    Types: E-cigarettes    Start date: 12/26/1952    Last attempt to quit: 12/05/2013    Years since quitting: 3.3  . Smokeless tobacco: Never Used  . Tobacco comment: uses Vape cigarettes  Substance Use Topics  . Alcohol use: No    Alcohol/week: 0.0 oz    Family History  Problem Relation Age of Onset  . Stroke Mother   . Hypertension Mother   . Coronary artery disease Father   . Stroke  Brother   . Heart disease Brother   . Other Brother        H1N1 VIRUS  . Healthy Sister   . Healthy Sister     Review of Systems  Constitutional: Negative for chills and fever.  Respiratory: Negative for cough and shortness of breath.   Cardiovascular: Negative for chest pain, palpitations and leg swelling.  Gastrointestinal: Negative for abdominal pain, nausea and vomiting.  Musculoskeletal: Positive for back pain, falls and joint pain.  Endo/Heme/Allergies: Positive for polydipsia.  Psychiatric/Behavioral: Positive for  depression. The patient is nervous/anxious.     OBJECTIVE:  Blood pressure 100/64, pulse 88, temperature 98.5 F (36.9 C), temperature source Oral, height 5\' 6"  (1.676 m), weight 176 lb (79.8 kg), SpO2 96 %.  Physical Exam  Constitutional: She is oriented to person, place, and time and well-developed, well-nourished, and in no distress.  HENT:  Head: Normocephalic and atraumatic.  Mouth/Throat: Oropharynx is clear and moist. No oropharyngeal exudate.  Eyes: EOM are normal. Pupils are equal, round, and reactive to light. No scleral icterus.  Neck: Neck supple.  Cardiovascular: Normal rate, regular rhythm and normal heart sounds. Exam reveals no gallop and no friction rub.  No murmur heard. Pulmonary/Chest: Effort normal and breath sounds normal. She has no wheezes. She has no rales.  Musculoskeletal: She exhibits no edema.  Neurological: She is alert and oriented to person, place, and time. Gait normal.  Skin: Skin is warm and dry.  Right great toe with erythema and warmth, does not spread past MTP joint, callous at base with a small shallow ulcer. FROM, neurovascularly intact.     Dg Foot 2 Views Right  Result Date: 04/21/2017 CLINICAL DATA:  Right foot pain. Nonhealing ulcer of the right foot. EXAM: RIGHT FOOT - 2 VIEW COMPARISON:  None. FINDINGS: There is no fracture, dislocation, or radiographic evidence of osteomyelitis. There is slight arthritis in the  midfoot and at the first MTP joint and at the DIP joint of the second and third toes. Plantar and posterior calcaneal enthesophytes. IMPRESSION: No acute abnormality.  Specifically, no evidence of osteomyelitis. Electronically Signed   By: Lorriane Shire M.D.   On: 04/21/2017 13:59     ASSESSMENT and PLAN  1. Ulcer of right foot, unspecified ulcer stage ([place]) - DG Foot 2 Views Right; Future - CBC with Differential/Platelet - Ambulatory referral to Podiatry  2. Cellulitis of toe of right foot Xray negative for osteomyelitis, treating with keflex renally dosed. Referring to podiatry given callus with small ulcer and DM. Discussed elevation. Will do trial of volatren gel for pain control given on alprazolam and percocet.   3. Type 2 diabetes mellitus with stage 3 chronic kidney disease, with long-term current use of insulin (HCC) - DG Foot 2 Views Right; Future - Comprehensive metabolic panel - Hemoglobin A1c - Ambulatory referral to Podiatry  4. Left-sided low back pain without sciatica, unspecified chronicity Patient to follow-up/ discuss with Dr Ellene Route  5. Hyperlipidemia, unspecified hyperlipidemia type - Lipid panel  6. Hypothyroidism, unspecified type - TSH  Other orders - cephALEXin (KEFLEX) 500 MG capsule; Take 1 capsule (500 mg total) by mouth 2 (two) times daily. - diclofenac sodium (VOLTAREN) 1 % GEL; Apply 2 g topically 4 (four) times daily.  Return in about 1 week (around 04/28/2017).    Rutherford Guys, MD Primary [facility] at [facility] [place], [place] [address] Ph.  [phone] Fax [phone]

## 2017-04-21 NOTE — Patient Instructions (Signed)
     IF you received an x-ray today, you will receive an invoice from [facility]. Please contact [facility] at [phone] with questions or concerns regarding your invoice.   IF you received labwork today, you will receive an invoice from [facility]. Please contact [facility] at [phone] with questions or concerns regarding your invoice.   Our billing staff will not be able to assist you with questions regarding bills from these companies.  You will be contacted with the lab results as soon as they are available. The fastest way to get your results is to activate your My Chart account. Instructions are located on the last page of this paperwork. If you have not heard from us regarding the results in 2 weeks, please contact this office.     

## 2017-04-22 LAB — COMPREHENSIVE METABOLIC PANEL
ALT: 13 IU/L (ref 0–32)
AST: 18 IU/L (ref 0–40)
Albumin/Globulin Ratio: 1.6 (ref 1.2–2.2)
Albumin: 4.1 g/dL (ref 3.5–4.8)
Alkaline Phosphatase: 87 IU/L (ref 39–117)
BUN/Creatinine Ratio: 12 (ref 12–28)
BUN: 15 mg/dL (ref 8–27)
Bilirubin Total: 0.6 mg/dL (ref 0.0–1.2)
CO2: 27 mmol/L (ref 20–29)
Calcium: 9.7 mg/dL (ref 8.7–10.3)
Chloride: 96 mmol/L (ref 96–106)
Creatinine, Ser: 1.28 mg/dL — ABNORMAL HIGH (ref 0.57–1.00)
GFR calc Af Amer: 47 mL/min/{1.73_m2} — ABNORMAL LOW (ref >59)
GFR calc non Af Amer: 41 mL/min/{1.73_m2} — ABNORMAL LOW (ref >59)
Globulin, Total: 2.6 g/dL (ref 1.5–4.5)
Glucose: 184 mg/dL — ABNORMAL HIGH (ref 65–99)
Potassium: 4.6 mmol/L (ref 3.5–5.2)
Sodium: 140 mmol/L (ref 134–144)
Total Protein: 6.7 g/dL (ref 6.0–8.5)

## 2017-04-22 LAB — LIPID PANEL
Chol/HDL Ratio: 3.2 ratio (ref 0.0–4.4)
Cholesterol, Total: 148 mg/dL (ref 100–199)
HDL: 46 mg/dL (ref >39)
LDL Calculated: 79 mg/dL (ref 0–99)
Triglycerides: 114 mg/dL (ref 0–149)
VLDL Cholesterol Cal: 23 mg/dL (ref 5–40)

## 2017-04-22 LAB — HEMOGLOBIN A1C
Est. average glucose Bld gHb Est-mCnc: 249 mg/dL
Hgb A1c MFr Bld: 10.3 % — ABNORMAL HIGH (ref 4.8–5.6)

## 2017-04-22 LAB — CBC WITH DIFFERENTIAL/PLATELET
Basophils Absolute: 0 10*3/uL (ref 0.0–0.2)
Basos: 0 %
EOS (ABSOLUTE): 0.2 10*3/uL (ref 0.0–0.4)
Eos: 3 %
Hematocrit: 39.5 % (ref 34.0–46.6)
Hemoglobin: 13.1 g/dL (ref 11.1–15.9)
Immature Grans (Abs): 0 10*3/uL (ref 0.0–0.1)
Immature Granulocytes: 0 %
Lymphocytes Absolute: 1.5 10*3/uL (ref 0.7–3.1)
Lymphs: 25 %
MCH: 28.8 pg (ref 26.6–33.0)
MCHC: 33.2 g/dL (ref 31.5–35.7)
MCV: 87 fL (ref 79–97)
Monocytes Absolute: 0.4 10*3/uL (ref 0.1–0.9)
Monocytes: 6 %
Neutrophils Absolute: 4.1 10*3/uL (ref 1.4–7.0)
Neutrophils: 66 %
Platelets: 255 10*3/uL (ref 150–379)
RBC: 4.55 x10E6/uL (ref 3.77–5.28)
RDW: 16.1 % — ABNORMAL HIGH (ref 12.3–15.4)
WBC: 6.2 10*3/uL (ref 3.4–10.8)

## 2017-04-22 LAB — TSH: TSH: 8.44 u[IU]/mL — ABNORMAL HIGH (ref 0.450–4.500)

## 2017-04-25 LAB — CUP PACEART REMOTE DEVICE CHECK
Battery Remaining Longevity: 54 mo
Battery Remaining Percentage: 58 %
Battery Voltage: 2.93 V
Brady Statistic AP VP Percent: 7 %
Brady Statistic AP VS Percent: 1 %
Brady Statistic AS VP Percent: 92 %
Brady Statistic AS VS Percent: 1 %
Brady Statistic RA Percent Paced: 6.9 %
Date Time Interrogation Session: 20190210040508
HighPow Impedance: 84 Ohm
HighPow Impedance: 84 Ohm
Implantable Lead Implant Date: 20160406
Implantable Lead Implant Date: 20160406
Implantable Lead Implant Date: 20160406
Implantable Lead Location: 753858
Implantable Lead Location: 753859
Implantable Lead Location: 753860
Implantable Lead Model: 7122
Implantable Pulse Generator Implant Date: 20160406
Lead Channel Impedance Value: 1275 Ohm
Lead Channel Impedance Value: 400 Ohm
Lead Channel Impedance Value: 710 Ohm
Lead Channel Pacing Threshold Amplitude: 0.875 V
Lead Channel Pacing Threshold Amplitude: 1 V
Lead Channel Pacing Threshold Amplitude: 1 V
Lead Channel Pacing Threshold Pulse Width: 0.5 ms
Lead Channel Pacing Threshold Pulse Width: 0.5 ms
Lead Channel Pacing Threshold Pulse Width: 0.5 ms
Lead Channel Sensing Intrinsic Amplitude: 12 mV
Lead Channel Sensing Intrinsic Amplitude: 5 mV
Lead Channel Setting Pacing Amplitude: 2 V
Lead Channel Setting Pacing Amplitude: 2 V
Lead Channel Setting Pacing Amplitude: 2 V
Lead Channel Setting Pacing Pulse Width: 0.5 ms
Lead Channel Setting Pacing Pulse Width: 0.5 ms
Lead Channel Setting Sensing Sensitivity: 0.5 mV
Pulse Gen Serial Number: 7199559

## 2017-04-26 ENCOUNTER — Ambulatory Visit: Payer: Self-pay | Admitting: *Deleted

## 2017-04-26 NOTE — Telephone Encounter (Signed)
Result note read to patient; verbalizes understanding.  Will keep appt with Dr. Pamella Pert Friday.  Result note was not routed to [facility].

## 2017-04-28 ENCOUNTER — Other Ambulatory Visit: Payer: Self-pay | Admitting: Internal Medicine

## 2017-04-28 ENCOUNTER — Ambulatory Visit: Payer: Self-pay | Admitting: Family Medicine

## 2017-04-28 ENCOUNTER — Ambulatory Visit: Payer: Medicare HMO | Admitting: Family Medicine

## 2017-04-28 ENCOUNTER — Ambulatory Visit: Payer: Self-pay | Admitting: *Deleted

## 2017-04-28 NOTE — Telephone Encounter (Signed)
Pt reports BS this am is 68; states "very weak, shaky, hot." Began feeling weaker and hot flashes 2 days ago. Pt tearful during call, very anxious. States has appt at 1100 with Dr. Pamella Pert but too weak to go"And I have to take care of my husband."  Denies any dizziness, no SOB. Reports CBGs have been 200-300 range and "I have been starving myself since I found out 2 days ago my A1C was 10.3."  Pt ate pudding after checking CBG this AM. Had pt check during call, 98.  Pt very tearful and anxious, stating "Son died in November 30, 2022 and my husband has dementia."  Adult grandson is with pt now.Reassured patient, affect calmer. Instructed pt to eat, drink items per protocol; will give call back in 40 minutes at 1000 and have pt recheck her BS. Call back at 1000:   Pt states CBG 118. Reports "feels better." Pt with much calmer affect. States "just feel weak." Appt. rescheduled for 1740 per pts request; grandson will drive.  Instructed to call back if symptoms worsen, instructed to eat balanced diet as DM appropriate. Reason for Disposition . [1] Blood glucose < 70 mg/dl (3.9 mmol/l) or symptomatic AND [2] cause known  Answer Assessment - Initial Assessment Questions 1. SYMPTOMS: "What symptoms are you concerned about?"   Weakness, BS 68, feel hot, anxious 2. ONSET:  "When did the symptoms start?"     2 days ago "hot flashes, little weak." 3. BLOOD GLUCOSE: "What is your blood glucose level?"      68; During call, 98 4. USUAL RANGE: "What is your blood glucose level usually?" (e.g., usual fasting morning value, usual evening value)     200-300 lately; 158 on the 20th- 107 last night" I've been starving myself since I found out my A1C was 10.3 5. TYPE 1 or 2:  "Do you know what type of diabetes you have?"  (e.g., Type 1, Type 2, Gestational; doesn't know)      Type 2  6. INSULIN: "Do you take insulin?"      Yes, Levemir 26u HS 7. DIABETES PILLS: "Do you take any pills for your diabetes?"      8. OTHER SYMPTOMS:  "Do you have any symptoms?" (e.g., fever, frequent urination, difficulty breathing, vomiting)     "Hot, weakness, shaky" 9. LOW BLOOD GLUCOSE TREATMENT: "What have you done so far to treat the low blood glucose level?"     Ate pudding 10. ALONE: "Are you alone right now or is someone with you?"        Grandson and husband (has dementia)  Protocols used: DIABETES - LOW BLOOD SUGAR-A-AH

## 2017-04-29 ENCOUNTER — Encounter: Payer: Self-pay | Admitting: Physician Assistant

## 2017-04-29 ENCOUNTER — Ambulatory Visit (INDEPENDENT_AMBULATORY_CARE_PROVIDER_SITE_OTHER): Payer: Medicare HMO

## 2017-04-29 ENCOUNTER — Ambulatory Visit (INDEPENDENT_AMBULATORY_CARE_PROVIDER_SITE_OTHER): Payer: Medicare HMO | Admitting: Physician Assistant

## 2017-04-29 ENCOUNTER — Other Ambulatory Visit: Payer: Self-pay | Admitting: Family Medicine

## 2017-04-29 VITALS — BP 112/68 | HR 88 | Temp 98.4°F | Ht 65.0 in | Wt 165.0 lb

## 2017-04-29 DIAGNOSIS — R69 Illness, unspecified: Secondary | ICD-10-CM | POA: Diagnosis not present

## 2017-04-29 DIAGNOSIS — E039 Hypothyroidism, unspecified: Secondary | ICD-10-CM | POA: Diagnosis not present

## 2017-04-29 DIAGNOSIS — F4323 Adjustment disorder with mixed anxiety and depressed mood: Secondary | ICD-10-CM | POA: Diagnosis not present

## 2017-04-29 DIAGNOSIS — Z720 Tobacco use: Secondary | ICD-10-CM

## 2017-04-29 DIAGNOSIS — M4726 Other spondylosis with radiculopathy, lumbar region: Secondary | ICD-10-CM

## 2017-04-29 DIAGNOSIS — Z794 Long term (current) use of insulin: Secondary | ICD-10-CM

## 2017-04-29 DIAGNOSIS — L97519 Non-pressure chronic ulcer of other part of right foot with unspecified severity: Secondary | ICD-10-CM | POA: Diagnosis not present

## 2017-04-29 DIAGNOSIS — M545 Low back pain: Secondary | ICD-10-CM | POA: Diagnosis not present

## 2017-04-29 DIAGNOSIS — N183 Chronic kidney disease, stage 3 (moderate): Secondary | ICD-10-CM

## 2017-04-29 DIAGNOSIS — M5442 Lumbago with sciatica, left side: Secondary | ICD-10-CM

## 2017-04-29 DIAGNOSIS — E1122 Type 2 diabetes mellitus with diabetic chronic kidney disease: Secondary | ICD-10-CM

## 2017-04-29 DIAGNOSIS — M47816 Spondylosis without myelopathy or radiculopathy, lumbar region: Secondary | ICD-10-CM | POA: Insufficient documentation

## 2017-04-29 DIAGNOSIS — E1142 Type 2 diabetes mellitus with diabetic polyneuropathy: Secondary | ICD-10-CM

## 2017-04-29 MED ORDER — TRAMADOL HCL 50 MG PO TABS: 50.0000 mg | ORAL_TABLET | Freq: Three times a day (TID) | ORAL | 0 refills | Status: DC | PRN

## 2017-04-29 MED ORDER — BUSPIRONE HCL 15 MG PO TABS: 15.0000 mg | ORAL_TABLET | Freq: Two times a day (BID) | ORAL | 3 refills | Status: DC

## 2017-04-29 NOTE — Patient Instructions (Addendum)
1. Keep using Voltaren gel for back pain.  2. Call Dr. Melford Aase and tell him that you need some help taking care of your husband. Ask for a home health referral to get an aide at home so you can have help with the heavy lifting and so you can get some sleep at night.   3. Try to make better eating choices, continue the Levemir at 20 units each day. Remember that if you don't take good care of yourself, you can't take care of your husband.  4. Buspirone start: Buspirone: The tablets are 15 mg each and are scored to be broken in half (7.5 mg) or in thirds (5 mg). Start by taking 5 mg (1/3 tablet) twice each day x 1 week. Then 7.5 mg (1/2 tablet) twice each day x 1 week. Then 10 mg (2/3 tablet) twice each day x 1 week. Then 12.5 mg (1/2 tablet + 1/3 tablet) twice each day x 1 week. Then 15 mg twice (1 whole tablet) each day thereafter.   IF you received an x-ray today, you will receive an invoice from [facility]. Please contact [facility] at [phone] with questions or concerns regarding your invoice.   IF you received labwork today, you will receive an invoice from [facility]. Please contact [facility] at [phone] with questions or concerns regarding your invoice.   Our billing staff will not be able to assist you with questions regarding bills from these companies.  You will be contacted with the lab results as soon as they are available. The fastest way to get your results is to activate your My Chart account. Instructions are located on the last page of this paperwork. If you have not heard from [place] regarding the results in 2 weeks, please contact this office.    Did you know that you begin to benefit from quitting smoking within the first twenty minutes? It's TRUE.  At 20 minutes: -blood pressure decreases -pulse rate drops -body temperature of hands and feet increases  At 8 hours: -carbon monoxide level in blood drops to normal -oxygen level in blood  increases to normal  At 24 hours: -the chance of heart attack decreases  At 48 hours: -nerve endings start regrowing -ability to smell and taste is enhanced  2 weeks-3 months: -circulation improves -walking becomes easier -lung function improves  1-9 months: -coughing, sinus congestion, fatigue and shortness of breath decreases  1 year: -excess risk of heart disease is decreased to HALF that of a smoker  5 years: Stroke risk is reduced to that of people who have never smoked  10 years: -risk of lung cancer drops to as little as half that of continuing smokers -risk of cancer of the mouth, throat, esophagus, bladder, kidney and pancreas decreases -risk of ulcer decreases  15 years -risk of heart disease is now similar to that of people who have never smoked -risk of death returns to nearly the level of people who have never smoked

## 2017-04-29 NOTE — Progress Notes (Signed)
Subjective:    Patient ID: [name], female    DOB: 02/16/1941, 77 y.o.   MRN: [id number] Chief Complaint  Patient presents with  . Wound Check    R/Great toe  . ck sugar  . Back Pain    x 3-4 weeks,  has gel to use.  no improvement    Back Pain  This is a chronic problem. Episode onset: for the past two weeks. The problem occurs constantly. The problem has been waxing and waning since onset. Pain location: lumabr spine and left hip. The quality of the pain is described as aching. The pain is at a severity of 8/10. The pain is severe. The pain is worse during the day. Exacerbated by: lifting her husband.  Associated symptoms include chest pain (Once in a while. Needs to see Dr. Caryl Comes.). Treatments tried: Voltaren gel. The treatment provided moderate relief.   Patient initially came in for wound check for a laceration on her right great toe on 02/15/2017. At this time she was prescribed oral Doxycycline. At her visit on 2/15, Dr. Pamella Pert prescribed Keflex. She is coming in to get the wound re-evaluated today.   Patient has been having elevated anxiety due to stress at home. Her son completed suicide of September of this year. Within the last few weeks, her husband was diagnosed with Vascular Dementia. She is her husband's primary caregiver. She has not been able to sleep normally for fear that she will fall asleep and miss her husband calling out that he needs her.   Patient Active Problem List   Diagnosis Date Noted  . Obesity 02/15/2017  . History of laryngeal cancer 06/09/2016  . S/P coronary artery stent placement 05/30/2016  . Tachycardia 05/26/2015  . Syncope 05/26/2015  . ICD (implantable cardioverter-defibrillator), biventricular, in situ 05/26/2015  . Chronic kidney disease, stage III (moderate) ([place]) 04/10/2015  . Esophagitis, reflux 04/10/2015  . Vitamin D deficiency 04/10/2015  . Cough 03/10/2015  . Chronic pain   . Chronic systolic CHF (congestive heart failure)  ([place])   . Coronary artery disease involving native coronary artery   . Left ventricular dysfunction   . Dyspnea on exertion   . Chest pain 05/01/2013  . Hyperlipidemia 12/26/2012  . Tobacco abuse 12/26/2012  . Block, bundle branch, left 12/26/2012  . Type 2 diabetes mellitus with stage 3 chronic kidney disease, with long-term current use of insulin ([place]) 12/26/2012  . Essential hypertension 10/03/2008  . SLEEP APNEA 10/03/2008  . Chronic obstructive pulmonary disease (HCC) 10/03/2008   Allergies  Allergen Reactions  . Valium [Diazepam] Swelling    face   Prior to Admission medications   Medication Sig Start Date End Date Taking? Authorizing Provider  ACCU-CHEK AVIVA PLUS test strip 1 each by Other route as needed (glucose).  05/15/15  Yes [provider]  ALPRAZolam Duanne Moron) 1 MG tablet Take 1 mg by mouth 3 (three) times daily as needed for anxiety or sleep.    Yes [provider]  aspirin EC 81 MG tablet Take 81 mg by mouth at bedtime.   Yes [provider]  carvedilol (COREG) 12.5 MG tablet TAKE 1 AND 1/2 TABLETS TWICE DAILY WITH A MEAL  (DOSE  INCREASE) 02/20/17  Yes Kathlen Mody, Scott T, PA-C  cephALEXin (KEFLEX) 500 MG capsule Take 1 capsule (500 mg total) by mouth 2 (two) times daily. 04/21/17  Yes Rutherford Guys, MD  citalopram (CELEXA) 40 MG tablet Take 1 tablet by mouth daily. 10/18/16  Yes [provider]  clopidogrel (PLAVIX) 75 MG tablet TAKE 1 TABLET EVERY DAY 10/24/16  Yes Croitoru, Mihai, MD  diclofenac sodium (VOLTAREN) 1 % GEL Apply 2 g topically 4 (four) times daily. 04/21/17  Yes Rutherford Guys, MD  furosemide (LASIX) 40 MG tablet TAKE 2 TABLETS EVERY MORNING  AND TAKE 1 TABLET EVERY EVENING 01/16/17  Yes Lorretta Harp, MD  Insulin Detemir (LEVEMIR) 100 UNIT/ML Pen Inject 26 Units into the skin daily at 10 pm.   Yes [provider]  levothyroxine (SYNTHROID, LEVOTHROID) 112 MCG tablet Take 112 mcg by mouth at bedtime.    Yes  [provider]  MELATONIN PO Take 2 tablets by mouth at bedtime.   Yes [provider]  nitroGLYCERIN (NITROSTAT) 0.4 MG SL tablet Place 1 tablet (0.4 mg total) under the tongue every 5 (five) minutes as needed for chest pain. 05/01/13  Yes Brett Canales, PA-C  oxyCODONE-acetaminophen (PERCOCET/ROXICET) 5-325 MG tablet Take 1 tablet by mouth every 6 (six) hours as needed for severe pain.    Yes [provider]  pantoprazole (PROTONIX) 40 MG tablet Take 40 mg by mouth 2 (two) times daily.  04/30/13  Yes [provider]  potassium chloride SA (K-DUR,KLOR-CON) 20 MEQ tablet TAKE 1 TABLET EVERY DAY  (DOSE  INCREASE) 06/03/16  Yes Lorretta Harp, MD  simvastatin (ZOCOR) 20 MG tablet Take 20 mg by mouth daily with supper.    Yes [provider]    Past Medical History:  Diagnosis Date  . Anxiety   . Arthritis   . Chronic pain    a. Prior h/o chronic pain on methadone.  . Chronic systolic CHF (congestive heart failure) (HCC)    a. mixed ischemic/non-ischemic cardiomyopathy. b. s/p CRT-D in 06/2014.  Marland Kitchen CKD (chronic kidney disease), stage III ([place])   . Complication of anesthesia    hard time waking her up from general surgery  . Coronary artery disease    a. remote RCA stenting in 2008 with non-DES. b. Cath 06/2014 following abnormal nuc: Stable, unchanged from prior cath, patent stent and 40% LM.  . Diabetes mellitus ([place])   . GERD (gastroesophageal reflux disease)   . Headache   . Hyperlipidemia   . Hypertension   . LBBB (left bundle branch block)   . Nonischemic cardiomyopathy ([place])   . OSA (obstructive sleep apnea)    AHI-9.77/hr, during REM-50.32/hr  . Tobacco abuse    Social History   Socioeconomic History  . Marital status: Married    Spouse name: Not on file  . Number of children: Not on file  . Years of education: Not on file  . Highest education level: Not on file  Social Needs  . Financial resource strain: Not on file  . Food  insecurity - worry: Not on file  . Food insecurity - inability: Not on file  . Transportation needs - medical: Not on file  . Transportation needs - non-medical: Not on file  Occupational History  . Not on file  Tobacco Use  . Smoking status: Current Every Day Smoker    Packs/day: 2.00    Years: 58.00    Pack years: 116.00    Types: Cigarettes    Start date: 12/26/1952    Last attempt to quit: 12/05/2013    Years since quitting: 3.4  . Smokeless tobacco: Never Used  . Tobacco comment: uses Vape cigarettes  Substance and Sexual Activity  . Alcohol use: No  Alcohol/week: 0.0 oz  . Drug use: No  . Sexual activity: Not on file  Other Topics Concern  . Not on file  Social History Narrative   Patient lives in [place] with his husband and grandson.   Son completed suicide on September 17th of this year, patient found him.    Review of Systems  Constitutional: Positive for fatigue. Negative for activity change.  HENT: Positive for hearing loss and trouble swallowing (For over a year. Father choked to death.). Negative for congestion and sore throat.   Respiratory: Positive for cough and shortness of breath.   Cardiovascular: Positive for chest pain (Once in a while. Needs to see Dr. Caryl Comes.). Negative for palpitations.  Musculoskeletal: Positive for back pain.  Skin: Positive for wound (On bottom great toe of right foot. Not throbbing. Sore. ).  Psychiatric/Behavioral: Positive for dysphoric mood and sleep disturbance.       Objective:   Physical Exam  Constitutional: She is oriented to person, place, and time.  BP 112/68 (BP Location: Left Arm, Patient Position: Sitting, Cuff Size: Normal)   Pulse 88   Temp 98.4 F (36.9 C) (Oral)   Ht 5\' 5"  (1.651 m)   Wt 165 lb (74.8 kg)   SpO2 97%   BMI 27.46 kg/m   Neck: Normal range of motion. Neck supple.  Cardiovascular: Normal rate and regular rhythm.  Pulmonary/Chest: She has wheezes (Scattered throughout.).  Breathing  slightly labored.  Musculoskeletal: Normal range of motion. She exhibits tenderness (On palpation of lower back.).  Neurological: She is alert and oriented to person, place, and time. She has normal reflexes.  Diabetic neuropathy severe in feet.  Skin: Skin is dry. There is erythema.  Wound on great toe of right foot. Central yellow callous, purple skin surrounding the central yellow. Non exudative. Appears to be non-infected.   Psychiatric:  Patient cried for large portion of visit.   Dg Lumbar Spine Complete  Result Date: 04/29/2017 CLINICAL DATA:  Left-sided low back pain radiating into the left buttock and hip. EXAM: LUMBAR SPINE - COMPLETE 4+ VIEW COMPARISON:  None. FINDINGS: No fracture. There has been a previous posterior lumbar spine fusion at L2 and L3. The orthopedic hardware appears well-seated and well-positioned. Mature bone graft material spans the L2-L3 disc interspace. There is a grade 1 anterolisthesis of L3 and L4. No other spondylolisthesis. Moderate loss of disc height is noted at L3-L4 and L5-S1. Mild loss of disc height at L4-L5. Mild facet degenerative change noted on the right from L3-L4 through L5-S1. Bones are diffusely demineralized. There is a mild curvature of the lumbar spine, convex to the right. There are atherosclerotic calcifications along the aorta and iliac arteries. IMPRESSION: 1. No fracture or acute finding. 2. No evidence of loosening of the posterior lumbar spine fusion hardware. 3. Degenerative changes as described. Electronically Signed   By: Lajean Manes M.D.   On: 04/29/2017 15:39      Assessment & Plan:   1. Ulcer of right foot, unspecified ulcer stage ([place]) Ulcer on right foot appears to be healing well. There is a central callous and it is non-exudative. It is not hot or red and appears to not be infected. Patient'S diabetic nephropathy is progressing on both feet. Her lack of sensation and recent ulcer prompted a referral to podiatry.  - Ambulatory  referral to Podiatry  2. Acute midline low back pain with left-sided sciatica Patient reports middle low back pain that radiates to her left hip. The pain  is somewhat alleviated by bending forward and use of topical Voltaren gel. An X-ray of her spine shows degenerative changes that are likely the source of her pain. She has been lifting her husband, who is newly diagnosed with Vascular Dementia, in and out of bed, a source of exacerbation. A referral to Orthopedics to further evaluate and manage this problem will be made.   - DG Lumbar Spine Complete; Future  3. Wheezing Patient has scattered expiratory wheezing bilaterally. She currently smokes about two packs of Cigarettes per day. She says she was able to quit for 4 years at one point but that life became so stressful she returned to smoking. She was counseled on smoking cessation at this visit.  4. Adjustment reaction with anxiety and depression Patient's son completed suicide in Sept of this year. Her husband was recently diagnosed with Vascular Dementia and she is his primary caregiver. The stress of these changes had sent her anxiety through the roof and made her unable to get enough sleep. To mitigate her anxiety and insomnia, Buspar has been added with instructions to ramp up to optimal dose included in patient instructions.  - busPIRone (BUSPAR) 15 MG tablet; Take 1 tablet (15 mg total) by mouth 2 (two) times daily.  Dispense: 60 tablet; Refill: 3  5. Type 2 diabetes mellitus with stage 3 chronic kidney disease, with long-term current use of insulin ([place]) Patient's last Hemoglobin A1c (done at her visit on 04/21/2017,) was 10.3. She is on 26 units of Levermir to manage her diabetes, but tapered back to 20 units when she thinks her sugar is getting low. Patient was instructed to use Levemir as originally instructed (26 units,) and to try adopt healthier lifestyle changes (she loves sweets, so consuming sweets in moderation was discussed.)    6. Tobacco abuse Patient currently smokes 2 packs of cigarettes per day. She was able to quit smoking for 4 years at one point, but the recent stresses of her life have caused her to return to smoking. At this visit, patient has scattered expiratory wheezes bilaterally. She was counseled on smoking cessation.   7. Elevated TSH Patient's Hypothyroidism is currently managed with Levothyroxine. She is not always compliant about taking it, resulting in a TSH of 8.44 on 04/21/2017. Patient was informed of this lab result and encouraged to be compliant with her Levothyroxine in an effort to lower her TSH level.   Return in about 2 weeks (around 05/13/2017) for re-evaluation of diabetes, mood with Dr. Pamella Pert.

## 2017-04-29 NOTE — Progress Notes (Signed)
Patient ID: [name], female    DOB: [birth year], 77 y.o.   MRN: [id number]  PCP: Rutherford Guys, MD  Chief Complaint  Patient presents with  . Wound Check    R/Great toe  . ck sugar  . Back Pain    x 3-4 weeks,  has gel to use.  no improvement    Subjective:   Presents for evaluation of a wound on the RIGHT great toe. I first saw her for this on 02/15/2017, at which time she appeared to have a cellulitis from a wound, suspected a laceration from a shard of broken glass. She was prescribed doxycycline, warm compresses and re-evaluation in 2 days.  Her next visit was with Dr. Pamella Pert on 04/21/2017, at which time she also established for primary care. Labs were updated. She was experiencing low back and hip pain. Radiograph of the RIGHT foot was negative for FB or osteomyelitis. Cephalexin and voltaren gel started 04/21/2017, and she was referred to podiatry. She was to follow-up with Dr. Pamella Pert yesterday, but needed to reschedule for today. Labs revealed a mildly elevated TSH and uncontrolled A1C (10.3). Renal function, while reduced, was stable.  Earlier today she sent a request to refill cephalexin.  Her son died by suicide in 12-17-2016.  Her husband was diagnosed with vascular dementia several weeks after their son's death. 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 Hasn't slept this week, worried that she won't hear her husband if he needs her. She has a sister nearby, but no one else that she can ask to help her.  Took a dose of trazodone last night, from a previous prescription, but it didn't help. Citalopram isn't helping her mood. She didn't realize this medication was for her mood, and asked for something to help.  Chart reviewed, including Care Everywhere. Insulin was added to glipizide in 09/2015. A1C 08/2016 was 6.5%. She continued to report glucose 200-300's. Her anxiety was "terrible" at a  visit in 10/2016. Trazodone was ineffective. She wasn't able to follow-up with her psychiatrist, Dr. Toy Care, who had been prescribing alprazolam. She had tried escitalopram in the past, and found it ineffective. Dr. Ellene Route had performed an [facility] which helped her back pain. Duloxetine and buspirone were prescribed. Today she relates that they didn't help, but didn't take them very long (less than 4 weeks at the initial doses).  She doesn't recall why metformin was stopped. Presumably due to her renal function. She reports that when she was started on Levemir, she stopped the glipizide. Glucose 68-267. 110 this morning. Reduced Levemir from 26 to 20 units yesterday, due to reading of 68 yesterday.   Review of Systems Constitutional: Positive for fatigue. Negative for activity change.  HENT: Positive for hearing loss and trouble swallowing (For over a year. Father choked to death.). Negative for congestion and sore throat.   Respiratory: Positive for cough and shortness of breath.   Cardiovascular: Positive for chest pain (Once in a while. Needs to see Dr. Caryl Comes.). Negative for palpitations.  Musculoskeletal: Positive for back pain.  Skin: Positive for wound (On bottom great toe of right foot. Not throbbing. Sore. ).  Psychiatric/Behavioral: Positive for dysphoric mood and sleep disturbance.   Depression screen [facility] 2/9 04/29/2017 04/21/2017 02/15/2017  Decreased Interest 3 0 0  Down, Depressed, Hopeless 3 0 0  PHQ - 2 Score 6 0 0  Altered sleeping 3 - -  Tired, decreased energy 3 - -  Change in appetite 3 - -  Feeling bad or failure about yourself  3 - -  Trouble concentrating 3 - -  Moving slowly or fidgety/restless 0 - -  Suicidal thoughts 0 - -  PHQ-9 Score 21 - -       Patient Active Problem List   Diagnosis Date Noted  . Obesity 02/15/2017  . History of laryngeal cancer 06/09/2016  . S/P coronary artery stent placement 05/30/2016  . Tachycardia 05/26/2015  . Syncope 05/26/2015  .  ICD (implantable cardioverter-defibrillator), biventricular, in situ 05/26/2015  . Chronic kidney disease, stage III (moderate) ([place]) 04/10/2015  . Esophagitis, reflux 04/10/2015  . Vitamin D deficiency 04/10/2015  . Cough 03/10/2015  . Chronic pain   . Chronic systolic CHF (congestive heart failure) ([place])   . Coronary artery disease involving native coronary artery   . Left ventricular dysfunction   . Dyspnea on exertion   . Chest pain 05/01/2013  . Hyperlipidemia 12/26/2012  . Tobacco abuse 12/26/2012  . Block, bundle branch, left 12/26/2012  . Type 2 diabetes mellitus with stage 3 chronic kidney disease, with long-term current use of insulin ([place]) 12/26/2012  . Essential hypertension 10/03/2008  . SLEEP APNEA 10/03/2008  . Chronic obstructive pulmonary disease ([place]) 10/03/2008     Prior to Admission medications   Medication Sig Start Date End Date Taking? Authorizing Provider  ACCU-CHEK AVIVA PLUS test strip 1 each by Other route as needed (glucose).  05/15/15  Yes [provider]  ALPRAZolam Duanne Moron) 1 MG tablet Take 1 mg by mouth 3 (three) times daily as needed for anxiety or sleep.    Yes [provider]  aspirin EC 81 MG tablet Take 81 mg by mouth at bedtime.   Yes [provider]  carvedilol (COREG) 12.5 MG tablet TAKE 1 AND 1/2 TABLETS TWICE DAILY WITH A MEAL  (DOSE  INCREASE) 02/20/17  Yes Kathlen Mody, Scott T, PA-C  cephALEXin (KEFLEX) 500 MG capsule Take 1 capsule (500 mg total) by mouth 2 (two) times daily. 04/21/17  Yes Rutherford Guys, MD  citalopram (CELEXA) 40 MG tablet Take 1 tablet by mouth daily. 10/18/16  Yes [provider]  clopidogrel (PLAVIX) 75 MG tablet TAKE 1 TABLET EVERY DAY 10/24/16  Yes Croitoru, Mihai, MD  diclofenac sodium (VOLTAREN) 1 % GEL Apply 2 g topically 4 (four) times daily. 04/21/17  Yes Rutherford Guys, MD  furosemide (LASIX) 40 MG tablet TAKE 2 TABLETS EVERY MORNING  AND TAKE 1 TABLET EVERY EVENING 01/16/17  Yes  Lorretta Harp, MD  Insulin Detemir (LEVEMIR) 100 UNIT/ML Pen Inject 26 Units into the skin daily at 10 pm.   Yes [provider]  levothyroxine (SYNTHROID, LEVOTHROID) 112 MCG tablet Take 112 mcg by mouth at bedtime.    Yes [provider]  MELATONIN PO Take 2 tablets by mouth at bedtime.   Yes [provider]  nitroGLYCERIN (NITROSTAT) 0.4 MG SL tablet Place 1 tablet (0.4 mg total) under the tongue every 5 (five) minutes as needed for chest pain. 05/01/13  Yes Brett Canales, PA-C  oxyCODONE-acetaminophen (PERCOCET/ROXICET) 5-325 MG tablet Take 1 tablet by mouth every 6 (six) hours as needed for severe pain.    Yes [provider]  pantoprazole (PROTONIX) 40 MG tablet Take 40 mg by mouth 2 (two) times daily.  04/30/13  Yes [provider]  potassium chloride SA (K-DUR,KLOR-CON) 20 MEQ tablet TAKE 1 TABLET EVERY DAY  (  DOSE  INCREASE) 06/03/16  Yes Lorretta Harp, MD  simvastatin (ZOCOR) 20 MG tablet Take 20 mg by mouth daily with supper.    Yes [provider]     Allergies  Allergen Reactions  . Valium [Diazepam] Swelling    face       Objective:  Physical Exam  Constitutional: She is oriented to person, place, and time. She appears well-developed and well-nourished. She is active and cooperative. No distress.  BP 112/68 (BP Location: Left Arm, Patient Position: Sitting, Cuff Size: Normal)   Pulse 88   Temp 98.4 F (36.9 C) (Oral)   Ht 5\' 5"  (1.651 m)   Wt 165 lb (74.8 kg)   SpO2 97%   BMI 27.46 kg/m   HENT:  Head: Normocephalic and atraumatic.  Right Ear: Hearing normal.  Left Ear: Hearing normal.  Eyes: Conjunctivae are normal. No scleral icterus.  Neck: Normal range of motion. Neck supple. No thyromegaly present.  Cardiovascular: Normal rate, regular rhythm and normal heart sounds.  Pulses:      Radial pulses are 2+ on the right side, and 2+ on the left side.  Pulmonary/Chest: Effort normal and breath sounds normal.   Lymphadenopathy:       Head (right side): No tonsillar, no preauricular, no posterior auricular and no occipital adenopathy present.       Head (left side): No tonsillar, no preauricular, no posterior auricular and no occipital adenopathy present.    She has no cervical adenopathy.       Right: No supraclavicular adenopathy present.       Left: No supraclavicular adenopathy present.  Neurological: She is alert and oriented to person, place, and time. No sensory deficit.  Skin: Skin is warm, dry and intact. Lesion (callous with old blood on the base of the RIGHT great toe, site of previous injury and cellulitis. No erythema, edema, induration) noted. No rash noted. No cyanosis or erythema. Nails show no clubbing.  Psychiatric: Her speech is normal and behavior is normal. Judgment and thought content normal. Her mood appears anxious. Her affect is labile. She exhibits a depressed mood.     Dg Lumbar Spine Complete  Result Date: 04/29/2017 CLINICAL DATA:  Left-sided low back pain radiating into the left buttock and hip. EXAM: LUMBAR SPINE - COMPLETE 4+ VIEW COMPARISON:  None. FINDINGS: No fracture. There has been a previous posterior lumbar spine fusion at L2 and L3. The orthopedic hardware appears well-seated and well-positioned. Mature bone graft material spans the L2-L3 disc interspace. There is a grade 1 anterolisthesis of L3 and L4. No other spondylolisthesis. Moderate loss of disc height is noted at L3-L4 and L5-S1. Mild loss of disc height at L4-L5. Mild facet degenerative change noted on the right from L3-L4 through L5-S1. Bones are diffusely demineralized. There is a mild curvature of the lumbar spine, convex to the right. There are atherosclerotic calcifications along the aorta and iliac arteries. IMPRESSION: 1. No fracture or acute finding. 2. No evidence of loosening of the posterior lumbar spine fusion hardware. 3. Degenerative changes as described. Electronically Signed   By: Lajean Manes  M.D.   On: 04/29/2017 15:39       Assessment & Plan:   Problem List Items Addressed This Visit    Tobacco abuse    Increased smoking following her son's death and husband's diagnosis of vascular dementia in fall 2018. Encouraged smoking cessation.      Type 2 diabetes mellitus with stage 3 chronic kidney disease,  with long-term current use of insulin (HCC)    Uncontrolled. Glucose ranging from 68-200's. Counseled on the importance of healthier eating choices, and the importance of taking medications as prescribed. Elect against resumption of glipizide due to erratic and unhealthy eating, and risk of hypoglycemia. COntinue Levemir 20-26 units/day for now and re-evaluate in 2 weeks.      Hypothyroidism    Inconsistent levothyroxine doses prior to labs 04/21/2017 likely explains the mild elevation. Has been taking it daily since she was notified of the results. No change in dose made today. Lab not rechecked today. Anticipate normalization, and would consider recheck in 6-12 weeks.      DJD (degenerative joint disease), lumbar    Previous surgery and then ESI with Dr. Ellene Route. Recommend re-evaluation with Dr. Ellene Route and tramadol PRN until then.      Relevant Medications   traMADol (ULTRAM) 50 MG tablet   Adjustment reaction with anxiety and depression   Relevant Medications   busPIRone (BUSPAR) 15 MG tablet    Other Visit Diagnoses    Ulcer of right foot, unspecified ulcer stage ([place])    -  Primary   Ulceration is healed, callous remains. High risk for recurrent ulceration. Proceed with referral to podiatry.   Relevant Orders   Ambulatory referral to Podiatry   Acute midline low back pain with left-sided sciatica       Oxycodone Rx appears old and expired. Trial of tramadol. Follow-up with Dr. Ellene Route due to spondylolisthesis.   Relevant Medications   traMADol (ULTRAM) 50 MG tablet   Other Relevant Orders   DG Lumbar Spine Complete ([place])       Return in about 2 weeks  (around 05/13/2017) for re-evaluation of diabetes, mood with Dr. Pamella Pert.   Fara Chute, PA-C [facility] [place]

## 2017-04-30 ENCOUNTER — Telehealth: Payer: Self-pay | Admitting: Physician Assistant

## 2017-04-30 ENCOUNTER — Encounter: Payer: Self-pay | Admitting: Physician Assistant

## 2017-04-30 DIAGNOSIS — E119 Type 2 diabetes mellitus without complications: Secondary | ICD-10-CM | POA: Insufficient documentation

## 2017-04-30 DIAGNOSIS — F4323 Adjustment disorder with mixed anxiety and depressed mood: Secondary | ICD-10-CM | POA: Insufficient documentation

## 2017-04-30 DIAGNOSIS — E1142 Type 2 diabetes mellitus with diabetic polyneuropathy: Secondary | ICD-10-CM | POA: Insufficient documentation

## 2017-04-30 NOTE — Assessment & Plan Note (Signed)
Uncontrolled. Glucose ranging from 68-200's. Counseled on the importance of healthier eating choices, and the importance of taking medications as prescribed. Elect against resumption of glipizide due to erratic and unhealthy eating, and risk of hypoglycemia. COntinue Levemir 20-26 units/day for now and re-evaluate in 2 weeks.

## 2017-04-30 NOTE — Assessment & Plan Note (Signed)
Increased smoking following her son's death and husband's diagnosis of vascular dementia in fall 2018. Encouraged smoking cessation.

## 2017-04-30 NOTE — Assessment & Plan Note (Signed)
Previous surgery and then ESI with Dr. Ellene Route. Recommend re-evaluation with Dr. Ellene Route and tramadol PRN until then.

## 2017-04-30 NOTE — Telephone Encounter (Signed)
Call from the pharmacy via after hours line.  Patient has a current prescription of oxycodone from Dr. Ellene Route, filled 04/17/2017, a 15-day supply.  Unable to reach patient by phone to clarify. Left message for her to return my call.  Advised pharmacy to hold tramadol prescribed by me today, until 05/02/2017. However, if patient receives new Rx from Dr. Ellene Route, cancel the tramadol altogether.

## 2017-04-30 NOTE — Assessment & Plan Note (Signed)
Inconsistent levothyroxine doses prior to labs 04/21/2017 likely explains the mild elevation. Has been taking it daily since she was notified of the results. No change in dose made today. Lab not rechecked today. Anticipate normalization, and would consider recheck in 6-12 weeks.

## 2017-05-01 ENCOUNTER — Telehealth: Payer: Self-pay

## 2017-05-01 ENCOUNTER — Ambulatory Visit (INDEPENDENT_AMBULATORY_CARE_PROVIDER_SITE_OTHER): Payer: Medicare HMO

## 2017-05-01 DIAGNOSIS — Z95810 Presence of automatic (implantable) cardiac defibrillator: Secondary | ICD-10-CM | POA: Diagnosis not present

## 2017-05-01 DIAGNOSIS — I5022 Chronic systolic (congestive) heart failure: Secondary | ICD-10-CM | POA: Diagnosis not present

## 2017-05-01 NOTE — Progress Notes (Signed)
EPIC Encounter for ICM Monitoring  Patient Name: [name] is a 77 y.o. female Date: 05/01/2017 Primary Care Physican: Rutherford Guys, MD Primary Cardiologist:Berry Electrophysiologist: Caryl Comes Dry Weigunknown Bi-V Pacing: >99%                                                       Attempted call to patient and unable to reach.  Left detailed message regarding transmission.  Transmission reviewed.    Thoracic impedance normal.  Prescribed dosage: Furosemide 40 mg 2 tablets (80 mg total) every AMand 1 tablet (40 mg total) every PM. Potassium 20 mEq 1 tablet daily.  Labs: 04/21/2017 Creatinine 1.28, BUN 15, Potassium 4.6, Sodium 140, EGFR 41-47 09/19/2016 Creatinine 1.39, BUN 36, Potassium 4.1, Sodium 125, EGFR 37-45 Care Everywhere results 09/13/2016 Creatinine 1.51, BUN 30, Potassium 4.6, Sodium 128, EGFR 34-41 Care Everywhere results 08/31/2016 Creatinine 1.40, BUN 16, Potassium 4.3, Sodium 137, EGFR 36-42 06/09/2016 Creatinine 1.51, BUN 23, Potassium 4.5, Sodium 135 Care Everywhere results 06/01/2016 Creatinine 1.07, BUN 5, Potassium 3.7, Sodium 143, EGFR 50->60 Care Everywhere results 05/31/2016 Creatinine 1.13, BUN 8, Potassium 3.4, Sodium 145, EGFR 47-57 Care Everywhere results 05/30/2016 Creatinine 1.25, BUN 12, Potassium 3.6, Sodium 137, EGFR 42-51 Care Everywhere results 05/05/2016 Creatinine 1.45, BUN 21, Potassium 4.5, Sodium 135 Care Everywhere results 05/26/2015 Creatinine 1.36, BUN 17, Potassium 4.5, Sodium 140  Recommendations: Left voice mail with ICM number and encouraged to call if experiencing any fluid symptoms.  Follow-up plan: ICM clinic phone appointment on 06/12/2017.  Office appointment scheduled 05/12/2017 with Chanetta Marshall, NP.  Copy of ICM check sent to Dr. Caryl Comes.   3 month ICM trend: 05/01/2017    1 Year ICM trend:       [name], RN 05/01/2017 2:00 PM

## 2017-05-01 NOTE — Telephone Encounter (Signed)
Remote ICM transmission received.  Attempted call to patient and left detailed message per DPR regarding transmission and next ICM scheduled for 06/12/2017.  Advised to return call for any fluid symptoms or questions.

## 2017-05-02 NOTE — Progress Notes (Signed)
Received call back from patient.  She stated she is doing fine and denied any fluid symptoms. No changes today and reviewed transmission.  She asked to have phone number changed to [phone] (cell) and [phone] (home).  She said her son and daughter are emergency contacts.  Son's number is [phone] (cell) and name [facility].

## 2017-05-03 NOTE — Progress Notes (Signed)
Left vm for cb

## 2017-05-10 ENCOUNTER — Ambulatory Visit: Payer: Self-pay

## 2017-05-10 NOTE — Telephone Encounter (Signed)
Pt. States she pulled dead skin off her right Great toe and a large amount of "white pus came out." States she has an opening on the side of the toe 1/4 inch diameter. 1/8 inch deep. Wound bed pink with "another small hole inside wound." Cleaned area with alcohol and peroxide. Applied Neosporin ointment and covered with a dry bandage. Has an appointment Saturday and states she cannot come in any sooner because she cares for her husband who has dementia and she has no help. Wants to know what Dr. Pamella Pert suggests as far as wound care until she can see her Saturday.  Reason for Disposition . [1] Pus or cloudy fluid draining from wound AND [2] no fever  Answer Assessment - Initial Assessment Questions 1. LOCATION: "Where is the wound located?"      Right Great toe 2. WOUND APPEARANCE: "What does the wound look like?"      Opening on the side 3. SIZE: If redness is present, ask: "What is the size of the red area?" (Inches, centimeters, or compare to size of a coin)      1/4 inch opening - pink inside. Can see another hole inside the wound bed 4. SPREAD: "What's changed in the last day?"  "Do you see any red streaks coming from the wound?"     No 5. ONSET: "When did it start to look infected?"      This morning peeled "some dead skin off and it opened and a large amount of white pus came." 6. MECHANISM: "How did the wound start, what was the cause?"     It's been there for several weeks 7. PAIN: "Is there any pain?" If so, ask: "How bad is the pain?"   (Scale 1-10; or mild, moderate, severe)     Better since pus came out. 8. FEVER: "Do you have a fever?" If so, ask: "What is your temperature, how was it measured, and when did it start?"     No 9. OTHER SYMPTOMS: "Do you have any other symptoms?" (e.g., shaking chills, weakness, rash elsewhere on body)     No 10. PREGNANCY: "Is there any chance you are pregnant?" "When was your last menstrual period?"       No  Protocols used: WOUND  INFECTION-A-AH

## 2017-05-11 ENCOUNTER — Ambulatory Visit: Payer: Medicare HMO | Admitting: Podiatry

## 2017-05-11 ENCOUNTER — Encounter: Payer: Self-pay | Admitting: Podiatry

## 2017-05-11 ENCOUNTER — Ambulatory Visit (INDEPENDENT_AMBULATORY_CARE_PROVIDER_SITE_OTHER): Payer: Medicare HMO

## 2017-05-11 VITALS — BP 112/65 | HR 81 | Temp 98.3°F | Resp 16

## 2017-05-11 DIAGNOSIS — E1149 Type 2 diabetes mellitus with other diabetic neurological complication: Secondary | ICD-10-CM

## 2017-05-11 DIAGNOSIS — L97519 Non-pressure chronic ulcer of other part of right foot with unspecified severity: Secondary | ICD-10-CM

## 2017-05-11 DIAGNOSIS — I739 Peripheral vascular disease, unspecified: Secondary | ICD-10-CM

## 2017-05-11 MED ORDER — CLINDAMYCIN HCL 300 MG PO CAPS: 300.0000 mg | ORAL_CAPSULE | Freq: Three times a day (TID) | ORAL | 0 refills | Status: DC

## 2017-05-11 NOTE — Telephone Encounter (Signed)
Patient seen by podiatry today

## 2017-05-11 NOTE — Progress Notes (Signed)
Subjective:    Patient ID: [name], female    DOB: 08-18-1940, 77 y.o.   MRN: [id number]  HPI  Ms. presents the office today for concerns of a wound to the bottom of her right big toe which is been ongoing for about 6 weeks.  She states this started initially when she stepped on a piece of glass.Maple she was able to get a piece of glass out of her foot she has been seen by her primary care doctor for this.  She states that she is currently not on any antibiotics.  She states that yesterday she removed some skin on the bottom of her toe and a large amount of pus came out.  She is been cleaned with alcohol and peroxide.  She has not noticed any drainage or pus today.  She denies any red streaks.  Overall she feels well and she denies any systemic complaints including fevers, chills, nausea, vomiting.  Denies any calf pain, chest pain, shortness of breath.  She has no other concerns today.  She is a former patient of Dr. Paulla Dolly several years ago.    Review of Systems  All other systems reviewed and are negative.  Past Medical History:  Diagnosis Date  . Anxiety   . Arthritis   . Chronic pain    a. Prior h/o chronic pain on methadone.  . Chronic systolic CHF (congestive heart failure) (HCC)    a. mixed ischemic/non-ischemic cardiomyopathy. b. s/p CRT-D in 06/2014.  Marland Kitchen CKD (chronic kidney disease), stage III ([place])   . Complication of anesthesia    hard time waking her up from general surgery  . Coronary artery disease    a. remote RCA stenting in 2008 with non-DES. b. Cath 06/2014 following abnormal nuc: Stable, unchanged from prior cath, patent stent and 40% LM.  . Diabetes mellitus ([place])   . GERD (gastroesophageal reflux disease)   . Headache   . Hyperlipidemia   . Hypertension   . LBBB (left bundle branch block)   . Nonischemic cardiomyopathy ([place])   . OSA (obstructive sleep apnea)    AHI-9.77/hr, during REM-50.32/hr  . Tobacco abuse     Past Surgical History:  Procedure  Laterality Date  . BACK SURGERY  2013  . BI-VENTRICULAR IMPLANTABLE CARDIOVERTER DEFIBRILLATOR N/A 06/11/2014   STJ CRTD implanted by Dr Caryl Comes  . CARDIAC CATHETERIZATION  12/14/2006   RCA stented with a 3.0 [employer] Liberte stent resulting in a reduction of 75% to 0% residual  . CHOLECYSTECTOMY     20 years ago  . COLONOSCOPY WITH PROPOFOL N/A 12/15/2014   Procedure: COLONOSCOPY WITH PROPOFOL;  Surgeon: Clarene Essex, MD;  Location: [facility] ENDOSCOPY;  Service: Endoscopy;  Laterality: N/A;  . EYE SURGERY     bilateral cataract surgery   . LEFT AND RIGHT HEART CATHETERIZATION WITH CORONARY ANGIOGRAM N/A 05/29/2014   Procedure: LEFT AND RIGHT HEART CATHETERIZATION WITH CORONARY ANGIOGRAM;  Surgeon: Lorretta Harp, MD;  Location[facility] CATH LAB;  Service: Cardiovascular;  Laterality: N/A;  . LEFT HEART CATHETERIZATION WITH CORONARY ANGIOGRAM N/A 12/27/2012   Procedure: LEFT HEART CATHETERIZATION WITH CORONARY ANGIOGRAM;  Surgeon: Lorretta Harp, MD;  Location: [facility] CATH LAB;  Service: Cardiovascular;  Laterality: N/A;     Current Outpatient Medications:  .  ACCU-CHEK AVIVA PLUS test strip, 1 each by Other route as needed (glucose). , Disp: , Rfl:  .  ALPRAZolam (XANAX) 1 MG tablet, Take 1 mg by mouth 3 (three) times daily  as needed for anxiety or sleep. , Disp: , Rfl:  .  aspirin EC 81 MG tablet, Take 81 mg by mouth at bedtime., Disp: , Rfl:  .  busPIRone (BUSPAR) 15 MG tablet, Take 1 tablet (15 mg total) by mouth 2 (two) times daily., Disp: 60 tablet, Rfl: 3 .  carvedilol (COREG) 12.5 MG tablet, TAKE 1 AND 1/2 TABLETS TWICE DAILY WITH A MEAL  (DOSE  INCREASE), Disp: 270 tablet, Rfl: 1 .  cephALEXin (KEFLEX) 500 MG capsule, Take 1 capsule (500 mg total) by mouth 2 (two) times daily., Disp: 14 capsule, Rfl: 0 .  citalopram (CELEXA) 40 MG tablet, Take 1 tablet by mouth daily., Disp: , Rfl:  .  clopidogrel (PLAVIX) 75 MG tablet, TAKE 1 TABLET EVERY DAY, Disp: 90 tablet, Rfl: 1 .  diclofenac sodium  (VOLTAREN) 1 % GEL, Apply 2 g topically 4 (four) times daily., Disp: 100 g, Rfl: 2 .  furosemide (LASIX) 40 MG tablet, TAKE 2 TABLETS EVERY MORNING  AND TAKE 1 TABLET EVERY EVENING, Disp: 90 tablet, Rfl: 11 .  Insulin Detemir (LEVEMIR) 100 UNIT/ML Pen, Inject 26 Units into the skin daily at 10 pm., Disp: , Rfl:  .  levothyroxine (SYNTHROID, LEVOTHROID) 112 MCG tablet, Take 112 mcg by mouth at bedtime. , Disp: , Rfl:  .  MELATONIN PO, Take 2 tablets by mouth at bedtime., Disp: , Rfl:  .  nitroGLYCERIN (NITROSTAT) 0.4 MG SL tablet, Place 1 tablet (0.4 mg total) under the tongue every 5 (five) minutes as needed for chest pain., Disp: 25 tablet, Rfl: 3 .  oxyCODONE-acetaminophen (PERCOCET/ROXICET) 5-325 MG tablet, Take 1 tablet by mouth every 6 (six) hours as needed for severe pain. , Disp: , Rfl:  .  pantoprazole (PROTONIX) 40 MG tablet, Take 40 mg by mouth 2 (two) times daily. , Disp: , Rfl:  .  potassium chloride SA (K-DUR,KLOR-CON) 20 MEQ tablet, TAKE 1 TABLET EVERY DAY  (DOSE  INCREASE), Disp: 90 tablet, Rfl: 3 .  simvastatin (ZOCOR) 20 MG tablet, Take 20 mg by mouth daily with supper. , Disp: , Rfl:  .  traMADol (ULTRAM) 50 MG tablet, Take 1 tablet (50 mg total) by mouth every 8 (eight) hours as needed., Disp: 30 tablet, Rfl: 0 .  clindamycin (CLEOCIN) 300 MG capsule, Take 1 capsule (300 mg total) by mouth 3 (three) times daily., Disp: 30 capsule, Rfl: 0  Allergies  Allergen Reactions  . Valium [Diazepam] Swelling    face    Social History   Socioeconomic History  . Marital status: Married    Spouse name: Not on file  . Number of children: Not on file  . Years of education: Not on file  . Highest education level: Not on file  Social Needs  . Financial resource strain: Not on file  . Food insecurity - worry: Not on file  . Food insecurity - inability: Not on file  . Transportation needs - medical: Not on file  . Transportation needs - non-medical: Not on file  Occupational History    . Not on file  Tobacco Use  . Smoking status: Current Every Day Smoker    Packs/day: 2.00    Years: 58.00    Pack years: 116.00    Types: Cigarettes    Start date: 12/26/1952    Last attempt to quit: 12/05/2013    Years since quitting: 3.4  . Smokeless tobacco: Never Used  . Tobacco comment: uses Vape cigarettes  Substance and Sexual Activity  .  Alcohol use: No    Alcohol/week: 0.0 oz  . Drug use: No  . Sexual activity: Not on file  Other Topics Concern  . Not on file  Social History Narrative   Patient lives in [place] with his husband and grandson.   Son completed suicide on September 17th of this year, patient found him.        Objective:   Physical Exam  General: AAO x3, NAD  Dermatological: The plantar aspect of the right hallux is superficial wound measuring approximately 1.1 x 1.1 cm with a granular wound base.  There is no probing to bone, undermining or tunneling.  There is mild surrounding erythema but there is no ascending cellulitis.  There is no fluctuation or crepitation.  There is no malodor.  No other open lesions or pre-ulcerative lesion identified.  Vascular: Dorsalis Pedis artery and Posterior Tibial artery pedal pulses are decreased bilateral with immedate capillary fill time. PThere is no pain with calf compression, swelling, warmth, erythema.   Neruologic: Sensation decreased with Derrel Nip monofilament.  Musculoskeletal: No gross boney pedal deformities bilateral. No pain, crepitus, or limitation noted with foot and ankle range of motion bilateral. Muscular strength 5/5 in all groups tested bilateral.     Assessment & Plan:  77 year old female with right plantar hallux ulceration -Treatment options discussed including all alternatives, risks, and complications -Etiology of symptoms were discussed -X-rays were obtained and reviewed with the patient. No evidence of foreign body.  No evidence of acute fracture or evidence of acute  osteomyelitis identified at this time. -I would like for her to apply a small amount of antibiotic ointment to the wound daily. -Prescribed clindamycin. -Surgical shoe was dispensed. -We discussed glucose control.  Last A1c was 10.3. -I did an ABI in the office.  Left side with did show PAD the right side appear to be normal.  I ordered arterial duplex as well as consultation for this. -Monitor for any clinical signs or symptoms of infection and directed to call the office immediately should any occur or go to the ER. -RTC 1 week or sooner if needed.   [name] DPM

## 2017-05-12 ENCOUNTER — Encounter: Payer: Medicare HMO | Admitting: Nurse Practitioner

## 2017-05-12 ENCOUNTER — Telehealth: Payer: Self-pay | Admitting: *Deleted

## 2017-05-12 DIAGNOSIS — R0989 Other specified symptoms and signs involving the circulatory and respiratory systems: Secondary | ICD-10-CM

## 2017-05-12 MED ORDER — CLINDAMYCIN HCL 300 MG PO CAPS: 300.0000 mg | ORAL_CAPSULE | Freq: Three times a day (TID) | ORAL | 0 refills | Status: DC

## 2017-05-12 NOTE — Telephone Encounter (Signed)
Patient called and stated that she had not gotten her RX and I called that patient back and patient stated that she called the pharmacy and they did not have it and I stated that it was sent to the [facility] and she stated that it was on [address] and they had change that about a year ago and I stated that I would send that over to the pharmacy that she provided and to call us back if patient did not get the RX. [name]

## 2017-05-12 NOTE — Telephone Encounter (Addendum)
Left message informing [name] I had put in orders for pt to be in the [facility] and to call with appts and I would call pt with the information. I will fax order, referral, clinicals 05/11/2017 when received and demographics to [facility].

## 2017-05-12 NOTE — Telephone Encounter (Signed)
-----   Message from [name], DPM sent at 05/11/2017  5:13 PM EST ----- Can you please put in for an arterial duplex due to abnormal ABI in the office today? thanks

## 2017-05-13 ENCOUNTER — Ambulatory Visit: Payer: Medicare HMO | Admitting: Family Medicine

## 2017-05-15 ENCOUNTER — Telehealth: Payer: Self-pay | Admitting: *Deleted

## 2017-05-15 ENCOUNTER — Other Ambulatory Visit: Payer: Self-pay | Admitting: Podiatry

## 2017-05-15 ENCOUNTER — Telehealth: Payer: Self-pay | Admitting: Podiatry

## 2017-05-15 DIAGNOSIS — R0989 Other specified symptoms and signs involving the circulatory and respiratory systems: Secondary | ICD-10-CM

## 2017-05-15 MED ORDER — CLINDAMYCIN HCL 300 MG PO CAPS: 300.0000 mg | ORAL_CAPSULE | Freq: Three times a day (TID) | ORAL | 0 refills | Status: DC

## 2017-05-15 NOTE — Telephone Encounter (Signed)
Pt said she never received the Antibiotic RX that was supposed to be called to [facility] on [address](pt left vm).

## 2017-05-15 NOTE — Telephone Encounter (Signed)
Spoke with Cranford Mon, CMA and she had spoken with the patient and this was taken care of.

## 2017-05-15 NOTE — Telephone Encounter (Signed)
Faxed referral, orders and clinicals to [facility].

## 2017-05-15 NOTE — Addendum Note (Signed)
Addended by: Cranford Mon R on: 05/15/2017 09:34 AM   Modules accepted: Orders

## 2017-05-15 NOTE — Telephone Encounter (Signed)
Patient stated that the RX was not called in to the right pharmacy and I took care of that as of today and called and left a message for the patient and to call again if she has not received the RX. Lattie Haw

## 2017-05-17 ENCOUNTER — Ambulatory Visit (HOSPITAL_COMMUNITY)
Admission: RE | Admit: 2017-05-17 | Payer: Medicare HMO | Source: Ambulatory Visit | Attending: Podiatry | Admitting: Podiatry

## 2017-05-17 DIAGNOSIS — R0989 Other specified symptoms and signs involving the circulatory and respiratory systems: Secondary | ICD-10-CM

## 2017-05-20 ENCOUNTER — Ambulatory Visit: Payer: Medicare HMO | Admitting: Family Medicine

## 2017-05-22 ENCOUNTER — Ambulatory Visit: Payer: Medicare HMO | Admitting: Podiatry

## 2017-05-22 ENCOUNTER — Encounter (HOSPITAL_COMMUNITY): Payer: Self-pay | Admitting: Podiatry

## 2017-05-22 ENCOUNTER — Other Ambulatory Visit: Payer: Self-pay

## 2017-05-22 ENCOUNTER — Ambulatory Visit (INDEPENDENT_AMBULATORY_CARE_PROVIDER_SITE_OTHER): Payer: Medicare HMO | Admitting: Family Medicine

## 2017-05-22 ENCOUNTER — Encounter: Payer: Self-pay | Admitting: Family Medicine

## 2017-05-22 VITALS — BP 120/62 | HR 93 | Temp 98.7°F | Ht 65.0 in | Wt 160.0 lb

## 2017-05-22 DIAGNOSIS — N183 Chronic kidney disease, stage 3 unspecified: Secondary | ICD-10-CM

## 2017-05-22 DIAGNOSIS — E039 Hypothyroidism, unspecified: Secondary | ICD-10-CM | POA: Diagnosis not present

## 2017-05-22 DIAGNOSIS — L97519 Non-pressure chronic ulcer of other part of right foot with unspecified severity: Secondary | ICD-10-CM | POA: Diagnosis not present

## 2017-05-22 DIAGNOSIS — L84 Corns and callosities: Secondary | ICD-10-CM

## 2017-05-22 DIAGNOSIS — M545 Low back pain, unspecified: Secondary | ICD-10-CM

## 2017-05-22 DIAGNOSIS — E1149 Type 2 diabetes mellitus with other diabetic neurological complication: Secondary | ICD-10-CM

## 2017-05-22 DIAGNOSIS — J3089 Other allergic rhinitis: Secondary | ICD-10-CM

## 2017-05-22 DIAGNOSIS — F4323 Adjustment disorder with mixed anxiety and depressed mood: Secondary | ICD-10-CM

## 2017-05-22 DIAGNOSIS — M4726 Other spondylosis with radiculopathy, lumbar region: Secondary | ICD-10-CM | POA: Diagnosis not present

## 2017-05-22 DIAGNOSIS — Z794 Long term (current) use of insulin: Secondary | ICD-10-CM

## 2017-05-22 DIAGNOSIS — E1122 Type 2 diabetes mellitus with diabetic chronic kidney disease: Secondary | ICD-10-CM

## 2017-05-22 DIAGNOSIS — R69 Illness, unspecified: Secondary | ICD-10-CM | POA: Diagnosis not present

## 2017-05-22 MED ORDER — IPRATROPIUM BROMIDE 0.03 % NA SOLN: 1.0000 | Freq: Two times a day (BID) | NASAL | 11 refills | Status: DC

## 2017-05-22 MED ORDER — DICLOFENAC SODIUM 1 % TD GEL: 2.0000 g | Freq: Four times a day (QID) | TRANSDERMAL | 2 refills | Status: DC

## 2017-05-22 NOTE — Progress Notes (Signed)
3/18/201912:06 PM  [name] [birth year], 77 y.o. female [id number]  Chief Complaint  Patient presents with  . Follow-up    Ulcer of the right foot    HPI:   Patient is a 77 y.o. female with past medical history significant for DM2, CKD3, hypothyroidism, CAD, HLP chronic DDD pain, who presents today for followup on several issues.  Right foot ulcer of great toe, seen by podiatry, on clindamycin, callus removed, placed on hard sole shoe, doing much better. Sees podiatry later today.  Dm2, last a1c 10.3, was not following diet. Since then taking levemir 20units at bedtime, checks cbgs TID. Brings in log for past week. Fastings high 80-90s. Daytime cbgs 100-180s. No lows. Following low carb diet for most meals.  Hypothyroidism. tsh 8. Was not taking meds as prescribed. Now taking meds everyday. Doing well.   Mood disorder - patient with long standing depression and anxiety that were significantly impacted with the suicide of her son in sept 2018. She had been seeing psychiatry but cant afford $100 copay. She was given buspirone at last visit,did not start. She is in a much better place emotionally. She takes xanax 1mg  at bedtime, rarely she will take a second dose if having a very bad panic attack. She is wondering if I will refill this medication when time is due. She reports trazadone in the past has not helped.   Chronic lumbar DDD pain - patient has been treated with epidural injections, is on opiates. Oral NSAIDS not an option due to CKD3 and CAD. Last visit prescribed diclofenac gel, very happy with results, reports good pain relief. Able to do more chores and care better for her husband with vascular dementia States that a PA is needed.    Runny nose, clear, constant. Not seasonal. No sneezing, itchy eyes or ears. No sinus pain or sinus surgery. Has not tried anything for it ever.   Depression screen [facility] 2/9 05/22/2017 04/29/2017 04/21/2017  Decreased Interest 0 3 0  Down,  Depressed, Hopeless 0 3 0  PHQ - 2 Score 0 6 0  Altered sleeping - 3 -  Tired, decreased energy - 3 -  Change in appetite - 3 -  Feeling bad or failure about yourself  - 3 -  Trouble concentrating - 3 -  Moving slowly or fidgety/restless - 0 -  Suicidal thoughts - 0 -  PHQ-9 Score - 21 -    Allergies  Allergen Reactions  . Valium [Diazepam] Swelling    face    Prior to Admission medications   Medication Sig Start Date End Date Taking? Authorizing Provider  ACCU-CHEK AVIVA PLUS test strip 1 each by Other route as needed (glucose).  05/15/15  Yes [provider]  ALPRAZolam Duanne Moron) 1 MG tablet Take 1 mg by mouth 3 (three) times daily as needed for anxiety or sleep.    Yes [provider]  aspirin EC 81 MG tablet Take 81 mg by mouth at bedtime.   Yes [provider]  busPIRone (BUSPAR) 15 MG tablet Take 1 tablet (15 mg total) by mouth 2 (two) times daily. 04/29/17  Yes Jeffery, Chelle, PA-C  cephALEXin (KEFLEX) 500 MG capsule Take 1 capsule (500 mg total) by mouth 2 (two) times daily. 04/21/17  Yes Rutherford Guys, MD  citalopram (CELEXA) 40 MG tablet Take 1 tablet by mouth daily. 10/18/16  Yes [provider]  clindamycin (CLEOCIN) 300 MG capsule Take 1 capsule (300 mg total) by mouth 3 (  three) times daily. 05/15/17  Yes [name], DPM  clopidogrel (PLAVIX) 75 MG tablet TAKE 1 TABLET EVERY DAY 10/24/16  Yes Croitoru, Mihai, MD  diclofenac sodium (VOLTAREN) 1 % GEL Apply 2 g topically 4 (four) times daily. 04/21/17  Yes Rutherford Guys, MD  furosemide (LASIX) 40 MG tablet TAKE 2 TABLETS EVERY MORNING  AND TAKE 1 TABLET EVERY EVENING 01/16/17  Yes Lorretta Harp, MD  Insulin Detemir (LEVEMIR) 100 UNIT/ML Pen Inject 26 Units into the skin daily at 10 pm.   Yes [provider]  levothyroxine (SYNTHROID, LEVOTHROID) 112 MCG tablet Take 112 mcg by mouth at bedtime.    Yes [provider]  MELATONIN PO Take 2 tablets by mouth at  bedtime.   Yes [provider]  nitroGLYCERIN (NITROSTAT) 0.4 MG SL tablet Place 1 tablet (0.4 mg total) under the tongue every 5 (five) minutes as needed for chest pain. 05/01/13  Yes Brett Canales, PA-C  oxyCODONE-acetaminophen (PERCOCET/ROXICET) 5-325 MG tablet Take 1 tablet by mouth every 6 (six) hours as needed for severe pain.    Yes [provider]  pantoprazole (PROTONIX) 40 MG tablet Take 40 mg by mouth 2 (two) times daily.  04/30/13  Yes [provider]  potassium chloride SA (K-DUR,KLOR-CON) 20 MEQ tablet TAKE 1 TABLET EVERY DAY  (DOSE  INCREASE) 06/03/16  Yes Lorretta Harp, MD  simvastatin (ZOCOR) 20 MG tablet Take 20 mg by mouth daily with supper.    Yes [provider]  traMADol (ULTRAM) 50 MG tablet Take 1 tablet (50 mg total) by mouth every 8 (eight) hours as needed. 04/29/17  Yes Jeffery, Chelle, PA-C  carvedilol (COREG) 12.5 MG tablet TAKE 1 AND 1/2 TABLETS TWICE DAILY WITH A MEAL  (DOSE  INCREASE) Patient not taking: Reported on 05/22/2017 02/20/17   Sharmon Revere    Past Medical History:  Diagnosis Date  . Anxiety   . Arthritis   . Chronic pain    a. Prior h/o chronic pain on methadone.  . Chronic systolic CHF (congestive heart failure) (HCC)    a. mixed ischemic/non-ischemic cardiomyopathy. b. s/p CRT-D in 06/2014.  Marland Kitchen CKD (chronic kidney disease), stage III ([place])   . Complication of anesthesia    hard time waking her up from general surgery  . Coronary artery disease    a. remote RCA stenting in 2008 with non-DES. b. Cath 06/2014 following abnormal nuc: Stable, unchanged from prior cath, patent stent and 40% LM.  . Diabetes mellitus ([place])   . GERD (gastroesophageal reflux disease)   . Headache   . Hyperlipidemia   . Hypertension   . LBBB (left bundle branch block)   . Nonischemic cardiomyopathy ([place])   . OSA (obstructive sleep apnea)    AHI-9.77/hr, during REM-50.32/hr  . Tobacco abuse     Past Surgical History:    Procedure Laterality Date  . BACK SURGERY  2013  . BI-VENTRICULAR IMPLANTABLE CARDIOVERTER DEFIBRILLATOR N/A 06/11/2014   STJ CRTD implanted by Dr Caryl Comes  . CARDIAC CATHETERIZATION  12/14/2006   RCA stented with a 3.0 [employer] Liberte stent resulting in a reduction of 75% to 0% residual  . CHOLECYSTECTOMY     20 years ago  . COLONOSCOPY WITH PROPOFOL N/A 12/15/2014   Procedure: COLONOSCOPY WITH PROPOFOL;  Surgeon: Clarene Essex, MD;  Location: [facility];  Service: Endoscopy;  Laterality: N/A;  . EYE SURGERY     bilateral cataract surgery   . LEFT AND  RIGHT HEART CATHETERIZATION WITH CORONARY ANGIOGRAM N/A 05/29/2014   Procedure: LEFT AND RIGHT HEART CATHETERIZATION WITH CORONARY ANGIOGRAM;  Surgeon: Lorretta Harp, MD;  Location: [facility] CATH LAB;  Service: Cardiovascular;  Laterality: N/A;  . LEFT HEART CATHETERIZATION WITH CORONARY ANGIOGRAM N/A 12/27/2012   Procedure: LEFT HEART CATHETERIZATION WITH CORONARY ANGIOGRAM;  Surgeon: Lorretta Harp, MD;  Location: [facility] CATH LAB;  Service: Cardiovascular;  Laterality: N/A;    Social History   Tobacco Use  . Smoking status: Current Every Day Smoker    Packs/day: 2.00    Years: 58.00    Pack years: 116.00    Types: Cigarettes    Start date: 12/26/1951    Last attempt to quit: 12/05/2013    Years since quitting: 3.4  . Smokeless tobacco: Never Used  . Tobacco comment: uses Vape cigarettes  Substance Use Topics  . Alcohol use: No    Alcohol/week: 0.0 oz    Family History  Problem Relation Age of Onset  . Stroke Mother   . Hypertension Mother   . Coronary artery disease Father   . Stroke Brother   . Heart disease Brother   . Other Brother        H1N1 VIRUS  . Healthy Sister   . Healthy Sister     Review of Systems  Constitutional: Negative for chills and fever.  Respiratory: Negative for cough and shortness of breath.   Cardiovascular: Negative for chest pain, palpitations and leg swelling.  Gastrointestinal: Negative  for abdominal pain, nausea and vomiting.     OBJECTIVE:  Blood pressure 120/62, pulse 93, temperature 98.7 F (37.1 C), temperature source Oral, height 5\' 5"  (1.651 m), weight 160 lb (72.6 kg), SpO2 97 %.  Physical Exam  Constitutional: She is oriented to person, place, and time and well-developed, well-nourished, and in no distress.  HENT:  Head: Normocephalic and atraumatic.  Mouth/Throat: Mucous membranes are normal.  Eyes: EOM are normal. Pupils are equal, round, and reactive to light. No scleral icterus.  Neck: Neck supple.  Pulmonary/Chest: Effort normal.  Neurological: She is alert and oriented to person, place, and time. Gait normal.  Skin: Skin is warm and dry.  Toe pad of right great toe, no ulcer seen  Psychiatric: Mood and affect normal.  Nursing note and vitals reviewed.   ASSESSMENT and PLAN  1. Ulcer of right foot, unspecified ulcer stage ([place]) Resolved. Complete abx, follow-up with podiatry  2. Type 2 diabetes mellitus with stage 3 chronic kidney disease, with long-term current use of insulin (HCC) cbgs at goal. Continue with levemir. No metformin nor glipizide due to ckd. If further control needed, short acting insulin would most likely next step. Recheck a1c at next visit.  3. Hypothyroidism, unspecified type improved compliance. Recheck TSH at next visit.   4. Osteoarthritis of spine with radiculopathy, lumbar region Improved pain control and function with addition of diclofenac get. Will pursue PA.  - diclofenac sodium (VOLTAREN) 1 % GEL; Apply 2 g topically 4 (four) times daily.   5. Adjustment reaction with anxiety and depression Improved. Dc buspar. Cont with celexa and qhs xanax. Discussed with patient that goal would be to find a different medication for bedtime, however will work on stabilizing over conditions first.   6. Non-seasonal allergic rhinitis, unspecified trigger - ipratropium (ATROVENT) 0.03 % nasal spray; Place 1 spray into both  nostrils 2 (two) times daily.   Return in about 2 months (around 07/22/2017).    Rutherford Guys, MD Primary  Care at [address], [place] [address] Ph.  [phone] Fax [phone]

## 2017-05-22 NOTE — Patient Instructions (Signed)
     IF you received an x-ray today, you will receive an invoice from [facility]. Please contact [facility] at [phone] with questions or concerns regarding your invoice.   IF you received labwork today, you will receive an invoice from [facility]. Please contact [facility] at [phone] with questions or concerns regarding your invoice.   Our billing staff will not be able to assist you with questions regarding bills from these companies.  You will be contacted with the lab results as soon as they are available. The fastest way to get your results is to activate your My Chart account. Instructions are located on the last page of this paperwork. If you have not heard from us regarding the results in 2 weeks, please contact this office.     

## 2017-05-22 NOTE — Progress Notes (Signed)
Subjective: [name] presents the office today for follow-up evaluation on the bottom of her right big toe.  She states that she is still on antibiotics.  Her blood sugars been much better controlled.  She denies any drainage or pus.  She did notice a callus forming the side of the toe when she try to trim this herself and cause a small area to bleed.  She denies any pus to the area she denies any increase in swelling or redness or any drainage or any red streaks.  Overall she feels that she is doing much better. Denies any systemic complaints such as fevers, chills, nausea, vomiting. No acute changes since last appointment, and no other complaints at this time.   Objective: AAO x3, NAD DP/PT pulses palpable bilaterally, CRT less than 3 seconds On the plantar aspect of the right hallux is a hyperkeratotic lesion as well as the medial aspect.  Upon debridement it appears that both areas, the old wound as well as the new area when she tries from the callus of both heels.  There is no definitive skin breakdown identified today there is no drainage or pus.  There is no surrounding erythema, ascending sialitis.  There is very minimal edema to the toe.  There is no clinical signs of infection or otherwise.  Overall her nails are somewhat dystrophic, discolored with ill-defined discoloration and thickened. No open lesions or pre-ulcerative lesions.  No pain with calf compression, swelling, warmth, erythema  Assessment: Healed wound right hallux  Plan: -All treatment options discussed with the patient including all alternatives, risks, complications.  -I did debride the hyperkeratotic tissue on the area the previous wound with underlying wound is healed.  On her finished course of antibiotics as well as using a small amount of antibiotic ointment and a bandage daily.  Continue to monitor for any reoccurrence. -I prefer her not to try to trim her nails and calluses herself as she does cut herself.  I will see  her back in 3 weeks to make sure that the wound stayed healed and also will start routine care.  If she is doing well we will get him every 107-month schedule for this.  She agrees with this and would like to do this. -Patient encouraged to call the office with any questions, concerns, change in symptoms.   [name] DPM

## 2017-05-26 ENCOUNTER — Ambulatory Visit: Payer: Medicare HMO | Admitting: Cardiovascular Disease

## 2017-05-26 ENCOUNTER — Other Ambulatory Visit: Payer: Self-pay | Admitting: Family Medicine

## 2017-05-26 NOTE — Telephone Encounter (Signed)
Patient is requesting historical medications: levothyroxine 112 mcg She is also requesting diabetic supplies- need to know how often she needs to be testing her glucose levels for order. Accu-check Aviva Plus Strips and Lancets  LOV: 05/22/17  PCP: [place]: verified

## 2017-05-26 NOTE — Telephone Encounter (Signed)
Copied from [place]. Topic: Quick Communication - Rx Refill/Question >> May 26, 2017  8:44 AM Scherrie Gerlach wrote: Medication: ACCU-CHEK AVIVA PLUS test strip                     ACCU-CHEK AVIVA PLUS lancets  Also, pt states she was seen in the office 3/18 and her levothyroxine (SYNTHROID, LEVOTHROID) 112 MCG tablet was to called in.  The pharmacy does not have. Please send  [facility], [place] [phone] (Phone) [phone] (Fax)

## 2017-05-27 NOTE — Telephone Encounter (Signed)
Please advise/refill synthroid and accu- chek aviva plus test strip  See message  Pt is requesting historical medications and diabetic supplies

## 2017-05-29 ENCOUNTER — Ambulatory Visit: Payer: Medicare HMO | Admitting: Internal Medicine

## 2017-05-29 ENCOUNTER — Encounter: Payer: Self-pay | Admitting: Internal Medicine

## 2017-05-29 VITALS — BP 104/60 | HR 80 | Ht 65.0 in | Wt 160.0 lb

## 2017-05-29 DIAGNOSIS — I447 Left bundle-branch block, unspecified: Secondary | ICD-10-CM | POA: Diagnosis not present

## 2017-05-29 DIAGNOSIS — Z95810 Presence of automatic (implantable) cardiac defibrillator: Secondary | ICD-10-CM

## 2017-05-29 DIAGNOSIS — I428 Other cardiomyopathies: Secondary | ICD-10-CM

## 2017-05-29 DIAGNOSIS — I5022 Chronic systolic (congestive) heart failure: Secondary | ICD-10-CM | POA: Diagnosis not present

## 2017-05-29 LAB — CUP PACEART INCLINIC DEVICE CHECK
Battery Remaining Longevity: 52 mo
Brady Statistic RA Percent Paced: 6.7 %
Brady Statistic RV Percent Paced: 99.54 %
Date Time Interrogation Session: 20190325150430
HighPow Impedance: 77.625
Implantable Lead Implant Date: 20160406
Implantable Lead Implant Date: 20160406
Implantable Lead Implant Date: 20160406
Implantable Lead Location: 753858
Implantable Lead Location: 753859
Implantable Lead Location: 753860
Implantable Lead Model: 7122
Implantable Pulse Generator Implant Date: 20160406
Lead Channel Impedance Value: 1187.5 Ohm
Lead Channel Impedance Value: 375 Ohm
Lead Channel Impedance Value: 650 Ohm
Lead Channel Pacing Threshold Amplitude: 0.75 V
Lead Channel Pacing Threshold Amplitude: 0.75 V
Lead Channel Pacing Threshold Amplitude: 1 V
Lead Channel Pacing Threshold Amplitude: 1 V
Lead Channel Pacing Threshold Amplitude: 1.25 V
Lead Channel Pacing Threshold Amplitude: 1.25 V
Lead Channel Pacing Threshold Pulse Width: 0.5 ms
Lead Channel Pacing Threshold Pulse Width: 0.5 ms
Lead Channel Pacing Threshold Pulse Width: 0.5 ms
Lead Channel Pacing Threshold Pulse Width: 0.5 ms
Lead Channel Pacing Threshold Pulse Width: 0.5 ms
Lead Channel Pacing Threshold Pulse Width: 0.5 ms
Lead Channel Sensing Intrinsic Amplitude: 12 mV
Lead Channel Sensing Intrinsic Amplitude: 5 mV
Lead Channel Setting Pacing Amplitude: 1.875
Lead Channel Setting Pacing Amplitude: 2 V
Lead Channel Setting Pacing Amplitude: 2.125
Lead Channel Setting Pacing Pulse Width: 0.5 ms
Lead Channel Setting Pacing Pulse Width: 0.5 ms
Lead Channel Setting Sensing Sensitivity: 0.5 mV
Pulse Gen Serial Number: 7199559

## 2017-05-29 MED ORDER — LEVOTHYROXINE SODIUM 112 MCG PO TABS
112.0000 ug | ORAL_TABLET | Freq: Every day | ORAL | 1 refills | Status: DC
Start: 2017-05-29 — End: 2017-06-26

## 2017-05-29 MED ORDER — ACCU-CHEK AVIVA PLUS VI STRP: 1.0000 | ORAL_STRIP | Freq: Three times a day (TID) | 1 refills | Status: DC

## 2017-05-29 NOTE — Progress Notes (Signed)
Patient Care Team: Rutherford Guys, MD as PCP - General (Family Medicine) Lorretta Harp, MD as Consulting Physician (Cardiology) Chucky May, MD as Consulting Physician (Psychiatry) Kristeen Miss, MD as Consulting Physician (Neurosurgery)   HPI  [name] is a 77 y.o. female Seen in follow-up for CRT- ICD implanted 4/16 for ischemic cardiomyopathy with prior stenting.  She has a history of congestive heart failure and left bundle branch block. Prior syncope.  .  Dr Hiram Comber aware of antiplatlet therapy it was his decision that she stay on DAPT  No syncope.  Fleeting chest pains occurring frequently.  Typically lasting 5-10 seconds.  Her hemoglobin A1c most recently was 10.3.  She says that she all that this happened again.  Her son, [name], died ([place])  11/27/22 in the context of chronic pain.  No edema  She aslo was not taking synthroid, but is taking it again   To be checked next month.   DATE TEST EF   4/16 Echo   20-25 %   8/17 Echo   50-55 %         Date Cr K TSH  2/19 1.28 4.6  8.440              Past Medical History:  Diagnosis Date  . Anxiety   . Arthritis   . Chronic pain    a. Prior h/o chronic pain on methadone.  . Chronic systolic CHF (congestive heart failure) (HCC)    a. mixed ischemic/non-ischemic cardiomyopathy. b. s/p CRT-D in 06/2014.  Marland Kitchen CKD (chronic kidney disease), stage III ([place])   . Complication of anesthesia    hard time waking her up from general surgery  . Coronary artery disease    a. remote RCA stenting in 2008 with non-DES. b. Cath 06/2014 following abnormal nuc: Stable, unchanged from prior cath, patent stent and 40% LM.  . Diabetes mellitus ([place])   . GERD (gastroesophageal reflux disease)   . Headache   . Hyperlipidemia   . Hypertension   . LBBB (left bundle branch block)   . Nonischemic cardiomyopathy ([place])   . OSA (obstructive sleep apnea)    AHI-9.77/hr, during REM-50.32/hr  . Tobacco abuse     Past Surgical History:    Procedure Laterality Date  . BACK SURGERY  2013  . BI-VENTRICULAR IMPLANTABLE CARDIOVERTER DEFIBRILLATOR N/A 06/11/2014   STJ CRTD implanted by Dr Caryl Comes  . CARDIAC CATHETERIZATION  12/14/2006   RCA stented with a 3.0 [employer] Liberte stent resulting in a reduction of 75% to 0% residual  . CHOLECYSTECTOMY     20 years ago  . COLONOSCOPY WITH PROPOFOL N/A 12/15/2014   Procedure: COLONOSCOPY WITH PROPOFOL;  Surgeon: Clarene Essex, MD;  Location: [facility];  Service: Endoscopy;  Laterality: N/A;  . EYE SURGERY     bilateral cataract surgery   . LEFT AND RIGHT HEART CATHETERIZATION WITH CORONARY ANGIOGRAM N/A 05/29/2014   Procedure: LEFT AND RIGHT HEART CATHETERIZATION WITH CORONARY ANGIOGRAM;  Surgeon: Lorretta Harp, MD;  [facility] C[facility] LAB;  Service: Cardiovascular;  Laterality: N/A;  . LEFT HEART CATHETERIZATION WITH CORONARY ANGIOGRAM N/A 12/27/2012   Procedure: LEFT HEART CATHETERIZATION WITH CORONARY ANGIOGRAM;  Surgeon: Lorretta Harp, MD;  Location: [facility] CATH LAB;  Service: Cardiovascular;  Laterality: N/A;    Current Outpatient Medications  Medication Sig Dispense Refill  . ACCU-CHEK AVIVA PLUS test strip 1 each by Other route 3 (three) times daily before meals. DX E11.65, Z79.4  100 each 1  . ALPRAZolam (XANAX) 1 MG tablet Take 1 mg by mouth at bedtime as needed and may repeat dose one time if needed for anxiety or sleep.    Marland Kitchen aspirin EC 81 MG tablet Take 81 mg by mouth at bedtime.    . carvedilol (COREG) 12.5 MG tablet TAKE 1 AND 1/2 TABLETS TWICE DAILY WITH A MEAL  (DOSE  INCREASE) 270 tablet 1  . citalopram (CELEXA) 40 MG tablet Take 1 tablet by mouth daily.    . clopidogrel (PLAVIX) 75 MG tablet TAKE 1 TABLET EVERY DAY 90 tablet 1  . diclofenac sodium (VOLTAREN) 1 % GEL Apply 2 g topically 4 (four) times daily. 100 g 2  . furosemide (LASIX) 40 MG tablet TAKE 2 TABLETS EVERY MORNING  AND TAKE 1 TABLET EVERY EVENING 90 tablet 11  . Insulin Detemir (LEVEMIR) 100 UNIT/ML  Pen Inject 20 Units into the skin daily at 10 pm.    . ipratropium (ATROVENT) 0.03 % nasal spray Place 1 spray into both nostrils 2 (two) times daily. 30 mL 11  . levothyroxine (SYNTHROID, LEVOTHROID) 112 MCG tablet Take 1 tablet (112 mcg total) by mouth at bedtime. 30 tablet 1  . MELATONIN PO Take 2 tablets by mouth at bedtime.    . nitroGLYCERIN (NITROSTAT) 0.4 MG SL tablet Place 1 tablet (0.4 mg total) under the tongue every 5 (five) minutes as needed for chest pain. 25 tablet 3  . oxyCODONE-acetaminophen (PERCOCET/ROXICET) 5-325 MG tablet Take 1 tablet by mouth every 6 (six) hours as needed for severe pain.     . pantoprazole (PROTONIX) 40 MG tablet Take 40 mg by mouth 2 (two) times daily.     . potassium chloride SA (K-DUR,KLOR-CON) 20 MEQ tablet TAKE 1 TABLET EVERY DAY  (DOSE  INCREASE) 90 tablet 3  . simvastatin (ZOCOR) 20 MG tablet Take 20 mg by mouth daily with supper.      No current facility-administered medications for this visit.     Allergies  Allergen Reactions  . Valium [Diazepam] Swelling    face      Review of Systems negative except from HPI and PMH  Physical Exam BP 104/60   Pulse 80   Ht 5\' 5"  (1.651 m)   Wt 160 lb (72.6 kg)   SpO2 98%   BMI 26.63 kg/m  Well developed and nourished in no acute distress HENT normal Neck supple with JVP-flat Clear Regular rate and rhythm, no murmurs or gallops Abd-soft with active BS No Clubbing cyanosis edema Skin-warm and dry A & Oriented  Grossly normal sensory and motor function   ECG sinus with P synchronous pacing with a negative QRS in lead I and rS lead V1 Intervals 18/12/36 Infrequent PVC   Assessment and  Plan Ischemic cardiomyopathy with interval normalization of LV function   Congestive heart failure-chronic-diastolic  Hypertension  Orthostatic hypotension  Diabetes  CRT-D-St. Jude The patient's device was interrogated.  The information was reviewed. No changes were made in the programming.     Grief  She notes that she had "stopped caring "in the wake of her son's suicide did stop taking her medicine.  She is back on board with medications.  Blood pressure is well controlled and is without symptomatic lightheadedness.  Without symptoms of ischemia  Chest pains are atypical I do not think that they are angina.  We spent more than 50% of our >25 min visit in face to face counseling regarding the above

## 2017-05-29 NOTE — Patient Instructions (Addendum)
Medication Instructions:  Your physician recommends that you continue on your current medications as directed. Please refer to the Current Medication list given to you today.  Labwork: None ordered.  Testing/Procedures: None ordered.  Follow-Up: Your physician recommends that you schedule a follow-up appointment in:   One year with Tommye Standard, PA  Remote monitoring is used to monitor your Pacemaker from home. This monitoring reduces the number of office visits required to check your device to one time per year. It allows [place] to keep an eye on the functioning of your device to ensure it is working properly. You are scheduled for a device check from home on 06/12/2017. You may send your transmission at any time that day. If you have a wireless device, the transmission will be sent automatically. After your physician reviews your transmission, you will receive a postcard with your next transmission date.      Any Other Special Instructions Will Be Listed Below (If Applicable).     If you need a refill on your cardiac medications before your next appointment, please call your pharmacy.

## 2017-05-30 ENCOUNTER — Other Ambulatory Visit: Payer: Self-pay | Admitting: Family Medicine

## 2017-05-30 NOTE — Telephone Encounter (Signed)
Copied from [place] [id number]. Topic: Quick Communication - Rx Refill/Question >> May 30, 2017 11:08 AM Cleaster Corin, NT wrote: Medication: simvastatin (ZOCOR) 20 MG tablet [[id number]]  One touch meter, test strips and lancets  Has the patient contacted their pharmacy? Yes (Agent: If no, request that the patient contact the pharmacy for the refill.) Preferred Pharmacy (with phone number or street name): [place], [place] [address] Phone: [phone] Fax: [phone]   Agent: Please be advised that RX refills may take up to 3 business days. We ask that you follow-up with your pharmacy.

## 2017-05-30 NOTE — Telephone Encounter (Signed)
LOV: 05/22/17  Dr. Carol [place]

## 2017-05-30 NOTE — Addendum Note (Signed)
Addended by: Delle Reining on: 05/30/2017 05:26 PM   Modules accepted: Orders

## 2017-05-30 NOTE — Telephone Encounter (Signed)
Pt called back - she thinks the one touch ultra meter, strips, and lancets. Dr. Doug Sou originally prescribed simvastatin 20mg  (her former PCP)

## 2017-05-30 NOTE — Telephone Encounter (Signed)
Left message with husband. Do not see simvastatin on med list. It is listed "historical med."  Need to clarify with meter she is requesting and who prescribed the simvastatin.

## 2017-06-01 MED ORDER — SIMVASTATIN 20 MG PO TABS
20.0000 mg | ORAL_TABLET | Freq: Every day | ORAL | 5 refills | Status: DC
Start: 2017-06-01 — End: 2017-10-24

## 2017-06-01 NOTE — Telephone Encounter (Signed)
Cardiology has rx the simvastatin previously. Please advise

## 2017-06-01 NOTE — Addendum Note (Signed)
Addended by: Rutherford Guys on: 06/01/2017 12:59 PM   Modules accepted: Orders

## 2017-06-02 ENCOUNTER — Ambulatory Visit (HOSPITAL_COMMUNITY)
Admission: RE | Admit: 2017-06-02 | Discharge: 2017-06-02 | Disposition: A | Discharge status: Home / Self Care | Payer: Medicare HMO | Source: Ambulatory Visit | Attending: Cardiology | Admitting: Cardiology

## 2017-06-02 ENCOUNTER — Telehealth: Payer: Self-pay | Admitting: Internal Medicine

## 2017-06-02 DIAGNOSIS — R0989 Other specified symptoms and signs involving the circulatory and respiratory systems: Secondary | ICD-10-CM | POA: Diagnosis not present

## 2017-06-02 DIAGNOSIS — R200 Anesthesia of skin: Secondary | ICD-10-CM | POA: Diagnosis not present

## 2017-06-02 DIAGNOSIS — I2510 Atherosclerotic heart disease of native coronary artery without angina pectoris: Secondary | ICD-10-CM | POA: Diagnosis not present

## 2017-06-02 DIAGNOSIS — E119 Type 2 diabetes mellitus without complications: Secondary | ICD-10-CM | POA: Diagnosis not present

## 2017-06-02 DIAGNOSIS — F17200 Nicotine dependence, unspecified, uncomplicated: Secondary | ICD-10-CM | POA: Insufficient documentation

## 2017-06-02 DIAGNOSIS — I10 Essential (primary) hypertension: Secondary | ICD-10-CM | POA: Diagnosis not present

## 2017-06-02 DIAGNOSIS — E785 Hyperlipidemia, unspecified: Secondary | ICD-10-CM | POA: Diagnosis not present

## 2017-06-02 DIAGNOSIS — R69 Illness, unspecified: Secondary | ICD-10-CM | POA: Diagnosis not present

## 2017-06-02 MED ORDER — CARVEDILOL 12.5 MG PO TABS: ORAL_TABLET | ORAL | 1 refills | Status: DC

## 2017-06-02 NOTE — Telephone Encounter (Signed)
New Message   *STAT* If patient is at the pharmacy, call can be transferred to refill team.   1. Which medications need to be refilled? (please list name of each medication and dose if known) carvedilol (COREG) 12.5 MG tablet  2. Which pharmacy/location (including street and city if local pharmacy) is medication to be sent to? [place], [address]  3. Do they need a 30 day or 90 day supply? [place]

## 2017-06-05 ENCOUNTER — Telehealth: Payer: Self-pay | Admitting: Family Medicine

## 2017-06-05 NOTE — Telephone Encounter (Signed)
Copied from [place]. Topic: Quick Communication - Rx Refill/Question >> Jun 05, 2017 10:41 AM Oliver Pila B wrote: Medication: potassium chloride SA (K-DUR,KLOR-CON) 20 MEQ tablet [[id number]], one touch meter, one touch lancets, one touch test strips (90 day supply) Has the patient contacted their pharmacy? Yes.   (Agent: If no, request that the patient contact the pharmacy for the refill.) Preferred Pharmacy (with phone number or street name): [facility] Agent: Please be advised that RX refills may take up to 3 business days. We ask that you follow-up with your pharmacy.

## 2017-06-05 NOTE — Telephone Encounter (Signed)
Pt is requesting diabetes supples: One Touch meter, One Touch lancets and One Tough test strips. She had been using the accu-check aviva plus according to her med list. She is also requesting a refill on potassium chloride SA, written by her cardiologist. This med expired on 06/03/17.  Last HA1C was on 04/21/17 was 10.3 Last K+ was on 04/21/17 was 4.6  Provider: Grant Fontana, MD  Please review.

## 2017-06-06 ENCOUNTER — Other Ambulatory Visit: Payer: Self-pay | Admitting: Family Medicine

## 2017-06-06 MED ORDER — BLOOD GLUCOSE MONITOR KIT: PACK | 11 refills | Status: DC

## 2017-06-06 NOTE — Telephone Encounter (Signed)
Faxed prescription to [facility].

## 2017-06-06 NOTE — Telephone Encounter (Signed)
[place] called to check on status of DM supplies.   [phone]    She states pt has not been able to check blood sugar -  sending this high priority.

## 2017-06-06 NOTE — Telephone Encounter (Signed)
Please advise on new rx for new glucose meter and test strips. Pt would like One touch meter and test strips.

## 2017-06-07 ENCOUNTER — Telehealth: Payer: Self-pay | Admitting: Podiatry

## 2017-06-07 DIAGNOSIS — R69 Illness, unspecified: Secondary | ICD-10-CM | POA: Diagnosis not present

## 2017-06-07 NOTE — Telephone Encounter (Signed)
Attempted to call patient to discuss ultrasound results. No answer.

## 2017-06-12 ENCOUNTER — Ambulatory Visit: Payer: Medicare HMO | Admitting: Podiatry

## 2017-06-19 ENCOUNTER — Telehealth: Payer: Self-pay | Admitting: Cardiovascular Disease

## 2017-06-19 ENCOUNTER — Ambulatory Visit: Payer: Medicare HMO | Admitting: Podiatry

## 2017-06-19 NOTE — Telephone Encounter (Signed)
New Message    *STAT* If patient is at the pharmacy, call can be transferred to refill team.   1. Which medications need to be refilled? (please list name of each medication and dose if known) potassium chloride SA (K-DUR,KLOR-CON) 20 MEQ tablet  2. Which pharmacy/location (including street and city if local pharmacy) is medication to be sent to? [facility].  3. Do they need a 30 day or 90 day supply? 90 day

## 2017-06-20 ENCOUNTER — Encounter: Payer: Self-pay | Admitting: Podiatry

## 2017-06-20 ENCOUNTER — Ambulatory Visit (INDEPENDENT_AMBULATORY_CARE_PROVIDER_SITE_OTHER): Payer: Medicare HMO

## 2017-06-20 ENCOUNTER — Ambulatory Visit: Payer: Medicare HMO | Admitting: Podiatry

## 2017-06-20 VITALS — BP 97/61 | HR 83 | Temp 98.0°F | Resp 18

## 2017-06-20 DIAGNOSIS — L97519 Non-pressure chronic ulcer of other part of right foot with unspecified severity: Secondary | ICD-10-CM

## 2017-06-20 DIAGNOSIS — I70235 Atherosclerosis of native arteries of right leg with ulceration of other part of foot: Secondary | ICD-10-CM

## 2017-06-20 DIAGNOSIS — L02619 Cutaneous abscess of unspecified foot: Secondary | ICD-10-CM | POA: Diagnosis not present

## 2017-06-20 MED ORDER — POTASSIUM CHLORIDE CRYS ER 20 MEQ PO TBCR: EXTENDED_RELEASE_TABLET | ORAL | 1 refills | Status: DC

## 2017-06-20 MED ORDER — AMOXICILLIN-POT CLAVULANATE 875-125 MG PO TABS: 1.0000 | ORAL_TABLET | Freq: Two times a day (BID) | ORAL | 0 refills | Status: DC

## 2017-06-20 NOTE — Patient Instructions (Signed)
Start your antibiotic today Change the bandage once a day like we discussed.  If there is any worsening go to the emergency room  If you have any questions, please give me a call at [phone] or I will see you on Thursday unless you need anything at all.  Hope you feel better!

## 2017-06-21 ENCOUNTER — Telehealth: Payer: Self-pay | Admitting: Family Medicine

## 2017-06-21 NOTE — Telephone Encounter (Addendum)
Copied from [place]. Topic: Quick Communication - See Telephone Encounter >> Jun 21, 2017 10:59 AM Ivar Drape wrote: CRM for notification. See Telephone encounter for: 06/21/17. Patient needs to know if there is something she can take for her diabetes that is slow acting and fast acting insulin.  This is what her children take, but they are Type 1 diabetes. Patient is asking that someone give her a call back today.

## 2017-06-21 NOTE — Telephone Encounter (Signed)
Please advise 

## 2017-06-21 NOTE — Progress Notes (Signed)
Subjective: [name] presents the office today for concerns of increasing swelling and pain to her right big toe which she first started noticing last week.  Unfortunately she has canceled her last 2 appointments with me due to other issues she reports.  She states that last week she started noticed that her toe became more painful and swollen but she did could not get to the office until today given other issues.  She also states that over the weekend she thought about going to the emergency room but she did not go.  She denies any recent injury or trauma to her foot. Denies any systemic complaints such as fevers, chills, nausea, vomiting. No acute changes since last appointment, and no other complaints at this time.   Objective: AAO x3, NAD DP/PT pulses palpable bilaterally, CRT less than 3 seconds There is edema and erythema to the right hallux but there is no ascending cellulitis.  There is no ulceration present in the plantar aspect the hallux and there appears to be a blister overlying the area as well.  Upon debridement of this area today there is quite a bit of purulence is expressed as well as malodor.  I did culture the purulence.  I debrided all nonviable tissue to granular tissue.  The wound after debridement measured 2 x 2 x 0.5 cm.  It does not probe to bone although it is close.  There is no undermining or tunneling.  There is no fluctuation or crepitation otherwise.   No open lesions or pre-ulcerative lesions.  No pain with calf compression, swelling, warmth, erythema  Assessment: Ulceration, abscess with cellulitis right hallux  Plan: -All treatment options discussed with the patient including all alternatives, risks, complications.  -X-rays were obtained and reviewed.  There is questionable changes of the distal phalanx concerning for osteomyelitis.  I debrided the wounds to reveal the abscess underneath the area and I did do a drainage of this area today in the office.  I was able  to remove quite a bit of purulence.  I did send this for culture.  I debrided the wound to healthy, granular tissue.  The area was irrigated with saline and saline wet-to-dry dressing was applied.  I showed her how to do this at home she is to continue with this.  I also provided her with supplies today.  Start Augmentin.  Darco shoe with a pegassit was dispensed.  Discussed that there is any worsening infection she is to go medially to the emergency room.  I discussed with her that she is a high chance of dictation.  I reviewed the previous arterial studies with her today. -We will see her back on Thursday for follow-up or sooner if needed.Patient encouraged to call the office with any questions, concerns, change in symptoms.   [name] DPM

## 2017-06-22 ENCOUNTER — Other Ambulatory Visit: Payer: Self-pay

## 2017-06-22 ENCOUNTER — Encounter: Payer: Self-pay | Admitting: Podiatry

## 2017-06-22 ENCOUNTER — Ambulatory Visit (INDEPENDENT_AMBULATORY_CARE_PROVIDER_SITE_OTHER): Payer: Medicare HMO | Admitting: Family Medicine

## 2017-06-22 ENCOUNTER — Encounter: Payer: Self-pay | Admitting: Family Medicine

## 2017-06-22 ENCOUNTER — Ambulatory Visit (INDEPENDENT_AMBULATORY_CARE_PROVIDER_SITE_OTHER): Payer: Medicare HMO | Admitting: Podiatry

## 2017-06-22 VITALS — BP 106/59 | HR 85

## 2017-06-22 VITALS — BP 128/62 | HR 96 | Temp 99.3°F | Resp 18 | Ht 65.0 in | Wt 153.2 lb

## 2017-06-22 DIAGNOSIS — R197 Diarrhea, unspecified: Secondary | ICD-10-CM | POA: Diagnosis not present

## 2017-06-22 DIAGNOSIS — E876 Hypokalemia: Secondary | ICD-10-CM | POA: Diagnosis not present

## 2017-06-22 DIAGNOSIS — E039 Hypothyroidism, unspecified: Secondary | ICD-10-CM | POA: Diagnosis not present

## 2017-06-22 DIAGNOSIS — N183 Chronic kidney disease, stage 3 unspecified: Secondary | ICD-10-CM

## 2017-06-22 DIAGNOSIS — L97519 Non-pressure chronic ulcer of other part of right foot with unspecified severity: Secondary | ICD-10-CM

## 2017-06-22 DIAGNOSIS — Z794 Long term (current) use of insulin: Secondary | ICD-10-CM | POA: Diagnosis not present

## 2017-06-22 DIAGNOSIS — L02619 Cutaneous abscess of unspecified foot: Secondary | ICD-10-CM | POA: Diagnosis not present

## 2017-06-22 DIAGNOSIS — E1122 Type 2 diabetes mellitus with diabetic chronic kidney disease: Secondary | ICD-10-CM | POA: Diagnosis not present

## 2017-06-22 NOTE — Progress Notes (Signed)
4/18/20194:48 PM  [name] Nov 18, 1940, 77 y.o. female [id number]  Chief Complaint  Patient presents with  . Medication Problem    wants to discuss diabetes meds     HPI:   Patient is a 77 y.o. female with past medical history significant for DM2 complicated diabetic foot ulcer who presents today to further discuss DM treatment  She was seen earlier this week by podiatry and her foot ulcer on her right big toe worsened, with concerns for osteomyelitis, she has been started on augmentin, there is increased risk of amputation She is on levemir 20 units at bedtime, but last night took 24 units as her bedtime cbg was 197 This morning it was 119 She checks her cbgs TID Denies any lows, denies any cbgs > 200 She reports her 2 sons have DM type 1 She is wondering about starting novolog Her last a1c 10.3, crt 1.28 gfr 41  She is also concerned about diarrhea 5 days of green watery diarrhea, > 5 stools a day, no blood Diarrhea started before augmentin Has been on several abx due to right foot ulcer Takes probiotics daily Denies any fever, chills, abd pain, nausea or vomiting  Depression screen [facility] 2/9 05/22/2017 04/29/2017 04/21/2017  Decreased Interest 0 3 0  Down, Depressed, Hopeless 0 3 0  PHQ - 2 Score 0 6 0  Altered sleeping - 3 -  Tired, decreased energy - 3 -  Change in appetite - 3 -  Feeling bad or failure about yourself  - 3 -  Trouble concentrating - 3 -  Moving slowly or fidgety/restless - 0 -  Suicidal thoughts - 0 -  PHQ-9 Score - 21 -  Some recent data might be hidden    Allergies  Allergen Reactions  . Valium [Diazepam] Swelling    face    Prior to Admission medications   Medication Sig Start Date End Date Taking? Authorizing Provider  ALPRAZolam Duanne Moron) 1 MG tablet Take 1 mg by mouth at bedtime as needed and may repeat dose one time if needed for anxiety or sleep.   Yes [provider]  amoxicillin-clavulanate (AUGMENTIN) 875-125 MG tablet  Take 1 tablet by mouth 2 (two) times daily. 06/20/17  Yes [name], DPM  aspirin EC 81 MG tablet Take 81 mg by mouth at bedtime.   Yes [provider]  blood glucose meter kit and supplies KIT Dispense based on patient and insurance preference. Use three times a day as directed. Dx. E11.65, Z79.4 06/06/17  Yes Rutherford Guys, MD  carvedilol (COREG) 12.5 MG tablet TAKE 1 AND 1/2 TABLETS TWICE DAILY WITH A MEAL 06/02/17  Yes Deboraha Sprang, MD  citalopram (CELEXA) 40 MG tablet Take 1 tablet by mouth daily. 10/18/16  Yes [provider]  clopidogrel (PLAVIX) 75 MG tablet TAKE 1 TABLET EVERY DAY 10/24/16  Yes Croitoru, Mihai, MD  diclofenac sodium (VOLTAREN) 1 % GEL Apply 2 g topically 4 (four) times daily. 05/22/17  Yes Rutherford Guys, MD  furosemide (LASIX) 40 MG tablet TAKE 2 TABLETS EVERY MORNING  AND TAKE 1 TABLET EVERY EVENING 01/16/17  Yes Lorretta Harp, MD  Insulin Detemir (LEVEMIR) 100 UNIT/ML Pen Inject 20 Units into the skin daily at 10 pm.   Yes [provider]  ipratropium (ATROVENT) 0.03 % nasal spray Place 1 spray into both nostrils 2 (two) times daily. 05/22/17  Yes Rutherford Guys, MD  levothyroxine (SYNTHROID, LEVOTHROID) 112 MCG tablet Take 1  tablet (112 mcg total) by mouth at bedtime. 05/29/17  Yes Rutherford Guys, MD  MELATONIN PO Take 2 tablets by mouth at bedtime.   Yes [provider]  nitroGLYCERIN (NITROSTAT) 0.4 MG SL tablet Place 1 tablet (0.4 mg total) under the tongue every 5 (five) minutes as needed for chest pain. 05/01/13  Yes Brett Canales, PA-C  oxyCODONE-acetaminophen (PERCOCET/ROXICET) 5-325 MG tablet Take 1 tablet by mouth every 6 (six) hours as needed for severe pain.    Yes [provider]  pantoprazole (PROTONIX) 40 MG tablet Take 40 mg by mouth 2 (two) times daily.  04/30/13  Yes [provider]  potassium chloride SA (K-DUR,KLOR-CON) 20 MEQ tablet TAKE 1 TABLET EVERY DAY  (DOSE  INCREASE) 06/20/17   Yes Lorretta Harp, MD  simvastatin (ZOCOR) 20 MG tablet Take 1 tablet (20 mg total) by mouth daily with supper. 06/01/17  Yes Rutherford Guys, MD    Past Medical History:  Diagnosis Date  . Anxiety   . Arthritis   . Chronic pain    a. Prior h/o chronic pain on methadone.  . Chronic systolic CHF (congestive heart failure) (HCC)    a. mixed ischemic/non-ischemic cardiomyopathy. b. s/p CRT-D in 06/2014.  Marland Kitchen CKD (chronic kidney disease), stage III ([place])   . Complication of anesthesia    hard time waking her up from general surgery  . Coronary artery disease    a. remote RCA stenting in 2008 with non-DES. b. Cath 06/2014 following abnormal nuc: Stable, unchanged from prior cath, patent stent and 40% LM.  . Diabetes mellitus ([place])   . GERD (gastroesophageal reflux disease)   . Headache   . Hyperlipidemia   . Hypertension   . LBBB (left bundle branch block)   . Nonischemic cardiomyopathy ([place])   . OSA (obstructive sleep apnea)    AHI-9.77/hr, during REM-50.32/hr  . Tobacco abuse     Past Surgical History:  Procedure Laterality Date  . BACK SURGERY  2013  . BI-VENTRICULAR IMPLANTABLE CARDIOVERTER DEFIBRILLATOR N/A 06/11/2014   STJ CRTD implanted by Dr Caryl Comes  . CARDIAC CATHETERIZATION  12/14/2006   RCA stented with a 3.0 [employer] Liberte stent resulting in a reduction of 75% to 0% residual  . CHOLECYSTECTOMY     20 years ago  . COLONOSCOPY WITH PROPOFOL N/A 12/15/2014   Procedure: COLONOSCOPY WITH PROPOFOL;  Surgeon: Clarene Essex, MD;  Location: [facility] ENDOSCOPY;  Service: Endoscopy;  Laterality: N/A;  . EYE SURGERY     bilateral cataract surgery   . LEFT AND RIGHT HEART CATHETERIZATION WITH CORONARY ANGIOGRAM N/A 05/29/2014   Procedure: LEFT AND RIGHT HEART CATHETERIZATION WITH CORONARY ANGIOGRAM;  Surgeon: Lorretta Harp, MD;  Location[facility] CATH LAB;  Service: Cardiovascular;  Laterality: N/A;  . LEFT HEART CATHETERIZATION WITH CORONARY ANGIOGRAM N/A 12/27/2012   Procedure:  LEFT HEART CATHETERIZATION WITH CORONARY ANGIOGRAM;  Surgeon: Lorretta Harp, MD;  Location: [facility] CATH LAB;  Service: Cardiovascular;  Laterality: N/A;    Social History   Tobacco Use  . Smoking status: Current Every Day Smoker    Packs/day: 2.00    Years: 58.00    Pack years: 116.00    Types: Cigarettes    Start date: 12/26/1952    Last attempt to quit: 12/05/2013    Years since quitting: 3.5  . Smokeless tobacco: Never Used  . Tobacco comment: uses Vape cigarettes  Substance Use Topics  . Alcohol use: No    Alcohol/week: 0.0 oz  Family History  Problem Relation Age of Onset  . Stroke Mother   . Hypertension Mother   . Coronary artery disease Father   . Stroke Brother   . Heart disease Brother   . Other Brother        H1N1 VIRUS  . Healthy Sister   . Healthy Sister     Review of Systems  Constitutional: Negative for chills and fever.  Respiratory: Negative for cough and shortness of breath.   Cardiovascular: Negative for chest pain, palpitations and leg swelling.  Gastrointestinal: Positive for diarrhea. Negative for abdominal pain, blood in stool, melena, nausea and vomiting.  Endo/Heme/Allergies: Negative for polydipsia.     OBJECTIVE:  Blood pressure 128/62, pulse 96, temperature 99.3 F (37.4 C), temperature source Oral, resp. rate 18, height _0  (1.651 m), weight 153 lb 3.2 oz (69.5 kg), SpO2 98 %.  Wt Readings from Last 3 Encounters:  06/22/17 153 lb 3.2 oz (69.5 kg)  05/29/17 160 lb (72.6 kg)  05/22/17 160 lb (72.6 kg)    Physical Exam  Constitutional: She is oriented to person, place, and time. She appears well-developed and well-nourished.  HENT:  Head: Normocephalic and atraumatic.  Mouth/Throat: Oropharynx is clear and moist. No oropharyngeal exudate.  Eyes: Pupils are equal, round, and reactive to light. Conjunctivae and EOM are normal. No scleral icterus.  Neck: Neck supple.  Cardiovascular: Normal rate, regular rhythm and normal heart  sounds. Exam reveals no gallop and no friction rub.  No murmur heard. Pulmonary/Chest: Effort normal and breath sounds normal. She has no wheezes. She has no rales.  Musculoskeletal: She exhibits no edema.  Neurological: She is alert and oriented to person, place, and time.  Skin: Skin is warm and dry.  Nursing note and vitals reviewed.   ASSESSMENT and PLAN  1. Type 2 diabetes mellitus with stage 3 chronic kidney disease, with long-term current use of insulin ([place]) Patient previously well controlled on just levemir, became very uncontrolled with death of her son, reports acceptable cbgs at home however given current complications, will aim for tighter control as long as no hypoglycemia events. Discussed in great details insulin sliding 1/25/150 scale and mgt hypoglycemia. Patient also with long standing h/o DM and 2 sons with type 1, checking c peptide.  - C-peptide - Basic Metabolic Panel 1. NOVOLOG SLIDING SCALE Glucose reading         Units of novolog 120 - 150                            2 151-175                              3 176 - 200                            4 200 - 225                            5   2. Hypothyroidism, unspecified type Levothyroxine dose changed ~ 6 weeks ago. Rechecking TSH today.  - TSH  3. Ulcer of right foot, unspecified ulcer stage [facility]) Per podiatry  4. Diarrhea, unspecified type Discussed BRAT diet, push fluids, cont probiotics, checking for c diff - Clostridium Difficile by PCR; Future   Return in about 1 month (around  07/20/2017).    Rutherford Guys, MD Primary [facility] at [place], [place] [address] Ph.  [phone] Fax [phone]

## 2017-06-22 NOTE — Telephone Encounter (Signed)
LVM to have pt schedule office visit for medication changes.

## 2017-06-22 NOTE — Patient Instructions (Addendum)
1. NOVOLOG SLIDING SCALE Glucose reading         Units of novolog 120 - 150                            2 151-175                              3 176 - 200                            4 200 - 225                            5  IF you received an x-ray today, you will receive an invoice from [facility]. Please contact [facility] at [phone] with questions or concerns regarding your invoice.   IF you received labwork today, you will receive an invoice from [facility]. Please contact [facility] at [phone] with questions or concerns regarding your invoice.   Our billing staff will not be able to assist you with questions regarding bills from these companies.  You will be contacted with the lab results as soon as they are available. The fastest way to get your results is to activate your My Chart account. Instructions are located on the last page of this paperwork. If you have not heard from [place] regarding the results in 2 weeks, please contact this office.     Hypoglycemia Hypoglycemia is when the sugar (glucose) level in the blood is too low. Symptoms of low blood sugar may include:  Feeling: ? Hungry. ? Worried or nervous (anxious). ? Sweaty and clammy. ? Confused. ? Dizzy. ? Sleepy. ? Sick to your stomach (nauseous).  Having: ? A fast heartbeat. ? A headache. ? A change in your vision. ? Jerky movements that you cannot control (seizure). ? Nightmares. ? Tingling or no feeling (numbness) around the mouth, lips, or tongue.  Having trouble with: ? Talking. ? Paying attention (concentrating). ? Moving (coordination). ? Sleeping.  Shaking.  Passing out (fainting).  Getting upset easily (irritability).  Low blood sugar can happen to people who have diabetes and people who do not have diabetes. Low blood sugar can happen quickly, and it can be an emergency. Treating Low Blood Sugar Low blood sugar is often treated by eating or drinking something sugary  right away. If you can think clearly and swallow safely, follow the 15:15 rule:  Take 15 grams of a fast-acting carb (carbohydrate). Some fast-acting carbs are: ? 1 tube of glucose gel. ? 3 sugar tablets (glucose pills). ? 6-8 pieces of hard candy. ? 4 oz (120 mL) of fruit juice. ? 4 oz (120 mL) of regular (not diet) soda.  Check your blood sugar 15 minutes after you take the carb.  If your blood sugar is still at or below 70 mg/dL (3.9 mmol/L), take 15 grams of a carb again.  If your blood sugar does not go above 70 mg/dL (3.9 mmol/L) after 3 tries, get help right away.  After your blood sugar goes back to normal, eat a meal or a snack within 1 hour.  Treating Very Low Blood Sugar If your blood sugar is at or below 54 mg/dL (3 mmol/L), you have very low blood sugar (severe hypoglycemia). This is an emergency. Do not wait to see if  the symptoms will go away. Get medical help right away. Call your local emergency services (911 in the [place] Do not drive yourself to the hospital. If you have very low blood sugar and you cannot eat or drink, you may need a glucagon shot (injection). A family member or friend should learn how to check your blood sugar and how to give you a glucagon shot. Ask your doctor if you need to have a glucagon shot kit at home. Follow these instructions at home: General instructions  Avoid any diets that cause you to not eat enough food. Talk with your doctor before you start any new diet.  Take over-the-counter and prescription medicines only as told by your doctor.  Limit alcohol to no more than 1 drink per day for nonpregnant women and 2 drinks per day for men. One drink equals 12 oz of beer, 5 oz of wine, or 1 oz of hard liquor.  Keep all follow-up visits as told by your doctor. This is important. If You Have Diabetes:   Make sure you know the symptoms of low blood sugar.  Always keep a source of sugar with you, such as: ? Sugar. ? Sugar  tablets. ? Glucose gel. ? Fruit juice. ? Regular soda (not diet soda). ? Milk. ? Hard candy. ? Honey.  Take your medicines as told.  Follow your exercise and meal plan. ? Eat on time. Do not skip meals. ? Follow your sick day plan when you cannot eat or drink normally. Make this plan ahead of time with your doctor.  Check your blood sugar as often as told by your doctor. Always check before and after exercise.  Share your diabetes care plan with: ? Your work or school. ? People you live with.  Check your pee (urine) for ketones: ? When you are sick. ? As told by your doctor.  Carry a card or wear jewelry that says you have diabetes. If You Have Low Blood Sugar From Other Causes:   Check your blood sugar as often as told by your doctor.  Follow instructions from your doctor about what you cannot eat or drink. Contact a doctor if:  You have trouble keeping your blood sugar in your target range.  You have low blood sugar often. Get help right away if:  You still have symptoms after you eat or drink something sugary.  Your blood sugar is at or below 54 mg/dL (3 mmol/L).  You have jerky movements that you cannot control.  You pass out. These symptoms may be an emergency. Do not wait to see if the symptoms will go away. Get medical help right away. Call your local emergency services (911 in the [place] Do not drive yourself to the hospital. This information is not intended to replace advice given to you by your health care provider. Make sure you discuss any questions you have with your health care provider. Document Released: 05/18/2009 Document Revised: 07/30/2015 Document Reviewed: 03/27/2015 Elsevier Interactive Patient Education  [employer].

## 2017-06-22 NOTE — Telephone Encounter (Signed)
I saw her on 3/18 - at that time she was on levemir 20u at bedtime, and was following her low carb diet, and she reported her fasting sugars less than 130 and her daytime sugars to be less than 180, which is acceptable. However if her sugars are going above 200 often then we need to discuss further treatment. This is done via a visit. If not then I will see her at the end of May for routine Diabetic visit. Thanks

## 2017-06-23 LAB — TSH: TSH: 0.186 u[IU]/mL — ABNORMAL LOW (ref 0.450–4.500)

## 2017-06-23 LAB — BASIC METABOLIC PANEL
BUN/Creatinine Ratio: 11 — ABNORMAL LOW (ref 12–28)
BUN: 12 mg/dL (ref 8–27)
CO2: 26 mmol/L (ref 20–29)
Calcium: 10.3 mg/dL (ref 8.7–10.3)
Chloride: 98 mmol/L (ref 96–106)
Creatinine, Ser: 1.12 mg/dL — ABNORMAL HIGH (ref 0.57–1.00)
GFR calc Af Amer: 56 mL/min/{1.73_m2} — ABNORMAL LOW (ref >59)
GFR calc non Af Amer: 48 mL/min/{1.73_m2} — ABNORMAL LOW (ref >59)
Glucose: 119 mg/dL — ABNORMAL HIGH (ref 65–99)
Potassium: 3.2 mmol/L — ABNORMAL LOW (ref 3.5–5.2)
Sodium: 144 mmol/L (ref 134–144)

## 2017-06-23 LAB — C-PEPTIDE: C-Peptide: 4.6 ng/mL — ABNORMAL HIGH (ref 1.1–4.4)

## 2017-06-26 ENCOUNTER — Telehealth: Payer: Self-pay

## 2017-06-26 ENCOUNTER — Telehealth: Payer: Self-pay | Admitting: Family Medicine

## 2017-06-26 ENCOUNTER — Encounter: Payer: Self-pay | Admitting: Family Medicine

## 2017-06-26 MED ORDER — INSULIN ASPART 100 UNIT/ML FLEXPEN: PEN_INJECTOR | SUBCUTANEOUS | 11 refills | Status: DC

## 2017-06-26 MED ORDER — LEVOTHYROXINE SODIUM 100 MCG PO TABS: 100.0000 ug | ORAL_TABLET | Freq: Every day | ORAL | 3 refills | Status: DC

## 2017-06-26 MED ORDER — POTASSIUM CHLORIDE CRYS ER 20 MEQ PO TBCR: 20.0000 meq | EXTENDED_RELEASE_TABLET | Freq: Two times a day (BID) | ORAL | 1 refills | Status: DC

## 2017-06-26 MED ORDER — INSULIN PEN NEEDLE 32G X 6 MM MISC: 11 refills | Status: DC

## 2017-06-26 NOTE — Telephone Encounter (Signed)
I do not see novolog listed. I do see scaled for novolog at bottom of provider notes. Please advise if this is a med the patient should be on.   Copied from [place] [phone]. Topic: General - Other >> Jun 26, 2017  2:41 PM Oneta Rack wrote:  Relation to pt: self Call back number:[phone]  Reason for call:  Patient was under the impression PCP was prescribing novolog extended release, patient would like to speak with nurse directly, please advise >> Jun 26, 2017  2:45 PM Oneta Rack wrote: Relation to pt: self Call back number:[phone]  Reason for call:  Patient was under the impression PCP was prescribing novolog extended release, patient would like to speak with nurse directly, please advise

## 2017-06-26 NOTE — Addendum Note (Signed)
Addended by: Rutherford Guys on: 06/26/2017 05:31 PM   Modules accepted: Orders

## 2017-06-26 NOTE — Telephone Encounter (Signed)
Sorry about that, just sent rx and also pen needles

## 2017-06-26 NOTE — Telephone Encounter (Signed)
Copied from [place] [phone]. Topic: Quick Communication - See Telephone Encounter >> Jun 26, 2017  5:56 PM Rutherford Nail, NT wrote: CRM for notification. See Telephone encounter for: 06/26/17: [place], Pharmacist at [facility], calling in regards to the directions on the insulin aspart (NOVOLOG FLEXPEN) 100 UNIT/ML FlexPen. Pharmacy is confused on the TTD part of the instructions. Please advise. CB#: [phone]  Instructions below:  "Sig: Per insulin sliding scale, max TTD, 20 units."

## 2017-06-27 ENCOUNTER — Telehealth: Payer: Self-pay

## 2017-06-27 NOTE — Telephone Encounter (Signed)
Pt would like to know if she should continue taking the levemir and wants to know at what time she should take it.   Copied from [place] [phone]. Topic: General - Other >> Jun 27, 2017  2:48 PM Carolyn Stare wrote:  Pt said she has been taking the below med before supper and is asking if she is suppose to continue taking it along with the novolog  [phone]

## 2017-06-27 NOTE — Progress Notes (Signed)
Subjective: 77 year old female presents the office today for follow-up evaluation of ulceration, abscess right hallux.  She states that she has been still on the antibiotics.  She was having some diarrhea prior to when she came in to see me and this does continue but is not worsened since on the antibiotics.  She has not follow-up with her primary care physician for this.  She states the pain in the toe is much improved but she occasionally does still get some sharp shooting pain in the right big toe.  She has remained in the surgical shoe.  She denies any changes in the last saw her.  Denies any systemic complaints such as fevers, chills, nausea, vomiting. No acute changes since last appointment, and no other complaints at this time.   Objective: AAO x3, NAD DP/PT pulses palpable bilaterally, CRT less than 3 seconds On the plantar aspect of the right hallux with 2 separate ulcerations.  One appears to be somewhat deeper however is feeling and compared to when I last saw her.  There is also one just distal to this area that is more superficial.  The larger one measures approximately 1.6 x 1.6 x 0.4 cm and does appear to be filling in compared to when I last saw her.  There is no probing to bone or tendon.  There is significantly improved edema and erythema to the toe from our last saw her.  There is no fluctuation or crepitation there is no ascending cellulitis. No pain with calf compression, swelling, warmth, erythema  Assessment: Ulceration, abscess right hallux with improvement  Plan: -All treatment options discussed with the patient including all alternatives, risks, complications.  -Today she presents today for dressing change.  I did irrigate the wound with saline.  Does appear that her swelling and the redness of the toe is much improved and the wound is actually filled in some.  On her continue with saline wet-to-dry dressing changes into the deeper portion of the wound.  If it starts to become  more superficial seconds which shows small amount of antibiotic ointment or Betadine.  Continuing surgical shoe and offloading all times.  Continue antibiotics.  I do recommend her to follow with her primary care physician in regards to diarrhea that she is been having.  This was ongoing prior to starting antibiotics.  It is not worsened since being on the antibiotics. -RTC 1 week or sooner if needed. -Patient encouraged to call the office with any questions, concerns, change in symptoms.   [name] DPM

## 2017-06-27 NOTE — Telephone Encounter (Signed)
Pharmacy advised  

## 2017-06-27 NOTE — Telephone Encounter (Signed)
Pt calling about the novolog and it is going to cost her $141.00 I advised pt to contact [employer] to see what they would cover for her and she stated she would call right now and call back with what would be the cheapest for her.

## 2017-06-27 NOTE — Telephone Encounter (Signed)
Please advise on acronym "TTD"

## 2017-06-27 NOTE — Telephone Encounter (Signed)
Sorry meant to write TDD (total daily dose) thanks

## 2017-06-29 ENCOUNTER — Ambulatory Visit: Payer: Medicare HMO | Admitting: Podiatry

## 2017-06-29 ENCOUNTER — Encounter: Payer: Self-pay | Admitting: Podiatry

## 2017-06-29 DIAGNOSIS — L02619 Cutaneous abscess of unspecified foot: Secondary | ICD-10-CM | POA: Diagnosis not present

## 2017-06-29 DIAGNOSIS — L97519 Non-pressure chronic ulcer of other part of right foot with unspecified severity: Secondary | ICD-10-CM

## 2017-06-29 MED ORDER — AMOXICILLIN-POT CLAVULANATE 875-125 MG PO TABS
1.0000 | ORAL_TABLET | Freq: Two times a day (BID) | ORAL | 0 refills | Status: DC
Start: 2017-06-29 — End: 2017-07-11

## 2017-06-29 NOTE — Telephone Encounter (Signed)
Pt advised.

## 2017-06-29 NOTE — Telephone Encounter (Signed)
Please let her know  that she takes her levemir at bedtime and her novolog 10 minutes before meals. thanks

## 2017-06-30 NOTE — Progress Notes (Signed)
Subjective: 77 year old female presents the office today for follow-up evaluation of ulceration, abscess right hallux.  She states that she is complaining saline on the area daily.  She states the toe is looking significantly better.  She does notices very small amount of drainage, pus coming from the area however it is much improved.  She still on antibiotics.  She is remaining surgical shoe with offloading.  Overall she states her diarrhea is more intermittent but she was having.  Again this was ongoing prior to starting antibiotics or before she came to see me. Denies any systemic complaints such as fevers, chills, nausea, vomiting. No acute changes since last appointment, and no other complaints at this time.   Objective: AAO x3, NAD DP/PT pulses palpable bilaterally, CRT less than 3 seconds Overall the right hallux appears to be significantly improved compared to what it was.  There is significantly improved edema and erythema to the toe.  There is a small ulceration which does continue plantar aspect of the toe measuring 0.2 x 0.2 x 0.6 cm.  There is no probing to bone although it is close.  There is no significant erythema or ascending cellulitis.  There is no pain to the toe there is no fluctuation or crepitation or malodor.  There was a very small amount of purulence identified upon the initial evaluation of the wound.  Have started the callus over once I debrided it is able to clean it no further drainage or purulence is expressed. No pain with calf compression, swelling, warmth, erythema        Assessment: Ulceration, abscess right hallux with significant improvement  Plan: -All treatment options discussed with the patient including all alternatives, risks, complications.  -I sharply debrided the hyperkeratotic tissue to the wound today without any complications with a #294 blade scalpel..  Minimal amount of purulence identified calculus is much improved.  I cleaned the area with  saline.  We will do Betadine gel to the area daily.  Did give her some of this today.  Continue daily dressing changes as we discussed. -Continue antibiotics and refill this for her today. -Continue surgical shoe with offloading at all times. -Monitor for any clinical signs or symptoms of infection and directed to call the office immediately should any occur or go to the ER. -RTC 1 week   *X-ray right foot next appointment  [name] DPM

## 2017-07-03 ENCOUNTER — Ambulatory Visit: Payer: Medicare HMO | Admitting: Podiatry

## 2017-07-04 ENCOUNTER — Other Ambulatory Visit: Payer: Self-pay | Admitting: Family Medicine

## 2017-07-04 NOTE — Telephone Encounter (Signed)
Pt. Requesting a refill on One Touch Ultra Test Strips; has not been ordered by Dr. Pamella Pert yet.   Last office visit: 06/22/17 PCP: Dr. Pamella Pert Pharmacy: [facility], [address] [place], [place]    Rx is pended.

## 2017-07-04 NOTE — Telephone Encounter (Signed)
Copied from [place] [id number]. Topic: Quick Communication - Rx Refill/Question >> Jul 04, 2017  8:24 AM Boyd Kerbs wrote:  Medication:  ONE TOUCH ULTRA TEST test strip Pt. Is checking her blood sugar more to see how her ration is doing.  She did not know if could get more, she only has a couple left.  Has the patient contacted their pharmacy? No. (Agent: If no, request that the patient contact the pharmacy for the refill.) Preferred Pharmacy (with phone number or street name):   [facility], [address] [place] [address] Phone: [phone] Fax: [phone]  Agent: Please be advised that RX refills may take up to 3 business days. We ask that you follow-up with your pharmacy.

## 2017-07-06 ENCOUNTER — Encounter: Payer: Self-pay | Admitting: Podiatry

## 2017-07-06 ENCOUNTER — Ambulatory Visit (INDEPENDENT_AMBULATORY_CARE_PROVIDER_SITE_OTHER): Payer: Medicare HMO

## 2017-07-06 ENCOUNTER — Ambulatory Visit: Payer: Medicare HMO | Admitting: Podiatry

## 2017-07-06 DIAGNOSIS — L97519 Non-pressure chronic ulcer of other part of right foot with unspecified severity: Secondary | ICD-10-CM | POA: Diagnosis not present

## 2017-07-06 DIAGNOSIS — R69 Illness, unspecified: Secondary | ICD-10-CM | POA: Diagnosis not present

## 2017-07-07 ENCOUNTER — Telehealth: Payer: Self-pay | Admitting: *Deleted

## 2017-07-07 NOTE — Progress Notes (Signed)
No ICM remote transmission received for 06/29/2017 and next ICM transmission scheduled for 07/17/2017.

## 2017-07-07 NOTE — Telephone Encounter (Signed)
Pt advised that she has refills remaining at the pharmacy for glucose test strips. Pt stated that she had called the pharmacy and they told her and she will go and pick them up.

## 2017-07-10 NOTE — Progress Notes (Signed)
Subjective: 77 year old female presents the office today for follow-up evaluation of ulceration, abscess right hallux.  She states there is doing better has callused over.  She has not had any drainage or pus coming from the area and overall she thinks she is doing much better.  She is continue the surgical shoe and offloading.  She is been putting Betadine gel on the area daily.  She is on antibiotics. Denies any systemic complaints such as fevers, chills, nausea, vomiting. No acute changes since last appointment, and no other complaints at this time.   Objective: AAO x3, NAD DP/PT pulses palpable bilaterally, CRT less than 3 seconds Overall the swelling and erythema is much improved to the right hallux and there is minimal today.  Hyperkeratotic tissue on the plantar aspect of the toe and upon debridement continues to be an ulceration measuring 0.2 x 0.2 x 0.5 cm.  There is no probing to bone, undermining or tunneling.  There is no significant drainage coming from the area today there is no fluctuation or crepitation or any malodor.  No other open lesions or pre-ulcerative lesions. No pain with calf compression, swelling, warmth, erythema        Assessment: Ulceration, abscess right hallux with improvement  Plan: -All treatment options discussed with the patient including all alternatives, risks, complications.  -X-rays were obtained and reviewed.  No definitive evidence of acute osteomyelitis identified there is no soft tissue emphysema. -I sharply debrided the hyperkeratotic tissue to the wound today without any complications with a #539 blade scalpel.  There is no significant drainage coming from the area today.  Continue with daily dressing changes and we will do Iodosorb dressing.  Finish course of antibiotics.  Continuing surgical shoe and offloading. -Monitor for any clinical signs or symptoms of infection and directed to call the office immediately should any occur or go to the  ER. -RTC 1 week   [name] DPM

## 2017-07-11 ENCOUNTER — Ambulatory Visit (INDEPENDENT_AMBULATORY_CARE_PROVIDER_SITE_OTHER): Payer: Medicare HMO | Admitting: Podiatry

## 2017-07-11 DIAGNOSIS — L02619 Cutaneous abscess of unspecified foot: Secondary | ICD-10-CM

## 2017-07-11 DIAGNOSIS — L97519 Non-pressure chronic ulcer of other part of right foot with unspecified severity: Secondary | ICD-10-CM | POA: Diagnosis not present

## 2017-07-11 MED ORDER — AMOXICILLIN-POT CLAVULANATE 875-125 MG PO TABS: 1.0000 | ORAL_TABLET | Freq: Two times a day (BID) | ORAL | 0 refills | Status: DC

## 2017-07-16 NOTE — Progress Notes (Signed)
Subjective: 77 year old female presents the office today for follow-up evaluation of ulceration, abscess right hallux.  Is concerned that she noticed discoloration to the toe and she has been putting Iodosorb on the toe which she thinks is making it worse.  She wants to go back to Betadine gel that she was using and she thought that that helped.  More.  She denies any pus coming from the area  Denies any systemic complaints such as fevers, chills, nausea, vomiting. No acute changes since last appointment, and no other complaints at this time.   Objective: AAO x3, NAD DP/PT pulses palpable bilaterally, CRT less than 3 seconds After cleaning the area to debride some of hyperkeratotic tissue that was on the toe today.  The wound measures about the same size of 0.2 x 0.2 x 0.5 cm.  Upon debridement there was one very minimal drop of pus however there is no further drainage identified.  Overall there is no increase in warmth to the toe.  There is no significant edema and erythema appears to be improved as well.  There is no fluctuation or crepitation.  There is no malodor.  No pain with calf compression, swelling, warmth, erythema  Assessment: Ulceration, abscess right hallux  Plan: -All treatment options discussed with the patient including all alternatives, risks, complications.  -I did debride the wound today and I cleaned it out.  I also irrigated the wound with saline.  Overall the wound is about the same as what it was I last saw her.  We will switch back to Betadine gel as this was doing better for her.  Continue Augmentin.  Continue surgical shoe and offloading all times.  Continue to elevate and limit activity as much as possible. -Monitor for any clinical signs or symptoms of infection and directed to call the office immediately should any occur or go to the ER. -RTC 1 week or sooner if needed  [name] DPM

## 2017-07-17 ENCOUNTER — Telehealth: Payer: Self-pay | Admitting: Cardiology

## 2017-07-17 ENCOUNTER — Ambulatory Visit (INDEPENDENT_AMBULATORY_CARE_PROVIDER_SITE_OTHER): Payer: Medicare HMO | Admitting: *Deleted

## 2017-07-17 DIAGNOSIS — I5022 Chronic systolic (congestive) heart failure: Secondary | ICD-10-CM | POA: Diagnosis not present

## 2017-07-17 DIAGNOSIS — Z95810 Presence of automatic (implantable) cardiac defibrillator: Secondary | ICD-10-CM | POA: Diagnosis not present

## 2017-07-17 DIAGNOSIS — I428 Other cardiomyopathies: Secondary | ICD-10-CM

## 2017-07-17 NOTE — Telephone Encounter (Signed)
Spoke with pt and reminded pt of remote transmission that is due today. Pt verbalized understanding.   

## 2017-07-18 ENCOUNTER — Ambulatory Visit: Payer: Medicare HMO | Admitting: Podiatry

## 2017-07-18 NOTE — Progress Notes (Signed)
EPIC Encounter for ICM Monitoring  Patient Name: [name] is a 77 y.o. female Date: 07/18/2017 Primary Care Physican: Rutherford Guys, MD Primary Cardiologist:Berry Electrophysiologist: Caryl Comes Dry Weight: 150 lbs Bi-V Pacing: >99%      Heart Failure questions reviewed, pt asymptomatic.   Thoracic impedance normal but was abnormal suggesting fluid accumulation from 06/28/2017 - 07/08/2017 and 07/13/2017 to 07/16/2017.  Prescribed dosage: Furosemide 40 mg 2 tablets (80 mg total) every AMand 1 tablet (40 mg total) every PM. Potassium 20 mEq 1 tablet daily.  Labs: 04/21/2017 Creatinine 1.28, BUN 15, Potassium 4.6, Sodium 140, EGFR 41-47 09/19/2016 Creatinine 1.39, BUN 36, Potassium 4.1, Sodium 125, EGFR 37-45 Care Everywhere results 09/13/2016 Creatinine 1.51, BUN 30, Potassium 4.6, Sodium 128, EGFR 34-41 Care Everywhere results 08/31/2016 Creatinine 1.40, BUN 16, Potassium 4.3, Sodium 137, EGFR 36-42 06/09/2016 Creatinine 1.51, BUN 23, Potassium 4.5, Sodium 135 Care Everywhere results 06/01/2016 Creatinine 1.07, BUN 5, Potassium 3.7, Sodium 143, EGFR 50->60 Care Everywhere results 05/31/2016 Creatinine 1.13, BUN 8, Potassium 3.4, Sodium 145, EGFR 47-57 Care Everywhere results 05/30/2016 Creatinine 1.25, BUN 12, Potassium 3.6, Sodium 137, EGFR 42-51 Care Everywhere results 05/05/2016 Creatinine 1.45, BUN 21, Potassium 4.5, Sodium 135 Care Everywhere results 05/26/2015 Creatinine 1.36, BUN 17, Potassium 4.5, Sodium 140  Recommendations: No changes.  Reinforced fluid restriction to < 2 L daily and sodium restriction to less than 2000 mg daily.  Encouraged to call for fluid symptoms.  Follow-up plan: ICM clinic phone appointment on 08/17/2017.    Copy of ICM check sent to Dr. Caryl Comes.   3 month ICM trend: 07/18/2017    1 Year ICM trend:       [name], RN 07/18/2017 2:24 PM

## 2017-07-18 NOTE — Progress Notes (Signed)
Remote ICD transmission.   

## 2017-07-19 ENCOUNTER — Encounter: Payer: Self-pay | Admitting: Cardiology

## 2017-07-19 LAB — CUP PACEART REMOTE DEVICE CHECK
Battery Remaining Longevity: 50 mo
Battery Remaining Percentage: 55 %
Battery Voltage: 2.93 V
Brady Statistic AP VP Percent: 3.6 %
Brady Statistic AP VS Percent: 0 %
Brady Statistic AS VP Percent: 96 %
Brady Statistic AS VS Percent: 1 %
Brady Statistic RA Percent Paced: 3.6 %
Date Time Interrogation Session: 20190513204259
HighPow Impedance: 68 Ohm
HighPow Impedance: 68 Ohm
Implantable Lead Implant Date: 20160406
Implantable Lead Implant Date: 20160406
Implantable Lead Implant Date: 20160406
Implantable Lead Location: 753858
Implantable Lead Location: 753859
Implantable Lead Location: 753860
Implantable Lead Model: 7122
Implantable Pulse Generator Implant Date: 20160406
Lead Channel Impedance Value: 1275 Ohm
Lead Channel Impedance Value: 380 Ohm
Lead Channel Impedance Value: 660 Ohm
Lead Channel Pacing Threshold Amplitude: 0.625 V
Lead Channel Pacing Threshold Amplitude: 0.875 V
Lead Channel Pacing Threshold Amplitude: 1 V
Lead Channel Pacing Threshold Pulse Width: 0.5 ms
Lead Channel Pacing Threshold Pulse Width: 0.5 ms
Lead Channel Pacing Threshold Pulse Width: 0.5 ms
Lead Channel Sensing Intrinsic Amplitude: 12 mV
Lead Channel Sensing Intrinsic Amplitude: 5 mV
Lead Channel Setting Pacing Amplitude: 1.875
Lead Channel Setting Pacing Amplitude: 2 V
Lead Channel Setting Pacing Amplitude: 2 V
Lead Channel Setting Pacing Pulse Width: 0.5 ms
Lead Channel Setting Pacing Pulse Width: 0.5 ms
Lead Channel Setting Sensing Sensitivity: 0.5 mV
Pulse Gen Serial Number: 7199559

## 2017-07-21 ENCOUNTER — Ambulatory Visit: Payer: Medicare HMO | Admitting: Podiatry

## 2017-07-21 ENCOUNTER — Ambulatory Visit (INDEPENDENT_AMBULATORY_CARE_PROVIDER_SITE_OTHER): Payer: Medicare HMO

## 2017-07-21 ENCOUNTER — Encounter

## 2017-07-21 ENCOUNTER — Encounter: Payer: Self-pay | Admitting: Podiatry

## 2017-07-21 ENCOUNTER — Other Ambulatory Visit: Payer: Self-pay

## 2017-07-21 ENCOUNTER — Ambulatory Visit (INDEPENDENT_AMBULATORY_CARE_PROVIDER_SITE_OTHER): Payer: Medicare HMO | Admitting: Family Medicine

## 2017-07-21 ENCOUNTER — Encounter: Payer: Self-pay | Admitting: Family Medicine

## 2017-07-21 VITALS — BP 100/60 | HR 91 | Temp 97.8°F | Resp 18 | Ht 65.0 in | Wt 148.8 lb

## 2017-07-21 DIAGNOSIS — R197 Diarrhea, unspecified: Secondary | ICD-10-CM | POA: Diagnosis not present

## 2017-07-21 DIAGNOSIS — R634 Abnormal weight loss: Secondary | ICD-10-CM | POA: Diagnosis not present

## 2017-07-21 DIAGNOSIS — L97519 Non-pressure chronic ulcer of other part of right foot with unspecified severity: Secondary | ICD-10-CM | POA: Diagnosis not present

## 2017-07-21 DIAGNOSIS — N183 Chronic kidney disease, stage 3 unspecified: Secondary | ICD-10-CM

## 2017-07-21 DIAGNOSIS — R69 Illness, unspecified: Secondary | ICD-10-CM | POA: Diagnosis not present

## 2017-07-21 DIAGNOSIS — M86171 Other acute osteomyelitis, right ankle and foot: Secondary | ICD-10-CM

## 2017-07-21 DIAGNOSIS — M546 Pain in thoracic spine: Secondary | ICD-10-CM

## 2017-07-21 DIAGNOSIS — E039 Hypothyroidism, unspecified: Secondary | ICD-10-CM | POA: Diagnosis not present

## 2017-07-21 DIAGNOSIS — E1122 Type 2 diabetes mellitus with diabetic chronic kidney disease: Secondary | ICD-10-CM

## 2017-07-21 DIAGNOSIS — Z794 Long term (current) use of insulin: Secondary | ICD-10-CM

## 2017-07-21 DIAGNOSIS — F4323 Adjustment disorder with mixed anxiety and depressed mood: Secondary | ICD-10-CM | POA: Diagnosis not present

## 2017-07-21 LAB — POCT GLYCOSYLATED HEMOGLOBIN (HGB A1C): Hemoglobin A1C: 6.4

## 2017-07-21 MED ORDER — AMOXICILLIN-POT CLAVULANATE 875-125 MG PO TABS: 1.0000 | ORAL_TABLET | Freq: Two times a day (BID) | ORAL | 0 refills | Status: DC

## 2017-07-21 MED ORDER — MUPIROCIN 2 % EX OINT: 1.0000 "application " | TOPICAL_OINTMENT | Freq: Two times a day (BID) | CUTANEOUS | 2 refills | Status: DC

## 2017-07-21 MED ORDER — MIRTAZAPINE 15 MG PO TABS: 15.0000 mg | ORAL_TABLET | Freq: Every day | ORAL | 3 refills | Status: DC

## 2017-07-21 NOTE — Progress Notes (Signed)
5/17/20192:16 PM  [name] Jul 31, 1940, 77 y.o. female [id number]  Chief Complaint  Patient presents with  . Follow-up    diabetes     HPI:   Patient is a 77 y.o. female with past medical history significant for DM2, HTN, CAD, CHF, depression with anxiety, HLP, GERD, hypothyroidism, chronic pain 2/2 lumbar DDD on opiates who presents today for followup  Patient has ben followed closely by podiatry for a RIGHT big toe diabetic ulcer On augmentin x 6 weeks, has been on several abx prior Ulcer slowly healing  She reports sign decrease in appetite so has backed down on insulin Using novolog 3 units if eating carb heavy meal Using levemir only if bedtime cbg > 120 She has not been taking glipizide Continues to check cbgs TID, reports 90-145, this am 87 Denies any lows, sweats, shakiness  She reports in general not feeling well, reports general sense of malaisae Denies any fever, chills, CP, palpitations, cough, SOB, edema, orthopnea, nausea, vomiting, abd pain, dysuria or hematuria, worsening GERD She does have diarrhea, reports loose stools for past 3 months She denies any melena or hematochezia She reports worsening back pain, but denies any joint swelling She reports h/o rectal sphincter weakness, right side Last colonoscopy in 2016, has h/o polyps GI Dr Watt Climes  She also has not been taking citalopram, she became fearful of being emotionally blunted.  Reports doing ok, has good and not so good days regarding grief from death of her son Continues to take xanax at bedtime for insomnia and anxiety She denies any SI. She overall feels in a better place, being able to take care for herself She however also reports being worried about her toe and ulcer slowly healing, risk of amputation  Continues to take percocet rx by ortho for chronic lumbar back pain However she reports that of recent she has also been having thoracic back pain She has had some mechanical falls of recent,  mostly in relation to foot ulcer affecting her gait, denies any back injuries, denies any worsening numbness, tingling. Denies any focal weakness.  Fall Risk  07/21/2017 06/22/2017 05/22/2017 04/29/2017 04/21/2017  Falls in the past year? Yes Yes No Yes No  Number falls in past yr: 1 2 or more - 2 or more -  Injury with Fall? No No - Yes -  Risk for fall due to : - - - History of fall(s) -  Follow up - - - Falls prevention discussed;Education provided -     Depression screen [facility] 2/9 05/22/2017 04/29/2017 04/21/2017  Decreased Interest 0 3 0  Down, Depressed, Hopeless 0 3 0  PHQ - 2 Score 0 6 0  Altered sleeping - 3 -  Tired, decreased energy - 3 -  Change in appetite - 3 -  Feeling bad or failure about yourself  - 3 -  Trouble concentrating - 3 -  Moving slowly or fidgety/restless - 0 -  Suicidal thoughts - 0 -  PHQ-9 Score - 21 -  Some recent data might be hidden    Allergies  Allergen Reactions  . Valium [Diazepam] Swelling    face    Prior to Admission medications   Medication Sig Start Date End Date Taking? Authorizing Provider  ALPRAZolam Duanne Moron) 1 MG tablet Take 1 mg by mouth at bedtime as needed and may repeat dose one time if needed for anxiety or sleep.   Yes [provider]  amoxicillin-clavulanate (AUGMENTIN) 875-125 MG tablet Take 1 tablet  by mouth 2 (two) times daily. 07/21/17  Yes [name], DPM  aspirin EC 81 MG tablet Take 81 mg by mouth at bedtime.   Yes [provider]  blood glucose meter kit and supplies KIT Dispense based on patient and insurance preference. Use three times a day as directed. Dx. E11.65, Z79.4 06/06/17  Yes Rutherford Guys, MD  carvedilol (COREG) 12.5 MG tablet TAKE 1 AND 1/2 TABLETS TWICE DAILY WITH A MEAL 06/02/17  Yes Deboraha Sprang, MD  clopidogrel (PLAVIX) 75 MG tablet TAKE 1 TABLET EVERY DAY 10/24/16  Yes Croitoru, Mihai, MD  diclofenac sodium (VOLTAREN) 1 % GEL Apply 2 g topically 4 (four) times daily. 05/22/17  Yes  Rutherford Guys, MD  furosemide (LASIX) 40 MG tablet TAKE 2 TABLETS EVERY MORNING  AND TAKE 1 TABLET EVERY EVENING 01/16/17  Yes Lorretta Harp, MD  insulin aspart (NOVOLOG FLEXPEN) 100 UNIT/ML FlexPen Per insulin sliding scale, max TTD, 20 units. 06/26/17  Yes Rutherford Guys, MD  Insulin Detemir (LEVEMIR) 100 UNIT/ML Pen Inject 20 Units into the skin daily at 10 pm.   Yes [provider]  Insulin Pen Needle 32G X 6 MM MISC Use new needle for each injection of insulin, four times a day. Dx E11.9, Z79.4 06/26/17  Yes Rutherford Guys, MD  ipratropium (ATROVENT) 0.03 % nasal spray Place 1 spray into both nostrils 2 (two) times daily. 05/22/17  Yes Rutherford Guys, MD  levothyroxine (SYNTHROID, LEVOTHROID) 100 MCG tablet Take 1 tablet (100 mcg total) by mouth at bedtime. 06/26/17  Yes Rutherford Guys, MD  lisinopril (PRINIVIL,ZESTRIL) 2.5 MG tablet Take by mouth. 03/25/16  Yes [provider]  MELATONIN PO Take 2 tablets by mouth at bedtime.   Yes [provider]  mupirocin ointment (BACTROBAN) 2 % Apply 1 application topically 2 (two) times daily. 07/21/17  Yes [name], DPM  nitroGLYCERIN (NITROSTAT) 0.4 MG SL tablet Place 1 tablet (0.4 mg total) under the tongue every 5 (five) minutes as needed for chest pain. 05/01/13  Yes Brett Canales, PA-C  ONE TOUCH ULTRA TEST test strip  06/07/17  Yes [provider]  Jonetta Speak LANCETS 67R [place]  06/07/17  Yes [provider]  oxyCODONE-acetaminophen (PERCOCET/ROXICET) 5-325 MG tablet Take 1 tablet by mouth every 6 (six) hours as needed for severe pain.    Yes [provider]  pantoprazole (PROTONIX) 40 MG tablet Take 40 mg by mouth 2 (two) times daily.  04/30/13  Yes [provider]  potassium chloride SA (K-DUR,KLOR-CON) 20 MEQ tablet Take 1 tablet (20 mEq total) by mouth 2 (two) times daily. 06/26/17  Yes Rutherford Guys, MD  simvastatin (ZOCOR) 20 MG tablet Take 1 tablet (20 mg  total) by mouth daily with supper. 06/01/17  Yes Rutherford Guys, MD    Past Medical History:  Diagnosis Date  . Anxiety   . Arthritis   . Chronic pain    a. Prior h/o chronic pain on methadone.  . Chronic systolic CHF (congestive heart failure) (HCC)    a. mixed ischemic/non-ischemic cardiomyopathy. b. s/p CRT-D in 06/2014.  Marland Kitchen CKD (chronic kidney disease), stage III ([place])   . Complication of anesthesia    hard time waking her up from general surgery  . Coronary artery disease    a. remote RCA stenting in 2008 with non-DES. b. Cath 06/2014 following abnormal nuc: Stable, unchanged from prior cath, patent stent and 40% LM.  Marland Kitchen  Diabetes mellitus ([place])   . GERD (gastroesophageal reflux disease)   . Headache   . Hyperlipidemia   . Hypertension   . LBBB (left bundle branch block)   . Nonischemic cardiomyopathy ([place])   . OSA (obstructive sleep apnea)    AHI-9.77/hr, during REM-50.32/hr  . Tobacco abuse     Past Surgical History:  Procedure Laterality Date  . BACK SURGERY  2013  . BI-VENTRICULAR IMPLANTABLE CARDIOVERTER DEFIBRILLATOR N/A 06/11/2014   STJ CRTD implanted by Dr Caryl Comes  . CARDIAC CATHETERIZATION  12/14/2006   RCA stented with a 3.0 [employer] Liberte stent resulting in a reduction of 75% to 0% residual  . CHOLECYSTECTOMY     20 years ago  . COLONOSCOPY WITH PROPOFOL N/A 12/15/2014   Procedure: COLONOSCOPY WITH PROPOFOL;  Surgeon: Clarene Essex, MD;  Location: [facility] ENDOSCOPY;  Service: Endoscopy;  Laterality: N/A;  . EYE SURGERY     bilateral cataract surgery   . LEFT AND RIGHT HEART CATHETERIZATION WITH CORONARY ANGIOGRAM N/A 05/29/2014   Procedure: LEFT AND RIGHT HEART CATHETERIZATION WITH CORONARY ANGIOGRAM;  Surgeon: Lorretta Harp, MD;  Location: [facility] CATH LAB;  Service: Cardiovascular;  Laterality: N/A;  . LEFT HEART CATHETERIZATION WITH CORONARY ANGIOGRAM N/A 12/27/2012   Procedure: LEFT HEART CATHETERIZATION WITH CORONARY ANGIOGRAM;  Surgeon: Lorretta Harp, MD;   Location: [facility] C[facility] LAB;  Service: Cardiovascular;  Laterality: N/A;    Social History   Tobacco Use  . Smoking status: Current Every Day Smoker    Packs/day: 2.00    Years: 58.00    Pack years: 116.00    Types: Cigarettes    Start date: 12/26/1952    Last attempt to quit: 12/05/2013    Years since quitting: 3.6  . Smokeless tobacco: Never Used  . Tobacco comment: uses Vape cigarettes  Substance Use Topics  . Alcohol use: No    Alcohol/week: 0.0 oz    Family History  Problem Relation Age of Onset  . Stroke Mother   . Hypertension Mother   . Coronary artery disease Father   . Stroke Brother   . Heart disease Brother   . Other Brother        H1N1 VIRUS  . Healthy Sister   . Healthy Sister     ROS Per hpi  OBJECTIVE:  Blood pressure 100/60, pulse 91, temperature 97.8 F (36.6 C), temperature source Oral, resp. rate 18, height _0  (1.651 m), weight 148 lb 12.8 oz (67.5 kg), SpO2 98 %.  Wt Readings from Last 3 Encounters:  07/21/17 148 lb 12.8 oz (67.5 kg)  06/22/17 153 lb 3.2 oz (69.5 kg)  05/29/17 160 lb (72.6 kg)    Physical Exam  Constitutional: She is oriented to person, place, and time.  Chronically ill appeared, NAD  HENT:  Head: Normocephalic and atraumatic.  Right Ear: Hearing, tympanic membrane, external ear and ear canal normal.  Left Ear: Hearing, tympanic membrane, external ear and ear canal normal.  Mouth/Throat: Oropharynx is clear and moist.  Eyes: Pupils are equal, round, and reactive to light. EOM are normal.  Neck: Neck supple. No thyromegaly present.  Cardiovascular: Normal rate, regular rhythm and normal heart sounds. Exam reveals no gallop and no friction rub.  No murmur heard. Pulmonary/Chest: Effort normal and breath sounds normal. She has no wheezes. She has no rales.  Abdominal: Soft. Bowel sounds are normal. She exhibits no distension and no mass. There is no tenderness.  Musculoskeletal: Normal range of motion. She exhibits no edema.  Thoracic back: She exhibits bony tenderness. She exhibits normal range of motion.  Lymphadenopathy:    She has no cervical adenopathy.  Neurological: She is alert and oriented to person, place, and time. She has normal reflexes.  Skin: Skin is warm and dry.  Nursing note and vitals reviewed.    Results for orders placed or performed in visit on 07/21/17 (from the past 24 hour(s))  POCT glycosylated hemoglobin (Hb A1C)     Status: None   Collection Time: 07/21/17  2:38 PM  Result Value Ref Range   Hemoglobin A1C 6.4     Dg Chest 2 View  Result Date: 07/21/2017 CLINICAL DATA:  Weight loss smoker.  History of laryngeal cancer EXAM: CHEST - 2 VIEW COMPARISON:  05/30/2016 FINDINGS: AICD unchanged in position. Heart size normal. Negative for heart failure. Lungs are clear without infiltrate effusion or mass. No lung nodules. IMPRESSION: No active cardiopulmonary disease. Electronically Signed   By: Franchot Gallo M.D.   On: 07/21/2017 15:25   Dg Thoracic Spine 2 View  Result Date: 07/21/2017 CLINICAL DATA:  77 year old female with midline thoracic back pain for 2 weeks. EXAM: THORACIC SPINE 2 VIEWS COMPARISON:  Chest radiographs 12/13/2015. FINDINGS: There appear to be absent ribs at T12. Stable thoracic vertebral height and alignment since 2017 with relatively preserved disc spaces. Intermittent mild degenerative endplate spurring. Partially visible prior cervical ACDF. Chronic left chest cardiac AICD. Mediastinal contours remain within normal limits. Negative visible lung parenchyma and bowel gas pattern. Stable cholecystectomy clips. IMPRESSION: No acute osseous abnormality identified in the thoracic spine. Electronically Signed   By: Genevie Ann M.D.   On: 07/21/2017 15:30     ASSESSMENT and PLAN  1. Type 2 diabetes mellitus with stage 3 chronic kidney disease, with long-term current use of insulin (HCC) Controlled, however concerned how much is driven by her sign decrease in appetite  and unintentional weight loss. Cont with current regime. Discussed ssx and mgt of hypoglycemia. - POCT glycosylated hemoglobin (Hb A1C) - Comprehensive metabolic panel  2. Hypothyroidism, unspecified type Rechecking TSH given weight loss.  - TSH - TSH; Future  3. Ulcer of right foot, unspecified ulcer stage [facility]) Per podiatry  4. Loss of weight Patient with sign decrease of appetite, loose stools, setting of prolonged abx. Referring to GI given h/o colonic polyps but also for eval of gastroparesis given long standing h/o DM. Discussed starting daily glucerna. - Ambulatory referral to Gastroenterology - DG Chest 2 View; Future  5. Midline thoracic back pain, unspecified chronicity No fractures. DDD per xray. - DG Thoracic Spine 2 View; Future  6. Adjustment reaction with anxiety and depression Patient not interested in SSRI. We discussed mirtazapine to help with mood, appetite and sleep. New meds r/se/b reviewed.  7. Diarrhea, unspecified type In setting of prolonged abx use. Most likely 2/2 clavulanate. Reminded patient to please collect stool for c diif test given last visit. Rechecking TSH, referring to GI.   Other orders - mirtazapine (REMERON) 15 MG tablet; Take 1 tablet (15 mg total) by mouth at bedtime. - levothyroxine (SYNTHROID, LEVOTHROID) 88 MCG tablet; Take 1 tablet (88 mcg total) by mouth at bedtime.  Return in about 1 month (around 08/18/2017).    Rutherford Guys, MD [facility] [place], [place] [address] Ph.  [phone] Fax [phone]

## 2017-07-21 NOTE — Progress Notes (Signed)
Subjective: 77 year old female presents the office today for follow-up evaluation of ulceration, abscess right hallux. She still gets some swelling discoloration mostly at nighttime if she is been on her feet.  She is continue with Betadine gel to the wound daily.  She denies any drainage or pus coming from the area.  She still has some swelling to the toe intermittently.  However she denies any increasing redness or any red streaks or any smell to the wound.  She has no other concerns today.  All Denies any systemic complaints such as fevers, chills, nausea, vomiting. No acute changes since last appointment, and no other complaints at this time.   Objective: AAO x3, NAD DP/PT pulses palpable bilaterally, CRT less than 3 seconds After cleaning the area to debride some of hyperkeratotic tissue that was on the toe today.  The wound measures smaller at 0.2 x 0.2 x 0.3 cm.  Upon debridement today there is no underlying ulceration, drainage or any signs of infection.  There is minimal swelling to the hallux there is no significant erythema there is no fluctuation or crepitation or any malodor.  There is no ascending cellulitis.  No other open lesions or pre-ulcerative lesions identified today. No pain with calf compression, swelling, warmth, erythema  Assessment: Ulceration, abscess right hallux with improvement   Plan: -All treatment options discussed with the patient including all alternatives, risks, complications.  -X-rays were obtained and reviewed with the patient.  No definitive cortical changes suggestive of osteomyelitis there is no soft tissue emphysema present. -Today sharply debrided the wound without any complications or bleeding appears to be more superficial today.  Continue with Betadine gel dressing changes daily.  Continue offloading at all times.  We are going to do 2 more weeks of oral antibiotics for total of 6 weeks and treat for an infection of the bone however the cultures have been  negative (except for skinflora) there ill continue with Augmentin as she is responded to this. Will recheck blood work.  -RTC 1 week or sooner if needed  [name] DPM

## 2017-07-21 NOTE — Patient Instructions (Addendum)
1. Please drink 1 glucerna a day 2. Your diabetes is back under control, a1c 6.4.     IF you received an x-ray today, you will receive an invoice from [facility]. Please contact [facility] at [phone] with questions or concerns regarding your invoice.   IF you received labwork today, you will receive an invoice from [facility]. Please contact [facility] at [phone] with questions or concerns regarding your invoice.   Our billing staff will not be able to assist you with questions regarding bills from these companies.  You will be contacted with the lab results as soon as they are available. The fastest way to get your results is to activate your My Chart account. Instructions are located on the last page of this paperwork. If you have not heard from [place] regarding the results in 2 weeks, please contact this office.

## 2017-07-22 LAB — COMPREHENSIVE METABOLIC PANEL
ALT: 10 IU/L (ref 0–32)
AST: 16 IU/L (ref 0–40)
Albumin/Globulin Ratio: 1.8 (ref 1.2–2.2)
Albumin: 4.2 g/dL (ref 3.5–4.8)
Alkaline Phosphatase: 85 IU/L (ref 39–117)
BUN/Creatinine Ratio: 13 (ref 12–28)
BUN: 16 mg/dL (ref 8–27)
Bilirubin Total: 0.4 mg/dL (ref 0.0–1.2)
CO2: 26 mmol/L (ref 20–29)
Calcium: 10 mg/dL (ref 8.7–10.3)
Chloride: 100 mmol/L (ref 96–106)
Creatinine, Ser: 1.26 mg/dL — ABNORMAL HIGH (ref 0.57–1.00)
GFR calc Af Amer: 48 mL/min/{1.73_m2} — ABNORMAL LOW (ref >59)
GFR calc non Af Amer: 42 mL/min/{1.73_m2} — ABNORMAL LOW (ref >59)
Globulin, Total: 2.4 g/dL (ref 1.5–4.5)
Glucose: 159 mg/dL — ABNORMAL HIGH (ref 65–99)
Potassium: 4.2 mmol/L (ref 3.5–5.2)
Sodium: 143 mmol/L (ref 134–144)
Total Protein: 6.6 g/dL (ref 6.0–8.5)

## 2017-07-22 LAB — CBC WITH DIFFERENTIAL/PLATELET
Basophils Absolute: 0 10*3/uL (ref 0.0–0.2)
Basos: 1 %
EOS (ABSOLUTE): 0.2 10*3/uL (ref 0.0–0.4)
Eos: 4 %
Hematocrit: 41.8 % (ref 34.0–46.6)
Hemoglobin: 13.4 g/dL (ref 11.1–15.9)
Immature Grans (Abs): 0 10*3/uL (ref 0.0–0.1)
Immature Granulocytes: 0 %
Lymphocytes Absolute: 1.2 10*3/uL (ref 0.7–3.1)
Lymphs: 27 %
MCH: 29.9 pg (ref 26.6–33.0)
MCHC: 32.1 g/dL (ref 31.5–35.7)
MCV: 93 fL (ref 79–97)
Monocytes Absolute: 0.4 10*3/uL (ref 0.1–0.9)
Monocytes: 10 %
Neutrophils Absolute: 2.5 10*3/uL (ref 1.4–7.0)
Neutrophils: 58 %
Platelets: 201 10*3/uL (ref 150–379)
RBC: 4.48 x10E6/uL (ref 3.77–5.28)
RDW: 14 % (ref 12.3–15.4)
WBC: 4.2 10*3/uL (ref 3.4–10.8)

## 2017-07-22 LAB — TSH: TSH: 0.121 u[IU]/mL — ABNORMAL LOW (ref 0.450–4.500)

## 2017-07-22 LAB — SEDIMENTATION RATE: Sed Rate: 2 mm/hr (ref 0–40)

## 2017-07-22 LAB — C-REACTIVE PROTEIN: CRP: 1.9 mg/L (ref 0.0–4.9)

## 2017-07-24 ENCOUNTER — Telehealth: Payer: Self-pay | Admitting: Family Medicine

## 2017-07-24 MED ORDER — LEVOTHYROXINE SODIUM 88 MCG PO TABS: 88.0000 ug | ORAL_TABLET | Freq: Every day | ORAL | 2 refills | Status: DC

## 2017-07-24 NOTE — Telephone Encounter (Signed)
Copied from [place] [phone]. Topic: Quick Communication - Rx Refill/Question >> Jul 24, 2017  2:40 PM [name] B wrote: Medication: ALPRAZolam Duanne Moron) 1 MG tablet [[id number]]   Has the patient contacted their pharmacy? Yes.   (Agent: If no, request that the patient contact the pharmacy for the refill.) (Agent: If yes, when and what did the pharmacy advise?)  Preferred Pharmacy (with phone number [facility]): [facility]   Agent: Please be advised that RX refills may take up to 3 business days. We ask that you follow-up with your pharmacy.

## 2017-07-25 ENCOUNTER — Telehealth: Payer: Self-pay | Admitting: Family Medicine

## 2017-07-25 NOTE — Telephone Encounter (Signed)
Patient called and given results per notes of Dr. Pamella Pert on 07/24/17, patient verbalized understanding. She says she will call back for her lab appointment later. Unable to document in result note due to result note not being routed to [facility].

## 2017-07-25 NOTE — Telephone Encounter (Signed)
Please advise if pt should be on the medication. Note incomplete from [place].

## 2017-07-25 NOTE — Telephone Encounter (Signed)
Copied from [place] [id number]. Topic: Quick Communication - Lab Results >> Jul 25, 2017  8:51 AM Suszanne Finch, LPN wrote: Hulen Skains patient to inform them of  lab results. When patient returns call, triage nurse may disclose results.   Pt returning call about lab results. CB# [phone] [phone]

## 2017-07-25 NOTE — Telephone Encounter (Signed)
Refill request for Xanax, last filled by historical provider.   LOV: 07/21/17 Dr. Carol Ada on file

## 2017-07-26 MED ORDER — ALPRAZOLAM 1 MG PO TABS: 1.0000 mg | ORAL_TABLET | Freq: Every day | ORAL | 0 refills | Status: DC | PRN

## 2017-07-26 NOTE — Telephone Encounter (Signed)
[place] CSR reviewed Last rx refilled on 05/05/17 for 90 tabs, rx by previous psychiatrist whom she does not see anymore She is on long term opiates from ortho due to DDD She has h/o bzd withdrawal She reports in general less use of bzd Per last rx she avg one tab a day, higher use was closer to march when she was in increased distress Refilled for 30 tabs, no refills, as this will help determine use

## 2017-07-28 ENCOUNTER — Telehealth: Payer: Self-pay | Admitting: *Deleted

## 2017-07-28 ENCOUNTER — Telehealth: Payer: Self-pay | Admitting: Family Medicine

## 2017-07-28 NOTE — Telephone Encounter (Signed)
I informed pt on mobile line of Dr. Leigh [place] review of results.

## 2017-07-28 NOTE — Telephone Encounter (Signed)
Unable to leave a message home phone was busy.

## 2017-07-28 NOTE — Telephone Encounter (Signed)
-----   Message from [name], DPM sent at 07/27/2017  9:06 PM EDT ----- Val- please let her know that her blood work was normal. Thanks.

## 2017-07-28 NOTE — Telephone Encounter (Signed)
Copied from [place] [id number]. Topic: Quick Communication - Lab Results >> Jul 25, 2017  8:53 AM Suszanne Finch, LPN wrote: Hulen Skains patient to inform them of  lab results. When patient returns call, triage nurse may disclose results.  Can also give xray results with lab >> Jul 28, 2017  3:38 PM Marin Olp L wrote: No [facility] RN free for x-ray results.

## 2017-07-28 NOTE — Telephone Encounter (Signed)
Pt given results of xray per notes of Dr. Pamella Pert on 07/24/17. Pt verbalized understanding.Unable to document in result note due to result note not being routed to [facility].

## 2017-08-01 ENCOUNTER — Other Ambulatory Visit: Payer: Self-pay | Admitting: Internal Medicine

## 2017-08-01 DIAGNOSIS — I2510 Atherosclerotic heart disease of native coronary artery without angina pectoris: Secondary | ICD-10-CM

## 2017-08-01 MED ORDER — CLOPIDOGREL BISULFATE 75 MG PO TABS: 75.0000 mg | ORAL_TABLET | Freq: Every day | ORAL | 1 refills | Status: DC

## 2017-08-01 MED ORDER — FUROSEMIDE 40 MG PO TABS: ORAL_TABLET | ORAL | 1 refills | Status: DC

## 2017-08-01 NOTE — Telephone Encounter (Signed)
Rx request sent to pharmacy.  

## 2017-08-01 NOTE — Telephone Encounter (Signed)
New message     *STAT* If patient is at the pharmacy, call can be transferred to refill team.   1. Which medications need to be refilled? (please list name of each medication and dose if known)   clopidogrel (PLAVIX) 75 MG tablet TAKE 1 TABLET EVERY DAY      furosemide (LASIX) 40 MG tablet TAKE 2 TABLETS EVERY MORNING AND TAKE 1 TABLET EVERY EVENING        2. Which pharmacy/location (including street and city if local pharmacy) is medication to be sent to? [facility] - [address]   3. Do they need a 30 day or 90 day supply? [place]

## 2017-08-01 NOTE — Telephone Encounter (Signed)
This is Dr. Berry's pt 

## 2017-08-02 ENCOUNTER — Encounter: Payer: Self-pay | Admitting: Family Medicine

## 2017-08-02 ENCOUNTER — Telehealth: Payer: Self-pay | Admitting: Family Medicine

## 2017-08-02 NOTE — Telephone Encounter (Signed)
Copied from [place] [phone]. Topic: Quick Communication - Rx Refill/Question >> Aug 02, 2017  3:47 PM Margot Ables wrote: Reason for CRM: calling to notify Dr. Pamella Pert that pt is getting xanax from [place] and oxycodone from Dr. Kristeen Miss - please call to advise if ok to dispense xanax    [place] states they have been trying to contact the office since 5/22-5/23 and have not received a response.   [place], [place] [phone] (Phone) [phone] (Fax)

## 2017-08-03 ENCOUNTER — Ambulatory Visit: Payer: Medicare HMO | Admitting: Podiatry

## 2017-08-03 ENCOUNTER — Encounter: Payer: Self-pay | Admitting: Podiatry

## 2017-08-03 DIAGNOSIS — L97519 Non-pressure chronic ulcer of other part of right foot with unspecified severity: Secondary | ICD-10-CM

## 2017-08-03 DIAGNOSIS — M86171 Other acute osteomyelitis, right ankle and foot: Secondary | ICD-10-CM | POA: Diagnosis not present

## 2017-08-03 MED ORDER — KETOCONAZOLE 2 % EX CREA: 1.0000 "application " | TOPICAL_CREAM | Freq: Every day | CUTANEOUS | 2 refills | Status: DC

## 2017-08-04 DIAGNOSIS — R69 Illness, unspecified: Secondary | ICD-10-CM | POA: Diagnosis not present

## 2017-08-05 NOTE — Telephone Encounter (Signed)
Please call pharmacy and inform then that I am aware, I do review the [place] controlled substance registry. I have discussed with patient extensively. Ok to dispense. Thanks

## 2017-08-06 NOTE — Progress Notes (Signed)
Subjective: 77 year old female presents the office today for follow-up evaluation of ulceration, abscess right hallux.  She states that overall she is doing much better.  She has not had any drainage or pus coming from the area of the wound and she is been keeping the wound dressed.  She states that the color changes that she was getting to the big toe is much better and she feels that part of this was discussed she was put in the surgical shoe on too tight.  She does not with a surgical shoe all the time she does go without it around the house but she walks more in her heel when she does this.  She also states that she has some athlete's foot on the left foot is been ongoing for some time.  Denies any open sores or any drainage. Denies any systemic complaints such as fevers, chills, nausea, vomiting. No acute changes since last appointment, and no other complaints at this time.   Objective: AAO x3, NAD DP/PT pulses palpable bilaterally, CRT less than 3 seconds After cleaning the area to debride some of hyperkeratotic tissue that was on the toe today.  Upon debridement of the area today it appears that the underlying wound is healed.  There is a divot within the skin measuring 0.2 x 0.2 cm but there is no probing and appears to the wound is healing in.  There is no significant edema, erythema to the toe and there is normal temperature gradient. Dry, peeling, erythematous skin on the left foot consistent with tinea pedis but there is no open lesions identified. No other open lesions are identified bilaterally.   No pain with calf compression, swelling, warmth, erythema  Assessment: Ulceration, abscess right hallux with improvement   Plan: -All treatment options discussed with the patient including all alternatives, risks, complications.  -I debrided the hyperkeratotic tissue, wound on the right big toe today without any complications or bleeding to reveal the underlying area has healed.  I will need  to continue with daily dressing changes and monitor for any recurrence. Monitor for any signs or symptoms of recurrence of infection to call the office immediately should any occur or go to the emergency room. -Ketoconazole for the left foot. -Follow-up as scheduled or sooner if needed.  [name] DPM

## 2017-08-07 NOTE — Telephone Encounter (Signed)
Called pharmacy. Advised it was "ok" to dispense. Pharmacy stated it had already been done.

## 2017-08-09 ENCOUNTER — Telehealth: Payer: Self-pay | Admitting: Family Medicine

## 2017-08-09 MED ORDER — POTASSIUM CHLORIDE CRYS ER 20 MEQ PO TBCR: 20.0000 meq | EXTENDED_RELEASE_TABLET | Freq: Two times a day (BID) | ORAL | 1 refills | Status: DC

## 2017-08-09 NOTE — Telephone Encounter (Signed)
Pt was ordered this on 4/18 and advised to return in 2 weeks for repeat lab draw. Pt returned on 5/17 but repeat BMP not drawn. Would you like f/u appointment or lab only visit to check levels on this before refill?

## 2017-08-09 NOTE — Telephone Encounter (Signed)
Last crt stable and K at goal, refilled, will recheck BMP when recheck TSH

## 2017-08-09 NOTE — Telephone Encounter (Signed)
Copied from [place] [phone]. Topic: Quick Communication - See Telephone Encounter >> Aug 09, 2017 10:42 AM Conception Chancy, NT wrote: CRM for notification. See Telephone encounter for: 08/09/17.  Patient is calling and requesting a refill on potassium chloride SA (K-DUR,KLOR-CON) 20 MEQ tablet. She states she takes 1 tablet 2 times a day and that the pharmacy is requesting a new RX sent in. Patient has 1 pill left.  [place], [place] [address] Phone: [phone] Fax: [phone]

## 2017-08-09 NOTE — Addendum Note (Signed)
Addended by: Rutherford Guys on: 08/09/2017 08:28 PM   Modules accepted: Orders

## 2017-08-11 ENCOUNTER — Encounter: Payer: Self-pay | Admitting: Family Medicine

## 2017-08-13 DIAGNOSIS — R69 Illness, unspecified: Secondary | ICD-10-CM | POA: Diagnosis not present

## 2017-08-14 ENCOUNTER — Ambulatory Visit (INDEPENDENT_AMBULATORY_CARE_PROVIDER_SITE_OTHER): Payer: Medicare HMO

## 2017-08-14 ENCOUNTER — Ambulatory Visit: Payer: Medicare HMO | Admitting: Podiatry

## 2017-08-14 ENCOUNTER — Encounter: Payer: Self-pay | Admitting: Podiatry

## 2017-08-14 DIAGNOSIS — L97519 Non-pressure chronic ulcer of other part of right foot with unspecified severity: Secondary | ICD-10-CM

## 2017-08-14 MED ORDER — AMOXICILLIN-POT CLAVULANATE 875-125 MG PO TABS: 1.0000 | ORAL_TABLET | Freq: Two times a day (BID) | ORAL | 0 refills | Status: DC

## 2017-08-15 NOTE — Progress Notes (Signed)
Subjective: 77 year old female presents the office today for concerns of increased swelling and mild redness to the right big toe.  She has not had any drainage or problems she wants to have the area checked.  She states that it is getting better and she wants to cancel today's appointment but she wants to make sure.  She denies any fevers, chills, nausea, vomiting.  She did finish her antibiotics last 3 to 4 days.  No other concerns.  Objective: AAO x3, NAD DP/PT pulses palpable bilaterally, CRT less than 3 seconds On the plantar aspect of the hallux the hyperkeratotic lesion.  Upon debridement there is no underlying ulceration, drainage or any clinical signs of infection noted to that area.  However there is some mild swelling to the toe and faint erythema but there is no significant increase in warmth.  There is no fluctuation or crepitation there is no malodor.  There is no ascending cellulitis.  No other open lesions are present.  There is no pain to the toe. No pain with calf compression, swelling, warmth, erythema  Assessment: Resolving infection right hallux  Plan: -All treatment options discussed with the patient including all alternatives, risks, complications.  -She is completed 6 weeks of oral antibiotics and she has been doing well however she has had some mild recurrence of swelling and redness since discontinuing antibiotics.  We will continue for 1 more week of the Augmentin which are refilled today.  Continue antibiotic ointment and a dressing.  Offloading at all times. -X-rays were again reviewed.  No evidence of acute osteomyelitis or soft tissue emphysema is present today. -Monitor for any clinical signs or symptoms of infection and directed to call the office immediately should any occur or go to the ER. -Follow-up as scheduled or sooner if needed.  [name] DPM

## 2017-08-17 ENCOUNTER — Ambulatory Visit (INDEPENDENT_AMBULATORY_CARE_PROVIDER_SITE_OTHER): Payer: Medicare HMO

## 2017-08-17 DIAGNOSIS — I5022 Chronic systolic (congestive) heart failure: Secondary | ICD-10-CM | POA: Diagnosis not present

## 2017-08-17 DIAGNOSIS — Z95810 Presence of automatic (implantable) cardiac defibrillator: Secondary | ICD-10-CM

## 2017-08-17 NOTE — Progress Notes (Signed)
EPIC Encounter for ICM Monitoring  Patient Name: [name] is a 77 y.o. female Date: 08/17/2017 Primary Care Physican: Rutherford Guys, MD Primary Cardiologist:Berry Electrophysiologist: Caryl Comes Dry Weight: Previous weight 150 lbs Bi-V Pacing: >99%      Attempted call to patient and unable to reach.  Left detailed message, per DPR, regarding transmission.  Transmission reviewed.    Thoracic impedance is close to baseline normal but was abnormal suggesting fluid accumulation from 07/27/2017 to 08/02/2017, 08/08/2017 to 08/11/2017 and 08/12/2017 to 08/16/2017.  Prescribed dosage: Furosemide 40 mg 2 tablets (80 mg total) every AMand 1 tablet (40 mg total) every PM. Potassium 20 mEq 1 tablet daily.  Labs: 07/21/2017 Creatinine 1.26, BUN 16, Potassium 4.2, Sodium 143, EGFR 42-48 06/22/2017 Creatinine 1.12, BUN 12, Potassium 3.2, Sodium 144, EGFR 48-56 04/21/2017 Creatinine 1.28, BUN 15, Potassium 4.6, Sodium 140, EGFR 41-47 09/19/2016 Creatinine 1.39, BUN 36, Potassium 4.1, Sodium 125, EGFR 37-45 Care Everywhere results 09/13/2016 Creatinine 1.51, BUN 30, Potassium 4.6, Sodium 128, EGFR 34-41 Care Everywhere results 08/31/2016 Creatinine 1.40, BUN 16, Potassium 4.3, Sodium 137, EGFR 36-42 06/09/2016 Creatinine 1.51, BUN 23, Potassium 4.5, Sodium 135 Care Everywhere results 06/01/2016 Creatinine 1.07, BUN 5, Potassium 3.7, Sodium 143, EGFR 50->60 Care Everywhere results 05/31/2016 Creatinine 1.13, BUN 8, Potassium 3.4, Sodium 145, EGFR 47-57 Care Everywhere results 05/30/2016 Creatinine 1.25, BUN 12, Potassium 3.6, Sodium 137, EGFR 42-51 Care Everywhere results 05/05/2016 Creatinine 1.45, BUN 21, Potassium 4.5, Sodium 135 Care Everywhere results 05/26/2015 Creatinine 1.36, BUN 17, Potassium 4.5, Sodium 140  Recommendations: Left voice mail with ICM number and encouraged to call if experiencing any fluid symptoms.  Follow-up plan: ICM  clinic phone appointment on 09/18/2017.    Copy of ICM check sent to Dr. Caryl Comes.   3 month ICM trend: 08/17/2017    1 Year ICM trend:       [name], RN 08/17/2017 12:13 PM

## 2017-08-18 ENCOUNTER — Telehealth: Payer: Self-pay

## 2017-08-18 NOTE — Telephone Encounter (Signed)
Remote ICM transmission received.  Attempted call to patient and left detailed message, per DPR, regarding transmission and next ICM scheduled for 09/18/2017.  Advised to return call for any fluid symptoms or questions.

## 2017-08-23 ENCOUNTER — Telehealth: Payer: Self-pay | Admitting: Family Medicine

## 2017-08-23 NOTE — Telephone Encounter (Signed)
Copied from [place] [id number]. Topic: Quick Communication - Rx Refill/Question >> Aug 23, 2017  9:20 AM Marval Regal L wrote: Medication: l potassium chloride SA (K-DUR,KLOR-CON) 20 MEQ tablet pt called and wanted to know if she could get a 90 day supply. Pt is out.   Has the patient contacted their pharmacy? no  Preferred Pharmacy (with phone number or street name): [place], [address]  Agent: Please be advised that RX refills may take up to 3 business days. We ask that you follow-up with your pharmacy.

## 2017-08-23 NOTE — Telephone Encounter (Signed)
Refill of K-Dur  LOV 07/21/17  Dr. Pamella Pert  [facility] 08/09/17  #30  1 refill  * Pt is requesting a 90 day supply.*  [place], [place]

## 2017-08-23 NOTE — Telephone Encounter (Signed)
Pt req refill Kdur.  Per [place], pt was at goal, Kcl to be rechecked with TSH at next appt due 6/14 PT needs appt.  Sent to [facility] RN  to call pt.

## 2017-08-24 ENCOUNTER — Telehealth: Payer: Self-pay | Admitting: Cardiology

## 2017-08-24 ENCOUNTER — Ambulatory Visit: Payer: Medicare HMO | Admitting: Podiatry

## 2017-08-24 DIAGNOSIS — L97519 Non-pressure chronic ulcer of other part of right foot with unspecified severity: Secondary | ICD-10-CM | POA: Diagnosis not present
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
## 2017-08-24 NOTE — Telephone Encounter (Signed)
Dr Pamella Pert wanted to see pt in office and check her TSH and Kcl. Thank you

## 2017-08-24 NOTE — Telephone Encounter (Signed)
Please clarify... Does pt need an appt with Dr. Pamella Pert or lab draw? If labs, I did not see an order for TSH, K+ level. Spoke with pt , she states she can come in this afternoon for labs.   Patient's CB # [phone]

## 2017-08-24 NOTE — Telephone Encounter (Signed)
Patient called and stated that she is experiencing some pain around her device site. She is not sure if is nerves or something going on with her device.
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
## 2017-08-25 NOTE — Telephone Encounter (Signed)
Appt made for Wednesday 08/30/17

## 2017-08-27 NOTE — Progress Notes (Signed)
Subjective: 77 year old female presents the office today for follow-up evaluation of a wound, infection of the right hallux.  She states that she is doing much better she does not have any swelling or redness to the area.  She has a callus over the area the previous wound however she denies seeing any drainage or pus.  She has not had any discoloration and overall she states that she is doing much better and she has no other concerns today.  She has finished antibiotics as well.  Objective: AAO x3, NAD-her husband is with her today. DP/PT pulses palpable bilaterally, CRT less than 3 seconds On the plantar aspect of the hallux the hyperkeratotic lesion.  After debridement of the area there is no underlying ulceration identified there is no drainage or pus identified today.  There is no edema, erythema, increase in warmth of the toe and overall there is no signs of infection.  No other open lesions or pre-ulcerative lesion identified today. No pain with calf compression, swelling, warmth, erythema  Assessment: Resolved infection right hallux  Plan: -All treatment options discussed with the patient including all alternatives, risks, complications.  -I debrided the hyperkeratotic lesion to the any complications of bleeding there is no signs of infection or ulceration.  Overall she is doing much better she is off of antibiotics and continues to do well.  Keep a close eye on this for any recurrence however the wound is healed there is no signs of infection.  Return in about 6 weeks (around 10/05/2017).  [name] DPM

## 2017-08-30 ENCOUNTER — Ambulatory Visit: Payer: Self-pay | Admitting: Family Medicine

## 2017-09-05 ENCOUNTER — Ambulatory Visit: Payer: Self-pay | Admitting: Family Medicine

## 2017-09-11 ENCOUNTER — Ambulatory Visit: Payer: Self-pay | Admitting: Family Medicine

## 2017-09-18 ENCOUNTER — Ambulatory Visit (INDEPENDENT_AMBULATORY_CARE_PROVIDER_SITE_OTHER): Payer: Medicare HMO

## 2017-09-18 DIAGNOSIS — I5022 Chronic systolic (congestive) heart failure: Secondary | ICD-10-CM

## 2017-09-18 DIAGNOSIS — Z95810 Presence of automatic (implantable) cardiac defibrillator: Secondary | ICD-10-CM

## 2017-09-18 NOTE — Progress Notes (Signed)
EPIC Encounter for ICM Monitoring  Patient Name: [name] is a 77 y.o. female Date: 09/18/2017 Primary Care Physican: Rutherford Guys, MD Primary Cardiologist:Berry Electrophysiologist: Caryl Comes Dry Weight: 150 lbs Bi-V Pacing: >99%       Heart Failure questions reviewed, pt asymptomatic.   Thoracic impedance normal.  Prescribed dosage: Furosemide 40 mg 2 tablets (80 mg total) every AMand 1 tablet (40 mg total) every PM. Potassium 20 mEq 1 tablet daily.  Labs: 07/21/2017 Creatinine 1.26, BUN 16, Potassium 4.2, Sodium 143, EGFR 42-48 06/22/2017 Creatinine 1.12, BUN 12, Potassium 3.2, Sodium 144, EGFR 48-56 04/21/2017 Creatinine 1.28, BUN 15, Potassium 4.6, Sodium 140, EGFR 41-47 09/19/2016 Creatinine 1.39, BUN 36, Potassium 4.1, Sodium 125, EGFR 37-45 Care Everywhere results 09/13/2016 Creatinine 1.51, BUN 30, Potassium 4.6, Sodium 128, EGFR 34-41 Care Everywhere results 08/31/2016 Creatinine 1.40, BUN 16, Potassium 4.3, Sodium 137, EGFR 36-42 06/09/2016 Creatinine 1.51, BUN 23, Potassium 4.5, Sodium 135   Care Everywhere results  Recommendations: No changes.  Encouraged to call for fluid symptoms.  Follow-up plan: ICM clinic phone appointment on 10/19/2017.    Copy of ICM check sent to Dr. Caryl Comes.   3 month ICM trend: 09/18/2017    1 Year ICM trend:       Allison Billings, RN 09/18/2017 12:30 PM

## 2017-09-27 DIAGNOSIS — R69 Illness, unspecified: Secondary | ICD-10-CM | POA: Diagnosis not present

## 2017-10-05 ENCOUNTER — Ambulatory Visit: Payer: Medicare HMO | Admitting: Podiatry

## 2017-10-10 ENCOUNTER — Ambulatory Visit: Payer: Medicare HMO | Admitting: Family Medicine

## 2017-10-11 ENCOUNTER — Telehealth: Payer: Self-pay | Admitting: Family Medicine

## 2017-10-12 ENCOUNTER — Ambulatory Visit: Payer: Medicare HMO | Admitting: Family Medicine

## 2017-10-16 ENCOUNTER — Other Ambulatory Visit: Payer: Self-pay | Admitting: Family Medicine

## 2017-10-19 ENCOUNTER — Ambulatory Visit (INDEPENDENT_AMBULATORY_CARE_PROVIDER_SITE_OTHER): Payer: Medicare HMO | Admitting: *Deleted

## 2017-10-19 ENCOUNTER — Ambulatory Visit (INDEPENDENT_AMBULATORY_CARE_PROVIDER_SITE_OTHER): Payer: Medicare HMO

## 2017-10-19 DIAGNOSIS — I5022 Chronic systolic (congestive) heart failure: Secondary | ICD-10-CM

## 2017-10-19 DIAGNOSIS — Z95810 Presence of automatic (implantable) cardiac defibrillator: Secondary | ICD-10-CM | POA: Diagnosis not present

## 2017-10-19 DIAGNOSIS — I428 Other cardiomyopathies: Secondary | ICD-10-CM

## 2017-10-19 NOTE — Progress Notes (Signed)
Remote ICD transmission.   

## 2017-10-20 ENCOUNTER — Ambulatory Visit: Payer: Medicare HMO | Admitting: Podiatry

## 2017-10-20 DIAGNOSIS — R69 Illness, unspecified: Secondary | ICD-10-CM | POA: Diagnosis not present

## 2017-10-20 NOTE — Progress Notes (Signed)
EPIC Encounter for ICM Monitoring  Patient Name: [name] is a 77 y.o. female Date: 10/20/2017 Primary Care Physican: Rutherford Guys, MD Primary Cardiologist:Berry Electrophysiologist: Caryl Comes Dry Weight:150 lbs Bi-V Pacing: >99%       Heart Failure questions reviewed, pt asymptomatic.   Thoracic impedance normal.  Prescribed dosage: Furosemide 40 mg 2 tablets (80 mg total) every AMand 1 tablet (40 mg total) every PM. Potassium 20 mEq 1 tablet daily.  Labs: 07/21/2017 Creatinine 1.26, BUN 16, Potassium 4.2, Sodium 143, EGFR 42-48 06/22/2017 Creatinine 1.12, BUN 12, Potassium 3.2, Sodium 144, EGFR 48-56 04/21/2017 Creatinine 1.28, BUN 15, Potassium 4.6, Sodium 140, EGFR 41-47 09/19/2016 Creatinine 1.39, BUN 36, Potassium 4.1, Sodium 125, EGFR 37-45 Care Everywhere results 09/13/2016 Creatinine 1.51, BUN 30, Potassium 4.6, Sodium 128, EGFR 34-41 Care Everywhere results 08/31/2016 Creatinine 1.40, BUN 16, Potassium 4.3, Sodium 137, EGFR 36-42 06/09/2016 Creatinine 1.51, BUN 23, Potassium 4.5, Sodium 135   Care Everywhere results  Recommendations: No changes.  Encouraged to call for fluid symptoms.  Follow-up plan: ICM clinic phone appointment on 11/30/2017.    Copy of ICM check sent to Dr. Caryl Comes.   3 month ICM trend: 10/19/2017    1 Year ICM trend:       [name], RN 10/20/2017 9:52 AM

## 2017-10-23 ENCOUNTER — Ambulatory Visit: Payer: Medicare HMO | Admitting: Podiatry

## 2017-10-24 ENCOUNTER — Other Ambulatory Visit: Payer: Self-pay | Admitting: Family Medicine

## 2017-10-24 ENCOUNTER — Telehealth: Payer: Self-pay | Admitting: *Deleted

## 2017-10-24 NOTE — Telephone Encounter (Signed)
Copied from [place] [phone]. Topic: Quick Communication - Rx Refill/Question >> Oct 24, 2017  5:03 PM [name] wrote: Medication:  simvastatin (ZOCOR) 20 MG tablet &  Insulin Detemir (LEVEMIR) 100 UNIT/ML Pen   Has the patient contacted their pharmacy? Yes on Thursday of last week, pharmacy is getting no response (Agent: If no, request that the patient contact the pharmacy for the refill.) (Agent: If yes, when and what did the pharmacy advise?)  Preferred Pharmacy (with phone number or street name): [place], [address] [place]  Agent: Please be advised that RX refills may take up to 3 business days. We ask that you follow-up with your pharmacy.

## 2017-10-24 NOTE — Telephone Encounter (Signed)
Per patient, this is the 2nd request, pharmacy requested last week with no response.   Insulin Detemir refill Last Refill:12/06/16 by historical provider  Simvastatin refill Last refill:06/01/17 #30/5 refills Last OV: 07/21/17 PCP: [place]: [place], [place] [phone] (Phone) [phone] (Fax)

## 2017-10-24 NOTE — Telephone Encounter (Signed)
Called and left a message for the patient to call me back due to patient did not show up for her appointment yesterday august 19th, 2019. [name]

## 2017-10-25 MED ORDER — INSULIN DETEMIR 100 UNIT/ML FLEXPEN: 20.0000 [IU] | PEN_INJECTOR | Freq: Every day | SUBCUTANEOUS | 3 refills | Status: DC

## 2017-10-25 MED ORDER — SIMVASTATIN 20 MG PO TABS: 20.0000 mg | ORAL_TABLET | Freq: Every day | ORAL | 3 refills | Status: DC

## 2017-10-25 NOTE — Telephone Encounter (Signed)
Simvastatin 20 mg #30 with 3 refills and insulin detemir(levemir) 100 unit/ml/ml pen dispense 15 ml with 3 refills approved. Dgaddy, CMA

## 2017-10-30 ENCOUNTER — Ambulatory Visit: Payer: Medicare HMO | Admitting: Podiatry

## 2017-11-03 ENCOUNTER — Ambulatory Visit: Payer: Self-pay | Admitting: Family Medicine

## 2017-11-07 ENCOUNTER — Ambulatory Visit: Payer: Self-pay | Admitting: Family Medicine

## 2017-11-10 ENCOUNTER — Ambulatory Visit: Payer: Medicare HMO | Admitting: Podiatry

## 2017-11-13 DIAGNOSIS — R69 Illness, unspecified: Secondary | ICD-10-CM | POA: Diagnosis not present

## 2017-11-14 ENCOUNTER — Other Ambulatory Visit: Payer: Self-pay | Admitting: Family Medicine

## 2017-11-14 ENCOUNTER — Ambulatory Visit: Payer: Self-pay | Admitting: Family Medicine

## 2017-11-15 ENCOUNTER — Other Ambulatory Visit: Payer: Self-pay | Admitting: Family Medicine

## 2017-11-16 NOTE — Telephone Encounter (Signed)
Xanax 1 mg refill Last Refill:07/26/17 # 30   0 refills Last OV: 07/21/17.   Has upcoming appt 12/04/17 PCP: [place] [phone]  Returned because controlled substance.

## 2017-11-16 NOTE — Telephone Encounter (Signed)
pmp reviewed  Med refilled 

## 2017-11-16 NOTE — Telephone Encounter (Signed)
Xanax 1 mg refill lv 07/26/17  Upcoming appt 12/04/17

## 2017-11-17 ENCOUNTER — Other Ambulatory Visit: Payer: Self-pay | Admitting: Family Medicine

## 2017-11-20 LAB — CUP PACEART REMOTE DEVICE CHECK
Battery Remaining Longevity: 48 mo
Battery Remaining Percentage: 52 %
Battery Voltage: 2.93 V
Brady Statistic AP VP Percent: 6.2 %
Brady Statistic AP VS Percent: 1 %
Brady Statistic AS VP Percent: 94 %
Brady Statistic AS VS Percent: 1 %
Brady Statistic RA Percent Paced: 6.1 %
Date Time Interrogation Session: 20190815060016
HighPow Impedance: 69 Ohm
HighPow Impedance: 69 Ohm
Implantable Lead Implant Date: 20160406
Implantable Lead Implant Date: 20160406
Implantable Lead Implant Date: 20160406
Implantable Lead Location: 753858
Implantable Lead Location: 753859
Implantable Lead Location: 753860
Implantable Lead Model: 7122
Implantable Pulse Generator Implant Date: 20160406
Lead Channel Impedance Value: 1250 Ohm
Lead Channel Impedance Value: 380 Ohm
Lead Channel Impedance Value: 610 Ohm
Lead Channel Pacing Threshold Amplitude: 0.75 V
Lead Channel Pacing Threshold Amplitude: 0.875 V
Lead Channel Pacing Threshold Amplitude: 1.125 V
Lead Channel Pacing Threshold Pulse Width: 0.5 ms
Lead Channel Pacing Threshold Pulse Width: 0.5 ms
Lead Channel Pacing Threshold Pulse Width: 0.5 ms
Lead Channel Sensing Intrinsic Amplitude: 12 mV
Lead Channel Sensing Intrinsic Amplitude: 4.6 mV
Lead Channel Setting Pacing Amplitude: 1.875
Lead Channel Setting Pacing Amplitude: 2 V
Lead Channel Setting Pacing Amplitude: 2.125
Lead Channel Setting Pacing Pulse Width: 0.5 ms
Lead Channel Setting Pacing Pulse Width: 0.5 ms
Lead Channel Setting Sensing Sensitivity: 0.5 mV
Pulse Gen Serial Number: 7199559

## 2017-11-20 NOTE — Telephone Encounter (Signed)
Remeron refill Last Refill:07/21/17 # 30 with 3 refills Last OV: 07/21/17 PCP: Dr. Pamella Pert Pharmacy:[facility] [address]

## 2017-11-21 ENCOUNTER — Ambulatory Visit: Payer: Medicare HMO | Admitting: Podiatry

## 2017-11-24 ENCOUNTER — Ambulatory Visit: Payer: Medicare HMO | Admitting: Podiatry

## 2017-11-27 ENCOUNTER — Other Ambulatory Visit: Payer: Self-pay

## 2017-11-27 ENCOUNTER — Encounter: Payer: Self-pay | Admitting: Family Medicine

## 2017-11-27 ENCOUNTER — Ambulatory Visit (INDEPENDENT_AMBULATORY_CARE_PROVIDER_SITE_OTHER): Payer: Medicare HMO | Admitting: Family Medicine

## 2017-11-27 VITALS — BP 118/70 | HR 77 | Temp 98.0°F | Ht 66.0 in | Wt 151.0 lb

## 2017-11-27 DIAGNOSIS — Z8521 Personal history of malignant neoplasm of larynx: Secondary | ICD-10-CM | POA: Diagnosis not present

## 2017-11-27 DIAGNOSIS — E876 Hypokalemia: Secondary | ICD-10-CM

## 2017-11-27 DIAGNOSIS — C329 Malignant neoplasm of larynx, unspecified: Secondary | ICD-10-CM | POA: Diagnosis not present

## 2017-11-27 DIAGNOSIS — E039 Hypothyroidism, unspecified: Secondary | ICD-10-CM

## 2017-11-27 DIAGNOSIS — N183 Chronic kidney disease, stage 3 unspecified: Secondary | ICD-10-CM

## 2017-11-27 DIAGNOSIS — E1122 Type 2 diabetes mellitus with diabetic chronic kidney disease: Secondary | ICD-10-CM

## 2017-11-27 DIAGNOSIS — F4323 Adjustment disorder with mixed anxiety and depressed mood: Secondary | ICD-10-CM | POA: Diagnosis not present

## 2017-11-27 DIAGNOSIS — Z23 Encounter for immunization: Secondary | ICD-10-CM | POA: Diagnosis not present

## 2017-11-27 DIAGNOSIS — R69 Illness, unspecified: Secondary | ICD-10-CM | POA: Diagnosis not present

## 2017-11-27 DIAGNOSIS — Z794 Long term (current) use of insulin: Secondary | ICD-10-CM | POA: Diagnosis not present

## 2017-11-27 LAB — POCT GLYCOSYLATED HEMOGLOBIN (HGB A1C): Hemoglobin A1C: 6.1 % — AB (ref 4.0–5.6)

## 2017-11-27 MED ORDER — MIRTAZAPINE 30 MG PO TABS: 30.0000 mg | ORAL_TABLET | Freq: Every day | ORAL | 1 refills | Status: DC

## 2017-11-27 MED ORDER — POTASSIUM CHLORIDE CRYS ER 20 MEQ PO TBCR: 20.0000 meq | EXTENDED_RELEASE_TABLET | Freq: Two times a day (BID) | ORAL | 1 refills | Status: DC

## 2017-11-27 NOTE — Patient Instructions (Addendum)
[facility] [phone]     If you have lab work done today you will be contacted with your lab results within the next 2 weeks.  If you have not heard from [place] then please contact us. The fastest way to get your results is to register for My Chart.   IF you received an x-ray today, you will receive an invoice from [facility]. Please contact [facility] at [phone] with questions or concerns regarding your invoice.   IF you received labwork today, you will receive an invoice from [place]. Please contact [facility] at [phone] with questions or concerns regarding your invoice.   Our billing staff will not be able to assist you with questions regarding bills from these companies.  You will be contacted with the lab results as soon as they are available. The fastest way to get your results is to activate your My Chart account. Instructions are located on the last page of this paperwork. If you have not heard from [place] regarding the results in 2 weeks, please contact this office.

## 2017-11-27 NOTE — Progress Notes (Signed)
9/23/20198:30 AM  [name] 12-31-1940, 77 y.o. female [id number]  Chief Complaint  Patient presents with  . Follow-up    pended labs perform today tsh, a1c, stool sample to be collected at home, potassium refill needed. No acute issues stated today    HPI:   Patient is a 77 y.o. female with past medical history significant for DM2 on insulin complicated by recent osteomyelitis, hypothyroidism, anxiety and depression, CHF, CKD3, CAD, HLP, HTN,OSA who presents today for routine followup  Has upcoming appt with podiatry tomorrow Completed abx for osteomyelitis  Diarrhea has resolved since she stopped eating yogurt everyday  Having constant clear rhinorrhea nasacort was causing nasal sores  Sugars are doing "ok" Not eating very regularly   Cares for her husband full-time who has vascular dementia Depression not controlled Declines counseling, interested in support groups  Will be seeing neurosurg soon They prescribe percocet Interested in cbd oil  Has a hard time remembering to take her thyroid medication  Lab Results  Component Value Date   HGBA1C 6.4 07/21/2017    Fall Risk  11/27/2017 07/21/2017 06/22/2017 05/22/2017 04/29/2017  Falls in the past year? No Yes Yes No Yes  Number falls in past yr: - 1 2 or more - 2 or more  Injury with Fall? - No No - Yes  Risk for fall due to : - - - - History of fall(s)  Follow up - - - - Falls prevention discussed;Education provided     Depression screen [facility] 2/9 11/27/2017 05/22/2017 04/29/2017  Decreased Interest 0 0 3  Down, Depressed, Hopeless 0 0 3  PHQ - 2 Score 0 0 6  Altered sleeping - - 3  Tired, decreased energy - - 3  Change in appetite - - 3  Feeling bad or failure about yourself  - - 3  Trouble concentrating - - 3  Moving slowly or fidgety/restless - - 0  Suicidal thoughts - - 0  PHQ-9 Score - - 21  Some recent data might be hidden    Allergies  Allergen Reactions  . Valium [Diazepam] Swelling    face     Prior to Admission medications   Medication Sig Start Date End Date Taking? Authorizing Provider  ALPRAZolam Duanne Moron) 1 MG tablet TAKE 1 TABLET BY MOUTH ONCE DAILY AS NEEDED FOR ANXIETY OR SLEEP 11/16/17  Yes Rutherford Guys, MD  aspirin EC 81 MG tablet Take 81 mg by mouth at bedtime.   Yes [provider]  blood glucose meter kit and supplies KIT Dispense based on patient and insurance preference. Use three times a day as directed. Dx. E11.65, Z79.4 06/06/17  Yes Rutherford Guys, MD  carvedilol (COREG) 12.5 MG tablet TAKE 1 AND 1/2 TABLETS TWICE DAILY WITH A MEAL 06/02/17  Yes Deboraha Sprang, MD  clopidogrel (PLAVIX) 75 MG tablet Take 1 tablet (75 mg total) by mouth daily. 08/01/17  Yes Lorretta Harp, MD  diclofenac sodium (VOLTAREN) 1 % GEL Apply 2 g topically 4 (four) times daily. 05/22/17  Yes Rutherford Guys, MD  furosemide (LASIX) 40 MG tablet TAKE 2 TABLETS EVERY MORNING  AND TAKE 1 TABLET EVERY EVENING 08/01/17  Yes Lorretta Harp, MD  insulin aspart (NOVOLOG FLEXPEN) 100 UNIT/ML FlexPen Per insulin sliding scale, max TTD, 20 units. 06/26/17  Yes Rutherford Guys, MD  Insulin Detemir (LEVEMIR) 100 UNIT/ML Pen Inject 20 Units into the skin daily at 10 pm. 10/25/17  Yes Rutherford Guys, MD  Insulin Pen Needle 32G X 6 MM MISC Use new needle for each injection of insulin, four times a day. Dx E11.9, Z79.4 06/26/17  Yes Rutherford Guys, MD  ipratropium (ATROVENT) 0.03 % nasal spray Place 1 spray into both nostrils 2 (two) times daily. 05/22/17  Yes Rutherford Guys, MD  ketoconazole (NIZORAL) 2 % cream Apply 1 application topically daily. 08/03/17  Yes [name], DPM  levothyroxine (SYNTHROID, LEVOTHROID) 88 MCG tablet TAKE 1 TABLET BY MOUTH AT BEDTIME 11/14/17  Yes Rutherford Guys, MD  lisinopril (PRINIVIL,ZESTRIL) 2.5 MG tablet Take by mouth. 03/25/16  Yes [provider]  MELATONIN PO Take 2 tablets by mouth at bedtime.   Yes [provider]   mirtazapine (REMERON) 15 MG tablet TAKE 1 TABLET BY MOUTH AT BEDTIME 11/22/17  Yes Rutherford Guys, MD  mupirocin ointment (BACTROBAN) 2 % Apply 1 application topically 2 (two) times daily. 07/21/17  Yes [name], DPM  nitroGLYCERIN (NITROSTAT) 0.4 MG SL tablet Place 1 tablet (0.4 mg total) under the tongue every 5 (five) minutes as needed for chest pain. 05/01/13  Yes Brett Canales, PA-C  ONE TOUCH ULTRA TEST test strip  06/07/17  Yes [provider]  Jonetta Speak LANCETS 34V [place]  06/07/17  Yes [provider]  oxyCODONE-acetaminophen (PERCOCET/ROXICET) 5-325 MG tablet Take 1 tablet by mouth every 6 (six) hours as needed for severe pain.    Yes [provider]  pantoprazole (PROTONIX) 40 MG tablet Take 40 mg by mouth 2 (two) times daily.  04/30/13  Yes [provider]  potassium chloride SA (K-DUR,KLOR-CON) 20 MEQ tablet Take 1 tablet (20 mEq total) by mouth 2 (two) times daily. 08/09/17  Yes Rutherford Guys, MD  potassium chloride SA (K-DUR,KLOR-CON) 20 MEQ tablet TAKE 1 TABLET BY MOUTH TWICE DAILY 10/16/17  Yes Rutherford Guys, MD  simvastatin (ZOCOR) 20 MG tablet Take 1 tablet (20 mg total) by mouth daily with supper. 10/25/17  Yes Rutherford Guys, MD  amoxicillin-clavulanate (AUGMENTIN) 875-125 MG tablet Take 1 tablet by mouth 2 (two) times daily. Patient not taking: Reported on 11/27/2017 08/14/17   [name], DPM    Past Medical History:  Diagnosis Date  . Anxiety   . Arthritis   . Chronic pain    a. Prior h/o chronic pain on methadone.  . Chronic systolic CHF (congestive heart failure) (HCC)    a. mixed ischemic/non-ischemic cardiomyopathy. b. s/p CRT-D in 06/2014.  Marland Kitchen CKD (chronic kidney disease), stage III ([place])   . Complication of anesthesia    hard time waking her up from general surgery  . Coronary artery disease    a. remote RCA stenting in 2008 with non-DES. b. Cath 06/2014 following abnormal nuc: Stable, unchanged from prior  cath, patent stent and 40% LM.  . Diabetes mellitus ([place])   . GERD (gastroesophageal reflux disease)   . Headache   . Hyperlipidemia   . Hypertension   . LBBB (left bundle branch block)   . Nonischemic cardiomyopathy ([place])   . OSA (obstructive sleep apnea)    AHI-9.77/hr, during REM-50.32/hr  . Tobacco abuse     Past Surgical History:  Procedure Laterality Date  . BACK SURGERY  2013  . BI-VENTRICULAR IMPLANTABLE CARDIOVERTER DEFIBRILLATOR N/A 06/11/2014   STJ CRTD implanted by Dr Caryl Comes  . CARDIAC CATHETERIZATION  12/14/2006   RCA stented with a 3.0 [employer] Liberte stent resulting in a reduction of 75% to 0% residual  .  CHOLECYSTECTOMY     20 years ago  . COLONOSCOPY WITH PROPOFOL N/A 12/15/2014   Procedure: COLONOSCOPY WITH PROPOFOL;  Surgeon: Clarene Essex, MD;  Location: [facility] ENDOSCOPY;  Service: Endoscopy;  Laterality: N/A;  . EYE SURGERY     bilateral cataract surgery   . LEFT AND RIGHT HEART CATHETERIZATION WITH CORONARY ANGIOGRAM N/A 05/29/2014   Procedure: LEFT AND RIGHT HEART CATHETERIZATION WITH CORONARY ANGIOGRAM;  Surgeon: Lorretta Harp, MD;  Location[facility] CATH LAB;  Service: Cardiovascular;  Laterality: N/A;  . LEFT HEART CATHETERIZATION WITH CORONARY ANGIOGRAM N/A 12/27/2012   Procedure: LEFT HEART CATHETERIZATION WITH CORONARY ANGIOGRAM;  Surgeon: Lorretta Harp, MD;  Location: [facility] CATH LAB;  Service: Cardiovascular;  Laterality: N/A;    Social History   Tobacco Use  . Smoking status: Current Every Day Smoker    Packs/day: 2.00    Years: 58.00    Pack years: 116.00    Types: Cigarettes    Start date: 12/26/1952    Last attempt to quit: 12/05/2013    Years since quitting: 3.9  . Smokeless tobacco: Never Used  . Tobacco comment: uses Vape cigarettes  Substance Use Topics  . Alcohol use: No    Alcohol/week: 0.0 standard drinks    Family History  Problem Relation Age of Onset  . Stroke Mother   . Hypertension Mother   . Coronary artery disease Father    . Stroke Brother   . Heart disease Brother   . Other Brother        H1N1 VIRUS  . Healthy Sister   . Healthy Sister     Review of Systems  Constitutional: Negative for chills and fever.  Respiratory: Negative for cough and shortness of breath.   Cardiovascular: Negative for chest pain, palpitations and leg swelling.  Gastrointestinal: Negative for abdominal pain, diarrhea, nausea and vomiting.  Musculoskeletal: Positive for joint pain.  Psychiatric/Behavioral: Positive for depression. Negative for suicidal ideas. The patient is nervous/anxious and has insomnia.      OBJECTIVE:  Blood pressure 118/70, pulse 77, temperature 98 F (36.7 C), temperature source Oral, height '5\' 6"'$  (1.676 m), weight 151 lb (68.5 kg), SpO2 98 %. Body mass index is 24.37 kg/m.   Physical Exam  Constitutional: She is oriented to person, place, and time. She appears well-developed and well-nourished.  HENT:  Head: Normocephalic and atraumatic.  Mouth/Throat: Oropharynx is clear and moist. No oropharyngeal exudate.  Eyes: Pupils are equal, round, and reactive to light. Conjunctivae and EOM are normal. No scleral icterus.  Neck: Neck supple.  Cardiovascular: Normal rate, regular rhythm and normal heart sounds. Exam reveals no gallop and no friction rub.  No murmur heard. Pulmonary/Chest: Effort normal and breath sounds normal. She has no wheezes. She has no rales.  Musculoskeletal: She exhibits no edema.  Neurological: She is alert and oriented to person, place, and time.  Skin: Skin is warm and dry.  Psychiatric: She has a normal mood and affect.  Nursing note and vitals reviewed.   Results for orders placed or performed in visit on 11/27/17 (from the past 24 hour(s))  POCT A1C     Status: Abnormal   Collection Time: 11/27/17  8:44 AM  Result Value Ref Range   Hemoglobin A1C 6.1 (A) 4.0 - 5.6 %   HbA1c POC (<> result, manual entry)     HbA1c, POC (prediabetic range)     HbA1c, POC (controlled  diabetic range)       ASSESSMENT and PLAN  1.  Type 2 diabetes mellitus with stage 3 chronic kidney disease, with long-term current use of insulin (HCC) Controlled. Continue current regime.  - POCT A1C  2. Hypothyroidism, unspecified type Checking labs today, medications will be adjusted as needed.  - TSH  3. Hypokalemia Checking labs today, medications will be adjusted as needed.  - Basic Metabolic Panel  4. Adjustment reaction with anxiety and depression Not controlled, increasing mirtazapine. Provided resource info for support services  5. Need for prophylactic vaccination and inoculation against influenza - Flu vaccine HIGH DOSE PF (Fluzone High dose)  Other orders - mirtazapine (REMERON) 30 MG tablet; Take 1 tablet (30 mg total) by mouth at bedtime. - Influenza vac split quadrivalent PF (FLUZONE HIGH-DOSE) 0.5 ML injection; Fluzone High-Dose 2015-16 (PF) 180 mcg/0.5 mL intramuscular syringe - potassium chloride SA (K-DUR,KLOR-CON) 20 MEQ tablet; Take 1 tablet (20 mEq total) by mouth 2 (two) times daily.   Return in about 3 months (around 02/26/2018).    Rutherford Guys, MD Primary Care at [facility] [place], [place] [address] Ph.  [phone] Fax [phone]

## 2017-11-28 LAB — BASIC METABOLIC PANEL
BUN/Creatinine Ratio: 9 — ABNORMAL LOW (ref 12–28)
BUN: 11 mg/dL (ref 8–27)
CO2: 26 mmol/L (ref 20–29)
Calcium: 9.6 mg/dL (ref 8.7–10.3)
Chloride: 101 mmol/L (ref 96–106)
Creatinine, Ser: 1.2 mg/dL — ABNORMAL HIGH (ref 0.57–1.00)
GFR calc Af Amer: 51 mL/min/{1.73_m2} — ABNORMAL LOW (ref >59)
GFR calc non Af Amer: 44 mL/min/{1.73_m2} — ABNORMAL LOW (ref >59)
Glucose: 77 mg/dL (ref 65–99)
Potassium: 4.1 mmol/L (ref 3.5–5.2)
Sodium: 146 mmol/L — ABNORMAL HIGH (ref 134–144)

## 2017-11-28 LAB — TSH: TSH: 5 u[IU]/mL — ABNORMAL HIGH (ref 0.450–4.500)

## 2017-12-04 ENCOUNTER — Ambulatory Visit: Payer: Self-pay | Admitting: Family Medicine

## 2017-12-06 ENCOUNTER — Ambulatory Visit: Payer: Medicare HMO | Admitting: Cardiovascular Disease

## 2017-12-11 ENCOUNTER — Ambulatory Visit: Payer: Medicare HMO | Admitting: Podiatry

## 2017-12-15 ENCOUNTER — Other Ambulatory Visit: Payer: Self-pay | Admitting: Internal Medicine

## 2017-12-15 DIAGNOSIS — I2510 Atherosclerotic heart disease of native coronary artery without angina pectoris: Secondary | ICD-10-CM

## 2017-12-18 NOTE — Progress Notes (Signed)
No ICM remote transmission received for 11/30/2017 and next ICM transmission scheduled for 12/26/2017.

## 2017-12-26 ENCOUNTER — Ambulatory Visit (INDEPENDENT_AMBULATORY_CARE_PROVIDER_SITE_OTHER): Payer: Medicare HMO

## 2017-12-26 DIAGNOSIS — Z95810 Presence of automatic (implantable) cardiac defibrillator: Secondary | ICD-10-CM | POA: Diagnosis not present

## 2017-12-26 DIAGNOSIS — I5022 Chronic systolic (congestive) heart failure: Secondary | ICD-10-CM | POA: Diagnosis not present

## 2017-12-26 NOTE — Progress Notes (Signed)
EPIC Encounter for ICM Monitoring  Patient Name: [name] is a 77 y.o. female Date: 12/26/2017 Primary Care Physican: Rutherford Guys, MD Primary Cardiologist:Berry Electrophysiologist: Caryl Comes Dry Weight:150 lbs Bi-V Pacing: >99%      Heart Failure questions reviewed, pt asymptomatic.   Thoracic impedance normal.   Prescribed: Furosemide 40 mg 2 tablets (80 mg total) every AMand 1 tablet (40 mg total) every PM. Potassium 20 mEq 1 tablet daily.  Labs: 07/21/2017 Creatinine 1.26, BUN 16, Potassium 4.2, Sodium 143, EGFR 42-48 06/22/2017 Creatinine 1.12, BUN 12, Potassium 3.2, Sodium 144, EGFR 48-56 04/21/2017 Creatinine 1.28, BUN 15, Potassium 4.6, Sodium 140, EGFR 41-47 09/19/2016 Creatinine 1.39, BUN 36, Potassium 4.1, Sodium 125, EGFR 37-45 Care Everywhere results 09/13/2016 Creatinine 1.51, BUN 30, Potassium 4.6, Sodium 128, EGFR 34-41 Care Everywhere results 08/31/2016 Creatinine 1.40, BUN 16, Potassium 4.3, Sodium 137, EGFR 36-42 06/09/2016 Creatinine 1.51, BUN 23, Potassium 4.5, Sodium 135  Care Everywhere results  Recommendations: No changes.  Encouraged to call for fluid symptoms.  Follow-up plan: ICM clinic phone appointment on 01/29/2018.   Office appointment scheduled 12/27/2017 with Dr. Gwenlyn Found.    Copy of ICM check sent to Dr. Caryl Comes and Dr Gwenlyn Found (office appt tomorrow, 12/27/17).   3 month ICM trend: 12/26/2017    1 Year ICM trend:       [name], RN 12/26/2017 11:56 AM

## 2017-12-27 ENCOUNTER — Ambulatory Visit: Payer: Medicare HMO | Admitting: Cardiovascular Disease

## 2017-12-28 NOTE — Telephone Encounter (Signed)
done

## 2017-12-29 ENCOUNTER — Ambulatory Visit: Payer: Medicare HMO | Admitting: Physician Assistant

## 2018-01-10 ENCOUNTER — Other Ambulatory Visit: Payer: Self-pay | Admitting: Family Medicine

## 2018-01-10 ENCOUNTER — Telehealth: Payer: Self-pay | Admitting: Family Medicine

## 2018-01-10 NOTE — Telephone Encounter (Signed)
Copied from [place] [id number]. Topic: Quick Communication - Rx Refill/Question >> Jan 10, 2018  8:42 AM Margot Ables wrote: Medication: ALPRAZolam Duanne Moron) 1 MG tablet - 2 left  Last OV 11/27/17 Dr. Pamella Pert Next OV 02/23/18 Dr. Pamella Pert  Has the patient contacted their pharmacy? No - bottle says no refills and to contact MD Preferred Pharmacy (with phone number or street name): [place], [place] [phone] (Phone) [phone] (Fax)

## 2018-01-11 MED ORDER — ALPRAZOLAM 0.5 MG PO TABS: 0.5000 mg | ORAL_TABLET | Freq: Every evening | ORAL | 0 refills | Status: DC | PRN

## 2018-01-11 NOTE — Telephone Encounter (Signed)
pmp reviewed Last rx 11/16/17 for 30 tabs, previous rx in may for 30 tabs

## 2018-01-11 NOTE — Telephone Encounter (Signed)
Requested medication (s) are due for refill today: Yes  Requested medication (s) are on the active medication list: Yes  Last refill:  11/16/17  Future visit scheduled: Yes  Notes to clinic:  See request.    Requested Prescriptions  Pending Prescriptions Disp Refills   ALPRAZolam (XANAX) 1 MG tablet [Pharmacy Med Name: ALPRAZolam 1MG       TAB] 30 tablet 0    Sig: TAKE 1 TABLET BY MOUTH ONCE DAILY AS NEEDED FOR ANXIETY OR SLEEP     Not Delegated - Psychiatry:  Anxiolytics/Hypnotics Failed - 01/10/2018  8:29 PM      Failed - This refill cannot be delegated      Failed - Urine Drug Screen completed in last 360 days.      Passed - Valid encounter within last 6 months    Recent Outpatient Visits          1 month ago Type 2 diabetes mellitus with stage 3 chronic kidney disease, with long-term current use of insulin ([place])   [facility], Lilia Argue, MD   5 months ago Type 2 diabetes mellitus with stage 3 chronic kidney disease, with long-term current use of insulin [facility])   [facility], Lilia Argue, MD   6 months ago Type 2 diabetes mellitus with stage 3 chronic kidney disease, with long-term current use of insulin [facility])   [facility], Lilia Argue, MD   7 months ago Ulcer of right foot, unspecified ulcer stage [facility])   [facility] at [facility], Lilia Argue, MD   8 months ago Ulcer of right foot, unspecified ulcer stage [facility])   Primary Care at [facility], [place], [place]      Future Appointments            In 2 weeks Gwenlyn Found, Pearletha Forge, MD [facility] [place], [place]   In 1 month Pamella Pert, Lilia Argue, MD Primary Care at [place], [facility]

## 2018-01-11 NOTE — Telephone Encounter (Signed)
Pt called and states she was incorrect yesterday and that she only a piece of 1 tablet left yesterday and she hasn't slept in 2 nights. Again advised pt that refill request process is to allow up to 3 business days for processing.  [place], [place] Susan Moore          [phone] (Phone) [phone] (Fax)

## 2018-01-29 ENCOUNTER — Ambulatory Visit (INDEPENDENT_AMBULATORY_CARE_PROVIDER_SITE_OTHER): Payer: Medicare HMO

## 2018-01-29 DIAGNOSIS — I5022 Chronic systolic (congestive) heart failure: Secondary | ICD-10-CM | POA: Diagnosis not present

## 2018-01-29 DIAGNOSIS — Z95810 Presence of automatic (implantable) cardiac defibrillator: Secondary | ICD-10-CM

## 2018-01-29 DIAGNOSIS — I428 Other cardiomyopathies: Secondary | ICD-10-CM

## 2018-01-29 NOTE — Progress Notes (Signed)
EPIC Encounter for ICM Monitoring  Patient Name: [name] is a 77 y.o. female Date: 01/29/2018 Primary Care Physican: Rutherford Guys, MD Primary Cardiologist:Berry Electrophysiologist: Caryl Comes Dry Weight:150 lbs Bi-V Pacing: >99%                                         Heart Failure questions reviewed, pt asymptomatic.   Thoracic impedance normal.   Prescribed: Furosemide 40 mg 2 tablets (80 mg total) every AMand 1 tablet (40 mg total) every PM. Potassium 20 mEq 1 tablet daily.  Labs: 11/27/2017 Creatinine 1.20, BUN 11, Potassium 4.1, Sodium 146, eGFR 44-21 07/21/2017 Creatinine 1.26, BUN 16, Potassium 4.2, Sodium 143, EGFR 42-48 06/22/2017 Creatinine 1.12, BUN 12, Potassium 3.2, Sodium 144, EGFR 48-56 04/21/2017 Creatinine 1.28, BUN 15, Potassium 4.6, Sodium 140, EGFR 41-47 09/19/2016 Creatinine 1.39, BUN 36, Potassium 4.1, Sodium 125, EGFR 37-45 Care Everywhere results 09/13/2016 Creatinine 1.51, BUN 30, Potassium 4.6, Sodium 128, EGFR 34-41 Care Everywhere results 08/31/2016 Creatinine 1.40, BUN 16, Potassium 4.3, Sodium 137, EGFR 36-42 06/09/2016 Creatinine 1.51, BUN 23, Potassium 4.5, Sodium 135  Care Everywhere results  Recommendations: No changes.  Encouraged to call for fluid symptoms.  Follow-up plan: ICM clinic phone appointment on 03/01/2018.   Office appointment scheduled 01/30/2018 with Dr. Gwenlyn Found.    Copy of ICM check sent to Dr. Caryl Comes and Dr Gwenlyn Found (office appt tomorrow, 01/30/18).   3 month ICM trend: 01/29/2018    1 Year ICM trend:       [name], RN 01/29/2018 5:33 PM

## 2018-01-29 NOTE — Progress Notes (Signed)
Remote ICD transmission.   

## 2018-01-30 ENCOUNTER — Ambulatory Visit: Payer: Medicare HMO | Admitting: Cardiovascular Disease

## 2018-01-30 ENCOUNTER — Encounter: Payer: Self-pay | Admitting: Cardiovascular Disease

## 2018-01-30 VITALS — BP 112/58 | HR 75 | Ht 66.0 in | Wt 150.2 lb

## 2018-01-30 DIAGNOSIS — R0989 Other specified symptoms and signs involving the circulatory and respiratory systems: Secondary | ICD-10-CM

## 2018-01-30 DIAGNOSIS — I10 Essential (primary) hypertension: Secondary | ICD-10-CM | POA: Diagnosis not present

## 2018-01-30 DIAGNOSIS — I5022 Chronic systolic (congestive) heart failure: Secondary | ICD-10-CM | POA: Diagnosis not present

## 2018-01-30 DIAGNOSIS — Z720 Tobacco use: Secondary | ICD-10-CM | POA: Diagnosis not present

## 2018-01-30 DIAGNOSIS — E7800 Pure hypercholesterolemia, unspecified: Secondary | ICD-10-CM

## 2018-01-30 NOTE — Assessment & Plan Note (Signed)
History of ongoing tobacco abuse of 1 pack/day recalcitrant risk factor modification. 

## 2018-01-30 NOTE — Assessment & Plan Note (Signed)
History of chronic systolic heart failure status post coronary intervention in the past by Dr. Melvern Banker with stenting of her proximal RCA and moderate distal left main disease.  I performed cardiac catheterization 12/26/2012 revealing essentially unchanged anatomy however her EF dramatically declined.  She ultimately underwent biventricular ICD implantation with marked improvement in her EF up to 50 to 55% followed by Dr. Caryl Comes.

## 2018-01-30 NOTE — Progress Notes (Signed)
01/30/2018 [name]   [birth year]  [id number]  Primary Physician Rutherford Guys, MD Primary Cardiologist: Lorretta Harp MD FACP, [place], [place], [place]  HPI:  [name] is a 77 y.o.  mildly overweight married Caucasian female, mother of 68, grandmother of 2 grandchildren, whom I last saw in the office  12/06/2016. Unfortunately, there are 47 year old son committed suicide by shooting himself 2 weeks ago and they're very distraught at the current time. She has a history of CAD, status post RCA stenting in the past. She was catheterized by Dr. Janene Madeira May 04, 2007, after abnormal Myoview revealing 30% to 40% eccentric distal left main. A proximal RCA stent was patent and her EF was normal. Other problems include hypertension, hyperlipidemia, and diabetes. She has chronic left bundle branch block. She smokes 1-1/2 packs of cigarettes a day. She has had back surgery by Dr. Lissa Merlin. Myoview stress test performed March 24, 2010, revealed new anteroapical scar, and because of shortness of breath I catheterized her March 31, 2010, revealing 50% "in-stent restenosis" within the RCA stent, which was smooth, 40% distal left main with anatomy unchanged from the prior study and normal LV function. She really denies chest pain or shortness of breath. Her most recent lab work, performed by Dr. Buddy Duty in June, revealed a total cholesterol of 138, LDL of 57, HDL of 44.  I saw her one year ago. Over the last several weeks she developed new onset chest pressure occurring every other day lasting for minutes at a time associated with diaphoresis. I'm concerned that she may have progression of her left main disease or and/or her RCA in-stent restenosis. Her Myoview performed prior to her last cath in January 2012 was remarkable for anteroapical scar versus breast attenuation artifact. Based on her anatomy and her symptoms I elected to proceed with outpatient diagnostic coronary arteriography which I performed  on 12/26/12 revealing essentially unchanged anatomy. Her RCA stent was patent her left main was mild. She did have mild to moderate left ventricular dysfunction with an EF in the 45% range. 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 She gets occasional chest pain which has not changed in frequency or severity. She does complain of back pain and leg pain and is seeing Dr. Ellene Route for this. She has decreased pedal pulses on exam raising the issue of the possibility of peripheral vascular disease. Lower extremity arterial Doppler studies performed in our office 03/14/14 revealed ABIs of 1 bilaterally high-frequency signal in the left external iliac artery although her symptoms are symmetric. She saw Molli Hazard in the office 05/14/14 complaining of dyspnea on exertion and diaphoresis. A 2-D echo performed 05/20/14 as well as a Myoview stress test showed significant worsening of LV function with an EF 20%, a dilated left ventricle and moderate MR with a Myoview that showed scar in the anterior wall apex and septum markedly different than prior functional studies. Based on this result I performed cardiac catheterization on her 05/29/14 revealing unchanged coronary anatomy which was deemed as moderate and stable with severe LV dysfunction and elevated filling pressures. She subsequently underwent biventricular ICD implantation by Dr. Caryl Comes on 06/11/14 with an excellent clinical result which was almost immediate. She now denies chest pain or shortness of breath. Her recent 2-D echo performed 10/22/15 revealed an EF of 50-55%. Since I saw her. She's remained clinically stable. She has an appointment to  see Dr. Caryl Comes in the upcoming future for ICD follow-up.  Current Meds  Medication Sig  . ALPRAZolam (XANAX) 0.5 MG tablet Take 1 tablet (0.5 mg total) by mouth at bedtime as needed for anxiety (medication is NOT meant to be taken every  night.).  Marland Kitchen aspirin EC 81 MG tablet Take 81 mg by mouth at bedtime.  . blood glucose meter kit and supplies KIT Dispense based on patient and insurance preference. Use three times a day as directed. Dx. E11.65, Z79.4  . carvedilol (COREG) 12.5 MG tablet TAKE 1 & 1/2 (ONE & ONE-HALF) TABLETS BY MOUTH TWICE DAILY WITH A MEAL  . clopidogrel (PLAVIX) 75 MG tablet Take 1 tablet (75 mg total) by mouth daily.  . diclofenac sodium (VOLTAREN) 1 % GEL Apply 2 g topically 4 (four) times daily.  . furosemide (LASIX) 40 MG tablet TAKE 2 TABLETS EVERY MORNING  AND TAKE 1 TABLET EVERY EVENING  . Influenza vac split quadrivalent PF (FLUZONE HIGH-DOSE) 0.5 ML injection Fluzone High-Dose 2015-16 (PF) 180 mcg/0.5 mL intramuscular syringe  . insulin aspart (NOVOLOG FLEXPEN) 100 UNIT/ML FlexPen Per insulin sliding scale, max TTD, 20 units.  . Insulin Detemir (LEVEMIR) 100 UNIT/ML Pen Inject 20 Units into the skin daily at 10 pm.  . Insulin Pen Needle 32G X 6 MM MISC Use new needle for each injection of insulin, four times a day. Dx E11.9, Z79.4  . levothyroxine (SYNTHROID, LEVOTHROID) 88 MCG tablet TAKE 1 TABLET BY MOUTH AT BEDTIME  . lisinopril (PRINIVIL,ZESTRIL) 2.5 MG tablet Take by mouth.  . MELATONIN PO Take 2 tablets by mouth at bedtime.  . mirtazapine (REMERON) 30 MG tablet Take 1 tablet (30 mg total) by mouth at bedtime.  . mupirocin ointment (BACTROBAN) 2 % Apply 1 application topically 2 (two) times daily.  . nitroGLYCERIN (NITROSTAT) 0.4 MG SL tablet Place 1 tablet (0.4 mg total) under the tongue every 5 (five) minutes as needed for chest pain.  . ONE TOUCH ULTRA TEST test strip   . ONETOUCH DELICA LANCETS 67H MISC   . oxyCODONE-acetaminophen (PERCOCET/ROXICET) 5-325 MG tablet Take 1 tablet by mouth every 6 (six) hours as needed for severe pain.   . pantoprazole (PROTONIX) 40 MG tablet Take 40 mg by mouth 2 (two) times daily.   . potassium chloride SA (K-DUR,KLOR-CON) 20 MEQ tablet Take 1 tablet (20 mEq  total) by mouth 2 (two) times daily.  . simvastatin (ZOCOR) 20 MG tablet Take 1 tablet (20 mg total) by mouth daily with supper.     Allergies  Allergen Reactions  . Valium [Diazepam] Swelling    face    Social History   Socioeconomic History  . Marital status: Married    Spouse name: Not on file  . Number of children: Not on file  . Years of education: Not on file  . Highest education level: Not on file  Occupational History  . Not on file  Social Needs  . Financial resource strain: Not on file  . Food insecurity:    Worry: Not on file    Inability: Not on file  . Transportation needs:    Medical: Not on file    Non-medical: Not on file  Tobacco Use  . Smoking status: Current Every Day Smoker    Packs/day: 2.00    Years: 58.00    Pack years: 116.00    Types: Cigarettes    Start date: 12/26/1952    Last attempt to quit: 12/05/2013    Years since  quitting: 4.1  . Smokeless tobacco: Never Used  . Tobacco comment: uses Vape cigarettes  Substance and Sexual Activity  . Alcohol use: No    Alcohol/week: 0.0 standard drinks  . Drug use: No  . Sexual activity: Not on file  Lifestyle  . Physical activity:    Days per week: Not on file    Minutes per session: Not on file  . Stress: Not on file  Relationships  . Social connections:    Talks on phone: Not on file    Gets together: Not on file    Attends religious service: Not on file    Active member of club or organization: Not on file    Attends meetings of clubs or organizations: Not on file    Relationship status: Not on file  . Intimate partner violence:    Fear of current or ex partner: Not on file    Emotionally abused: Not on file    Physically abused: Not on file    Forced sexual activity: Not on file  Other Topics Concern  . Not on file  Social History Narrative   Patient lives in [place] with his husband and grandson.   Son completed suicide on September 17th of this year, patient found him.      Review of Systems: General: negative for chills, fever, night sweats or weight changes.  Cardiovascular: negative for chest pain, dyspnea on exertion, edema, orthopnea, palpitations, paroxysmal nocturnal dyspnea or shortness of breath Dermatological: negative for rash Respiratory: negative for cough or wheezing Urologic: negative for hematuria Abdominal: negative for nausea, vomiting, diarrhea, bright red blood per rectum, melena, or hematemesis Neurologic: negative for visual changes, syncope, or dizziness All other systems reviewed and are otherwise negative except as noted above.    Blood pressure (!) 112/58, pulse 75, height _0  (1.676 m), weight 150 lb 3.2 oz (68.1 kg).  General appearance: alert and no distress Neck: no adenopathy, no JVD, supple, symmetrical, trachea midline, thyroid not enlarged, symmetric, no tenderness/mass/nodules and Bilateral carotid bruits left louder than right Lungs: clear to auscultation bilaterally Heart: regular rate and rhythm, S1, S2 normal, no murmur, click, rub or gallop Extremities: extremities normal, atraumatic, no cyanosis or edema Pulses: 2+ and symmetric Skin: Skin color, texture, turgor normal. No rashes or lesions Neurologic: Alert and oriented X 3, normal strength and tone. Normal symmetric reflexes. Normal coordination and gait  EKG sinus rhythm 75 with ventricular paced rhythm.  I personally reviewed this EKG.  ASSESSMENT AND PLAN:   Essential hypertension History of essential hypertension her blood pressure measured today 112/58.  She is on carvedilol and lisinopril.  Continue current meds at current dosing.  Hyperlipidemia History of hyperlipidemia on simvastatin 20 mg with a recent cholesterol performed 04/21/2017 revealing total cholesterol 148, LDL 79 HDL 46.  Tobacco abuse History of ongoing tobacco abuse of 1 pack/day recalcitrant risk factor modification.  Chronic systolic CHF (congestive heart failure) History of  chronic systolic heart failure status post coronary intervention in the past by Dr. Melvern Banker with stenting of her proximal RCA and moderate distal left main disease.  I performed cardiac catheterization 12/26/2012 revealing essentially unchanged anatomy however her EF dramatically declined.  She ultimately underwent biventricular ICD implantation with marked improvement in her EF up to 50 to 55% followed by Dr. Caryl Comes.      Lorretta Harp MD FACP,FACC,FAHA, [facility] 01/30/2018 3:18 PM

## 2018-01-30 NOTE — Assessment & Plan Note (Signed)
History of hyperlipidemia on simvastatin 20 mg with a recent cholesterol performed 04/21/2017 revealing total cholesterol 148, LDL 79 HDL 46.

## 2018-01-30 NOTE — Assessment & Plan Note (Signed)
History of essential hypertension her blood pressure measured today 112/58.  She is on carvedilol and lisinopril.  Continue current meds at current dosing.

## 2018-01-30 NOTE — Patient Instructions (Signed)
Medication Instructions:  NONE If you need a refill on your cardiac medications before your next appointment, please call your pharmacy.   Lab work: NONE If you have labs (blood work) drawn today and your tests are completely normal, you will receive your results only by: Marland Kitchen MyChart Message (if you have MyChart) OR . A paper copy in the mail If you have any lab test that is abnormal or we need to change your treatment, we will call you to review the results.  Testing/Procedures: Your physician has requested that you have a carotid duplex. This test is an ultrasound of the carotid arteries in your neck. It looks at blood flow through these arteries that supply the brain with blood. Allow one hour for this exam. There are no restrictions or special instructions.   Follow-Up: At [facility], you and your health needs are our priority.  As part of our continuing mission to provide you with exceptional heart care, we have created designated Provider Care Teams.  These Care Teams include your primary Cardiologist (physician) and Advanced Practice Providers (APPs -  Physician Assistants and Nurse Practitioners) who all work together to provide you with the care you need, when you need it. You will need a follow up appointment in 12 months.  Please call our office 2 months in advance to schedule this appointment.  You may see Dr. Gwenlyn Found or one of the following Advanced Practice Providers on your designated Care Team:   Kerin Ransom, PA-C Roby Lofts, [place] . Sande Rives, PA-C

## 2018-01-31 ENCOUNTER — Other Ambulatory Visit: Payer: Self-pay | Admitting: Cardiovascular Disease

## 2018-01-31 DIAGNOSIS — I2510 Atherosclerotic heart disease of native coronary artery without angina pectoris: Secondary | ICD-10-CM

## 2018-02-11 ENCOUNTER — Other Ambulatory Visit: Payer: Self-pay | Admitting: Cardiovascular Disease

## 2018-02-13 ENCOUNTER — Ambulatory Visit (HOSPITAL_COMMUNITY)
Admission: RE | Admit: 2018-02-13 | Payer: Medicare HMO | Source: Ambulatory Visit | Attending: Cardiovascular Disease | Admitting: Cardiovascular Disease

## 2018-02-15 ENCOUNTER — Ambulatory Visit (HOSPITAL_COMMUNITY)
Admission: RE | Admit: 2018-02-15 | Payer: Medicare HMO | Source: Ambulatory Visit | Attending: Cardiovascular Disease | Admitting: Cardiovascular Disease

## 2018-02-23 ENCOUNTER — Other Ambulatory Visit: Payer: Self-pay | Admitting: Family Medicine

## 2018-02-23 ENCOUNTER — Encounter (HOSPITAL_COMMUNITY): Payer: Self-pay

## 2018-02-23 ENCOUNTER — Ambulatory Visit: Payer: Medicare HMO | Admitting: Family Medicine

## 2018-03-01 ENCOUNTER — Ambulatory Visit (INDEPENDENT_AMBULATORY_CARE_PROVIDER_SITE_OTHER): Payer: Medicare HMO

## 2018-03-01 DIAGNOSIS — I447 Left bundle-branch block, unspecified: Secondary | ICD-10-CM

## 2018-03-01 DIAGNOSIS — I5022 Chronic systolic (congestive) heart failure: Secondary | ICD-10-CM

## 2018-03-01 DIAGNOSIS — Z95810 Presence of automatic (implantable) cardiac defibrillator: Secondary | ICD-10-CM | POA: Diagnosis not present

## 2018-03-01 NOTE — Progress Notes (Signed)
EPIC Encounter for ICM Monitoring  Patient Name: [name] is a 77 y.o. female Date: 03/01/2018 Primary Care Physican: Rutherford Guys, MD Primary Cardiologist:Berry Electrophysiologist: Caryl Comes Last Weight:150 lbs Bi-V Pacing: >99%  Transmission reviewed.  Thoracic impedance normal.   Prescribed:Furosemide 40 mg 2 tablets (80 mg total) every AMand 1 tablet (40 mg total) every PM. Potassium 20 mEq 1 tablet daily.  Labs: 11/27/2017 Creatinine 1.20, BUN 11, Potassium 4.1, Sodium 146, eGFR 44-21 07/21/2017 Creatinine 1.26, BUN 16, Potassium 4.2, Sodium 143, EGFR 42-48 06/22/2017 Creatinine 1.12, BUN 12, Potassium 3.2, Sodium 144, EGFR 48-56 04/21/2017 Creatinine 1.28, BUN 15, Potassium 4.6, Sodium 140, EGFR 41-47 09/19/2016 Creatinine 1.39, BUN 36, Potassium 4.1, Sodium 125, EGFR 37-45 Care Everywhere results 09/13/2016 Creatinine 1.51, BUN 30, Potassium 4.6, Sodium 128, EGFR 34-41 Care Everywhere results 08/31/2016 Creatinine 1.40, BUN 16, Potassium 4.3, Sodium 137, EGFR 36-42 06/09/2016 Creatinine 1.51, BUN 23, Potassium 4.5, Sodium 135  Care Everywhere results  Recommendations: None  Follow-up plan: ICM clinic phone appointment on1/27/2020  Copy of ICM check sent to [place].   3 month ICM trend: 03/01/2018    1 Year ICM trend:       [name], RN 03/01/2018 3:54 PM

## 2018-03-04 DIAGNOSIS — R69 Illness, unspecified: Secondary | ICD-10-CM | POA: Diagnosis not present

## 2018-03-08 ENCOUNTER — Telehealth: Payer: Self-pay | Admitting: Cardiology

## 2018-03-08 NOTE — Telephone Encounter (Signed)
Patient was having little pains yesterday evening. She sent a device transmission. Notified her that the transmission was normal per CMA (documented below)  Patient is under stress/exhaustion from caring full time for her husband with Alzheimer's.   Advised if her symptoms increase in intensity or frequency, she should call us back   Routed to MD as Juluis Rainier

## 2018-03-08 NOTE — Telephone Encounter (Signed)
Patient called and stated that she was having a pinch feeling around the middle of her left breast. No arm numbness. She states that she feels fine today just tired but that is normal for her. After consulting w/ the Device Tech I informed pt that the transmission was normal.

## 2018-03-22 ENCOUNTER — Other Ambulatory Visit: Payer: Self-pay | Admitting: Family Medicine

## 2018-03-23 LAB — CUP PACEART REMOTE DEVICE CHECK
Battery Remaining Longevity: 46 mo
Battery Remaining Percentage: 49 %
Battery Voltage: 2.92 V
Brady Statistic AP VP Percent: 8.7 %
Brady Statistic AP VS Percent: 1 %
Brady Statistic AS VP Percent: 91 %
Brady Statistic AS VS Percent: 1 %
Brady Statistic RA Percent Paced: 8.5 %
Date Time Interrogation Session: 20191125070014
HighPow Impedance: 78 Ohm
HighPow Impedance: 78 Ohm
Implantable Lead Implant Date: 20160406
Implantable Lead Implant Date: 20160406
Implantable Lead Implant Date: 20160406
Implantable Lead Location: 753858
Implantable Lead Location: 753859
Implantable Lead Location: 753860
Implantable Lead Model: 7122
Implantable Pulse Generator Implant Date: 20160406
Lead Channel Impedance Value: 1200 Ohm
Lead Channel Impedance Value: 360 Ohm
Lead Channel Impedance Value: 630 Ohm
Lead Channel Pacing Threshold Amplitude: 0.875 V
Lead Channel Pacing Threshold Amplitude: 1 V
Lead Channel Pacing Threshold Amplitude: 1 V
Lead Channel Pacing Threshold Pulse Width: 0.5 ms
Lead Channel Pacing Threshold Pulse Width: 0.5 ms
Lead Channel Pacing Threshold Pulse Width: 0.5 ms
Lead Channel Sensing Intrinsic Amplitude: 12 mV
Lead Channel Sensing Intrinsic Amplitude: 5 mV
Lead Channel Setting Pacing Amplitude: 2 V
Lead Channel Setting Pacing Amplitude: 2 V
Lead Channel Setting Pacing Amplitude: 2 V
Lead Channel Setting Pacing Pulse Width: 0.5 ms
Lead Channel Setting Pacing Pulse Width: 0.5 ms
Lead Channel Setting Sensing Sensitivity: 0.5 mV
Pulse Gen Serial Number: 7199559

## 2018-03-27 ENCOUNTER — Other Ambulatory Visit: Payer: Self-pay | Admitting: Family Medicine

## 2018-03-27 ENCOUNTER — Telehealth: Payer: Self-pay | Admitting: Family Medicine

## 2018-03-27 NOTE — Telephone Encounter (Signed)
Please advise change medication potassium chloride tablets to capsules

## 2018-03-27 NOTE — Telephone Encounter (Addendum)
Will route to office for final disposition; last office visit 11/27/17; no upcoming visits noted.

## 2018-03-27 NOTE — Telephone Encounter (Signed)
Copied from [place] [id number]. Topic: Quick Communication - Rx Refill/Question >> Mar 27, 2018  7:55 AM Celedonio Savage L wrote: Medication: potassium chloride SA (K-DUR,KLOR-CON) 20 MEQ tablet    pt would like the capsule because the pill is hard to break and it burns her throat its hard for her to swallow a whole pill because its to big  Has the patient contacted their pharmacy? Yes.   (Agent: If no, request that the patient contact the pharmacy for the refill.) (Agent: If yes, when and what did the pharmacy advise?) called yesterday and they advised her to call provider to change from pill to capsule  Preferred Pharmacy (with phone number or street name): [facility], [place] [phone] (Phone) [phone] (Fax)    Agent: Please be advised that RX refills may take up to 3 business days. We ask that you follow-up with your pharmacy.

## 2018-03-29 MED ORDER — POTASSIUM CHLORIDE CRYS ER 20 MEQ PO TBCR: 20.0000 meq | EXTENDED_RELEASE_TABLET | Freq: Two times a day (BID) | ORAL | 1 refills | Status: DC

## 2018-03-29 NOTE — Addendum Note (Signed)
Addended by: Rutherford Guys on: 03/29/2018 09:27 PM   Modules accepted: Orders

## 2018-03-29 NOTE — Telephone Encounter (Signed)
Patient has appt in 4 days with me She has been on KCL and lasix for years Last K 4.2 , crt 1.22, in sept, stable Med refilled

## 2018-04-02 ENCOUNTER — Ambulatory Visit (INDEPENDENT_AMBULATORY_CARE_PROVIDER_SITE_OTHER): Payer: Medicare HMO

## 2018-04-02 ENCOUNTER — Ambulatory Visit: Payer: Medicare HMO | Admitting: Family Medicine

## 2018-04-02 DIAGNOSIS — I5022 Chronic systolic (congestive) heart failure: Secondary | ICD-10-CM

## 2018-04-02 DIAGNOSIS — Z95810 Presence of automatic (implantable) cardiac defibrillator: Secondary | ICD-10-CM

## 2018-04-03 ENCOUNTER — Ambulatory Visit: Payer: Medicare HMO | Admitting: Family Medicine

## 2018-04-05 ENCOUNTER — Telehealth: Payer: Self-pay

## 2018-04-05 DIAGNOSIS — M48062 Spinal stenosis, lumbar region with neurogenic claudication: Secondary | ICD-10-CM | POA: Diagnosis not present

## 2018-04-05 NOTE — Progress Notes (Signed)
EPIC Encounter for ICM Monitoring  Patient Name: [name] is a 78 y.o. female Date: 04/05/2018 Primary Care Physican: Rutherford Guys, MD Primary Cardiologist:Berry Electrophysiologist: Caryl Comes Last Weight:150 lbs Bi-V Pacing: >99%  Attempted call to patient and unable to reach. Transmission reviewed.   Thoracic impedance normal.   Prescribed:Furosemide 40 mg 2 tablets (80 mg total) every AMand 1 tablet (40 mg total) every PM. Potassium 20 mEq 1 tablet daily.  Labs: 11/27/2017 Creatinine 1.20, BUN 11, Potassium 4.1, Sodium 146, eGFR 44-21 07/21/2017 Creatinine 1.26, BUN 16, Potassium 4.2, Sodium 143, EGFR 42-48 06/22/2017 Creatinine 1.12, BUN 12, Potassium 3.2, Sodium 144, EGFR 48-56 04/21/2017 Creatinine 1.28, BUN 15, Potassium 4.6, Sodium 140, EGFR 41-47 09/19/2016 Creatinine 1.39, BUN 36, Potassium 4.1, Sodium 125, EGFR 37-45 Care Everywhere results 09/13/2016 Creatinine 1.51, BUN 30, Potassium 4.6, Sodium 128, EGFR 34-41 Care Everywhere results 08/31/2016 Creatinine 1.40, BUN 16, Potassium 4.3, Sodium 137, EGFR 36-42 06/09/2016 Creatinine 1.51, BUN 23, Potassium 4.5, Sodium 135  Care Everywhere results  Recommendations:Unable to reach.  Follow-up plan: ICM clinic phone appointment on3/05/2018  Copy of ICM check sent to Dr.Klein.   3 month ICM trend: 04/02/2018    1 Year ICM trend:       [name], RN 04/05/2018 4:56 PM

## 2018-04-05 NOTE — Telephone Encounter (Signed)
Remote ICM transmission received.  Attempted call to patient regarding ICM remote transmission and no answer.  

## 2018-04-10 NOTE — Progress Notes (Signed)
Returned call to patient as requested by [employer].   Transmission reviewed. Pt asymptomatic and is doing well.  Advised to call for any fluid accumulation symptoms.  Weight stable at 150.2 lbs.  Next remote scheduled for 05/08/2018.

## 2018-04-11 DIAGNOSIS — R69 Illness, unspecified: Secondary | ICD-10-CM | POA: Diagnosis not present

## 2018-04-26 ENCOUNTER — Encounter: Payer: Self-pay | Admitting: Internal Medicine

## 2018-04-26 DIAGNOSIS — R69 Illness, unspecified: Secondary | ICD-10-CM | POA: Diagnosis not present

## 2018-04-30 ENCOUNTER — Encounter

## 2018-04-30 ENCOUNTER — Ambulatory Visit (INDEPENDENT_AMBULATORY_CARE_PROVIDER_SITE_OTHER): Payer: Medicare HMO | Admitting: *Deleted

## 2018-04-30 DIAGNOSIS — I428 Other cardiomyopathies: Secondary | ICD-10-CM

## 2018-05-01 LAB — CUP PACEART REMOTE DEVICE CHECK
Battery Remaining Longevity: 43 mo
Battery Remaining Percentage: 46 %
Battery Voltage: 2.92 V
Brady Statistic AP VP Percent: 8.9 %
Brady Statistic AP VS Percent: 1 %
Brady Statistic AS VP Percent: 91 %
Brady Statistic AS VS Percent: 1 %
Brady Statistic RA Percent Paced: 8.8 %
Date Time Interrogation Session: 20200224122823
HighPow Impedance: 74 Ohm
HighPow Impedance: 74 Ohm
Implantable Lead Implant Date: 20160406
Implantable Lead Implant Date: 20160406
Implantable Lead Implant Date: 20160406
Implantable Lead Location: 753858
Implantable Lead Location: 753859
Implantable Lead Location: 753860
Implantable Lead Model: 7122
Implantable Pulse Generator Implant Date: 20160406
Lead Channel Impedance Value: 1375 Ohm
Lead Channel Impedance Value: 380 Ohm
Lead Channel Impedance Value: 680 Ohm
Lead Channel Pacing Threshold Amplitude: 0.875 V
Lead Channel Pacing Threshold Amplitude: 1 V
Lead Channel Pacing Threshold Amplitude: 1.25 V
Lead Channel Pacing Threshold Pulse Width: 0.5 ms
Lead Channel Pacing Threshold Pulse Width: 0.5 ms
Lead Channel Pacing Threshold Pulse Width: 0.5 ms
Lead Channel Sensing Intrinsic Amplitude: 12 mV
Lead Channel Sensing Intrinsic Amplitude: 5 mV
Lead Channel Setting Pacing Amplitude: 2 V
Lead Channel Setting Pacing Amplitude: 2 V
Lead Channel Setting Pacing Amplitude: 2.25 V
Lead Channel Setting Pacing Pulse Width: 0.5 ms
Lead Channel Setting Pacing Pulse Width: 0.5 ms
Lead Channel Setting Sensing Sensitivity: 0.5 mV
Pulse Gen Serial Number: 7199559

## 2018-05-04 ENCOUNTER — Ambulatory Visit: Payer: Medicare HMO | Admitting: Family Medicine

## 2018-05-04 ENCOUNTER — Telehealth: Payer: Self-pay | Admitting: Family Medicine

## 2018-05-04 NOTE — Telephone Encounter (Signed)
I have called the pt and informed her that Dr. Pamella Pert wants to see her and the appointment was offered 05/15/2018 @ 11am  SAME DAY slot on same day.   Pt took that appointment and I placed it on hold. 05/08/18 appointment is to be canceled with Dr. Carlota Raspberry

## 2018-05-04 NOTE — Telephone Encounter (Signed)
Copied from [place] [id number]. Topic: General - Other >> May 04, 2018  9:28 AM Antonieta Iba C wrote: Reason for CRM: pt called in to be advised. Pt has an apt today with PCP. 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 Pt would like to know if she absolutely have to come to this apt? Pt says that it is difficult for her at this time. I did advise pt that her last ov with PCP was in Sept and that I am pretty certain that apt is needed in order to continue medication refills. Pt still asked that I double check with PCP.    ALSO pt says that she need a new blood sugar meter sent in to her pharmacy  Pharmacy: [facility], [place] [phone] (Phone) [phone] (Fax)

## 2018-05-05 ENCOUNTER — Ambulatory Visit (INDEPENDENT_AMBULATORY_CARE_PROVIDER_SITE_OTHER): Payer: Medicare HMO

## 2018-05-05 DIAGNOSIS — Z794 Long term (current) use of insulin: Secondary | ICD-10-CM

## 2018-05-05 DIAGNOSIS — N183 Chronic kidney disease, stage 3 unspecified: Secondary | ICD-10-CM

## 2018-05-05 DIAGNOSIS — E1122 Type 2 diabetes mellitus with diabetic chronic kidney disease: Secondary | ICD-10-CM | POA: Diagnosis not present

## 2018-05-05 DIAGNOSIS — E039 Hypothyroidism, unspecified: Secondary | ICD-10-CM

## 2018-05-07 LAB — COMPREHENSIVE METABOLIC PANEL
ALT: 10 IU/L (ref 0–32)
AST: 17 IU/L (ref 0–40)
Albumin/Globulin Ratio: 2 (ref 1.2–2.2)
Albumin: 4.4 g/dL (ref 3.7–4.7)
Alkaline Phosphatase: 86 IU/L (ref 39–117)
BUN/Creatinine Ratio: 14 (ref 12–28)
BUN: 19 mg/dL (ref 8–27)
Bilirubin Total: 0.4 mg/dL (ref 0.0–1.2)
CO2: 26 mmol/L (ref 20–29)
Calcium: 9.6 mg/dL (ref 8.7–10.3)
Chloride: 104 mmol/L (ref 96–106)
Creatinine, Ser: 1.36 mg/dL — ABNORMAL HIGH (ref 0.57–1.00)
GFR calc Af Amer: 44 mL/min/{1.73_m2} — ABNORMAL LOW (ref >59)
GFR calc non Af Amer: 38 mL/min/{1.73_m2} — ABNORMAL LOW (ref >59)
Globulin, Total: 2.2 g/dL (ref 1.5–4.5)
Glucose: 96 mg/dL (ref 65–99)
Potassium: 3.9 mmol/L (ref 3.5–5.2)
Sodium: 146 mmol/L — ABNORMAL HIGH (ref 134–144)
Total Protein: 6.6 g/dL (ref 6.0–8.5)

## 2018-05-07 LAB — LIPID PANEL
Chol/HDL Ratio: 2.7 ratio (ref 0.0–4.4)
Cholesterol, Total: 130 mg/dL (ref 100–199)
HDL: 49 mg/dL (ref >39)
LDL Calculated: 68 mg/dL (ref 0–99)
Triglycerides: 67 mg/dL (ref 0–149)
VLDL Cholesterol Cal: 13 mg/dL (ref 5–40)

## 2018-05-07 LAB — HEMOGLOBIN A1C
Est. average glucose Bld gHb Est-mCnc: 123 mg/dL
Hgb A1c MFr Bld: 5.9 % — ABNORMAL HIGH (ref 4.8–5.6)

## 2018-05-07 LAB — TSH: TSH: 1.55 u[IU]/mL (ref 0.450–4.500)

## 2018-05-08 ENCOUNTER — Ambulatory Visit (INDEPENDENT_AMBULATORY_CARE_PROVIDER_SITE_OTHER): Payer: Medicare HMO

## 2018-05-08 ENCOUNTER — Encounter: Payer: Self-pay | Admitting: Cardiology

## 2018-05-08 ENCOUNTER — Encounter

## 2018-05-08 ENCOUNTER — Ambulatory Visit: Payer: Medicare HMO | Admitting: Family Medicine

## 2018-05-08 DIAGNOSIS — I5022 Chronic systolic (congestive) heart failure: Secondary | ICD-10-CM

## 2018-05-08 DIAGNOSIS — Z95810 Presence of automatic (implantable) cardiac defibrillator: Secondary | ICD-10-CM

## 2018-05-08 NOTE — Progress Notes (Signed)
Remote ICD transmission.   

## 2018-05-09 NOTE — Progress Notes (Signed)
EPIC Encounter for ICM Monitoring  Patient Name: [name] is a 78 y.o. female Date: 05/09/2018 Primary Care Physican: Rutherford Guys, MD Primary Cardiologist:Berry Electrophysiologist: Caryl Comes LastWeight:150 lbs Bi-V Pacing: >99%  Heart failure questions reviewed and patient asymptomatic.  Thoracic impedance normal.   Prescribed:Furosemide 40 mg 2 tablets (80 mg total) every AMand 1 tablet (40 mg total) every PM. Potassium 20 mEq 1 tablet daily.  Labs: 05/05/2018 Creatinine 1.36, BUN 19, Potassium 3.9, Sodium 146, GFR 38-44 11/27/2017 Creatinine 1.20, BUN 11, Potassium 4.1, Sodium 146, GFR 44-21 07/21/2017 Creatinine 1.26, BUN 16, Potassium 4.2, Sodium 143, GFR 42-48 06/22/2017 Creatinine 1.12, BUN 12, Potassium 3.2, Sodium 144, GFR 48-56 04/21/2017 Creatinine 1.28, BUN 15, Potassium 4.6, Sodium 140, GFR 41-47 A complete set of results can be found in Results Review.  Recommendations:No changes and encouraged to call for fluid symptoms.   Follow-up plan: ICM clinic phone appointment on4/28/2020.   Office appt 05/30/2018 with Dr. Caryl Comes.    Copy of ICM check sent to Dr. Caryl Comes.   3 month ICM trend: 05/08/2018    1 Year ICM trend:       [name], RN 05/09/2018 9:52 AM

## 2018-05-15 ENCOUNTER — Telehealth: Payer: Self-pay

## 2018-05-15 ENCOUNTER — Other Ambulatory Visit: Payer: Self-pay

## 2018-05-15 ENCOUNTER — Telehealth: Payer: Self-pay | Admitting: Family Medicine

## 2018-05-15 ENCOUNTER — Ambulatory Visit (INDEPENDENT_AMBULATORY_CARE_PROVIDER_SITE_OTHER): Payer: Medicare HMO | Admitting: Family Medicine

## 2018-05-15 ENCOUNTER — Encounter: Payer: Self-pay | Admitting: Family Medicine

## 2018-05-15 VITALS — BP 102/63 | HR 71 | Temp 98.0°F | Resp 16 | Ht 66.0 in | Wt 149.0 lb

## 2018-05-15 DIAGNOSIS — E039 Hypothyroidism, unspecified: Secondary | ICD-10-CM

## 2018-05-15 DIAGNOSIS — N183 Chronic kidney disease, stage 3 unspecified: Secondary | ICD-10-CM

## 2018-05-15 DIAGNOSIS — Z794 Long term (current) use of insulin: Secondary | ICD-10-CM | POA: Diagnosis not present

## 2018-05-15 DIAGNOSIS — E876 Hypokalemia: Secondary | ICD-10-CM | POA: Diagnosis not present

## 2018-05-15 DIAGNOSIS — L309 Dermatitis, unspecified: Secondary | ICD-10-CM | POA: Diagnosis not present

## 2018-05-15 DIAGNOSIS — R69 Illness, unspecified: Secondary | ICD-10-CM | POA: Diagnosis not present

## 2018-05-15 DIAGNOSIS — E1122 Type 2 diabetes mellitus with diabetic chronic kidney disease: Secondary | ICD-10-CM | POA: Diagnosis not present

## 2018-05-15 DIAGNOSIS — R82998 Other abnormal findings in urine: Secondary | ICD-10-CM | POA: Diagnosis not present

## 2018-05-15 DIAGNOSIS — J3089 Other allergic rhinitis: Secondary | ICD-10-CM

## 2018-05-15 LAB — POCT URINALYSIS DIP (MANUAL ENTRY)
Bilirubin, UA: NEGATIVE
Blood, UA: NEGATIVE
Glucose, UA: NEGATIVE mg/dL
Ketones, POC UA: NEGATIVE mg/dL
Nitrite, UA: NEGATIVE
Protein Ur, POC: NEGATIVE mg/dL
Spec Grav, UA: 1.015 (ref 1.010–1.025)
Urobilinogen, UA: 0.2 E.U./dL
pH, UA: 7 (ref 5.0–8.0)

## 2018-05-15 LAB — POC MICROSCOPIC URINALYSIS (UMFC): Mucus: ABSENT

## 2018-05-15 MED ORDER — TRIAMCINOLONE ACETONIDE 0.1 % EX CREA: 1.0000 "application " | TOPICAL_CREAM | Freq: Two times a day (BID) | CUTANEOUS | 0 refills | Status: DC

## 2018-05-15 MED ORDER — TRIAMCINOLONE ACETONIDE 55 MCG/ACT NA AERO: 2.0000 | INHALATION_SPRAY | Freq: Every day | NASAL | 12 refills | Status: DC

## 2018-05-15 MED ORDER — BLOOD GLUCOSE MONITOR KIT: PACK | 11 refills | Status: DC

## 2018-05-15 NOTE — Telephone Encounter (Signed)
Copied from [place] [phone]. Topic: Quick Communication - See Telephone Encounter >> May 15, 2018  4:36 PM Rutherford Nail, NT wrote: CRM for notification. See Telephone encounter for: 05/15/18. Patient calling and states that she was seen today and Dr Pamella Pert was going to send in a new potassium medication because the one she currently takes "sets her throat on fire." Also states that the pharmacy has not received the new blood glucose meter kit. Please advise. [place], [place]

## 2018-05-15 NOTE — Progress Notes (Signed)
3/10/202012:23 PM  [name] 1940/10/09, 78 y.o. female [id number]  Chief Complaint  Patient presents with  . Chronic Conditions    6 month follow-up     HPI:   Patient is a 78 y.o. female with past medical history significant for DM2 on insulin complicated by recent osteomyelitis, hypothyroidism, anxiety and depression, CHF, CKD3, CAD, HLP, HTN,OSA who presents today for routine followup  pmp revewed meds rx by other providers  Having itchy rash in her ears, vaseline not helping Runny nose, chronic, atrovent nasal spray helps, but too drying, getting sores Needs KCL to be changed to be capsule as tablets too big and when she cuts then in half they are very irritating Needs new DM meter, hers is not reading right Denies any recurring lows, she does get sx Last episode several weeks ago at the grocery store, prior ver a year ago levemir 20 units, hardly every uses novolog No more foot ulcers Not seeing foot doctor anymore  She is wondering about having a UTI, her urine is dark, smells a bit strong and having achy back.  She normally has back pain  Sees Dr Caryl Comes on the 25th of this month  Lab Results  Component Value Date   HGBA1C 5.9 (H) 05/05/2018   HGBA1C 6.1 (A) 11/27/2017   HGBA1C 6.4 07/21/2017   Lab Results  Component Value Date   LDLCALC 68 05/05/2018   CREATININE 1.36 (H) 05/05/2018   Lab Results  Component Value Date   CREATININE 1.36 (H) 05/05/2018   CREATININE 1.20 (H) 11/27/2017   CREATININE 1.26 (H) 07/21/2017    Lab Results  Component Value Date   TSH 1.550 05/05/2018    Fall Risk  05/15/2018 11/27/2017 07/21/2017 06/22/2017 05/22/2017  Falls in the past year? 0 No Yes Yes No  Number falls in past yr: - - 1 2 or more -  Injury with Fall? 0 - No No -  Risk for fall due to : - - - - -  Follow up - - - - -     Depression screen [facility] 2/9 05/15/2018 11/27/2017 05/22/2017  Decreased Interest 2 0 0  Down, Depressed, Hopeless 2 0 0  PHQ - 2 Score 4  0 0  Altered sleeping 2 - -  Tired, decreased energy 2 - -  Change in appetite 2 - -  Feeling bad or failure about yourself  2 - -  Trouble concentrating 1 - -  Moving slowly or fidgety/restless 0 - -  Suicidal thoughts 0 - -  PHQ-9 Score 13 - -  Some recent data might be hidden    Allergies  Allergen Reactions  . Valium [Diazepam] Swelling    face    Prior to Admission medications   Medication Sig Start Date End Date Taking? Authorizing Provider  ALPRAZolam Duanne Moron) 1 MG tablet Take 1 mg by mouth daily as needed. 04/25/18  Yes [provider]  aspirin EC 81 MG tablet Take 81 mg by mouth at bedtime.   Yes [provider]  blood glucose meter kit and supplies KIT Dispense based on patient and insurance preference. Use three times a day as directed. Dx. E11.65, Z79.4 06/06/17  Yes Rutherford Guys, MD  carvedilol (COREG) 12.5 MG tablet TAKE 1 & 1/2 (ONE & ONE-HALF) TABLETS BY MOUTH TWICE DAILY WITH A MEAL 12/15/17  Yes Deboraha Sprang, MD  clopidogrel (PLAVIX) 75 MG tablet TAKE 1 TABLET BY MOUTH ONCE DAILY 02/12/18  Yes Gwenlyn Found,  Pearletha Forge, MD  diclofenac sodium (VOLTAREN) 1 % GEL Apply 2 g topically 4 (four) times daily. 05/22/17  Yes Rutherford Guys, MD  furosemide (LASIX) 40 MG tablet TAKE 2 TABLETS BY MOUTH ONCE DAILY IN THE MORNING AND 1 ONCE DAILY IN THE EVENING 01/31/18  Yes Lorretta Harp, MD  Influenza vac split quadrivalent PF (FLUZONE HIGH-DOSE) 0.5 ML injection Fluzone High-Dose 2015-16 (PF) 180 mcg/0.5 mL intramuscular syringe   Yes [provider]  insulin aspart (NOVOLOG FLEXPEN) 100 UNIT/ML FlexPen Per insulin sliding scale, max TTD, 20 units. 06/26/17  Yes Rutherford Guys, MD  Insulin Detemir (LEVEMIR) 100 UNIT/ML Pen Inject 20 Units into the skin daily at 10 pm. 10/25/17  Yes Rutherford Guys, MD  Insulin Pen Needle 32G X 6 MM MISC Use new needle for each injection of insulin, four times a day. Dx E11.9, Z79.4 06/26/17  Yes Rutherford Guys, MD    levothyroxine (SYNTHROID, LEVOTHROID) 88 MCG tablet TAKE 1 TABLET BY MOUTH AT BEDTIME 03/23/18  Yes Rutherford Guys, MD  lisinopril (PRINIVIL,ZESTRIL) 2.5 MG tablet Take by mouth. 03/25/16  Yes [provider]  MELATONIN PO Take 2 tablets by mouth at bedtime.   Yes [provider]  mirtazapine (REMERON) 30 MG tablet Take 1 tablet (30 mg total) by mouth at bedtime. 11/27/17  Yes Rutherford Guys, MD  mupirocin ointment (BACTROBAN) 2 % Apply 1 application topically 2 (two) times daily. 07/21/17  Yes [name], DPM  nitroGLYCERIN (NITROSTAT) 0.4 MG SL tablet Place 1 tablet (0.4 mg total) under the tongue every 5 (five) minutes as needed for chest pain. 05/01/13  Yes Brett Canales, PA-C  ONE TOUCH ULTRA TEST test strip  06/07/17  Yes [provider]  Jonetta Speak LANCETS [address] [place]  06/07/17  Yes [provider]  oxyCODONE-acetaminophen (PERCOCET/ROXICET) 5-325 MG tablet Take 1 tablet by mouth every 8 (eight) hours as needed for severe pain.    Yes [provider]  pantoprazole (PROTONIX) 40 MG tablet Take 40 mg by mouth 2 (two) times daily.  04/30/13  Yes [provider]  potassium chloride SA (K-DUR,KLOR-CON) 20 MEQ tablet Take 1 tablet (20 mEq total) by mouth 2 (two) times daily. 03/29/18  Yes Rutherford Guys, MD  simvastatin (ZOCOR) 20 MG tablet TAKE 1 TABLET BY MOUTH ONCE DAILY WITH  SUPPER 03/27/18  Yes Rutherford Guys, MD    Past Medical History:  Diagnosis Date  . Anxiety   . Arthritis   . Chronic pain    a. Prior h/o chronic pain on methadone.  . Chronic systolic CHF (congestive heart failure) (HCC)    a. mixed ischemic/non-ischemic cardiomyopathy. b. s/p CRT-D in 06/2014.  Marland Kitchen CKD (chronic kidney disease), stage III ([place])   . Complication of anesthesia    hard time waking her up from general surgery  . Coronary artery disease    a. remote RCA stenting in 2008 with non-DES. b. Cath 06/2014 following abnormal nuc: Stable, unchanged  from prior cath, patent stent and 40% LM.  . Diabetes mellitus ([place])   . GERD (gastroesophageal reflux disease)   . Headache   . Hyperlipidemia   . Hypertension   . LBBB (left bundle branch block)   . Nonischemic cardiomyopathy ([place])   . OSA (obstructive sleep apnea)    AHI-9.77/hr, during REM-50.32/hr  . Tobacco abuse     Past Surgical History:  Procedure Laterality Date  . BACK SURGERY  2013  .  BI-VENTRICULAR IMPLANTABLE CARDIOVERTER DEFIBRILLATOR N/A 06/11/2014   STJ CRTD implanted by Dr Caryl Comes  . CARDIAC CATHETERIZATION  12/14/2006   RCA stented with a 3.0 [employer] Liberte stent resulting in a reduction of 75% to 0% residual  . CHOLECYSTECTOMY     20 years ago  . COLONOSCOPY WITH PROPOFOL N/A 12/15/2014   Procedure: COLONOSCOPY WITH PROPOFOL;  Surgeon: Clarene Essex, MD;  Location: [facility] ENDOSCOPY;  Service: Endoscopy;  Laterality: N/A;  . EYE SURGERY     bilateral cataract surgery   . LEFT AND RIGHT HEART CATHETERIZATION WITH CORONARY ANGIOGRAM N/A 05/29/2014   Procedure: LEFT AND RIGHT HEART CATHETERIZATION WITH CORONARY ANGIOGRAM;  Surgeon: Lorretta Harp, MD;  Location: [facility] CATH LAB;  Service: Cardiovascular;  Laterality: N/A;  . LEFT HEART CATHETERIZATION WITH CORONARY ANGIOGRAM N/A 12/27/2012   Procedure: LEFT HEART CATHETERIZATION WITH CORONARY ANGIOGRAM;  Surgeon: Lorretta Harp, MD;  Location: [facility] CATH LAB;  Service: Cardiovascular;  Laterality: N/A;    Social History   Tobacco Use  . Smoking status: Current Every Day Smoker    Packs/day: 2.00    Years: 58.00    Pack years: 116.00    Types: Cigarettes    Start date: 12/26/1951    Last attempt to quit: 12/05/2013    Years since quitting: 4.4  . Smokeless tobacco: Never Used  . Tobacco comment: uses Vape cigarettes  Substance Use Topics  . Alcohol use: No    Alcohol/week: 0.0 standard drinks    Family History  Problem Relation Age of Onset  . Stroke Mother   . Hypertension Mother   . Coronary artery  disease Father   . Stroke Brother   . Heart disease Brother   . Other Brother        H1N1 VIRUS  . Healthy Sister   . Healthy Sister     ROS Per hpi  OBJECTIVE:  Blood pressure 102/63, pulse 71, temperature 98 F (36.7 C), temperature source Oral, resp. rate 16, height '5\' 6"'  (1.676 m), weight 149 lb (67.6 kg), SpO2 98 %. Body mass index is 24.05 kg/m.   Physical Exam Vitals signs and nursing note reviewed.  Constitutional:      Appearance: She is well-developed.  HENT:     Head: Normocephalic and atraumatic.     Mouth/Throat:     Pharynx: No oropharyngeal exudate.  Eyes:     General: No scleral icterus.    Conjunctiva/sclera: Conjunctivae normal.     Pupils: Pupils are equal, round, and reactive to light.  Neck:     Musculoskeletal: Neck supple.  Cardiovascular:     Rate and Rhythm: Normal rate and regular rhythm.     Heart sounds: Normal heart sounds. No murmur. No friction rub. No gallop.   Pulmonary:     Effort: Pulmonary effort is normal.     Breath sounds: Normal breath sounds. No wheezing or rales.  Skin:    General: Skin is warm and dry.     Findings: Rash (scaly erythematous macular rash along pinna) present.  Neurological:     Mental Status: She is alert and oriented to person, place, and time.     Results for orders placed or performed in visit on 05/15/18 (from the past 24 hour(s))  POCT urinalysis dipstick     Status: Abnormal   Collection Time: 05/15/18 12:59 PM  Result Value Ref Range   Color, UA yellow yellow   Clarity, UA clear clear   Glucose, UA negative negative mg/dL  Bilirubin, UA negative negative   Ketones, POC UA negative negative mg/dL   Spec Grav, UA 1.015 1.010 - 1.025   Blood, UA negative negative   pH, UA 7.0 5.0 - 8.0   Protein Ur, POC negative negative mg/dL   Urobilinogen, UA 0.2 0.2 or 1.0 E.U./dL   Nitrite, UA Negative Negative   Leukocytes, UA Small (1+) (A) Negative  POCT Microscopic Urinalysis ([facility])     Status: None    Collection Time: 05/15/18  1:49 PM  Result Value Ref Range   WBC,UR,HPF,POC None None WBC/hpf   RBC,UR,HPF,POC None None RBC/hpf   Bacteria None None, Too numerous to count   Mucus Absent Absent   Epithelial Cells, UR Per Microscopy None None, Too numerous to count cells/hpf    ASSESSMENT and PLAN  1. Type 2 diabetes mellitus with stage 3 chronic kidney disease, with long-term current use of insulin (HCC) Controlled. Continue current regime.  - Microalbumin/Creatinine Ratio, Urine  2. Hypothyroidism, unspecified type Controlled. Continue current regime.   3. Hypokalemia Per insurance, does not cover capsules.   4. Non-seasonal allergic rhinitis, unspecified trigger Did not tolerate atrovent. Will do trial of nasacort.   5. Dermatitis Triamcinolone rx given. Reviewed r/se/b  6. Dark urine No signs of infection. Push more water - POCT urinalysis dipstick - POCT Microscopic Urinalysis ([facility])  Other orders - blood glucose meter kit and supplies KIT; Dispense based on patient and insurance preference. Use three times a day as directed. Dx. E11.65, Z79.4 - triamcinolone (NASACORT) 55 MCG/ACT AERO nasal inhaler; Place 2 sprays into the nose daily. - triamcinolone cream (KENALOG) 0.1 %; Apply 1 application topically 2 (two) times daily.  Return in about 3 months (around 08/15/2018).    Rutherford Guys, MD [facility] at [place], [place] [id number] Ph.  [phone] Fax [phone]

## 2018-05-15 NOTE — Telephone Encounter (Signed)
Spoke with [name] regarding K+ 40 mEq tablet.  Advised pt per pt she is unable to swallow the 40 mEq tablet and is looking for a smaller tablet to take.  Per [employer] will cover  K+, can try 20 mEq twice a day.  Also pt may want to speak with pharmacy to see if another manufacturer may have an enteric coated/smoother to swallow tablet. He advises if it is K+ it is covered by pt plan. Dgaddy, CMA

## 2018-05-15 NOTE — Patient Instructions (Signed)
° ° ° °  If you have lab work done today you will be contacted with your lab results within the next 2 weeks.  If you have not heard from us then please contact us. The fastest way to get your results is to register for My Chart. ° ° °IF you received an x-ray today, you will receive an invoice from [facility]. Please contact [facility] at [phone] with questions or concerns regarding your invoice.  ° °IF you received labwork today, you will receive an invoice from [facility]. Please contact [facility] at [phone] with questions or concerns regarding your invoice.  ° °Our billing staff will not be able to assist you with questions regarding bills from these companies. ° °You will be contacted with the lab results as soon as they are available. The fastest way to get your results is to activate your My Chart account. Instructions are located on the last page of this paperwork. If you have not heard from us regarding the results in 2 weeks, please contact this office. °  ° ° ° °

## 2018-05-16 DIAGNOSIS — R69 Illness, unspecified: Secondary | ICD-10-CM | POA: Diagnosis not present

## 2018-05-16 LAB — MICROALBUMIN / CREATININE URINE RATIO
Creatinine, Urine: 24.4 mg/dL
Microalb/Creat Ratio: <12 mg/g creat (ref 0–29)
Microalbumin, Urine: <3 ug/mL

## 2018-05-16 NOTE — Telephone Encounter (Signed)
Meter sent on yesterday.  See 05/15/2018 telephone note re: K+.   Please advise. Dgaddy, CMA

## 2018-05-17 NOTE — Telephone Encounter (Signed)
Please call patient and discuss potassium supplement. thanks

## 2018-05-19 DIAGNOSIS — R69 Illness, unspecified: Secondary | ICD-10-CM | POA: Diagnosis not present

## 2018-05-21 ENCOUNTER — Encounter: Payer: Self-pay | Admitting: Internal Medicine

## 2018-05-24 ENCOUNTER — Telehealth: Payer: Self-pay

## 2018-05-24 NOTE — Telephone Encounter (Signed)
Spoke with pt to assess her needs. She states she is feeling well and does not want to come into the office for her routine follow up due to COVID-19 outbreak. She denies CP, SOB, edema or palpitations. Her last remote looked normal. She understands her device will still be transmitting remotely. She does not need any refills at this time. She understands she may call the office with any additional needs until she is rescheduled.

## 2018-05-29 ENCOUNTER — Encounter: Payer: Medicare HMO | Admitting: Internal Medicine

## 2018-05-30 ENCOUNTER — Encounter: Payer: Medicare HMO | Admitting: Internal Medicine

## 2018-05-30 ENCOUNTER — Ambulatory Visit: Payer: Self-pay

## 2018-05-30 ENCOUNTER — Telehealth: Payer: Self-pay | Admitting: Family Medicine

## 2018-05-30 NOTE — Telephone Encounter (Signed)
Patient called in and says she was visiting her daughter in the hospital with pneumonia until they restricted visiting. She says a couple of days ago, she started coughing, not a bad cough, coughing up white phlegm with a little yellow at times. She denies fever, chills, body aches. She denies difficulty breathing. She says she's been taking precautions as much as she can, but wants to know if she should be tested for COVID-19. I advised no testing unless there are respiratory symptoms of shortness of breath, difficulty breathing. She denies again and denies other symptoms. Home care advice given, low risk COVID-19 exposure care advice given, patient verbalized understanding.  Reason for Disposition . Cough  Answer Assessment - Initial Assessment Questions 1. ONSET: "When did the cough begin?"      Couple of days ago 2. SEVERITY: "How bad is the cough today?"      Not bad 3. RESPIRATORY DISTRESS: "Describe your breathing."      No difficulty 4. FEVER: "Do you have a fever?" If so, ask: "What is your temperature, how was it measured, and when did it start?"     No 5. SPUTUM: "Describe the color of your sputum" (clear, white, yellow, green)     White with a little yellow, but not much yellow 6. HEMOPTYSIS: "Are you coughing up any blood?" If so ask: "How much?" (flecks, streaks, tablespoons, etc.)     No 7. CARDIAC HISTORY: "Do you have any history of heart disease?" (e.g., heart attack, congestive heart failure)      Yes 8. LUNG HISTORY: "Do you have any history of lung disease?"  (e.g., pulmonary embolus, asthma, emphysema)     No 9. PE RISK FACTORS: "Do you have a history of blood clots?" (or: recent major surgery, recent prolonged travel, bedridden)     No 10. OTHER SYMPTOMS: "Do you have any other symptoms?" (e.g., runny nose, wheezing, chest pain)     No 11. PREGNANCY: "Is there any chance you are pregnant?" "When was your last menstrual period?"     No 12. TRAVEL: "Have you traveled  out of the country in the last month?" (e.g., travel history, exposures)     No  Protocols used: [place]

## 2018-05-30 NOTE — Telephone Encounter (Signed)
Patient states she has a swallowing problem almost choked to death on her potassium pills  She needs another form of potassium sent in that she can digest   [facility] [address] in [place]

## 2018-05-30 NOTE — Telephone Encounter (Signed)
We tried to address at last OV. Nikki called pharmacy and per what she found out this is only covered formulation for potassium. Can we call pharmacy again and confirm this, feel free to give verbal order for a different smaller formulation. thanks

## 2018-05-30 NOTE — Telephone Encounter (Signed)
Please see message below

## 2018-06-05 ENCOUNTER — Telehealth: Payer: Self-pay

## 2018-06-05 NOTE — Telephone Encounter (Signed)
I tried to call patient to set up virtual visit for tomorrow or Thursday. Will try to call back later today.

## 2018-06-08 NOTE — Telephone Encounter (Signed)
Call to pharmacy. Per pharmacy, the type of K pill pt has can be either broken in half or dissolved in water.  Advised pt who has been choking even when halving pill but she agreed to try to dissolve it.  Pt advised to c/b to clinic if she has additional difficulty or with any questions.

## 2018-06-12 ENCOUNTER — Other Ambulatory Visit: Payer: Self-pay | Admitting: Internal Medicine

## 2018-06-12 DIAGNOSIS — I2510 Atherosclerotic heart disease of native coronary artery without angina pectoris: Secondary | ICD-10-CM

## 2018-06-14 DIAGNOSIS — R69 Illness, unspecified: Secondary | ICD-10-CM | POA: Diagnosis not present

## 2018-06-16 ENCOUNTER — Other Ambulatory Visit: Payer: Self-pay | Admitting: Family Medicine

## 2018-06-16 NOTE — Telephone Encounter (Signed)
Please advise, is pt suppose to continue to use

## 2018-06-18 NOTE — Telephone Encounter (Signed)
Patient stated her ear cleared up after using the cream.  But has come back and she has lost the original prescription she picked up and would like a new one called in.

## 2018-06-25 ENCOUNTER — Other Ambulatory Visit: Payer: Self-pay | Admitting: Family Medicine

## 2018-06-25 NOTE — Telephone Encounter (Signed)
Labs reviewed by PCP Requested Prescriptions  Pending Prescriptions Disp Refills  . simvastatin (ZOCOR) 20 MG tablet [Pharmacy Med Name: Simvastatin 20 MG Oral Tablet] 90 tablet 3    Sig: TAKE 1 TABLET BY MOUTH ONCE DAILY WITH SUPPER     Cardiovascular:  Antilipid - Statins Passed - 06/25/2018  9:43 AM      Passed - Total Cholesterol in normal range and within 360 days    Cholesterol, Total  Date Value Ref Range Status  05/05/2018 130 100 - 199 mg/dL Final         Passed - LDL in normal range and within 360 days    LDL Calculated  Date Value Ref Range Status  05/05/2018 68 0 - 99 mg/dL Final         Passed - HDL in normal range and within 360 days    HDL  Date Value Ref Range Status  05/05/2018 49 >39 mg/dL Final         Passed - Triglycerides in normal range and within 360 days    Triglycerides  Date Value Ref Range Status  05/05/2018 67 0 - 149 mg/dL Final         Passed - Patient is not pregnant      Passed - Valid encounter within last 12 months    Recent Outpatient Visits          1 month ago Type 2 diabetes mellitus with stage 3 chronic kidney disease, with long-term current use of insulin ([place])   [facility], Lilia Argue, MD   7 months ago Type 2 diabetes mellitus with stage 3 chronic kidney disease, with long-term current use of insulin ([place])   [facility], Lilia Argue, MD   11 months ago Type 2 diabetes mellitus with stage 3 chronic kidney disease, with long-term current use of insulin [facility])   [facility], Lilia Argue, MD   1 year ago Type 2 diabetes mellitus with stage 3 chronic kidney disease, with long-term current use of insulin [facility])   [facility], Lilia Argue, MD   1 year ago Ulcer of right foot, unspecified ulcer stage [facility])   [facility], Lilia Argue, MD

## 2018-06-26 ENCOUNTER — Other Ambulatory Visit: Payer: Self-pay

## 2018-06-26 ENCOUNTER — Telehealth (INDEPENDENT_AMBULATORY_CARE_PROVIDER_SITE_OTHER): Payer: Medicare HMO | Admitting: Registered Nurse

## 2018-06-26 DIAGNOSIS — J029 Acute pharyngitis, unspecified: Secondary | ICD-10-CM

## 2018-06-26 MED ORDER — AMOXICILLIN 500 MG PO CAPS: 500.0000 mg | ORAL_CAPSULE | Freq: Two times a day (BID) | ORAL | 0 refills | Status: AC

## 2018-06-26 NOTE — Progress Notes (Signed)
Telemedicine Encounter- SOAP NOTE Established Patient  This telephone encounter was conducted with the patient's (or proxy's) verbal consent via audio telecommunications: Yes Patient was instructed to have this encounter in a suitably private space; and to only have persons present to whom they give permission to participate. In addition, patient identity was confirmed by use of name plus two identifiers (DOB and address).  I discussed the limitations, risks, security and privacy concerns of performing an evaluation and management service by telephone and the availability of in person appointments. I also discussed with the patient that there may be a patient responsible charge related to this service. The patient expressed understanding and agreed to proceed.  I spent a total of 18 minutes talking with the patient or their proxy.  CC: Sxs of Acute Pharyngitis  Subjective   [name] is a 78 y.o. established patient. Telephone visit today for  Pt c/o acute onset L sided pharyngeal pain and swollen sensation, which she states may be a lymph node. Onset 3 days prior to visit. Symptoms are intermittent. Pt reports she is nonfebrile. Denies systemic symptoms. Pt has found peroxide/water gargle to help alleviate symptoms. Denies alleviating factors.   Pt has hx significant for HTN, HLD, LBBB, CHF, CKD Stage IIIB, T2DM (well controlled), and tobacco abuse, among other items.      Patient Active Problem List   Diagnosis Date Noted  . Adjustment reaction with anxiety and depression 04/30/2017  . Diabetic peripheral neuropathy associated with type 2 diabetes mellitus ([place]) 04/30/2017  . Hypothyroidism 04/29/2017  . DJD (degenerative joint disease), lumbar 04/29/2017  . Obesity 02/15/2017  . History of laryngeal cancer 06/09/2016  . S/P coronary artery stent placement 05/30/2016  . Tachycardia 05/26/2015  . Syncope 05/26/2015  . ICD (implantable cardioverter-defibrillator),  biventricular, in situ 05/26/2015  . Chronic kidney disease, stage III (moderate) ([place]) 04/10/2015  . Esophagitis, reflux 04/10/2015  . Vitamin D deficiency 04/10/2015  . Cough 03/10/2015  . Chronic pain   . Chronic systolic CHF (congestive heart failure) ([place])   . Coronary artery disease involving native coronary artery   . Left ventricular dysfunction   . Dyspnea on exertion   . Chest pain 05/01/2013  . Hyperlipidemia 12/26/2012  . Tobacco abuse 12/26/2012  . Block, bundle branch, left 12/26/2012  . Type 2 diabetes mellitus with stage 3 chronic kidney disease, with long-term current use of insulin ([place]) 12/26/2012  . Essential hypertension 10/03/2008  . SLEEP APNEA 10/03/2008  . Chronic obstructive pulmonary disease ([place]) 10/03/2008    Past Medical History:  Diagnosis Date  . Anxiety   . Arthritis   . Chronic pain    a. Prior h/o chronic pain on methadone.  . Chronic systolic CHF (congestive heart failure) ([facility])    a. mixed ischemic/non-ischemic cardiomyopathy. b. s/p CRT-D in 06/2014.  Marland Kitchen CKD (chronic kidney disease), stage III ([place])   . Complication of anesthesia    hard time waking her up from general surgery  . Coronary artery disease    a. remote RCA stenting in 2008 with non-DES. b. Cath 06/2014 following abnormal nuc: Stable, unchanged from prior cath, patent stent and 40% LM.  . Diabetes mellitus ([place])   . GERD (gastroesophageal reflux disease)   . Headache   . Hyperlipidemia   . Hypertension   . LBBB (left bundle branch block)   . Nonischemic cardiomyopathy ([place])   . OSA (obstructive sleep apnea)    AHI-9.77/hr, during REM-50.32/hr  . Tobacco abuse  Current Outpatient Medications  Medication Sig Dispense Refill  . ALPRAZolam (XANAX) 1 MG tablet Take 1 mg by mouth daily as needed.    Marland Kitchen aspirin EC 81 MG tablet Take 81 mg by mouth at bedtime.    . blood glucose meter kit and supplies KIT Dispense based on patient and insurance preference. Use three times a day  as directed. Dx. E11.65, Z79.4 1 each 11  . carvedilol (COREG) 12.5 MG tablet TAKE 1 & 1/2 (ONE & ONE-HALF) TABLETS BY MOUTH TWICE DAILY WITH A MEAL 270 tablet 3  . clopidogrel (PLAVIX) 75 MG tablet TAKE 1 TABLET BY MOUTH ONCE DAILY 90 tablet 3  . diclofenac sodium (VOLTAREN) 1 % GEL Apply 2 g topically 4 (four) times daily. 100 g 2  . furosemide (LASIX) 40 MG tablet TAKE 2 TABLETS BY MOUTH ONCE DAILY IN THE MORNING AND 1 ONCE DAILY IN THE EVENING 270 tablet 1  . Influenza vac split quadrivalent PF (FLUZONE HIGH-DOSE) 0.5 ML injection Fluzone High-Dose 2015-16 (PF) 180 mcg/0.5 mL intramuscular syringe    . insulin aspart (NOVOLOG FLEXPEN) 100 UNIT/ML FlexPen Per insulin sliding scale, max TTD, 20 units. 15 mL 11  . Insulin Detemir (LEVEMIR) 100 UNIT/ML Pen Inject 20 Units into the skin daily at 10 pm. 15 mL 3  . Insulin Pen Needle 32G X 6 MM MISC Use new needle for each injection of insulin, four times a day. Dx E11.9, Z79.4 150 each 11  . levothyroxine (SYNTHROID, LEVOTHROID) 88 MCG tablet TAKE 1 TABLET BY MOUTH AT BEDTIME 90 tablet 0  . lisinopril (PRINIVIL,ZESTRIL) 2.5 MG tablet Take by mouth.    . MELATONIN PO Take 2 tablets by mouth at bedtime.    . mirtazapine (REMERON) 30 MG tablet Take 1 tablet (30 mg total) by mouth at bedtime. 90 tablet 1  . mupirocin ointment (BACTROBAN) 2 % Apply 1 application topically 2 (two) times daily. 30 g 2  . nitroGLYCERIN (NITROSTAT) 0.4 MG SL tablet Place 1 tablet (0.4 mg total) under the tongue every 5 (five) minutes as needed for chest pain. 25 tablet 3  . ONE TOUCH ULTRA TEST test strip   11  . ONETOUCH DELICA LANCETS 73A MISC   11  . oxyCODONE-acetaminophen (PERCOCET/ROXICET) 5-325 MG tablet Take by mouth every 4 (four) hours as needed for severe pain.    . pantoprazole (PROTONIX) 40 MG tablet Take 40 mg by mouth 2 (two) times daily.     . simvastatin (ZOCOR) 20 MG tablet TAKE 1 TABLET BY MOUTH ONCE DAILY WITH SUPPER 90 tablet 3  . triamcinolone  (NASACORT) 55 MCG/ACT AERO nasal inhaler Place 2 sprays into the nose daily. 1 Inhaler 12  . triamcinolone cream (KENALOG) 0.1 % APPLY ONE APPLICATION TOPICALLY TWO TIMES DAILY 30 g 0   No current facility-administered medications for this visit.     Allergies  Allergen Reactions  . Valium [Diazepam] Swelling    face    Social History   Socioeconomic History  . Marital status: Married    Spouse name: Not on file  . Number of children: Not on file  . Years of education: Not on file  . Highest education level: Not on file  Occupational History  . Not on file  Social Needs  . Financial resource strain: Not on file  . Food insecurity:    Worry: Not on file    Inability: Not on file  . Transportation needs:    Medical: Not on file  Non-medical: Not on file  Tobacco Use  . Smoking status: Current Every Day Smoker    Packs/day: 2.00    Years: 58.00    Pack years: 116.00    Types: Cigarettes    Start date: 12/26/1951    Last attempt to quit: 12/05/2013    Years since quitting: 4.5  . Smokeless tobacco: Never Used  . Tobacco comment: uses Vape cigarettes  Substance and Sexual Activity  . Alcohol use: No    Alcohol/week: 0.0 standard drinks  . Drug use: No  . Sexual activity: Not on file  Lifestyle  . Physical activity:    Days per week: Not on file    Minutes per session: Not on file  . Stress: Not on file  Relationships  . Social connections:    Talks on phone: Not on file    Gets together: Not on file    Attends religious service: Not on file    Active member of club or organization: Not on file    Attends meetings of clubs or organizations: Not on file    Relationship status: Not on file  . Intimate partner violence:    Fear of current or ex partner: Not on file    Emotionally abused: Not on file    Physically abused: Not on file    Forced sexual activity: Not on file  Other Topics Concern  . Not on file  Social History Narrative   Patient lives in  [place] with his husband and grandson.   Son completed suicide on September 17th of this year, patient found him.    Review of Systems  Constitutional: Positive for weight loss (ongoing for months). Negative for chills, fever and malaise/fatigue.  HENT: Positive for sore throat. Negative for congestion, ear discharge, ear pain, hearing loss and sinus pain.   Eyes: Negative for blurred vision, discharge and redness.  Respiratory: Negative for cough, hemoptysis, sputum production, shortness of breath and wheezing.   Cardiovascular: Negative for chest pain.  Gastrointestinal: Negative for diarrhea and vomiting.  Musculoskeletal: Negative for myalgias.  Skin: Negative for rash.    Objective   Vitals as reported by the patient: There were no vitals filed for this visit. Telehealth visit, pt reports she is afebrile.  Diagnoses and all orders for this visit:  Acute pharyngitis, unspecified etiology  Orders Amoxicillin 563m PO bid for 10 days for acute pharyngitis, suspected bacterial etiology.   At this time, given the COVID-19 pandemic, it was agreed between the patient and the provider that empiric treatment could be provided to ensure maximum social distancing and limit potential exposure for the patient. Viral and allergic etiologies are also considered for this patient.  We also discussed symptomatic treatment that included:  Salt water gargle  Extra fluids - clear, room temperature fluids are best, including water, sprite, gatorade. Do not exceed cardiologist recommended daily fluid intake limit.  Extra protein - given patient's reported weight loss in preceding months, it was emphasized that the patient intake sufficient calories and protein through the duration of this illness and beyond.  Call the clinic if symptoms worsen, change, or don't improve in the next 3-4 days.  I discussed the assessment and treatment plan with the patient. The patient was provided an  opportunity to ask questions and all were answered. The patient agreed with the plan and demonstrated an understanding of the instructions.   The patient was advised to call back or seek an in-person evaluation if the symptoms worsen or  if the condition fails to improve as anticipated.  I provided 18 minutes of non-face-to-face time during this encounter.  Maximiano Coss, NP  Primary Care at [facility]

## 2018-06-26 NOTE — Progress Notes (Signed)
Pt states the left side of her throat is sore x 2 days with some drainage and congestion. Pt states it feels a little swollen. Pt states she has not had a fever. She states she has been coughing some but not much.

## 2018-06-27 ENCOUNTER — Telehealth: Payer: Medicare HMO | Admitting: Internal Medicine

## 2018-06-27 NOTE — Telephone Encounter (Signed)
Appointment with Dr. Caryl Comes was cancelled by patient.

## 2018-07-03 ENCOUNTER — Other Ambulatory Visit: Payer: Self-pay

## 2018-07-03 ENCOUNTER — Ambulatory Visit (INDEPENDENT_AMBULATORY_CARE_PROVIDER_SITE_OTHER): Payer: Medicare HMO

## 2018-07-03 DIAGNOSIS — I5022 Chronic systolic (congestive) heart failure: Secondary | ICD-10-CM

## 2018-07-03 DIAGNOSIS — Z95810 Presence of automatic (implantable) cardiac defibrillator: Secondary | ICD-10-CM

## 2018-07-05 ENCOUNTER — Telehealth: Payer: Self-pay | Admitting: Cardiovascular Disease

## 2018-07-05 NOTE — Telephone Encounter (Signed)
Remote transmission shows normal device function. No episodes. Presenting rhythm: As/BiVp @ 64bpm.   Spoke with patient. She is tearful during our conversation as her husband passed away this morning. She reports she has BLE edema and is requesting a fluid check (followed in ICM clinic). She denies abdominal distention, ShOB, chest discomfort, or other cardiac symptoms at this time. She feels a "strangling" sensation in her throat, has had it for years but feels it's aggravated today. Previously saw a SLP and GI MD about this issue. She reports she just wants to make sure she's not holding onto fluid. Reports she thinks her throat issue is exacerbated by stress.  Advised CorVue trend near baseline as of this transmission. Recent weight loss, though she doesn't currently have a scale. She has been sitting much more than usual in recent days--advised to prop legs up when she is sitting. Encouraged continued compliance with medications and low sodium diet. Advised to seek emergency medical attention for worsening cardiac symptoms. Will route message to Margarita Grizzle, RN, as well as Dr. Gwenlyn Found to ensure no additional recommendations. Pt verbalizes understanding and denies additional questions at this time.

## 2018-07-05 NOTE — Telephone Encounter (Signed)
Transmission received 07/05/2018

## 2018-07-05 NOTE — Telephone Encounter (Signed)
New message   Patient sent a transmission today and wants to know if anything is abnormal on the report. Please contact the patient.

## 2018-07-06 NOTE — Telephone Encounter (Signed)
It looks to me like the impedance is going down suggesting that she is not volume overloaded.

## 2018-07-06 NOTE — Progress Notes (Signed)
EPIC Encounter for ICM Monitoring  Patient Name: Omer Monter Ksiazek is a 78 y.o. female Date: 07/06/2018 Primary Care Physican: Rutherford Guys, MD Primary Cardiologist:Berry Electrophysiologist: Vergie Living Pacing: >99% LastWeight:150 lbs(she does not have a scale at home)  Pt called and spoke with Levander Campion, device RN on 07/05/2018 due to she has BLE edema and feels a "strangling" sensation in her throat, has had it for years but feels it's aggravated today. She thinks the feeling in the throat may be r/t stress. Patient's husband passed the morning of her call to device clinic, 4/30.  She was advised to prop feet when sitting.   Dr Gwenlyn Found reviewed impedance and agreed looks normal, no further recommendations at this time.   Thoracic impedance normal.   Prescribed:Furosemide 40 mg 2 tablets (80 mg total) every AMand 1 tablet (40 mg total) every PM. Potassium 20 mEq 1 tablet daily.  Labs: 05/05/2018 Creatinine 1.36, BUN 19, Potassium 3.9, Sodium 146, GFR 38-44 11/27/2017 Creatinine 1.20, BUN 11, Potassium 4.1, Sodium 146, GFR 44-21 07/21/2017 Creatinine 1.26, BUN 16, Potassium 4.2, Sodium 143, GFR 42-48 06/22/2017 Creatinine 1.12, BUN 12, Potassium 3.2, Sodium 144, GFR 48-56 04/21/2017 Creatinine 1.28, BUN 15, Potassium 4.6, Sodium 140, GFR 41-47 A complete set of results can be found in Results Review.  Recommendations: No changes and encouraged to call for fluid symptoms.   Follow-up plan: ICM clinic phone appointment on6/03/2018.    Copy of ICM check sent to Dr. Caryl Comes.    3 month ICM trend: 07/05/2018    1 Year ICM trend:       [name], RN 07/06/2018 8:48 AM

## 2018-07-19 ENCOUNTER — Ambulatory Visit: Payer: Medicare HMO | Admitting: Podiatry

## 2018-07-21 ENCOUNTER — Other Ambulatory Visit: Payer: Self-pay | Admitting: Family Medicine

## 2018-07-21 NOTE — Telephone Encounter (Signed)
Requested Prescriptions  Pending Prescriptions Disp Refills  . BD PEN NEEDLE MICRO U/F 32G X 6 MM MISC [Pharmacy Med Name: BD [id number] MIS] 150 each 11    Sig: USE NEW NEEDLE FOR EACH INJECTION OF INSULIN, FOUR TIMES A DAY     Endocrinology: Diabetes - Testing Supplies Passed - 07/21/2018  5:36 PM      Passed - Valid encounter within last 12 months    Recent Outpatient Visits          2 months ago Type 2 diabetes mellitus with stage 3 chronic kidney disease, with long-term current use of insulin ([place])   Primary Care at [facility], Lilia Argue, MD   7 months ago Type 2 diabetes mellitus with stage 3 chronic kidney disease, with long-term current use of insulin ([place])   [facility] at [facility], Lilia Argue, MD   1 year ago Type 2 diabetes mellitus with stage 3 chronic kidney disease, with long-term current use of insulin [facility])   [facility] at [facility], Lilia Argue, MD   1 year ago Type 2 diabetes mellitus with stage 3 chronic kidney disease, with long-term current use of insulin [facility])   [facility] at [facility], Lilia Argue, MD   1 year ago Ulcer of right foot, unspecified ulcer stage [facility])   [facility] at [facility], Lilia Argue, MD

## 2018-08-01 ENCOUNTER — Telehealth: Payer: Self-pay | Admitting: Internal Medicine

## 2018-08-01 NOTE — Telephone Encounter (Signed)
Have Ms. Beaird see an APP in 3 to 6 weeks and me back in 3 months

## 2018-08-01 NOTE — Telephone Encounter (Signed)
Spoke with pt and has noted for couple of weeks a sensation that feels like a pinch in chest comes and goes and may happen 2-3 times a day and then nothing for a day and then comes  back  Also notes for the last 3 days has a "floaty feeling " upon standing  B/P was 138/81 after said episode. Per pt called a few days ago with feet swelling and this looks better per pt. Will forward to Dr Gwenlyn Found for review .Adonis Housekeeper

## 2018-08-01 NOTE — Telephone Encounter (Signed)
° ° °  Patient has questions about defib Patient reports episodes of chest pain   Pt c/o of Chest Pain: STAT if CP now or developed within 24 hours  1. Are you having CP right now? NO, feels like a "quick pinch" when it doe happen  2. Are you experiencing any other symptoms (ex. SOB, nausea, vomiting, sweating)? Dizziness when standing  3. How long have you been experiencing CP? 1 week  4. Is your CP continuous or coming and going? Coming and going  5. Have you taken Nitroglycerin? no ?

## 2018-08-06 ENCOUNTER — Other Ambulatory Visit: Payer: Self-pay | Admitting: Cardiovascular Disease

## 2018-08-06 ENCOUNTER — Ambulatory Visit (INDEPENDENT_AMBULATORY_CARE_PROVIDER_SITE_OTHER): Payer: Medicare HMO

## 2018-08-06 DIAGNOSIS — R69 Illness, unspecified: Secondary | ICD-10-CM | POA: Diagnosis not present

## 2018-08-06 DIAGNOSIS — I5022 Chronic systolic (congestive) heart failure: Secondary | ICD-10-CM

## 2018-08-06 DIAGNOSIS — I2510 Atherosclerotic heart disease of native coronary artery without angina pectoris: Secondary | ICD-10-CM

## 2018-08-06 DIAGNOSIS — Z95810 Presence of automatic (implantable) cardiac defibrillator: Secondary | ICD-10-CM

## 2018-08-07 ENCOUNTER — Ambulatory Visit (INDEPENDENT_AMBULATORY_CARE_PROVIDER_SITE_OTHER): Payer: Medicare HMO | Admitting: *Deleted

## 2018-08-07 DIAGNOSIS — I428 Other cardiomyopathies: Secondary | ICD-10-CM | POA: Diagnosis not present

## 2018-08-07 NOTE — Progress Notes (Signed)
EPIC Encounter for ICM Monitoring  Patient Name: Yamari Ventola Paullin is a 78 y.o. female Date: 08/07/2018 Primary Care Physican: Rutherford Guys, MD Primary Cardiologist:Berry Electrophysiologist: Vergie Living Pacing: >99% LastWeight:150 lbs(she does not have a scale at home)  Transmission reviewed and results sent via mychart.  Corvue thoracic impedance normal.   Prescribed:Furosemide 40 mg 2 tablets (80 mg total) every AMand 1 tablet (40 mg total) every PM. Potassium 20 mEq 1 tablet daily.  Labs: 05/05/2018 Creatinine 1.36, BUN 19, Potassium 3.9, Sodium 146, GFR 38-44 11/27/2017 Creatinine 1.20, BUN 11, Potassium 4.1, Sodium 146, GFR 44-21 07/21/2017 Creatinine 1.26, BUN 16, Potassium 4.2, Sodium 143, GFR 42-48 06/22/2017 Creatinine 1.12, BUN 12, Potassium 3.2, Sodium 144, GFR 48-56 04/21/2017 Creatinine 1.28, BUN 15, Potassium 4.6, Sodium 140, GFR 41-47 A complete set of results can be found in Results Review.  Recommendations: Encouraged to call for fluid symptoms.  Follow-up plan: ICM clinic phone appointment on7/08/2018.   Copy of ICM check sent to [facility].     3 month ICM trend: 08/06/2018    1 Year ICM trend:       [name], RN 08/07/2018 12:45 PM

## 2018-08-08 LAB — CUP PACEART REMOTE DEVICE CHECK
Battery Remaining Longevity: 40 mo
Battery Remaining Percentage: 43 %
Battery Voltage: 2.9 V
Brady Statistic AP VP Percent: 9.1 %
Brady Statistic AP VS Percent: 1 %
Brady Statistic AS VP Percent: 91 %
Brady Statistic AS VS Percent: 1 %
Brady Statistic RA Percent Paced: 8.9 %
Date Time Interrogation Session: 20200601070343
HighPow Impedance: 71 Ohm
HighPow Impedance: 71 Ohm
Implantable Lead Implant Date: 20160406
Implantable Lead Implant Date: 20160406
Implantable Lead Implant Date: 20160406
Implantable Lead Location: 753858
Implantable Lead Location: 753859
Implantable Lead Location: 753860
Implantable Lead Model: 7122
Implantable Pulse Generator Implant Date: 20160406
Lead Channel Impedance Value: 1275 Ohm
Lead Channel Impedance Value: 380 Ohm
Lead Channel Impedance Value: 630 Ohm
Lead Channel Pacing Threshold Amplitude: 0.875 V
Lead Channel Pacing Threshold Amplitude: 0.875 V
Lead Channel Pacing Threshold Amplitude: 1.125 V
Lead Channel Pacing Threshold Pulse Width: 0.5 ms
Lead Channel Pacing Threshold Pulse Width: 0.5 ms
Lead Channel Pacing Threshold Pulse Width: 0.5 ms
Lead Channel Sensing Intrinsic Amplitude: 12 mV
Lead Channel Sensing Intrinsic Amplitude: 5 mV
Lead Channel Setting Pacing Amplitude: 1.875
Lead Channel Setting Pacing Amplitude: 2 V
Lead Channel Setting Pacing Amplitude: 2.125
Lead Channel Setting Pacing Pulse Width: 0.5 ms
Lead Channel Setting Pacing Pulse Width: 0.5 ms
Lead Channel Setting Sensing Sensitivity: 0.5 mV
Pulse Gen Serial Number: 7199559

## 2018-08-13 ENCOUNTER — Telehealth: Payer: Self-pay | Admitting: Family Medicine

## 2018-08-13 NOTE — Telephone Encounter (Signed)
Copied from [place] [id number]. Topic: General - Other >> Aug 13, 2018  1:35 PM [name] wrote: Patient would like to speak with Dr. Pamella Pert nurse in regards to lab work. Patient would like a callback.

## 2018-08-16 ENCOUNTER — Encounter: Payer: Self-pay | Admitting: Cardiology

## 2018-08-16 NOTE — Progress Notes (Signed)
Remote ICD transmission.   

## 2018-08-20 ENCOUNTER — Encounter: Payer: Self-pay | Admitting: Family Medicine

## 2018-08-20 ENCOUNTER — Ambulatory Visit (INDEPENDENT_AMBULATORY_CARE_PROVIDER_SITE_OTHER): Payer: Medicare HMO | Admitting: Family Medicine

## 2018-08-20 ENCOUNTER — Telehealth: Payer: Self-pay | Admitting: Family Medicine

## 2018-08-20 ENCOUNTER — Other Ambulatory Visit: Payer: Self-pay

## 2018-08-20 VITALS — BP 117/74 | HR 80 | Temp 97.9°F | Ht 66.0 in | Wt 138.0 lb

## 2018-08-20 DIAGNOSIS — I10 Essential (primary) hypertension: Secondary | ICD-10-CM | POA: Diagnosis not present

## 2018-08-20 DIAGNOSIS — R519 Headache, unspecified: Secondary | ICD-10-CM

## 2018-08-20 DIAGNOSIS — R69 Illness, unspecified: Secondary | ICD-10-CM | POA: Diagnosis not present

## 2018-08-20 DIAGNOSIS — N183 Chronic kidney disease, stage 3 unspecified: Secondary | ICD-10-CM

## 2018-08-20 DIAGNOSIS — Z794 Long term (current) use of insulin: Secondary | ICD-10-CM | POA: Diagnosis not present

## 2018-08-20 DIAGNOSIS — E1122 Type 2 diabetes mellitus with diabetic chronic kidney disease: Secondary | ICD-10-CM

## 2018-08-20 DIAGNOSIS — R51 Headache: Secondary | ICD-10-CM | POA: Diagnosis not present

## 2018-08-20 DIAGNOSIS — F4321 Adjustment disorder with depressed mood: Secondary | ICD-10-CM

## 2018-08-20 DIAGNOSIS — E039 Hypothyroidism, unspecified: Secondary | ICD-10-CM

## 2018-08-20 NOTE — Telephone Encounter (Signed)
Copied from [place] [id number]. Topic: Quick Communication - Rx Refill/Question >> Aug 20, 2018  9:37 AM Erick Blinks wrote: Medication: Pt requesting potassium refill   Has the patient contacted their pharmacy? No (Agent: If no, request that the patient contact the pharmacy for the refill.) (Agent: If yes, when and what did the pharmacy advise?)  Preferred Pharmacy (with phone number or street name): [place], [place] [address] [address] Phone: [phone] Fax: [phone]    Agent: Please be advised that RX refills may take up to 3 business days. We ask that you follow-up with your pharmacy.

## 2018-08-20 NOTE — Patient Instructions (Addendum)
  1. Increase mirtazapine to 1 tab at bedtime   If you have lab work done today you will be contacted with your lab results within the next 2 weeks.  If you have not heard from [place] then please contact us. The fastest way to get your results is to register for My Chart.   IF you received an x-ray today, you will receive an invoice from [facility]. Please contact [facility] at [phone] with questions or concerns regarding your invoice.   IF you received labwork today, you will receive an invoice from [facility]. Please contact [facility] at [phone] with questions or concerns regarding your invoice.   Our billing staff will not be able to assist you with questions regarding bills from these companies.  You will be contacted with the lab results as soon as they are available. The fastest way to get your results is to activate your My Chart account. Instructions are located on the last page of this paperwork. If you have not heard from [place] regarding the results in 2 weeks, please contact this office.

## 2018-08-20 NOTE — Progress Notes (Signed)
6/15/20208:23 AM  Latrish M Casseus 01-16-1941, 78 y.o., female [id number]  Chief Complaint  Patient presents with  . Headache    has head headache says for months now and is getting worse. Not taking any meds for the pain. Causing her to feel exhalted and can't sleep  . eys appt    has eye appt coming up soon, not sure of date    HPI:   Patient is a 78 y.o. female with past medical history significant for DM2 on insulin complicated by recent osteomyelitis, hypothyroidism, anxiety and depression, CHF, CKD3, CAD, HLP, HTN,OSAwho presents today for routine followup  Last OV March 2020 - no changes Her husband died this 2022-06-27 She is not sleeping, having issues falling asleep, fearful, specially at night Having headaches, right side, start behind right eye, feels like sinus since before his death, almost daily, they at times resolve, does not take anything for them Denies any nasal congestion, at times feels vision gets shimmery Denies photophobia or phonophobia, denies nausea Denies any focal weakness or jaw pain Makes her dizzy - feels like if she is drunk Years ago saw neurology dx [place] wo migraines Sees eye doctor later this month Not using cpap, quit using it several years ago Having laryngitis - thinks its her nerves Takes xanax and 1/2 remeron - chronic meds, used to be able to fall asleep  Has noticed increased postparandial cbgs, in 200s Checks TID, morning cbg 120s Has had to use novolog more frequently Denies any lows  Has pacemaker in place -sees cards in July Chest pain stable - has discussed with cards No cough, SOB, edema, palpitations Her brother [facility]  Wt Readings from Last 3 Encounters:  08/20/18 138 lb (62.6 kg)  05/15/18 149 lb (67.6 kg)  01/30/18 150 lb 3.2 oz (68.1 kg)    Lab Results  Component Value Date   HGBA1C 5.9 (H) 05/05/2018   HGBA1C 6.1 (A) 11/27/2017   HGBA1C 6.4 07/21/2017   Lab Results   Component Value Date   LDLCALC 68 05/05/2018   CREATININE 1.36 (H) 05/05/2018    Fall Risk  06/26/2018 05/15/2018 11/27/2017 07/21/2017 06/22/2017  Falls in the past year? 0 0 No Yes Yes  Number falls in past yr: - - - 1 2 or more  Injury with Fall? 0 0 - No No  Risk for fall due to : - - - - -  Follow up - - - - -     Depression screen [facility] 2/9 06/26/2018 05/15/2018 11/27/2017  Decreased Interest 0 2 0  Down, Depressed, Hopeless 0 2 0  PHQ - 2 Score 0 4 0  Altered sleeping - 2 -  Tired, decreased energy - 2 -  Change in appetite - 2 -  Feeling bad or failure about yourself  - 2 -  Trouble concentrating - 1 -  Moving slowly or fidgety/restless - 0 -  Suicidal thoughts - 0 -  PHQ-9 Score - 13 -  Some recent data might be hidden    Allergies  Allergen Reactions  . Valium [Diazepam] Swelling    face    Prior to Admission medications   Medication Sig Start Date End Date Taking? Authorizing Provider  ALPRAZolam Duanne Moron) 1 MG tablet Take 1 mg by mouth daily as needed. 04/25/18  Yes [provider]  aspirin EC 81 MG tablet Take 81 mg by mouth at bedtime.   Yes [provider]  BD  PEN NEEDLE MICRO U/F 32G X 6 MM MISC USE NEW NEEDLE FOR EACH INJECTION OF INSULIN, FOUR TIMES A DAY 07/21/18  Yes Rutherford Guys, MD  blood glucose meter kit and supplies KIT Dispense based on patient and insurance preference. Use three times a day as directed. Dx. E11.65, Z79.4 05/15/18  Yes Rutherford Guys, MD  carvedilol (COREG) 12.5 MG tablet TAKE 1 & 1/2 (ONE & ONE-HALF) TABLETS BY MOUTH TWICE DAILY WITH A MEAL 06/12/18  Yes Deboraha Sprang, MD  clopidogrel (PLAVIX) 75 MG tablet TAKE 1 TABLET BY MOUTH ONCE DAILY 02/12/18  Yes Lorretta Harp, MD  diclofenac sodium (VOLTAREN) 1 % GEL Apply 2 g topically 4 (four) times daily. 05/22/17  Yes Rutherford Guys, MD  furosemide (LASIX) 40 MG tablet TAKE 2 TABLETS BY MOUTH ONCE DAILY IN THE MORNING AND TAKE 1 TABLET BY MOUTH ONCE DAILY IN THE EVENING  08/06/18  Yes Lorretta Harp, MD  Influenza vac split quadrivalent PF (FLUZONE HIGH-DOSE) 0.5 ML injection Fluzone High-Dose 2015-16 (PF) 180 mcg/0.5 mL intramuscular syringe   Yes [provider]  insulin aspart (NOVOLOG FLEXPEN) 100 UNIT/ML FlexPen Per insulin sliding scale, max TTD, 20 units. 06/26/17  Yes Rutherford Guys, MD  Insulin Detemir (LEVEMIR) 100 UNIT/ML Pen Inject 20 Units into the skin daily at 10 pm. 10/25/17  Yes Rutherford Guys, MD  levothyroxine (SYNTHROID, LEVOTHROID) 88 MCG tablet TAKE 1 TABLET BY MOUTH AT BEDTIME 03/23/18  Yes Rutherford Guys, MD  lisinopril (PRINIVIL,ZESTRIL) 2.5 MG tablet Take by mouth. 03/25/16  Yes [provider]  MELATONIN PO Take 2 tablets by mouth at bedtime.   Yes [provider]  mirtazapine (REMERON) 30 MG tablet Take 1 tablet (30 mg total) by mouth at bedtime. 11/27/17  Yes Rutherford Guys, MD  mupirocin ointment (BACTROBAN) 2 % Apply 1 application topically 2 (two) times daily. 07/21/17  Yes [name], DPM  nitroGLYCERIN (NITROSTAT) 0.4 MG SL tablet Place 1 tablet (0.4 mg total) under the tongue every 5 (five) minutes as needed for chest pain. 05/01/13  Yes Brett Canales, PA-C  ONE TOUCH ULTRA TEST test strip  06/07/17  Yes [provider]  Jonetta Speak LANCETS 63Z Index  06/07/17  Yes [provider]  oxyCODONE-acetaminophen (PERCOCET/ROXICET) 5-325 MG tablet Take by mouth every 4 (four) hours as needed for severe pain.   Yes [provider]  pantoprazole (PROTONIX) 40 MG tablet Take 40 mg by mouth 2 (two) times daily.  04/30/13  Yes [provider]  simvastatin (ZOCOR) 20 MG tablet TAKE 1 TABLET BY MOUTH ONCE DAILY WITH SUPPER 06/25/18  Yes Rutherford Guys, MD  triamcinolone (NASACORT) 55 MCG/ACT AERO nasal inhaler Place 2 sprays into the nose daily. 05/15/18  Yes Rutherford Guys, MD  triamcinolone cream (KENALOG) 0.1 % APPLY ONE APPLICATION TOPICALLY TWO TIMES DAILY 06/18/18  Yes  Rutherford Guys, MD    Past Medical History:  Diagnosis Date  . Anxiety   . Arthritis   . Chronic pain    a. Prior h/o chronic pain on methadone.  . Chronic systolic CHF (congestive heart failure) (HCC)    a. mixed ischemic/non-ischemic cardiomyopathy. b. s/p CRT-D in 06/2014.  Marland Kitchen CKD (chronic kidney disease), stage III ([place])   . Complication of anesthesia    hard time waking her up from general surgery  . Coronary artery disease    a. remote RCA stenting in 2008 with non-DES. b. Cath  06/2014 following abnormal nuc: Stable, unchanged from prior cath, patent stent and 40% LM.  . Diabetes mellitus ([place])   . GERD (gastroesophageal reflux disease)   . Headache   . Hyperlipidemia   . Hypertension   . LBBB (left bundle branch block)   . Nonischemic cardiomyopathy ([place])   . OSA (obstructive sleep apnea)    AHI-9.77/hr, during REM-50.32/hr  . Tobacco abuse     Past Surgical History:  Procedure Laterality Date  . BACK SURGERY  2013  . BI-VENTRICULAR IMPLANTABLE CARDIOVERTER DEFIBRILLATOR N/A 06/11/2014   STJ CRTD implanted by Dr Caryl Comes  . CARDIAC CATHETERIZATION  12/14/2006   RCA stented with a 3.0 [employer] Liberte stent resulting in a reduction of 75% to 0% residual  . CHOLECYSTECTOMY     20 years ago  . COLONOSCOPY WITH PROPOFOL N/A 12/15/2014   Procedure: COLONOSCOPY WITH PROPOFOL;  Surgeon: Clarene Essex, MD;  Location: [facility] ENDOSCOPY;  Service: Endoscopy;  Laterality: N/A;  . EYE SURGERY     bilateral cataract surgery   . LEFT AND RIGHT HEART CATHETERIZATION WITH CORONARY ANGIOGRAM N/A 05/29/2014   Procedure: LEFT AND RIGHT HEART CATHETERIZATION WITH CORONARY ANGIOGRAM;  Surgeon: Lorretta Harp, MD;  Location: [facility] CATH LAB;  Service: Cardiovascular;  Laterality: N/A;  . LEFT HEART CATHETERIZATION WITH CORONARY ANGIOGRAM N/A 12/27/2012   Procedure: LEFT HEART CATHETERIZATION WITH CORONARY ANGIOGRAM;  Surgeon: Lorretta Harp, MD;  Location: [facility] CATH LAB;  Service: Cardiovascular;   Laterality: N/A;    Social History   Tobacco Use  . Smoking status: Current Every Day Smoker    Packs/day: 2.00    Years: 58.00    Pack years: 116.00    Types: Cigarettes    Start date: 12/26/1952    Last attempt to quit: 12/05/2013    Years since quitting: 4.7  . Smokeless tobacco: Never Used  . Tobacco comment: uses Vape cigarettes  Substance Use Topics  . Alcohol use: No    Alcohol/week: 0.0 standard drinks    Family History  Problem Relation Age of Onset  . Stroke Mother   . Hypertension Mother   . Coronary artery disease Father   . Stroke Brother   . Heart disease Brother   . Other Brother        H1N1 VIRUS  . Healthy Sister   . Healthy Sister     ROS Per hpi  OBJECTIVE:  Today's Vitals   08/20/18 0816  BP: 117/74  Pulse: 80  Temp: 97.9 F (36.6 C)  TempSrc: Oral  SpO2: 100%  Weight: 138 lb (62.6 kg)  Height: _0  (1.676 m)   Body mass index is 22.27 kg/m.   Physical Exam Vitals signs and nursing note reviewed.  Constitutional:      Appearance: She is well-developed.  HENT:     Head: Normocephalic and atraumatic.     Jaw: No tenderness or pain on movement.     Right Ear: Hearing, tympanic membrane, ear canal and external ear normal.     Left Ear: Hearing, tympanic membrane, ear canal and external ear normal.     Nose:     Right Sinus: No maxillary sinus tenderness or frontal sinus tenderness.     Left Sinus: No maxillary sinus tenderness or frontal sinus tenderness.     Comments: TA are nontender and soft, pulses are palpable Eyes:     Conjunctiva/sclera: Conjunctivae normal.     Pupils: Pupils are equal, round, and reactive to light.  Neck:  Musculoskeletal: Neck supple.     Thyroid: No thyroid mass or thyromegaly.  Cardiovascular:     Rate and Rhythm: Normal rate and regular rhythm.     Heart sounds: Normal heart sounds. No murmur. No friction rub. No gallop.   Pulmonary:     Effort: Pulmonary effort is normal.     Breath  sounds: Normal breath sounds. No wheezing or rales.  Musculoskeletal:     Right lower leg: No edema.     Left lower leg: No edema.  Lymphadenopathy:     Cervical: No cervical adenopathy.  Skin:    General: Skin is warm and dry.  Neurological:     General: No focal deficit present.     Mental Status: She is alert and oriented to person, place, and time.     Cranial Nerves: Cranial nerves are intact.  Psychiatric:        Mood and Affect: Affect is tearful.     ASSESSMENT and PLAN  1. Grief Discussed increasing remeron to 1 tab daily - Ambulatory referral to Psychology  2. Acute intractable headache, unspecified headache type Migraines? Stress? Labs and ct pending. Not on cpap. Consider neuro referral. - Sedimentation Rate - CT Head Wo Contrast; Future  3. Type 2 diabetes mellitus with stage 3 chronic kidney disease, with long-term current use of insulin (HCC) Recently trending up cbgs, cont novolog use per sliding scale - Comprehensive metabolic panel  4. Hypothyroidism, unspecified type Checking labs today, medications will be adjusted as needed.  - TSH  5. Essential hypertension Controlled. Continue current regime.   Return in about 4 weeks (around 09/17/2018).    Rutherford Guys, MD Primary Care at [facility] [place], [place] [address] Ph.  [phone] Fax [phone]

## 2018-08-21 LAB — COMPREHENSIVE METABOLIC PANEL
ALT: 11 IU/L (ref 0–32)
AST: 16 IU/L (ref 0–40)
Albumin/Globulin Ratio: 1.7 (ref 1.2–2.2)
Albumin: 4.1 g/dL (ref 3.7–4.7)
Alkaline Phosphatase: 79 IU/L (ref 39–117)
BUN/Creatinine Ratio: 11 — ABNORMAL LOW (ref 12–28)
BUN: 13 mg/dL (ref 8–27)
Bilirubin Total: 0.5 mg/dL (ref 0.0–1.2)
CO2: 27 mmol/L (ref 20–29)
Calcium: 10.2 mg/dL (ref 8.7–10.3)
Chloride: 103 mmol/L (ref 96–106)
Creatinine, Ser: 1.19 mg/dL — ABNORMAL HIGH (ref 0.57–1.00)
GFR calc Af Amer: 51 mL/min/{1.73_m2} — ABNORMAL LOW (ref >59)
GFR calc non Af Amer: 44 mL/min/{1.73_m2} — ABNORMAL LOW (ref >59)
Globulin, Total: 2.4 g/dL (ref 1.5–4.5)
Glucose: 60 mg/dL — ABNORMAL LOW (ref 65–99)
Potassium: 3.8 mmol/L (ref 3.5–5.2)
Sodium: 144 mmol/L (ref 134–144)
Total Protein: 6.5 g/dL (ref 6.0–8.5)

## 2018-08-21 LAB — SEDIMENTATION RATE: Sed Rate: 3 mm/hr (ref 0–40)

## 2018-08-21 LAB — TSH: TSH: 1.64 u[IU]/mL (ref 0.450–4.500)

## 2018-08-23 ENCOUNTER — Encounter: Payer: Self-pay | Admitting: Family Medicine

## 2018-08-23 ENCOUNTER — Telehealth: Payer: Self-pay | Admitting: Family Medicine

## 2018-08-23 ENCOUNTER — Other Ambulatory Visit: Payer: Self-pay

## 2018-08-23 MED ORDER — POTASSIUM CHLORIDE CRYS ER 20 MEQ PO TBCR: 20.0000 meq | EXTENDED_RELEASE_TABLET | Freq: Two times a day (BID) | ORAL | 0 refills | Status: DC

## 2018-08-23 NOTE — Telephone Encounter (Signed)
Rx sent to pharmacy   

## 2018-08-23 NOTE — Telephone Encounter (Signed)
Copied from [place] [id number]. Topic: General - Other >> Aug 22, 2018  4:59 PM Nils Flack, Marland Kitchen wrote: Reason for CRM: please call when labs are in

## 2018-08-23 NOTE — Telephone Encounter (Signed)
Posted labs with message on mychart

## 2018-08-24 NOTE — Telephone Encounter (Signed)
lmtcb to set up appt with APP; recall for f/u in 3 mos in epic

## 2018-08-30 ENCOUNTER — Ambulatory Visit: Payer: Self-pay | Admitting: *Deleted

## 2018-08-30 NOTE — Telephone Encounter (Signed)
Please schedule appointment ( Telemed)

## 2018-08-30 NOTE — Telephone Encounter (Signed)
SCHEDULED

## 2018-08-30 NOTE — Telephone Encounter (Signed)
Pt called with complaints of chills, and fever of 101.6 at 0650; she states that she wears mask, gloves; she denies travel; recommendations made per nurse triage protocol; she verbalizes understanding; the pt can be contacted at [phone]; will route to office for notification of this encounter.  Reason for Disposition . [1] Fever > 100.0 F (37.8 C) AND [2] diabetes mellitus or weak immune system (e.g., HIV positive, cancer chemo, splenectomy, organ transplant, chronic steroids)  Answer Assessment - Initial Assessment Questions 1. TEMPERATURE: "What is the most recent temperature?"  "How was it measured?"      101.6 at 0650; 100.4 at 0710 on 08/30/2018 2. ONSET: "When did the fever start?"     08/30/2018 at 0650 3. SYMPTOMS: "Do you have any other symptoms besides the fever?"  (e.g., colds, headache, sore throat, earache, cough, rash, diarrhea, vomiting, abdominal pain)  dry heaves 4. CAUSE: If there are no symptoms, ask: "What do you think is causing the fever?"      Not ure 5. CONTACTS: "Does anyone else in the family have an infection?"    no 6. TREATMENT: "What have you done so far to treat this fever?" (e.g., medications)    nothing 7. IMMUNOCOMPROMISE: "Do you have of the following: diabetes, HIV positive, splenectomy, cancer chemotherapy, chronic steroid treatment, transplant patient, etc."     Diabetic, smoker 8. PREGNANCY: "Is there any chance you are pregnant?" "When was your last menstrual period?"   no 9. TRAVEL: "Have you traveled out of the country in the last month?" (e.g., travel history, exposures)    no  Protocols used: FEVER-A-AH

## 2018-08-31 ENCOUNTER — Telehealth: Payer: Medicare HMO | Admitting: Family Medicine

## 2018-08-31 MED ORDER — INSULIN DETEMIR 100 UNIT/ML FLEXPEN: 16.0000 [IU] | PEN_INJECTOR | Freq: Every day | SUBCUTANEOUS | 3 refills | Status: DC

## 2018-08-31 NOTE — Progress Notes (Signed)
Patient is a 78 y.o. female with past medical history significant for DM2. Woke up yesterday morning, uncontrolled trembling, was able to eat and chk cbg at 89. Has not had any episodes since. Feels a lot better today. Blood sugar is 89 right now.

## 2018-08-31 NOTE — Progress Notes (Signed)
Unable to reach patient via phone Left VM instructing to decrease levemir from 20 units to 16 units followup in 1 week  Lab Results  Component Value Date   HGBA1C 5.9 (H) 05/05/2018   Lab Results  Component Value Date   CREATININE 1.19 (H) 08/20/2018   BUN 13 08/20/2018   NA 144 08/20/2018   K 3.8 08/20/2018   CL 103 08/20/2018   CO2 27 08/20/2018   Last glucose 60

## 2018-09-01 DIAGNOSIS — R69 Illness, unspecified: Secondary | ICD-10-CM | POA: Diagnosis not present

## 2018-09-03 ENCOUNTER — Other Ambulatory Visit: Payer: Self-pay | Admitting: Family Medicine

## 2018-09-03 NOTE — Telephone Encounter (Signed)
Pt following up on Rx Insulin Detemir (LEVEMIR) 100 UNIT/ML Pen  This rx says it was printed, but the pharmacy never received.  Please send asap to   [facility], [place] [phone] (Phone) [phone] (Fax)   Pt transferred to office for one week follow up per Dr Pamella Pert.

## 2018-09-04 ENCOUNTER — Other Ambulatory Visit: Payer: Self-pay | Admitting: Family Medicine

## 2018-09-04 MED ORDER — INSULIN DETEMIR 100 UNIT/ML FLEXPEN: 16.0000 [IU] | PEN_INJECTOR | Freq: Every day | SUBCUTANEOUS | 3 refills | Status: DC

## 2018-09-04 NOTE — Telephone Encounter (Signed)
Patient wants to know when Dr. Pamella Pert will call in her insulin? She does not have any left.  Says it's supposed to be 16 units per day.  Transferring to schedule to schedul 1 wk f/u.

## 2018-09-04 NOTE — Telephone Encounter (Signed)
Medication was sent today.

## 2018-09-06 ENCOUNTER — Telehealth (INDEPENDENT_AMBULATORY_CARE_PROVIDER_SITE_OTHER): Payer: Medicare HMO | Admitting: Family Medicine

## 2018-09-06 DIAGNOSIS — F4323 Adjustment disorder with mixed anxiety and depressed mood: Secondary | ICD-10-CM | POA: Diagnosis not present

## 2018-09-06 DIAGNOSIS — E11649 Type 2 diabetes mellitus with hypoglycemia without coma: Secondary | ICD-10-CM

## 2018-09-06 DIAGNOSIS — R058 Other specified cough: Secondary | ICD-10-CM

## 2018-09-06 DIAGNOSIS — R05 Cough: Secondary | ICD-10-CM | POA: Diagnosis not present

## 2018-09-06 DIAGNOSIS — R69 Illness, unspecified: Secondary | ICD-10-CM | POA: Diagnosis not present

## 2018-09-06 MED ORDER — SERTRALINE HCL 25 MG PO TABS: 25.0000 mg | ORAL_TABLET | Freq: Every day | ORAL | 2 refills | Status: DC

## 2018-09-06 MED ORDER — DOXYCYCLINE HYCLATE 100 MG PO TABS: 100.0000 mg | ORAL_TABLET | Freq: Two times a day (BID) | ORAL | 0 refills | Status: DC

## 2018-09-06 MED ORDER — MIRTAZAPINE 30 MG PO TABS: 15.0000 mg | ORAL_TABLET | Freq: Every day | ORAL | 1 refills | Status: DC

## 2018-09-06 NOTE — Patient Instructions (Signed)
Hypoglycemia Hypoglycemia is when the sugar (glucose) level in your blood is too low. Signs of low blood sugar may include:  Feeling: ? Hungry. ? Worried or nervous (anxious). ? Sweaty and clammy. ? Confused. ? Dizzy. ? Sleepy. ? Sick to your stomach (nauseous).  Having: ? A fast heartbeat. ? A headache. ? A change in your vision. ? Tingling or no feeling (numbness) around your mouth, lips, or tongue. ? Jerky movements that you cannot control (seizure).  Having trouble with: ? Moving (coordination). ? Sleeping. ? Passing out (fainting). ? Getting upset easily (irritability). Low blood sugar can happen to people who have diabetes and people who do not have diabetes. Low blood sugar can happen quickly, and it can be an emergency. Treating low blood sugar Low blood sugar is often treated by eating or drinking something sugary right away, such as:  Fruit juice, 4-6 oz (120-150 mL).  Regular soda (not diet soda), 4-6 oz (120-150 mL).  Low-fat milk, 4 oz (120 mL).  Several pieces of hard candy.  Sugar or honey, 1 Tbsp (15 mL). Treating low blood sugar if you have diabetes If you can think clearly and swallow safely, follow the 15:15 rule:  Take 15 grams of a fast-acting carb (carbohydrate). Talk with your doctor about how much you should take.  Always keep a source of fast-acting carb with you, such as: ? Sugar tablets (glucose pills). Take 3-4 pills. ? 6-8 pieces of hard candy. ? 4-6 oz (120-150 mL) of fruit juice. ? 4-6 oz (120-150 mL) of regular (not diet) soda. ? 1 Tbsp (15 mL) honey or sugar.  Check your blood sugar 15 minutes after you take the carb.  If your blood sugar is still at or below 70 mg/dL (3.9 mmol/L), take 15 grams of a carb again.  If your blood sugar does not go above 70 mg/dL (3.9 mmol/L) after 3 tries, get help right away.  After your blood sugar goes back to normal, eat a meal or a snack within 1 hour.  Treating very low blood sugar If your  blood sugar is at or below 54 mg/dL (3 mmol/L), you have very low blood sugar (severe hypoglycemia). This may also cause:  Passing out.  Jerky movements you cannot control (seizure).  Losing consciousness (coma). This is an emergency. Do not wait to see if the symptoms will go away. Get medical help right away. Call your local emergency services (911 in the [place] Do not drive yourself to the hospital. If you have very low blood sugar and you cannot eat or drink, you may need a glucagon shot (injection). A family member or friend should learn how to check your blood sugar and how to give you a glucagon shot. Ask your doctor if you need to have a glucagon shot kit at home. Follow these instructions at home: General instructions  Take over-the-counter and prescription medicines only as told by your doctor.  Stay aware of your blood sugar as told by your doctor.  Limit alcohol intake to no more than 1 drink a day for nonpregnant women and 2 drinks a day for men. One drink equals 12 oz of beer (355 mL), 5 oz of wine (148 mL), or 1 oz of hard liquor (44 mL).  Keep all follow-up visits as told by your doctor. This is important. If you have diabetes:   Follow your diabetes care plan as told by your doctor. Make sure you: ? Know the signs of low blood sugar. ?  Take your medicines as told. ? Follow your exercise and meal plan. ? Eat on time. Do not skip meals. ? Check your blood sugar as often as told by your doctor. Always check it before and after exercise. ? Follow your sick day plan when you cannot eat or drink normally. Make this plan ahead of time with your doctor.  Share your diabetes care plan with: ? Your work or school. ? People you live with.  Check your pee (urine) for ketones: ? When you are sick. ? As told by your doctor.  Carry a card or wear jewelry that says you have diabetes. Contact a doctor if:  You have trouble keeping your blood sugar in your target range.   You have low blood sugar often. Get help right away if:  You still have symptoms after you eat or drink something sugary.  Your blood sugar is at or below 54 mg/dL (3 mmol/L).  You have jerky movements that you cannot control.  You pass out. These symptoms may be an emergency. Do not wait to see if the symptoms will go away. Get medical help right away. Call your local emergency services (911 in the [place] Do not drive yourself to the hospital. Summary  Hypoglycemia happens when the level of sugar (glucose) in your blood is too low.  Low blood sugar can happen to people who have diabetes and people who do not have diabetes. Low blood sugar can happen quickly, and it can be an emergency.  Make sure you know the signs of low blood sugar and know how to treat it.  Always keep a source of sugar (fast-acting carb) with you to treat low blood sugar. This information is not intended to replace advice given to you by your health care provider. Make sure you discuss any questions you have with your health care provider. Document Released: 05/18/2009 Document Revised: 06/14/2018 Document Reviewed: 03/27/2015 Elsevier Patient Education  2020 [employer]  

## 2018-09-06 NOTE — Progress Notes (Signed)
Virtual Visit Note  I connected with patient on 09/06/18 at 1258pm by phone and verified that I am speaking with the correct person using two identifiers. [name] is currently located at home and patient is currently with them during visit. The provider, Rutherford Guys, MD is located in their office at time of visit.  I discussed the limitations, risks, security and privacy concerns of performing an evaluation and management service by telephone and the availability of in person appointments. I also discussed with the patient that there may be a patient responsible charge related to this service. The patient expressed understanding and agreed to proceed.   CC: recurring lows  HPI ? Patient has been having recurring lows throughout the day - makes her sweaty, weak, fatigued, sx 80s,  She has not decreased her levemir as discussed, still taking 20 units a day Most low cbgs are in the evening, after supper, has needed to start snacking She has not used novolog in almost a year She takes no other medications for DM This morning cbg 123  She is currently grieving death of her husband She does not want to take anything more for her depression She reports her appetite has been decreased - has lost about a pound this past week Nights are very hard on her but doing well on xanax and 64m remeron - sleeps about 6-7 hours  She continues to have headaches and "floaty" feeling - ct scan scheduled for next week Having cough for past several weeks, chest congestion, productive for past week Denies any SOB, fever or chills, no changes to smell or taste, fatigue, CP, edema Denies any sinus pressure, ear pain Had a raspy throat x 1 day but then resolved She smokes, does not have recurring bronchitis, had pna about 4 years ago  Reviewed labs  Allergies  Allergen Reactions  . Valium [Diazepam] Swelling    face    Prior to Admission medications   Medication Sig Start Date End Date Taking?  Authorizing Provider  ALPRAZolam (Duanne Moron 1 MG tablet Take 1 mg by mouth daily as needed. 04/25/18   [provider]  aspirin EC 81 MG tablet Take 81 mg by mouth at bedtime.    [provider]  BD PEN NEEDLE MICRO U/F 32G X 6 MM MISC USE NEW NEEDLE FOR EACH INJECTION OF INSULIN, FOUR TIMES A DAY 07/21/18   SRutherford Guys MD  blood glucose meter kit and supplies KIT Dispense based on patient and insurance preference. Use three times a day as directed. Dx. E11.65, Z79.4 05/15/18   SRutherford Guys MD  carvedilol (COREG) 12.5 MG tablet TAKE 1 & 1/2 (ONE & ONE-HALF) TABLETS BY MOUTH TWICE DAILY WITH A MEAL 06/12/18   KDeboraha Sprang MD  clopidogrel (PLAVIX) 75 MG tablet TAKE 1 TABLET BY MOUTH ONCE DAILY 02/12/18   BLorretta Harp MD  diclofenac sodium (VOLTAREN) 1 % GEL Apply 2 g topically 4 (four) times daily. 05/22/17   SRutherford Guys MD  furosemide (LASIX) 40 MG tablet TAKE 2 TABLETS BY MOUTH ONCE DAILY IN THE MORNING AND TAKE 1 TABLET BY MOUTH ONCE DAILY IN THE EVENING 08/06/18   BLorretta Harp MD  Influenza vac split quadrivalent PF (FLUZONE HIGH-DOSE) 0.5 ML injection Fluzone High-Dose 2015-16 (PF) 180 mcg/0.5 mL intramuscular syringe    [provider]  insulin aspart (NOVOLOG FLEXPEN) 100 UNIT/ML FlexPen Per insulin sliding scale, max TTD, 20 units. 06/26/17   SGrant Fontana  M, MD  Insulin Detemir (LEVEMIR) 100 UNIT/ML Pen Inject 16 Units into the skin daily at 10 pm. 09/04/18   Rutherford Guys, MD  levothyroxine (SYNTHROID, LEVOTHROID) 88 MCG tablet TAKE 1 TABLET BY MOUTH AT BEDTIME 03/23/18   Rutherford Guys, MD  lisinopril (PRINIVIL,ZESTRIL) 2.5 MG tablet Take by mouth. 03/25/16   [provider]  MELATONIN PO Take 2 tablets by mouth at bedtime.    [provider]  mirtazapine (REMERON) 30 MG tablet Take 1 tablet (30 mg total) by mouth at bedtime. 11/27/17   Rutherford Guys, MD  mupirocin ointment (BACTROBAN) 2 % Apply 1 application topically 2  (two) times daily. 07/21/17   [name], DPM  nitroGLYCERIN (NITROSTAT) 0.4 MG SL tablet Place 1 tablet (0.4 mg total) under the tongue every 5 (five) minutes as needed for chest pain. 05/01/13   Brett Canales, PA-C  ONE TOUCH ULTRA TEST test strip  06/07/17   [provider]  Jonetta Speak LANCETS 24M [place]  06/07/17   [provider]  oxyCODONE-acetaminophen (PERCOCET/ROXICET) 5-325 MG tablet Take by mouth every 4 (four) hours as needed for severe pain.    [provider]  pantoprazole (PROTONIX) 40 MG tablet Take 40 mg by mouth 2 (two) times daily.  04/30/13   [provider]  potassium chloride SA (K-DUR) 20 MEQ tablet Take 1 tablet (20 mEq total) by mouth 2 (two) times daily. 08/23/18   Rutherford Guys, MD  simvastatin (ZOCOR) 20 MG tablet TAKE 1 TABLET BY MOUTH ONCE DAILY WITH SUPPER 06/25/18   Rutherford Guys, MD  triamcinolone (NASACORT) 55 MCG/ACT AERO nasal inhaler Place 2 sprays into the nose daily. 05/15/18   Rutherford Guys, MD  triamcinolone cream (KENALOG) 0.1 % APPLY ONE APPLICATION TOPICALLY TWO TIMES DAILY 06/18/18   Rutherford Guys, MD    Past Medical History:  Diagnosis Date  . Anxiety   . Arthritis   . Chronic pain    a. Prior h/o chronic pain on methadone.  . Chronic systolic CHF (congestive heart failure) (HCC)    a. mixed ischemic/non-ischemic cardiomyopathy. b. s/p CRT-D in 06/2014.  Marland Kitchen CKD (chronic kidney disease), stage III ([place])   . Complication of anesthesia    hard time waking her up from general surgery  . Coronary artery disease    a. remote RCA stenting in 2008 with non-DES. b. Cath 06/2014 following abnormal nuc: Stable, unchanged from prior cath, patent stent and 40% LM.  . Diabetes mellitus ([place])   . GERD (gastroesophageal reflux disease)   . Headache   . Hyperlipidemia   . Hypertension   . LBBB (left bundle branch block)   . Nonischemic cardiomyopathy ([place])   . OSA (obstructive sleep apnea)    AHI-9.77/hr,  during REM-50.32/hr  . Tobacco abuse     Past Surgical History:  Procedure Laterality Date  . BACK SURGERY  2013  . BI-VENTRICULAR IMPLANTABLE CARDIOVERTER DEFIBRILLATOR N/A 06/11/2014   STJ CRTD implanted by Dr Caryl Comes  . CARDIAC CATHETERIZATION  12/14/2006   RCA stented with a 3.0 [employer] Liberte stent resulting in a reduction of 75% to 0% residual  . CHOLECYSTECTOMY     20 years ago  . COLONOSCOPY WITH PROPOFOL N/A 12/15/2014   Procedure: COLONOSCOPY WITH PROPOFOL;  Surgeon: Clarene Essex, MD;  Location: [facility];  Service: Endoscopy;  Laterality: N/A;  . EYE SURGERY     bilateral cataract surgery   . LEFT AND RIGHT HEART  CATHETERIZATION WITH CORONARY ANGIOGRAM N/A 05/29/2014   Procedure: LEFT AND RIGHT HEART CATHETERIZATION WITH CORONARY ANGIOGRAM;  Surgeon: Lorretta Harp, MD;  Location: [facility] CATH LAB;  Service: Cardiovascular;  Laterality: N/A;  . LEFT HEART CATHETERIZATION WITH CORONARY ANGIOGRAM N/A 12/27/2012   Procedure: LEFT HEART CATHETERIZATION WITH CORONARY ANGIOGRAM;  Surgeon: Lorretta Harp, MD;  Location: [facility] CATH LAB;  Service: Cardiovascular;  Laterality: N/A;    Social History   Tobacco Use  . Smoking status: Current Every Day Smoker    Packs/day: 2.00    Years: 58.00    Pack years: 116.00    Types: Cigarettes    Start date: 12/26/1951    Last attempt to quit: 12/05/2013    Years since quitting: 4.7  . Smokeless tobacco: Never Used  . Tobacco comment: uses Vape cigarettes  Substance Use Topics  . Alcohol use: No    Alcohol/week: 0.0 standard drinks    Family History  Problem Relation Age of Onset  . Stroke Mother   . Hypertension Mother   . Coronary artery disease Father   . Stroke Brother   . Heart disease Brother   . Other Brother        H1N1 VIRUS  . Healthy Sister   . Healthy Sister     ROS Per hpi  Objective  Vitals as reported by the patient: none   ASSESSMENT and PLAN  1. Hypoglycemia associated with type 2 diabetes  mellitus (HCC) Decreased lantus to 16 units. Discussed mgt of hypoglycemia.   2. Productive cough Smoker, no other covid 19 sx. Limited due to virtual visit. Treating as COPD. RTC precautions given.   3. Adjustment reaction with anxiety and depression Not controlled. Adding sertraline, reviewed r/se/b  Other orders - doxycycline (VIBRA-TABS) 100 MG tablet; Take 1 tablet (100 mg total) by mouth 2 (two) times daily. - sertraline (ZOLOFT) 25 MG tablet; Take 1 tablet (25 mg total) by mouth daily. - mirtazapine (REMERON) 30 MG tablet; Take 0.5 tablets (15 mg total) by mouth at bedtime.  FOLLOW-UP: as scheduled    The above assessment and management plan was discussed with the patient. The patient verbalized understanding of and has agreed to the management plan. Patient is aware to call the clinic if symptoms persist or worsen. Patient is aware when to return to the clinic for a follow-up visit. Patient educated on when it is appropriate to go to the emergency department.    I provided 25 minutes of non-face-to-face time during this encounter.  Rutherford Guys, MD Primary Care at [place], [place] [address] Ph.  [phone] Fax [phone]

## 2018-09-10 ENCOUNTER — Ambulatory Visit (INDEPENDENT_AMBULATORY_CARE_PROVIDER_SITE_OTHER): Payer: Medicare HMO

## 2018-09-10 DIAGNOSIS — I5022 Chronic systolic (congestive) heart failure: Secondary | ICD-10-CM | POA: Diagnosis not present

## 2018-09-10 DIAGNOSIS — Z95810 Presence of automatic (implantable) cardiac defibrillator: Secondary | ICD-10-CM | POA: Diagnosis not present

## 2018-09-11 ENCOUNTER — Other Ambulatory Visit: Payer: Medicare HMO

## 2018-09-11 NOTE — Progress Notes (Signed)
EPIC Encounter for ICM Monitoring  Patient Name: [name] is a 78 y.o. female Date: 09/11/2018 Primary Care Physican: Rutherford Guys, MD Primary Cardiologist:Berry Electrophysiologist: Vergie Living Pacing: >99% 09/11/2018 Weight:136 lbs  Spoke with patient.  She said she feels fine and denies any fluid symptoms.    [place] thoracic impedance normal.   Prescribed:Furosemide 40 mg 2 tablets (80 mg total) every AMand 1 tablet (40 mg total) every PM. Potassium 20 mEq 1 tablet daily.  Labs: 05/05/2018 Creatinine 1.36, BUN 19, Potassium 3.9, Sodium 146, GFR 38-44 11/27/2017 Creatinine 1.20, BUN 11, Potassium 4.1, Sodium 146, GFR 44-21 07/21/2017 Creatinine 1.26, BUN 16, Potassium 4.2, Sodium 143, GFR 42-48 06/22/2017 Creatinine 1.12, BUN 12, Potassium 3.2, Sodium 144, GFR 48-56 04/21/2017 Creatinine 1.28, BUN 15, Potassium 4.6, Sodium 140, GFR 41-47 A complete set of results can be found in Results Review.  Recommendations: Encouraged to call for fluid symptoms.  Follow-up plan: ICM clinic phone appointment on8/17/2020.   Copy of ICM check sent to [place].   3 month ICM trend: 09/10/2018    1 Year ICM trend:       Allison Billings, RN 09/11/2018 5:45 PM

## 2018-09-12 ENCOUNTER — Other Ambulatory Visit: Payer: Self-pay | Admitting: Family Medicine

## 2018-09-13 NOTE — Telephone Encounter (Signed)
Called pt to update her of Dr. Kennon Holter recommendations. Pt state she already have an appointment scheduled with Dr. Gwenlyn Found for 8/4.

## 2018-09-14 ENCOUNTER — Telehealth: Payer: Self-pay | Admitting: Internal Medicine

## 2018-09-14 NOTE — Telephone Encounter (Signed)
New Message ° ° ° °Left message to confirm appt and answer covid questions  °

## 2018-09-17 ENCOUNTER — Ambulatory Visit (INDEPENDENT_AMBULATORY_CARE_PROVIDER_SITE_OTHER): Payer: Medicare HMO | Admitting: Internal Medicine

## 2018-09-17 ENCOUNTER — Other Ambulatory Visit: Payer: Self-pay

## 2018-09-17 ENCOUNTER — Encounter: Payer: Self-pay | Admitting: Internal Medicine

## 2018-09-17 VITALS — BP 102/70 | HR 76 | Ht 66.0 in | Wt 135.8 lb

## 2018-09-17 DIAGNOSIS — I5022 Chronic systolic (congestive) heart failure: Secondary | ICD-10-CM

## 2018-09-17 DIAGNOSIS — I2510 Atherosclerotic heart disease of native coronary artery without angina pectoris: Secondary | ICD-10-CM | POA: Diagnosis not present

## 2018-09-17 DIAGNOSIS — I447 Left bundle-branch block, unspecified: Secondary | ICD-10-CM

## 2018-09-17 DIAGNOSIS — Z95810 Presence of automatic (implantable) cardiac defibrillator: Secondary | ICD-10-CM | POA: Diagnosis not present

## 2018-09-17 LAB — CUP PACEART INCLINIC DEVICE CHECK
Battery Remaining Longevity: 39 mo
Brady Statistic RA Percent Paced: 9 %
Brady Statistic RV Percent Paced: 99.99 %
Date Time Interrogation Session: 20200713180423
HighPow Impedance: 70.875
Implantable Lead Implant Date: 20160406
Implantable Lead Implant Date: 20160406
Implantable Lead Implant Date: 20160406
Implantable Lead Location: 753858
Implantable Lead Location: 753859
Implantable Lead Location: 753860
Implantable Lead Model: 7122
Implantable Pulse Generator Implant Date: 20160406
Lead Channel Impedance Value: 1275 Ohm
Lead Channel Impedance Value: 375 Ohm
Lead Channel Impedance Value: 662.5 Ohm
Lead Channel Pacing Threshold Amplitude: 0.875 V
Lead Channel Pacing Threshold Amplitude: 0.875 V
Lead Channel Pacing Threshold Amplitude: 1 V
Lead Channel Pacing Threshold Pulse Width: 0.5 ms
Lead Channel Pacing Threshold Pulse Width: 0.5 ms
Lead Channel Pacing Threshold Pulse Width: 0.5 ms
Lead Channel Sensing Intrinsic Amplitude: 12 mV
Lead Channel Sensing Intrinsic Amplitude: 5 mV
Lead Channel Setting Pacing Amplitude: 2 V
Lead Channel Setting Pacing Amplitude: 2 V
Lead Channel Setting Pacing Amplitude: 2 V
Lead Channel Setting Pacing Pulse Width: 0.5 ms
Lead Channel Setting Pacing Pulse Width: 0.5 ms
Lead Channel Setting Sensing Sensitivity: 0.5 mV
Pulse Gen Serial Number: 7199559

## 2018-09-17 NOTE — Patient Instructions (Signed)

## 2018-09-17 NOTE — Progress Notes (Signed)
Patient Care Team: Rutherford Guys, MD as PCP - General (Family Medicine) Lorretta Harp, MD as Consulting Physician (Cardiology) Chucky May, MD as Consulting Physician (Psychiatry) Kristeen Miss, MD as Consulting Physician (Neurosurgery)   HPI  [name] is a 78 y.o. female Seen in follow-up for CRT- ICD implanted 4/16 for ischemic cardiomyopathy with prior stenting.  She has a history of congestive heart failure and left bundle branch block. Near normalization of LV function Prior syncope.    DATE TEST EF   4/16 Echo   20-25 %   8/17 Echo   50-55 %         Date Cr K TSH  2/19 1.28 4.6  8.440   Sep 12, 2022 1.19 3.8 1.64   Her son died a year or so ago and her husband died in 2022-07-21.  She is extremely remorseful about the circumstances of his dying, having been convinced to send him to a nursing home for some days.  Thankfully she was able to get him home for the last 2 days before he died.  She was married at the age of 9.     Past Medical History:  Diagnosis Date  . Anxiety   . Arthritis   . Chronic pain    a. Prior h/o chronic pain on methadone.  . Chronic systolic CHF (congestive heart failure) (HCC)    a. mixed ischemic/non-ischemic cardiomyopathy. b. s/p CRT-D in 07-21-14.  Marland Kitchen CKD (chronic kidney disease), stage III ([place])   . Complication of anesthesia    hard time waking her up from general surgery  . Coronary artery disease    a. remote RCA stenting in 2008 with non-DES. b. Cath 21-Jul-2014 following abnormal nuc: Stable, unchanged from prior cath, patent stent and 40% LM.  . Diabetes mellitus ([place])   . GERD (gastroesophageal reflux disease)   . Headache   . Hyperlipidemia   . Hypertension   . LBBB (left bundle branch block)   . Nonischemic cardiomyopathy ([place])   . OSA (obstructive sleep apnea)    AHI-9.77/hr, during REM-50.32/hr  . Tobacco abuse     Past Surgical History:  Procedure Laterality Date  . BACK SURGERY  2013  . BI-VENTRICULAR IMPLANTABLE  CARDIOVERTER DEFIBRILLATOR N/A 06/11/2014   STJ CRTD implanted by Dr Caryl Comes  . CARDIAC CATHETERIZATION  12/14/2006   RCA stented with a 3.0 [employer] Liberte stent resulting in a reduction of 75% to 0% residual  . CHOLECYSTECTOMY     20 years ago  . COLONOSCOPY WITH PROPOFOL N/A 12/15/2014   Procedure: COLONOSCOPY WITH PROPOFOL;  Surgeon: Clarene Essex, MD;  Location: [facility];  Service: Endoscopy;  Laterality: N/A;  . EYE SURGERY     bilateral cataract surgery   . LEFT AND RIGHT HEART CATHETERIZATION WITH CORONARY ANGIOGRAM N/A 05/29/2014   Procedure: LEFT AND RIGHT HEART CATHETERIZATION WITH CORONARY ANGIOGRAM;  Surgeon: Lorretta Harp, MD;  Location[facility] CATH LAB;  Service: Cardiovascular;  Laterality: N/A;  . LEFT HEART CATHETERIZATION WITH CORONARY ANGIOGRAM N/A 12/27/2012   Procedure: LEFT HEART CATHETERIZATION WITH CORONARY ANGIOGRAM;  Surgeon: Lorretta Harp, MD;  Location: [facility] CATH LAB;  Service: Cardiovascular;  Laterality: N/A;    Current Outpatient Medications  Medication Sig Dispense Refill  . ALPRAZolam (XANAX) 1 MG tablet Take 1 mg by mouth daily as needed.    Marland Kitchen aspirin EC 81 MG tablet Take 81 mg by mouth at bedtime.    . BD PEN NEEDLE MICRO  U/F 32G X 6 MM MISC USE NEW NEEDLE FOR EACH INJECTION OF INSULIN, FOUR TIMES A DAY 150 each 11  . blood glucose meter kit and supplies KIT Dispense based on patient and insurance preference. Use three times a day as directed. Dx. E11.65, Z79.4 1 each 11  . carvedilol (COREG) 12.5 MG tablet TAKE 1 & 1/2 (ONE & ONE-HALF) TABLETS BY MOUTH TWICE DAILY WITH A MEAL 270 tablet 3  . clopidogrel (PLAVIX) 75 MG tablet TAKE 1 TABLET BY MOUTH ONCE DAILY 90 tablet 3  . diclofenac sodium (VOLTAREN) 1 % GEL Apply 2 g topically 4 (four) times daily. 100 g 2  . EUTHYROX 88 MCG tablet TAKE 1 TABLET BY MOUTH AT BEDTIME 90 tablet 0  . furosemide (LASIX) 40 MG tablet TAKE 2 TABLETS BY MOUTH ONCE DAILY IN THE MORNING AND TAKE 1 TABLET BY MOUTH ONCE DAILY  IN THE EVENING 270 tablet 0  . insulin aspart (NOVOLOG FLEXPEN) 100 UNIT/ML FlexPen Per insulin sliding scale, max TTD, 20 units. 15 mL 11  . Insulin Detemir (LEVEMIR) 100 UNIT/ML Pen Inject 16 Units into the skin daily at 10 pm. 15 mL 3  . mirtazapine (REMERON) 30 MG tablet Take 0.5 tablets (15 mg total) by mouth at bedtime. 90 tablet 1  . mupirocin ointment (BACTROBAN) 2 % Apply 1 application topically 2 (two) times daily. 30 g 2  . nitroGLYCERIN (NITROSTAT) 0.4 MG SL tablet Place 1 tablet (0.4 mg total) under the tongue every 5 (five) minutes as needed for chest pain. 25 tablet 3  . ONE TOUCH ULTRA TEST test strip   11  . ONETOUCH DELICA LANCETS 01U MISC   11  . oxyCODONE-acetaminophen (PERCOCET/ROXICET) 5-325 MG tablet Take by mouth every 4 (four) hours as needed for severe pain.    . pantoprazole (PROTONIX) 40 MG tablet Take 40 mg by mouth 2 (two) times daily.     . potassium chloride SA (K-DUR) 20 MEQ tablet Take 1 tablet (20 mEq total) by mouth 2 (two) times daily. 180 tablet 0  . sertraline (ZOLOFT) 25 MG tablet Take 1 tablet (25 mg total) by mouth daily. 30 tablet 2  . simvastatin (ZOCOR) 20 MG tablet TAKE 1 TABLET BY MOUTH ONCE DAILY WITH SUPPER 90 tablet 3  . triamcinolone cream (KENALOG) 0.1 % APPLY ONE APPLICATION TOPICALLY TWO TIMES DAILY 30 g 0   No current facility-administered medications for this visit.     Allergies  Allergen Reactions  . Valium [Diazepam] Swelling    face      Review of Systems negative except from HPI and PMH  Physical Exam BP 102/70   Pulse 76   Ht '5\' 6"'  (1.676 m)   Wt 135 lb 12.8 oz (61.6 kg)   SpO2 99%   BMI 21.92 kg/m  Well developed and well nourished in no acute distress HENT normal Neck supple with JVP-flat Clear Device pocket well healed; without hematoma or erythema.  There is no tethering  Regular rate and rhythm, no  gallop No murmur Abd-soft with active BS No Clubbing cyanosis  edema Skin-warm and dry A & Oriented  Grossly  normal sensory and motor function  ECG     Assessment and  Plan Ischemic cardiomyopathy with interval normalization of LV function   Congestive heart failure-chronic-diastolic  Hypertension  Orthostatic hypotension  Diabetes  CRT-D-[employer]   Grief  Device function normal   Euvolemic continue current meds  BP well controlled  Without symptoms of ischemia  Grief counseling regarding the death of her husband   More than 50% of 40 min was spent in counseling related to the above

## 2018-09-20 ENCOUNTER — Other Ambulatory Visit: Payer: Self-pay

## 2018-09-20 ENCOUNTER — Ambulatory Visit (INDEPENDENT_AMBULATORY_CARE_PROVIDER_SITE_OTHER): Payer: Medicare HMO | Admitting: Family Medicine

## 2018-09-20 ENCOUNTER — Encounter: Payer: Self-pay | Admitting: Family Medicine

## 2018-09-20 VITALS — BP 121/73 | HR 71 | Temp 98.2°F | Ht 66.0 in | Wt 137.0 lb

## 2018-09-20 DIAGNOSIS — E1122 Type 2 diabetes mellitus with diabetic chronic kidney disease: Secondary | ICD-10-CM

## 2018-09-20 DIAGNOSIS — N183 Chronic kidney disease, stage 3 unspecified: Secondary | ICD-10-CM

## 2018-09-20 DIAGNOSIS — I10 Essential (primary) hypertension: Secondary | ICD-10-CM | POA: Diagnosis not present

## 2018-09-20 DIAGNOSIS — F4323 Adjustment disorder with mixed anxiety and depressed mood: Secondary | ICD-10-CM

## 2018-09-20 DIAGNOSIS — E039 Hypothyroidism, unspecified: Secondary | ICD-10-CM

## 2018-09-20 DIAGNOSIS — Z23 Encounter for immunization: Secondary | ICD-10-CM

## 2018-09-20 DIAGNOSIS — R69 Illness, unspecified: Secondary | ICD-10-CM | POA: Diagnosis not present

## 2018-09-20 DIAGNOSIS — Z794 Long term (current) use of insulin: Secondary | ICD-10-CM | POA: Diagnosis not present

## 2018-09-20 NOTE — Progress Notes (Signed)
 7/16/202011:49 AM  [name] 06/15/1940, 77 y.o., female [id number]  Chief Complaint  Patient presents with  . Diabetes  . Hyperlipidemia  . Anxiety    hs not began to take the zoloft, feels that may be too much with taking the ocycodone and all the other meds she is taking. Spoke witth cards about it and was told  thqat it would be fine to take the meds together    HPI:   Patient is a 77 y.o. female with past medical history significant for DM2 on insulin, osteomyelitis, CKD 3, CAD, CHF, HTN, HLP, OSA not on cpap, depression and anxiety who presents today for routine followup  Last OV July telemedicine Has not started zoloft yet as wanted to discussed with cards - he had no concerns Hypoglycemia has resolved, cbgs 90-145 levemir 16 units, not using novolog   Has appt with eye doctor in Aug, Dr Gould  Lab Results  Component Value Date   HGBA1C 5.9 (H) 05/05/2018   HGBA1C 6.1 (A) 11/27/2017   HGBA1C 6.4 07/21/2017   Lab Results  Component Value Date   LDLCALC 68 05/05/2018   CREATININE 1.19 (H) 08/20/2018   Lab Results  Component Value Date   TSH 1.640 08/20/2018    Depression screen PHQ 2/9 09/20/2018 09/06/2018 08/31/2018  Decreased Interest 0 2 0  Down, Depressed, Hopeless 0 3 0  PHQ - 2 Score 0 5 0  Altered sleeping - 0 -  Tired, decreased energy - 0 -  Change in appetite - 2 -  Feeling bad or failure about yourself  - 1 -  Trouble concentrating - 1 -  Moving slowly or fidgety/restless - 0 -  Suicidal thoughts - 0 -  PHQ-9 Score - 9 -  Difficult doing work/chores - Somewhat difficult -  Some recent data might be hidden    Fall Risk  09/20/2018 09/06/2018 08/31/2018 06/26/2018 05/15/2018  Falls in the past year? 0 0 0 0 0  Number falls in past yr: 0 - 0 - -  Injury with Fall? 0 - 0 0 0  Risk for fall due to : - - - - -  Follow up - - - - -     Allergies  Allergen Reactions  . Valium [Diazepam] Swelling    face    Prior to Admission medications    Medication Sig Start Date End Date Taking? Authorizing Provider  ALPRAZolam (XANAX) 1 MG tablet Take 1 mg by mouth daily as needed. 04/25/18  Yes [provider]  aspirin EC 81 MG tablet Take 81 mg by mouth at bedtime.   Yes [provider]  BD PEN NEEDLE MICRO U/F 32G X 6 MM MISC USE NEW NEEDLE FOR EACH INJECTION OF INSULIN, FOUR TIMES A DAY 07/21/18  Yes Santiago, Irma M, MD  blood glucose meter kit and supplies KIT Dispense based on patient and insurance preference. Use three times a day as directed. Dx. E11.65, Z79.4 05/15/18  Yes Santiago, Irma M, MD  carvedilol (COREG) 12.5 MG tablet TAKE 1 & 1/2 (ONE & ONE-HALF) TABLETS BY MOUTH TWICE DAILY WITH A MEAL 06/12/18  Yes Klein, Steven C, MD  clopidogrel (PLAVIX) 75 MG tablet TAKE 1 TABLET BY MOUTH ONCE DAILY 02/12/18  Yes Berry, Jonathan J, MD  diclofenac sodium (VOLTAREN) 1 % GEL Apply 2 g topically 4 (four) times daily. 05/22/17  Yes Santiago, Irma M, MD  EUTHYROX 88 MCG tablet TAKE 1 TABLET BY MOUTH AT BEDTIME 09/12/18    Yes Rutherford Guys, MD  furosemide (LASIX) 40 MG tablet TAKE 2 TABLETS BY MOUTH ONCE DAILY IN THE MORNING AND TAKE 1 TABLET BY MOUTH ONCE DAILY IN THE EVENING 08/06/18  Yes Lorretta Harp, MD  insulin aspart (NOVOLOG FLEXPEN) 100 UNIT/ML FlexPen Per insulin sliding scale, max TTD, 20 units. 06/26/17  Yes Rutherford Guys, MD  Insulin Detemir (LEVEMIR) 100 UNIT/ML Pen Inject 16 Units into the skin daily at 10 pm. 09/04/18  Yes Rutherford Guys, MD  mirtazapine (REMERON) 30 MG tablet Take 0.5 tablets (15 mg total) by mouth at bedtime. 09/06/18  Yes Rutherford Guys, MD  mupirocin ointment (BACTROBAN) 2 % Apply 1 application topically 2 (two) times daily. 07/21/17  Yes [name], DPM  nitroGLYCERIN (NITROSTAT) 0.4 MG SL tablet Place 1 tablet (0.4 mg total) under the tongue every 5 (five) minutes as needed for chest pain. 05/01/13  Yes Brett Canales, PA-C  ONE TOUCH ULTRA TEST test strip  06/07/17  Yes [provider]  Jonetta Speak LANCETS [address] [place]  06/07/17  Yes [provider]  oxyCODONE-acetaminophen (PERCOCET/ROXICET) 5-325 MG tablet Take by mouth every 4 (four) hours as needed for severe pain.   Yes [provider]  potassium chloride SA (K-DUR) 20 MEQ tablet Take 1 tablet (20 mEq total) by mouth 2 (two) times daily. 08/23/18  Yes Rutherford Guys, MD  simvastatin (ZOCOR) 20 MG tablet TAKE 1 TABLET BY MOUTH ONCE DAILY WITH SUPPER 06/25/18  Yes Rutherford Guys, MD  triamcinolone cream (KENALOG) 0.1 % APPLY ONE APPLICATION TOPICALLY TWO TIMES DAILY 06/18/18  Yes Rutherford Guys, MD  sertraline (ZOLOFT) 25 MG tablet Take 1 tablet (25 mg total) by mouth daily. Patient not taking: Reported on 09/20/2018 09/06/18   Rutherford Guys, MD    Past Medical History:  Diagnosis Date  . Anxiety   . Arthritis   . Chronic pain    a. Prior h/o chronic pain on methadone.  . Chronic systolic CHF (congestive heart failure) (HCC)    a. mixed ischemic/non-ischemic cardiomyopathy. b. s/p CRT-D in 06/2014.  Marland Kitchen CKD (chronic kidney disease), stage III ([place])   . Complication of anesthesia    hard time waking her up from general surgery  . Coronary artery disease    a. remote RCA stenting in 2008 with non-DES. b. Cath 06/2014 following abnormal nuc: Stable, unchanged from prior cath, patent stent and 40% LM.  . Diabetes mellitus ([place])   . GERD (gastroesophageal reflux disease)   . Headache   . Hyperlipidemia   . Hypertension   . LBBB (left bundle branch block)   . Nonischemic cardiomyopathy ([place])   . OSA (obstructive sleep apnea)    AHI-9.77/hr, during REM-50.32/hr  . Tobacco abuse     Past Surgical History:  Procedure Laterality Date  . BACK SURGERY  2013  . BI-VENTRICULAR IMPLANTABLE CARDIOVERTER DEFIBRILLATOR N/A 06/11/2014   STJ CRTD implanted by Dr Caryl Comes  . CARDIAC CATHETERIZATION  12/14/2006   RCA stented with a 3.0 [employer] Liberte stent resulting in a reduction of 75% to  0% residual  . CHOLECYSTECTOMY     20 years ago  . COLONOSCOPY WITH PROPOFOL N/A 12/15/2014   Procedure: COLONOSCOPY WITH PROPOFOL;  Surgeon: Clarene Essex, MD;  Location: [facility] ENDOSCOPY;  Service: Endoscopy;  Laterality: N/A;  . EYE SURGERY     bilateral cataract surgery   . LEFT AND RIGHT HEART CATHETERIZATION WITH CORONARY ANGIOGRAM N/A 05/29/2014  Procedure: LEFT AND RIGHT HEART CATHETERIZATION WITH CORONARY ANGIOGRAM;  Surgeon: Lorretta Harp, MD;  Location: [facility] CATH LAB;  Service: Cardiovascular;  Laterality: N/A;  . LEFT HEART CATHETERIZATION WITH CORONARY ANGIOGRAM N/A 12/27/2012   Procedure: LEFT HEART CATHETERIZATION WITH CORONARY ANGIOGRAM;  Surgeon: Lorretta Harp, MD;  Location: [facility] CATH LAB;  Service: Cardiovascular;  Laterality: N/A;    Social History   Tobacco Use  . Smoking status: Current Every Day Smoker    Packs/day: 2.00    Years: 58.00    Pack years: 116.00    Types: Cigarettes    Start date: 12/26/1952    Last attempt to quit: 12/05/2013    Years since quitting: 4.7  . Smokeless tobacco: Never Used  . Tobacco comment: uses Vape cigarettes  Substance Use Topics  . Alcohol use: No    Alcohol/week: 0.0 standard drinks    Family History  Problem Relation Age of Onset  . Stroke Mother   . Hypertension Mother   . Coronary artery disease Father   . Stroke Brother   . Heart disease Brother   . Other Brother        H1N1 VIRUS  . Healthy Sister   . Healthy Sister     Review of Systems  Constitutional: Negative for chills and fever.  Respiratory: Negative for cough and shortness of breath.   Cardiovascular: Negative for chest pain, palpitations and leg swelling.  Gastrointestinal: Negative for abdominal pain, nausea and vomiting.     OBJECTIVE:  Today's Vitals   09/20/18 1138  BP: 121/73  Pulse: 71  Temp: 98.2 F (36.8 C)  TempSrc: Oral  SpO2: 97%  Weight: 137 lb (62.1 kg)  Height: 5' 6" (1.676 m)   Body mass index is 22.11 kg/m.  Wt  Readings from Last 3 Encounters:  09/20/18 137 lb (62.1 kg)  09/17/18 135 lb 12.8 oz (61.6 kg)  08/20/18 138 lb (62.6 kg)   Physical Exam Vitals signs and nursing note reviewed.  Constitutional:      Appearance: She is well-developed.  HENT:     Head: Normocephalic and atraumatic.     Mouth/Throat:     Pharynx: No oropharyngeal exudate.  Eyes:     General: No scleral icterus.    Conjunctiva/sclera: Conjunctivae normal.     Pupils: Pupils are equal, round, and reactive to light.  Neck:     Musculoskeletal: Neck supple.  Cardiovascular:     Rate and Rhythm: Normal rate and regular rhythm.     Heart sounds: Normal heart sounds. No murmur. No friction rub. No gallop.   Pulmonary:     Effort: Pulmonary effort is normal.     Breath sounds: Normal breath sounds. No wheezing or rales.  Skin:    General: Skin is warm and dry.  Neurological:     Mental Status: She is alert and oriented to person, place, and time.       ASSESSMENT and PLAN  1. Adjustment reaction with anxiety and depression Reviewed sertraline again, agreed trial  2. Need for vaccination - Pneumococcal conjugate vaccine 13-valent IM  3. Hypothyroidism, unspecified type Controlled. Continue current regime.   4. Type 2 diabetes mellitus with stage 3 chronic kidney disease, with long-term current use of insulin (HCC) Hypoglycemia resolved. Home cbgs at goal. A1c due in 2 months.  5. Essential hypertension Controlled. Continue current regime.   Return in about 3 months (around 12/21/2018).    Rutherford Guys, MD [facility] [place]  [address] [place], [place] [address] Ph.  [phone] Fax [phone]   

## 2018-09-20 NOTE — Patient Instructions (Signed)
° ° ° °  If you have lab work done today you will be contacted with your lab results within the next 2 weeks.  If you have not heard from us then please contact us. The fastest way to get your results is to register for My Chart. ° ° °IF you received an x-ray today, you will receive an invoice from [facility]. Please contact [facility] at [phone] with questions or concerns regarding your invoice.  ° °IF you received labwork today, you will receive an invoice from [facility]. Please contact [facility] at [phone] with questions or concerns regarding your invoice.  ° °Our billing staff will not be able to assist you with questions regarding bills from these companies. ° °You will be contacted with the lab results as soon as they are available. The fastest way to get your results is to activate your My Chart account. Instructions are located on the last page of this paperwork. If you have not heard from us regarding the results in 2 weeks, please contact this office. °  ° ° ° °

## 2018-09-24 ENCOUNTER — Ambulatory Visit: Payer: Medicare HMO | Admitting: Family Medicine

## 2018-09-24 ENCOUNTER — Other Ambulatory Visit: Payer: Medicare HMO

## 2018-09-25 ENCOUNTER — Encounter: Payer: Self-pay | Admitting: Family Medicine

## 2018-09-27 DIAGNOSIS — R69 Illness, unspecified: Secondary | ICD-10-CM | POA: Diagnosis not present

## 2018-10-02 DIAGNOSIS — E119 Type 2 diabetes mellitus without complications: Secondary | ICD-10-CM | POA: Diagnosis not present

## 2018-10-02 DIAGNOSIS — H524 Presbyopia: Secondary | ICD-10-CM | POA: Diagnosis not present

## 2018-10-02 DIAGNOSIS — H5213 Myopia, bilateral: Secondary | ICD-10-CM | POA: Diagnosis not present

## 2018-10-02 DIAGNOSIS — H26491 Other secondary cataract, right eye: Secondary | ICD-10-CM | POA: Diagnosis not present

## 2018-10-02 LAB — HM DIABETES EYE EXAM

## 2018-10-04 ENCOUNTER — Inpatient Hospital Stay: Admission: RE | Admit: 2018-10-04 | Payer: Medicare HMO | Source: Ambulatory Visit

## 2018-10-09 ENCOUNTER — Ambulatory Visit: Payer: Medicare HMO | Admitting: Cardiovascular Disease

## 2018-10-11 DIAGNOSIS — Z961 Presence of intraocular lens: Secondary | ICD-10-CM | POA: Diagnosis not present

## 2018-10-11 DIAGNOSIS — H26491 Other secondary cataract, right eye: Secondary | ICD-10-CM | POA: Diagnosis not present

## 2018-10-22 ENCOUNTER — Ambulatory Visit (INDEPENDENT_AMBULATORY_CARE_PROVIDER_SITE_OTHER): Payer: Medicare HMO

## 2018-10-22 DIAGNOSIS — I5022 Chronic systolic (congestive) heart failure: Secondary | ICD-10-CM | POA: Diagnosis not present

## 2018-10-22 DIAGNOSIS — Z95810 Presence of automatic (implantable) cardiac defibrillator: Secondary | ICD-10-CM

## 2018-10-24 ENCOUNTER — Telehealth: Payer: Self-pay | Admitting: Cardiovascular Disease

## 2018-10-24 ENCOUNTER — Encounter: Payer: Self-pay | Admitting: Registered Nurse

## 2018-10-24 ENCOUNTER — Other Ambulatory Visit: Payer: Self-pay

## 2018-10-24 ENCOUNTER — Telehealth: Payer: Self-pay

## 2018-10-24 ENCOUNTER — Ambulatory Visit (INDEPENDENT_AMBULATORY_CARE_PROVIDER_SITE_OTHER): Payer: Medicare HMO | Admitting: Registered Nurse

## 2018-10-24 VITALS — BP 102/58 | HR 86 | Temp 98.4°F | Resp 16 | Ht 66.0 in | Wt 136.0 lb

## 2018-10-24 DIAGNOSIS — I959 Hypotension, unspecified: Secondary | ICD-10-CM

## 2018-10-24 NOTE — Telephone Encounter (Signed)
STAT if HR is under 50 or over 120 (normal HR is 60-100 beats per minute)  1) What is your heart rate? 103, at 9:15 A.M.  2) Do you have a log of your heart rate readings (document readings)? no  3) Do you have any other symptoms?  Headache and blood pressure running low. Last night it was 88/54- pt wants to know if she needs to be seen

## 2018-10-24 NOTE — Telephone Encounter (Signed)
Remote ICM transmission received.  Attempted call to patient regarding ICM remote transmission and no answer or option to leave voice mail message.   

## 2018-10-24 NOTE — Patient Instructions (Signed)
° ° ° °  If you have lab work done today you will be contacted with your lab results within the next 2 weeks.  If you have not heard from us then please contact us. The fastest way to get your results is to register for My Chart. ° ° °IF you received an x-ray today, you will receive an invoice from [facility]. Please contact [facility] at [phone] with questions or concerns regarding your invoice.  ° °IF you received labwork today, you will receive an invoice from [facility]. Please contact [facility] at [phone] with questions or concerns regarding your invoice.  ° °Our billing staff will not be able to assist you with questions regarding bills from these companies. ° °You will be contacted with the lab results as soon as they are available. The fastest way to get your results is to activate your My Chart account. Instructions are located on the last page of this paperwork. If you have not heard from us regarding the results in 2 weeks, please contact this office. °  ° ° ° °

## 2018-10-24 NOTE — Telephone Encounter (Signed)
We will see what her PCP says this morning

## 2018-10-24 NOTE — Progress Notes (Signed)
EPIC Encounter for ICM Monitoring  Patient Name: [name] is a 78 y.o. female Date: 10/24/2018 Primary Care Physican: Rutherford Guys, MD Primary Cardiologist:Berry Electrophysiologist: Vergie Living Pacing: >99% 09/11/2018 Weight:136 lbs  Attempted call to patient and unable to reach.   Transmission reviewed.   [place] thoracic impedance normal.   Prescribed:Furosemide 40 mg 2 tablets (80 mg total) every AMand 1 tablet (40 mg total) every PM. Potassium 20 mEq 1 tablet daily.  Labs: 05/05/2018 Creatinine 1.36, BUN 19, Potassium 3.9, Sodium 146, GFR 38-44 11/27/2017 Creatinine 1.20, BUN 11, Potassium 4.1, Sodium 146, GFR 44-21 07/21/2017 Creatinine 1.26, BUN 16, Potassium 4.2, Sodium 143, GFR 42-48 06/22/2017 Creatinine 1.12, BUN 12, Potassium 3.2, Sodium 144, GFR 48-56 04/21/2017 Creatinine 1.28, BUN 15, Potassium 4.6, Sodium 140, GFR 41-47 A complete set of results can be found in Results Review.  Recommendations: Unable to reach.    Follow-up plan: ICM clinic phone appointment on10/19/2020. 91 day remote check 11/06/2018  Copy of ICM check sent to [facility].  3 month ICM trend: 10/22/2018    1 Year ICM trend:       [name], RN 10/24/2018 10:59 AM

## 2018-10-24 NOTE — Telephone Encounter (Signed)
Returned call to pt she states that for the last couple days she has had a headache so yesterday she took her BP/HR her BP usually runs low last night it was 89/53 @130am . She takes all her medications as ordered, carvedilol 1-1/2tab BID and Plavix and simvastatin all as orderedShe states that this was after eating popcorn to see if that would help to bring BP up. She states that she could not sleep, she usually has insomnia and takes xanax and she was afraid to take because she was told it would bring her BP down even further. She finally took a 1/2 of her xanax and was abe to fall asleep at about 445am. She states that she woke up this am and her Bp came up to 127/74 HR 103 she is still concerned about her HR and headache. She has an appt with her PCP this morning @1120  she will go to discuss this with her and let us know what she directs her to do. Anything further that she can do?

## 2018-10-24 NOTE — Progress Notes (Signed)
Acute Office Visit  Subjective:    Patient ID: [name], female    DOB: Dec 31, 1940, 78 y.o.   MRN: [id number]  Chief Complaint  Patient presents with  . Hypotension    pt states she has been concerned about B/P being to low x 3 days     HPI Patient is in today for hypotension  Reports that she has taken her blood pressure at home and it has run as low as high 80s/  High 50s. She reports that she has had some lightheadedness, but denies chest pain, headaches, swelling of legs, changes to vision.   She has a somewhat complicated medical history that also includes hypothyroidism and T2DM. She reports her blood glucose readings have been "all over", but she denies symptoms of hyperglycemia, hypoglycemia, and thyroid dysfunction.   Past Medical History:  Diagnosis Date  . Anxiety   . Arthritis   . Chronic pain    a. Prior h/o chronic pain on methadone.  . Chronic systolic CHF (congestive heart failure) (HCC)    a. mixed ischemic/non-ischemic cardiomyopathy. b. s/p CRT-D in 06/2014.  Marland Kitchen CKD (chronic kidney disease), stage III ([place])   . Complication of anesthesia    hard time waking her up from general surgery  . Coronary artery disease    a. remote RCA stenting in 2008 with non-DES. b. Cath 06/2014 following abnormal nuc: Stable, unchanged from prior cath, patent stent and 40% LM.  . Diabetes mellitus ([place])   . GERD (gastroesophageal reflux disease)   . Headache   . Hyperlipidemia   . Hypertension   . LBBB (left bundle branch block)   . Nonischemic cardiomyopathy ([place])   . OSA (obstructive sleep apnea)    AHI-9.77/hr, during REM-50.32/hr  . Tobacco abuse     Past Surgical History:  Procedure Laterality Date  . BACK SURGERY  2013  . BI-VENTRICULAR IMPLANTABLE CARDIOVERTER DEFIBRILLATOR N/A 06/11/2014   STJ CRTD implanted by Dr Caryl Comes  . CARDIAC CATHETERIZATION  12/14/2006   RCA stented with a 3.0 [employer] Liberte stent resulting in a reduction of 75% to 0% residual   . CHOLECYSTECTOMY     20 years ago  . COLONOSCOPY WITH PROPOFOL N/A 12/15/2014   Procedure: COLONOSCOPY WITH PROPOFOL;  Surgeon: Clarene Essex, MD;  Location: [facility];  Service: Endoscopy;  Laterality: N/A;  . EYE SURGERY     bilateral cataract surgery   . LEFT AND RIGHT HEART CATHETERIZATION WITH CORONARY ANGIOGRAM N/A 05/29/2014   Procedure: LEFT AND RIGHT HEART CATHETERIZATION WITH CORONARY ANGIOGRAM;  Surgeon: Lorretta Harp, MD;  Location: [facility] CATH LAB;  Service: Cardiovascular;  Laterality: N/A;  . LEFT HEART CATHETERIZATION WITH CORONARY ANGIOGRAM N/A 12/27/2012   Procedure: LEFT HEART CATHETERIZATION WITH CORONARY ANGIOGRAM;  Surgeon: Lorretta Harp, MD;  Location: [facility] CATH LAB;  Service: Cardiovascular;  Laterality: N/A;    Family History  Problem Relation Age of Onset  . Stroke Mother   . Hypertension Mother   . Coronary artery disease Father   . Stroke Brother   . Heart disease Brother   . Other Brother        H1N1 VIRUS  . Healthy Sister   . Healthy Sister     Social History   Socioeconomic History  . Marital status: Married    Spouse name: Not on file  . Number of children: Not on file  . Years of education: Not on file  . Highest education level: Not on file  Occupational History  . Not on file  Social Needs  . Financial resource strain: Not on file  . Food insecurity    Worry: Not on file    Inability: Not on file  . Transportation needs    Medical: Not on file    Non-medical: Not on file  Tobacco Use  . Smoking status: Current Every Day Smoker    Packs/day: 2.00    Years: 58.00    Pack years: 116.00    Types: Cigarettes    Start date: 12/26/1952    Last attempt to quit: 12/05/2013    Years since quitting: 4.8  . Smokeless tobacco: Never Used  . Tobacco comment: uses Vape cigarettes  Substance and Sexual Activity  . Alcohol use: No    Alcohol/week: 0.0 standard drinks  . Drug use: No  . Sexual activity: Not on file  Lifestyle  . Physical  activity    Days per week: Not on file    Minutes per session: Not on file  . Stress: Not on file  Relationships  . Social Herbalist on phone: Not on file    Gets together: Not on file    Attends religious service: Not on file    Active member of club or organization: Not on file    Attends meetings of clubs or organizations: Not on file    Relationship status: Not on file  . Intimate partner violence    Fear of current or ex partner: Not on file    Emotionally abused: Not on file    Physically abused: Not on file    Forced sexual activity: Not on file  Other Topics Concern  . Not on file  Social History Narrative   Patient lives in [place] with his husband and grandson.   Son completed suicide on September 17th of this year, patient found him.    Outpatient Medications Prior to Visit  Medication Sig Dispense Refill  . ALPRAZolam (XANAX) 1 MG tablet Take 1 mg by mouth daily as needed.    Marland Kitchen aspirin EC 81 MG tablet Take 81 mg by mouth at bedtime.    . BD PEN NEEDLE MICRO U/F 32G X 6 MM MISC USE NEW NEEDLE FOR EACH INJECTION OF INSULIN, FOUR TIMES A DAY 150 each 11  . blood glucose meter kit and supplies KIT Dispense based on patient and insurance preference. Use three times a day as directed. Dx. E11.65, Z79.4 1 each 11  . carvedilol (COREG) 12.5 MG tablet TAKE 1 & 1/2 (ONE & ONE-HALF) TABLETS BY MOUTH TWICE DAILY WITH A MEAL 270 tablet 3  . clopidogrel (PLAVIX) 75 MG tablet TAKE 1 TABLET BY MOUTH ONCE DAILY 90 tablet 3  . diclofenac sodium (VOLTAREN) 1 % GEL Apply 2 g topically 4 (four) times daily. 100 g 2  . EUTHYROX 88 MCG tablet TAKE 1 TABLET BY MOUTH AT BEDTIME 90 tablet 0  . furosemide (LASIX) 40 MG tablet TAKE 2 TABLETS BY MOUTH ONCE DAILY IN THE MORNING AND TAKE 1 TABLET BY MOUTH ONCE DAILY IN THE EVENING 270 tablet 0  . insulin aspart (NOVOLOG FLEXPEN) 100 UNIT/ML FlexPen Per insulin sliding scale, max TTD, 20 units. 15 mL 11  . Insulin Detemir (LEVEMIR) 100  UNIT/ML Pen Inject 16 Units into the skin daily at 10 pm. 15 mL 3  . mirtazapine (REMERON) 30 MG tablet Take 0.5 tablets (15 mg total) by mouth at bedtime. 90 tablet 1  . mupirocin ointment (  BACTROBAN) 2 % Apply 1 application topically 2 (two) times daily. 30 g 2  . nitroGLYCERIN (NITROSTAT) 0.4 MG SL tablet Place 1 tablet (0.4 mg total) under the tongue every 5 (five) minutes as needed for chest pain. 25 tablet 3  . ONE TOUCH ULTRA TEST test strip   11  . ONETOUCH DELICA LANCETS 33G MISC   11  . oxyCODONE-acetaminophen (PERCOCET/ROXICET) 5-325 MG tablet Take by mouth every 4 (four) hours as needed for severe pain.    . potassium chloride SA (K-DUR) 20 MEQ tablet Take 1 tablet (20 mEq total) by mouth 2 (two) times daily. 180 tablet 0  . sertraline (ZOLOFT) 25 MG tablet Take 1 tablet (25 mg total) by mouth daily. 30 tablet 2  . simvastatin (ZOCOR) 20 MG tablet TAKE 1 TABLET BY MOUTH ONCE DAILY WITH SUPPER 90 tablet 3  . triamcinolone cream (KENALOG) 0.1 % APPLY ONE APPLICATION TOPICALLY TWO TIMES DAILY 30 g 0   No facility-administered medications prior to visit.     Allergies  Allergen Reactions  . Valium [Diazepam] Swelling    face    ROS Per hpi    Objective:    Physical Exam  Constitutional: She is oriented to person, place, and time. She appears well-developed and well-nourished. No distress.  Cardiovascular: Normal rate, regular rhythm, S1 normal, S2 normal and intact distal pulses.  No extrasystoles are present. PMI is not displaced. Exam reveals distant heart sounds. Exam reveals no gallop and no friction rub.  No murmur heard. Pulmonary/Chest: Breath sounds normal. No respiratory distress.  Musculoskeletal:        General: No edema.  Neurological: She is alert and oriented to person, place, and time.  Skin: Skin is warm and dry. No rash noted. She is not diaphoretic. No erythema. No pallor.  Psychiatric: She has a normal mood and affect. Her behavior is normal. Judgment and  thought content normal.  Nursing note and vitals reviewed.   BP (!) 102/58   Pulse 86   Temp 98.4 F (36.9 C) (Oral)   Resp 16   Ht 5' 6" (1.676 m)   Wt 136 lb (61.7 kg)   SpO2 96%   BMI 21.95 kg/m  Wt Readings from Last 3 Encounters:  10/24/18 136 lb (61.7 kg)  09/20/18 137 lb (62.1 kg)  09/17/18 135 lb 12.8 oz (61.6 kg)    Health Maintenance Due  Topic Date Due  . INFLUENZA VACCINE  10/06/2018    There are no preventive care reminders to display for this patient.   Lab Results  Component Value Date   TSH 1.640 08/20/2018   Lab Results  Component Value Date   WBC 4.2 07/21/2017   HGB 13.4 07/21/2017   HCT 41.8 07/21/2017   MCV 93 07/21/2017   PLT 201 07/21/2017   Lab Results  Component Value Date   NA 144 08/20/2018   K 3.8 08/20/2018   CO2 27 08/20/2018   GLUCOSE 60 (L) 08/20/2018   BUN 13 08/20/2018   CREATININE 1.19 (H) 08/20/2018   BILITOT 0.5 08/20/2018   ALKPHOS 79 08/20/2018   AST 16 08/20/2018   ALT 11 08/20/2018   PROT 6.5 08/20/2018   ALBUMIN 4.1 08/20/2018   CALCIUM 10.2 08/20/2018   ANIONGAP 12 08/31/2016   GFR 35.10 (L) 10/06/2014   Lab Results  Component Value Date   CHOL 130 05/05/2018   Lab Results  Component Value Date   HDL 49 05/05/2018   Lab Results  Component   Value Date   LDLCALC 68 05/05/2018   Lab Results  Component Value Date   TRIG 67 05/05/2018   Lab Results  Component Value Date   CHOLHDL 2.7 05/05/2018   Lab Results  Component Value Date   HGBA1C 5.9 (H) 05/05/2018       Assessment & Plan:   Problem List Items Addressed This Visit    None    Visit Diagnoses    Hypotension, unspecified hypotension type    -  Primary   Relevant Orders   TSH   CMP14+EGFR   Hemoglobin A1c       No orders of the defined types were placed in this encounter.  PLAN  Pt is currently taking 15m Lasix PO daily, 467min am and 2063mn pm. Will try reducing this to 82m75m bid and she will follow up in 1 week. She  additionally has a follow up with her PCP, Dr SantPamella Pert Monday.   We encouraged adequate hydration - she is limited due to CHF, but she may mildly increase salt intake to try to improve fluid volume status as we suspect dehydration is partially to blame for hypotension  Discussed reasons for ED presentation  Patient encouraged to call clinic with any questions, comments, or concerns.    RichMaximiano Coss

## 2018-10-25 LAB — HEMOGLOBIN A1C
Est. average glucose Bld gHb Est-mCnc: 108 mg/dL
Hgb A1c MFr Bld: 5.4 % (ref 4.8–5.6)

## 2018-10-25 LAB — CMP14+EGFR
ALT: 13 IU/L (ref 0–32)
AST: 13 IU/L (ref 0–40)
Albumin/Globulin Ratio: 2 (ref 1.2–2.2)
Albumin: 4.2 g/dL (ref 3.7–4.7)
Alkaline Phosphatase: 80 IU/L (ref 39–117)
BUN/Creatinine Ratio: 13 (ref 12–28)
BUN: 15 mg/dL (ref 8–27)
Bilirubin Total: 0.5 mg/dL (ref 0.0–1.2)
CO2: 24 mmol/L (ref 20–29)
Calcium: 9.7 mg/dL (ref 8.7–10.3)
Chloride: 101 mmol/L (ref 96–106)
Creatinine, Ser: 1.13 mg/dL — ABNORMAL HIGH (ref 0.57–1.00)
GFR calc Af Amer: 55 mL/min/{1.73_m2} — ABNORMAL LOW (ref >59)
GFR calc non Af Amer: 47 mL/min/{1.73_m2} — ABNORMAL LOW (ref >59)
Globulin, Total: 2.1 g/dL (ref 1.5–4.5)
Glucose: 108 mg/dL — ABNORMAL HIGH (ref 65–99)
Potassium: 4.1 mmol/L (ref 3.5–5.2)
Sodium: 141 mmol/L (ref 134–144)
Total Protein: 6.3 g/dL (ref 6.0–8.5)

## 2018-10-25 LAB — TSH: TSH: 1.6 u[IU]/mL (ref 0.450–4.500)

## 2018-10-26 ENCOUNTER — Encounter: Payer: Self-pay | Admitting: Registered Nurse

## 2018-10-26 NOTE — Progress Notes (Signed)
Results letter sent to patient via [place], NP

## 2018-10-29 ENCOUNTER — Telehealth (INDEPENDENT_AMBULATORY_CARE_PROVIDER_SITE_OTHER): Payer: Medicare HMO | Admitting: Family Medicine

## 2018-10-29 ENCOUNTER — Other Ambulatory Visit: Payer: Self-pay

## 2018-10-29 ENCOUNTER — Encounter: Payer: Self-pay | Admitting: Family Medicine

## 2018-10-29 DIAGNOSIS — R200 Anesthesia of skin: Secondary | ICD-10-CM | POA: Diagnosis not present

## 2018-10-29 DIAGNOSIS — I959 Hypotension, unspecified: Secondary | ICD-10-CM

## 2018-10-29 NOTE — Patient Instructions (Signed)
° ° ° °  If you have lab work done today you will be contacted with your lab results within the next 2 weeks.  If you have not heard from us then please contact us. The fastest way to get your results is to register for My Chart. ° ° °IF you received an x-ray today, you will receive an invoice from [facility]. Please contact [facility] at [phone] with questions or concerns regarding your invoice.  ° °IF you received labwork today, you will receive an invoice from [facility]. Please contact [facility] at [phone] with questions or concerns regarding your invoice.  ° °Our billing staff will not be able to assist you with questions regarding bills from these companies. ° °You will be contacted with the lab results as soon as they are available. The fastest way to get your results is to activate your My Chart account. Instructions are located on the last page of this paperwork. If you have not heard from us regarding the results in 2 weeks, please contact this office. °  ° ° ° °

## 2018-10-29 NOTE — Progress Notes (Signed)
Pt has concern that the left leg is bigger than the right. She is also having numbness in the feet although she can still feel somewhat. Wants to know with the numbness and pain is associated with. Concerned about hart and dm. She says when she went to a cards appt, all was well and was not having the problem she is having  now

## 2018-10-29 NOTE — Progress Notes (Signed)
neuropoythy going up legs/ a1c update Medication management

## 2018-10-29 NOTE — Progress Notes (Signed)
Virtual Visit Note  I connected with patient on 10/29/18 at 558pm by phone and verified that I am speaking with the correct person using two identifiers. [name] is currently located at home and patient is currently with them during visit. The provider, Rutherford Guys, MD is located in their office at time of visit.  I discussed the limitations, risks, security and privacy concerns of performing an evaluation and management service by telephone and the availability of in person appointments. I also discussed with the patient that there may be a patient responsible charge related to this service. The patient expressed understanding and agreed to proceed.   CC: low bp  HPI ? 78yo F with PMHx of DM2 on insulin, osteomyelitis, CKD 3, CAD, CHF, HTN, HLP, OSA not on cpap, depression and anxiety who presents today for low BP and numbness of her feet  Has been having recurring low BP, 87/45, with arm cuff. Tried to push fluids, increase salt. Allison Albe, NP, lasix decreased to 34m AM and 285mPM Last night 116/64, HR 74, she has reached out to cards She denies any dizziness, fatigue, palpitations, CP, SOB Has noticed very minimal edema on her legs, L>R, but that was present prior to decrease in lasix She did have a HA on day 87/45 She weighs herself daily, it has been stable  Having progression of numbness of her feet Denies any burning pain or tingling Now up Left calf, no redness or warmth  Not worse with walking Long standing DM2, very well controlled H/o DDD, s/p surgery x 2  Lab Results  Component Value Date   CREATININE 1.13 (H) 10/24/2018   BUN 15 10/24/2018   NA 141 10/24/2018   K 4.1 10/24/2018   CL 101 10/24/2018   CO2 24 10/24/2018    Allergies  Allergen Reactions  . Valium [Diazepam] Swelling    face    Prior to Admission medications   Medication Sig Start Date End Date Taking? Authorizing Provider  ALPRAZolam (XDuanne Moron1 MG tablet Take 1 mg by mouth daily as  needed. 04/25/18  Yes [provider]  aspirin EC 81 MG tablet Take 81 mg by mouth at bedtime.   Yes [provider]  BD PEN NEEDLE MICRO U/F 32G X 6 MM MISC USE NEW NEEDLE FOR EACH INJECTION OF INSULIN, FOUR TIMES A DAY 07/21/18  Yes SaRutherford GuysMD  blood glucose meter kit and supplies KIT Dispense based on patient and insurance preference. Use three times a day as directed. Dx. E11.65, Z79.4 05/15/18  Yes SaRutherford GuysMD  carvedilol (COREG) 12.5 MG tablet TAKE 1 & 1/2 (ONE & ONE-HALF) TABLETS BY MOUTH TWICE DAILY WITH A MEAL 06/12/18  Yes KlDeboraha SprangMD  clopidogrel (PLAVIX) 75 MG tablet TAKE 1 TABLET BY MOUTH ONCE DAILY 02/12/18  Yes BeLorretta HarpMD  diclofenac sodium (VOLTAREN) 1 % GEL Apply 2 g topically 4 (four) times daily. 05/22/17  Yes SaRutherford GuysMD  EUTHYROX 88 MCG tablet TAKE 1 TABLET BY MOUTH AT BEDTIME 09/12/18  Yes SaRutherford GuysMD  furosemide (LASIX) 40 MG tablet TAKE 2 TABLETS BY MOUTH ONCE DAILY IN THE MORNING AND TAKE 1 TABLET BY MOUTH ONCE DAILY IN THE EVENING 08/06/18  Yes BeLorretta HarpMD  insulin aspart (NOVOLOG FLEXPEN) 100 UNIT/ML FlexPen Per insulin sliding scale, max TTD, 20 units. 06/26/17  Yes SaRutherford GuysMD  Insulin Detemir (LEVEMIR) 100 UNIT/ML Pen Inject  16 Units into the skin daily at 10 pm. 09/04/18  Yes Rutherford Guys, MD  mirtazapine (REMERON) 30 MG tablet Take 0.5 tablets (15 mg total) by mouth at bedtime. 09/06/18  Yes Rutherford Guys, MD  nitroGLYCERIN (NITROSTAT) 0.4 MG SL tablet Place 1 tablet (0.4 mg total) under the tongue every 5 (five) minutes as needed for chest pain. 05/01/13  Yes Brett Canales, PA-C  ONE TOUCH ULTRA TEST test strip  06/07/17  Yes [provider]  Jonetta Speak LANCETS 37D [place]  06/07/17  Yes [provider]  oxyCODONE-acetaminophen (PERCOCET/ROXICET) 5-325 MG tablet Take by mouth every 4 (four) hours as needed for severe pain.   Yes [provider]  potassium  chloride SA (K-DUR) 20 MEQ tablet Take 1 tablet (20 mEq total) by mouth 2 (two) times daily. 08/23/18  Yes Rutherford Guys, MD  simvastatin (ZOCOR) 20 MG tablet TAKE 1 TABLET BY MOUTH ONCE DAILY WITH SUPPER 06/25/18  Yes Rutherford Guys, MD  triamcinolone cream (KENALOG) 0.1 % APPLY ONE APPLICATION TOPICALLY TWO TIMES DAILY 06/18/18  Yes Rutherford Guys, MD  mupirocin ointment (BACTROBAN) 2 % Apply 1 application topically 2 (two) times daily. Patient not taking: Reported on 10/29/2018 07/21/17   [name], DPM  sertraline (ZOLOFT) 25 MG tablet Take 1 tablet (25 mg total) by mouth daily. Patient not taking: Reported on 10/29/2018 09/06/18   Rutherford Guys, MD    Past Medical History:  Diagnosis Date  . Anxiety   . Arthritis   . Chronic pain    a. Prior h/o chronic pain on methadone.  . Chronic systolic CHF (congestive heart failure) (HCC)    a. mixed ischemic/non-ischemic cardiomyopathy. b. s/p CRT-D in 06/2014.  Marland Kitchen CKD (chronic kidney disease), stage III ([place])   . Complication of anesthesia    hard time waking her up from general surgery  . Coronary artery disease    a. remote RCA stenting in 2008 with non-DES. b. Cath 06/2014 following abnormal nuc: Stable, unchanged from prior cath, patent stent and 40% LM.  . Diabetes mellitus ([place])   . GERD (gastroesophageal reflux disease)   . Headache   . Hyperlipidemia   . Hypertension   . LBBB (left bundle branch block)   . Nonischemic cardiomyopathy ([place])   . OSA (obstructive sleep apnea)    AHI-9.77/hr, during REM-50.32/hr  . Tobacco abuse     Past Surgical History:  Procedure Laterality Date  . BACK SURGERY  2013  . BI-VENTRICULAR IMPLANTABLE CARDIOVERTER DEFIBRILLATOR N/A 06/11/2014   STJ CRTD implanted by Dr Caryl Comes  . CARDIAC CATHETERIZATION  12/14/2006   RCA stented with a 3.0 [employer] Liberte stent resulting in a reduction of 75% to 0% residual  . CHOLECYSTECTOMY     20 years ago  . COLONOSCOPY WITH PROPOFOL N/A  12/15/2014   Procedure: COLONOSCOPY WITH PROPOFOL;  Surgeon: Clarene Essex, MD;  Location: [facility];  Service: Endoscopy;  Laterality: N/A;  . EYE SURGERY     bilateral cataract surgery   . LEFT AND RIGHT HEART CATHETERIZATION WITH CORONARY ANGIOGRAM N/A 05/29/2014   Procedure: LEFT AND RIGHT HEART CATHETERIZATION WITH CORONARY ANGIOGRAM;  Surgeon: Lorretta Harp, MD;  Location: [facility] CATH LAB;  Service: Cardiovascular;  Laterality: N/A;  . LEFT HEART CATHETERIZATION WITH CORONARY ANGIOGRAM N/A 12/27/2012   Procedure: LEFT HEART CATHETERIZATION WITH CORONARY ANGIOGRAM;  Surgeon: Lorretta Harp, MD;  Location: [facility] CATH LAB;  Service: Cardiovascular;  Laterality: N/A;  Social History   Tobacco Use  . Smoking status: Current Every Day Smoker    Packs/day: 2.00    Years: 58.00    Pack years: 116.00    Types: Cigarettes    Start date: 12/26/1952    Last attempt to quit: 12/05/2013    Years since quitting: 4.9  . Smokeless tobacco: Never Used  . Tobacco comment: uses Vape cigarettes  Substance Use Topics  . Alcohol use: No    Alcohol/week: 0.0 standard drinks    Family History  Problem Relation Age of Onset  . Stroke Mother   . Hypertension Mother   . Coronary artery disease Father   . Stroke Brother   . Heart disease Brother   . Other Brother        H1N1 VIRUS  . Healthy Sister   . Healthy Sister     ROS Per hpi  Objective  Vitals as reported by the patient: as above   ASSESSMENT and PLAN  1. Hypotension, unspecified hypotension type Reassured patient. Resolved with decrease in lasix wo any acute worsening of CHF.  RTC precautions reviewed.   2. Numbness of legs Patient long standing DM with known DDD. Not having pain, no red flags per history. Will evaluate at next ov. RTC precautions reviewed.   FOLLOW-UP: 2 weeks, BP and leg numbness   The above assessment and management plan was discussed with the patient. The patient verbalized understanding of and has  agreed to the management plan. Patient is aware to call the clinic if symptoms persist or worsen. Patient is aware when to return to the clinic for a follow-up visit. Patient educated on when it is appropriate to go to the emergency department.    I provided 30 minutes of non-face-to-face time during this encounter.  Rutherford Guys, MD Primary [facility] at [place], [place] [address] Ph.  [phone] Fax [phone]

## 2018-10-30 ENCOUNTER — Telehealth: Payer: Self-pay | Admitting: Family Medicine

## 2018-10-30 DIAGNOSIS — H26491 Other secondary cataract, right eye: Secondary | ICD-10-CM | POA: Diagnosis not present

## 2018-10-30 NOTE — Telephone Encounter (Signed)
She would like to know what would you like her to do since her B/P is still running low? Please advise

## 2018-10-30 NOTE — Telephone Encounter (Signed)
Patient called in and stated that Dr Pamella Pert wanted her to call if her BP was still low 93/56 and HR was 70   [phone] home number if cant reach on Cell

## 2018-10-31 NOTE — Progress Notes (Signed)
SCHEDULED

## 2018-11-01 NOTE — Telephone Encounter (Signed)
I have that she is taking carvedilol 1 1/2 tablets twice a day. Can she please decrease to 1 tablet twice a day. Continue monitoring BP and HR. Please also have her reach out to her cardiologist. thanks

## 2018-11-02 ENCOUNTER — Telehealth: Payer: Self-pay

## 2018-11-02 NOTE — Telephone Encounter (Signed)
New Message   Patient is calling in still having issues with blood pressure being low. States that her PCP instructed her to only take the carvedilol 1 tab BID to assist with the low blood pressure. Patient states that she does not want to change this medication unless she is instructed to do so by Dr Gwenlyn Found or his nurse. Please give patient a call back.

## 2018-11-02 NOTE — Telephone Encounter (Signed)
Spoke with pt regarding her hypotension. She was advised by her PCP today to decrease her Carvedilol from 1.5 tab to 1 tablet daily and increase her fluid intake.   Pt is concerned because she is on a 25 ounce/day fluid restriction and does not think she should be increasing her fluid intake anymore.   She has recently switched her Lasix dose from TID to once daily.   She does not want to make any med changes specifically to the carvedilol without Dr. Kennon Holter approval.   Recent Vitals: 8/25  93/56  HR 70 8/26  97/59  HR 75 8/28  111/65  HR 84

## 2018-11-02 NOTE — Telephone Encounter (Signed)
Spoke with pt and informed her of Dr. Pamella Pert recommendations about her medication, she verbalized understanding.

## 2018-11-05 ENCOUNTER — Ambulatory Visit: Payer: Medicare HMO | Admitting: Family Medicine

## 2018-11-06 ENCOUNTER — Telehealth: Payer: Self-pay | Admitting: *Deleted

## 2018-11-06 ENCOUNTER — Ambulatory Visit (INDEPENDENT_AMBULATORY_CARE_PROVIDER_SITE_OTHER): Payer: Medicare HMO | Admitting: *Deleted

## 2018-11-06 ENCOUNTER — Encounter: Payer: Self-pay | Admitting: Family Medicine

## 2018-11-06 DIAGNOSIS — I5022 Chronic systolic (congestive) heart failure: Secondary | ICD-10-CM

## 2018-11-06 DIAGNOSIS — R55 Syncope and collapse: Secondary | ICD-10-CM | POA: Diagnosis not present

## 2018-11-06 LAB — CUP PACEART REMOTE DEVICE CHECK
Date Time Interrogation Session: 20200901180034
Implantable Lead Implant Date: 20160406
Implantable Lead Implant Date: 20160406
Implantable Lead Implant Date: 20160406
Implantable Lead Location: 753858
Implantable Lead Location: 753859
Implantable Lead Location: 753860
Implantable Lead Model: 7122
Implantable Pulse Generator Implant Date: 20160406
Pulse Gen Serial Number: 7199559

## 2018-11-06 NOTE — Telephone Encounter (Signed)
Schedule AWV.  

## 2018-11-08 ENCOUNTER — Other Ambulatory Visit: Payer: Self-pay | Admitting: Cardiovascular Disease

## 2018-11-08 DIAGNOSIS — I2510 Atherosclerotic heart disease of native coronary artery without angina pectoris: Secondary | ICD-10-CM

## 2018-11-11 DIAGNOSIS — R69 Illness, unspecified: Secondary | ICD-10-CM | POA: Diagnosis not present

## 2018-11-12 DIAGNOSIS — R69 Illness, unspecified: Secondary | ICD-10-CM | POA: Diagnosis not present

## 2018-11-13 ENCOUNTER — Telehealth: Payer: Self-pay | Admitting: Cardiovascular Disease

## 2018-11-13 NOTE — Telephone Encounter (Signed)
Returned call to pt she states that her BP is "high" she states that high for her 114/57 informed pt that this is not high. In the am she states that it is low in the mornings. When she was in the PCP office it was 92/60. She is on fluid restriction 90oz QD. She states that they told her to eat salty food and it did come up. She states that her high BP is 119/60 and low is 93/56 and states that she "does not feel good when it gets that low". I have made an appt to see Dr Gwenlyn Found tomorrow to discuss.

## 2018-11-13 NOTE — Telephone Encounter (Signed)
New message:    Patient calling stating that her HR being running high and she is having some BP issue and would like for some to call. Patient not sure if she is taking BP medication.

## 2018-11-14 ENCOUNTER — Other Ambulatory Visit: Payer: Self-pay

## 2018-11-14 ENCOUNTER — Ambulatory Visit: Payer: Medicare HMO | Admitting: Cardiovascular Disease

## 2018-11-14 ENCOUNTER — Encounter: Payer: Self-pay | Admitting: Cardiovascular Disease

## 2018-11-14 DIAGNOSIS — I10 Essential (primary) hypertension: Secondary | ICD-10-CM

## 2018-11-14 DIAGNOSIS — Z720 Tobacco use: Secondary | ICD-10-CM | POA: Diagnosis not present

## 2018-11-14 DIAGNOSIS — I519 Heart disease, unspecified: Secondary | ICD-10-CM | POA: Diagnosis not present

## 2018-11-14 DIAGNOSIS — E782 Mixed hyperlipidemia: Secondary | ICD-10-CM

## 2018-11-14 DIAGNOSIS — I2510 Atherosclerotic heart disease of native coronary artery without angina pectoris: Secondary | ICD-10-CM

## 2018-11-14 NOTE — Assessment & Plan Note (Signed)
History of CAD status post RCA stenting in the past.  Catheterization Dr. Melvern Banker 05/04/2007 after an abnormal Myoview revealed a 30 to 40% eccentric distal left main, patent proximal RCA stent with normal EF.  I re-angiogram to her 12/26/2012 revealing essentially unchanged anatomy with a patent stent in RCA.  Her last catheter from 05/29/2014 again revealed unchanged anatomy with severe LV dysfunction suggesting nonischemic cardiomyopathy.

## 2018-11-14 NOTE — Assessment & Plan Note (Signed)
History of hyperlipidemia on statin therapy with lipid profile performed 05/05/2018 revealing total cholesterol 130, LDL 68 HDL 49.

## 2018-11-14 NOTE — Progress Notes (Signed)
11/14/2018 [name]   Oct 20, 1940  [id number]  Primary Physician Rutherford Guys, MD Primary Cardiologist: Lorretta Harp MD FACP, [place], [place], [place]  HPI:  [name] is a 77 y.o.  mildly overweight married Caucasian female, mother of 62, grandmother of 2 grandchildren, whom I last saw in the office  01/30/2018.Unfortunately, there are 37 year old son committed suicide by shooting himself 2 weeks ago and they're very distraught at the current time.She has a history of CAD, status post RCA stenting in the past. She was catheterized by Dr. Janene Madeira May 04, 2007, after abnormal Myoview revealing 30% to 40% eccentric distal left main. A proximal RCA stent was patent and her EF was normal. Other problems include hypertension, hyperlipidemia, and diabetes. She has chronic left bundle branch block. She smokes 1-1/2 packs of cigarettes a day. She has had back surgery by Dr. Lissa Merlin. Myoview stress test performed March 24, 2010, revealed new anteroapical scar, and because of shortness of breath I catheterized her March 31, 2010, revealing 50% "in-stent restenosis" within the RCA stent, which was smooth, 40% distal left main with anatomy unchanged from the prior study and normal LV function. She really denies chest pain or shortness of breath. Her most recent lab work, performed by Dr. Buddy Duty in June, revealed a total cholesterol of 138, LDL of 57, HDL of 44.  I saw her one year ago. Over the last several weeks she developed new onset chest pressure occurring every other day lasting for minutes at a time associated with diaphoresis. I'm concerned that she may have progression of her left main disease or and/or her RCA in-stent restenosis. Her Myoview performed prior to her last cath in January 2012 was remarkable for anteroapical scar versus breast attenuation artifact. Based on her anatomy and her symptoms I elected to proceed with outpatient diagnostic coronary arteriography which I performed  on 12/26/12 revealing essentially unchanged anatomy. Her RCA stent was patent her left main was mild. She did have mild to moderate left ventricular dysfunction with an EF in the 45% range. 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 She gets occasional chest pain which has not changed in frequency or severity. She does complain of back pain and leg pain and is seeing Dr. Ellene Route for this. She has decreased pedal pulses on exam raising the issue of the possibility of peripheral vascular disease. Lower extremity arterial Doppler studies performed in our office 03/14/14 revealed ABIs of 1 bilaterally high-frequency signal in the left external iliac artery although her symptoms are symmetric. She saw Molli Hazard in the office 05/14/14 complaining of dyspnea on exertion and diaphoresis. A 2-D echo performed 05/20/14 as well as a Myoview stress test showed significant worsening of LV function with an EF 20%, a dilated left ventricle and moderate MR with a Myoview that showed scar in the anterior wall apex and septum markedly different than prior functional studies. Based on this result I performed cardiac catheterization on her 05/29/14 revealing unchanged coronary anatomy which was deemed as moderate and stable with severe LV dysfunction and elevated filling pressures. She subsequently underwent biventricular ICD implantation by Dr. Caryl Comes on 06/11/14 with an excellent clinical result which was almost immediate. She now denies chest pain or shortness of breath. Her recent 2-D echo performed 10/22/15 revealed an EF of 50-55%. Since I saw her a year ago she is remained clinically stable.  Dr. Caryl Comes  follows her biventricular ICD and last saw her in the office 09/17/2018.  Unfortunately, her husband [name], also a patient of mine expired at home 07/05/2018 after being married 43 years.  She still visibly grieving.  Her major medical  complaints are of asymptomatic hypotension with blood pressures occasionally in the 90s.  She is been told to liberalize her salt intake and decrease her furosemide dose.   Current Meds  Medication Sig  . ALPRAZolam (XANAX) 1 MG tablet Take 1 mg by mouth daily as needed.  Marland Kitchen aspirin EC 81 MG tablet Take 81 mg by mouth at bedtime.  . BD PEN NEEDLE MICRO U/F 32G X 6 MM MISC USE NEW NEEDLE FOR EACH INJECTION OF INSULIN, FOUR TIMES A DAY  . blood glucose meter kit and supplies KIT Dispense based on patient and insurance preference. Use three times a day as directed. Dx. E11.65, Z79.4  . carvedilol (COREG) 12.5 MG tablet TAKE 1 & 1/2 (ONE & ONE-HALF) TABLETS BY MOUTH TWICE DAILY WITH A MEAL  . clopidogrel (PLAVIX) 75 MG tablet TAKE 1 TABLET BY MOUTH ONCE DAILY  . diclofenac sodium (VOLTAREN) 1 % GEL Apply 2 g topically 4 (four) times daily.  . EUTHYROX 88 MCG tablet TAKE 1 TABLET BY MOUTH AT BEDTIME  . furosemide (LASIX) 40 MG tablet Take 40 mg by mouth 2 (two) times daily.  . insulin aspart (NOVOLOG FLEXPEN) 100 UNIT/ML FlexPen Per insulin sliding scale, max TTD, 20 units.  . Insulin Detemir (LEVEMIR) 100 UNIT/ML Pen Inject 16 Units into the skin daily at 10 pm.  . mirtazapine (REMERON) 30 MG tablet Take 0.5 tablets (15 mg total) by mouth at bedtime.  . mupirocin ointment (BACTROBAN) 2 % Apply 1 application topically 2 (two) times daily.  . nitroGLYCERIN (NITROSTAT) 0.4 MG SL tablet Place 1 tablet (0.4 mg total) under the tongue every 5 (five) minutes as needed for chest pain.  . ONE TOUCH ULTRA TEST test strip   . ONETOUCH DELICA LANCETS 10R MISC   . oxyCODONE-acetaminophen (PERCOCET/ROXICET) 5-325 MG tablet Take by mouth every 4 (four) hours as needed for severe pain.  . potassium chloride SA (K-DUR) 20 MEQ tablet Take 1 tablet (20 mEq total) by mouth 2 (two) times daily.  . simvastatin (ZOCOR) 20 MG tablet TAKE 1 TABLET BY MOUTH ONCE DAILY WITH SUPPER  . triamcinolone cream (KENALOG) 0.1 %  APPLY ONE APPLICATION TOPICALLY TWO TIMES DAILY     Allergies  Allergen Reactions  . Valium [Diazepam] Swelling    face    Social History   Socioeconomic History  . Marital status: Married    Spouse name: Not on file  . Number of children: Not on file  . Years of education: Not on file  . Highest education level: Not on file  Occupational History  . Not on file  Social Needs  . Financial resource strain: Not on file  . Food insecurity    Worry: Not on file    Inability: Not on file  . Transportation needs    Medical: Not on file    Non-medical: Not on file  Tobacco Use  . Smoking status: Current Every Day Smoker    Packs/day: 2.00    Years: 58.00    Pack years: 116.00    Types: Cigarettes    Start date: 12/26/1952    Last attempt to quit: 12/05/2013    Years since quitting: 4.9  . Smokeless tobacco: Never Used  . Tobacco comment: uses Vape  cigarettes  Substance and Sexual Activity  . Alcohol use: No    Alcohol/week: 0.0 standard drinks  . Drug use: No  . Sexual activity: Not on file  Lifestyle  . Physical activity    Days per week: Not on file    Minutes per session: Not on file  . Stress: Not on file  Relationships  . Social Herbalist on phone: Not on file    Gets together: Not on file    Attends religious service: Not on file    Active member of club or organization: Not on file    Attends meetings of clubs or organizations: Not on file    Relationship status: Not on file  . Intimate partner violence    Fear of current or ex partner: Not on file    Emotionally abused: Not on file    Physically abused: Not on file    Forced sexual activity: Not on file  Other Topics Concern  . Not on file  Social History Narrative   Patient lives in [place] with his husband and grandson.   Son completed suicide on September 17th of this year, patient found him.     Review of Systems: General: negative for chills, fever, night sweats or weight changes.   Cardiovascular: negative for chest pain, dyspnea on exertion, edema, orthopnea, palpitations, paroxysmal nocturnal dyspnea or shortness of breath Dermatological: negative for rash Respiratory: negative for cough or wheezing Urologic: negative for hematuria Abdominal: negative for nausea, vomiting, diarrhea, bright red blood per rectum, melena, or hematemesis Neurologic: negative for visual changes, syncope, or dizziness All other systems reviewed and are otherwise negative except as noted above.    Blood pressure 110/68, pulse 75, height _0  (1.676 m), weight 138 lb 6.4 oz (62.8 kg).  General appearance: alert and no distress Neck: no adenopathy, no carotid bruit, no JVD, supple, symmetrical, trachea midline and thyroid not enlarged, symmetric, no tenderness/mass/nodules Lungs: clear to auscultation bilaterally Heart: regular rate and rhythm, S1, S2 normal, no murmur, click, rub or gallop Extremities: extremities normal, atraumatic, no cyanosis or edema Pulses: 2+ and symmetric Skin: Skin color, texture, turgor normal. No rashes or lesions Neurologic: Alert and oriented X 3, normal strength and tone. Normal symmetric reflexes. Normal coordination and gait  EKG not performed today  ASSESSMENT AND PLAN:   Essential hypertension History of essential hypertension blood pressure measured today at 110/68.  Her major issue now is hypotension although she is fairly asymptomatic from this.  She is on carvedilol.  Hyperlipidemia History of hyperlipidemia on statin therapy with lipid profile performed 05/05/2018 revealing total cholesterol 130, LDL 68 HDL 49.  Tobacco abuse History of ongoing tobacco abuse 1 pack/day recalcitrant to risk factor modification.  Left ventricular dysfunction History of nonischemic cardiomyopathy status post biventricular ICD implantation by Dr. Caryl Comes with recent echo performed 10/22/2015 revealing normalization of LV function now in the 50 to 55% range.   Coronary artery disease involving native coronary artery History of CAD status post RCA stenting in the past.  Catheterization Dr. Melvern Banker 05/04/2007 after an abnormal Myoview revealed a 30 to 40% eccentric distal left main, patent proximal RCA stent with normal EF.  I re-angiogram to her 12/26/2012 revealing essentially unchanged anatomy with a patent stent in RCA.  Her last catheter from 05/29/2014 again revealed unchanged anatomy with severe LV dysfunction suggesting nonischemic cardiomyopathy.      Lorretta Harp MD FACP,FACC,FAHA, [facility] 11/14/2018 9:23 AM

## 2018-11-14 NOTE — Assessment & Plan Note (Signed)
History of essential hypertension blood pressure measured today at 110/68.  Her major issue now is hypotension although she is fairly asymptomatic from this.  She is on carvedilol.

## 2018-11-14 NOTE — Assessment & Plan Note (Signed)
History of ongoing tobacco abuse 1 pack/day recalcitrant to risk factor modification. 

## 2018-11-14 NOTE — Assessment & Plan Note (Signed)
History of nonischemic cardiomyopathy status post biventricular ICD implantation by Dr. Caryl Comes with recent echo performed 10/22/2015 revealing normalization of LV function now in the 50 to 55% range.

## 2018-11-14 NOTE — Patient Instructions (Signed)
Medication Instructions:  Your physician recommends that you continue on your current medications as directed. Please refer to the Current Medication list given to you today.  If you need a refill on your cardiac medications before your next appointment, please call your pharmacy.   Lab work: NONE If you have labs (blood work) drawn today and your tests are completely normal, you will receive your results only by: Marland Kitchen MyChart Message (if you have MyChart) OR . A paper copy in the mail If you have any lab test that is abnormal or we need to change your treatment, we will call you to review the results.  Testing/Procedures: NONE  Follow-Up: At [facility], you and your health needs are our priority.  As part of our continuing mission to provide you with exceptional heart care, we have created designated Provider Care Teams.  These Care Teams include your primary Cardiologist (physician) and Advanced Practice Providers (APPs -  Physician Assistants and Nurse Practitioners) who all work together to provide you with the care you need, when you need it. . You will need a follow up appointment in 6 months with AND APP AND IN 12 MONTHS Dr. Quay Burow.  Please call our office 2 months in advance to schedule this appointment.  You may see one of the following Advanced Practice Providers on your designated Care Team:   . Kerin Ransom, PA-C . Daleen Snook Kroeger, PA-C . Sande Rives, PA-C . Almyra Deforest, PA-C . Fabian Sharp, PA-C . Jory Sims, DNP . Rosaria Ferries, PA-C

## 2018-11-22 NOTE — Progress Notes (Signed)
Remote ICD transmission.   

## 2018-11-27 ENCOUNTER — Telehealth: Payer: Self-pay | Admitting: Family Medicine

## 2018-11-27 NOTE — Telephone Encounter (Signed)
triamcinolone cream (KENALOG) 0.1 %  Pt requesting a refill. Next appt on 10/20

## 2018-11-28 ENCOUNTER — Other Ambulatory Visit: Payer: Self-pay

## 2018-11-28 MED ORDER — TRIAMCINOLONE ACETONIDE 0.1 % EX CREA: TOPICAL_CREAM | CUTANEOUS | 0 refills | Status: DC

## 2018-11-29 ENCOUNTER — Telehealth: Payer: Self-pay | Admitting: Internal Medicine

## 2018-11-29 NOTE — Telephone Encounter (Signed)
° °  Patient would like results of last transmission.   1. Has your device fired? NO  2. Is you device beeping? NO  3. Are you experiencing draining or swelling at device site? NO  4. Are you calling to see if we received your device transmission? YES....and RESULTS  5. Have you passed out? NO    Please route to [place]

## 2018-11-30 NOTE — Telephone Encounter (Signed)
Made aware transmission from 11/10/18 showed all tests WNL and no alerts or episodes. Patient reports she sent transmission because she had CP and was having issues with hypotension. She did not call to report symptoms , but did see her PCP. Educated to call office if she is sending transmission due to symptoms. Reports no symptoms today and all questions answered.

## 2018-12-11 ENCOUNTER — Other Ambulatory Visit: Payer: Self-pay | Admitting: Family Medicine

## 2018-12-12 ENCOUNTER — Telehealth: Payer: Self-pay

## 2018-12-12 NOTE — Telephone Encounter (Signed)
Pt called in reporting BP going down to as low as 80s/50s.  Reviewed chart, confirmed that Aug 2020 OV was noted similarly with instructions to reduce Lasix.  Pt now taking 1 in am and 1 in pm.  Also reports weakness.  Denies CP, SOB, etc.  Scheduled virtual visit for tomorrow.  Provided instruction to call 911 if BP drops any lower or she experiences worsening or additional s/s such as CP, SOB, syncope or BP decreases any further.  Pt verbalizes understanding.  Pt also aware that provider may decide to have in office visit d/t nature of symptoms.

## 2018-12-13 ENCOUNTER — Encounter: Payer: Self-pay | Admitting: Family Medicine

## 2018-12-13 ENCOUNTER — Telehealth (INDEPENDENT_AMBULATORY_CARE_PROVIDER_SITE_OTHER): Payer: Medicare HMO | Admitting: Family Medicine

## 2018-12-13 ENCOUNTER — Other Ambulatory Visit: Payer: Self-pay

## 2018-12-13 DIAGNOSIS — I959 Hypotension, unspecified: Secondary | ICD-10-CM | POA: Diagnosis not present

## 2018-12-13 DIAGNOSIS — F4321 Adjustment disorder with depressed mood: Secondary | ICD-10-CM | POA: Diagnosis not present

## 2018-12-13 DIAGNOSIS — R69 Illness, unspecified: Secondary | ICD-10-CM | POA: Diagnosis not present

## 2018-12-13 MED ORDER — FUROSEMIDE 40 MG PO TABS: 20.0000 mg | ORAL_TABLET | Freq: Two times a day (BID) | ORAL | 0 refills | Status: DC

## 2018-12-13 NOTE — Progress Notes (Signed)
Pt says her bp has been very low. Took bp this afternoon, 89/54. Says she feels really bad ad fatigue. Says she is also still smoking

## 2018-12-13 NOTE — Progress Notes (Signed)
Virtual Visit Note  I connected with patient on 12/13/18 at 616pm by phone and verified that I am speaking with the correct person using two identifiers. [name] is currently located at home and patient is currently with them during visit. The provider, Rutherford Guys, MD is located in their office at time of visit.  I discussed the limitations, risks, security and privacy concerns of performing an evaluation and management service by telephone and the availability of in person appointments. I also discussed with the patient that there may be a patient responsible charge related to this service. The patient expressed understanding and agreed to proceed.   CC: low BP  HPI ? 78yo F with PMHx of DM2 on insulin, osteomyelitis, CKD 3, CAD, CHF, HTN, HLP, OSA not on cpap, depression and anxietywho presents today forlow BP/grief  Her brother died last week - in nursing home, asp pna -unable to see him, feeling very guilty Feels tired, weak Yesterday was feeling light headed if she stop too fast But not today Not eating very much, mostly dinner Denies any chest pain, edema, SOB Urinating well as she takes fluid pills Furosemide 49m twice a day Takes 1 xanax at bedtime She has not been taking mirtazapine since before her husband died She has no support system She denies SI  Lab Results  Component Value Date   CREATININE 1.13 (H) 10/24/2018   BUN 15 10/24/2018   NA 141 10/24/2018   K 4.1 10/24/2018   CL 101 10/24/2018   CO2 24 10/24/2018    BP Readings from Last 3 Encounters:  11/14/18 110/68  10/24/18 (!) 102/58  09/20/18 121/73    Allergies  Allergen Reactions  . Valium [Diazepam] Swelling    face    Prior to Admission medications   Medication Sig Start Date End Date Taking? Authorizing Provider  ALPRAZolam (Duanne Moron 1 MG tablet Take 1 mg by mouth daily as needed. 04/25/18  Yes [provider]  aspirin EC 81 MG tablet Take 81 mg by mouth at bedtime.   Yes  [provider]  BD PEN NEEDLE MICRO U/F 32G X 6 MM MISC USE NEW NEEDLE FOR EACH INJECTION OF INSULIN, FOUR TIMES A DAY 07/21/18  Yes SRutherford Guys MD  blood glucose meter kit and supplies KIT Dispense based on patient and insurance preference. Use three times a day as directed. Dx. E11.65, Z79.4 05/15/18  Yes SRutherford Guys MD  carvedilol (COREG) 12.5 MG tablet TAKE 1 & 1/2 (ONE & ONE-HALF) TABLETS BY MOUTH TWICE DAILY WITH A MEAL 06/12/18  Yes KDeboraha Sprang MD  clopidogrel (PLAVIX) 75 MG tablet TAKE 1 TABLET BY MOUTH ONCE DAILY 02/12/18  Yes BLorretta Harp MD  diclofenac sodium (VOLTAREN) 1 % GEL Apply 2 g topically 4 (four) times daily. 05/22/17  Yes SRutherford Guys MD  furosemide (LASIX) 40 MG tablet Take 40 mg by mouth 2 (two) times daily.   Yes [provider]  insulin aspart (NOVOLOG FLEXPEN) 100 UNIT/ML FlexPen Per insulin sliding scale, max TTD, 20 units. 06/26/17  Yes SRutherford Guys MD  Insulin Detemir (LEVEMIR) 100 UNIT/ML Pen Inject 16 Units into the skin daily at 10 pm. 09/04/18  Yes SRutherford Guys MD  levothyroxine (SYNTHROID) 88 MCG tablet TAKE 1 TABLET BY MOUTH AT BEDTIME 12/11/18  Yes SRutherford Guys MD  mirtazapine (REMERON) 30 MG tablet Take 0.5 tablets (15 mg total) by mouth at bedtime. 09/06/18  Yes SPamella Pert IBenay Spice  M, MD  mupirocin ointment (BACTROBAN) 2 % Apply 1 application topically 2 (two) times daily. 07/21/17  Yes [name], DPM  nitroGLYCERIN (NITROSTAT) 0.4 MG SL tablet Place 1 tablet (0.4 mg total) under the tongue every 5 (five) minutes as needed for chest pain. 05/01/13  Yes Brett Canales, PA-C  ONE TOUCH ULTRA TEST test strip  06/07/17  Yes [provider]  Jonetta Speak LANCETS 41D [place]  06/07/17  Yes [provider]  oxyCODONE-acetaminophen (PERCOCET/ROXICET) 5-325 MG tablet Take by mouth every 4 (four) hours as needed for severe pain.   Yes [provider]  potassium chloride SA (K-DUR) 20 MEQ tablet  Take 1 tablet (20 mEq total) by mouth 2 (two) times daily. 08/23/18  Yes Rutherford Guys, MD  sertraline (ZOLOFT) 25 MG tablet Take 1 tablet (25 mg total) by mouth daily. 09/06/18  Yes Rutherford Guys, MD  simvastatin (ZOCOR) 20 MG tablet TAKE 1 TABLET BY MOUTH ONCE DAILY WITH SUPPER 06/25/18  Yes Rutherford Guys, MD  triamcinolone cream (KENALOG) 0.1 % APPLY ONE APPLICATION TOPICALLY TWO TIMES DAILY 11/28/18  Yes Rutherford Guys, MD    Past Medical History:  Diagnosis Date  . Anxiety   . Arthritis   . Chronic pain    a. Prior h/o chronic pain on methadone.  . Chronic systolic CHF (congestive heart failure) (HCC)    a. mixed ischemic/non-ischemic cardiomyopathy. b. s/p CRT-D in 06/2014.  Marland Kitchen CKD (chronic kidney disease), stage III   . Complication of anesthesia    hard time waking her up from general surgery  . Coronary artery disease    a. remote RCA stenting in 2008 with non-DES. b. Cath 06/2014 following abnormal nuc: Stable, unchanged from prior cath, patent stent and 40% LM.  . Diabetes mellitus ([place])   . GERD (gastroesophageal reflux disease)   . Headache   . Hyperlipidemia   . Hypertension   . LBBB (left bundle branch block)   . Nonischemic cardiomyopathy ([place])   . OSA (obstructive sleep apnea)    AHI-9.77/hr, during REM-50.32/hr  . Tobacco abuse     Past Surgical History:  Procedure Laterality Date  . BACK SURGERY  2013  . BI-VENTRICULAR IMPLANTABLE CARDIOVERTER DEFIBRILLATOR N/A 06/11/2014   STJ CRTD implanted by Dr Caryl Comes  . CARDIAC CATHETERIZATION  12/14/2006   RCA stented with a 3.0 [employer] Liberte stent resulting in a reduction of 75% to 0% residual  . CHOLECYSTECTOMY     20 years ago  . COLONOSCOPY WITH PROPOFOL N/A 12/15/2014   Procedure: COLONOSCOPY WITH PROPOFOL;  Surgeon: Clarene Essex, MD;  Location: [facility] ENDOSCOPY;  Service: Endoscopy;  Laterality: N/A;  . EYE SURGERY     bilateral cataract surgery   . LEFT AND RIGHT HEART CATHETERIZATION WITH CORONARY  ANGIOGRAM N/A 05/29/2014   Procedure: LEFT AND RIGHT HEART CATHETERIZATION WITH CORONARY ANGIOGRAM;  Surgeon: Lorretta Harp, MD;  Location: [facility] CATH LAB;  Service: Cardiovascular;  Laterality: N/A;  . LEFT HEART CATHETERIZATION WITH CORONARY ANGIOGRAM N/A 12/27/2012   Procedure: LEFT HEART CATHETERIZATION WITH CORONARY ANGIOGRAM;  Surgeon: Lorretta Harp, MD;  Location: [facility] CATH LAB;  Service: Cardiovascular;  Laterality: N/A;    Social History   Tobacco Use  . Smoking status: Current Every Day Smoker    Packs/day: 2.00    Years: 58.00    Pack years: 116.00    Types: Cigarettes    Start date: 12/26/1951    Last attempt to quit: 12/05/2013  Years since quitting: 5.0  . Smokeless tobacco: Never Used  . Tobacco comment: uses Vape cigarettes  Substance Use Topics  . Alcohol use: No    Alcohol/week: 0.0 standard drinks    Family History  Problem Relation Age of Onset  . Stroke Mother   . Hypertension Mother   . Coronary artery disease Father   . Stroke Brother   . Heart disease Brother   . Other Brother        H1N1 VIRUS  . Healthy Sister   . Healthy Sister     ROS Per hpi  Objective  Vitals as reported by the patient: per cma note   ASSESSMENT and PLAN  1. Grief Patient with recent recurring loss, I have asked her to restart mirtazapine, referring to counseling (urgent). Denies SI. RTC precautions reviewed. - Ambulatory referral to Psychology  2. Hypotension, unspecified hypotension type Decrease lasix. Increase oral intake. Cont with home bp monitoring.   Other orders - furosemide (LASIX) 40 MG tablet; Take 0.5 tablets (20 mg total) by mouth 2 (two) times daily.  FOLLOW-UP: 2 weeks   The above assessment and management plan was discussed with the patient. The patient verbalized understanding of and has agreed to the management plan. Patient is aware to call the clinic if symptoms persist or worsen. Patient is aware when to return to the clinic for a  follow-up visit. Patient educated on when it is appropriate to go to the emergency department.    I provided 18 minutes of non-face-to-face time during this encounter.  Rutherford Guys, MD Primary Care at [address], [place] [address] Ph.  [phone] Fax [phone]

## 2018-12-14 ENCOUNTER — Telehealth: Payer: Self-pay | Admitting: Family Medicine

## 2018-12-14 NOTE — Telephone Encounter (Signed)
12/14/2018 - PATIENT HAD A VIRTUAL OFFICE VISIT WITH DR. Benay Spice ON Thursday (12/13/2018). DR. Benay Spice HAS REQUESTED SHE HAVE A FOLLOW-UP APPOINTMENT WITH HER IN 2 WEEKS. I TRIED TO CALL AND SCHEDULE BUT HAD TO LEAVE HER A VOICE MAIL TO RETURN MY CALL. [place]

## 2018-12-17 ENCOUNTER — Telehealth: Payer: Self-pay | Admitting: Family Medicine

## 2018-12-17 NOTE — Telephone Encounter (Signed)
Pt still has not heard from anyone regarding grief counseling FR please advise

## 2018-12-17 NOTE — Telephone Encounter (Signed)
Pt spoke with you regarding grief counsel, she is calling for that referral , which one is it pysch or pyschiatry

## 2018-12-17 NOTE — Telephone Encounter (Signed)
Pt still has

## 2018-12-18 ENCOUNTER — Ambulatory Visit: Payer: Medicare HMO | Admitting: Cardiovascular Disease

## 2018-12-18 ENCOUNTER — Encounter

## 2018-12-18 NOTE — Telephone Encounter (Signed)
Call to [facility], l/m and received return v/m that their ref coord is out of office.  Have now called pt to schedule appt.  Call to pt.  L/m that the referral office is trying to reach her to schedule.  Encourage to c/b to [place] or send mychart with any questions/concerns.

## 2018-12-18 NOTE — Telephone Encounter (Signed)
Please investigate.

## 2018-12-18 NOTE — Telephone Encounter (Signed)
Spoke to pt re [facility] referral.  States she did not receive vm.  Provided number to [facility].  Pt confirmed that she will call them today.

## 2018-12-19 ENCOUNTER — Telehealth: Payer: Self-pay | Admitting: Family Medicine

## 2018-12-19 NOTE — Telephone Encounter (Signed)
Please advise, would you like to see if pt could come into the office sooner?

## 2018-12-19 NOTE — Telephone Encounter (Signed)
Call to pt re weight gain and Furosemide.  States she reduced to half a pill in morning and half at night per instructions.  BP has been reading 110s-120s/60s.  In one week has gained 7.8lbs.  Did take a whole pill last night and this morning.  Is worried about CHF.  States she is unsure if her abdomen is just 'fat' or retaining fluid.    Pt confirms that she did reach [facility] but that they are only offering virtual visits and she really feels that she needs to talk to someone in person.  Advised that we could try to find another facility but it would take at least a few days.  Pt agreeable.    Referrals notified of pt's request for face to face counseling.  [facility] is offering both in-office and virtual visit. Referrals will send pt's info to [facility].

## 2018-12-19 NOTE — Telephone Encounter (Signed)
Pt has appt  10.20.2020/ last week med changes on fluid pill furosemide (LASIX) 40 MG tablet [[id number]] pt started taking 1/2 tab 2x aday. Pt has bad swelling had to take a whole tablet last night and this am. Pt has gained 7 &1/2 pounds in a week.. please reach to pt  [phone] home phone .

## 2018-12-20 NOTE — Telephone Encounter (Signed)
Please have her increase to full tab AM and keep 1/2 tab PM. If not weight loss, then she needs to go back to 1 tab BID. thanks

## 2018-12-20 NOTE — Telephone Encounter (Signed)
L/m on v/m with instructions for furosemide per provider note.  Encouraged pt to call back with any questions or concerns.

## 2018-12-24 ENCOUNTER — Ambulatory Visit (INDEPENDENT_AMBULATORY_CARE_PROVIDER_SITE_OTHER): Payer: Medicare HMO

## 2018-12-24 DIAGNOSIS — Z95810 Presence of automatic (implantable) cardiac defibrillator: Secondary | ICD-10-CM | POA: Diagnosis not present

## 2018-12-24 DIAGNOSIS — I5022 Chronic systolic (congestive) heart failure: Secondary | ICD-10-CM

## 2018-12-24 NOTE — Progress Notes (Signed)
EPIC Encounter for ICM Monitoring  Patient Name: [name] is a 78 y.o. female Date: 12/24/2018 Primary Care Physican: Rutherford Guys, MD Primary Cardiologist:Berry Electrophysiologist: Vergie Living Pacing: >99% 10/19/2020Weight:137.7lbs  Spoke with patient.  She reports PCP decreased Furosemide in the last couple of weeks to help with low BP but this has made her gain about 7 lbs in a week.  For the last 2-3 days she has been taking Lasix 40 mg twice a day to try and get rid of the fluid which is correlating with improvement of thoracic impedance.  Corvue thoracic impedance suggests possible ongoing fluid accumulation since 12/10/2018 but is improving the last few days. She is still not at baseline.  Prescribed:Furosemide 40 mg 0.5 tablet (20 mg total) twice a day since Furosemide has been decreased. Potassium 20 mEq 1 tablet daily.  Labs: 05/05/2018 Creatinine 1.36, BUN 19, Potassium 3.9, Sodium 146, GFR 38-44 A complete set of results can be found in Results Review.   Recommendations:  She has PCP appointment tomorrow regarding low BP and fluid symptoms.  Advised to call Dr Kennon Holter office to make appointment for follow up.  Follow-up plan: ICM clinic phone appointment on 01/01/2019 to recheck fluid levels.      Copy of ICM check sent to Dr. Caryl Comes and Dr Gwenlyn Found.   3 month ICM trend: 12/24/2018    1 Year ICM trend:       [name], RN 12/24/2018 5:08 PM

## 2018-12-25 ENCOUNTER — Ambulatory Visit: Payer: Medicare HMO | Admitting: Family Medicine

## 2018-12-26 ENCOUNTER — Encounter: Payer: Self-pay | Admitting: Family Medicine

## 2018-12-27 ENCOUNTER — Encounter: Payer: Self-pay | Admitting: Family Medicine

## 2018-12-27 ENCOUNTER — Telehealth (INDEPENDENT_AMBULATORY_CARE_PROVIDER_SITE_OTHER): Payer: Medicare HMO | Admitting: Family Medicine

## 2018-12-27 ENCOUNTER — Telehealth: Payer: Self-pay | Admitting: Cardiovascular Disease

## 2018-12-27 ENCOUNTER — Other Ambulatory Visit: Payer: Self-pay | Admitting: Family Medicine

## 2018-12-27 VITALS — BP 98/58 | HR 88 | Temp 98.0°F | Resp 18

## 2018-12-27 DIAGNOSIS — I10 Essential (primary) hypertension: Secondary | ICD-10-CM | POA: Diagnosis not present

## 2018-12-27 DIAGNOSIS — I5022 Chronic systolic (congestive) heart failure: Secondary | ICD-10-CM

## 2018-12-27 DIAGNOSIS — F4321 Adjustment disorder with depressed mood: Secondary | ICD-10-CM

## 2018-12-27 DIAGNOSIS — R69 Illness, unspecified: Secondary | ICD-10-CM | POA: Diagnosis not present

## 2018-12-27 MED ORDER — MIRTAZAPINE 30 MG PO TABS: 30.0000 mg | ORAL_TABLET | Freq: Every day | ORAL | 1 refills | Status: DC

## 2018-12-27 NOTE — Progress Notes (Signed)
Virtual Visit Note  I connected with patient on 12/27/18 at 552pm by phone and verified that I am speaking with the correct person using two identifiers. [name] is currently located at home and patient is currently with them during visit. The provider, Rutherford Guys, MD is located in their office at time of visit.  I discussed the limitations, risks, security and privacy concerns of performing an evaluation and management service by telephone and the availability of in person appointments. I also discussed with the patient that there may be a patient responsible charge related to this service. The patient expressed understanding and agreed to proceed.   CC: hypotension and grief  HPI  Last OV decreased lasix to 44m BID however she gained 7 lbs and had leg edema, so she went back to original dose but BP now back to being low, at times 70/50s Yesterday had mild headache, weak, slightly lightheaded She has reached out to cardiology Has appt on nov 4th Has BMP pending  started mirtazapine 176m- sleeping a bit better Also referred her to counseling - grief counseling thru hospice, over the phone She has also been seeing psych - restarted start citalopram 2060m   Allergies  Allergen Reactions   Valium [Diazepam] Swelling    face    Prior to Admission medications   Medication Sig Start Date End Date Taking? Authorizing Provider  ALPRAZolam (XADuanne Moron MG tablet Take 1 mg by mouth daily as needed. 04/25/18  Yes [provider]  aspirin EC 81 MG tablet Take 81 mg by mouth at bedtime.   Yes [provider]  BD PEN NEEDLE MICRO U/F 32G X 6 MM MISC USE NEW NEEDLE FOR EACH INJECTION OF INSULIN, FOUR TIMES A DAY 07/21/18  Yes SanRutherford GuysD  blood glucose meter kit and supplies KIT Dispense based on patient and insurance preference. Use three times a day as directed. Dx. E11.65, Z79.4 05/15/18  Yes SanRutherford GuysD  carvedilol (COREG) 12.5 MG tablet TAKE 1  & 1/2 (ONE & ONE-HALF) TABLETS BY MOUTH TWICE DAILY WITH A MEAL 06/12/18  Yes KleDeboraha SprangD  citalopram (CELEXA) 20 MG tablet Take 20 mg by mouth daily. 12/08/18  Yes [provider]  clopidogrel (PLAVIX) 75 MG tablet TAKE 1 TABLET BY MOUTH ONCE DAILY 02/12/18  Yes BerLorretta HarpD  furosemide (LASIX) 40 MG tablet Take 0.5 tablets (20 mg total) by mouth 2 (two) times daily. 12/13/18  Yes SanRutherford GuysD  insulin aspart (NOVOLOG FLEXPEN) 100 UNIT/ML FlexPen Per insulin sliding scale, max TTD, 20 units. 06/26/17  Yes SanRutherford GuysD  Insulin Detemir (LEVEMIR) 100 UNIT/ML Pen Inject 16 Units into the skin daily at 10 pm. 09/04/18  Yes SanRutherford GuysD  levothyroxine (SYNTHROID) 88 MCG tablet TAKE 1 TABLET BY MOUTH AT BEDTIME 12/11/18  Yes SanRutherford GuysD  mupirocin ointment (BACTROBAN) 2 % Apply 1 application topically 2 (two) times daily. 07/21/17  Yes WagTrula SladePM  nitroGLYCERIN (NITROSTAT) 0.4 MG SL tablet Place 1 tablet (0.4 mg total) under the tongue every 5 (five) minutes as needed for chest pain. 05/01/13  Yes HagBrett CanalesA-C  ONE TOUCH ULTRA TEST test strip  06/07/17  Yes [provider]  ONEJonetta SpeakNCETS [id number]/3/19  Yes [provider]  oxyCODONE-acetaminophen (PERCOCET/ROXICET) 5-325 MG tablet Take by mouth every 4 (four) hours as needed for severe pain.  Yes [provider]  potassium chloride SA (K-DUR) 20 MEQ tablet Take 1 tablet (20 mEq total) by mouth 2 (two) times daily. 08/23/18  Yes Rutherford Guys, MD  simvastatin (ZOCOR) 20 MG tablet TAKE 1 TABLET BY MOUTH ONCE DAILY WITH SUPPER 06/25/18  Yes Rutherford Guys, MD  triamcinolone cream (KENALOG) 0.1 % APPLY ONE APPLICATION TOPICALLY TWO TIMES DAILY 11/28/18  Yes Rutherford Guys, MD  diclofenac sodium (VOLTAREN) 1 % GEL Apply 2 g topically 4 (four) times daily. Patient not taking: Reported on 12/27/2018 05/22/17   Rutherford Guys, MD    Past Medical  History:  Diagnosis Date   Anxiety    Arthritis    Chronic pain    a. Prior h/o chronic pain on methadone.   Chronic systolic CHF (congestive heart failure) (HCC)    a. mixed ischemic/non-ischemic cardiomyopathy. b. s/p CRT-D in 06/2014.   CKD (chronic kidney disease), stage III    Complication of anesthesia    hard time waking her up from general surgery   Coronary artery disease    a. remote RCA stenting in 2008 with non-DES. b. Cath 06/2014 following abnormal nuc: Stable, unchanged from prior cath, patent stent and 40% LM.   Diabetes mellitus (HCC)    GERD (gastroesophageal reflux disease)    Headache    Hyperlipidemia    Hypertension    LBBB (left bundle branch block)    Nonischemic cardiomyopathy (HCC)    OSA (obstructive sleep apnea)    AHI-9.77/hr, during REM-50.32/hr   Tobacco abuse     Past Surgical History:  Procedure Laterality Date   BACK SURGERY  2013   BI-VENTRICULAR IMPLANTABLE CARDIOVERTER DEFIBRILLATOR N/A 06/11/2014   STJ CRTD implanted by Dr Caryl Comes   CARDIAC CATHETERIZATION  12/14/2006   RCA stented with a 3.0 [employer] Liberte stent resulting in a reduction of 75% to 0% residual   CHOLECYSTECTOMY     20 years ago   COLONOSCOPY WITH PROPOFOL N/A 12/15/2014   Procedure: COLONOSCOPY WITH PROPOFOL;  Surgeon: Clarene Essex, MD;  Location: [facility] ENDOSCOPY;  Service: Endoscopy;  Laterality: N/A;   EYE SURGERY     bilateral cataract surgery    LEFT AND RIGHT HEART CATHETERIZATION WITH CORONARY ANGIOGRAM N/A 05/29/2014   Procedure: LEFT AND RIGHT HEART CATHETERIZATION WITH CORONARY ANGIOGRAM;  Surgeon: Lorretta Harp, MD;  Location: [facility] CATH LAB;  Service: Cardiovascular;  Laterality: N/A;   LEFT HEART CATHETERIZATION WITH CORONARY ANGIOGRAM N/A 12/27/2012   Procedure: LEFT HEART CATHETERIZATION WITH CORONARY ANGIOGRAM;  Surgeon: Lorretta Harp, MD;  Location: [facility] CATH LAB;  Service: Cardiovascular;  Laterality: N/A;    Social History    Tobacco Use   Smoking status: Current Every Day Smoker    Packs/day: 2.00    Years: 58.00    Pack years: 116.00    Types: Cigarettes    Start date: 12/26/1951    Last attempt to quit: 12/05/2013    Years since quitting: 5.0   Smokeless tobacco: Never Used   Tobacco comment: uses Vape cigarettes  Substance Use Topics   Alcohol use: No    Alcohol/week: 0.0 standard drinks    Family History  Problem Relation Age of Onset   Stroke Mother    Hypertension Mother    Coronary artery disease Father    Stroke Brother    Heart disease Brother    Other Brother        H1N1 VIRUS   Healthy Sister    Healthy  Sister     ROS Per hpi  Objective  Vitals as reported by the patient: per above   ASSESSMENT and PLAN  1. Grief Uncontrolled. Increase mirtazapine. Has upcoming appt with hospice grief counselor. Discussed increasing family support. Denies SI  2. Hypotension 3. Chronic systolic CHF (congestive heart failure) (HCC) Trial of decreased lasix resulted in volume overload. Patient minimally symptomatic. Discussed backing off on BP monitoring, defer to cards for further treatment given complex cardiac history     Other orders - citalopram (CELEXA) 20 MG tablet; Take 20 mg by mouth daily. - mirtazapine (REMERON) 30 MG tablet; Take 1 tablet (30 mg total) by mouth at bedtime.  FOLLOW-UP: 1 week   The above assessment and management plan was discussed with the patient. The patient verbalized understanding of and has agreed to the management plan. Patient is aware to call the clinic if symptoms persist or worsen. Patient is aware when to return to the clinic for a follow-up visit. Patient educated on when it is appropriate to go to the emergency department.    I provided 20 minutes of non-face-to-face time during this encounter.  Rutherford Guys, MD Primary Care at [facility] [place], [place] [address] Ph.  [phone] Fax [phone]

## 2018-12-27 NOTE — Telephone Encounter (Signed)
New message   Pt c/o BP issue: STAT if pt c/o blurred vision, one-sided weakness or slurred speech  1. What are your last 5 BP readings? 83/51 98/51 77/51  107/64 102/59 103/60  2. Are you having any other symptoms (ex. Dizziness, headache, blurred vision, passed out)?headache, lightheaded   3. What is your BP issue? Patient states b/p is low

## 2018-12-27 NOTE — Telephone Encounter (Signed)
Patient checked her BP last night- it was 83/51, she took it again 77/51 HR 71, walked around some, she seen her sister and retook it with her BP cuff and it was 107/64, right arm was 102/59, then checked it later that night and it was 103/60 at 11:00 last night, she contacted her PCP office last night on call- they advised her to increase her water intake  She took it at 1:00 AM- 95/56 HR 53- and then walked throughout her house, then 2:00 AM 101/58- then finally went to bed at 2:30 AM-   When she woke up this morning 132/75 HR 91- she has a virtual visit with PCP today. She has appointment with Dr.Berry- she states she is not supposed to drink that much water because of her defibrillator. She just does not know what else to do-  They did change her lasix to 1 am and 1 at night- she would like to know if there is any changes that should be made during the day to help with her lower BP at night.   Patient has no other symptoms- she just has headaches and becomes lightheaded. I advised patient if her BP becomes low again into the 80 or 70's to call EMS and go to ER to be evaluated.  Advised I will send a message to MD and PharmD.

## 2018-12-27 NOTE — Telephone Encounter (Signed)
Called patient, advised of blood work- ordered BMP to be completed. Patient will go and have this done and we will see what that says. Patient verbalized understanding.

## 2018-12-27 NOTE — Progress Notes (Signed)
Pt states she need to speak with provider about her B/P and medications. She states her B/P has been running low and she is concerned. Pt states there has been many changes to her medication.

## 2018-12-27 NOTE — Telephone Encounter (Signed)
Would recommend patient have a BMP done to make sure she is not too "dry" causing her hypotension. She states her lasix was increased, therefore need to make sure she is not getting dehydrated from increased dose- which it sounds like she may. May need lower dose of lasix.

## 2018-12-28 ENCOUNTER — Ambulatory Visit: Payer: Medicare HMO | Admitting: Family Medicine

## 2018-12-28 ENCOUNTER — Telehealth: Payer: Self-pay | Admitting: Cardiovascular Disease

## 2018-12-28 LAB — BASIC METABOLIC PANEL
BUN/Creatinine Ratio: 13 (ref 12–28)
BUN: 17 mg/dL (ref 8–27)
CO2: 24 mmol/L (ref 20–29)
Calcium: 9.4 mg/dL (ref 8.7–10.3)
Chloride: 103 mmol/L (ref 96–106)
Creatinine, Ser: 1.29 mg/dL — ABNORMAL HIGH (ref 0.57–1.00)
GFR calc Af Amer: 46 mL/min/{1.73_m2} — ABNORMAL LOW (ref >59)
GFR calc non Af Amer: 40 mL/min/{1.73_m2} — ABNORMAL LOW (ref >59)
Glucose: 159 mg/dL — ABNORMAL HIGH (ref 65–99)
Potassium: 4.8 mmol/L (ref 3.5–5.2)
Sodium: 143 mmol/L (ref 134–144)

## 2018-12-28 NOTE — Telephone Encounter (Signed)
Spoke with pt, this started in September with her bp running low. Her medical doctor had reduced her furosemide and her bp came up but she gained weight and had to increase back to 40 mg twice daily. Her carvedilol dose is 18.75 mg twice daily. She reports feeling fine, she does have occasional dizziness with standing but has had that for sometime. Orthostatic precautions discussed with the patient. Will forward to dr berry to see if any medication changes recommended.

## 2018-12-28 NOTE — Telephone Encounter (Signed)
Pt c/o BP issue: STAT if pt c/o blurred vision, one-sided weakness or slurred speech  1. What are your last 5 BP readings?  12/28/18 12:30 am 96/55 pt walked, drank water and ate some salt chips 12/27/18 11:50 pm 92/51 12/27/18 12:30 pm 96/55 12/27/18 10:50 am 92/51  12/27/18 5:45 am 108/66   12/27/18 12:40 am 95/56 12/26/18 11: 50 pm 77/51 12/26/18 11:15 pm 107/64 walked to her Sister's house  12/26/18 10:00pm 83/51 L Arm 98/51 R Arm    2. Are you having any other symptoms (ex. Dizziness, headache, blurred vision, passed out)? A little lightheaded in the store Wednesday but it went away  3. What is your BP issue? Pt is worried her BP is low. Pt was advised by her PCP to get to see her Cardiologist   Pt notes her family members have a family hx of high bp, but her BP runs low

## 2018-12-30 NOTE — Telephone Encounter (Signed)
Sounds like Ms Moose is in an acceptable place for now without further med adjustments  JJB

## 2018-12-31 NOTE — Telephone Encounter (Signed)
Spoke with pt, aware of dr berry's recommendations. 

## 2019-01-01 ENCOUNTER — Telehealth: Payer: Self-pay

## 2019-01-01 ENCOUNTER — Ambulatory Visit (INDEPENDENT_AMBULATORY_CARE_PROVIDER_SITE_OTHER): Payer: Medicare HMO

## 2019-01-01 DIAGNOSIS — Z95810 Presence of automatic (implantable) cardiac defibrillator: Secondary | ICD-10-CM

## 2019-01-01 DIAGNOSIS — I5022 Chronic systolic (congestive) heart failure: Secondary | ICD-10-CM

## 2019-01-01 NOTE — Telephone Encounter (Signed)
Left message for patient to remind of missed remote transmission.  

## 2019-01-02 NOTE — Progress Notes (Signed)
EPIC Encounter for ICM Monitoring  Patient Name: [name] is a 78 y.o. female Date: 01/02/2019 Primary Care Physican: Rutherford Guys, MD Primary Cardiologist:Berry Electrophysiologist: Vergie Living Pacing: >99% 10/19/2020Weight:137.7lbs  Transmission reviewed.  Corvue thoracic impedance returned to baseline normal on 12/25/2018.  Prescribed:Furosemide 40 mg 0.5 tablet (20 mg total) twice a day. Potassium 20 mEq 1 tablet twice a day.  Labs: 12/28/2018 Creatinine 1.29, BUN 17, Potassium 4.8, Sodium 143, GFR 40-46 10/24/2018 Creatinine 1.13, BUN 13, Potassium 4.1, Sodium 141, GFR 47-55 08/20/2018 Creatinine 1.19, BUN 13, Potassium 3.8, Sodium 144, GFR 44-51 05/05/2018 Creatinine 1.36, BUN 19, Potassium 3.9, Sodium 146, GFR 38-44 A complete set of results can be found in Results Review.  Recommendations: No changes  Follow-up plan: ICM clinic phone appointment on 01/24/2019.   91 day device clinic remote transmission 02/05/2019.  Office appt 01/09/2019 with Dr. Kerin Ransom, PA.    Copy of ICM check sent to Dr. Caryl Comes.   3 month ICM trend: 01/01/2019    1 Year ICM trend:       [name], RN 01/02/2019 4:52 PM

## 2019-01-07 DIAGNOSIS — R69 Illness, unspecified: Secondary | ICD-10-CM | POA: Diagnosis not present

## 2019-01-09 ENCOUNTER — Ambulatory Visit: Payer: Medicare HMO | Admitting: Cardiology

## 2019-01-17 ENCOUNTER — Ambulatory Visit (HOSPITAL_COMMUNITY): Payer: Medicare HMO

## 2019-01-21 ENCOUNTER — Ambulatory Visit: Payer: Medicare HMO | Admitting: Cardiology

## 2019-01-24 ENCOUNTER — Ambulatory Visit (INDEPENDENT_AMBULATORY_CARE_PROVIDER_SITE_OTHER): Payer: Medicare HMO

## 2019-01-24 DIAGNOSIS — I5022 Chronic systolic (congestive) heart failure: Secondary | ICD-10-CM | POA: Diagnosis not present

## 2019-01-24 DIAGNOSIS — Z95810 Presence of automatic (implantable) cardiac defibrillator: Secondary | ICD-10-CM

## 2019-01-28 ENCOUNTER — Telehealth: Payer: Self-pay

## 2019-01-28 NOTE — Progress Notes (Signed)
EPIC Encounter for ICM Monitoring  Patient Name: [name] is a 78 y.o. female Date: 01/28/2019 Primary Care Physican: Rutherford Guys, MD Primary Cardiologist:Berry Electrophysiologist: Vergie Living Pacing: >99% 10/19/2020Weight:137.7lbs  Attempted call to patient and unable to reach.  Transmission reviewed.   Corvue thoracic impedance normal.  Prescribed:Furosemide 40 mg0.5tablet (20mg  total) twice a day. Potassium 20 mEq 1 tablet twice a day.  Labs: 12/28/2018 Creatinine 1.29, BUN 17, Potassium 4.8, Sodium 143, GFR 40-46 10/24/2018 Creatinine 1.13, BUN 13, Potassium 4.1, Sodium 141, GFR 47-55 08/20/2018 Creatinine 1.19, BUN 13, Potassium 3.8, Sodium 144, GFR 44-51 05/05/2018 Creatinine 1.36, BUN 19, Potassium 3.9, Sodium 146, GFR 38-44 A complete set of results can be found in Results Review.  Recommendations: Unable to reach.    Follow-up plan: ICM clinic phone appointment on 03/04/2019.   91 day device clinic remote transmission 02/05/2019.    Copy of ICM check sent to Dr. Caryl Comes.   3 month ICM trend: 01/24/2019    1 Year ICM trend:       [name], RN 01/28/2019 1:24 PM

## 2019-01-28 NOTE — Telephone Encounter (Signed)
Remote ICM transmission received.  Attempted call to patient regarding ICM remote transmission and no answer or voice mail message.   

## 2019-01-29 ENCOUNTER — Ambulatory Visit (HOSPITAL_COMMUNITY): Payer: Medicare HMO

## 2019-01-30 DIAGNOSIS — M5416 Radiculopathy, lumbar region: Secondary | ICD-10-CM | POA: Diagnosis not present

## 2019-02-05 ENCOUNTER — Ambulatory Visit (INDEPENDENT_AMBULATORY_CARE_PROVIDER_SITE_OTHER): Payer: Medicare HMO | Admitting: *Deleted

## 2019-02-05 DIAGNOSIS — I5022 Chronic systolic (congestive) heart failure: Secondary | ICD-10-CM

## 2019-02-06 ENCOUNTER — Telehealth: Payer: Self-pay | Admitting: *Deleted

## 2019-02-06 LAB — CUP PACEART REMOTE DEVICE CHECK
Battery Remaining Longevity: 35 mo
Battery Remaining Percentage: 37 %
Battery Voltage: 2.9 V
Brady Statistic AP VP Percent: 20 %
Brady Statistic AP VS Percent: 1 %
Brady Statistic AS VP Percent: 80 %
Brady Statistic AS VS Percent: 1 %
Brady Statistic RA Percent Paced: 20 %
Date Time Interrogation Session: 20201202030046
HighPow Impedance: 70 Ohm
HighPow Impedance: 70 Ohm
Implantable Lead Implant Date: 20160406
Implantable Lead Implant Date: 20160406
Implantable Lead Implant Date: 20160406
Implantable Lead Location: 753858
Implantable Lead Location: 753859
Implantable Lead Location: 753860
Implantable Lead Model: 7122
Implantable Pulse Generator Implant Date: 20160406
Lead Channel Impedance Value: 1300 Ohm
Lead Channel Impedance Value: 380 Ohm
Lead Channel Impedance Value: 610 Ohm
Lead Channel Pacing Threshold Amplitude: 0.75 V
Lead Channel Pacing Threshold Amplitude: 0.75 V
Lead Channel Pacing Threshold Amplitude: 0.875 V
Lead Channel Pacing Threshold Pulse Width: 0.5 ms
Lead Channel Pacing Threshold Pulse Width: 0.5 ms
Lead Channel Pacing Threshold Pulse Width: 0.5 ms
Lead Channel Sensing Intrinsic Amplitude: 12 mV
Lead Channel Sensing Intrinsic Amplitude: 5 mV
Lead Channel Setting Pacing Amplitude: 1.75 V
Lead Channel Setting Pacing Amplitude: 2 V
Lead Channel Setting Pacing Amplitude: 2 V
Lead Channel Setting Pacing Pulse Width: 0.5 ms
Lead Channel Setting Pacing Pulse Width: 0.5 ms
Lead Channel Setting Sensing Sensitivity: 0.5 mV
Pulse Gen Serial Number: 7199559

## 2019-02-06 NOTE — Telephone Encounter (Signed)
awv telephone

## 2019-02-15 ENCOUNTER — Other Ambulatory Visit: Payer: Self-pay | Admitting: Cardiovascular Disease

## 2019-02-18 ENCOUNTER — Telehealth: Payer: Self-pay | Admitting: Internal Medicine

## 2019-02-18 NOTE — Telephone Encounter (Signed)
Transmission from 02/18/19 reviewed. Normal device function. Presenting rhythm AS/BiVP 70s. No AT/AF or VT/VF episodes since last scheduled transmission on 02/06/19. CorVue recently dipped below reference line x5 days (approx.), suggesting fluid retention, but appears to be back at baseline as of today.   Patient is followed in [facility], message routed to Sharman Cheek, RN.

## 2019-02-18 NOTE — Telephone Encounter (Signed)
I requested that the pt send a transmission with her home remote monitor. She agreed to send one in a few minutes.

## 2019-02-18 NOTE — Telephone Encounter (Signed)
     1) How much weight have you gained and in what time span? n/a  2) If swelling, where is the swelling located? Ankles, abdomen  3) Are you currently taking a fluid pill? Yes, Patient states Lasix dosage is not listed correctly in MyChart  4) Are you currently SOB?no  5) Do you have a log of your daily weights (if so, list)?   Have you gained 3 pounds in a day or 5 pounds in a week?  6) Have you traveled recently? No

## 2019-02-18 NOTE — Telephone Encounter (Signed)
I spoke with pt. She is concerned about her fluid levels and is wondering what her device shows. States she had brief twinges of chest pain around 12/7 and sent transmission at that time. No episodes since that time.  States she has some swelling in ankles and abdominal area. Does not weigh daily. States dose listed on my chart for furosemide is not correct. She takes 80 mg furosemide every AM and 40 mg every PM. I told pt I would send message to device clinic regarding transmissions and fluid levels. I asked pt to weigh herself every morning and keep record of readings.

## 2019-02-19 ENCOUNTER — Ambulatory Visit (HOSPITAL_COMMUNITY)
Admission: RE | Admit: 2019-02-19 | Payer: Medicare HMO | Source: Ambulatory Visit | Attending: Cardiovascular Disease | Admitting: Cardiovascular Disease

## 2019-02-20 NOTE — Telephone Encounter (Signed)
Spoke with patient.  Reviewed transmission.  Advised possible fluid accumulation started 12/10 and returned to baseline 12/15.  She reports having intermittent leg swelling but it seems to be better now.  Discussed diet and advised to limit fluid intake to 64 oz daily.  She does not review food labels for salt amount and explained how to read food label and salt intake should be limited to 2000 mg daily.  Advised to avoid using salt shaker.  Will recheck fluid levels 03/04/2019.  She has occasional chest pain that scares her but MD is aware.  Advised to call 911 if chest pain changes intensity, frequency or she has other symptoms.  Advised device will not alert for heart attack symptoms and only alerts for electrical rhythm problems. She verbalized understanding.

## 2019-02-25 ENCOUNTER — Encounter (HOSPITAL_COMMUNITY): Payer: Medicare HMO

## 2019-03-04 ENCOUNTER — Other Ambulatory Visit: Payer: Self-pay

## 2019-03-04 ENCOUNTER — Telehealth (INDEPENDENT_AMBULATORY_CARE_PROVIDER_SITE_OTHER): Payer: Medicare HMO | Admitting: Family Medicine

## 2019-03-04 ENCOUNTER — Ambulatory Visit (INDEPENDENT_AMBULATORY_CARE_PROVIDER_SITE_OTHER): Payer: Medicare HMO

## 2019-03-04 DIAGNOSIS — I5022 Chronic systolic (congestive) heart failure: Secondary | ICD-10-CM

## 2019-03-04 DIAGNOSIS — R8290 Unspecified abnormal findings in urine: Secondary | ICD-10-CM

## 2019-03-04 DIAGNOSIS — Z95810 Presence of automatic (implantable) cardiac defibrillator: Secondary | ICD-10-CM

## 2019-03-04 DIAGNOSIS — Z20828 Contact with and (suspected) exposure to other viral communicable diseases: Secondary | ICD-10-CM

## 2019-03-04 DIAGNOSIS — Z20822 Contact with and (suspected) exposure to covid-19: Secondary | ICD-10-CM

## 2019-03-04 MED ORDER — CEPHALEXIN 500 MG PO CAPS: 500.0000 mg | ORAL_CAPSULE | Freq: Two times a day (BID) | ORAL | 0 refills | Status: DC

## 2019-03-04 NOTE — Progress Notes (Signed)
Virtual Visit Note  I connected with patient on 03/04/19 at 523pm by phone and verified that I am speaking with the correct person using two identifiers. [name] is currently located at home and patient is currently with them during visit. The provider, Rutherford Guys, MD is located in their office at time of visit.  I discussed the limitations, risks, security and privacy concerns of performing an evaluation and management service by telephone and the availability of in person appointments. I also discussed with the patient that there may be a patient responsible charge related to this service. The patient expressed understanding and agreed to proceed.   CC: nausea/vomiting/diarrhea/back pain  HPI ? 3 days ago had sudden onset of vomiting and diarrhea After that was fine rest of the day, has had intermittent episodes since then Having intense back pain, getting worse, cant find how to get comfortable. left side > right side, just above her left hip, does not radiate x 3 weeks No dysuria but having cloudy strong smelling urine Keeping up with her fluids since started V/D really thirsty for past 2 weeks, reports cbgs have been doing ok No h/o kidney stones She thinks she is having a UTI She has mild increase in congestion/cough, no SOB No fever, today 97.7 No known covid exposures  Allergies  Allergen Reactions  . Valium [Diazepam] Swelling    face    Prior to Admission medications   Medication Sig Start Date End Date Taking? Authorizing Provider  ALPRAZolam Duanne Moron) 1 MG tablet Take 1 mg by mouth daily as needed. 04/25/18   [provider]  aspirin EC 81 MG tablet Take 81 mg by mouth at bedtime.    [provider]  BD PEN NEEDLE MICRO U/F 32G X 6 MM MISC USE NEW NEEDLE FOR EACH INJECTION OF INSULIN, FOUR TIMES A DAY 07/21/18   Rutherford Guys, MD  blood glucose meter kit and supplies KIT Dispense based on patient and insurance preference. Use three times a  day as directed. Dx. E11.65, Z79.4 05/15/18   Rutherford Guys, MD  carvedilol (COREG) 12.5 MG tablet TAKE 1 & 1/2 (ONE & ONE-HALF) TABLETS BY MOUTH TWICE DAILY WITH A MEAL 06/12/18   Deboraha Sprang, MD  citalopram (CELEXA) 20 MG tablet Take 20 mg by mouth daily. 12/08/18   [provider]  clopidogrel (PLAVIX) 75 MG tablet Take 1 tablet by mouth once daily 02/15/19   Lorretta Harp, MD  diclofenac sodium (VOLTAREN) 1 % GEL Apply 2 g topically 4 (four) times daily. Patient not taking: Reported on 12/27/2018 05/22/17   Rutherford Guys, MD  furosemide (LASIX) 40 MG tablet Take 0.5 tablets (20 mg total) by mouth 2 (two) times daily. 12/13/18   Rutherford Guys, MD  insulin aspart (NOVOLOG FLEXPEN) 100 UNIT/ML FlexPen Per insulin sliding scale, max TTD, 20 units. 06/26/17   Rutherford Guys, MD  Insulin Detemir (LEVEMIR) 100 UNIT/ML Pen Inject 16 Units into the skin daily at 10 pm. 09/04/18   Rutherford Guys, MD  levothyroxine (SYNTHROID) 88 MCG tablet TAKE 1 TABLET BY MOUTH AT BEDTIME 12/11/18   Rutherford Guys, MD  mirtazapine (REMERON) 30 MG tablet Take 1 tablet (30 mg total) by mouth at bedtime. 12/27/18   Rutherford Guys, MD  mupirocin ointment (BACTROBAN) 2 % Apply 1 application topically 2 (two) times daily. 07/21/17   [name], DPM  nitroGLYCERIN (NITROSTAT) 0.4 MG SL tablet Place 1 tablet (  0.4 mg total) under the tongue every 5 (five) minutes as needed for chest pain. 05/01/13   Brett Canales, PA-C  ONE TOUCH ULTRA TEST test strip  06/07/17   [provider]  Jonetta Speak LANCETS [address] [place]  06/07/17   [provider]  oxyCODONE-acetaminophen (PERCOCET/ROXICET) 5-325 MG tablet Take by mouth every 4 (four) hours as needed for severe pain.    [provider]  potassium chloride SA (K-DUR) 20 MEQ tablet Take 1 tablet (20 mEq total) by mouth 2 (two) times daily. 08/23/18   Rutherford Guys, MD  simvastatin (ZOCOR) 20 MG tablet TAKE 1 TABLET BY MOUTH ONCE  DAILY WITH SUPPER 06/25/18   Rutherford Guys, MD  triamcinolone cream (KENALOG) 0.1 % APPLY ONE APPLICATION TOPICALLY TWO TIMES DAILY 11/28/18   Rutherford Guys, MD    Past Medical History:  Diagnosis Date  . Anxiety   . Arthritis   . Chronic pain    a. Prior h/o chronic pain on methadone.  . Chronic systolic CHF (congestive heart failure) (HCC)    a. mixed ischemic/non-ischemic cardiomyopathy. b. s/p CRT-D in 06/2014.  Marland Kitchen CKD (chronic kidney disease), stage III   . Complication of anesthesia    hard time waking her up from general surgery  . Coronary artery disease    a. remote RCA stenting in 2008 with non-DES. b. Cath 06/2014 following abnormal nuc: Stable, unchanged from prior cath, patent stent and 40% LM.  . Diabetes mellitus ([place])   . GERD (gastroesophageal reflux disease)   . Headache   . Hyperlipidemia   . Hypertension   . LBBB (left bundle branch block)   . Nonischemic cardiomyopathy ([place])   . OSA (obstructive sleep apnea)    AHI-9.77/hr, during REM-50.32/hr  . Tobacco abuse     Past Surgical History:  Procedure Laterality Date  . BACK SURGERY  2013  . BI-VENTRICULAR IMPLANTABLE CARDIOVERTER DEFIBRILLATOR N/A 06/11/2014   STJ CRTD implanted by Dr Caryl Comes  . CARDIAC CATHETERIZATION  12/14/2006   RCA stented with a 3.0 [employer] Liberte stent resulting in a reduction of 75% to 0% residual  . CHOLECYSTECTOMY     20 years ago  . COLONOSCOPY WITH PROPOFOL N/A 12/15/2014   Procedure: COLONOSCOPY WITH PROPOFOL;  Surgeon: Clarene Essex, MD;  Location: [facility] ENDOSCOPY;  Service: Endoscopy;  Laterality: N/A;  . EYE SURGERY     bilateral cataract surgery   . LEFT AND RIGHT HEART CATHETERIZATION WITH CORONARY ANGIOGRAM N/A 05/29/2014   Procedure: LEFT AND RIGHT HEART CATHETERIZATION WITH CORONARY ANGIOGRAM;  Surgeon: Lorretta Harp, MD;  Location: [facility] CATH LAB;  Service: Cardiovascular;  Laterality: N/A;  . LEFT HEART CATHETERIZATION WITH CORONARY ANGIOGRAM N/A 12/27/2012    Procedure: LEFT HEART CATHETERIZATION WITH CORONARY ANGIOGRAM;  Surgeon: Lorretta Harp, MD;  Location: [facility] CATH LAB;  Service: Cardiovascular;  Laterality: N/A;    Social History   Tobacco Use  . Smoking status: Current Every Day Smoker    Packs/day: 2.00    Years: 58.00    Pack years: 116.00    Types: Cigarettes    Start date: 12/26/1951    Last attempt to quit: 12/05/2013    Years since quitting: 5.2  . Smokeless tobacco: Never Used  . Tobacco comment: uses Vape cigarettes  Substance Use Topics  . Alcohol use: No    Alcohol/week: 0.0 standard drinks    Family History  Problem Relation Age of Onset  . Stroke Mother   . Hypertension  Mother   . Coronary artery disease Father   . Stroke Brother   . Heart disease Brother   . Other Brother        H1N1 VIRUS  . Healthy Sister   . Healthy Sister     ROS Per hpi  Objective  Vitals as reported by the patient: Gen: AAOx3, NAD Speaking comfortably in full sentences  ASSESSMENT and PLAN  1. Suspected COVID-19 virus infection Patient evaluated, recommend testing, reviewed quarantine guidelines. RTC precautions given. - Novel Coronavirus, NAA ([facility])  2. Cloudy urine Discussed with patient that limited eval given telemedicine visit. Given possibility of covid, decided to treat empirically  Other orders - cephALEXin (KEFLEX) 500 MG capsule; Take 1 capsule (500 mg total) by mouth 2 (two) times daily.  FOLLOW-UP: prn   The above assessment and management plan was discussed with the patient. The patient verbalized understanding of and has agreed to the management plan. Patient is aware to call the clinic if symptoms persist or worsen. Patient is aware when to return to the clinic for a follow-up visit. Patient educated on when it is appropriate to go to the emergency department.    I provided 13 minutes of non-face-to-face time during this encounter.  Rutherford Guys, MD Primary Care at [facility] [place], [place] [address] Ph.  [phone] Fax [phone]

## 2019-03-04 NOTE — Patient Instructions (Signed)
[place], [place], [place]  (entrance off [address])  M-F from 8am - 3:30pm For covid testing: schedule online at [email or web address] or by texting "COVID" to [phone]

## 2019-03-04 NOTE — Progress Notes (Signed)
This past wkn pt had vomiting and nausea with diarrhea. Says today she is feeling much better. Was able to keep food down as of last night. Having really bad back pain, feels this has nothing to do with post back surgery, pt feels this pain feels totally different. Has appt to see back specialist 03/15/2019. Thinks she may have kidney infection, having some urgency as well

## 2019-03-05 ENCOUNTER — Telehealth: Payer: Self-pay

## 2019-03-05 ENCOUNTER — Ambulatory Visit: Payer: Medicare HMO | Attending: Internal Medicine

## 2019-03-05 DIAGNOSIS — Z20822 Contact with and (suspected) exposure to covid-19: Secondary | ICD-10-CM

## 2019-03-05 DIAGNOSIS — Z20828 Contact with and (suspected) exposure to other viral communicable diseases: Secondary | ICD-10-CM | POA: Diagnosis not present

## 2019-03-05 NOTE — Progress Notes (Signed)
EPIC Encounter for ICM Monitoring  Patient Name: [name] is a 78 y.o. female Date: 03/05/2019 Primary Care Physican: Rutherford Guys, MD Primary Cardiologist:Berry Electrophysiologist: Vergie Living Pacing: >99% LastWeight:137.7lbs  Attempted call to patient and unable to reach.  Transmission reviewed.   Corvue thoracic impedanceclose to baseline normal.  Prescribed:Furosemide 40 mg0.5tablet (20mg  total) twice a day. Potassium 20 mEq 1 tablettwice a day.  Labs: 12/28/2018 Creatinine 1.29, BUN 17, Potassium 4.8, Sodium 143, GFR 40-46 10/24/2018 Creatinine 1.13, BUN 13, Potassium 4.1, Sodium 141, GFR 47-55 08/20/2018 Creatinine 1.19, BUN 13, Potassium 3.8, Sodium 144, GFR 44-51 05/05/2018 Creatinine 1.36, BUN 19, Potassium 3.9, Sodium 146, GFR 38-44 A complete set of results can be found in Results Review.  Recommendations:  Left voice mail with ICM number and encouraged to call if experiencing any fluid symptoms.  Follow-up plan: ICM clinic phone appointment on 04/08/2019.   91 day device clinic remote transmission 05/07/2019.    Copy of ICM check sent to Dr. Caryl Comes.   3 month ICM trend: 03/04/2019    1 Year ICM trend:       [name], RN 03/05/2019 10:26 AM

## 2019-03-05 NOTE — Telephone Encounter (Signed)
Remote ICM transmission received.  Attempted call to patient regarding ICM remote transmission and left detailed message per DPR.  Advised to return call for any fluid symptoms or questions. Next ICM remote transmission scheduled 04/08/2019.

## 2019-03-06 LAB — NOVEL CORONAVIRUS, NAA: SARS-CoV-2, NAA: NOT DETECTED

## 2019-03-07 ENCOUNTER — Telehealth: Payer: Self-pay | Admitting: General Practice

## 2019-03-07 ENCOUNTER — Other Ambulatory Visit: Payer: Self-pay | Admitting: Cardiovascular Disease

## 2019-03-07 NOTE — Telephone Encounter (Signed)
Negative COVID results given. Patient results "NOT Detected." Caller expressed understanding. ° °

## 2019-03-09 DIAGNOSIS — R69 Illness, unspecified: Secondary | ICD-10-CM | POA: Diagnosis not present

## 2019-03-11 ENCOUNTER — Other Ambulatory Visit: Payer: Self-pay | Admitting: *Deleted

## 2019-03-11 DIAGNOSIS — I2510 Atherosclerotic heart disease of native coronary artery without angina pectoris: Secondary | ICD-10-CM

## 2019-03-11 MED ORDER — CARVEDILOL 12.5 MG PO TABS: ORAL_TABLET | ORAL | 3 refills | Status: DC

## 2019-03-11 MED ORDER — FUROSEMIDE 40 MG PO TABS: ORAL_TABLET | ORAL | 3 refills | Status: DC

## 2019-03-13 ENCOUNTER — Other Ambulatory Visit: Payer: Self-pay | Admitting: Internal Medicine

## 2019-03-13 ENCOUNTER — Other Ambulatory Visit: Payer: Self-pay

## 2019-03-13 ENCOUNTER — Other Ambulatory Visit: Payer: Self-pay | Admitting: Family Medicine

## 2019-03-13 DIAGNOSIS — I2510 Atherosclerotic heart disease of native coronary artery without angina pectoris: Secondary | ICD-10-CM

## 2019-03-13 MED ORDER — CARVEDILOL 12.5 MG PO TABS: ORAL_TABLET | ORAL | 2 refills | Status: DC

## 2019-03-13 NOTE — Addendum Note (Signed)
Addended by: Derl Barrow on: 03/13/2019 04:21 PM   Modules accepted: Orders

## 2019-03-15 ENCOUNTER — Other Ambulatory Visit: Payer: Self-pay | Admitting: Family Medicine

## 2019-03-15 DIAGNOSIS — M5416 Radiculopathy, lumbar region: Secondary | ICD-10-CM | POA: Diagnosis not present

## 2019-03-15 NOTE — Telephone Encounter (Signed)
Forwarding medication refill request to the clinical pool for review. 

## 2019-03-19 ENCOUNTER — Telehealth: Payer: Self-pay

## 2019-03-19 NOTE — Telephone Encounter (Signed)
OK to hold antiplatelet Rx for a week prior to procedure

## 2019-03-19 NOTE — Telephone Encounter (Signed)
Hi Dr. Gwenlyn Found. Can you comment on how long Plavix can be held for upcoming lumbar ESI? She has a history of CAD s/p stenting to RCA in the past with most recent cardiac cath in 2016 showing unchanged unantomy. She also has history of non-ischemic cardiomyopathy s/p BiV ICD with improved EF of 50-55% on most recent Echo in 2017, hypertension, hyperlipidemia, and diabetes. You last saw her in 11/2018 at which time it sounds like her biggest complaint was asymptomatic mild hypotension.   Please route response back to P CV DIV PREOP.  Thank you!

## 2019-03-19 NOTE — Telephone Encounter (Signed)
Left message to call back and ask to speak with pre-op team. 

## 2019-03-19 NOTE — Telephone Encounter (Signed)
   [facility] Pre-operative Risk Assessment    Request for surgical clearance:  1. What type of surgery is being performed? LUMBAR ESI    2. When is this surgery scheduled? 03-26-2019   3. What type of clearance is required (medical clearance vs. Pharmacy clearance to hold med vs. Both)? BOTH  4. Are there any medications that need to be held prior to surgery and how long? PLAVIX   5. Practice name and name of physician performing surgery? [facility] ATTN: JESSICA  6. What is your office phone number [phone]    7.   What is your office fax number [phone]  8.   Anesthesia type (None, local, MAC, general) ? NOT LISTED

## 2019-03-20 ENCOUNTER — Telehealth: Payer: Self-pay | Admitting: *Deleted

## 2019-03-20 NOTE — Telephone Encounter (Signed)
Schedule AWV.  

## 2019-03-21 ENCOUNTER — Telehealth: Payer: Self-pay | Admitting: Family Medicine

## 2019-03-21 NOTE — Telephone Encounter (Signed)
PT RETURNED CALL TO SCHEDULE AWV. ADVISED PATIENT THAT NURSE WILL CALL HER BACK TO SCHEDULE

## 2019-03-21 NOTE — Telephone Encounter (Signed)
Left message on cell and home phone numbers to call back and ask to speak with preop team.

## 2019-03-26 ENCOUNTER — Other Ambulatory Visit: Payer: Self-pay | Admitting: Family Medicine

## 2019-03-26 NOTE — Telephone Encounter (Signed)
   Primary Cardiologist: Quay Burow, MD  Chart reviewed as part of pre-operative protocol coverage. Given past medical history and time since last visit, based on ACC/AHA guidelines, [name] would be at acceptable risk for the planned procedure without further cardiovascular testing.    It is acceptable for her to hold your anticoagulant medication for 1 week prior to her procedure.  Please resume antiplatelet medication as soon as hemostasis is achieved.  She has a RCRI class III risk, 6.6% risk of major cardiac event.  I will route this recommendation to the requesting party via Epic fax function and remove from pre-op pool.  Please call with questions.   Jossie Ng. [facility] [address] Office [phone] Fax [phone]

## 2019-03-26 NOTE — Telephone Encounter (Signed)
Requested Prescriptions  Pending Prescriptions Disp Refills  . potassium chloride SA (KLOR-CON) 20 MEQ tablet [Pharmacy Med Name: Potassium Chloride Crys ER 20 MEQ Oral Tablet Extended Release] 180 tablet 0    Sig: Take 1 tablet by mouth twice daily     Endocrinology:  Minerals - Potassium Supplementation Failed - 03/26/2019  9:52 AM      Failed - Cr in normal range and within 360 days    Creat  Date Value Ref Range Status  05/26/2015 1.39 (H) 0.60 - 0.93 mg/dL Final   Creatinine, Ser  Date Value Ref Range Status  12/27/2018 1.29 (H) 0.57 - 1.00 mg/dL Final         Passed - K in normal range and within 360 days    Potassium  Date Value Ref Range Status  12/27/2018 4.8 3.5 - 5.2 mmol/L Final         Passed - Valid encounter within last 12 months    Recent Outpatient Visits          3 weeks ago Suspected COVID-19 virus infection   [facility], Lilia Argue, MD   2 months ago Grief   [facility], Lilia Argue, MD   3 months ago Grief   [facility], Lilia Argue, MD   4 months ago Hypotension, unspecified hypotension type   [facility], Lilia Argue, MD   5 months ago Hypotension, unspecified hypotension type   [facility], Delfino Lovett, NP

## 2019-03-27 ENCOUNTER — Telehealth: Payer: Self-pay | Admitting: Cardiovascular Disease

## 2019-03-27 DIAGNOSIS — R69 Illness, unspecified: Secondary | ICD-10-CM | POA: Diagnosis not present

## 2019-03-27 NOTE — Progress Notes (Signed)
Patient called to schedule her appointment for a COVID Vaccine and was told that because she was on blood thinners that she would need a letter from her Doctor stating it is safe for her to get the Vaccine.  She has an appointment scheduled for Monday 01-25 and would like to pick up the letter to take it with her to her appointment. Please let her know when the letter is ready

## 2019-03-27 NOTE — Telephone Encounter (Signed)
Left message to call back  

## 2019-03-27 NOTE — Telephone Encounter (Signed)
Patient called to schedule her appointment for a COVID Vaccine and was told that because she was on blood thinners that she would need a letter from her Doctor stating it is safe for her to get the Vaccine.  She has an appointment scheduled for Monday 01-25 and would like to pick up the letter to take it with her to her appointment. Please let her know when the letter is ready

## 2019-03-27 NOTE — Telephone Encounter (Signed)
Called pt, informed letter is at the front desk for pick up

## 2019-03-28 ENCOUNTER — Telehealth: Payer: Self-pay | Admitting: Cardiovascular Disease

## 2019-03-28 NOTE — Telephone Encounter (Signed)
Returned call to patient, she is requesting letter be faxed to [phone] (# confirmed).     Letter faxed per request

## 2019-03-28 NOTE — Telephone Encounter (Signed)
Follow Up:      Pt was supposed to pick up her letter for COVID Vaccine. She would like for you to fax it to [phone].please.Marland Kitchen

## 2019-03-29 ENCOUNTER — Telehealth: Payer: Self-pay | Admitting: Family Medicine

## 2019-03-29 NOTE — Telephone Encounter (Signed)
Pt would like a call from Dr. Pamella Pert concerning an appointment she has scheduled for 04/01/19 at 4:45 for a covid shot. She would like to know if she should go through with this. Please advise at [phone]

## 2019-04-02 NOTE — Telephone Encounter (Signed)
Pt called back and is ready to schedule AWV    FR

## 2019-04-02 NOTE — Telephone Encounter (Signed)
Spoke to patient, she got the COVID vaccine on 04/01/19 because she wanted to know if it was safe with her chronic conditions. The second vaccine is 04/22/19.

## 2019-04-03 ENCOUNTER — Telehealth: Payer: Self-pay | Admitting: *Deleted

## 2019-04-03 NOTE — Telephone Encounter (Signed)
Schedule AWV.  

## 2019-04-08 ENCOUNTER — Ambulatory Visit (INDEPENDENT_AMBULATORY_CARE_PROVIDER_SITE_OTHER): Payer: Medicare HMO

## 2019-04-08 DIAGNOSIS — I5022 Chronic systolic (congestive) heart failure: Secondary | ICD-10-CM | POA: Diagnosis not present

## 2019-04-08 DIAGNOSIS — Z95810 Presence of automatic (implantable) cardiac defibrillator: Secondary | ICD-10-CM | POA: Diagnosis not present

## 2019-04-09 ENCOUNTER — Telehealth: Payer: Self-pay | Admitting: Family Medicine

## 2019-04-09 ENCOUNTER — Telehealth: Payer: Self-pay

## 2019-04-09 NOTE — Progress Notes (Signed)
EPIC Encounter for ICM Monitoring  Patient Name: [name] is a 79 y.o. female Date: 04/09/2019 Primary Care Physican: Rutherford Guys, MD Primary Cardiologist:Berry Electrophysiologist: Vergie Living Pacing: >99% LastWeight:137.7lbs  Attempted call to patient and unable to reach. Transmission reviewed.  Corvue thoracic impedancenormal.  Prescribed:Furosemide 40 mg0.5tablet (20mg  total) twice a day. Potassium 20 mEq 1 tablettwice a day.  Labs: 12/28/2018 Creatinine 1.29, BUN 17, Potassium 4.8, Sodium 143, GFR 40-46 10/24/2018 Creatinine 1.13, BUN 13, Potassium 4.1, Sodium 141, GFR 47-55 08/20/2018 Creatinine 1.19, BUN 13, Potassium 3.8, Sodium 144, GFR 44-51 05/05/2018 Creatinine 1.36, BUN 19, Potassium 3.9, Sodium 146, GFR 38-44 A complete set of results can be found in Results Review.  Recommendations:  Left voice mail with ICM number and encouraged to call if experiencing any fluid symptoms.  Follow-up plan: ICM clinic phone appointment on 05/14/2019.   91 day device clinic remote transmission 05/13/2019.    Copy of ICM check sent to Dr. Caryl Comes.   3 month ICM trend: 04/05/2019    [name], RN 04/09/2019 3:27 PM

## 2019-04-09 NOTE — Telephone Encounter (Signed)
Remote ICM transmission received.  Attempted call to patient regarding ICM remote transmission and left detailed message per DPR.  Advised to return call for any fluid symptoms or questions. Next ICM remote transmission scheduled 05/14/2019.

## 2019-04-11 ENCOUNTER — Ambulatory Visit (INDEPENDENT_AMBULATORY_CARE_PROVIDER_SITE_OTHER): Payer: Medicare HMO | Admitting: Family Medicine

## 2019-04-11 VITALS — BP 95/80 | Ht 65.0 in | Wt 134.0 lb

## 2019-04-11 DIAGNOSIS — Z0000 Encounter for general adult medical examination without abnormal findings: Secondary | ICD-10-CM | POA: Diagnosis not present

## 2019-04-11 NOTE — Progress Notes (Signed)
Presents today for [employer] Visit   Date of last exam: 03/04/2019  Interpreter used for this visit? No  I connected with  Avereigh M Ehrmann on 04/11/19 by a telephone  and verified that I am speaking with the correct person using two identifiers.   I discussed the limitations of evaluation and management by telemedicine. The patient expressed understanding and agreed to proceed.   Patient Care Team: Rutherford Guys, MD as PCP - General (Family Medicine) Lorretta Harp, MD as PCP - Cardiology (Cardiology) Lorretta Harp, MD as Consulting Physician (Cardiology) Chucky May, MD as Consulting Physician (Psychiatry) Kristeen Miss, MD as Consulting Physician (Neurosurgery)   Other items to address today:   Discussed Eye/Dental Discussed immunizations 2nd Vaccine scheduled February 15   Other Screening: Last screening for diabetes: 10/24/2018 Last lipid screening: 05/05/2018  ADVANCE DIRECTIVES: Discussed: yes On File: no Materials Provided: yes  Immunization status:  Immunization History  Administered Date(s) Administered  . Influenza, High Dose Seasonal PF 04/10/2015, 11/27/2017  . Influenza,inj,Quad PF,6+ Mos 12/12/2018  . Pneumococcal Conjugate-13 09/20/2018  . Pneumococcal Polysaccharide-23 10/03/2008  . Zoster Recombinat (Shingrix) 09/15/2017, 12/04/2017     Health Maintenance Due  Topic Date Due  . URINE MICROALBUMIN  05/15/2019     Functional Status Survey: Is the patient deaf or have difficulty hearing?: Yes(hearing aids both ears) Does the patient have difficulty seeing, even when wearing glasses/contacts?: No Does the patient have difficulty concentrating, remembering, or making decisions?: No Does the patient have difficulty walking or climbing stairs?: No Does the patient have difficulty dressing or bathing?: No Does the patient have difficulty doing errands alone such as visiting a doctor's office or shopping?: No   6CIT  Screen 04/11/2019  What Year? 0 points  What month? 0 points  What time? 0 points  Count back from 20 0 points  Months in reverse 0 points  Repeat phrase 0 points  Total Score 0        Clinical Support from 04/11/2019 in [facility]  AUDIT-C Score  0       Home Environment:   Lives in one story home No trouble climbing stairs No scattered rugs Yes grab Adequate lighting/no clutter  Timed Warm up  N/A     Patient Active Problem List   Diagnosis Date Noted  . Adjustment reaction with anxiety and depression 04/30/2017  . Diabetic peripheral neuropathy associated with type 2 diabetes mellitus ([place]) 04/30/2017  . Hypothyroidism 04/29/2017  . DJD (degenerative joint disease), lumbar 04/29/2017  . Obesity 02/15/2017  . History of laryngeal cancer 06/09/2016  . S/P coronary artery stent placement 05/30/2016  . Tachycardia 05/26/2015  . Syncope 05/26/2015  . ICD (implantable cardioverter-defibrillator), biventricular, in situ 05/26/2015  . Chronic kidney disease, stage III (moderate) ([place]) 04/10/2015  . Esophagitis, reflux 04/10/2015  . Vitamin D deficiency 04/10/2015  . Cough 03/10/2015  . Chronic pain   . Chronic systolic CHF (congestive heart failure) ([place])   . Coronary artery disease involving native coronary artery   . Left ventricular dysfunction   . Dyspnea on exertion   . Chest pain 05/01/2013  . Hyperlipidemia 12/26/2012  . Tobacco abuse 12/26/2012  . Block, bundle branch, left 12/26/2012  . Type 2 diabetes mellitus with stage 3 chronic kidney disease, with long-term current use of insulin ([place]) 12/26/2012  . Essential hypertension 10/03/2008  . SLEEP APNEA 10/03/2008  . Chronic obstructive pulmonary disease ([place]) 10/03/2008  Past Medical History:  Diagnosis Date  . Anxiety   . Arthritis   . Chronic pain    a. Prior h/o chronic pain on methadone.  . Chronic systolic CHF (congestive heart failure) ([facility])    a. mixed ischemic/non-ischemic  cardiomyopathy. b. s/p CRT-D in 06/2014.  Marland Kitchen CKD (chronic kidney disease), stage III   . Complication of anesthesia    hard time waking her up from general surgery  . Coronary artery disease    a. remote RCA stenting in 2008 with non-DES. b. Cath 06/2014 following abnormal nuc: Stable, unchanged from prior cath, patent stent and 40% LM.  . Diabetes mellitus ([place])   . GERD (gastroesophageal reflux disease)   . Headache   . Hyperlipidemia   . Hypertension   . LBBB (left bundle branch block)   . Nonischemic cardiomyopathy ([place])   . OSA (obstructive sleep apnea)    AHI-9.77/hr, during REM-50.32/hr  . Tobacco abuse      Past Surgical History:  Procedure Laterality Date  . BACK SURGERY  2013  . BI-VENTRICULAR IMPLANTABLE CARDIOVERTER DEFIBRILLATOR N/A 06/11/2014   STJ CRTD implanted by Dr Caryl Comes  . CARDIAC CATHETERIZATION  12/14/2006   RCA stented with a 3.0 [employer] Liberte stent resulting in a reduction of 75% to 0% residual  . CHOLECYSTECTOMY     20 years ago  . COLONOSCOPY WITH PROPOFOL N/A 12/15/2014   Procedure: COLONOSCOPY WITH PROPOFOL;  Surgeon: Clarene Essex, MD;  Location: [facility] ENDOSCOPY;  Service: Endoscopy;  Laterality: N/A;  . EYE SURGERY     bilateral cataract surgery   . LEFT AND RIGHT HEART CATHETERIZATION WITH CORONARY ANGIOGRAM N/A 05/29/2014   Procedure: LEFT AND RIGHT HEART CATHETERIZATION WITH CORONARY ANGIOGRAM;  Surgeon: Lorretta Harp, MD;  Location: [facility] CATH LAB;  Service: Cardiovascular;  Laterality: N/A;  . LEFT HEART CATHETERIZATION WITH CORONARY ANGIOGRAM N/A 12/27/2012   Procedure: LEFT HEART CATHETERIZATION WITH CORONARY ANGIOGRAM;  Surgeon: Lorretta Harp, MD;  Location: [facility] CATH LAB;  Service: Cardiovascular;  Laterality: N/A;     Family History  Problem Relation Age of Onset  . Stroke Mother   . Hypertension Mother   . Coronary artery disease Father   . Stroke Brother   . Heart disease Brother   . Other Brother        H1N1 VIRUS  . Healthy  Sister   . Healthy Sister      Social History   Socioeconomic History  . Marital status: Widowed    Spouse name: Not on file  . Number of children: Not on file  . Years of education: Not on file  . Highest education level: Not on file  Occupational History  . Not on file  Tobacco Use  . Smoking status: Current Every Day Smoker    Packs/day: 2.00    Years: 58.00    Pack years: 116.00    Types: Cigarettes    Start date: 12/26/1952    Last attempt to quit: 12/05/2013    Years since quitting: 5.3  . Smokeless tobacco: Never Used  . Tobacco comment: uses Vape cigarettes  Substance and Sexual Activity  . Alcohol use: No    Alcohol/week: 0.0 standard drinks  . Drug use: No  . Sexual activity: Not on file  Other Topics Concern  . Not on file  Social History Narrative   Patient lives in [place] with his husband and grandson.   Son completed suicide on September 17th of this year, patient found him.  Social Determinants of Health   Financial Resource Strain:   . Difficulty of Paying Living Expenses: Not on file  Food Insecurity:   . Worried About Charity fundraiser in the Last Year: Not on file  . Ran Out of Food in the Last Year: Not on file  Transportation Needs:   . Lack of Transportation (Medical): Not on file  . Lack of Transportation (Non-Medical): Not on file  Physical Activity:   . Days of Exercise per Week: Not on file  . Minutes of Exercise per Session: Not on file  Stress:   . Feeling of Stress : Not on file  Social Connections:   . Frequency of Communication with Friends and Family: Not on file  . Frequency of Social Gatherings with Friends and Family: Not on file  . Attends Religious Services: Not on file  . Active Member of Clubs or Organizations: Not on file  . Attends Archivist Meetings: Not on file  . Marital Status: Not on file  Intimate Partner Violence:   . Fear of Current or Ex-Partner: Not on file  . Emotionally Abused: Not on  file  . Physically Abused: Not on file  . Sexually Abused: Not on file     Allergies  Allergen Reactions  . Valium [Diazepam] Swelling    face     Prior to Admission medications   Medication Sig Start Date End Date Taking? Authorizing Provider  ALPRAZolam Duanne Moron) 1 MG tablet Take 1 mg by mouth daily as needed. 04/25/18  Yes [provider]  aspirin EC 81 MG tablet Take 81 mg by mouth at bedtime.   Yes [provider]  BD PEN NEEDLE MICRO U/F 32G X 6 MM MISC USE NEW NEEDLE FOR EACH INJECTION OF INSULIN, FOUR TIMES A DAY 07/21/18  Yes Rutherford Guys, MD  blood glucose meter kit and supplies KIT Dispense based on patient and insurance preference. Use three times a day as directed. Dx. E11.65, Z79.4 05/15/18  Yes Rutherford Guys, MD  carvedilol (COREG) 12.5 MG tablet TAKE 1 1/2 TABLET BY MOUTH TWICE A DAY 03/13/19  Yes Deboraha Sprang, MD  citalopram (CELEXA) 20 MG tablet Take 20 mg by mouth daily. 12/08/18  Yes [provider]  clopidogrel (PLAVIX) 75 MG tablet Take 1 tablet by mouth once daily 02/15/19  Yes Lorretta Harp, MD  furosemide (LASIX) 40 MG tablet TAKE 2 TABLETS BY MOUTH IN A.M AND TAKE 1 TABLET BY MOUTH AT P.M. 03/11/19  Yes Lorretta Harp, MD  insulin aspart (NOVOLOG FLEXPEN) 100 UNIT/ML FlexPen Per insulin sliding scale, max TTD, 20 units. 06/26/17  Yes Rutherford Guys, MD  Insulin Detemir (LEVEMIR) 100 UNIT/ML Pen Inject 16 Units into the skin daily at 10 pm. 09/04/18  Yes Rutherford Guys, MD  levothyroxine (SYNTHROID) 88 MCG tablet TAKE 1 TABLET BY MOUTH AT BEDTIME 03/13/19  Yes Rutherford Guys, MD  nitroGLYCERIN (NITROSTAT) 0.4 MG SL tablet Place 1 tablet (0.4 mg total) under the tongue every 5 (five) minutes as needed for chest pain. 05/01/13  Yes Brett Canales, PA-C  ONE TOUCH ULTRA TEST test strip  06/07/17  Yes [provider]  Jonetta Speak LANCETS 02O [place]  06/07/17  Yes [provider]  oxyCODONE-acetaminophen  (PERCOCET/ROXICET) 5-325 MG tablet Take by mouth every 4 (four) hours as needed for severe pain.   Yes [provider]  potassium chloride SA (KLOR-CON) 20 MEQ tablet Take 1 tablet by  mouth twice daily 03/26/19  Yes Rutherford Guys, MD  simvastatin (ZOCOR) 20 MG tablet TAKE 1 TABLET BY MOUTH ONCE DAILY WITH SUPPER 06/25/18  Yes Rutherford Guys, MD  triamcinolone cream (KENALOG) 0.1 % APPLY ONE APPLICATION TOPICALLY TWO TIMES DAILY 11/28/18  Yes Rutherford Guys, MD  cephALEXin (KEFLEX) 500 MG capsule Take 1 capsule (500 mg total) by mouth 2 (two) times daily. 03/04/19   Rutherford Guys, MD  diclofenac sodium (VOLTAREN) 1 % GEL Apply 2 g topically 4 (four) times daily. Patient not taking: Reported on 12/27/2018 05/22/17   Rutherford Guys, MD  mirtazapine (REMERON) 30 MG tablet Take 1 tablet (30 mg total) by mouth at bedtime. 12/27/18   Rutherford Guys, MD  mupirocin ointment (BACTROBAN) 2 % Apply 1 application topically 2 (two) times daily. 07/21/17   [name], DPM     Depression screen [facility] 2/9 04/11/2019 03/04/2019 10/29/2018 10/24/2018 09/20/2018  Decreased Interest 0 0 3 0 0  Down, Depressed, Hopeless 0 0 3 0 0  PHQ - 2 Score 0 0 6 0 0  Altered sleeping - - 0 - -  Tired, decreased energy - - 3 - -  Change in appetite - - 3 - -  Feeling bad or failure about yourself  - - 0 - -  Trouble concentrating - - 2 - -  Moving slowly or fidgety/restless - - 0 - -  Suicidal thoughts - - 0 - -  PHQ-9 Score - - 14 - -  Difficult doing work/chores - - Not difficult at all - -  Some recent data might be hidden     Fall Risk  04/11/2019 03/04/2019 12/13/2018 10/29/2018 10/24/2018  Falls in the past year? 0 0 0 1 0  Number falls in past yr: 0 0 0 1 -  Injury with Fall? 0 0 0 1 0  Risk for fall due to : - Impaired mobility - - -  Follow up Falls evaluation completed;Education provided - - Falls evaluation completed -      PHYSICAL EXAM: BP 95/80 Comment: taken from previous .    patient does not take it at home  Ht '5\' 5"'  (1.651 m)   Wt 134 lb (60.8 kg)   BMI 22.30 kg/m    Wt Readings from Last 3 Encounters:  04/11/19 134 lb (60.8 kg)  11/14/18 134 lb (60.8 kg)  10/24/18 136 lb (61.7 kg)    Medicare annual wellness visit, subsequent    Education/Counseling provided regarding diet and exercise, prevention of chronic diseases, smoking/tobacco cessation, if applicable, and reviewed "Covered Medicare Preventive Services."

## 2019-04-11 NOTE — Patient Instructions (Addendum)
Thank you for taking time to come for your Medicare Wellness Visit. I appreciate your ongoing commitment to your health goals. Please review the following plan we discussed and let me know if I can assist you in the future.  Allison Kennedy LPN  Preventive Care 72 Years and Older, Female Preventive care refers to lifestyle choices and visits with your health care provider that can promote health and wellness. This includes:  A yearly physical exam. This is also called an annual well check.  Regular dental and eye exams.  Immunizations.  Screening for certain conditions.  Healthy lifestyle choices, such as diet and exercise. What can I expect for my preventive care visit? Physical exam Your health care provider will check:  Height and weight. These may be used to calculate body mass index (BMI), which is a measurement that tells if you are at a healthy weight.  Heart rate and blood pressure.  Your skin for abnormal spots. Counseling Your health care provider may ask you questions about:  Alcohol, tobacco, and drug use.  Emotional well-being.  Home and relationship well-being.  Sexual activity.  Eating habits.  History of falls.  Memory and ability to understand (cognition).  Work and work Statistician.  Pregnancy and menstrual history. What immunizations do I need?  Influenza (flu) vaccine  This is recommended every year. Tetanus, diphtheria, and pertussis (Tdap) vaccine  You may need a Td booster every 10 years. Varicella (chickenpox) vaccine  You may need this vaccine if you have not already been vaccinated. Zoster (shingles) vaccine  You may need this after age 47. Pneumococcal conjugate (PCV13) vaccine  One dose is recommended after age 72. Pneumococcal polysaccharide (PPSV23) vaccine  One dose is recommended after age 68. Measles, mumps, and rubella (MMR) vaccine  You may need at least one dose of MMR if you were born in 1957 or later. You may also  need a second dose. Meningococcal conjugate (MenACWY) vaccine  You may need this if you have certain conditions. Hepatitis A vaccine  You may need this if you have certain conditions or if you travel or work in places where you may be exposed to hepatitis A. Hepatitis B vaccine  You may need this if you have certain conditions or if you travel or work in places where you may be exposed to hepatitis B. Haemophilus influenzae type b (Hib) vaccine  You may need this if you have certain conditions. You may receive vaccines as individual doses or as more than one vaccine together in one shot (combination vaccines). Talk with your health care provider about the risks and benefits of combination vaccines. What tests do I need? Blood tests  Lipid and cholesterol levels. These may be checked every 5 years, or more frequently depending on your overall health.  Hepatitis C test.  Hepatitis B test. Screening  Lung cancer screening. You may have this screening every year starting at age 65 if you have a 30-pack-year history of smoking and currently smoke or have quit within the past 15 years.  Colorectal cancer screening. All adults should have this screening starting at age 17 and continuing until age 49. Your health care provider may recommend screening at age 47 if you are at increased risk. You will have tests every 1-10 years, depending on your results and the type of screening test.  Diabetes screening. This is done by checking your blood sugar (glucose) after you have not eaten for a while (fasting). You may have this done every 1-3  years.  Mammogram. This may be done every 1-2 years. Talk with your health care provider about how often you should have regular mammograms.  BRCA-related cancer screening. This may be done if you have a family history of breast, ovarian, tubal, or peritoneal cancers. Other tests  Sexually transmitted disease (STD) testing.  Bone density scan. This is done  to screen for osteoporosis. You may have this done starting at age 18. Follow these instructions at home: Eating and drinking  Eat a diet that includes fresh fruits and vegetables, whole grains, lean protein, and low-fat dairy products. Limit your intake of foods with high amounts of sugar, saturated fats, and salt.  Take vitamin and mineral supplements as recommended by your health care provider.  Do not drink alcohol if your health care provider tells you not to drink.  If you drink alcohol: ? Limit how much you have to 0-1 drink a day. ? Be aware of how much alcohol is in your drink. In the [place] one drink equals one 12 oz bottle of beer (355 mL), one 5 oz glass of wine (148 mL), or one 1 oz glass of hard liquor (44 mL). Lifestyle  Take daily care of your teeth and gums.  Stay active. Exercise for at least 30 minutes on 5 or more days each week.  Do not use any products that contain nicotine or tobacco, such as cigarettes, e-cigarettes, and chewing tobacco. If you need help quitting, ask your health care provider.  If you are sexually active, practice safe sex. Use a condom or other form of protection in order to prevent STIs (sexually transmitted infections).  Talk with your health care provider about taking a low-dose aspirin or statin. What's next?  Go to your health care provider once a year for a well check visit.  Ask your health care provider how often you should have your eyes and teeth checked.  Stay up to date on all vaccines. This information is not intended to replace advice given to you by your health care provider. Make sure you discuss any questions you have with your health care provider. Document Revised: 02/15/2018 Document Reviewed: 02/15/2018 Elsevier Patient Education  2020 [employer].

## 2019-04-15 ENCOUNTER — Telehealth (INDEPENDENT_AMBULATORY_CARE_PROVIDER_SITE_OTHER): Payer: Medicare HMO | Admitting: Family Medicine

## 2019-04-15 ENCOUNTER — Other Ambulatory Visit: Payer: Self-pay

## 2019-04-15 ENCOUNTER — Telehealth: Payer: Self-pay | Admitting: Family Medicine

## 2019-04-15 DIAGNOSIS — F4323 Adjustment disorder with mixed anxiety and depressed mood: Secondary | ICD-10-CM | POA: Diagnosis not present

## 2019-04-15 DIAGNOSIS — E039 Hypothyroidism, unspecified: Secondary | ICD-10-CM

## 2019-04-15 DIAGNOSIS — E782 Mixed hyperlipidemia: Secondary | ICD-10-CM

## 2019-04-15 DIAGNOSIS — Z794 Long term (current) use of insulin: Secondary | ICD-10-CM | POA: Diagnosis not present

## 2019-04-15 DIAGNOSIS — F4321 Adjustment disorder with depressed mood: Secondary | ICD-10-CM

## 2019-04-15 DIAGNOSIS — R69 Illness, unspecified: Secondary | ICD-10-CM | POA: Diagnosis not present

## 2019-04-15 DIAGNOSIS — E1165 Type 2 diabetes mellitus with hyperglycemia: Secondary | ICD-10-CM

## 2019-04-15 MED ORDER — TRULICITY 0.75 MG/0.5ML ~~LOC~~ SOAJ: 0.7500 mg | SUBCUTANEOUS | 3 refills | Status: DC

## 2019-04-15 NOTE — Telephone Encounter (Signed)
Please advise 

## 2019-04-15 NOTE — Progress Notes (Signed)
Virtual Visit Note  I connected with patient on 04/15/19 at 606pm by phone and verified that I am speaking with the correct person using two identifiers. Rayanna M Hopkin is currently located at home and patient is currently with them during visit. The provider, Rutherford Guys, MD is located in their office at time of visit.  I discussed the limitations, risks, security and privacy concerns of performing an evaluation and management service by telephone and the availability of in person appointments. I also discussed with the patient that there may be a patient responsible charge related to this service. The patient expressed understanding and agreed to proceed.   CC: diabetes  HPI ? PMH: DM2 on insulin, osteomyelitis, CKD 3, CAD, CHF, HTN, HLP, OSA not on cpap, depression and anxiety  She is worried about her sugar and thyroid She has been forgetting to take her thyroid medication and struggling with compliance  She has not been following her diet of recent She reports cbgs in mostly 200s, at least once a day around supper time, but not checking fasting She reports rarely feels like it is low, no lower than 80s Does levemir 16 units in the evening Does novolog on if cbg > 250, will do 2 units Reports polydipisia and polyuria Denies SOB, nausea, vomiting, abd pain She would like to try trulicity  Continues to see psychiatry, Dr Toy Care Recently increased ativan to BID She reports that her grief is main reason behind issues with compliance Denies SI Reports that she has looked into several therapist as recommended by her psychiatrist and it not able to afford any  Has new hearing aides that will be coming in at end of this visit  Due for her 2nd covid vaccine later this month     Lab Results  Component Value Date   HGBA1C 5.4 10/24/2018   HGBA1C 5.9 (H) 05/05/2018   HGBA1C 6.1 (A) 11/27/2017   Lab Results  Component Value Date   LDLCALC 68 05/05/2018   CREATININE 1.29 (H)  12/27/2018    Allergies  Allergen Reactions  . Valium [Diazepam] Swelling    face    Prior to Admission medications   Medication Sig Start Date End Date Taking? Authorizing Provider  ALPRAZolam Duanne Moron) 1 MG tablet Take 1 mg by mouth daily as needed. 04/25/18  Yes [provider]  aspirin EC 81 MG tablet Take 81 mg by mouth at bedtime.   Yes [provider]  BD PEN NEEDLE MICRO U/F 32G X 6 MM MISC USE NEW NEEDLE FOR EACH INJECTION OF INSULIN, FOUR TIMES A DAY 07/21/18  Yes Rutherford Guys, MD  blood glucose meter kit and supplies KIT Dispense based on patient and insurance preference. Use three times a day as directed. Dx. E11.65, Z79.4 05/15/18  Yes Rutherford Guys, MD  carvedilol (COREG) 12.5 MG tablet TAKE 1 1/2 TABLET BY MOUTH TWICE A DAY 03/13/19  Yes Deboraha Sprang, MD  cephALEXin (KEFLEX) 500 MG capsule Take 1 capsule (500 mg total) by mouth 2 (two) times daily. 03/04/19  Yes Rutherford Guys, MD  citalopram (CELEXA) 20 MG tablet Take 20 mg by mouth daily. 12/08/18  Yes [provider]  clopidogrel (PLAVIX) 75 MG tablet Take 1 tablet by mouth once daily 02/15/19  Yes Lorretta Harp, MD  diclofenac sodium (VOLTAREN) 1 % GEL Apply 2 g topically 4 (four) times daily. 05/22/17  Yes Rutherford Guys, MD  furosemide (LASIX) 40 MG tablet TAKE 2  TABLETS BY MOUTH IN A.M AND TAKE 1 TABLET BY MOUTH AT P.M. 03/11/19  Yes Lorretta Harp, MD  insulin aspart (NOVOLOG FLEXPEN) 100 UNIT/ML FlexPen Per insulin sliding scale, max TTD, 20 units. 06/26/17  Yes Rutherford Guys, MD  Insulin Detemir (LEVEMIR) 100 UNIT/ML Pen Inject 16 Units into the skin daily at 10 pm. 09/04/18  Yes Rutherford Guys, MD  levothyroxine (SYNTHROID) 88 MCG tablet TAKE 1 TABLET BY MOUTH AT BEDTIME 03/13/19  Yes Rutherford Guys, MD  mirtazapine (REMERON) 30 MG tablet Take 1 tablet (30 mg total) by mouth at bedtime. 12/27/18  Yes Rutherford Guys, MD  nitroGLYCERIN (NITROSTAT) 0.4 MG SL tablet Place 1  tablet (0.4 mg total) under the tongue every 5 (five) minutes as needed for chest pain. 05/01/13  Yes Brett Canales, PA-C  ONE TOUCH ULTRA TEST test strip  06/07/17  Yes [provider]  Jonetta Speak LANCETS 16X [place]  06/07/17  Yes [provider]  oxyCODONE-acetaminophen (PERCOCET/ROXICET) 5-325 MG tablet Take by mouth every 4 (four) hours as needed for severe pain.   Yes [provider]  potassium chloride SA (KLOR-CON) 20 MEQ tablet Take 1 tablet by mouth twice daily 03/26/19  Yes Rutherford Guys, MD  simvastatin (ZOCOR) 20 MG tablet TAKE 1 TABLET BY MOUTH ONCE DAILY WITH SUPPER 06/25/18  Yes Rutherford Guys, MD  triamcinolone cream (KENALOG) 0.1 % APPLY ONE APPLICATION TOPICALLY TWO TIMES DAILY 11/28/18  Yes Rutherford Guys, MD    Past Medical History:  Diagnosis Date  . Anxiety   . Arthritis   . Chronic pain    a. Prior h/o chronic pain on methadone.  . Chronic systolic CHF (congestive heart failure) (HCC)    a. mixed ischemic/non-ischemic cardiomyopathy. b. s/p CRT-D in 06/2014.  Marland Kitchen CKD (chronic kidney disease), stage III   . Complication of anesthesia    hard time waking her up from general surgery  . Coronary artery disease    a. remote RCA stenting in 2008 with non-DES. b. Cath 06/2014 following abnormal nuc: Stable, unchanged from prior cath, patent stent and 40% LM.  . Diabetes mellitus ([place])   . GERD (gastroesophageal reflux disease)   . Headache   . Hyperlipidemia   . Hypertension   . LBBB (left bundle branch block)   . Nonischemic cardiomyopathy ([place])   . OSA (obstructive sleep apnea)    AHI-9.77/hr, during REM-50.32/hr  . Tobacco abuse     Past Surgical History:  Procedure Laterality Date  . BACK SURGERY  2013  . BI-VENTRICULAR IMPLANTABLE CARDIOVERTER DEFIBRILLATOR N/A 06/11/2014   STJ CRTD implanted by Dr Caryl Comes  . CARDIAC CATHETERIZATION  12/14/2006   RCA stented with a 3.0 [employer] Liberte stent resulting in a reduction of 75% to  0% residual  . CHOLECYSTECTOMY     20 years ago  . COLONOSCOPY WITH PROPOFOL N/A 12/15/2014   Procedure: COLONOSCOPY WITH PROPOFOL;  Surgeon: Clarene Essex, MD;  Location: [facility] ENDOSCOPY;  Service: Endoscopy;  Laterality: N/A;  . EYE SURGERY     bilateral cataract surgery   . LEFT AND RIGHT HEART CATHETERIZATION WITH CORONARY ANGIOGRAM N/A 05/29/2014   Procedure: LEFT AND RIGHT HEART CATHETERIZATION WITH CORONARY ANGIOGRAM;  Surgeon: Lorretta Harp, MD;  Location: [facility] CATH LAB;  Service: Cardiovascular;  Laterality: N/A;  . LEFT HEART CATHETERIZATION WITH CORONARY ANGIOGRAM N/A 12/27/2012   Procedure: LEFT HEART CATHETERIZATION WITH CORONARY ANGIOGRAM;  Surgeon: Lorretta Harp, MD;  Location: [facility]  CATH LAB;  Service: Cardiovascular;  Laterality: N/A;    Social History   Tobacco Use  . Smoking status: Current Every Day Smoker    Packs/day: 2.00    Years: 58.00    Pack years: 116.00    Types: Cigarettes    Start date: 12/26/1952    Last attempt to quit: 12/05/2013    Years since quitting: 5.3  . Smokeless tobacco: Never Used  . Tobacco comment: uses Vape cigarettes  Substance Use Topics  . Alcohol use: No    Alcohol/week: 0.0 standard drinks    Family History  Problem Relation Age of Onset  . Stroke Mother   . Hypertension Mother   . Coronary artery disease Father   . Stroke Brother   . Heart disease Brother   . Other Brother        H1N1 VIRUS  . Healthy Sister   . Healthy Sister     ROS Per hpi  Objective  Vitals as reported by the patient: none  GEN: AAOx3, NAD Resp: breathing comfortably, speaking in full sentences   ASSESSMENT and PLAN  1. Type 2 diabetes mellitus with hyperglycemia, with long-term current use of insulin (HCC) Uncontrolled. Trial of truclity is reasonable. Reviewed r/se/b. Labs pending - Hemoglobin A1c; Future - Comprehensive metabolic panel; Future  2. Hypothyroidism, unspecified type Checking labs today, medications will be adjusted as  needed. Discussed taking med at bedtime if it would improve compliance - TSH; Future  3. Adjustment reaction with anxiety and depression 4. Grief Cont to see psych, referral to [facility] requesting help navigating grief counseling, support groups given that referrals have resulted in too expensive copay  5. Mixed hyperlipidemia Checking labs today, medications will be adjusted as needed.  - Lipid panel; Future  Other orders - Dulaglutide (TRULICITY) 8.61 UO/3.7GB SOPN; Inject 0.75 mg into the skin once a week. - AMB Referral to [facility] Care Management  FOLLOW-UP: 4 weeks   The above assessment and management plan was discussed with the patient. The patient verbalized understanding of and has agreed to the management plan. Patient is aware to call the clinic if symptoms persist or worsen. Patient is aware when to return to the clinic for a follow-up visit. Patient educated on when it is appropriate to go to the emergency department.    I provided 21 minutes of non-face-to-face time during this encounter.  Rutherford Guys, MD Primary Care at [place], [place] [address] Ph.  [phone] Fax [phone]

## 2019-04-15 NOTE — Progress Notes (Signed)
She would like to talk about the medication trulicity. Says she know that she has not been eating right. Constantly craving sweets and always thirsty. Does not take tsh meds daily, just forgets. Also wants to know what does stage 3 kidney disease mean? She has seen it on her chart and just forgets to ask about it. Says she is really foggy now since her husband died in 07/10/22 of last yr. Very depressed. She was not able to completes the phq9 screening due to becoming emotional

## 2019-04-15 NOTE — Telephone Encounter (Signed)
Pt would like to know if she should take just trulicity and not her insulin. Please advise. Pt would like cb on mobile number.

## 2019-04-15 NOTE — Telephone Encounter (Signed)
Spoke with pt and scheduled appt °

## 2019-04-15 NOTE — Telephone Encounter (Signed)
Please schedule to further discuss. thanks

## 2019-04-17 ENCOUNTER — Ambulatory Visit: Payer: Medicare HMO

## 2019-04-17 ENCOUNTER — Other Ambulatory Visit: Payer: Self-pay

## 2019-04-17 DIAGNOSIS — E1165 Type 2 diabetes mellitus with hyperglycemia: Secondary | ICD-10-CM | POA: Diagnosis not present

## 2019-04-17 DIAGNOSIS — E782 Mixed hyperlipidemia: Secondary | ICD-10-CM

## 2019-04-17 DIAGNOSIS — E039 Hypothyroidism, unspecified: Secondary | ICD-10-CM

## 2019-04-17 DIAGNOSIS — Z794 Long term (current) use of insulin: Secondary | ICD-10-CM

## 2019-04-18 LAB — LIPID PANEL
Chol/HDL Ratio: 2.1 ratio (ref 0.0–4.4)
Cholesterol, Total: 146 mg/dL (ref 100–199)
HDL: 71 mg/dL (ref >39)
LDL Chol Calc (NIH): 61 mg/dL (ref 0–99)
Triglycerides: 70 mg/dL (ref 0–149)
VLDL Cholesterol Cal: 14 mg/dL (ref 5–40)

## 2019-04-18 LAB — COMPREHENSIVE METABOLIC PANEL
ALT: 17 IU/L (ref 0–32)
AST: 23 IU/L (ref 0–40)
Albumin/Globulin Ratio: 1.6 (ref 1.2–2.2)
Albumin: 4 g/dL (ref 3.7–4.7)
Alkaline Phosphatase: 85 IU/L (ref 39–117)
BUN/Creatinine Ratio: 13 (ref 12–28)
BUN: 16 mg/dL (ref 8–27)
Bilirubin Total: 0.4 mg/dL (ref 0.0–1.2)
CO2: 18 mmol/L — ABNORMAL LOW (ref 20–29)
Calcium: 9.9 mg/dL (ref 8.7–10.3)
Chloride: 102 mmol/L (ref 96–106)
Creatinine, Ser: 1.22 mg/dL — ABNORMAL HIGH (ref 0.57–1.00)
GFR calc Af Amer: 49 mL/min/{1.73_m2} — ABNORMAL LOW (ref >59)
GFR calc non Af Amer: 43 mL/min/{1.73_m2} — ABNORMAL LOW (ref >59)
Globulin, Total: 2.5 g/dL (ref 1.5–4.5)
Glucose: 129 mg/dL — ABNORMAL HIGH (ref 65–99)
Potassium: 4.3 mmol/L (ref 3.5–5.2)
Sodium: 143 mmol/L (ref 134–144)
Total Protein: 6.5 g/dL (ref 6.0–8.5)

## 2019-04-18 LAB — HEMOGLOBIN A1C
Est. average glucose Bld gHb Est-mCnc: 114 mg/dL
Hgb A1c MFr Bld: 5.6 % (ref 4.8–5.6)

## 2019-04-18 LAB — TSH: TSH: 1.2 u[IU]/mL (ref 0.450–4.500)

## 2019-04-24 ENCOUNTER — Other Ambulatory Visit: Payer: Self-pay | Admitting: *Deleted

## 2019-04-24 ENCOUNTER — Ambulatory Visit: Payer: Medicare HMO | Admitting: Emergency Medicine

## 2019-04-24 NOTE — Patient Outreach (Signed)
[facility] Care Management  04/24/2019  [name] Dec 01, 1940 [id number]    Telephone Assessment  RN spoke with pt today concerning the recent referral and inquired further on her needs. RN explained [facility] services.  Pt reports her lost (son and spouse) and has agreed to receive counseling from [facility] Education officer, museum. RN inquired further on the need for a psychiatrist however pt just needs counseling at this time. Pt states "my heart is aching". Offered moral support and will make a referral to [facility] social worker for counseling. RN further inquired on any other needs related to her health or community resources (pt decline). RN offered to follow up monthly, quarterly or whenever she needs concerning any other needs. Pt declined inidicaitng just counseling at this time however appreciative. RN offered to send information [facility] packet and informed pt to contact this RN case manager with any other needs or concerns when needed.   PLAN: Will make a referral for [facility] social work to engage and close this discipline with no other needs presented at this time.  Raina Mina, RN Care Management Coordinator [place] Office [phone]

## 2019-04-29 ENCOUNTER — Ambulatory Visit: Payer: Medicare HMO | Admitting: Emergency Medicine

## 2019-04-30 ENCOUNTER — Other Ambulatory Visit: Payer: Self-pay | Admitting: *Deleted

## 2019-04-30 ENCOUNTER — Other Ambulatory Visit: Payer: Self-pay | Admitting: Family Medicine

## 2019-04-30 MED ORDER — BD PEN NEEDLE MICRO U/F 32G X 6 MM MISC: 5 refills | Status: DC

## 2019-04-30 NOTE — Telephone Encounter (Signed)
Attempted to send refill to pharmacy electronically x 2, but failed transmission received with a note that prescription was printed. Please resend requested prescription

## 2019-05-01 NOTE — Telephone Encounter (Signed)
Looks like rx was resent as e-scribe

## 2019-05-06 ENCOUNTER — Telehealth (INDEPENDENT_AMBULATORY_CARE_PROVIDER_SITE_OTHER): Payer: Medicare HMO | Admitting: Cardiology

## 2019-05-06 VITALS — BP 105/63 | HR 65 | Ht 65.0 in | Wt 143.0 lb

## 2019-05-06 DIAGNOSIS — I951 Orthostatic hypotension: Secondary | ICD-10-CM | POA: Diagnosis not present

## 2019-05-06 DIAGNOSIS — E1140 Type 2 diabetes mellitus with diabetic neuropathy, unspecified: Secondary | ICD-10-CM

## 2019-05-06 DIAGNOSIS — I428 Other cardiomyopathies: Secondary | ICD-10-CM | POA: Insufficient documentation

## 2019-05-06 DIAGNOSIS — I2510 Atherosclerotic heart disease of native coronary artery without angina pectoris: Secondary | ICD-10-CM | POA: Diagnosis not present

## 2019-05-06 DIAGNOSIS — I447 Left bundle-branch block, unspecified: Secondary | ICD-10-CM

## 2019-05-06 DIAGNOSIS — Z9861 Coronary angioplasty status: Secondary | ICD-10-CM

## 2019-05-06 DIAGNOSIS — R0989 Other specified symptoms and signs involving the circulatory and respiratory systems: Secondary | ICD-10-CM

## 2019-05-06 DIAGNOSIS — Z95810 Presence of automatic (implantable) cardiac defibrillator: Secondary | ICD-10-CM | POA: Diagnosis not present

## 2019-05-06 DIAGNOSIS — E1122 Type 2 diabetes mellitus with diabetic chronic kidney disease: Secondary | ICD-10-CM

## 2019-05-06 DIAGNOSIS — N184 Chronic kidney disease, stage 4 (severe): Secondary | ICD-10-CM

## 2019-05-06 DIAGNOSIS — F17210 Nicotine dependence, cigarettes, uncomplicated: Secondary | ICD-10-CM

## 2019-05-06 DIAGNOSIS — R69 Illness, unspecified: Secondary | ICD-10-CM | POA: Diagnosis not present

## 2019-05-06 MED ORDER — POTASSIUM CHLORIDE CRYS ER 20 MEQ PO TBCR: 20.0000 meq | EXTENDED_RELEASE_TABLET | Freq: Two times a day (BID) | ORAL | 0 refills | Status: DC

## 2019-05-06 MED ORDER — NITROGLYCERIN 0.4 MG SL SUBL: 0.4000 mg | SUBLINGUAL_TABLET | SUBLINGUAL | 3 refills | Status: DC | PRN

## 2019-05-06 MED ORDER — FUROSEMIDE 40 MG PO TABS: ORAL_TABLET | ORAL | 3 refills | Status: DC

## 2019-05-06 NOTE — Progress Notes (Signed)
Virtual Visit via Telephone Note   This visit type was conducted due to national recommendations for restrictions regarding the COVID-19 Pandemic (e.g. social distancing) in an effort to limit this patient's exposure and mitigate transmission in our community.  Due to her co-morbid illnesses, this patient is at least at moderate risk for complications without adequate follow up.  This format is felt to be most appropriate for this patient at this time.  The patient did not have access to video technology/had technical difficulties with video requiring transitioning to audio format only (telephone).  All issues noted in this document were discussed and addressed.  No physical exam could be performed with this format.  Please refer to the patient's chart for her  consent to telehealth for [facility].   Date:  05/06/2019   ID:  Geoffery Spruce Philson, DOB 1940-12-04, MRN [id number]  Patient Location: Home Provider Location: Home  PCP:  Rutherford Guys, MD  Cardiologist:  Quay Burow, MD  Electrophysiologist:  None   Evaluation Performed:  Follow-Up Visit  Chief Complaint:  none  History of Present Illness:    Shona M Renzulli is a pleasant 79 y.o. female with a history of coronary disease, she had an RCA BMS placed in 2008 by Dr. Melvern Banker.  Subsequent catheterizations done in 2012, 2014, in 2016 have all shown a 40% left main, 40% in-stent restenosis in the RCA, and no significant LAD or circumflex disease.  In 2016 she was noted to have a new cardiomyopathy with an EF of 20% at cath.  She underwent BiV ICD implant by Dr. Caryl Comes in April 2016.  Follow-up echocardiogram in August 2017 showed an ejection fraction of 50 to 55%.  Other medical issues include HTN (although now she says she is mildly symptomatic with orthostatic hypotension), treated dyslipidemia, IDDM, CRI-3, smoking, and anxiety.  She has had some emotional trauma over the last couple years, she lost her husband after 82 years and apparently  there are 26 year old son committed suicide last year.  Patient was contacted today for routine follow-up.  Since Dr. Gwenlyn Found saw her in July 2020 she is done well from a cardiac standpoint.  She denies any chest pain.  Unfortunately she is now smoking a pack and a half a day.  She says it helps her nerves.  She did complain of low blood pressures.  She frequently has systolic readings in the 40J.  She also says she gets dizzy when she stands up from time to time but she has not had syncope.  She does restrict her fluid intake.  The patient does not have symptoms concerning for COVID-19 infection (fever, chills, cough, or new shortness of breath).    Past Medical History:  Diagnosis Date  . Anxiety   . Arthritis   . Chronic pain    a. Prior h/o chronic pain on methadone.  . Chronic systolic CHF (congestive heart failure) (HCC)    a. mixed ischemic/non-ischemic cardiomyopathy. b. s/p CRT-D in 06/2014.  Marland Kitchen CKD (chronic kidney disease), stage III   . Complication of anesthesia    hard time waking her up from general surgery  . Coronary artery disease    a. remote RCA stenting in 2008 with non-DES. b. Cath 06/2014 following abnormal nuc: Stable, unchanged from prior cath, patent stent and 40% LM.  . Diabetes mellitus ([place])   . GERD (gastroesophageal reflux disease)   . Headache   . Hyperlipidemia   . Hypertension   . LBBB (left bundle  branch block)   . Nonischemic cardiomyopathy ([place])   . OSA (obstructive sleep apnea)    AHI-9.77/hr, during REM-50.32/hr  . Tobacco abuse    Past Surgical History:  Procedure Laterality Date  . BACK SURGERY  2013  . BI-VENTRICULAR IMPLANTABLE CARDIOVERTER DEFIBRILLATOR N/A 06/11/2014   STJ CRTD implanted by Dr Caryl Comes  . CARDIAC CATHETERIZATION  12/14/2006   RCA stented with a 3.0 [employer] Liberte stent resulting in a reduction of 75% to 0% residual  . CHOLECYSTECTOMY     20 years ago  . COLONOSCOPY WITH PROPOFOL N/A 12/15/2014   Procedure:  COLONOSCOPY WITH PROPOFOL;  Surgeon: Clarene Essex, MD;  Location: [facility] ENDOSCOPY;  Service: Endoscopy;  Laterality: N/A;  . EYE SURGERY     bilateral cataract surgery   . LEFT AND RIGHT HEART CATHETERIZATION WITH CORONARY ANGIOGRAM N/A 05/29/2014   Procedure: LEFT AND RIGHT HEART CATHETERIZATION WITH CORONARY ANGIOGRAM;  Surgeon: Lorretta Harp, MD;  Location[facility] CATH LAB;  Service: Cardiovascular;  Laterality: N/A;  . LEFT HEART CATHETERIZATION WITH CORONARY ANGIOGRAM N/A 12/27/2012   Procedure: LEFT HEART CATHETERIZATION WITH CORONARY ANGIOGRAM;  Surgeon: Lorretta Harp, MD;  Location: [facility] CATH LAB;  Service: Cardiovascular;  Laterality: N/A;     Current Meds  Medication Sig  . ALPRAZolam (XANAX) 1 MG tablet Take 1 mg by mouth daily as needed.  Marland Kitchen aspirin EC 81 MG tablet Take 81 mg by mouth at bedtime.  . blood glucose meter kit and supplies KIT Dispense based on patient and insurance preference. Use three times a day as directed. Dx. E11.65, Z79.4  . carvedilol (COREG) 12.5 MG tablet TAKE 1 1/2 TABLET BY MOUTH TWICE A DAY  . citalopram (CELEXA) 20 MG tablet Take 20 mg by mouth daily.  . clopidogrel (PLAVIX) 75 MG tablet Take 1 tablet by mouth once daily  . furosemide (LASIX) 40 MG tablet TAKE 2 TABLETS BY MOUTH IN A.M AND TAKE 1 TABLET BY MOUTH AT P.M.  . insulin aspart (NOVOLOG FLEXPEN) 100 UNIT/ML FlexPen Per insulin sliding scale, max TTD, 20 units.  . Insulin Detemir (LEVEMIR) 100 UNIT/ML Pen Inject 16 Units into the skin daily at 10 pm.  . Insulin Pen Needle (BD PEN NEEDLE MICRO U/F) 32G X 6 MM MISC USE NEW NEEDLE FOR EACH INJECTION OF INSULIN, FOUR TIMES DAILY  . levothyroxine (SYNTHROID) 88 MCG tablet TAKE 1 TABLET BY MOUTH AT BEDTIME  . mirtazapine (REMERON) 30 MG tablet Take 1 tablet (30 mg total) by mouth at bedtime.  Edyth Gunnels Oleifera (MORINGA PO) Take 5,000 mg by mouth.  . nitroGLYCERIN (NITROSTAT) 0.4 MG SL tablet Place 1 tablet (0.4 mg total) under the tongue every 5 (five)  minutes as needed for chest pain.  . ONE TOUCH ULTRA TEST test strip   . ONETOUCH DELICA LANCETS 17E MISC   . oxyCODONE-acetaminophen (PERCOCET/ROXICET) 5-325 MG tablet Take by mouth every 4 (four) hours as needed for severe pain.  . potassium chloride SA (KLOR-CON) 20 MEQ tablet Take 1 tablet by mouth twice daily  . simvastatin (ZOCOR) 20 MG tablet TAKE 1 TABLET BY MOUTH ONCE DAILY WITH SUPPER  . triamcinolone cream (KENALOG) 0.1 % APPLY ONE APPLICATION TOPICALLY TWO TIMES DAILY     Allergies:   Valium [diazepam]   Social History   Tobacco Use  . Smoking status: Current Every Day Smoker    Packs/day: 2.00    Years: 58.00    Pack years: 116.00    Types: Cigarettes  Start date: 12/26/1952    Last attempt to quit: 12/05/2013    Years since quitting: 5.4  . Smokeless tobacco: Never Used  . Tobacco comment: uses Vape cigarettes  Substance Use Topics  . Alcohol use: No    Alcohol/week: 0.0 standard drinks  . Drug use: No     Family Hx: The patient's family history includes Coronary artery disease in her father; Healthy in her sister and sister; Heart disease in her brother; Hypertension in her mother; Other in her brother; Stroke in her brother and mother.  ROS:   Please see the history of present illness.    All other systems reviewed and are negative.   Prior CV studies:   The following studies were reviewed today: Echo Aug 2017  Labs/Other Tests and Data Reviewed:    EKG:  An ECG dated 09/17/2018 was personally reviewed today and demonstrated:  V-paced  Recent Labs: 04/17/2019: ALT 17; BUN 16; Creatinine, Ser 1.22; Potassium 4.3; Sodium 143; TSH 1.200   Recent Lipid Panel Lab Results  Component Value Date/Time   CHOL 146 04/17/2019 03:01 PM   TRIG 70 04/17/2019 03:01 PM   HDL 71 04/17/2019 03:01 PM   CHOLHDL 2.1 04/17/2019 03:01 PM   CHOLHDL 2.9 08/21/2009 01:00 AM   LDLCALC 61 04/17/2019 03:01 PM    Wt Readings from Last 3 Encounters:  05/06/19 143 lb (64.9  kg)  04/11/19 134 lb (60.8 kg)  11/14/18 134 lb (60.8 kg)     Objective:    Vital Signs:  BP 105/63   Pulse 65   Ht '5\' 5"'  (1.651 m)   Wt 143 lb (64.9 kg)   BMI 23.80 kg/m    VITAL SIGNS:  reviewed  ASSESSMENT & PLAN:    CAD- A. remote RCA stenting in 2008 with non-DES.  B. cath [phone]- stable disease-40% ISR RCA, 40% LM, no significant LAD or CFX disease.  No recent angina.  NICM- EF 20% at cath March 2016- EF improved to 50-55% by echo Aug 2017 after BiV ICD inplant April 2016   IDDM- With neuropathy and CRI  Orthostatic hypotension- Mildly symptomatic. I suggested she cut her Lasix and K+ back to MWF  CRI-4 GFR in the 40's- stable  Smoking- unfortunately smoking 1.5 PPD.  She does not feel she can quit- I discussed strategies to start cutting back.   Plan: Decrease Lasix and potassium to Monday Wednesday and Friday.  Continue her current regimen of fluid restriction.  Continue to work on cutting back on her smoking.  She had mentioned that carotid Dopplers had been ordered in the past but she never had these done, I will go ahead and reorder them.  She can follow-up with Dr. Alvester Chou in a year or sooner if needed.     COVID-19 Education: The signs and symptoms of COVID-19 were discussed with the patient and how to seek care for testing (follow up with PCP or arrange E-visit).  The importance of social distancing was discussed today.  Time:   Today, I have spent 22 minutes with the patient with telehealth technology discussing the above problems.     Medication Adjustments/Labs and Tests Ordered: Current medicines are reviewed at length with the patient today.  Concerns regarding medicines are outlined above.   Tests Ordered: No orders of the defined types were placed in this encounter.   Medication Changes: No orders of the defined types were placed in this encounter.   Follow Up:  In Person Dr Gwenlyn Found in  one year  Signed, Susy Manor    05/06/2019 10:32 AM    [place]

## 2019-05-06 NOTE — Patient Instructions (Signed)
Medication Instructions:  CHANGE HOW YOU TAKE YOUR LASIX AND POTASSIUM:  TAKE 2 TABLETS OF LASIX IN THE MORNING AND 1 TABLET IN THE EVENING ON Monday, Wednesday AND Friday  TAKE POTASSIUM ON Monday, Wednesday AND Friday.  *If you need a refill on your cardiac medications before your next appointment, please call your pharmacy*  Lab Work: NONE  Testing/Procedures: Your physician has requested that you have a carotid duplex. This test is an ultrasound of the carotid arteries in your neck. It looks at blood flow through these arteries that supply the brain with blood. Allow one hour for this exam. There are no restrictions or special instructions.  Follow-Up: At [facility], you and your health needs are our priority.  As part of our continuing mission to provide you with exceptional heart care, we have created designated Provider Care Teams.  These Care Teams include your primary Cardiologist (physician) and Advanced Practice Providers (APPs -  Physician Assistants and Nurse Practitioners) who all work together to provide you with the care you need, when you need it.  We recommend signing up for the patient portal called "MyChart".  Sign up information is provided on this After Visit Summary.  MyChart is used to connect with patients for Virtual Visits (Telemedicine).  Patients are able to view lab/test results, encounter notes, upcoming appointments, etc.  Non-urgent messages can be sent to your provider as well.   To learn more about what you can do with MyChart, go to [email or web address].    Your next appointment:   1 year(s)  The format for your next appointment:   In Person  Provider:   Quay Burow, MD   Other Instructions CONTINUE YOUR CURRENT FLUID RESTRICTION.

## 2019-05-07 ENCOUNTER — Ambulatory Visit: Payer: Medicare HMO | Admitting: Family Medicine

## 2019-05-08 ENCOUNTER — Ambulatory Visit (HOSPITAL_COMMUNITY)
Admission: RE | Admit: 2019-05-08 | Discharge: 2019-05-08 | Disposition: A | Discharge status: Home / Self Care | Payer: Medicare HMO | Source: Ambulatory Visit | Attending: Internal Medicine | Admitting: Internal Medicine

## 2019-05-08 ENCOUNTER — Other Ambulatory Visit: Payer: Self-pay

## 2019-05-08 DIAGNOSIS — R0989 Other specified symptoms and signs involving the circulatory and respiratory systems: Secondary | ICD-10-CM | POA: Insufficient documentation

## 2019-05-13 ENCOUNTER — Ambulatory Visit (INDEPENDENT_AMBULATORY_CARE_PROVIDER_SITE_OTHER): Payer: Medicare HMO | Admitting: *Deleted

## 2019-05-13 DIAGNOSIS — I5022 Chronic systolic (congestive) heart failure: Secondary | ICD-10-CM | POA: Diagnosis not present

## 2019-05-13 LAB — CUP PACEART REMOTE DEVICE CHECK
Battery Remaining Longevity: 34 mo
Battery Remaining Percentage: 35 %
Battery Voltage: 2.89 V
Brady Statistic AP VP Percent: 17 %
Brady Statistic AP VS Percent: 1 %
Brady Statistic AS VP Percent: 83 %
Brady Statistic AS VS Percent: 1 %
Brady Statistic RA Percent Paced: 16 %
Date Time Interrogation Session: 20210308020016
HighPow Impedance: 75 Ohm
HighPow Impedance: 75 Ohm
Implantable Lead Implant Date: 20160406
Implantable Lead Implant Date: 20160406
Implantable Lead Implant Date: 20160406
Implantable Lead Location: 753858
Implantable Lead Location: 753859
Implantable Lead Location: 753860
Implantable Lead Model: 7122
Implantable Pulse Generator Implant Date: 20160406
Lead Channel Impedance Value: 1300 Ohm
Lead Channel Impedance Value: 380 Ohm
Lead Channel Impedance Value: 660 Ohm
Lead Channel Pacing Threshold Amplitude: 0.75 V
Lead Channel Pacing Threshold Amplitude: 0.75 V
Lead Channel Pacing Threshold Amplitude: 1 V
Lead Channel Pacing Threshold Pulse Width: 0.5 ms
Lead Channel Pacing Threshold Pulse Width: 0.5 ms
Lead Channel Pacing Threshold Pulse Width: 0.5 ms
Lead Channel Sensing Intrinsic Amplitude: 11.9 mV
Lead Channel Sensing Intrinsic Amplitude: 5 mV
Lead Channel Setting Pacing Amplitude: 1.75 V
Lead Channel Setting Pacing Amplitude: 2 V
Lead Channel Setting Pacing Amplitude: 2 V
Lead Channel Setting Pacing Pulse Width: 0.5 ms
Lead Channel Setting Pacing Pulse Width: 0.5 ms
Lead Channel Setting Sensing Sensitivity: 0.5 mV
Pulse Gen Serial Number: 7199559

## 2019-05-14 ENCOUNTER — Encounter: Payer: Self-pay | Admitting: Family Medicine

## 2019-05-14 ENCOUNTER — Encounter: Payer: Self-pay | Admitting: Cardiovascular Disease

## 2019-05-14 ENCOUNTER — Ambulatory Visit (INDEPENDENT_AMBULATORY_CARE_PROVIDER_SITE_OTHER): Payer: Medicare HMO

## 2019-05-14 ENCOUNTER — Other Ambulatory Visit: Payer: Self-pay

## 2019-05-14 ENCOUNTER — Ambulatory Visit (INDEPENDENT_AMBULATORY_CARE_PROVIDER_SITE_OTHER): Payer: Medicare HMO | Admitting: Family Medicine

## 2019-05-14 ENCOUNTER — Ambulatory Visit: Payer: Medicare HMO | Admitting: Cardiovascular Disease

## 2019-05-14 VITALS — BP 106/65 | HR 83 | Temp 97.8°F | Ht 65.0 in | Wt 146.0 lb

## 2019-05-14 DIAGNOSIS — E785 Hyperlipidemia, unspecified: Secondary | ICD-10-CM | POA: Diagnosis not present

## 2019-05-14 DIAGNOSIS — I5022 Chronic systolic (congestive) heart failure: Secondary | ICD-10-CM | POA: Diagnosis not present

## 2019-05-14 DIAGNOSIS — Z95810 Presence of automatic (implantable) cardiac defibrillator: Secondary | ICD-10-CM

## 2019-05-14 DIAGNOSIS — I10 Essential (primary) hypertension: Secondary | ICD-10-CM | POA: Diagnosis not present

## 2019-05-14 DIAGNOSIS — M795 Residual foreign body in soft tissue: Secondary | ICD-10-CM | POA: Diagnosis not present

## 2019-05-14 DIAGNOSIS — R0989 Other specified symptoms and signs involving the circulatory and respiratory systems: Secondary | ICD-10-CM

## 2019-05-14 DIAGNOSIS — Z794 Long term (current) use of insulin: Secondary | ICD-10-CM

## 2019-05-14 DIAGNOSIS — Z862 Personal history of diseases of the blood and blood-forming organs and certain disorders involving the immune mechanism: Secondary | ICD-10-CM | POA: Diagnosis not present

## 2019-05-14 DIAGNOSIS — Z720 Tobacco use: Secondary | ICD-10-CM

## 2019-05-14 DIAGNOSIS — E119 Type 2 diabetes mellitus without complications: Secondary | ICD-10-CM | POA: Diagnosis not present

## 2019-05-14 DIAGNOSIS — I2510 Atherosclerotic heart disease of native coronary artery without angina pectoris: Secondary | ICD-10-CM

## 2019-05-14 DIAGNOSIS — Z9861 Coronary angioplasty status: Secondary | ICD-10-CM

## 2019-05-14 LAB — BASIC METABOLIC PANEL
BUN/Creatinine Ratio: 15 (ref 12–28)
BUN: 19 mg/dL (ref 8–27)
CO2: 29 mmol/L (ref 20–29)
Calcium: 10.1 mg/dL (ref 8.7–10.3)
Chloride: 100 mmol/L (ref 96–106)
Creatinine, Ser: 1.31 mg/dL — ABNORMAL HIGH (ref 0.57–1.00)
GFR calc Af Amer: 45 mL/min/{1.73_m2} — ABNORMAL LOW (ref >59)
GFR calc non Af Amer: 39 mL/min/{1.73_m2} — ABNORMAL LOW (ref >59)
Glucose: 113 mg/dL — ABNORMAL HIGH (ref 65–99)
Potassium: 5.4 mmol/L — ABNORMAL HIGH (ref 3.5–5.2)
Sodium: 143 mmol/L (ref 134–144)

## 2019-05-14 NOTE — Progress Notes (Signed)
EPIC Encounter for ICM Monitoring  Patient Name: [name] is a 79 y.o. female Date: 05/14/2019 Primary Care Physican: Rutherford Guys, MD Primary Cardiologist:Berry Electrophysiologist: Vergie Living Pacing: >99% LastWeight:137.7lbs  Transmission reviewed.  [place]vue thoracic impedanceabove baseline suggesting possible dryness starting 3/5.  Prescribed: Furosemide 40 mgon Monday Wednesday and Friday take 2 tabs by mouth in AM and take 1 tab at PM.  Potassium 20 mEq On Monday, Wednesday AND Friday TAKE 1 TAB BY MOUTH 2 TIMES A DAY.  Labs: 04/17/2019 Creatinine 1.22, BUN 16, Potassium 4.3, Sodium 143, GFR 43-49 12/27/2018 Creatinine 1.29, BUN 17, Potassium 4.8, Sodium 143, GFR 40-46 10/24/2018 Creatinine 1.13, BUN 13, Potassium 4.1, Sodium 141, GFR 47-55 08/20/2018 Creatinine 1.19, BUN 13, Potassium 3.8, Sodium 144, GFR 44-51 05/05/2018 Creatinine 1.36, BUN 19, Potassium 3.9, Sodium 146, GFR 38-44 A complete set of results can be found in Results Review.  Recommendations:None  Follow-up plan: ICM clinic phone appointment on4/02/2020. 91 day device clinic remote transmission 08/12/2019.   Copy of ICM check sent to [place].   3 month ICM trend: 05/13/2019    1 Year ICM trend:       Allison Billings, RN 05/14/2019 7:50 AM

## 2019-05-14 NOTE — Patient Instructions (Addendum)
Medication Instructions:  Your physician recommends that you continue on your current medications as directed. Please refer to the Current Medication list given to you today.  If you need a refill on your cardiac medications before your next appointment, please call your pharmacy.   Lab work: BMET If you have labs (blood work) drawn today and your tests are completely normal, you will receive your results only by: [place] (if you have MyChart) OR A paper copy in the mail If you have any lab test that is abnormal or we need to change your treatment, we will call you to review the results.  Testing/Procedures: Coronary neck CT  Follow-Up: At [facility], you and your health needs are our priority.  As part of our continuing mission to provide you with exceptional heart care, we have created designated Provider Care Teams.  These Care Teams include your primary Cardiologist (physician) and Advanced Practice Providers (APPs -  Physician Assistants and Nurse Practitioners) who all work together to provide you with the care you need, when you need it. You may see Quay Burow, MD or one of the following Advanced Practice Providers on your designated Care Team:    Kerin Ransom, PA-C  [place], [place]  Coletta Memos, [place]  Your physician wants you to follow-up in: 6 months with Dr. Gwenlyn Found. You will receive a reminder letter in the mail two months in advance. If you don't receive a letter, please call our office to schedule the follow-up appointment.   Any Other Special Instructions Will Be Listed Below (If Applicable).  Your cardiac CT will be scheduled at:   [facility] [address] [place], [place] [address] [phone]   Please arrive at the [facility] main entrance of [facility] 30 minutes prior to test start time. Proceed to the [facility] Radiology Department (first floor) to check-in and test prep.  Please follow these instructions  carefully (unless otherwise directed):  On the Night Before the Test: . Be sure to Drink plenty of water. . Do not take any antihistamines 12 hours prior to your test. . If you take Metformin do not take 24 hours prior to test.  On the Day of the Test: . Drink plenty of water. Do not drink any water within one hour of the test. . Do not eat any food 4 hours prior to the test. . You may take your regular medications prior to the test.  . HOLD Furosemide/Hydrochlorothiazide morning of the test. . FEMALES- please wear underwire-free bra if available       After the Test: . Drink plenty of water. . After receiving IV contrast, you may experience a mild flushed feeling. This is normal. . On occasion, you may experience a mild rash up to 24 hours after the test. This is not dangerous. If this occurs, you can take Benadryl 25 mg and increase your fluid intake. . If you experience trouble breathing, this can be serious. If it is severe call 911 IMMEDIATELY. If it is mild, please call our office. . If you take any of these medications: Glipizide/Metformin, Avandament, Glucavance, please do not take 48 hours after completing test unless otherwise instructed.   Once we have confirmed authorization from your insurance company, we will call you to set up a date and time for your test.   For non-scheduling related questions, please contact the cardiac imaging nurse navigator should you have any questions/concerns: Marchia Bond, RN Navigator Cardiac Imaging [facility]  [facility] and V[facility] Services [phone] office

## 2019-05-14 NOTE — Assessment & Plan Note (Signed)
History of essential hypertension with blood pressure measured today 138/62 he is on carvedilol.

## 2019-05-14 NOTE — Progress Notes (Signed)
3/9/20214:26 PM  [name] November 15, 1940, 79 y.o., female [id number]  Chief Complaint  Patient presents with  . R thumb splinter    x 1 month    HPI:   Patient is a 79 y.o. female who presents today for splinter   Patient states that about a month ago she got a splinter in her right thumb while working among her roses She tried to take it out, but a small piece seems to remain Area not tender, no redness, warmth, swelling or drainage  Also wondering about accuracy of most recent a1c vs her glucometer as she is getting very high readings at home She denies any sx of hypogylcemia  Lab Results  Component Value Date   HGBA1C 5.6 04/17/2019    Depression screen [facility] 2/9 04/15/2019 04/11/2019 03/04/2019  Decreased Interest 3 0 0  Down, Depressed, Hopeless 3 0 0  PHQ - 2 Score 6 0 0  Altered sleeping 3 - -  Tired, decreased energy 3 - -  Change in appetite 3 - -  Feeling bad or failure about yourself  - - -  Trouble concentrating - - -  Moving slowly or fidgety/restless - - -  Suicidal thoughts - - -  PHQ-9 Score 15 - -  Difficult doing work/chores - - -  Some recent data might be hidden    Fall Risk  04/11/2019 03/04/2019 12/13/2018 10/29/2018 10/24/2018  Falls in the past year? 0 0 0 1 0  Number falls in past yr: 0 0 0 1 -  Injury with Fall? 0 0 0 1 0  Risk for fall due to : - Impaired mobility - - -  Follow up Falls evaluation completed;Education provided - - Falls evaluation completed -     Allergies  Allergen Reactions  . Valium [Diazepam] Swelling    face    Prior to Admission medications   Medication Sig Start Date End Date Taking? Authorizing Provider  ALPRAZolam Duanne Moron) 1 MG tablet Take 1 mg by mouth daily as needed. 04/25/18  Yes [provider]  aspirin EC 81 MG tablet Take 81 mg by mouth at bedtime.   Yes [provider]  blood glucose meter kit and supplies KIT Dispense based on patient and insurance preference. Use three times a day as  directed. Dx. E11.65, Z79.4 05/15/18  Yes Rutherford Guys, MD  carvedilol (COREG) 12.5 MG tablet TAKE 1 1/2 TABLET BY MOUTH TWICE A DAY 03/13/19  Yes Deboraha Sprang, MD  citalopram (CELEXA) 20 MG tablet Take 20 mg by mouth daily. 12/08/18  Yes [provider]  clopidogrel (PLAVIX) 75 MG tablet Take 1 tablet by mouth once daily 02/15/19  Yes Lorretta Harp, MD  furosemide (LASIX) 40 MG tablet ON Monday Wednesday AND Friday TAKE 2 TABS BY MOUTH IN A.M AND TAKE 1 TAB AT P.M. 05/06/19  Yes Kilroy, Luke K, PA-C  insulin aspart (NOVOLOG FLEXPEN) 100 UNIT/ML FlexPen Per insulin sliding scale, max TTD, 20 units. 06/26/17  Yes Rutherford Guys, MD  Insulin Detemir (LEVEMIR) 100 UNIT/ML Pen Inject 16 Units into the skin daily at 10 pm. 09/04/18  Yes Rutherford Guys, MD  Insulin Pen Needle (BD PEN NEEDLE MICRO U/F) 32G X 6 MM MISC USE NEW NEEDLE FOR EACH INJECTION OF INSULIN, FOUR TIMES DAILY 04/30/19  Yes Rutherford Guys, MD  levothyroxine (SYNTHROID) 88 MCG tablet TAKE 1 TABLET BY MOUTH AT BEDTIME 03/13/19  Yes Rutherford Guys, MD  mirtazapine (REMERON)  30 MG tablet Take 1 tablet (30 mg total) by mouth at bedtime. 12/27/18  Yes Rutherford Guys, MD  Moringa Oleifera (MORINGA PO) Take 5,000 mg by mouth.   Yes [provider]  nitroGLYCERIN (NITROSTAT) 0.4 MG SL tablet Place 1 tablet (0.4 mg total) under the tongue every 5 (five) minutes as needed for chest pain. 05/06/19  Yes Kilroy, Luke K, PA-C  ONE TOUCH ULTRA TEST test strip  06/07/17  Yes [provider]  Jonetta Speak LANCETS 57S [place]  06/07/17  Yes [provider]  oxyCODONE-acetaminophen (PERCOCET/ROXICET) 5-325 MG tablet Take by mouth every 4 (four) hours as needed for severe pain.   Yes [provider]  potassium chloride SA (KLOR-CON) 20 MEQ tablet Take 1 tablet (20 mEq total) by mouth 2 (two) times daily. ON Monday, Wednesday AND Friday TAKE 1 TAB BY MOUTH 2 TIMES A DAY. 05/06/19  Yes Kilroy, Luke K, PA-C    simvastatin (ZOCOR) 20 MG tablet TAKE 1 TABLET BY MOUTH ONCE DAILY WITH SUPPER 06/25/18  Yes Rutherford Guys, MD  triamcinolone cream (KENALOG) 0.1 % APPLY ONE APPLICATION TOPICALLY TWO TIMES DAILY 11/28/18  Yes Rutherford Guys, MD    Past Medical History:  Diagnosis Date  . Anxiety   . Arthritis   . Chronic pain    a. Prior h/o chronic pain on methadone.  . Chronic systolic CHF (congestive heart failure) (HCC)    a. mixed ischemic/non-ischemic cardiomyopathy. b. s/p CRT-D in 06/2014.  Marland Kitchen CKD (chronic kidney disease), stage III   . Complication of anesthesia    hard time waking her up from general surgery  . Coronary artery disease    a. remote RCA stenting in 2008 with non-DES. b. Cath 06/2014 following abnormal nuc: Stable, unchanged from prior cath, patent stent and 40% LM.  . Diabetes mellitus ([place])   . GERD (gastroesophageal reflux disease)   . Headache   . Hyperlipidemia   . Hypertension   . LBBB (left bundle branch block)   . Nonischemic cardiomyopathy ([place])   . OSA (obstructive sleep apnea)    AHI-9.77/hr, during REM-50.32/hr  . Tobacco abuse     Past Surgical History:  Procedure Laterality Date  . BACK SURGERY  2013  . BI-VENTRICULAR IMPLANTABLE CARDIOVERTER DEFIBRILLATOR N/A 06/11/2014   STJ CRTD implanted by Dr Caryl Comes  . CARDIAC CATHETERIZATION  12/14/2006   RCA stented with a 3.0 [employer] Liberte stent resulting in a reduction of 75% to 0% residual  . CHOLECYSTECTOMY     20 years ago  . COLONOSCOPY WITH PROPOFOL N/A 12/15/2014   Procedure: COLONOSCOPY WITH PROPOFOL;  Surgeon: Clarene Essex, MD;  Location: [facility] ENDOSCOPY;  Service: Endoscopy;  Laterality: N/A;  . EYE SURGERY     bilateral cataract surgery   . LEFT AND RIGHT HEART CATHETERIZATION WITH CORONARY ANGIOGRAM N/A 05/29/2014   Procedure: LEFT AND RIGHT HEART CATHETERIZATION WITH CORONARY ANGIOGRAM;  Surgeon: Lorretta Harp, MD;  Location: [facility] CATH LAB;  Service: Cardiovascular;  Laterality: N/A;  . LEFT  HEART CATHETERIZATION WITH CORONARY ANGIOGRAM N/A 12/27/2012   Procedure: LEFT HEART CATHETERIZATION WITH CORONARY ANGIOGRAM;  Surgeon: Lorretta Harp, MD;  Location: [facility] CATH LAB;  Service: Cardiovascular;  Laterality: N/A;    Social History   Tobacco Use  . Smoking status: Current Every Day Smoker    Packs/day: 2.00    Years: 58.00    Pack years: 116.00    Types: Cigarettes    Start date: 12/26/1952  Last attempt to quit: 12/05/2013    Years since quitting: 5.4  . Smokeless tobacco: Never Used  . Tobacco comment: uses Vape cigarettes  Substance Use Topics  . Alcohol use: No    Alcohol/week: 0.0 standard drinks    Family History  Problem Relation Age of Onset  . Stroke Mother   . Hypertension Mother   . Coronary artery disease Father   . Stroke Brother   . Heart disease Brother   . Other Brother        H1N1 VIRUS  . Healthy Sister   . Healthy Sister     ROS Per hpi  OBJECTIVE:  Today's Vitals   05/14/19 1623  BP: 106/65  Pulse: 83  Temp: 97.8 F (36.6 C)  TempSrc: Temporal  SpO2: 98%  Weight: 146 lb (66.2 kg)  Height: _0  (1.651 m)   Body mass index is 24.3 kg/m.   Physical Exam Vitals and nursing note reviewed.  Constitutional:      Appearance: She is well-developed.  HENT:     Head: Normocephalic and atraumatic.  Eyes:     General: No scleral icterus.    Conjunctiva/sclera: Conjunctivae normal.     Pupils: Pupils are equal, round, and reactive to light.  Pulmonary:     Effort: Pulmonary effort is normal.  Musculoskeletal:     Cervical back: Neck supple.  Skin:    General: Skin is warm and dry.     Comments: Right thumb pad with a small granuloma with a tiny black spot in the middle, ?FB  Neurological:     Mental Status: She is alert and oriented to person, place, and time.        No results found for this or any previous visit (from the past 24 hour(s)).  No results found.   ASSESSMENT and PLAN  1. Foreign body (FB) in soft  tissue Not infected not bothersome. Reassured patient  2. Insulin dependent type 2 diabetes mellitus ([place]) a1c not c/w home cbg readings. Checking CBC and fructosamine. Bring glucometer to compare with ours.  - Fructosamine  3. History of anemia - CBC  Return in about 3 months (around 08/14/2019).    Rutherford Guys, MD Primary Care at [place], [place] [address] Ph.  [phone] Fax [phone]

## 2019-05-14 NOTE — Progress Notes (Signed)
ICD Remote  

## 2019-05-14 NOTE — Assessment & Plan Note (Signed)
History of ongoing tobacco abuse of 1-1/2 packs a day recalcitrant risk factor modification.

## 2019-05-14 NOTE — Assessment & Plan Note (Signed)
History of dyslipidemia on statin therapy with lipid profile performed 04/17/2019 revealing total cholesterol of 146, LDL 68 HDL 71.

## 2019-05-14 NOTE — Assessment & Plan Note (Signed)
History of CAD status post RCA stenting in the past.  She underwent catheterization by Dr. Melvern Banker 05/04/2007 after abnormal Myoview stress test that showed a patent RCA stent with mild left main disease.  She was cathed in 2012 and again 2014 revealing unchanged anatomy.  I performed cardiac catheterization on her 05/29/2014 revealing unchanged coronary anatomy with severe LV dysfunction.

## 2019-05-14 NOTE — Progress Notes (Signed)
05/14/2019 Arlin M Amick   1940/09/08  [id number]  Primary Physician Rutherford Guys, MD Primary Cardiologist: Lorretta Harp MD FACP, [place], [place], [place]  HPI:  [name] is a 79 y.o.  mildly overweight married Caucasian female, mother of 23, grandmother of 2 grandchildren, whom I last saw in the office  11/14/2018.Unfortunately, there are 65 year old son committed suicide by shooting himself 2 weeks ago and they're very distraught at the current time.She has a history of CAD, status post RCA stenting in the past. She was catheterized by Dr. Janene Madeira May 04, 2007, after abnormal Myoview revealing 30% to 40% eccentric distal left main. A proximal RCA stent was patent and her EF was normal. Other problems include hypertension, hyperlipidemia, and diabetes. She has chronic left bundle branch block. She smokes 1-1/2 packs of cigarettes a day. She has had back surgery by Dr. Lissa Merlin. Myoview stress test performed March 24, 2010, revealed new anteroapical scar, and because of shortness of breath I catheterized her March 31, 2010, revealing 50% "in-stent restenosis" within the RCA stent, which was smooth, 40% distal left main with anatomy unchanged from the prior study and normal LV function. She really denies chest pain or shortness of breath. Her most recent lab work, performed by Dr. Buddy Duty in June, revealed a total cholesterol of 138, LDL of 57, HDL of 44.  I saw her one year ago. Over the last several weeks she developed new onset chest pressure occurring every other day lasting for minutes at a time associated with diaphoresis. I'm concerned that she may have progression of her left main disease or and/or her RCA in-stent restenosis. Her Myoview performed prior to her last cath in January 2012 was remarkable for anteroapical scar versus breast attenuation artifact. Based on her anatomy and her symptoms I elected to proceed with outpatient diagnostic coronary arteriography which I performed  on 12/26/12 revealing essentially unchanged anatomy. Her RCA stent was patent her left main was mild. She did have mild to moderate left ventricular dysfunction with an EF in the 45% range. 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 She gets occasional chest pain which has not changed in frequency or severity. She does complain of back pain and leg pain and is seeing Dr. Ellene Route for this. She has decreased pedal pulses on exam raising the issue of the possibility of peripheral vascular disease. Lower extremity arterial Doppler studies performed in our office 03/14/14 revealed ABIs of 1 bilaterally high-frequency signal in the left external iliac artery although her symptoms are symmetric. She saw Molli Hazard in the office 05/14/14 complaining of dyspnea on exertion and diaphoresis. A 2-D echo performed 05/20/14 as well as a Myoview stress test showed significant worsening of LV function with an EF 20%, a dilated left ventricle and moderate MR with a Myoview that showed scar in the anterior wall apex and septum markedly different than prior functional studies. Based on this result I performed cardiac catheterization on her 05/29/14 revealing unchanged coronary anatomy which was deemed as moderate and stable with severe LV dysfunction and elevated filling pressures. She subsequently underwent biventricular ICD implantation by Dr. Caryl Comes on 06/11/14 with an excellent clinical result which was almost immediate. She now denies chest pain or shortness of breath. Her recent 2-D echo performed 10/22/15 revealed an EF of 50-55%. Since I saw her a year ago she is remained clinically stable.  Dr. Caryl Comes  follows her biventricular ICD and last saw her in the office 09/17/2018.  Unfortunately, her husband Genell Thede, also a patient of mine expired at home 07/05/2018 after being married 37 years.  She still visibly grieving.  Her major medical  complaints are of asymptomatic hypotension with blood pressures occasionally in the 90s.  She is been told to liberalize her salt intake and decrease her furosemide dose.  Since I saw her last she did see Kerin Ransom, PA-C who recommended that she decrease her furosemide to every other day.  She was somewhat hypotensive when she was last seen and her blood pressure has nicely come up.  She denies symptoms of heart failure.  She continues to smoke a pack and a half a day.  Recent carotid Dopplers performed 05/06/2019 suggested significant left common carotid artery stenosis.    Current Meds  Medication Sig  . ALPRAZolam (XANAX) 1 MG tablet Take 1 mg by mouth daily as needed.  Marland Kitchen aspirin EC 81 MG tablet Take 81 mg by mouth at bedtime.  . blood glucose meter kit and supplies KIT Dispense based on patient and insurance preference. Use three times a day as directed. Dx. E11.65, Z79.4  . carvedilol (COREG) 12.5 MG tablet TAKE 1 1/2 TABLET BY MOUTH TWICE A DAY  . citalopram (CELEXA) 20 MG tablet Take 20 mg by mouth daily.  . clopidogrel (PLAVIX) 75 MG tablet Take 1 tablet by mouth once daily  . furosemide (LASIX) 40 MG tablet ON Monday Wednesday AND Friday TAKE 2 TABS BY MOUTH IN A.M AND TAKE 1 TAB AT P.M.  . insulin aspart (NOVOLOG FLEXPEN) 100 UNIT/ML FlexPen Per insulin sliding scale, max TTD, 20 units.  . Insulin Detemir (LEVEMIR) 100 UNIT/ML Pen Inject 16 Units into the skin daily at 10 pm.  . Insulin Pen Needle (BD PEN NEEDLE MICRO U/F) 32G X 6 MM MISC USE NEW NEEDLE FOR EACH INJECTION OF INSULIN, FOUR TIMES DAILY  . levothyroxine (SYNTHROID) 88 MCG tablet TAKE 1 TABLET BY MOUTH AT BEDTIME  . mirtazapine (REMERON) 30 MG tablet Take 1 tablet (30 mg total) by mouth at bedtime.  Edyth Gunnels Oleifera (MORINGA PO) Take 5,000 mg by mouth.  . nitroGLYCERIN (NITROSTAT) 0.4 MG SL tablet Place 1 tablet (0.4 mg total) under the tongue every 5 (five) minutes as needed for chest pain.  . ONE TOUCH ULTRA TEST  test strip   . ONETOUCH DELICA LANCETS 16W MISC   . oxyCODONE-acetaminophen (PERCOCET/ROXICET) 5-325 MG tablet Take by mouth every 4 (four) hours as needed for severe pain.  . potassium chloride SA (KLOR-CON) 20 MEQ tablet Take 1 tablet (20 mEq total) by mouth 2 (two) times daily. ON Monday, Wednesday AND Friday TAKE 1 TAB BY MOUTH 2 TIMES A DAY.  . simvastatin (ZOCOR) 20 MG tablet TAKE 1 TABLET BY MOUTH ONCE DAILY WITH SUPPER  . triamcinolone cream (KENALOG) 0.1 % APPLY ONE APPLICATION TOPICALLY TWO TIMES DAILY     Allergies  Allergen Reactions  . Valium [Diazepam] Swelling    face    Social History   Socioeconomic History  . Marital status: Widowed    Spouse name: Not on file  . Number of children: Not on file  . Years of education: Not on file  . Highest education level: Not on file  Occupational History  . Not on file  Tobacco Use  . Smoking status: Current Every Day Smoker    Packs/day: 2.00    Years: 58.00    Pack  years: 116.00    Types: Cigarettes    Start date: 12/26/1952    Last attempt to quit: 12/05/2013    Years since quitting: 5.4  . Smokeless tobacco: Never Used  . Tobacco comment: uses Vape cigarettes  Substance and Sexual Activity  . Alcohol use: No    Alcohol/week: 0.0 standard drinks  . Drug use: No  . Sexual activity: Not on file  Other Topics Concern  . Not on file  Social History Narrative   Patient lives in [place] with his husband and grandson.   Son completed suicide on September 17th of this year, patient found him.   Social Determinants of Health   Financial Resource Strain:   . Difficulty of Paying Living Expenses: Not on file  Food Insecurity:   . Worried About Charity fundraiser in the Last Year: Not on file  . Ran Out of Food in the Last Year: Not on file  Transportation Needs:   . Lack of Transportation (Medical): Not on file  . Lack of Transportation (Non-Medical): Not on file  Physical Activity:   . Days of Exercise per  Week: Not on file  . Minutes of Exercise per Session: Not on file  Stress:   . Feeling of Stress : Not on file  Social Connections:   . Frequency of Communication with Friends and Family: Not on file  . Frequency of Social Gatherings with Friends and Family: Not on file  . Attends Religious Services: Not on file  . Active Member of Clubs or Organizations: Not on file  . Attends Archivist Meetings: Not on file  . Marital Status: Not on file  Intimate Partner Violence:   . Fear of Current or Ex-Partner: Not on file  . Emotionally Abused: Not on file  . Physically Abused: Not on file  . Sexually Abused: Not on file     Review of Systems: General: negative for chills, fever, night sweats or weight changes.  Cardiovascular: negative for chest pain, dyspnea on exertion, edema, orthopnea, palpitations, paroxysmal nocturnal dyspnea or shortness of breath Dermatological: negative for rash Respiratory: negative for cough or wheezing Urologic: negative for hematuria Abdominal: negative for nausea, vomiting, diarrhea, bright red blood per rectum, melena, or hematemesis Neurologic: negative for visual changes, syncope, or dizziness All other systems reviewed and are otherwise negative except as noted above.    Blood pressure 138/72, pulse 79, temperature (!) 97.4 F (36.3 C), resp. rate (!) 21, height '5\' 5"'  (1.651 m), weight 145 lb 9.6 oz (66 kg), SpO2 95 %.  General appearance: alert and no distress Neck: no adenopathy, no JVD, supple, symmetrical, trachea midline, thyroid not enlarged, symmetric, no tenderness/mass/nodules and Left carotid bruit Lungs: clear to auscultation bilaterally Heart: regular rate and rhythm, S1, S2 normal, no murmur, click, rub or gallop Extremities: extremities normal, atraumatic, no cyanosis or edema Pulses: 2+ and symmetric Skin: Skin color, texture, turgor normal. No rashes or lesions Neurologic: Alert and oriented X 3, normal strength and tone.  Normal symmetric reflexes. Normal coordination and gait  EKG atrial sensed, ventricular paced rhythm.  I personally reviewed this EKG.  ASSESSMENT AND PLAN:   Essential hypertension History of essential hypertension with blood pressure measured today 138/62 he is on carvedilol.  Dyslipidemia, goal LDL below 70 History of dyslipidemia on statin therapy with lipid profile performed 04/17/2019 revealing total cholesterol of 146, LDL 68 HDL 71.  Tobacco abuse History of ongoing tobacco abuse of 1-1/2 packs a day recalcitrant risk  factor modification.  CAD S/P percutaneous coronary angioplasty History of CAD status post RCA stenting in the past.  She underwent catheterization by Dr. Melvern Banker 05/04/2007 after abnormal Myoview stress test that showed a patent RCA stent with mild left main disease.  She was cathed in 2012 and again 2014 revealing unchanged anatomy.  I performed cardiac catheterization on her 05/29/2014 revealing unchanged coronary anatomy with severe LV dysfunction.  ICD (implantable cardioverter-defibrillator), biventricular, in situ History of biventricular ICD implantation by Dr. Caryl Comes 06/11/2014 with an excellent result.  Her EF immediately improved up to 50 to 55%.  Carotid bruit present Recent carotid Doppler studies suggested significant left common carotid artery stenosis.  Based on this, I am going to get a CT angiogram to further evaluate the severity of the stenosis.      Lorretta Harp MD FACP,FACC,FAHA, [facility] 05/14/2019 11:03 AM

## 2019-05-14 NOTE — Patient Instructions (Signed)
° ° ° °  If you have lab work done today you will be contacted with your lab results within the next 2 weeks.  If you have not heard from us then please contact us. The fastest way to get your results is to register for My Chart. ° ° °IF you received an x-ray today, you will receive an invoice from [facility]. Please contact [facility] at [phone] with questions or concerns regarding your invoice.  ° °IF you received labwork today, you will receive an invoice from [facility]. Please contact [facility] at [phone] with questions or concerns regarding your invoice.  ° °Our billing staff will not be able to assist you with questions regarding bills from these companies. ° °You will be contacted with the lab results as soon as they are available. The fastest way to get your results is to activate your My Chart account. Instructions are located on the last page of this paperwork. If you have not heard from us regarding the results in 2 weeks, please contact this office. °  ° ° ° °

## 2019-05-14 NOTE — Assessment & Plan Note (Signed)
Recent carotid Doppler studies suggested significant left common carotid artery stenosis.  Based on this, I am going to get a CT angiogram to further evaluate the severity of the stenosis.

## 2019-05-14 NOTE — Assessment & Plan Note (Signed)
History of biventricular ICD implantation by Dr. Caryl Comes 06/11/2014 with an excellent result.  Her EF immediately improved up to 50 to 55%.

## 2019-05-15 LAB — CBC
Hematocrit: 44.9 % (ref 34.0–46.6)
Hemoglobin: 15.5 g/dL (ref 11.1–15.9)
MCH: 32.6 pg (ref 26.6–33.0)
MCHC: 34.5 g/dL (ref 31.5–35.7)
MCV: 94 fL (ref 79–97)
Platelets: 235 10*3/uL (ref 150–450)
RBC: 4.76 x10E6/uL (ref 3.77–5.28)
RDW: 12.6 % (ref 11.7–15.4)
WBC: 6.8 10*3/uL (ref 3.4–10.8)

## 2019-05-15 LAB — FRUCTOSAMINE: Fructosamine: 289 umol/L — ABNORMAL HIGH (ref 0–285)

## 2019-05-16 ENCOUNTER — Ambulatory Visit (INDEPENDENT_AMBULATORY_CARE_PROVIDER_SITE_OTHER): Payer: Medicare HMO | Admitting: Family Medicine

## 2019-05-16 ENCOUNTER — Other Ambulatory Visit: Payer: Self-pay

## 2019-05-16 ENCOUNTER — Encounter: Payer: Self-pay | Admitting: Family Medicine

## 2019-05-16 VITALS — BP 128/75 | HR 98 | Temp 98.4°F | Ht 65.0 in | Wt 146.0 lb

## 2019-05-16 DIAGNOSIS — R1313 Dysphagia, pharyngeal phase: Secondary | ICD-10-CM | POA: Diagnosis not present

## 2019-05-16 DIAGNOSIS — Z8521 Personal history of malignant neoplasm of larynx: Secondary | ICD-10-CM | POA: Diagnosis not present

## 2019-05-16 DIAGNOSIS — Z794 Long term (current) use of insulin: Secondary | ICD-10-CM | POA: Diagnosis not present

## 2019-05-16 DIAGNOSIS — L821 Other seborrheic keratosis: Secondary | ICD-10-CM | POA: Diagnosis not present

## 2019-05-16 DIAGNOSIS — R490 Dysphonia: Secondary | ICD-10-CM | POA: Diagnosis not present

## 2019-05-16 DIAGNOSIS — E119 Type 2 diabetes mellitus without complications: Secondary | ICD-10-CM | POA: Diagnosis not present

## 2019-05-16 DIAGNOSIS — R69 Illness, unspecified: Secondary | ICD-10-CM | POA: Diagnosis not present

## 2019-05-16 DIAGNOSIS — J343 Hypertrophy of nasal turbinates: Secondary | ICD-10-CM | POA: Diagnosis not present

## 2019-05-16 MED ORDER — BLOOD GLUCOSE MONITOR KIT: PACK | 11 refills | Status: DC

## 2019-05-16 NOTE — Patient Instructions (Signed)
° ° ° °  If you have lab work done today you will be contacted with your lab results within the next 2 weeks.  If you have not heard from us then please contact us. The fastest way to get your results is to register for My Chart. ° ° °IF you received an x-ray today, you will receive an invoice from [facility]. Please contact [facility] at [phone] with questions or concerns regarding your invoice.  ° °IF you received labwork today, you will receive an invoice from [facility]. Please contact [facility] at [phone] with questions or concerns regarding your invoice.  ° °Our billing staff will not be able to assist you with questions regarding bills from these companies. ° °You will be contacted with the lab results as soon as they are available. The fastest way to get your results is to activate your My Chart account. Instructions are located on the last page of this paperwork. If you have not heard from us regarding the results in 2 weeks, please contact this office. °  ° ° ° °

## 2019-05-16 NOTE — Progress Notes (Signed)
3/11/20214:30 PM  [name] [birth year], 79 y.o., female [id number]  Chief Complaint  Patient presents with  . Nevus    has am mole on the back she is concern about, also brought iin glucose meter for compare    HPI:   Patient is a 79 y.o. female who presents today for itchy mole on her back, scratched it and it started to bleed She also brings in her glucometer to compare Ours 29 - hers 69 She never started trulicity Fructosamine 254 ~ a1c 6.5 Lab Results  Component Value Date   WBC 6.8 05/14/2019   HGB 15.5 05/14/2019   HCT 44.9 05/14/2019   MCV 94 05/14/2019   PLT 235 05/14/2019    Depression screen PHQ 2/9 04/15/2019 04/11/2019 03/04/2019  Decreased Interest 3 0 0  Down, Depressed, Hopeless 3 0 0  PHQ - 2 Score 6 0 0  Altered sleeping 3 - -  Tired, decreased energy 3 - -  Change in appetite 3 - -  Feeling bad or failure about yourself  - - -  Trouble concentrating - - -  Moving slowly or fidgety/restless - - -  Suicidal thoughts - - -  PHQ-9 Score 15 - -  Difficult doing work/chores - - -  Some recent data might be hidden    Fall Risk  04/11/2019 03/04/2019 12/13/2018 10/29/2018 10/24/2018  Falls in the past year? 0 0 0 1 0  Number falls in past yr: 0 0 0 1 -  Injury with Fall? 0 0 0 1 0  Risk for fall due to : - Impaired mobility - - -  Follow up Falls evaluation completed;Education provided - - Falls evaluation completed -     Allergies  Allergen Reactions  . Valium [Diazepam] Swelling    face    Prior to Admission medications   Medication Sig Start Date End Date Taking? Authorizing Provider  ALPRAZolam Duanne Moron) 1 MG tablet Take 1 mg by mouth daily as needed. 04/25/18  Yes [provider]  aspirin EC 81 MG tablet Take 81 mg by mouth at bedtime.   Yes [provider]  blood glucose meter kit and supplies KIT Dispense based on patient and insurance preference. Use three times a day as directed. Dx. E11.65, Z79.4 05/15/18  Yes Rutherford Guys, MD  carvedilol (COREG) 12.5 MG tablet TAKE 1 1/2 TABLET BY MOUTH TWICE A DAY 03/13/19  Yes Deboraha Sprang, MD  citalopram (CELEXA) 20 MG tablet Take 20 mg by mouth daily. 12/08/18  Yes [provider]  clopidogrel (PLAVIX) 75 MG tablet Take 1 tablet by mouth once daily 02/15/19  Yes Lorretta Harp, MD  furosemide (LASIX) 40 MG tablet ON Monday Wednesday AND Friday TAKE 2 TABS BY MOUTH IN A.M AND TAKE 1 TAB AT P.M. 05/06/19  Yes Kilroy, Luke K, PA-C  insulin aspart (NOVOLOG FLEXPEN) 100 UNIT/ML FlexPen Per insulin sliding scale, max TTD, 20 units. 06/26/17  Yes Rutherford Guys, MD  Insulin Detemir (LEVEMIR) 100 UNIT/ML Pen Inject 16 Units into the skin daily at 10 pm. 09/04/18  Yes Rutherford Guys, MD  Insulin Pen Needle (BD PEN NEEDLE MICRO U/F) 32G X 6 MM MISC USE NEW NEEDLE FOR EACH INJECTION OF INSULIN, FOUR TIMES DAILY 04/30/19  Yes Rutherford Guys, MD  levothyroxine (SYNTHROID) 88 MCG tablet TAKE 1 TABLET BY MOUTH AT BEDTIME 03/13/19  Yes Rutherford Guys, MD  mirtazapine (REMERON) 30 MG tablet Take 1 tablet (30  mg total) by mouth at bedtime. 12/27/18  Yes Rutherford Guys, MD  Moringa Oleifera (MORINGA PO) Take 5,000 mg by mouth.   Yes [provider]  nitroGLYCERIN (NITROSTAT) 0.4 MG SL tablet Place 1 tablet (0.4 mg total) under the tongue every 5 (five) minutes as needed for chest pain. 05/06/19  Yes Kilroy, Luke K, PA-C  ONE TOUCH ULTRA TEST test strip  06/07/17  Yes [provider]  Jonetta Speak LANCETS 16X [place]  06/07/17  Yes [provider]  oxyCODONE-acetaminophen (PERCOCET/ROXICET) 5-325 MG tablet Take by mouth every 4 (four) hours as needed for severe pain.   Yes [provider]  potassium chloride SA (KLOR-CON) 20 MEQ tablet Take 1 tablet (20 mEq total) by mouth 2 (two) times daily. ON Monday, Wednesday AND Friday TAKE 1 TAB BY MOUTH 2 TIMES A DAY. 05/06/19  Yes Kilroy, Luke K, PA-C  simvastatin (ZOCOR) 20 MG tablet TAKE 1 TABLET BY MOUTH ONCE  DAILY WITH SUPPER 06/25/18  Yes Rutherford Guys, MD  triamcinolone cream (KENALOG) 0.1 % APPLY ONE APPLICATION TOPICALLY TWO TIMES DAILY 11/28/18  Yes Rutherford Guys, MD    Past Medical History:  Diagnosis Date  . Anxiety   . Arthritis   . Chronic pain    a. Prior h/o chronic pain on methadone.  . Chronic systolic CHF (congestive heart failure) (HCC)    a. mixed ischemic/non-ischemic cardiomyopathy. b. s/p CRT-D in 06/2014.  Marland Kitchen CKD (chronic kidney disease), stage III   . Complication of anesthesia    hard time waking her up from general surgery  . Coronary artery disease    a. remote RCA stenting in 2008 with non-DES. b. Cath 06/2014 following abnormal nuc: Stable, unchanged from prior cath, patent stent and 40% LM.  . Diabetes mellitus ([place])   . GERD (gastroesophageal reflux disease)   . Headache   . Hyperlipidemia   . Hypertension   . LBBB (left bundle branch block)   . Nonischemic cardiomyopathy ([place])   . OSA (obstructive sleep apnea)    AHI-9.77/hr, during REM-50.32/hr  . Tobacco abuse     Past Surgical History:  Procedure Laterality Date  . BACK SURGERY  2013  . BI-VENTRICULAR IMPLANTABLE CARDIOVERTER DEFIBRILLATOR N/A 06/11/2014   STJ CRTD implanted by Dr Caryl Comes  . CARDIAC CATHETERIZATION  12/14/2006   RCA stented with a 3.0 [employer] Liberte stent resulting in a reduction of 75% to 0% residual  . CHOLECYSTECTOMY     20 years ago  . COLONOSCOPY WITH PROPOFOL N/A 12/15/2014   Procedure: COLONOSCOPY WITH PROPOFOL;  Surgeon: Clarene Essex, MD;  Location: [facility] ENDOSCOPY;  Service: Endoscopy;  Laterality: N/A;  . EYE SURGERY     bilateral cataract surgery   . LEFT AND RIGHT HEART CATHETERIZATION WITH CORONARY ANGIOGRAM N/A 05/29/2014   Procedure: LEFT AND RIGHT HEART CATHETERIZATION WITH CORONARY ANGIOGRAM;  Surgeon: Lorretta Harp, MD;  [facility];  Service: Cardiovascular;  Laterality: N/A;  . LEFT HEART CATHETERIZATION WITH CORONARY ANGIOGRAM N/A 12/27/2012    Procedure: LEFT HEART CATHETERIZATION WITH CORONARY ANGIOGRAM;  Surgeon: Lorretta Harp, MD;  Location: [facility] CATH LAB;  Service: Cardiovascular;  Laterality: N/A;    Social History   Tobacco Use  . Smoking status: Current Every Day Smoker    Packs/day: 2.00    Years: 58.00    Pack years: 116.00    Types: Cigarettes    Start date: 12/26/1952    Last attempt to quit: 12/05/2013  Years since quitting: 5.4  . Smokeless tobacco: Never Used  . Tobacco comment: uses Vape cigarettes  Substance Use Topics  . Alcohol use: No    Alcohol/week: 0.0 standard drinks    Family History  Problem Relation Age of Onset  . Stroke Mother   . Hypertension Mother   . Coronary artery disease Father   . Stroke Brother   . Heart disease Brother   . Other Brother        H1N1 VIRUS  . Healthy Sister   . Healthy Sister     ROS Per hpi  OBJECTIVE:  Today's Vitals   05/16/19 1624  BP: 128/75  Pulse: 98  Temp: 98.4 F (36.9 C)  SpO2: 97%  Weight: 146 lb (66.2 kg)  Height: _0  (1.651 m)   Body mass index is 24.3 kg/m.   Physical Exam  Gen: AAOx3, NAD Skin: SK on back, not actively bleeding  No results found for this or any previous visit (from the past 24 hour(s)).  No results found.   ASSESSMENT and PLAN  1. Seborrheic keratosis Reassured  2. Insulin dependent type 2 diabetes mellitus ([place]) New glucometer given. Cont current regime of levemir, hold of trulicity for now  Other orders - blood glucose meter kit and supplies KIT; Dispense based on patient and insurance preference. Use three times a day as directed. Dx. E11.65, Z79.4  No follow-ups on file.    Rutherford Guys, MD Primary Care at [facility] [place], [place] [address] Ph.  [phone] Fax [phone]

## 2019-05-17 ENCOUNTER — Telehealth: Payer: Self-pay | Admitting: Cardiovascular Disease

## 2019-05-17 DIAGNOSIS — R69 Illness, unspecified: Secondary | ICD-10-CM | POA: Diagnosis not present

## 2019-05-17 NOTE — Telephone Encounter (Signed)
Spoke with patient regarding appointment for CTA neck scheduled Thursday 05/30/19 at 12:00pm---arrival time is 11:40 am at [facility], [address] ([place])

## 2019-05-17 NOTE — Addendum Note (Signed)
Addended by: Cain Sieve on: 05/17/2019 10:21 AM   Modules accepted: Orders

## 2019-05-20 ENCOUNTER — Telehealth: Payer: Self-pay | Admitting: Family Medicine

## 2019-05-20 NOTE — Telephone Encounter (Signed)
Requesting a refill for this medication. I don't see it listed on her medication profile nor on her LOV notes.

## 2019-05-20 NOTE — Telephone Encounter (Signed)
Medication Refill - Medication: ipratropium bromide nasal soluton  Has the patient contacted their pharmacy? Yes.   (Agent: If no, request that the patient contact the pharmacy for the refill.) (Agent: If yes, when and what did the pharmacy advise?)  Preferred Pharmacy (with phone number or street name):  [facility], [place] [address]  Phone: [phone] Fax: [phone]  Not a 24 hour pharmacy; exact hours not known.     Agent: Please be advised that RX refills may take up to 3 business days. We ask that you follow-up with your pharmacy.

## 2019-05-21 ENCOUNTER — Other Ambulatory Visit: Payer: Self-pay | Admitting: *Deleted

## 2019-05-21 MED ORDER — POTASSIUM CHLORIDE CRYS ER 20 MEQ PO TBCR: EXTENDED_RELEASE_TABLET | ORAL | 0 refills | Status: DC

## 2019-05-22 NOTE — Telephone Encounter (Signed)
Please Advise

## 2019-05-23 MED ORDER — IPRATROPIUM BROMIDE 0.03 % NA SOLN: 2.0000 | Freq: Two times a day (BID) | NASAL | 3 refills | Status: DC

## 2019-05-28 ENCOUNTER — Telehealth: Payer: Self-pay | Admitting: Cardiovascular Disease

## 2019-05-28 DIAGNOSIS — R69 Illness, unspecified: Secondary | ICD-10-CM | POA: Diagnosis not present

## 2019-05-28 NOTE — Telephone Encounter (Signed)
Returned call to patient and reviewed remote transmission which suggests possible fluid accumulation starting 3/22.  Patient reports appetite has increased lately and she is eating more.   She reports the following weight: 136-139 lbs 2 weeks ago 144.2 lbs today  She has some abdominal bloating.  Discussed diet and patient is not adhering to low salt diet.  Last night she had smoked sausage and did use salt shaker on her meal.  Reviewed online nutrition for smoked sausage she ate and advised it has 530 mg per serving and there are 7 servings per container.  She reports eating about half of the sausage which would equal approximately 1500-2000 mg of salt.  Advised one shake of the salt shaker is 250 mg.    Answered question about Furosemide and Potassium dosages. Advised she should be taking Furosemide 40 mg 2 tablets every AM and 1 tab PM ON Monday, Wed and Friday. Potassium 20 mEq take 1 tab by mouth twice a day ON Monday, Wednesday AND Friday (days you take Lasix).    She was not sure why Lasix was decreased at 3/1 OV and explained the office notes say Lasix was decreased due to Orthostatic hypotension.  Recommendations: Encouraged her to review food labels and limit salt intake to 2000 mg daily.  She said her BP is been a little higher lately and advised this could be related to eating dietary salt intake.  She will continue to track BP and weight.  Will route to Dr Gwenlyn Found for review and will call her back if any recommendations.

## 2019-05-28 NOTE — Telephone Encounter (Signed)
Patient states she is requesting to speak with [name] in regards to medication changes. Please call to discuss.

## 2019-05-28 NOTE — Telephone Encounter (Signed)
Spoke with patient.  She asked if there was an updated remote transmission that could be reviewed because she is gaining weight along with stomach bloating.  She reports Furosemide and Potassium was decreased at last office visit but she is not really sure of the correct dosages.  She feels like when she is dehydrated she loses her voice.    Advised to send remote transmission for review and I will call her back about the report and medication question.

## 2019-05-30 ENCOUNTER — Ambulatory Visit
Admission: RE | Admit: 2019-05-30 | Discharge: 2019-05-30 | Disposition: A | Discharge status: Home / Self Care | Payer: Medicare HMO | Source: Ambulatory Visit | Attending: Cardiovascular Disease | Admitting: Cardiovascular Disease

## 2019-05-30 DIAGNOSIS — R0989 Other specified symptoms and signs involving the circulatory and respiratory systems: Secondary | ICD-10-CM

## 2019-05-30 DIAGNOSIS — Z720 Tobacco use: Secondary | ICD-10-CM

## 2019-05-30 DIAGNOSIS — E785 Hyperlipidemia, unspecified: Secondary | ICD-10-CM

## 2019-05-30 DIAGNOSIS — Z95810 Presence of automatic (implantable) cardiac defibrillator: Secondary | ICD-10-CM

## 2019-05-30 DIAGNOSIS — I2510 Atherosclerotic heart disease of native coronary artery without angina pectoris: Secondary | ICD-10-CM

## 2019-05-30 DIAGNOSIS — I10 Essential (primary) hypertension: Secondary | ICD-10-CM

## 2019-05-30 DIAGNOSIS — I6522 Occlusion and stenosis of left carotid artery: Secondary | ICD-10-CM | POA: Diagnosis not present

## 2019-05-30 MED ORDER — IOPAMIDOL (ISOVUE-370) INJECTION 76%
60.0000 mL | Freq: Once | INTRAVENOUS | Status: AC | PRN
  Administered 2019-05-30: 60 mL via INTRAVENOUS

## 2019-06-03 ENCOUNTER — Other Ambulatory Visit: Payer: Self-pay

## 2019-06-03 ENCOUNTER — Ambulatory Visit (INDEPENDENT_AMBULATORY_CARE_PROVIDER_SITE_OTHER): Payer: Medicare HMO

## 2019-06-03 ENCOUNTER — Telehealth (INDEPENDENT_AMBULATORY_CARE_PROVIDER_SITE_OTHER): Payer: Medicare HMO | Admitting: Family Medicine

## 2019-06-03 ENCOUNTER — Encounter: Payer: Self-pay | Admitting: Family Medicine

## 2019-06-03 DIAGNOSIS — Z95810 Presence of automatic (implantable) cardiac defibrillator: Secondary | ICD-10-CM

## 2019-06-03 DIAGNOSIS — I5022 Chronic systolic (congestive) heart failure: Secondary | ICD-10-CM

## 2019-06-03 DIAGNOSIS — R490 Dysphonia: Secondary | ICD-10-CM

## 2019-06-03 NOTE — Progress Notes (Signed)
Virtual Visit Note  I connected with patient on 06/03/19 at 143pm by phone per patient's preference and verified that I am speaking with the correct person using two identifiers. [name] is currently located at home and patient is currently with them during visit. The provider, Rutherford Guys, MD is located in their office at time of visit.  I discussed the limitations, risks, security and privacy concerns of performing an evaluation and management service by telephone and the availability of in person appointments. I also discussed with the patient that there may be a patient responsible charge related to this service. The patient expressed understanding and agreed to proceed.   I provided 7 minutes of non-face-to-face time during this encounter.  Chief Complaint  Patient presents with  . Hoarse    went to ent 2 wks and she was told there was reason shown for hoarseness. Did have imaging done on the throat 49yr ago, only showed scar tissue. Lost her voice last night. Drank coffee and used a mouth wash. Only suppose to drink 64oz a day, thinks she has been going over    HPI ? Hoarseness since last night saw ENT 2 weeks ago - note reviewed in epic, no recurrence of cancer, overall normal laryngoscopy, swallow study pending Patient states that her voice comes and goes Denies any fever, chills, sore throat, pain with swallowing Has minimal nasal congestion   Allergies  Allergen Reactions  . Valium [Diazepam] Swelling    face    Prior to Admission medications   Medication Sig Start Date End Date Taking? Authorizing Provider  ALPRAZolam (Duanne Moron 1 MG tablet Take 1 mg by mouth daily as needed. 04/25/18  Yes [provider]  aspirin EC 81 MG tablet Take 81 mg by mouth at bedtime.   Yes [provider]  blood glucose meter kit and supplies KIT Dispense based on patient and insurance preference. Use three times a day as directed. Dx. E11.65, Z79.4 05/16/19  Yes  SRutherford Guys MD  carvedilol (COREG) 12.5 MG tablet TAKE 1 1/2 TABLET BY MOUTH TWICE A DAY 03/13/19  Yes KDeboraha Sprang MD  citalopram (CELEXA) 20 MG tablet Take 20 mg by mouth daily. 12/08/18  Yes [provider]  clopidogrel (PLAVIX) 75 MG tablet Take 1 tablet by mouth once daily 02/15/19  Yes BLorretta Harp MD  furosemide (LASIX) 40 MG tablet ON Monday Wednesday AND Friday TAKE 2 TABS BY MOUTH IN A.M AND TAKE 1 TAB AT P.M. 05/06/19  Yes Kilroy, Luke K, PA-C  insulin aspart (NOVOLOG FLEXPEN) 100 UNIT/ML FlexPen Per insulin sliding scale, max TTD, 20 units. 06/26/17  Yes SRutherford Guys MD  Insulin Detemir (LEVEMIR) 100 UNIT/ML Pen Inject 16 Units into the skin daily at 10 pm. 09/04/18  Yes SRutherford Guys MD  Insulin Pen Needle (BD PEN NEEDLE MICRO U/F) 32G X 6 MM MISC USE NEW NEEDLE FOR EACH INJECTION OF INSULIN, FOUR TIMES DAILY 04/30/19  Yes SRutherford Guys MD  ipratropium (ATROVENT) 0.03 % nasal spray Place 2 sprays into both nostrils 2 (two) times daily. 05/23/19  Yes SRutherford Guys MD  levothyroxine (SYNTHROID) 88 MCG tablet TAKE 1 TABLET BY MOUTH AT BEDTIME 03/13/19  Yes SRutherford Guys MD  mirtazapine (REMERON) 30 MG tablet Take 1 tablet (30 mg total) by mouth at bedtime. 12/27/18  Yes SRutherford Guys MD  Moringa Oleifera (MORINGA PO) Take 5,000 mg by mouth.   Yes [provider]  nitroGLYCERIN (NITROSTAT) 0.4 MG SL tablet Place 1 tablet (0.4 mg total) under the tongue every 5 (five) minutes as needed for chest pain. 05/06/19  Yes Kilroy, Luke K, PA-C  ONE TOUCH ULTRA TEST test strip  06/07/17  Yes [provider]  Jonetta Speak LANCETS 21J [place]  06/07/17  Yes [provider]  oxyCODONE-acetaminophen (PERCOCET/ROXICET) 5-325 MG tablet Take by mouth every 4 (four) hours as needed for severe pain.   Yes [provider]  potassium chloride SA (KLOR-CON) 20 MEQ tablet TAKE 1 TAB BY MOUTH 2 TIMES A DAY ON Monday, Wednesday AND Friday  (days  you take Lasix) 05/21/19  Yes Kilroy, Luke K, PA-C  simvastatin (ZOCOR) 20 MG tablet TAKE 1 TABLET BY MOUTH ONCE DAILY WITH SUPPER 06/25/18  Yes Rutherford Guys, MD  triamcinolone cream (KENALOG) 0.1 % APPLY ONE APPLICATION TOPICALLY TWO TIMES DAILY 11/28/18  Yes Rutherford Guys, MD    Past Medical History:  Diagnosis Date  . Anxiety   . Arthritis   . Chronic pain    a. Prior h/o chronic pain on methadone.  . Chronic systolic CHF (congestive heart failure) (HCC)    a. mixed ischemic/non-ischemic cardiomyopathy. b. s/p CRT-D in 06/2014.  Marland Kitchen CKD (chronic kidney disease), stage III   . Complication of anesthesia    hard time waking her up from general surgery  . Coronary artery disease    a. remote RCA stenting in 2008 with non-DES. b. Cath 06/2014 following abnormal nuc: Stable, unchanged from prior cath, patent stent and 40% LM.  . Diabetes mellitus ([place])   . GERD (gastroesophageal reflux disease)   . Headache   . Hyperlipidemia   . Hypertension   . LBBB (left bundle branch block)   . Nonischemic cardiomyopathy ([place])   . OSA (obstructive sleep apnea)    AHI-9.77/hr, during REM-50.32/hr  . Tobacco abuse     Past Surgical History:  Procedure Laterality Date  . BACK SURGERY  2013  . BI-VENTRICULAR IMPLANTABLE CARDIOVERTER DEFIBRILLATOR N/A 06/11/2014   STJ CRTD implanted by Dr Caryl Comes  . CARDIAC CATHETERIZATION  12/14/2006   RCA stented with a 3.0 [employer] Liberte stent resulting in a reduction of 75% to 0% residual  . CHOLECYSTECTOMY     20 years ago  . COLONOSCOPY WITH PROPOFOL N/A 12/15/2014   Procedure: COLONOSCOPY WITH PROPOFOL;  Surgeon: Clarene Essex, MD;  Location: [facility];  Service: Endoscopy;  Laterality: N/A;  . EYE SURGERY     bilateral cataract surgery   . LEFT AND RIGHT HEART CATHETERIZATION WITH CORONARY ANGIOGRAM N/A 05/29/2014   Procedure: LEFT AND RIGHT HEART CATHETERIZATION WITH CORONARY ANGIOGRAM;  Surgeon: Lorretta Harp, MD;  [facility] CATH LAB;   Service: Cardiovascular;  Laterality: N/A;  . LEFT HEART CATHETERIZATION WITH CORONARY ANGIOGRAM N/A 12/27/2012   Procedure: LEFT HEART CATHETERIZATION WITH CORONARY ANGIOGRAM;  Surgeon: Lorretta Harp, MD;  Location: [facility] CATH LAB;  Service: Cardiovascular;  Laterality: N/A;    Social History   Tobacco Use  . Smoking status: Current Every Day Smoker    Packs/day: 2.00    Years: 58.00    Pack years: 116.00    Types: Cigarettes    Start date: 12/26/1952    Last attempt to quit: 12/05/2013    Years since quitting: 5.4  . Smokeless tobacco: Never Used  . Tobacco comment: uses Vape cigarettes  Substance Use Topics  . Alcohol use: No    Alcohol/week: 0.0 standard drinks  Family History  Problem Relation Age of Onset  . Stroke Mother   . Hypertension Mother   . Coronary artery disease Father   . Stroke Brother   . Heart disease Brother   . Other Brother        H1N1 VIRUS  . Healthy Sister   . Healthy Sister     ROS Per hpi  Objective  Vitals as reported by the patient: none   ASSESSMENT and PLAN  1. Hoarseness, chronic Recent reassuring ENT eval. Discussed vocal rest and supportive measures. RTC precautions reviewed  FOLLOW-UP: prn   The above assessment and management plan was discussed with the patient. The patient verbalized understanding of and has agreed to the management plan. Patient is aware to call the clinic if symptoms persist or worsen. Patient is aware when to return to the clinic for a follow-up visit. Patient educated on when it is appropriate to go to the emergency department.     Rutherford Guys, MD Primary Care at [place], [place] [address] Ph.  [phone] Fax [phone]

## 2019-06-04 ENCOUNTER — Other Ambulatory Visit: Payer: Self-pay

## 2019-06-04 ENCOUNTER — Ambulatory Visit (INDEPENDENT_AMBULATORY_CARE_PROVIDER_SITE_OTHER): Payer: Medicare HMO | Admitting: Family Medicine

## 2019-06-04 ENCOUNTER — Ambulatory Visit (INDEPENDENT_AMBULATORY_CARE_PROVIDER_SITE_OTHER): Payer: Medicare HMO

## 2019-06-04 ENCOUNTER — Encounter: Payer: Self-pay | Admitting: Family Medicine

## 2019-06-04 VITALS — BP 136/71 | HR 86 | Temp 97.2°F | Ht 65.0 in | Wt 147.0 lb

## 2019-06-04 DIAGNOSIS — J22 Unspecified acute lower respiratory infection: Secondary | ICD-10-CM | POA: Diagnosis not present

## 2019-06-04 DIAGNOSIS — R05 Cough: Secondary | ICD-10-CM | POA: Diagnosis not present

## 2019-06-04 DIAGNOSIS — M546 Pain in thoracic spine: Secondary | ICD-10-CM

## 2019-06-04 DIAGNOSIS — R911 Solitary pulmonary nodule: Secondary | ICD-10-CM

## 2019-06-04 MED ORDER — AZITHROMYCIN 250 MG PO TABS: ORAL_TABLET | ORAL | 0 refills | Status: AC

## 2019-06-04 NOTE — Patient Instructions (Signed)
° ° ° °  If you have lab work done today you will be contacted with your lab results within the next 2 weeks.  If you have not heard from us then please contact us. The fastest way to get your results is to register for My Chart. ° ° °IF you received an x-ray today, you will receive an invoice from [facility]. Please contact [facility] at [phone] with questions or concerns regarding your invoice.  ° °IF you received labwork today, you will receive an invoice from [facility]. Please contact [facility] at [phone] with questions or concerns regarding your invoice.  ° °Our billing staff will not be able to assist you with questions regarding bills from these companies. ° °You will be contacted with the lab results as soon as they are available. The fastest way to get your results is to activate your My Chart account. Instructions are located on the last page of this paperwork. If you have not heard from us regarding the results in 2 weeks, please contact this office. °  ° ° ° °

## 2019-06-04 NOTE — Progress Notes (Signed)
3/30/20212:07 PM  Aniyha M Dayley 02/23/1941, 79 y.o., female [id number]  Chief Complaint  Patient presents with  . Back Pain    7/10 made worse with activity  . recent CT scan results    L lung nodule  . coughs up yellow to clear    HPI:   Patient is a 79 y.o. female with past medical history significant for  DM2 on insulin, osteomyelitis, CKD 3, CAD, CHF, HTN, HLP, OSA not on cpap, depression and anxiety  who presents today with several concerns  Had CTA neck ordered by cards 5 days ago which found a new LUL lung nodule 8x41m, recommends non contrast chest CT in 6-12 months Smoker  Has been having increased coughing, increased sputum, changing color to yellowish-greenish, no SOB, no fever or chills, no chest pain, no edema Does not use inhalers  Having mid back pain with numbness that started several weeks ago, at times very intense Not aggravated by movements, deep breathing Has known DDD of thoracic spine   Depression screen PMs [facility]2/9 06/04/2019 04/15/2019 04/11/2019  Decreased Interest 0 3 0  Down, Depressed, Hopeless 0 3 0  PHQ - 2 Score 0 6 0  Altered sleeping 0 3 -  Tired, decreased energy 0 3 -  Change in appetite 0 3 -  Feeling bad or failure about yourself  - - -  Trouble concentrating - - -  Moving slowly or fidgety/restless - - -  Suicidal thoughts - - -  PHQ-9 Score 0 15 -  Difficult doing work/chores - - -  Some recent data might be hidden    Fall Risk  06/04/2019 04/11/2019 03/04/2019 12/13/2018 10/29/2018  Falls in the past year? 0 0 0 0 1  Number falls in past yr: 0 0 0 0 1  Injury with Fall? 0 0 0 0 1  Risk for fall due to : - - Impaired mobility - -  Follow up Falls evaluation completed Falls evaluation completed;Education provided - - Falls evaluation completed     Allergies  Allergen Reactions  . Valium [Diazepam] Swelling    face    Prior to Admission medications   Medication Sig Start Date End Date Taking? Authorizing Provider  ALPRAZolam  (Duanne Moron 1 MG tablet Take 1 mg by mouth daily as needed. 04/25/18  Yes [provider]  Ascorbic Acid (VITAMIN C) 100 MG tablet Take 100 mg by mouth daily.   Yes [provider]  aspirin EC 81 MG tablet Take 81 mg by mouth at bedtime.   Yes [provider]  blood glucose meter kit and supplies KIT Dispense based on patient and insurance preference. Use three times a day as directed. Dx. E11.65, Z79.4 05/16/19  Yes SRutherford Guys MD  carvedilol (COREG) 12.5 MG tablet TAKE 1 1/2 TABLET BY MOUTH TWICE A DAY 03/13/19  Yes KDeboraha Sprang MD  citalopram (CELEXA) 20 MG tablet Take 20 mg by mouth daily. 12/08/18  Yes [provider]  clopidogrel (PLAVIX) 75 MG tablet Take 1 tablet by mouth once daily 02/15/19  Yes BLorretta Harp MD  furosemide (LASIX) 40 MG tablet ON Monday Wednesday AND Friday TAKE 2 TABS BY MOUTH IN A.M AND TAKE 1 TAB AT P.M. 05/06/19  Yes Kilroy, Luke K, PA-C  insulin aspart (NOVOLOG FLEXPEN) 100 UNIT/ML FlexPen Per insulin sliding scale, max TTD, 20 units. 06/26/17  Yes SRutherford Guys MD  Insulin Detemir (LEVEMIR) 100 UNIT/ML Pen Inject 16 Units into the skin  daily at 10 pm. 09/04/18  Yes Rutherford Guys, MD  Insulin Pen Needle (BD PEN NEEDLE MICRO U/F) 32G X 6 MM MISC USE NEW NEEDLE FOR EACH INJECTION OF INSULIN, FOUR TIMES DAILY 04/30/19  Yes Rutherford Guys, MD  ipratropium (ATROVENT) 0.03 % nasal spray Place 2 sprays into both nostrils 2 (two) times daily. 05/23/19  Yes Rutherford Guys, MD  levothyroxine (SYNTHROID) 88 MCG tablet TAKE 1 TABLET BY MOUTH AT BEDTIME 03/13/19  Yes Rutherford Guys, MD  mirtazapine (REMERON) 30 MG tablet Take 1 tablet (30 mg total) by mouth at bedtime. 12/27/18  Yes Rutherford Guys, MD  Moringa Oleifera (MORINGA PO) Take 5,000 mg by mouth.   Yes [provider]  nitroGLYCERIN (NITROSTAT) 0.4 MG SL tablet Place 1 tablet (0.4 mg total) under the tongue every 5 (five) minutes as needed for chest pain. 05/06/19  Yes  Kilroy, Luke K, PA-C  ONE TOUCH ULTRA TEST test strip  06/07/17  Yes [provider]  Jonetta Speak LANCETS 99J [place]  06/07/17  Yes [provider]  oxyCODONE-acetaminophen (PERCOCET/ROXICET) 5-325 MG tablet Take by mouth every 4 (four) hours as needed for severe pain.   Yes [provider]  potassium chloride SA (KLOR-CON) 20 MEQ tablet TAKE 1 TAB BY MOUTH 2 TIMES A DAY ON Monday, Wednesday AND Friday  (days you take Lasix) 05/21/19  Yes Kilroy, Luke K, PA-C  simvastatin (ZOCOR) 20 MG tablet TAKE 1 TABLET BY MOUTH ONCE DAILY WITH SUPPER 06/25/18  Yes Rutherford Guys, MD  triamcinolone cream (KENALOG) 0.1 % APPLY ONE APPLICATION TOPICALLY TWO TIMES DAILY 11/28/18  Yes Rutherford Guys, MD    Past Medical History:  Diagnosis Date  . Anxiety   . Arthritis   . Chronic pain    a. Prior h/o chronic pain on methadone.  . Chronic systolic CHF (congestive heart failure) (HCC)    a. mixed ischemic/non-ischemic cardiomyopathy. b. s/p CRT-D in 06/2014.  Marland Kitchen CKD (chronic kidney disease), stage III   . Complication of anesthesia    hard time waking her up from general surgery  . Coronary artery disease    a. remote RCA stenting in 2008 with non-DES. b. Cath 06/2014 following abnormal nuc: Stable, unchanged from prior cath, patent stent and 40% LM.  . Diabetes mellitus ([place])   . GERD (gastroesophageal reflux disease)   . Headache   . Hyperlipidemia   . Hypertension   . LBBB (left bundle branch block)   . Nonischemic cardiomyopathy ([place])   . OSA (obstructive sleep apnea)    AHI-9.77/hr, during REM-50.32/hr  . Tobacco abuse     Past Surgical History:  Procedure Laterality Date  . BACK SURGERY  2013  . BI-VENTRICULAR IMPLANTABLE CARDIOVERTER DEFIBRILLATOR N/A 06/11/2014   STJ CRTD implanted by Dr Caryl Comes  . CARDIAC CATHETERIZATION  12/14/2006   RCA stented with a 3.0 [employer] Liberte stent resulting in a reduction of 75% to 0% residual  . CHOLECYSTECTOMY     20 years  ago  . COLONOSCOPY WITH PROPOFOL N/A 12/15/2014   Procedure: COLONOSCOPY WITH PROPOFOL;  Surgeon: Clarene Essex, MD;  Location: [facility];  Service: Endoscopy;  Laterality: N/A;  . EYE SURGERY     bilateral cataract surgery   . LEFT AND RIGHT HEART CATHETERIZATION WITH CORONARY ANGIOGRAM N/A 05/29/2014   Procedure: LEFT AND RIGHT HEART CATHETERIZATION WITH CORONARY ANGIOGRAM;  Surgeon: Lorretta Harp, MD;  Location[facility] CATH LAB;  Service: Cardiovascular;  Laterality: N/A;  .  LEFT HEART CATHETERIZATION WITH CORONARY ANGIOGRAM N/A 12/27/2012   Procedure: LEFT HEART CATHETERIZATION WITH CORONARY ANGIOGRAM;  Surgeon: Lorretta Harp, MD;  Location: [facility] CATH LAB;  Service: Cardiovascular;  Laterality: N/A;    Social History   Tobacco Use  . Smoking status: Current Every Day Smoker    Packs/day: 2.00    Years: 58.00    Pack years: 116.00    Types: Cigarettes    Start date: 12/26/1952    Last attempt to quit: 12/05/2013    Years since quitting: 5.4  . Smokeless tobacco: Never Used  . Tobacco comment: uses Vape cigarettes  Substance Use Topics  . Alcohol use: No    Alcohol/week: 0.0 standard drinks    Family History  Problem Relation Age of Onset  . Stroke Mother   . Hypertension Mother   . Coronary artery disease Father   . Stroke Brother   . Heart disease Brother   . Other Brother        H1N1 VIRUS  . Healthy Sister   . Healthy Sister     ROS Per hpi  OBJECTIVE:  Today's Vitals   06/04/19 1350  BP: 136/71  Pulse: 86  Temp: (!) 97.2 F (36.2 C)  SpO2: 96%  Weight: 147 lb (66.7 kg)  Height: '5\' 5"'  (1.651 m)   Body mass index is 24.46 kg/m.   Physical Exam Vitals and nursing note reviewed.  Constitutional:      Appearance: She is well-developed.  HENT:     Head: Normocephalic and atraumatic.     Mouth/Throat:     Pharynx: No oropharyngeal exudate.  Eyes:     General: No scleral icterus.    Conjunctiva/sclera: Conjunctivae normal.     Pupils: Pupils are  equal, round, and reactive to light.  Cardiovascular:     Rate and Rhythm: Normal rate and regular rhythm.     Heart sounds: Normal heart sounds. No murmur. No friction rub. No gallop.   Pulmonary:     Effort: Pulmonary effort is normal.     Breath sounds: Rhonchi and rales present. No wheezing.  Musculoskeletal:     Cervical back: Neck supple.     Thoracic back: Spasms and bony tenderness present. No tenderness.  Skin:    General: Skin is warm and dry.  Neurological:     Mental Status: She is alert and oriented to person, place, and time.     No results found for this or any previous visit (from the past 24 hour(s)).  DG Chest 2 View  Result Date: 06/04/2019 CLINICAL DATA:  Productive cough. EXAM: CHEST - 2 VIEW COMPARISON:  Jul 21, 2017 FINDINGS: There is a biventricular pacemaker/ICD. The positioning is stable from prior study. There is no pneumothorax. No focal infiltrate. No significant pleural effusion. The heart size is stable. Aortic calcifications are noted. There is no acute osseous abnormality. IMPRESSION: No active cardiopulmonary disease. Electronically Signed   By: Constance Holster M.D.   On: 06/04/2019 14:58   DG Thoracic Spine 2 View  Result Date: 06/04/2019 CLINICAL DATA:  New midline thoracic pain. Acute thoracic back pain. Smoker. EXAM: THORACIC SPINE 2 VIEWS COMPARISON:  Thoracic spine radiograph 07/21/2017. FINDINGS: Eleven pairs of ribs are again identified. Vertebral body heights are preserved. No acute or compression fracture. Normal alignment. Mild endplate spurring with preservation of disc spaces. No evidence of focal bone lesion. Left-sided pacemaker partially included. No paravertebral soft tissue abnormality. IMPRESSION: Mild degenerative change of the thoracic spine without  acute abnormality. No significant change from radiographs 07/21/2017. Electronically Signed   By: Keith Rake M.D.   On: 06/04/2019 15:02     ASSESSMENT and PLAN  1. Lower  respiratory tract infection Discussed supportive measures, new meds r/se/b and RTC precautions. - DG Chest 2 View  2. Acute midline thoracic back pain - DG Thoracic Spine 2 View - DDD, cont with pain mgt  3. Incidental lung nodule, greater than or equal to 37m Repeat CT chest in 6-12 months as recommended   Other orders - azithromycin (ZITHROMAX) 250 MG tablet; Take 2 tablets (500 mg total) by mouth daily for 1 day, THEN 1 tablet (250 mg total) daily for 4 days.  Return if symptoms worsen or fail to improve.    IRutherford Guys MD [facility] [place] [id number].  [phone] [phone]

## 2019-06-05 NOTE — Progress Notes (Signed)
EPIC Encounter for ICM Monitoring  Patient Name: [name] is a 79 y.o. female Date: 06/05/2019 Primary Care Physican: Rutherford Guys, MD Primary Cardiologist:Berry Electrophysiologist: Vergie Living Pacing: >99% LastWeight:137.7lbs  Transmission reviewed.  Corvue thoracic impedance returned to normal.  Prescribed: Furosemide 40 mgon Monday Wednesday and Friday take 2 tabs by mouth in AM and take 1 tab at PM.  Potassium 20 mEq On Monday, Wednesday AND Friday TAKE 1 TAB BY MOUTH 2 TIMES A DAY.  Labs: 04/17/2019 Creatinine 1.22, BUN 16, Potassium 4.3, Sodium 143, GFR 43-49 12/27/2018 Creatinine 1.29, BUN 17, Potassium 4.8, Sodium 143, GFR 40-46 10/24/2018 Creatinine 1.13, BUN 13, Potassium 4.1, Sodium 141, GFR 47-55 08/20/2018 Creatinine 1.19, BUN 13, Potassium 3.8, Sodium 144, GFR 44-51 05/05/2018 Creatinine 1.36, BUN 19, Potassium 3.9, Sodium 146, GFR 38-44 A complete set of results can be found in Results Review.  Recommendations:None  Follow-up plan: ICM clinic phone appointment on4/02/2020. 91 day device clinic remote transmission 08/12/2019.   Copy of ICM check sent to [facility].  3 month ICM trend: 06/05/2019    1 Year ICM trend:       [name], RN 06/05/2019 4:47 PM

## 2019-06-12 ENCOUNTER — Other Ambulatory Visit: Payer: Self-pay | Admitting: Family Medicine

## 2019-06-12 NOTE — Telephone Encounter (Signed)
Last TSH 04/17/19 1.2.

## 2019-06-17 ENCOUNTER — Ambulatory Visit: Payer: Medicare HMO

## 2019-06-17 DIAGNOSIS — I5022 Chronic systolic (congestive) heart failure: Secondary | ICD-10-CM

## 2019-06-17 DIAGNOSIS — Z95810 Presence of automatic (implantable) cardiac defibrillator: Secondary | ICD-10-CM

## 2019-06-19 ENCOUNTER — Other Ambulatory Visit: Payer: Self-pay | Admitting: Family Medicine

## 2019-06-19 NOTE — Telephone Encounter (Signed)
Requested Prescriptions  Pending Prescriptions Disp Refills  . simvastatin (ZOCOR) 20 MG tablet [Pharmacy Med Name: Simvastatin 20 MG Oral Tablet] 90 tablet 3    Sig: TAKE 1 TABLET BY MOUTH ONCE DAILY WITH SUPPER     Cardiovascular:  Antilipid - Statins Failed - 06/19/2019  5:30 AM      Failed - LDL in normal range and within 360 days    LDL Chol Calc (NIH)  Date Value Ref Range Status  04/17/2019 61 0 - 99 mg/dL Final         Passed - Total Cholesterol in normal range and within 360 days    Cholesterol, Total  Date Value Ref Range Status  04/17/2019 146 100 - 199 mg/dL Final         Passed - HDL in normal range and within 360 days    HDL  Date Value Ref Range Status  04/17/2019 71 >39 mg/dL Final         Passed - Triglycerides in normal range and within 360 days    Triglycerides  Date Value Ref Range Status  04/17/2019 70 0 - 149 mg/dL Final         Passed - Patient is not pregnant      Passed - Valid encounter within last 12 months    Recent Outpatient Visits          2 weeks ago Lower respiratory tract infection   [facility], Lilia Argue, MD   2 weeks ago Hoarseness, chronic   [facility], Lilia Argue, MD   1 month ago Seborrheic keratosis   [facility], Lilia Argue, MD   1 month ago Foreign body (FB) in soft tissue   [facility], Lilia Argue, MD   2 months ago Type 2 diabetes mellitus with hyperglycemia, with long-term current use of insulin [facility])   [facility], Lilia Argue, MD      Future Appointments            In 1 month Rutherford Guys, MD Primary Care at [place], [facility]

## 2019-06-19 NOTE — Progress Notes (Signed)
EPIC Encounter for ICM Monitoring  Patient Name: [name] is a 79 y.o. female Date: 06/19/2019 Primary Care Physican: Rutherford Guys, MD Primary Cardiologist:Berry Electrophysiologist: Vergie Living Pacing: >99% 4/14/2021Weight:146 lbs  Spoke with patient.  She reports weight gain of >3lbs and stomach bloating within the last week.  She thinks she needs to take Furosemide and Potassium daily.   Corvue thoracic impedance normal.  Prescribed:  Furosemide 40 mgonMonday Wednesday andFriday take 2 tabs by mouth in AM and take 1 tab at PM.  Potassium 20 mEqOnMonday, Wednesday AND Friday TAKE 1 TAB BY MOUTH 2 TIMES A DAY.  Labs: 04/17/2019 Creatinine 1.22, BUN 16, Potassium 4.3, Sodium 143, GFR 43-49 12/27/2018 Creatinine 1.29, BUN 17, Potassium 4.8, Sodium 143, GFR 40-46 10/24/2018 Creatinine 1.13, BUN 13, Potassium 4.1, Sodium 141, GFR 47-55 08/20/2018 Creatinine 1.19, BUN 13, Potassium 3.8, Sodium 144, GFR 44-51 05/05/2018 Creatinine 1.36, BUN 19, Potassium 3.9, Sodium 146, GFR 38-44 A complete set of results can be found in Results Review.  Recommendations:She is going to call Dr Kennon Holter office to discuss the Furosemide and Potassium dosage because she feels symptomatic.  Follow-up plan: ICM clinic phone appointment on5/17/2021. 91 day device clinic remote transmission6/09/2019.   Copy of ICM check sent to [facility].  3 month ICM trend: 06/19/2019    1 Year ICM trend:       [name], RN 06/19/2019 8:51 AM

## 2019-06-26 ENCOUNTER — Ambulatory Visit (INDEPENDENT_AMBULATORY_CARE_PROVIDER_SITE_OTHER): Payer: Medicare HMO

## 2019-06-26 DIAGNOSIS — I5022 Chronic systolic (congestive) heart failure: Secondary | ICD-10-CM | POA: Diagnosis not present

## 2019-06-26 DIAGNOSIS — Z95810 Presence of automatic (implantable) cardiac defibrillator: Secondary | ICD-10-CM | POA: Diagnosis not present

## 2019-06-28 ENCOUNTER — Telehealth: Payer: Self-pay | Admitting: Cardiovascular Disease

## 2019-06-28 ENCOUNTER — Telehealth: Payer: Self-pay

## 2019-06-28 DIAGNOSIS — I5022 Chronic systolic (congestive) heart failure: Secondary | ICD-10-CM

## 2019-06-28 NOTE — Telephone Encounter (Addendum)
Pt c/o swelling: STAT is pt has developed SOB within 24 hours  1) How much weight have you gained and in what time span? About 4 lbs in one day  2) If swelling, where is the swelling located? Ankles and legs  3) Are you currently taking a fluid pill? yes  4) Are you currently SOB? no  5) Do you have a log of your daily weights (if so, list)? yesterday 149, today 150.3  6) Have you gained 3 pounds in a day or 5 pounds in a week? Yes almost 4 lbs in one day  7) Have you traveled recently? no  Patient states she spoke with Sharman Cheek today and was told she needs to speak with a nurse today. She states her fluid pills were cut back, but she was having bad swelling in her ankles and legs so she increased it back Wednesday and it helped. She states she believes she gained 4 lbs in one day.

## 2019-06-28 NOTE — Telephone Encounter (Signed)
Spoke to patient Dr.Berry advised to schedule appointment with extender in 2 weeks to assess fluid status.Appointment scheduled with Odie Sera NP 5/10 at 2:15 pm.

## 2019-06-28 NOTE — Progress Notes (Signed)
Patient was contacted by Dr Kennon Holter nurse for recommendation and patient will schedule an appt in office with extender.  Remote Transmission scheduled for 07/02/2019 to recheck fluid levels.

## 2019-06-28 NOTE — Progress Notes (Signed)
EPIC Encounter for ICM Monitoring  Patient Name: [name] is a 79 y.o. female Date: 06/28/2019 Primary Care Physican: Rutherford Guys, MD Primary Cardiologist:Berry Electrophysiologist: Vergie Living Pacing: >99% 4/14/2021Weight:146 lbs  Spoke with patient and complains of leg swelling.   She's had restaurant food take out a few times since 06/19/2019.  She thinks she is watching salt and fluid intake but not measuring liquids or totaling salt intake for the day.  She did not contact Dr Kennon Holter office on 4/14 as she said to discuss Furosemide dosage.  She thinks she needs to be taking Furosemide more frequently or a different dosage.  Corvue thoracic impedance suggesting possible fluid accumulation since 06/19/2019.  Prescribed:  Furosemide 40 mgonMonday Wednesday andFriday take 2 tabs by mouth in AM and take 1 tab at PM.  Potassium 20 mEqOnMonday, Wednesday AND Friday TAKE 1 TAB BY MOUTH 2 TIMES A DAY.  Labs: 04/17/2019 Creatinine 1.22, BUN 16, Potassium 4.3, Sodium 143, GFR 43-49 12/27/2018 Creatinine 1.29, BUN 17, Potassium 4.8, Sodium 143, GFR 40-46 10/24/2018 Creatinine 1.13, BUN 13, Potassium 4.1, Sodium 141, GFR 47-55 08/20/2018 Creatinine 1.19, BUN 13, Potassium 3.8, Sodium 144, GFR 44-51 05/05/2018 Creatinine 1.36, BUN 19, Potassium 3.9, Sodium 146, GFR 38-44 A complete set of results can be found in Results Review.  Recommendations:Patient's plan is to phone Dr Kennon Holter office today to discuss the Furosemide and Potassium dosage.  Follow-up plan: ICM clinic phone appointment on5/17/2021. 91 day device clinic remote transmission6/09/2019.   Copy of ICM check sent to Dr.Klein and Dr Gwenlyn Found.  3 month ICM trend: 06/28/2019    1 Year ICM trend:       [name], RN 06/28/2019 12:38 PM

## 2019-06-28 NOTE — Telephone Encounter (Signed)
Spoke with pt, her lasix and k+ was decreased and recently her legs and feet have been swelling. She was contacted by the device clinic because her transmission showed and increase in her optival. Since Wednesday this week she has increased her furosemide back to 2 tablets in the am and 1 tablet in the pm. She reports the swelling in her feet and legs is better but her weight is still up. Her dry weight is 136 to 139 and she was 150.3 this morning. She also reports the device clinic called her and said her impedence was still elevated. She was advised to continue on the furosemide 2 tabs in the am and 1 tab in the pm and to take potassium once daily. She will call next week and let us know how she is doing and will come by the office for lab work. Pt agreed with this plan.

## 2019-06-28 NOTE — Progress Notes (Signed)
I would have her come in to see an APP in the next week or 2 to assess volume status and adjust diuretic dosing

## 2019-07-02 ENCOUNTER — Telehealth: Payer: Self-pay

## 2019-07-02 ENCOUNTER — Ambulatory Visit (INDEPENDENT_AMBULATORY_CARE_PROVIDER_SITE_OTHER): Payer: Medicare HMO

## 2019-07-02 DIAGNOSIS — I5022 Chronic systolic (congestive) heart failure: Secondary | ICD-10-CM

## 2019-07-02 DIAGNOSIS — Z95810 Presence of automatic (implantable) cardiac defibrillator: Secondary | ICD-10-CM

## 2019-07-02 NOTE — Telephone Encounter (Signed)
ICM call to patient.  She reports feeling better and will send in remote transmission for fluid level review tomorrow morning.

## 2019-07-03 DIAGNOSIS — I5022 Chronic systolic (congestive) heart failure: Secondary | ICD-10-CM | POA: Diagnosis not present

## 2019-07-03 NOTE — Progress Notes (Signed)
EPIC Encounter for ICM Monitoring  Patient Name: [name] is a 79 y.o. female Date: 07/03/2019 Primary Care Physican: Rutherford Guys, MD Primary Cardiologist:Berry Electrophysiologist: Vergie Living Pacing: >99% 4/14/2021Weight:146lbs  Spoke with patient and leg swelling has resolved.   Corvue thoracic impedancereturned to normal  Prescribed:  Furosemide 40 mgonMonday Wednesday andFriday take 2 tabs by mouth in AM and take 1 tab at PM.  Potassium 20 mEqOnMonday, Wednesday AND Friday TAKE 1 TAB BY MOUTH 2 TIMES A DAY.  Labs: 04/17/2019 Creatinine 1.22, BUN 16, Potassium 4.3, Sodium 143, GFR 43-49 12/27/2018 Creatinine 1.29, BUN 17, Potassium 4.8, Sodium 143, GFR 40-46 10/24/2018 Creatinine 1.13, BUN 13, Potassium 4.1, Sodium 141, GFR 47-55 08/20/2018 Creatinine 1.19, BUN 13, Potassium 3.8, Sodium 144, GFR 44-51 05/05/2018 Creatinine 1.36, BUN 19, Potassium 3.9, Sodium 146, GFR 38-44 A complete set of results can be found in Results Review.  Recommendations:Advised to take Furosemide and Potassium as prescribed and encouraged to call for any further fluid symptoms.  Follow-up plan: ICM clinic phone appointment on5/24/2021. 91 day device clinic remote transmission6/09/2019. Office visit with Coletta Memos, PA on 07/15/2019  Copy of ICM check sent to Dr.Klein and Dr Gwenlyn Found to show impedance returned to normal.  3 month ICM trend: 07/03/2019    1 Year ICM trend:       [name], RN 07/03/2019 1:51 PM

## 2019-07-04 ENCOUNTER — Other Ambulatory Visit (HOSPITAL_COMMUNITY): Payer: Self-pay | Admitting: Cardiovascular Disease

## 2019-07-04 DIAGNOSIS — I6523 Occlusion and stenosis of bilateral carotid arteries: Secondary | ICD-10-CM

## 2019-07-04 LAB — BASIC METABOLIC PANEL
BUN/Creatinine Ratio: 13 (ref 12–28)
BUN: 16 mg/dL (ref 8–27)
CO2: 24 mmol/L (ref 20–29)
Calcium: 9.4 mg/dL (ref 8.7–10.3)
Chloride: 105 mmol/L (ref 96–106)
Creatinine, Ser: 1.22 mg/dL — ABNORMAL HIGH (ref 0.57–1.00)
GFR calc Af Amer: 49 mL/min/{1.73_m2} — ABNORMAL LOW (ref >59)
GFR calc non Af Amer: 43 mL/min/{1.73_m2} — ABNORMAL LOW (ref >59)
Glucose: 93 mg/dL (ref 65–99)
Potassium: 4.1 mmol/L (ref 3.5–5.2)
Sodium: 144 mmol/L (ref 134–144)

## 2019-07-12 ENCOUNTER — Other Ambulatory Visit: Payer: Self-pay | Admitting: *Deleted

## 2019-07-12 ENCOUNTER — Ambulatory Visit: Payer: Medicare HMO | Admitting: General Practice

## 2019-07-12 NOTE — Patient Outreach (Signed)
[facility]) Care Management  07/12/2019  [name] Dec 15, 1940 [id number]   CSW attempted to contact patient today to perform the initial phone assessment, as well as assess and assist with social work needs and services; however, patient reported that she was at the store at the time of CSW's call, requesting to be able to return CSW's call at her convenience.  CSW voiced understanding, providing patient with CSW's contact information.  CSW will make an attempt to try and contact patient again next week, if a return call is not received from patient in the meantime.  [name], BSW, MSW, LCSW  Licensed Education officer, environmental Health System  Mailing [place] [address] [place], [place] [address] Physical Address-[address], [place], [place] [address] Toll Free Main # [phone] Fax # [phone] Cell # [phone]  Office # [phone] [name]

## 2019-07-14 NOTE — Progress Notes (Deleted)
Cardiology Clinic Note   Patient Name: [name] Date of Encounter: 07/14/2019  Primary Care Provider:  Rutherford Guys, MD Primary Cardiologist:  Quay Burow, MD  Patient Profile    [name]. [place] 79 year old female presents today for follow-up evaluation of her hypertension.  Past Medical History    Past Medical History:  Diagnosis Date  . Anxiety   . Arthritis   . Chronic pain    a. Prior h/o chronic pain on methadone.  . Chronic systolic CHF (congestive heart failure) (HCC)    a. mixed ischemic/non-ischemic cardiomyopathy. b. s/p CRT-D in 06/2014.  Marland Kitchen CKD (chronic kidney disease), stage III   . Complication of anesthesia    hard time waking her up from general surgery  . Coronary artery disease    a. remote RCA stenting in 2008 with non-DES. b. Cath 06/2014 following abnormal nuc: Stable, unchanged from prior cath, patent stent and 40% LM.  . Diabetes mellitus ([place])   . GERD (gastroesophageal reflux disease)   . Headache   . Hyperlipidemia   . Hypertension   . LBBB (left bundle branch block)   . Nonischemic cardiomyopathy ([place])   . OSA (obstructive sleep apnea)    AHI-9.77/hr, during REM-50.32/hr  . Tobacco abuse    Past Surgical History:  Procedure Laterality Date  . BACK SURGERY  2013  . BI-VENTRICULAR IMPLANTABLE CARDIOVERTER DEFIBRILLATOR N/A 06/11/2014   STJ CRTD implanted by Dr Caryl Comes  . CARDIAC CATHETERIZATION  12/14/2006   RCA stented with a 3.0 [employer] Liberte stent resulting in a reduction of 75% to 0% residual  . CHOLECYSTECTOMY     20 years ago  . COLONOSCOPY WITH PROPOFOL N/A 12/15/2014   Procedure: COLONOSCOPY WITH PROPOFOL;  Surgeon: Clarene Essex, MD;  Location: [facility] ENDOSCOPY;  Service: Endoscopy;  Laterality: N/A;  . EYE SURGERY     bilateral cataract surgery   . LEFT AND RIGHT HEART CATHETERIZATION WITH CORONARY ANGIOGRAM N/A 05/29/2014   Procedure: LEFT AND RIGHT HEART CATHETERIZATION WITH CORONARY ANGIOGRAM;  Surgeon: Lorretta Harp,  MD;  Location: [facility] CATH LAB;  Service: Cardiovascular;  Laterality: N/A;  . LEFT HEART CATHETERIZATION WITH CORONARY ANGIOGRAM N/A 12/27/2012   Procedure: LEFT HEART CATHETERIZATION WITH CORONARY ANGIOGRAM;  Surgeon: Lorretta Harp, MD;  Location: [facility] CATH LAB;  Service: Cardiovascular;  Laterality: N/A;    Allergies  Allergies  Allergen Reactions  . Valium [Diazepam] Swelling    face    History of Present Illness    [name] has a past medical history of essential hypertension, dyslipidemia, tobacco abuse, coronary artery disease status post RCA stenting.  ICD-biventricular ICD implanted by Dr. Caryl Comes 06/11/2014, and carotid stenosis.  She underwent cardiac catheterization 2/09 after having an abnormal nuclear stress test showing 30-40% eccentric distal left main.  A proximal RCA stent was patent and her EF was normal.  She has chronic left bundle branch block.  She is a 1-1/2 pack/day smoker.  Her nuclear stress test 03/24/2010 showed new anterior apical scar.  She underwent cardiac catheterization by Dr. Gwenlyn Found 03/31/2010 that showed 50% in-stent restenosis with her RCA stent, 40% distal left main and normal LV function.  She again underwent cardiac catheterization 12/26/2012 that showed unchanged anatomy.  Her RCA stent was patent in her left main showed mild disease.  She did have mild to moderate left ventricular dysfunction with an EF of 45%.  When she was seen in follow-up in November 14 she was noted to have occasional chest  pain which had not changed in frequency or severity.  Her lower extremity arterial Dopplers on 03/14/2014 showed ABIs of 1 bilaterally high frequency signal in the left external iliac artery although her symptoms are symmetric.  She underwent echocardiogram 05/20/2014 and nuclear stress test which showed an EF of 20%, dilated left ventricular and moderate MR.  She again underwent cardiac catheterizations 05/29/2014 which showed unchanged coronary anatomy with severe LV  dysfunction and elevated filling pressures.  She underwent BiV ICD implantation by Dr. Caryl Comes on 4/16.  Her follow-up echocardiogram 10/22/2015 showed an EF of 50-55%. She was seen by Dr. Gwenlyn Found in follow-up and remained clinically stable.  She was seen in follow-up by Dr. Caryl Comes on 09/17/2018 for evaluation of her BiV ICD and was doing well at that time as well.  Her husband passed away 2018/07/16.  They have been married 47 years.  She was seen by Kerin Ransom, PA-C on 05/06/2019 and was found to be hypotensive.  Her furosemide was reduced at that time.  She denied heart failure type symptoms.  She continued to smoke a pack and a half a day.  Her carotid Dopplers from 05/06/2019 showed significant left common carotid artery stenosis.  She is seen in the clinic today for follow-up evaluation and states***  *** denies chest pain, shortness of breath, lower extremity edema, fatigue, palpitations, melena, hematuria, hemoptysis, diaphoresis, weakness, presyncope, syncope, orthopnea, and PND.    Home Medications    Prior to Admission medications   Medication Sig Start Date End Date Taking? Authorizing Provider  ALPRAZolam Duanne Moron) 1 MG tablet Take 1 mg by mouth daily as needed. 04/25/18   [provider]  Ascorbic Acid (VITAMIN C) 100 MG tablet Take 100 mg by mouth daily.    [provider]  aspirin EC 81 MG tablet Take 81 mg by mouth at bedtime.    [provider]  blood glucose meter kit and supplies KIT Dispense based on patient and insurance preference. Use three times a day as directed. Dx. E11.65, Z79.4 05/16/19   Rutherford Guys, MD  carvedilol (COREG) 12.5 MG tablet TAKE 1 1/2 TABLET BY MOUTH TWICE A DAY 03/13/19   Deboraha Sprang, MD  citalopram (CELEXA) 20 MG tablet Take 20 mg by mouth daily. 12/08/18   [provider]  clopidogrel (PLAVIX) 75 MG tablet Take 1 tablet by mouth once daily 02/15/19   Lorretta Harp, MD  furosemide (LASIX) 40 MG tablet ON Monday  Wednesday AND Friday TAKE 2 TABS BY MOUTH IN A.M AND TAKE 1 TAB AT P.M. 05/06/19   Erlene Quan, PA-C  insulin aspart (NOVOLOG FLEXPEN) 100 UNIT/ML FlexPen Per insulin sliding scale, max TTD, 20 units. 06/26/17   Rutherford Guys, MD  Insulin Detemir (LEVEMIR) 100 UNIT/ML Pen Inject 16 Units into the skin daily at 10 pm. 09/04/18   Rutherford Guys, MD  Insulin Pen Needle (BD PEN NEEDLE MICRO U/F) 32G X 6 MM MISC USE NEW NEEDLE FOR EACH INJECTION OF INSULIN, FOUR TIMES DAILY 04/30/19   Rutherford Guys, MD  ipratropium (ATROVENT) 0.03 % nasal spray Place 2 sprays into both nostrils 2 (two) times daily. 05/23/19   Rutherford Guys, MD  levothyroxine (SYNTHROID) 88 MCG tablet TAKE 1 TABLET BY MOUTH AT BEDTIME 06/12/19   Rutherford Guys, MD  mirtazapine (REMERON) 30 MG tablet Take 1 tablet (30 mg total) by mouth at bedtime. 12/27/18   Rutherford Guys, MD  Moringa Oleifera (MORINGA PO) Take  5,000 mg by mouth.    [provider]  nitroGLYCERIN (NITROSTAT) 0.4 MG SL tablet Place 1 tablet (0.4 mg total) under the tongue every 5 (five) minutes as needed for chest pain. 05/06/19   Erlene Quan, PA-C  ONE TOUCH ULTRA TEST test strip  06/07/17   [provider]  Jonetta Speak LANCETS 85I Daisy  06/07/17   [provider]  oxyCODONE-acetaminophen (PERCOCET/ROXICET) 5-325 MG tablet Take by mouth every 4 (four) hours as needed for severe pain.    [provider]  potassium chloride SA (KLOR-CON) 20 MEQ tablet TAKE 1 TAB BY MOUTH 2 TIMES A DAY ON Monday, Wednesday AND Friday  (days you take Lasix) 05/21/19   Kilroy, Doreene Burke, PA-C  simvastatin (ZOCOR) 20 MG tablet TAKE 1 TABLET BY MOUTH ONCE DAILY WITH SUPPER 06/19/19   Rutherford Guys, MD  triamcinolone cream (KENALOG) 0.1 % APPLY ONE APPLICATION TOPICALLY TWO TIMES DAILY 11/28/18   Rutherford Guys, MD    Family History    Family History  Problem Relation Age of Onset  . Stroke Mother   . Hypertension Mother   . Coronary artery  disease Father   . Stroke Brother   . Heart disease Brother   . Other Brother        H1N1 VIRUS  . Healthy Sister   . Healthy Sister    She indicated that her mother is deceased. She indicated that her father is deceased. She indicated that two of her five sisters are alive. She indicated that only one of her six brothers is alive. She indicated that her maternal grandmother is deceased. She indicated that her maternal grandfather is deceased. She indicated that her paternal grandmother is deceased. She indicated that her paternal grandfather is deceased. She indicated that her son is deceased. She indicated that all of her four children are alive.  Social History    Social History   Socioeconomic History  . Marital status: Widowed    Spouse name: Not on file  . Number of children: Not on file  . Years of education: Not on file  . Highest education level: Not on file  Occupational History  . Not on file  Tobacco Use  . Smoking status: Current Every Day Smoker    Packs/day: 2.00    Years: 58.00    Pack years: 116.00    Types: Cigarettes    Start date: 12/26/1952    Last attempt to quit: 12/05/2013    Years since quitting: 5.6  . Smokeless tobacco: Never Used  . Tobacco comment: uses Vape cigarettes  Substance and Sexual Activity  . Alcohol use: No    Alcohol/week: 0.0 standard drinks  . Drug use: No  . Sexual activity: Not on file  Other Topics Concern  . Not on file  Social History Narrative   Patient lives in [place] with his husband and grandson.   Son completed suicide on September 17th of this year, patient found him.   Social Determinants of Health   Financial Resource Strain:   . Difficulty of Paying Living Expenses:   Food Insecurity:   . Worried About Charity fundraiser in the Last Year:   . Arboriculturist in the Last Year:   Transportation Needs:   . Film/video editor (Medical):   Marland Kitchen Lack of Transportation (Non-Medical):   Physical Activity:   .  Days of Exercise per Week:   . Minutes of Exercise per Session:  Stress:   . Feeling of Stress :   Social Connections:   . Frequency of Communication with Friends and Family:   . Frequency of Social Gatherings with Friends and Family:   . Attends Religious Services:   . Active Member of Clubs or Organizations:   . Attends Archivist Meetings:   Marland Kitchen Marital Status:   Intimate Partner Violence:   . Fear of Current or Ex-Partner:   . Emotionally Abused:   Marland Kitchen Physically Abused:   . Sexually Abused:      Review of Systems    General:  No chills, fever, night sweats or weight changes.  Cardiovascular:  No chest pain, dyspnea on exertion, edema, orthopnea, palpitations, paroxysmal nocturnal dyspnea. Dermatological: No rash, lesions/masses Respiratory: No cough, dyspnea Urologic: No hematuria, dysuria Abdominal:   No nausea, vomiting, diarrhea, bright red blood per rectum, melena, or hematemesis Neurologic:  No visual changes, wkns, changes in mental status. All other systems reviewed and are otherwise negative except as noted above.  Physical Exam    VS:  There were no vitals taken for this visit. , BMI There is no height or weight on file to calculate BMI. GEN: Well nourished, well developed, in no acute distress. HEENT: normal. Neck: Supple, no JVD, carotid bruits, or masses. Cardiac: RRR, no murmurs, rubs, or gallops. No clubbing, cyanosis, edema.  Radials/DP/PT 2+ and equal bilaterally.  Respiratory:  Respirations regular and unlabored, clear to auscultation bilaterally. GI: Soft, nontender, nondistended, BS + x 4. MS: no deformity or atrophy. Skin: warm and dry, no rash. Neuro:  Strength and sensation are intact. Psych: Normal affect.  Accessory Clinical Findings    ECG personally reviewed by me today- *** - No acute changes  Echocardiogram 10/22/2015 Study Conclusions   - Left ventricle: Systolic function was normal. The estimated  ejection fraction was in  the range of 50% to 55%. Doppler  parameters are consistent with abnormal left ventricular  relaxation (grade 1 diastolic dysfunction).  - Impressions: AV optimazation with no clear change in mitral  inflow as they all showed abnormal relaxation with prominant  atrial contribution to filling  and no CO calculated at varying AV intervals   Impressions:   - AV optimazation with no clear change in mitral inflow as they all  showed abnormal relaxation with prominant atrial contribution to  filling  and no CO calculated at varying AV intervals  Assessment & Plan   1.  Essential hypertension-BP today***.  Better controlled at home*** Continue carvedilol Heart healthy low-sodium diet Increase physical activity as tolerated  Dyslipidemia (goal below 70)-LDL 68 on 04/17/2019 Continue statin Heart healthy low-sodium high-fiber diet Increase physical activity as tolerated  Coronary artery disease-no chest pain today.  Cardiac catheterization 05/29/2014 showed unchanged coronary anatomy and severe LV dysfunction. Continue current medical therapy Heart healthy low-sodium diet Increase physical activity as tolerated  ICD-BiV ICD implanted by Dr. Caryl Comes on 06/11/2014.  Follow-up echocardiogram showed an improvement in the EF to 50-55% Followed by EP  Carotid stenosis-recent carotid Doppler showed significant left carotid artery stenosis.  CTA showed noncalcified plaque along the mid to distal common carotid causing up to 75% stenosis.  Mild calcified plaque at the ICA without measurable stenosis.  No evidence of flow limitation was noted.  There was also a left upper lobe pulmonary nodule measuring 8 x 5 mm that was new from her 2015 scan.  A noncontrast follow-up CT chest in 6 to 12 months is recommended. Order CT chest in  6 months  Disposition: Follow-up with Dr. Gwenlyn Found in 6 months.  Jossie Ng. Mical Kicklighter NP-C    07/14/2019, 8:16 PM [facility] [place] Office [phone] Fax [phone]

## 2019-07-15 ENCOUNTER — Ambulatory Visit: Payer: Medicare HMO | Admitting: General Practice

## 2019-07-18 ENCOUNTER — Other Ambulatory Visit: Payer: Self-pay | Admitting: *Deleted

## 2019-07-18 ENCOUNTER — Encounter: Payer: Self-pay | Admitting: *Deleted

## 2019-07-18 NOTE — Patient Outreach (Signed)
[facility]) Care Management  07/18/2019  [name] [place] 30-Dec-1940 [id number]  CSW received a new referral on patient, requesting that CSW provide counseling and supportive services for symptoms of grief and loss, as well as depression, in addition to referring patient to various support groups and providing patient with a list of counselors/therapists in [facility] that accept [employer].  CSW made an attempt to try and contact patient today to perform the initial phone assessment, as well as assess and assist with social work needs and services, without success.  A HIPAA compliant message was left for patient on voicemail.  CSW is currently awaiting a return call.  CSW will make a second outreach attempt within the next 3-4 business days, if a return call is not received from patient in the meantime.  CSW will also mail an Outreach Letter to patient's home requesting that patient contact CSW if patient is interested in receiving social work services through [facility] with Scientist, clinical (histocompatibility and immunogenetics).  [name], BSW, MSW, LCSW  Licensed Education officer, environmental [facility]  Mailing [place] [address] [place], [place] [address] Physical Address-[address], [place], [place] [address] Toll Free Main # [phone] Fax # [phone] Cell # [phone]  Office # [phone] [name]

## 2019-07-19 ENCOUNTER — Ambulatory Visit: Payer: Self-pay | Admitting: *Deleted

## 2019-07-21 NOTE — Progress Notes (Signed)
Cardiology Clinic Note   Patient Name: [name] Date of Encounter: 07/22/2019  Primary Care Provider:  Rutherford Guys, MD Primary Cardiologist:  Quay Burow, MD  Patient Profile    [name] 79 year old female presents today for follow-up evaluation of her hypertension.  Past Medical History    Past Medical History:  Diagnosis Date  . Anxiety   . Arthritis   . Chronic pain    a. Prior h/o chronic pain on methadone.  . Chronic systolic CHF (congestive heart failure) (HCC)    a. mixed ischemic/non-ischemic cardiomyopathy. b. s/p CRT-D in 06/2014.  Marland Kitchen CKD (chronic kidney disease), stage III   . Complication of anesthesia    hard time waking her up from general surgery  . Coronary artery disease    a. remote RCA stenting in 2008 with non-DES. b. Cath 06/2014 following abnormal nuc: Stable, unchanged from prior cath, patent stent and 40% LM.  . Diabetes mellitus ([place])   . GERD (gastroesophageal reflux disease)   . Headache   . Hyperlipidemia   . Hypertension   . LBBB (left bundle branch block)   . Nonischemic cardiomyopathy ([place])   . OSA (obstructive sleep apnea)    AHI-9.77/hr, during REM-50.32/hr  . Tobacco abuse    Past Surgical History:  Procedure Laterality Date  . BACK SURGERY  2013  . BI-VENTRICULAR IMPLANTABLE CARDIOVERTER DEFIBRILLATOR N/A 06/11/2014   STJ CRTD implanted by Dr Caryl Comes  . CARDIAC CATHETERIZATION  12/14/2006   RCA stented with a 3.0 [employer] Liberte stent resulting in a reduction of 75% to 0% residual  . CHOLECYSTECTOMY     20 years ago  . COLONOSCOPY WITH PROPOFOL N/A 12/15/2014   Procedure: COLONOSCOPY WITH PROPOFOL;  Surgeon: Clarene Essex, MD;  Location: [facility] ENDOSCOPY;  Service: Endoscopy;  Laterality: N/A;  . EYE SURGERY     bilateral cataract surgery   . LEFT AND RIGHT HEART CATHETERIZATION WITH CORONARY ANGIOGRAM N/A 05/29/2014   Procedure: LEFT AND RIGHT HEART CATHETERIZATION WITH CORONARY ANGIOGRAM;  Surgeon: Lorretta Harp, MD;  Location: [facility] CATH LAB;  Service: Cardiovascular;  Laterality: N/A;  . LEFT HEART CATHETERIZATION WITH CORONARY ANGIOGRAM N/A 12/27/2012   Procedure: LEFT HEART CATHETERIZATION WITH CORONARY ANGIOGRAM;  Surgeon: Lorretta Harp, MD;  Location: [facility] CATH LAB;  Service: Cardiovascular;  Laterality: N/A;    Allergies  Allergies  Allergen Reactions  . Valium [Diazepam] Swelling    face    History of Present Illness    [name] has a past medical history of essential hypertension, dyslipidemia, tobacco abuse, coronary artery disease status post RCA stenting.  ICD-biventricular ICD implanted by Dr. Caryl Comes 06/11/2014, and carotid stenosis.  She underwent cardiac catheterization 2/09 after having an abnormal nuclear stress test showing 30-40% eccentric distal left main.  A proximal RCA stent was patent and her EF was normal.  She has chronic left bundle branch block.  She is a 1-1/2 pack/day smoker.  Her nuclear stress test 03/24/2010 showed new anterior apical scar.  She underwent cardiac catheterization by Dr. Gwenlyn Found 03/31/2010 that showed 50% in-stent restenosis with her RCA stent, 40% distal left main and normal LV function.  She again underwent cardiac catheterization 12/26/2012 that showed unchanged anatomy.  Her RCA stent was patent in her left main showed mild disease.  She did have mild to moderate left ventricular dysfunction with an EF of 45%.  When she was seen in follow-up in November 14 she was noted to have occasional chest  pain which had not changed in frequency or severity.  Her lower extremity arterial Dopplers on 03/14/2014 showed ABIs of 1 bilaterally high frequency signal in the left external iliac artery although her symptoms are symmetric.  She underwent echocardiogram 05/20/2014 and nuclear stress test which showed an EF of 20%, dilated left ventricular and moderate MR.  She again underwent cardiac catheterizations 05/29/2014 which showed unchanged coronary anatomy with severe LV  dysfunction and elevated filling pressures.  She underwent BiV ICD implantation by Dr. Caryl Comes on 4/16.  Her follow-up echocardiogram 10/22/2015 showed an EF of 50-55%. She was seen by Dr. Gwenlyn Found in follow-up and remained clinically stable.  She was seen in follow-up by Dr. Caryl Comes on 09/17/2018 for evaluation of her BiV ICD and was doing well at that time as well.  Her husband passed away 17-Jul-2018.  They have been married 64 years.  She was seen by Kerin Ransom, PA-C on 05/06/2019 and was found to be hypotensive.  Her furosemide was reduced at that time.  She denied heart failure type symptoms.  She continued to smoke a pack and a half a day.  Her carotid Dopplers from 05/06/2019 showed significant left common carotid artery stenosis.  She is seen in the clinic today for follow-up evaluation and states she feels well.  She contacted her PCP due to steady weight gain after reducing furosemide.  She is now back on her 80 mg morning and 40 mg evening dosing.  She had follow-up labs done which showed no increase and creatinine and stable potassium.  She states that she still has some grief from losing her husband and her son.  However, she is now planting flowers again and enjoying being outdoors.  She is occasionally outside planting flowers for 10 hours at a time.  She has a very large 24 acre property.  She does use sodium sparingly on her food and indicates that she has switched to "no salt" seasoning.  She states that she has tried to stop smoking several times but has been unable to quit.  I will give her the mindfulness stress reduction sheet, the salty 6 dietary she, continue Lasix, and have her follow-up with Dr. Gwenlyn Found in 6 months.  Today she denies chest pain, shortness of breath, lower extremity edema, fatigue, palpitations, melena, hematuria, hemoptysis, diaphoresis, weakness, presyncope, syncope, orthopnea, and PND.   Home Medications    Prior to Admission medications   Medication Sig Start Date End Date  Taking? Authorizing Provider  ALPRAZolam Duanne Moron) 1 MG tablet Take 1 mg by mouth daily as needed. 04/25/18   [provider]  Ascorbic Acid (VITAMIN C) 100 MG tablet Take 100 mg by mouth daily.    [provider]  aspirin EC 81 MG tablet Take 81 mg by mouth at bedtime.    [provider]  blood glucose meter kit and supplies KIT Dispense based on patient and insurance preference. Use three times a day as directed. Dx. E11.65, Z79.4 05/16/19   Rutherford Guys, MD  carvedilol (COREG) 12.5 MG tablet TAKE 1 1/2 TABLET BY MOUTH TWICE A DAY 03/13/19   Deboraha Sprang, MD  citalopram (CELEXA) 20 MG tablet Take 20 mg by mouth daily. 12/08/18   [provider]  clopidogrel (PLAVIX) 75 MG tablet Take 1 tablet by mouth once daily 02/15/19   Lorretta Harp, MD  furosemide (LASIX) 40 MG tablet ON Monday Wednesday AND Friday TAKE 2 TABS BY MOUTH IN A.M AND TAKE 1 TAB AT  P.M. 05/06/19   Erlene Quan, PA-C  insulin aspart (NOVOLOG FLEXPEN) 100 UNIT/ML FlexPen Per insulin sliding scale, max TTD, 20 units. 06/26/17   Rutherford Guys, MD  Insulin Detemir (LEVEMIR) 100 UNIT/ML Pen Inject 16 Units into the skin daily at 10 pm. 09/04/18   Rutherford Guys, MD  Insulin Pen Needle (BD PEN NEEDLE MICRO U/F) 32G X 6 MM MISC USE NEW NEEDLE FOR EACH INJECTION OF INSULIN, FOUR TIMES DAILY 04/30/19   Rutherford Guys, MD  ipratropium (ATROVENT) 0.03 % nasal spray Place 2 sprays into both nostrils 2 (two) times daily. 05/23/19   Rutherford Guys, MD  levothyroxine (SYNTHROID) 88 MCG tablet TAKE 1 TABLET BY MOUTH AT BEDTIME 06/12/19   Rutherford Guys, MD  mirtazapine (REMERON) 30 MG tablet Take 1 tablet (30 mg total) by mouth at bedtime. 12/27/18   Rutherford Guys, MD  Moringa Oleifera (MORINGA PO) Take 5,000 mg by mouth.    [provider]  nitroGLYCERIN (NITROSTAT) 0.4 MG SL tablet Place 1 tablet (0.4 mg total) under the tongue every 5 (five) minutes as needed for chest pain. 05/06/19    Erlene Quan, PA-C  ONE TOUCH ULTRA TEST test strip  06/07/17   [provider]  Jonetta Speak LANCETS [address] [place]  06/07/17   [provider]  oxyCODONE-acetaminophen (PERCOCET/ROXICET) 5-325 MG tablet Take by mouth every 4 (four) hours as needed for severe pain.    [provider]  potassium chloride SA (KLOR-CON) 20 MEQ tablet TAKE 1 TAB BY MOUTH 2 TIMES A DAY ON Monday, Wednesday AND Friday  (days you take Lasix) 05/21/19   Kilroy, Doreene Burke, PA-C  simvastatin (ZOCOR) 20 MG tablet TAKE 1 TABLET BY MOUTH ONCE DAILY WITH SUPPER 06/19/19   Rutherford Guys, MD  triamcinolone cream (KENALOG) 0.1 % APPLY ONE APPLICATION TOPICALLY TWO TIMES DAILY 11/28/18   Rutherford Guys, MD    Family History    Family History  Problem Relation Age of Onset  . Stroke Mother   . Hypertension Mother   . Coronary artery disease Father   . Stroke Brother   . Heart disease Brother   . Other Brother        H1N1 VIRUS  . Healthy Sister   . Healthy Sister    She indicated that her mother is deceased. She indicated that her father is deceased. She indicated that two of her five sisters are alive. She indicated that only one of her six brothers is alive. She indicated that her maternal grandmother is deceased. She indicated that her maternal grandfather is deceased. She indicated that her paternal grandmother is deceased. She indicated that her paternal grandfather is deceased. She indicated that her son is deceased. She indicated that all of her four children are alive.  Social History    Social History   Socioeconomic History  . Marital status: Widowed    Spouse name: Not on file  . Number of children: Not on file  . Years of education: Not on file  . Highest education level: Not on file  Occupational History  . Not on file  Tobacco Use  . Smoking status: Current Every Day Smoker    Packs/day: 2.00    Years: 58.00    Pack years: 116.00    Types: Cigarettes    Start date:  12/26/1951    Last attempt to quit: 12/05/2013    Years since quitting: 5.6  . Smokeless tobacco: Never Used  .  Tobacco comment: uses Vape cigarettes  Substance and Sexual Activity  . Alcohol use: No    Alcohol/week: 0.0 standard drinks  . Drug use: No  . Sexual activity: Not on file  Other Topics Concern  . Not on file  Social History Narrative   Patient lives in [place] with his husband and grandson.   Son completed suicide on September 17th of this year, patient found him.   Social Determinants of Health   Financial Resource Strain:   . Difficulty of Paying Living Expenses:   Food Insecurity:   . Worried About Charity fundraiser in the Last Year:   . Arboriculturist in the Last Year:   Transportation Needs:   . Film/video editor (Medical):   Marland Kitchen Lack of Transportation (Non-Medical):   Physical Activity:   . Days of Exercise per Week:   . Minutes of Exercise per Session:   Stress:   . Feeling of Stress :   Social Connections:   . Frequency of Communication with Friends and Family:   . Frequency of Social Gatherings with Friends and Family:   . Attends Religious Services:   . Active Member of Clubs or Organizations:   . Attends Archivist Meetings:   Marland Kitchen Marital Status:   Intimate Partner Violence:   . Fear of Current or Ex-Partner:   . Emotionally Abused:   Marland Kitchen Physically Abused:   . Sexually Abused:      Review of Systems    General:  No chills, fever, night sweats or weight changes.  Cardiovascular:  No chest pain, dyspnea on exertion, edema, orthopnea, palpitations, paroxysmal nocturnal dyspnea. Dermatological: No rash, lesions/masses Respiratory: No cough, dyspnea Urologic: No hematuria, dysuria Abdominal:   No nausea, vomiting, diarrhea, bright red blood per rectum, melena, or hematemesis Neurologic:  No visual changes, wkns, changes in mental status. All other systems reviewed and are otherwise negative except as noted above.  Physical Exam      VS:  BP 100/66 (BP Location: Left Arm, Patient Position: Sitting, Cuff Size: Normal)   Pulse 60   Wt 148 lb 9.6 oz (67.4 kg)   BMI 24.73 kg/m  , BMI Body mass index is 24.73 kg/m. GEN: Well nourished, well developed, in no acute distress. HEENT: normal. Neck: Supple, no JVD, carotid bruits, or masses. Cardiac: RRR, no murmurs, rubs, or gallops. No clubbing, cyanosis, edema.  Radials/DP/PT 2+ and equal bilaterally.  Respiratory:  Respirations regular and unlabored, clear to auscultation bilaterally. GI: Soft, nontender, nondistended, BS + x 4. MS: no deformity or atrophy. Skin: warm and dry, no rash. Neuro:  Strength and sensation are intact. Psych: Normal affect.  Accessory Clinical Findings    ECG personally reviewed by me today-none today.  Echocardiogram 10/22/2015 Study Conclusions   - Left ventricle: Systolic function was normal. The estimated    ejection fraction was in the range of 50% to 55%. Doppler    parameters are consistent with abnormal left ventricular    relaxation (grade 1 diastolic dysfunction).  - Impressions: AV optimazation with no clear change in mitral    inflow as they all showed abnormal relaxation with prominant    atrial contribution to filling    and no CO calculated at varying AV intervals   Impressions:   - AV optimazation with no clear change in mitral inflow as they all    showed abnormal relaxation with prominant atrial contribution to    filling    and no CO  calculated at varying AV intervals  Assessment & Plan   1.  Essential hypertension-BP today 100/66.  Controlled at home. Continue carvedilol Heart healthy low-sodium diet Increase physical activity as tolerated  Chronic systolic CHF-euvolemic today.  Weight stable.  Increased furosemide due to weight gain. BMP WDL Heart healthy low-sodium diet Daily weights Continue current medication therapy  Dyslipidemia (goal below 70)-LDL 68 on 04/17/2019 Continue statin Heart healthy  low-sodium high-fiber diet Increase physical activity as tolerated  Coronary artery disease-no chest pain today.  Cardiac catheterization 05/29/2014 showed unchanged coronary anatomy and severe LV dysfunction. Continue current medical therapy Heart healthy low-sodium diet Increase physical activity as tolerated  ICD-BiV ICD implanted by Dr. Caryl Comes on 06/11/2014.  Follow-up echocardiogram showed an improvement in the EF to 50-55% Followed by EP  Carotid stenosis-recent carotid Doppler showed significant left carotid artery stenosis.  CTA showed noncalcified plaque along the mid to distal common carotid causing up to 75% stenosis.  Mild calcified plaque at the ICA without measurable stenosis.  No evidence of flow limitation was noted.  There was also a left upper lobe pulmonary nodule measuring 8 x 5 mm that was new from her 2015 scan.  A noncontrast follow-up CT chest in 6 to 12 months is recommended. Order CT chest in 6 months  Disposition: Follow-up with Dr. Gwenlyn Found in 6 months.  Jossie Ng. Prinston Kynard NP-C    07/22/2019, 3:43 PM [address] Office [phone] Fax [phone]

## 2019-07-22 ENCOUNTER — Other Ambulatory Visit: Payer: Self-pay

## 2019-07-22 ENCOUNTER — Ambulatory Visit: Payer: Medicare HMO | Admitting: General Practice

## 2019-07-22 ENCOUNTER — Encounter: Payer: Self-pay | Admitting: General Practice

## 2019-07-22 VITALS — BP 100/66 | HR 60 | Wt 148.6 lb

## 2019-07-22 DIAGNOSIS — Z95810 Presence of automatic (implantable) cardiac defibrillator: Secondary | ICD-10-CM

## 2019-07-22 DIAGNOSIS — I5022 Chronic systolic (congestive) heart failure: Secondary | ICD-10-CM

## 2019-07-22 DIAGNOSIS — I10 Essential (primary) hypertension: Secondary | ICD-10-CM

## 2019-07-22 DIAGNOSIS — I2510 Atherosclerotic heart disease of native coronary artery without angina pectoris: Secondary | ICD-10-CM | POA: Diagnosis not present

## 2019-07-22 DIAGNOSIS — E785 Hyperlipidemia, unspecified: Secondary | ICD-10-CM | POA: Diagnosis not present

## 2019-07-22 DIAGNOSIS — R0989 Other specified symptoms and signs involving the circulatory and respiratory systems: Secondary | ICD-10-CM

## 2019-07-22 MED ORDER — FUROSEMIDE 40 MG PO TABS: ORAL_TABLET | ORAL | 1 refills | Status: DC

## 2019-07-22 NOTE — Patient Instructions (Signed)
Medication Instructions:  The current medical regimen is effective;  continue present plan and medications as directed. Please refer to the Current Medication list given to you today. *If you need a refill on your cardiac medications before your next appointment, please call your pharmacy*  Special Instructions PLEASE READ AND FOLLOW MINDFULNESS TIPS-ATTACHED  PLEASE CONTINUE TO CHANGE POSITIONS SLOWLY  PLEASE READ AND FOLLOW SALTY 6-ATTACHED  Follow-Up: Your next appointment:  6 month(s) Please call our office 2 months in advance to schedule this appointment In Person with You may see Quay Burow, MD or one of the following Advanced Practice Providers on your designated Care Team:  Coletta Memos, [place], PA-C  Sande Rives, PA-C  At [facility], you and your health needs are our priority.  As part of our continuing mission to provide you with exceptional heart care, we have created designated Provider Care Teams.  These Care Teams include your primary Cardiologist (physician) and Advanced Practice Providers (APPs -  Physician Assistants and Nurse Practitioners) who all work together to provide you with the care you need, when you need it.         Mindfulness-Based Stress Reduction Mindfulness-based stress reduction (MBSR) is a program that helps people learn to practice mindfulness. Mindfulness is the practice of intentionally paying attention to the present moment. It can be learned and practiced through techniques such as education, breathing exercises, meditation, and yoga. MBSR includes several mindfulness techniques in one program. MBSR works best when you understand the treatment, are willing to try new things, and can commit to spending time practicing what you learn. MBSR training may include learning about:  How your emotions, thoughts, and reactions affect your body.  New ways to respond to things that cause negative thoughts to start (triggers).  How to  notice your thoughts and let go of them.  Practicing awareness of everyday things that you normally do without thinking.  The techniques and goals of different types of meditation. What are the benefits of MBSR? MBSR can have many benefits, which include helping you to:  Develop self-awareness. This refers to knowing and understanding yourself.  Learn skills and attitudes that help you to participate in your own health care.  Learn new ways to care for yourself.  Be more accepting about how things are, and let things go.  Be less judgmental and approach things with an open mind.  Be patient with yourself and trust yourself more. MBSR has also been shown to:  Reduce negative emotions, such as depression and anxiety.  Improve memory and focus.  Change how you sense and approach pain.  Boost your body's ability to fight infections.  Help you connect better with other people.  Improve your sense of well-being. Follow these instructions at home:   Find a local in-person or online MBSR program.  Set aside some time regularly for mindfulness practice.  Find a mindfulness practice that works best for you. This may include one or more of the following: ? Meditation. Meditation involves focusing your mind on a certain thought or activity. ? Breathing awareness exercises. These help you to stay present by focusing on your breath. ? Body scan. For this practice, you lie down and pay attention to each part of your body from head to toe. You can identify tension and soreness and intentionally relax parts of your body. ? Yoga. Yoga involves stretching and breathing, and it can improve your ability to move and be flexible. It can also provide  an experience of testing your body's limits, which can help you release stress. ? Mindful eating. This way of eating involves focusing on the taste, texture, color, and smell of each bite of food. Because this slows down eating and helps you feel  full sooner, it can be an important part of a weight-loss plan.  Find a podcast or recording that provides guidance for breathing awareness, body scan, or meditation exercises. You can listen to these any time when you have a free moment to rest without distractions.  Follow your treatment plan as told by your health care provider. This may include taking regular medicines and making changes to your diet or lifestyle as recommended. How to practice mindfulness To do a basic awareness exercise:  Find a comfortable place to sit.  Pay attention to the present moment. Observe your thoughts, feelings, and surroundings just as they are.  Avoid placing judgment on yourself, your feelings, or your surroundings. Make note of any judgment that comes up, and let it go.  Your mind may wander, and that is okay. Make note of when your thoughts drift, and return your attention to the present moment. To do basic mindfulness meditation:  Find a comfortable place to sit. This may include a stable chair or a firm floor cushion. ? Sit upright with your back straight. Let your arms fall next to your side with your hands resting on your legs. ? If sitting in a chair, rest your feet flat on the floor. ? If sitting on a cushion, cross your legs in front of you.  Keep your head in a neutral position with your chin dropped slightly. Relax your jaw and rest the tip of your tongue on the roof of your mouth. Drop your gaze to the floor. You can close your eyes if you like.  Breathe normally and pay attention to your breath. Feel the air moving in and out of your nose. Feel your belly expanding and relaxing with each breath.  Your mind may wander, and that is okay. Make note of when your thoughts drift, and return your attention to your breath.  Avoid placing judgment on yourself, your feelings, or your surroundings. Make note of any judgment or feelings that come up, let them go, and bring your attention back to your  breath.  When you are ready, lift your gaze or open your eyes. Pay attention to how your body feels after the meditation. Where to find more information You can find more information about MBSR from:  Your health care provider.  Community-based meditation centers or programs.  Programs offered near you. Summary  Mindfulness-based stress reduction (MBSR) is a program that teaches you how to intentionally pay attention to the present moment. It is used with other treatments to help you cope better with daily stress, emotions, and pain.  MBSR focuses on developing self-awareness, which allows you to respond to life stress without judgment or negative emotions.  MBSR programs may involve learning different mindfulness practices, such as breathing exercises, meditation, yoga, body scan, or mindful eating. Find a mindfulness practice that works best for you, and set aside time for it on a regular basis. This information is not intended to replace advice given to you by your health care provider. Make sure you discuss any questions you have with your health care provider. Document Revised: 02/03/2017 Document Reviewed: 06/30/2016 Elsevier Patient Education  [place].

## 2019-07-24 ENCOUNTER — Other Ambulatory Visit: Payer: Self-pay | Admitting: *Deleted

## 2019-07-24 NOTE — Patient Outreach (Signed)
[facility]) Care Management  07/24/2019  Annalise Mcdiarmid Anderle 1941/02/24 [id number]   CSW made a second attempt to try and contact patient today to perform phone assessment, as well as assess and assist with social work needs and services, without success.  A HIPAA compliant message was left for patient on voicemail.  CSW continues to await a return call.  CSW will make a third and final outreach attempt within the next 3-4 business days, if a return call is not received from patient in the meantime.  CSW will then proceed with case closure if a return call is not received from patient with a total of 10 business days, as required number of phone attempts will have been made and outreach letter mailed.   [name], BSW, MSW, LCSW  Licensed Education officer, [facility]  Mailing [place] [address] [place], [place] [address] Physical Address-[address], [place], [place] [address] Toll Free Main # [phone] Fax # [phone] Cell # [phone]  Office # [phone] [email or web address]

## 2019-07-29 ENCOUNTER — Encounter: Payer: Self-pay | Admitting: Family Medicine

## 2019-07-29 ENCOUNTER — Other Ambulatory Visit: Payer: Self-pay

## 2019-07-29 ENCOUNTER — Ambulatory Visit (INDEPENDENT_AMBULATORY_CARE_PROVIDER_SITE_OTHER): Payer: Medicare HMO

## 2019-07-29 ENCOUNTER — Ambulatory Visit (INDEPENDENT_AMBULATORY_CARE_PROVIDER_SITE_OTHER): Payer: Medicare HMO | Admitting: Family Medicine

## 2019-07-29 VITALS — BP 117/69 | HR 73 | Temp 98.1°F | Ht 65.0 in | Wt 149.4 lb

## 2019-07-29 DIAGNOSIS — Z95810 Presence of automatic (implantable) cardiac defibrillator: Secondary | ICD-10-CM | POA: Diagnosis not present

## 2019-07-29 DIAGNOSIS — L989 Disorder of the skin and subcutaneous tissue, unspecified: Secondary | ICD-10-CM

## 2019-07-29 DIAGNOSIS — I5022 Chronic systolic (congestive) heart failure: Secondary | ICD-10-CM | POA: Diagnosis not present

## 2019-07-29 DIAGNOSIS — R202 Paresthesia of skin: Secondary | ICD-10-CM

## 2019-07-29 MED ORDER — MUPIROCIN 2 % EX OINT: 1.0000 "application " | TOPICAL_OINTMENT | Freq: Three times a day (TID) | CUTANEOUS | 0 refills | Status: DC

## 2019-07-29 NOTE — Patient Instructions (Signed)
° ° ° °  If you have lab work done today you will be contacted with your lab results within the next 2 weeks.  If you have not heard from us then please contact us. The fastest way to get your results is to register for My Chart. ° ° °IF you received an x-ray today, you will receive an invoice from [facility]. Please contact [facility] at [phone] with questions or concerns regarding your invoice.  ° °IF you received labwork today, you will receive an invoice from [facility]. Please contact [facility] at [phone] with questions or concerns regarding your invoice.  ° °Our billing staff will not be able to assist you with questions regarding bills from these companies. ° °You will be contacted with the lab results as soon as they are available. The fastest way to get your results is to activate your My Chart account. Instructions are located on the last page of this paperwork. If you have not heard from us regarding the results in 2 weeks, please contact this office. °  ° ° ° °

## 2019-07-29 NOTE — Progress Notes (Signed)
5/24/20212:43 PM  [name] Jul 28, 1940, 79 y.o., female [id number]  Chief Complaint  Patient presents with  . spot on neck    spot on the right side of the neck noticed 5 months ago, feels textured like a burn  . Pain    in the ball of feet, thinks she may have arthritis, detatailed foot test competed    HPI:   Patient is a 79 y.o. female with past medical history significant for DM2 on insulin, osteomyelitis, CKD 3, CAD, CHF, HTN, HLP, OSA not on cpap, depression and anxiety who presents today with several concerns  Has noticed a skin lesion on right neck that has been present for several months, it burns at times, not changing, not bleeding  Also having deep numb feeling in both her feet upto just above her ankles however when she touches her feet or is walking she is able to feel everything She sometimes feels the balls of her feet are very tender She has established relationship with podiatry  Depression screen [facility] 2/9 06/04/2019 04/15/2019 04/11/2019  Decreased Interest 0 3 0  Down, Depressed, Hopeless 0 3 0  PHQ - 2 Score 0 6 0  Altered sleeping 0 3 -  Tired, decreased energy 0 3 -  Change in appetite 0 3 -  Feeling bad or failure about yourself  - - -  Trouble concentrating - - -  Moving slowly or fidgety/restless - - -  Suicidal thoughts - - -  PHQ-9 Score 0 15 -  Difficult doing work/chores - - -  Some recent data might be hidden    Fall Risk  07/29/2019 06/04/2019 04/11/2019 03/04/2019 12/13/2018  Falls in the past year? 0 0 0 0 0  Number falls in past yr: 0 0 0 0 0  Injury with Fall? 0 0 0 0 0  Risk for fall due to : - - - Impaired mobility -  Follow up - Falls evaluation completed Falls evaluation completed;Education provided - -     Allergies  Allergen Reactions  . Valium [Diazepam] Swelling    face    Prior to Admission medications   Medication Sig Start Date End Date Taking? Authorizing Provider  ALPRAZolam Duanne Moron) 1 MG tablet Take 1 mg by mouth  daily as needed. 04/25/18  Yes [provider]  Ascorbic Acid (VITAMIN C) 100 MG tablet Take 100 mg by mouth daily.   Yes [provider]  aspirin EC 81 MG tablet Take 81 mg by mouth at bedtime.   Yes [provider]  blood glucose meter kit and supplies KIT Dispense based on patient and insurance preference. Use three times a day as directed. Dx. E11.65, Z79.4 05/16/19  Yes Rutherford Guys, MD  carvedilol (COREG) 12.5 MG tablet TAKE 1 1/2 TABLET BY MOUTH TWICE A DAY 03/13/19  Yes Deboraha Sprang, MD  citalopram (CELEXA) 20 MG tablet Take 20 mg by mouth daily. 12/08/18  Yes [provider]  clopidogrel (PLAVIX) 75 MG tablet Take 1 tablet by mouth once daily 02/15/19  Yes Lorretta Harp, MD  furosemide (LASIX) 40 MG tablet Take 2 tablets (80 mg total) by mouth every morning AND 1 tablet (40 mg total) every evening. 80 MG IN THE AM AND 40MG IN THE PM.. 07/22/19  Yes Cleaver, Jossie Ng, NP  insulin aspart (NOVOLOG FLEXPEN) 100 UNIT/ML FlexPen Per insulin sliding scale, max TTD, 20 units. 06/26/17  Yes Rutherford Guys, MD  Insulin Detemir (LEVEMIR) 100  UNIT/ML Pen Inject 16 Units into the skin daily at 10 pm. 09/04/18  Yes Rutherford Guys, MD  Insulin Pen Needle (BD PEN NEEDLE MICRO U/F) 32G X 6 MM MISC USE NEW NEEDLE FOR EACH INJECTION OF INSULIN, FOUR TIMES DAILY 04/30/19  Yes Rutherford Guys, MD  ipratropium (ATROVENT) 0.03 % nasal spray Place 2 sprays into both nostrils 2 (two) times daily. 05/23/19  Yes Rutherford Guys, MD  levothyroxine (SYNTHROID) 88 MCG tablet TAKE 1 TABLET BY MOUTH AT BEDTIME 06/12/19  Yes Rutherford Guys, MD  mirtazapine (REMERON) 30 MG tablet Take 1 tablet (30 mg total) by mouth at bedtime. 12/27/18  Yes Rutherford Guys, MD  Moringa Oleifera (MORINGA PO) Take 5,000 mg by mouth.   Yes [provider]  nitroGLYCERIN (NITROSTAT) 0.4 MG SL tablet Place 1 tablet (0.4 mg total) under the tongue every 5 (five) minutes as needed for chest pain.  05/06/19  Yes Kilroy, Luke K, PA-C  ONE TOUCH ULTRA TEST test strip  06/07/17  Yes [provider]  Jonetta Speak LANCETS 93J [place]  06/07/17  Yes [provider]  oxyCODONE-acetaminophen (PERCOCET/ROXICET) 5-325 MG tablet Take by mouth every 4 (four) hours as needed for severe pain.   Yes [provider]  potassium chloride SA (KLOR-CON) 20 MEQ tablet TAKE 1 TAB BY MOUTH 2 TIMES A DAY ON Monday, Wednesday AND Friday  (days you take Lasix) 05/21/19  Yes Kilroy, Luke K, PA-C  simvastatin (ZOCOR) 20 MG tablet TAKE 1 TABLET BY MOUTH ONCE DAILY WITH SUPPER 06/19/19  Yes Rutherford Guys, MD  triamcinolone cream (KENALOG) 0.1 % APPLY ONE APPLICATION TOPICALLY TWO TIMES DAILY 11/28/18  Yes Rutherford Guys, MD    Past Medical History:  Diagnosis Date  . Anxiety   . Arthritis   . Chronic pain    a. Prior h/o chronic pain on methadone.  . Chronic systolic CHF (congestive heart failure) (HCC)    a. mixed ischemic/non-ischemic cardiomyopathy. b. s/p CRT-D in 06/2014.  Marland Kitchen CKD (chronic kidney disease), stage III   . Complication of anesthesia    hard time waking her up from general surgery  . Coronary artery disease    a. remote RCA stenting in 2008 with non-DES. b. Cath 06/2014 following abnormal nuc: Stable, unchanged from prior cath, patent stent and 40% LM.  . Diabetes mellitus ([place])   . GERD (gastroesophageal reflux disease)   . Headache   . Hyperlipidemia   . Hypertension   . LBBB (left bundle branch block)   . Nonischemic cardiomyopathy ([place])   . OSA (obstructive sleep apnea)    AHI-9.77/hr, during REM-50.32/hr  . Tobacco abuse     Past Surgical History:  Procedure Laterality Date  . BACK SURGERY  2013  . BI-VENTRICULAR IMPLANTABLE CARDIOVERTER DEFIBRILLATOR N/A 06/11/2014   STJ CRTD implanted by Dr Caryl Comes  . CARDIAC CATHETERIZATION  12/14/2006   RCA stented with a 3.0 [employer] Liberte stent resulting in a reduction of 75% to 0% residual  . CHOLECYSTECTOMY       20 years ago  . COLONOSCOPY WITH PROPOFOL N/A 12/15/2014   Procedure: COLONOSCOPY WITH PROPOFOL;  Surgeon: Clarene Essex, MD;  Location: [facility] ENDOSCOPY;  Service: Endoscopy;  Laterality: N/A;  . EYE SURGERY     bilateral cataract surgery   . LEFT AND RIGHT HEART CATHETERIZATION WITH CORONARY ANGIOGRAM N/A 05/29/2014   Procedure: LEFT AND RIGHT HEART CATHETERIZATION WITH CORONARY ANGIOGRAM;  Surgeon: Lorretta Harp, MD;  Location:  [facility] LAB;  Service: Cardiovascular;  Laterality: N/A;  . LEFT HEART CATHETERIZATION WITH CORONARY ANGIOGRAM N/A 12/27/2012   Procedure: LEFT HEART CATHETERIZATION WITH CORONARY ANGIOGRAM;  Surgeon: Lorretta Harp, MD;  Location: [facility] CATH LAB;  Service: Cardiovascular;  Laterality: N/A;    Social History   Tobacco Use  . Smoking status: Current Every Day Smoker    Packs/day: 2.00    Years: 58.00    Pack years: 116.00    Types: Cigarettes    Start date: 12/26/1952    Last attempt to quit: 12/05/2013    Years since quitting: 5.6  . Smokeless tobacco: Never Used  . Tobacco comment: uses Vape cigarettes  Substance Use Topics  . Alcohol use: No    Alcohol/week: 0.0 standard drinks    Family History  Problem Relation Age of Onset  . Stroke Mother   . Hypertension Mother   . Coronary artery disease Father   . Stroke Brother   . Heart disease Brother   . Other Brother        H1N1 VIRUS  . Healthy Sister   . Healthy Sister     ROS Per hpi  OBJECTIVE:  Today's Vitals   07/29/19 1430  BP: 117/69  Pulse: 73  Temp: 98.1 F (36.7 C)  SpO2: 96%  Weight: 149 lb 6.4 oz (67.8 kg)  Height: 5' 5" (1.651 m)   Body mass index is 24.86 kg/m.   Physical Exam Vitals and nursing note reviewed.  Constitutional:      Appearance: She is well-developed.  HENT:     Head: Normocephalic and atraumatic.  Eyes:     General: No scleral icterus.    Conjunctiva/sclera: Conjunctivae normal.     Pupils: Pupils are equal, round, and reactive to light.   Cardiovascular:     Pulses:          Dorsalis pedis pulses are 1+ on the right side and 1+ on the left side.       Posterior tibial pulses are 1+ on the right side and 1+ on the left side.  Pulmonary:     Effort: Pulmonary effort is normal.  Musculoskeletal:     Cervical back: Neck supple.     Right lower leg: No edema.     Left lower leg: No edema.     Right foot: Normal range of motion.     Left foot: Normal range of motion.  Feet:     Right foot:     Skin integrity: Skin integrity normal.     Toenail Condition: Right toenails are abnormally thick.     Left foot:     Skin integrity: Skin integrity normal.     Toenail Condition: Left toenails are abnormally thick.  Skin:    General: Skin is warm and dry.     Findings: Rash (right neck, ~ 61m, erythematous, macule with roughness and area that does not seem to be healing. ) present.  Neurological:     Mental Status: She is alert and oriented to person, place, and time.     Diabetic Foot Form - Detailed   Diabetic Foot Exam - detailed Can the patient see the bottom of their feet?: Yes Are the shoes appropriate in style and fit?: Yes Is there swelling or and abnormal foot shape?: No Is there a claw toe deformity?: No Is there elevated skin temparature?: Yes Is there foot or ankle muscle weakness?: Yes Normal Range of Motion: Yes Semmes-Weinstein Monofilament Test R  Site 1-Great Toe: Pos L Site 1-Great Toe: Pos        No results found for this or any previous visit (from the past 24 hour(s)).  No results found.   ASSESSMENT and PLAN  1. Skin lesion of neck Trial of mupirocin, SCC? referral to derm to further eval and treat - Ambulatory referral to Dermatology  2. Paresthesia of both feet Discussed history suggestive of peripheral neuropathy. She will reach out to podiatry for further eval.   Other orders - mupirocin ointment (BACTROBAN) 2 %; Apply 1 application topically 3 (three) times daily.  Return for as  scheduled.    Rutherford Guys, MD Primary Care at [place], [place] [address] Ph.  [phone] Fax [phone]

## 2019-07-30 ENCOUNTER — Encounter: Payer: Self-pay | Admitting: *Deleted

## 2019-07-30 ENCOUNTER — Telehealth: Payer: Self-pay

## 2019-07-30 ENCOUNTER — Other Ambulatory Visit: Payer: Self-pay | Admitting: *Deleted

## 2019-07-30 NOTE — Telephone Encounter (Signed)
Spoke with patient to remind of missed remote transmission 

## 2019-07-30 NOTE — Patient Outreach (Signed)
[facility]) Bradley Management  07/30/2019  [name] Sahm 11-05-1940 [id number]   CSW was only able to make brief contact with patient today, before patient terminated the call.  CSW explained to patient the reason for the call, offering to provide counseling and supportive services for symptoms of anxiety and depression; however, patient stated, "I'm not interested" and hung up the phone.  After thorough review of patient's EMR (Electronic Medical Record) in Epic, [place] noted that patient is already established with [name], Psychiatrist for medication management and psychotherapeutic services.    CSW will perform a case closure on patient, as all goals of treatment have been met from social work standpoint and no additional social work needs have been identified at this time.  CSW will notify patient's RNCM with [place] Management, [name] of CSW's plans to close patient's case.  CSW will fax an update to patient's Primary Bradley Physician, [name] to ensure that she is aware of CSW's involvement with patient's plan of Bradley.    [name], BSW, MSW, LCSW  Licensed Education officer, [facility]  Mailing [place] [address] [place], [place] [address] Physical Address-[address], [place], [place] [address] Toll Free Main # [phone] Fax # [phone] Cell # [phone]  Office # [phone] [email or web address]

## 2019-07-31 ENCOUNTER — Telehealth: Payer: Self-pay

## 2019-07-31 NOTE — Telephone Encounter (Signed)
Remote ICM transmission received.  Attempted call to patient regarding ICM remote transmission and left detailed message per DPR.  Advised to return call for any fluid symptoms or questions. Next ICM remote transmission scheduled 09/02/2019.

## 2019-07-31 NOTE — Progress Notes (Signed)
EPIC Encounter for ICM Monitoring  Patient Name: [name] is a 79 y.o. female Date: 07/31/2019 Primary Care Physican: Rutherford Guys, MD Primary Cardiologist:Berry Electrophysiologist: Vergie Living Pacing: >99% 5/17/2021Office Weight:148lbs  Attempted call to patient and unable to reach.  Left detailed message per DPR regarding transmission. Transmission reviewed.   [place] thoracic impedancenormal  Prescribed:  Furosemide 40 mgontake 2 tablets (80 mg total) every morning and 1 tablet (40 mg total) every evening.  Potassium 20 mEqOnMonday, Wednesday AND Friday TAKE 1 TAB BY MOUTH 2 TIMES A DAY and on the days you take lasix.  Labs: 07/03/2019 Creatinine 1.22, BUN 16, Potassium 4.1, Sodium 144, GFR 43-49 05/14/2019 Creatinine 1.31, BUN 19, Potassium 5.4, Sodium 143, GFR 39-45  04/17/2019 Creatinine 1.22, BUN 16, Potassium 4.3, Sodium 143, GFR 43-49 A complete set of results can be found in Results Review.  Recommendations:Left voice mail with ICM number and encouraged to call if experiencing any fluid symptoms.  Follow-up plan: ICM clinic phone appointment on6/28/2021. 91 day device clinic remote transmission6/09/2019.   Copy of ICM check sent to [place]  3 month ICM trend: 07/30/2019    1 Year ICM trend:       Allison Billings, RN 07/31/2019 9:44 AM

## 2019-08-07 DIAGNOSIS — R69 Illness, unspecified: Secondary | ICD-10-CM | POA: Diagnosis not present

## 2019-08-08 DIAGNOSIS — D044 Carcinoma in situ of skin of scalp and neck: Secondary | ICD-10-CM | POA: Diagnosis not present

## 2019-08-08 DIAGNOSIS — L821 Other seborrheic keratosis: Secondary | ICD-10-CM | POA: Diagnosis not present

## 2019-08-08 DIAGNOSIS — H61001 Unspecified perichondritis of right external ear: Secondary | ICD-10-CM | POA: Diagnosis not present

## 2019-08-12 ENCOUNTER — Ambulatory Visit: Payer: Medicare HMO

## 2019-08-13 ENCOUNTER — Telehealth: Payer: Self-pay

## 2019-08-13 ENCOUNTER — Ambulatory Visit: Payer: Medicare HMO | Admitting: Family Medicine

## 2019-08-13 NOTE — Telephone Encounter (Signed)
Left message for patient to remind of missed remote transmission.  

## 2019-08-18 LAB — CUP PACEART REMOTE DEVICE CHECK
Battery Remaining Longevity: 31 mo
Battery Remaining Percentage: 33 %
Battery Voltage: 2.87 V
Brady Statistic AP VP Percent: 14 %
Brady Statistic AP VS Percent: 1 %
Brady Statistic AS VP Percent: 85 %
Brady Statistic AS VS Percent: 1 %
Brady Statistic RA Percent Paced: 14 %
Date Time Interrogation Session: 20210612050858
HighPow Impedance: 72 Ohm
HighPow Impedance: 72 Ohm
Implantable Lead Implant Date: 20160406
Implantable Lead Implant Date: 20160406
Implantable Lead Implant Date: 20160406
Implantable Lead Location: 753858
Implantable Lead Location: 753859
Implantable Lead Location: 753860
Implantable Lead Model: 7122
Implantable Pulse Generator Implant Date: 20160406
Lead Channel Impedance Value: 1375 Ohm
Lead Channel Impedance Value: 360 Ohm
Lead Channel Impedance Value: 630 Ohm
Lead Channel Pacing Threshold Amplitude: 0.75 V
Lead Channel Pacing Threshold Amplitude: 0.875 V
Lead Channel Pacing Threshold Amplitude: 0.875 V
Lead Channel Pacing Threshold Pulse Width: 0.5 ms
Lead Channel Pacing Threshold Pulse Width: 0.5 ms
Lead Channel Pacing Threshold Pulse Width: 0.5 ms
Lead Channel Sensing Intrinsic Amplitude: 12 mV
Lead Channel Sensing Intrinsic Amplitude: 5 mV
Lead Channel Setting Pacing Amplitude: 1.75 V
Lead Channel Setting Pacing Amplitude: 2 V
Lead Channel Setting Pacing Amplitude: 2 V
Lead Channel Setting Pacing Pulse Width: 0.5 ms
Lead Channel Setting Pacing Pulse Width: 0.5 ms
Lead Channel Setting Sensing Sensitivity: 0.5 mV
Pulse Gen Serial Number: 7199559

## 2019-09-02 ENCOUNTER — Ambulatory Visit (INDEPENDENT_AMBULATORY_CARE_PROVIDER_SITE_OTHER): Payer: Medicare HMO

## 2019-09-02 DIAGNOSIS — D044 Carcinoma in situ of skin of scalp and neck: Secondary | ICD-10-CM | POA: Diagnosis not present

## 2019-09-02 DIAGNOSIS — I5022 Chronic systolic (congestive) heart failure: Secondary | ICD-10-CM

## 2019-09-02 DIAGNOSIS — Z95810 Presence of automatic (implantable) cardiac defibrillator: Secondary | ICD-10-CM

## 2019-09-02 DIAGNOSIS — L988 Other specified disorders of the skin and subcutaneous tissue: Secondary | ICD-10-CM | POA: Diagnosis not present

## 2019-09-03 ENCOUNTER — Ambulatory Visit: Payer: Medicare HMO | Admitting: Family Medicine

## 2019-09-04 ENCOUNTER — Telehealth: Payer: Self-pay

## 2019-09-04 NOTE — Progress Notes (Signed)
EPIC Encounter for ICM Monitoring  Patient Name: [name] is a 79 y.o. female Date: 09/04/2019 Primary Care Physican: Rutherford Guys, MD Primary Cardiologist:Berry Electrophysiologist: Vergie Living Pacing: >99% 5/17/2021Office Weight:148lbs  Attempted call to patient and unable to reach.  Left detailed message per DPR regarding transmission. Transmission reviewed.    [place] thoracic impedancenormal  Prescribed:  Furosemide 40 mgontake 2 tablets (80 mg total) every morning and 1 tablet (40 mg total) every evening.  Potassium 20 mEqOnMonday, Wednesday AND Friday TAKE 1 TAB BY MOUTH 2 TIMES A DAY and on the days you take lasix.  Labs: 07/03/2019 Creatinine 1.22, BUN 16, Potassium 4.1, Sodium 144, GFR 43-49 05/14/2019 Creatinine 1.31, BUN 19, Potassium 5.4, Sodium 143, GFR 39-45  04/17/2019 Creatinine 1.22, BUN 16, Potassium 4.3, Sodium 143, GFR 43-49 A complete set of results can be found in Results Review.  Recommendations:Left voice mail with ICM number and encouraged to call if experiencing any fluid symptoms.  Follow-up plan: ICM clinic phone appointment on8/04/2019. 91 day device clinic remote transmission 09/16/2019.   EP/Cardiology Office Visits:  Due to schedule with Dr Caryl Comes (last OV 09/17/2018)    Recall 05/05/2020 with Dr. Gwenlyn Found.   Copy of ICM check sent to Dr. Caryl Comes.   3 month ICM trend: 09/02/2019    1 Year ICM trend:       [name], RN 09/04/2019 9:41 AM

## 2019-09-04 NOTE — Telephone Encounter (Signed)
Remote ICM transmission received.  Attempted call to patient regarding ICM remote transmission and left detailed message per DPR.  Advised to return call for any fluid symptoms or questions. Next ICM remote transmission scheduled 10/07/2019.

## 2019-09-06 ENCOUNTER — Encounter: Payer: Self-pay | Admitting: Family Medicine

## 2019-09-06 ENCOUNTER — Telehealth (INDEPENDENT_AMBULATORY_CARE_PROVIDER_SITE_OTHER): Payer: Medicare HMO | Admitting: Family Medicine

## 2019-09-06 ENCOUNTER — Other Ambulatory Visit: Payer: Self-pay

## 2019-09-06 DIAGNOSIS — C4490 Unspecified malignant neoplasm of skin, unspecified: Secondary | ICD-10-CM | POA: Diagnosis not present

## 2019-09-06 NOTE — Progress Notes (Signed)
Virtual Visit Note  I connected with patient on 09/06/19 at 526pm by phone (per patient preference) and verified that I am speaking with the correct person using two identifiers. [name] is currently located at home and patient is currently with them during visit. The provider, Rutherford Guys, MD is located in their office at time of visit.  I discussed the limitations, risks, security and privacy concerns of performing an evaluation and management service by telephone and the availability of in person appointments. I also discussed with the patient that there may be a patient responsible charge related to this service. The patient expressed understanding and agreed to proceed.   I provided 6 minutes of non-face-to-face time during this encounter.  Chief Complaint  Patient presents with  . Post-op Follow-up    justi has cancerous lesion removed from neck, going to have stitches taken out on Monday. Feeling very well. No pain. Just wanted dto make you aware. No refills needed    HPI ? Last OV may 2021 - neck lesion concerning for skin cancer, referred to derm  She had skin lesion removed Will go back next week for suture removal  Patient reports path showed non melanoma skin cancer with negative margins No further treatment needed She is otherwise is doing really well Has been working/enjoying her garden a lot  Allergies  Allergen Reactions  . Valium [Diazepam] Swelling    face    Prior to Admission medications   Medication Sig Start Date End Date Taking? Authorizing Provider  ALPRAZolam Duanne Moron) 1 MG tablet Take 1 mg by mouth daily as needed. 04/25/18  Yes [provider]  Ascorbic Acid (VITAMIN C) 100 MG tablet Take 100 mg by mouth daily.   Yes [provider]  aspirin EC 81 MG tablet Take 81 mg by mouth at bedtime.   Yes [provider]  blood glucose meter kit and supplies KIT Dispense based on patient and insurance preference. Use three  times a day as directed. Dx. E11.65, Z79.4 05/16/19  Yes Rutherford Guys, MD  carvedilol (COREG) 12.5 MG tablet TAKE 1 1/2 TABLET BY MOUTH TWICE A DAY 03/13/19  Yes Deboraha Sprang, MD  citalopram (CELEXA) 20 MG tablet Take 20 mg by mouth daily. 12/08/18  Yes [provider]  clopidogrel (PLAVIX) 75 MG tablet Take 1 tablet by mouth once daily 02/15/19  Yes Lorretta Harp, MD  furosemide (LASIX) 40 MG tablet Take 2 tablets (80 mg total) by mouth every morning AND 1 tablet (40 mg total) every evening. 80 MG IN THE AM AND 40MG IN THE PM.. 07/22/19  Yes Cleaver, Jossie Ng, NP  insulin aspart (NOVOLOG FLEXPEN) 100 UNIT/ML FlexPen Per insulin sliding scale, max TTD, 20 units. 06/26/17  Yes Rutherford Guys, MD  Insulin Detemir (LEVEMIR) 100 UNIT/ML Pen Inject 16 Units into the skin daily at 10 pm. 09/04/18  Yes Rutherford Guys, MD  Insulin Pen Needle (BD PEN NEEDLE MICRO U/F) 32G X 6 MM MISC USE NEW NEEDLE FOR EACH INJECTION OF INSULIN, FOUR TIMES DAILY 04/30/19  Yes Rutherford Guys, MD  ipratropium (ATROVENT) 0.03 % nasal spray Place 2 sprays into both nostrils 2 (two) times daily. 05/23/19  Yes Rutherford Guys, MD  levothyroxine (SYNTHROID) 88 MCG tablet TAKE 1 TABLET BY MOUTH AT BEDTIME 06/12/19  Yes Rutherford Guys, MD  mirtazapine (REMERON) 30 MG tablet Take 1 tablet (30 mg total) by mouth at bedtime. 12/27/18  Yes Pamella Pert,  Lilia Argue, MD  Moringa Oleifera (MORINGA PO) Take 5,000 mg by mouth.   Yes [provider]  mupirocin ointment (BACTROBAN) 2 % Apply 1 application topically 3 (three) times daily. 07/29/19  Yes Rutherford Guys, MD  nitroGLYCERIN (NITROSTAT) 0.4 MG SL tablet Place 1 tablet (0.4 mg total) under the tongue every 5 (five) minutes as needed for chest pain. 05/06/19  Yes Kilroy, Luke K, PA-C  ONE TOUCH ULTRA TEST test strip  06/07/17  Yes [provider]  Jonetta Speak LANCETS 15V [place]  06/07/17  Yes [provider]  oxyCODONE-acetaminophen (PERCOCET/ROXICET)  5-325 MG tablet Take by mouth every 4 (four) hours as needed for severe pain.   Yes [provider]  potassium chloride SA (KLOR-CON) 20 MEQ tablet TAKE 1 TAB BY MOUTH 2 TIMES A DAY ON Monday, Wednesday AND Friday  (days you take Lasix) 05/21/19  Yes Kilroy, Luke K, PA-C  simvastatin (ZOCOR) 20 MG tablet TAKE 1 TABLET BY MOUTH ONCE DAILY WITH SUPPER 06/19/19  Yes Rutherford Guys, MD  triamcinolone cream (KENALOG) 0.1 % APPLY ONE APPLICATION TOPICALLY TWO TIMES DAILY 11/28/18  Yes Rutherford Guys, MD    Past Medical History:  Diagnosis Date  . Anxiety   . Arthritis   . Chronic pain    a. Prior h/o chronic pain on methadone.  . Chronic systolic CHF (congestive heart failure) (HCC)    a. mixed ischemic/non-ischemic cardiomyopathy. b. s/p CRT-D in 06/2014.  Marland Kitchen CKD (chronic kidney disease), stage III   . Complication of anesthesia    hard time waking her up from general surgery  . Coronary artery disease    a. remote RCA stenting in 2008 with non-DES. b. Cath 06/2014 following abnormal nuc: Stable, unchanged from prior cath, patent stent and 40% LM.  . Diabetes mellitus ([place])   . GERD (gastroesophageal reflux disease)   . Headache   . Hyperlipidemia   . Hypertension   . LBBB (left bundle branch block)   . Nonischemic cardiomyopathy ([place])   . OSA (obstructive sleep apnea)    AHI-9.77/hr, during REM-50.32/hr  . Tobacco abuse     Past Surgical History:  Procedure Laterality Date  . BACK SURGERY  2013  . BI-VENTRICULAR IMPLANTABLE CARDIOVERTER DEFIBRILLATOR N/A 06/11/2014   STJ CRTD implanted by Dr Caryl Comes  . CARDIAC CATHETERIZATION  12/14/2006   RCA stented with a 3.0 [employer] Liberte stent resulting in a reduction of 75% to 0% residual  . CHOLECYSTECTOMY     20 years ago  . COLONOSCOPY WITH PROPOFOL N/A 12/15/2014   Procedure: COLONOSCOPY WITH PROPOFOL;  Surgeon: Clarene Essex, MD;  Location: [facility] ENDOSCOPY;  Service: Endoscopy;  Laterality: N/A;  . EYE SURGERY     bilateral  cataract surgery   . LEFT AND RIGHT HEART CATHETERIZATION WITH CORONARY ANGIOGRAM N/A 05/29/2014   Procedure: LEFT AND RIGHT HEART CATHETERIZATION WITH CORONARY ANGIOGRAM;  Surgeon: Lorretta Harp, MD;  Location: [facility] CATH LAB;  Service: Cardiovascular;  Laterality: N/A;  . LEFT HEART CATHETERIZATION WITH CORONARY ANGIOGRAM N/A 12/27/2012   Procedure: LEFT HEART CATHETERIZATION WITH CORONARY ANGIOGRAM;  Surgeon: Lorretta Harp, MD;  Location: [facility] CATH LAB;  Service: Cardiovascular;  Laterality: N/A;    Social History   Tobacco Use  . Smoking status: Current Every Day Smoker    Packs/day: 2.00    Years: 58.00    Pack years: 116.00    Types: Cigarettes    Start date: 12/26/1952    Last attempt to  quit: 12/05/2013    Years since quitting: 5.7  . Smokeless tobacco: Never Used  . Tobacco comment: uses Vape cigarettes  Substance Use Topics  . Alcohol use: No    Alcohol/week: 0.0 standard drinks    Family History  Problem Relation Age of Onset  . Stroke Mother   . Hypertension Mother   . Coronary artery disease Father   . Stroke Brother   . Heart disease Brother   . Other Brother        H1N1 VIRUS  . Healthy Sister   . Healthy Sister     ROS Per hpi  Objective  Vitals as reported by the patient: none    ASSESSMENT and PLAN  1. Non-melanoma skin cancer Treated by derm.   FOLLOW-UP: sept for DM   The above assessment and management plan was discussed with the patient. The patient verbalized understanding of and has agreed to the management plan. Patient is aware to call the clinic if symptoms persist or worsen. Patient is aware when to return to the clinic for a follow-up visit. Patient educated on when it is appropriate to go to the emergency department.     Rutherford Guys, MD Primary Care at [place], [place] [address] Ph.  [phone] Fax [phone]

## 2019-09-16 ENCOUNTER — Other Ambulatory Visit: Payer: Self-pay | Admitting: Family Medicine

## 2019-09-16 ENCOUNTER — Ambulatory Visit (INDEPENDENT_AMBULATORY_CARE_PROVIDER_SITE_OTHER): Payer: Medicare HMO | Admitting: *Deleted

## 2019-09-16 DIAGNOSIS — I428 Other cardiomyopathies: Secondary | ICD-10-CM

## 2019-09-18 LAB — CUP PACEART REMOTE DEVICE CHECK
Battery Remaining Longevity: 30 mo
Battery Remaining Percentage: 32 %
Battery Voltage: 2.87 V
Brady Statistic AP VP Percent: 14 %
Brady Statistic AP VS Percent: 1 %
Brady Statistic AS VP Percent: 85 %
Brady Statistic AS VS Percent: 1 %
Brady Statistic RA Percent Paced: 14 %
Date Time Interrogation Session: 20210713130507
HighPow Impedance: 72 Ohm
HighPow Impedance: 72 Ohm
Implantable Lead Implant Date: 20160406
Implantable Lead Implant Date: 20160406
Implantable Lead Implant Date: 20160406
Implantable Lead Location: 753858
Implantable Lead Location: 753859
Implantable Lead Location: 753860
Implantable Lead Model: 7122
Implantable Pulse Generator Implant Date: 20160406
Lead Channel Impedance Value: 1250 Ohm
Lead Channel Impedance Value: 350 Ohm
Lead Channel Impedance Value: 630 Ohm
Lead Channel Pacing Threshold Amplitude: 0.75 V
Lead Channel Pacing Threshold Amplitude: 0.75 V
Lead Channel Pacing Threshold Amplitude: 0.75 V
Lead Channel Pacing Threshold Pulse Width: 0.5 ms
Lead Channel Pacing Threshold Pulse Width: 0.5 ms
Lead Channel Pacing Threshold Pulse Width: 0.5 ms
Lead Channel Sensing Intrinsic Amplitude: 12 mV
Lead Channel Sensing Intrinsic Amplitude: 4.8 mV
Lead Channel Setting Pacing Amplitude: 1.75 V
Lead Channel Setting Pacing Amplitude: 2 V
Lead Channel Setting Pacing Amplitude: 2 V
Lead Channel Setting Pacing Pulse Width: 0.5 ms
Lead Channel Setting Pacing Pulse Width: 0.5 ms
Lead Channel Setting Sensing Sensitivity: 0.5 mV
Pulse Gen Serial Number: 7199559

## 2019-09-18 NOTE — Progress Notes (Signed)
Remote ICD transmission.   

## 2019-10-03 ENCOUNTER — Telehealth: Payer: Self-pay | Admitting: Internal Medicine

## 2019-10-03 NOTE — Telephone Encounter (Signed)
Consulted DR [employer]. Report given on patient. Patient contacted and advised that DR Caryl Comes will evaluate situation during video visit scheduled for 10/31/19. Patient is to contact the device clinic if she develops any edema, redness, increased pain, or drainage at ICD site or a fever or chills. She was unable to send remote transmission due to issue with remote monitor. Provided # for tech services to troubleshoot monitor.

## 2019-10-03 NOTE — Telephone Encounter (Signed)
Reasonable  Thanks SK

## 2019-10-03 NOTE — Telephone Encounter (Addendum)
Patient reports pain at left edge of her icd when she reaches with her left arm to do different activities. She reports no edema, no drainage, no redness. She reports her ICD has "moved" slightly since implant and she has lost 75 lbs over the past year. She reports pain is with activityShe takes oxycodone for chronic back pain and that does not relieve pain. No pain at rest at ICD site. Patient will send remote transmission.

## 2019-10-03 NOTE — Telephone Encounter (Signed)
Per pt call stated her defibrillator hurts and is moving around.  Pt would like a call back on what to do.

## 2019-10-04 ENCOUNTER — Telehealth: Payer: Self-pay | Admitting: Cardiovascular Disease

## 2019-10-04 NOTE — Telephone Encounter (Signed)
We did not call her today.

## 2019-10-04 NOTE — Telephone Encounter (Signed)
New Message  Pt says she is returning a call

## 2019-10-04 NOTE — Telephone Encounter (Signed)
Returned the call to the patient. Did not see anything in Epic where someone had reached out to her.

## 2019-10-07 ENCOUNTER — Ambulatory Visit (INDEPENDENT_AMBULATORY_CARE_PROVIDER_SITE_OTHER): Payer: Medicare HMO

## 2019-10-07 DIAGNOSIS — I70203 Unspecified atherosclerosis of native arteries of extremities, bilateral legs: Secondary | ICD-10-CM | POA: Diagnosis not present

## 2019-10-07 DIAGNOSIS — R69 Illness, unspecified: Secondary | ICD-10-CM | POA: Diagnosis not present

## 2019-10-07 DIAGNOSIS — Z95810 Presence of automatic (implantable) cardiac defibrillator: Secondary | ICD-10-CM

## 2019-10-07 DIAGNOSIS — I5022 Chronic systolic (congestive) heart failure: Secondary | ICD-10-CM | POA: Diagnosis not present

## 2019-10-07 DIAGNOSIS — M19072 Primary osteoarthritis, left ankle and foot: Secondary | ICD-10-CM | POA: Diagnosis not present

## 2019-10-08 ENCOUNTER — Ambulatory Visit
Admission: RE | Admit: 2019-10-08 | Discharge: 2019-10-08 | Disposition: A | Discharge status: Home / Self Care | Payer: Medicare HMO | Source: Ambulatory Visit | Attending: Gastroenterology | Admitting: Gastroenterology

## 2019-10-08 ENCOUNTER — Other Ambulatory Visit: Payer: Self-pay | Admitting: Gastroenterology

## 2019-10-08 ENCOUNTER — Telehealth: Payer: Self-pay | Admitting: Cardiovascular Disease

## 2019-10-08 ENCOUNTER — Other Ambulatory Visit: Payer: Self-pay

## 2019-10-08 DIAGNOSIS — R197 Diarrhea, unspecified: Secondary | ICD-10-CM

## 2019-10-08 DIAGNOSIS — R69 Illness, unspecified: Secondary | ICD-10-CM | POA: Diagnosis not present

## 2019-10-08 NOTE — Progress Notes (Signed)
EPIC Encounter for ICM Monitoring  Patient Name: [name] is a 79 y.o. female Date: 10/08/2019 Primary Care Physican: Rutherford Guys, MD Primary Cardiologist:Berry Electrophysiologist: Vergie Living Pacing: >99% 5/17/2021OfficeWeight:148lbs  Transmission reviewed.   Corvue thoracic impedancenormal  Prescribed:  Furosemide 40 mgontake 2 tablets (80 mg total) every morning and 1 tablet (40 mg total) every evening.  Potassium 20 mEqOnMonday, Wednesday AND Friday TAKE 1 TAB BY MOUTH 2 TIMES A DAYand on the days you take lasix.  Labs: 07/03/2019 Creatinine1.22Dorothyann Peng, Potassium4.1, [id number], [id number] 05/14/2019 Creatinine1.31, BUN19, Potassium5.4, [id number], [id number] 04/17/2019 Creatinine 1.22, BUN 16, Potassium 4.3, Sodium 143, GFR 43-49 A complete set of results can be found in Results Review.  Recommendations:None  Follow-up plan: ICM clinic phone appointment on9/09/2019. 91 day device clinic remote transmission 12/18/2019.   EP/Cardiology Office Visits:  10/31/2019 with Dr Caryl Comes.   Recall 05/05/2020 with Dr. Gwenlyn Found.   Copy of ICM check sent to Dr. Caryl Comes.    3 month ICM trend: 10/07/2019    1 Year ICM trend:       [name], RN 10/08/2019 4:05 PM

## 2019-10-08 NOTE — Telephone Encounter (Signed)
Need to know how many teeth, local vs general anesthesia

## 2019-10-08 NOTE — Telephone Encounter (Signed)
Spoke with Legrand Como at Dr. Dennard Nip office.  She confirmed that it is 1 single extraction with local anesthesia.

## 2019-10-08 NOTE — Telephone Encounter (Signed)
   Primary Cardiologist: Quay Burow, MD  Chart reviewed as part of pre-operative protocol coverage.   Simple dental extractions are considered low risk procedures per guidelines and generally do not require any specific cardiac clearance. It is also generally accepted that for simple extractions and dental cleanings, there is no need to interrupt blood thinner therapy.  SBE prophylaxis is not required for the patient.  I will route this recommendation to the requesting party via Epic fax function and remove from pre-op pool.  Please call with questions.  [place], [place] 10/08/2019, 5:20 PM

## 2019-10-08 NOTE — Telephone Encounter (Signed)
1. What dental office are you calling from? Dr Levin Erp   2. What is your office phone number?  [phone]   3. What is your fax number? [phone]  4. What type of procedure is the patient having performed? Tooth extraction   5. What date is procedure scheduled or is the patient there now? Pending*  (if the patient is at the dentist's office question goes to their cardiologist if he/she is in the office.  If not, question should go to the DOD).   6. What is your question (ex. Antibiotics prior to procedure, holding medication-we need to know how long dentist wants pt to hold med)?Does pt hold Plavix? If so when and for how long

## 2019-10-10 ENCOUNTER — Telehealth: Payer: Self-pay | Admitting: Cardiovascular Disease

## 2019-10-10 NOTE — Telephone Encounter (Signed)
   Primary Cardiologist: Quay Burow, MD  Chart reviewed as part of pre-operative protocol coverage. Simple dental extractions are considered low risk procedures per guidelines and generally do not require any specific cardiac clearance. It is also generally accepted that for simple extractions and dental cleanings, there is no need to interrupt blood thinner therapy.   SBE prophylaxis is not required for the patient.  I will route this recommendation to the requesting party via Epic fax function and remove from pre-op pool.  Please call with questions.  Deberah Pelton, NP 10/10/2019, 12:37 PM

## 2019-10-10 NOTE — Telephone Encounter (Signed)
Follow Up:    [name] is checking on the status of pt's clearance

## 2019-10-10 NOTE — Telephone Encounter (Signed)
° ° °  Tanya from Dr. Dennard Nip office. She said their fax is not working and she is wondering if clearance can be e-mail to them she gave [email or web address]

## 2019-10-10 NOTE — Telephone Encounter (Signed)
Called Tanya back with Dr. Dennard Nip office back to let her know that I was unable to scan and email a copy of the clearance to her.   She advised me that they was working on her fax machine and that it should be fine but appreciated me getting back with her.

## 2019-10-11 ENCOUNTER — Encounter: Payer: Self-pay | Admitting: Family Medicine

## 2019-10-11 ENCOUNTER — Other Ambulatory Visit: Payer: Self-pay

## 2019-10-11 ENCOUNTER — Ambulatory Visit (INDEPENDENT_AMBULATORY_CARE_PROVIDER_SITE_OTHER): Payer: Medicare HMO | Admitting: Family Medicine

## 2019-10-11 VITALS — BP 109/66 | HR 66 | Temp 98.3°F | Ht 65.0 in | Wt 148.0 lb

## 2019-10-11 DIAGNOSIS — E119 Type 2 diabetes mellitus without complications: Secondary | ICD-10-CM | POA: Diagnosis not present

## 2019-10-11 DIAGNOSIS — E039 Hypothyroidism, unspecified: Secondary | ICD-10-CM | POA: Diagnosis not present

## 2019-10-11 DIAGNOSIS — Z794 Long term (current) use of insulin: Secondary | ICD-10-CM

## 2019-10-11 DIAGNOSIS — R202 Paresthesia of skin: Secondary | ICD-10-CM

## 2019-10-11 DIAGNOSIS — E785 Hyperlipidemia, unspecified: Secondary | ICD-10-CM

## 2019-10-11 DIAGNOSIS — R252 Cramp and spasm: Secondary | ICD-10-CM

## 2019-10-11 DIAGNOSIS — G2581 Restless legs syndrome: Secondary | ICD-10-CM

## 2019-10-11 MED ORDER — BLOOD GLUCOSE MONITOR KIT: PACK | 11 refills | Status: DC

## 2019-10-11 MED ORDER — ROPINIROLE HCL 0.5 MG PO TABS: 0.5000 mg | ORAL_TABLET | Freq: Every day | ORAL | 3 refills | Status: DC

## 2019-10-11 NOTE — Progress Notes (Signed)
8/6/20212:22 PM  [name] [birth year], 79 y.o., female [id number]  Chief Complaint  Patient presents with  . Pain    pain in the legs and feet for a while now, causing sleep deprivation.   . Glucose meter    replacement needed    HPI:   Patient is a 79 y.o. female with past medical history significant for DM2 on insulin, osteomyelitis, CKD 3, CAD, CHF, HTN, HLP, OSA not on cpap, depression and anxiety who presents today for pain in her feet/legs  Having horrible cramps for past 2-3 weeks, getting worse, having multiple episodes each day.  She takes KCL daily Has not increased her water pill of recent She is on fluid restriction per cards, but this has not been changed For past several months she feels her neuropathy has worsened Used to be numb upto her ankles, now upto her mid calves She denies any claudication Lost her glucometer, has not been able to check her cbgs for past several weeks She continues to do her insulin (levemir) Her RLS has also been worsening for past several weeks   Depression screen [facility] 2/9 06/04/2019 04/15/2019 04/11/2019  Decreased Interest 0 3 0  Down, Depressed, Hopeless 0 3 0  PHQ - 2 Score 0 6 0  Altered sleeping 0 3 -  Tired, decreased energy 0 3 -  Change in appetite 0 3 -  Feeling bad or failure about yourself  - - -  Trouble concentrating - - -  Moving slowly or fidgety/restless - - -  Suicidal thoughts - - -  PHQ-9 Score 0 15 -  Difficult doing work/chores - - -  Some recent data might be hidden    Fall Risk  07/29/2019 06/04/2019 04/11/2019 03/04/2019 12/13/2018  Falls in the past year? 0 0 0 0 0  Number falls in past yr: 0 0 0 0 0  Injury with Fall? 0 0 0 0 0  Risk for fall due to : - - - Impaired mobility -  Follow up - Falls evaluation completed Falls evaluation completed;Education provided - -     Allergies  Allergen Reactions  . Valium [Diazepam] Swelling    face    Prior to Admission medications   Medication Sig Start  Date End Date Taking? Authorizing Provider  aspirin EC 81 MG tablet Take 81 mg by mouth at bedtime.   Yes [provider]  blood glucose meter kit and supplies KIT Dispense based on patient and insurance preference. Use three times a day as directed. Dx. E11.65, Z79.4 05/16/19  Yes Rutherford Guys, MD  carvedilol (COREG) 12.5 MG tablet TAKE 1 1/2 TABLET BY MOUTH TWICE A DAY 03/13/19  Yes Deboraha Sprang, MD  clopidogrel (PLAVIX) 75 MG tablet Take 1 tablet by mouth once daily 02/15/19  Yes Lorretta Harp, MD  furosemide (LASIX) 40 MG tablet Take 2 tablets (80 mg total) by mouth every morning AND 1 tablet (40 mg total) every evening. 80 MG IN THE AM AND 40MG IN THE PM.. 07/22/19  Yes Cleaver, Jossie Ng, NP  insulin aspart (NOVOLOG FLEXPEN) 100 UNIT/ML FlexPen Per insulin sliding scale, max TTD, 20 units. 06/26/17  Yes Rutherford Guys, MD  Insulin Detemir (LEVEMIR) 100 UNIT/ML Pen Inject 16 Units into the skin daily at 10 pm. 09/04/18  Yes Rutherford Guys, MD  Insulin Pen Needle (BD PEN NEEDLE MICRO U/F) 32G X 6 MM MISC USE NEW NEEDLE FOR EACH INJECTION OF INSULIN, FOUR TIMES  DAILY 04/30/19  Yes Rutherford Guys, MD  ipratropium (ATROVENT) 0.03 % nasal spray Place 2 sprays into both nostrils 2 (two) times daily. 05/23/19  Yes Rutherford Guys, MD  levothyroxine (SYNTHROID) 88 MCG tablet TAKE 1 TABLET BY MOUTH ONCE DAILY AT BEDTIME 09/16/19  Yes Rutherford Guys, MD  mupirocin ointment (BACTROBAN) 2 % Apply 1 application topically 3 (three) times daily. 07/29/19  Yes Rutherford Guys, MD  nitroGLYCERIN (NITROSTAT) 0.4 MG SL tablet Place 1 tablet (0.4 mg total) under the tongue every 5 (five) minutes as needed for chest pain. 05/06/19  Yes Kilroy, Luke K, PA-C  ONE TOUCH ULTRA TEST test strip  06/07/17  Yes [provider]  Jonetta Speak LANCETS 15V [place]  06/07/17  Yes [provider]  oxyCODONE-acetaminophen (PERCOCET/ROXICET) 5-325 MG tablet Take by mouth every 4 (four) hours as needed  for severe pain.   Yes [provider]  potassium chloride SA (KLOR-CON) 20 MEQ tablet TAKE 1 TAB BY MOUTH 2 TIMES A DAY ON Monday, Wednesday AND Friday  (days you take Lasix) 05/21/19  Yes Kilroy, Luke K, PA-C  simvastatin (ZOCOR) 20 MG tablet TAKE 1 TABLET BY MOUTH ONCE DAILY WITH SUPPER 06/19/19  Yes Rutherford Guys, MD  triamcinolone cream (KENALOG) 0.1 % APPLY ONE APPLICATION TOPICALLY TWO TIMES DAILY 11/28/18  Yes Rutherford Guys, MD    Past Medical History:  Diagnosis Date  . Anxiety   . Arthritis   . Chronic pain    a. Prior h/o chronic pain on methadone.  . Chronic systolic CHF (congestive heart failure) (HCC)    a. mixed ischemic/non-ischemic cardiomyopathy. b. s/p CRT-D in 06/2014.  Marland Kitchen CKD (chronic kidney disease), stage III   . Complication of anesthesia    hard time waking her up from general surgery  . Coronary artery disease    a. remote RCA stenting in 2008 with non-DES. b. Cath 06/2014 following abnormal nuc: Stable, unchanged from prior cath, patent stent and 40% LM.  . Diabetes mellitus ([place])   . GERD (gastroesophageal reflux disease)   . Headache   . Hyperlipidemia   . Hypertension   . LBBB (left bundle branch block)   . Nonischemic cardiomyopathy ([place])   . OSA (obstructive sleep apnea)    AHI-9.77/hr, during REM-50.32/hr  . Tobacco abuse     Past Surgical History:  Procedure Laterality Date  . BACK SURGERY  2013  . BI-VENTRICULAR IMPLANTABLE CARDIOVERTER DEFIBRILLATOR N/A 06/11/2014   STJ CRTD implanted by Dr Caryl Comes  . CARDIAC CATHETERIZATION  12/14/2006   RCA stented with a 3.0 [employer] Liberte stent resulting in a reduction of 75% to 0% residual  . CHOLECYSTECTOMY     20 years ago  . COLONOSCOPY WITH PROPOFOL N/A 12/15/2014   Procedure: COLONOSCOPY WITH PROPOFOL;  Surgeon: Clarene Essex, MD;  Location: [facility];  Service: Endoscopy;  Laterality: N/A;  . EYE SURGERY     bilateral cataract surgery   . LEFT AND RIGHT HEART CATHETERIZATION  WITH CORONARY ANGIOGRAM N/A 05/29/2014   Procedure: LEFT AND RIGHT HEART CATHETERIZATION WITH CORONARY ANGIOGRAM;  Surgeon: Lorretta Harp, MD;  Location: [facility] CATH LAB;  Service: Cardiovascular;  Laterality: N/A;  . LEFT HEART CATHETERIZATION WITH CORONARY ANGIOGRAM N/A 12/27/2012   Procedure: LEFT HEART CATHETERIZATION WITH CORONARY ANGIOGRAM;  Surgeon: Lorretta Harp, MD;  Location: [facility] CATH LAB;  Service: Cardiovascular;  Laterality: N/A;    Social History   Tobacco Use  . Smoking status: Current Every  Day Smoker    Packs/day: 2.00    Years: 58.00    Pack years: 116.00    Types: Cigarettes    Start date: 12/26/1952    Last attempt to quit: 12/05/2013    Years since quitting: 5.8  . Smokeless tobacco: Never Used  . Tobacco comment: uses Vape cigarettes  Substance Use Topics  . Alcohol use: No    Alcohol/week: 0.0 standard drinks    Family History  Problem Relation Age of Onset  . Stroke Mother   . Hypertension Mother   . Coronary artery disease Father   . Stroke Brother   . Heart disease Brother   . Other Brother        H1N1 VIRUS  . Healthy Sister   . Healthy Sister     Review of Systems  Constitutional: Negative for chills and fever.  Respiratory: Negative for cough and shortness of breath.   Cardiovascular: Negative for chest pain, palpitations and leg swelling.  Gastrointestinal: Negative for abdominal pain, nausea and vomiting.     OBJECTIVE:  Today's Vitals   10/11/19 1404  BP: 109/66  Pulse: 66  Temp: 98.3 F (36.8 C)  SpO2: 97%  Weight: 148 lb (67.1 kg)  Height: '5\' 5"'  (1.651 m)   Body mass index is 24.63 kg/m.   Physical Exam Vitals and nursing note reviewed.  Constitutional:      Appearance: She is well-developed.  HENT:     Head: Normocephalic and atraumatic.     Mouth/Throat:     Pharynx: No oropharyngeal exudate.  Eyes:     General: No scleral icterus.    Conjunctiva/sclera: Conjunctivae normal.     Pupils: Pupils are equal, round,  and reactive to light.  Cardiovascular:     Rate and Rhythm: Normal rate and regular rhythm.     Heart sounds: Normal heart sounds. No murmur heard.  No friction rub. No gallop.   Pulmonary:     Effort: Pulmonary effort is normal.     Breath sounds: Normal breath sounds. No wheezing or rales.  Musculoskeletal:     Cervical back: Neck supple.     Right lower leg: No edema.     Left lower leg: No edema.  Skin:    General: Skin is warm and dry.  Neurological:     Mental Status: She is alert and oriented to person, place, and time.    Diabetic Foot Exam - Simple   Simple Foot Form Diabetic Foot exam was performed with the following findings: Yes 10/11/2019  2:33 PM  Visual Inspection No deformities, no ulcerations, no other skin breakdown bilaterally: Yes Sensation Testing See comments: Yes Pulse Check See comments: Yes Comments Right foot normal monofilament, left decreased monofilament at point 10 Decreased pedal pulses      No results found for this or any previous visit (from the past 24 hour(s)).  No results found.   ASSESSMENT and PLAN  1. Muscle cramps Discussed supportive measures, mindful of fluid restriction for CHF. Labs pending - Magnesium - CK  2. Insulin dependent type 2 diabetes mellitus ([place]) Checking labs today, medications will be adjusted as needed.  - Comprehensive metabolic panel - Hemoglobin A1c  3. Hypothyroidism, unspecified type Checking labs today, medications will be adjusted as needed.  - TSH  4. Restless leg Trial of requip, reviewed r/se/b - Ferritin  5. Dyslipidemia, goal LDL below 70 Checking labs today, medications will be adjusted as needed.  - Lipid panel  6. Paresthesia of  both feet - Vitamin B12  Other orders - blood glucose meter kit and supplies KIT; Dispense based on patient and insurance preference. Use three times a day as directed. Dx. E11.65, Z79.4 - rOPINIRole (REQUIP) 0.5 MG tablet; Take 1 tablet (0.5 mg  total) by mouth at bedtime.  Return in about 4 weeks (around 11/08/2019) for RLS/cramps.    Rutherford Guys, MD Primary Care at [place], [place] [address] Ph.  [phone] Fax [phone]

## 2019-10-11 NOTE — Patient Instructions (Signed)
° ° ° °  If you have lab work done today you will be contacted with your lab results within the next 2 weeks.  If you have not heard from us then please contact us. The fastest way to get your results is to register for My Chart. ° ° °IF you received an x-ray today, you will receive an invoice from [facility]. Please contact [facility] at [phone] with questions or concerns regarding your invoice.  ° °IF you received labwork today, you will receive an invoice from [facility]. Please contact [facility] at [phone] with questions or concerns regarding your invoice.  ° °Our billing staff will not be able to assist you with questions regarding bills from these companies. ° °You will be contacted with the lab results as soon as they are available. The fastest way to get your results is to activate your My Chart account. Instructions are located on the last page of this paperwork. If you have not heard from us regarding the results in 2 weeks, please contact this office. °  ° ° ° °

## 2019-10-12 LAB — COMPREHENSIVE METABOLIC PANEL
ALT: 11 IU/L (ref 0–32)
AST: 15 IU/L (ref 0–40)
Albumin/Globulin Ratio: 2.3 — ABNORMAL HIGH (ref 1.2–2.2)
Albumin: 4.4 g/dL (ref 3.7–4.7)
Alkaline Phosphatase: 78 IU/L (ref 48–121)
BUN/Creatinine Ratio: 16 (ref 12–28)
BUN: 19 mg/dL (ref 8–27)
Bilirubin Total: 0.4 mg/dL (ref 0.0–1.2)
CO2: 26 mmol/L (ref 20–29)
Calcium: 9.7 mg/dL (ref 8.7–10.3)
Chloride: 101 mmol/L (ref 96–106)
Creatinine, Ser: 1.16 mg/dL — ABNORMAL HIGH (ref 0.57–1.00)
GFR calc Af Amer: 52 mL/min/{1.73_m2} — ABNORMAL LOW (ref >59)
GFR calc non Af Amer: 46 mL/min/{1.73_m2} — ABNORMAL LOW (ref >59)
Globulin, Total: 1.9 g/dL (ref 1.5–4.5)
Glucose: 113 mg/dL — ABNORMAL HIGH (ref 65–99)
Potassium: 4.6 mmol/L (ref 3.5–5.2)
Sodium: 141 mmol/L (ref 134–144)
Total Protein: 6.3 g/dL (ref 6.0–8.5)

## 2019-10-12 LAB — LIPID PANEL
Chol/HDL Ratio: 2.8 ratio (ref 0.0–4.4)
Cholesterol, Total: 150 mg/dL (ref 100–199)
HDL: 53 mg/dL (ref >39)
LDL Chol Calc (NIH): 78 mg/dL (ref 0–99)
Triglycerides: 107 mg/dL (ref 0–149)
VLDL Cholesterol Cal: 19 mg/dL (ref 5–40)

## 2019-10-12 LAB — HEMOGLOBIN A1C
Est. average glucose Bld gHb Est-mCnc: 131 mg/dL
Hgb A1c MFr Bld: 6.2 % — ABNORMAL HIGH (ref 4.8–5.6)

## 2019-10-12 LAB — FERRITIN: Ferritin: 88 ng/mL (ref 15–150)

## 2019-10-12 LAB — CK: Total CK: 84 U/L (ref 32–182)

## 2019-10-12 LAB — VITAMIN B12: Vitamin B-12: 373 pg/mL (ref 232–1245)

## 2019-10-12 LAB — MAGNESIUM: Magnesium: 2.3 mg/dL (ref 1.6–2.3)

## 2019-10-12 LAB — TSH: TSH: 2.53 u[IU]/mL (ref 0.450–4.500)

## 2019-10-14 ENCOUNTER — Telehealth: Payer: Self-pay | Admitting: Cardiovascular Disease

## 2019-10-14 NOTE — Telephone Encounter (Signed)
Spoke with Anderson Malta who will fax pre-op clearance to Dr. Randol Kern office.

## 2019-10-16 ENCOUNTER — Telehealth: Payer: Self-pay | Admitting: Family Medicine

## 2019-10-16 NOTE — Telephone Encounter (Signed)
Patient requesting lab results, please advise.  

## 2019-10-16 NOTE — Telephone Encounter (Signed)
Patient would like someone from clinical to call her regarding her lab results     [phone] or [phone]

## 2019-10-17 NOTE — Telephone Encounter (Signed)
.  mychart

## 2019-10-21 ENCOUNTER — Telehealth: Payer: Self-pay | Admitting: Internal Medicine

## 2019-10-21 NOTE — Telephone Encounter (Signed)
The patient is calling in because she has been experiencing "cramps all over her body" on and off for the past month. She saw her PCP last week and got blood work done that came back normal. She denies SOB, CP, N/V. She is concerned and would like Dr. Caryl Comes to advise. I recommended that the patient reach back out to her PCP to follow up since they have been following this issue of hers. Will send to [facility] as well.

## 2019-10-21 NOTE — Telephone Encounter (Signed)
New message:    Patient calling stating that she is having very bad cramps all over. Patient went to the doctor last week and they took blood work and states that the blood work was good. Patient stating the cramps are so bad. Please call patient back.

## 2019-10-22 ENCOUNTER — Other Ambulatory Visit: Payer: Self-pay

## 2019-10-22 ENCOUNTER — Telehealth (INDEPENDENT_AMBULATORY_CARE_PROVIDER_SITE_OTHER): Payer: Medicare HMO | Admitting: Family Medicine

## 2019-10-22 VITALS — BP 128/67 | Ht 65.0 in | Wt 145.0 lb

## 2019-10-22 DIAGNOSIS — I70203 Unspecified atherosclerosis of native arteries of extremities, bilateral legs: Secondary | ICD-10-CM | POA: Diagnosis not present

## 2019-10-22 DIAGNOSIS — R69 Illness, unspecified: Secondary | ICD-10-CM | POA: Diagnosis not present

## 2019-10-22 DIAGNOSIS — R252 Cramp and spasm: Secondary | ICD-10-CM | POA: Diagnosis not present

## 2019-10-22 DIAGNOSIS — M19072 Primary osteoarthritis, left ankle and foot: Secondary | ICD-10-CM | POA: Diagnosis not present

## 2019-10-22 NOTE — Progress Notes (Signed)
Virtual Visit Note  I connected with patient on 10/22/19 at 551pm by phone and verified that I am speaking with the correct person using two identifiers. Kinleigh M Castanon is currently located at home and patient is currently with them during visit. The provider, Rutherford Guys, MD is located in their office at time of visit.  I discussed the limitations, risks, security and privacy concerns of performing an evaluation and management service by telephone and the availability of in person appointments. I also discussed with the patient that there may be a patient responsible charge related to this service. The patient expressed understanding and agreed to proceed.   I provided 9 minutes of non-face-to-face time during this encounter.  Chief Complaint  Patient presents with  . Abdominal Cramping    Pt stated that for the past couple of days she has been experiencing bad cramps. Legs and calf are sore fro th cramps and she is also concerned about the cramps and if it could hit her heart and cause more damage.     HPI   Seen aug 6th for muscle cramps - normal CMP, mag, CK, b12 She has worsening leg muscle cramps, not sleeping well  ? Allergies  Allergen Reactions  . Valium [Diazepam] Swelling    face    Prior to Admission medications   Medication Sig Start Date End Date Taking? Authorizing Provider  aspirin EC 81 MG tablet Take 81 mg by mouth at bedtime.   Yes [provider]  blood glucose meter kit and supplies KIT Dispense based on patient and insurance preference. Use three times a day as directed. Dx. E11.65, Z79.4 10/11/19  Yes Rutherford Guys, MD  carvedilol (COREG) 12.5 MG tablet TAKE 1 1/2 TABLET BY MOUTH TWICE A DAY 03/13/19  Yes Deboraha Sprang, MD  clopidogrel (PLAVIX) 75 MG tablet Take 1 tablet by mouth once daily 02/15/19  Yes Lorretta Harp, MD  furosemide (LASIX) 40 MG tablet Take 2 tablets (80 mg total) by mouth every morning AND 1 tablet (40 mg total) every  evening. 80 MG IN THE AM AND 40MG IN THE PM.. 07/22/19  Yes Cleaver, Jossie Ng, NP  insulin aspart (NOVOLOG FLEXPEN) 100 UNIT/ML FlexPen Per insulin sliding scale, max TTD, 20 units. 06/26/17  Yes Rutherford Guys, MD  Insulin Detemir (LEVEMIR) 100 UNIT/ML Pen Inject 16 Units into the skin daily at 10 pm. 09/04/18  Yes Rutherford Guys, MD  Insulin Pen Needle (BD PEN NEEDLE MICRO U/F) 32G X 6 MM MISC USE NEW NEEDLE FOR EACH INJECTION OF INSULIN, FOUR TIMES DAILY 04/30/19  Yes Rutherford Guys, MD  ipratropium (ATROVENT) 0.03 % nasal spray Place 2 sprays into both nostrils 2 (two) times daily. 05/23/19  Yes Rutherford Guys, MD  levothyroxine (SYNTHROID) 88 MCG tablet TAKE 1 TABLET BY MOUTH ONCE DAILY AT BEDTIME 09/16/19  Yes Rutherford Guys, MD  mupirocin ointment (BACTROBAN) 2 % Apply 1 application topically 3 (three) times daily. 07/29/19  Yes Rutherford Guys, MD  nitroGLYCERIN (NITROSTAT) 0.4 MG SL tablet Place 1 tablet (0.4 mg total) under the tongue every 5 (five) minutes as needed for chest pain. 05/06/19  Yes Kilroy, Luke K, PA-C  ONE TOUCH ULTRA TEST test strip  06/07/17  Yes [provider]  Jonetta Speak LANCETS [address] [place]  06/07/17  Yes [provider]  oxyCODONE-acetaminophen (PERCOCET/ROXICET) 5-325 MG tablet Take by mouth every 4 (four) hours as needed for severe pain.  Yes [provider]  potassium chloride SA (KLOR-CON) 20 MEQ tablet TAKE 1 TAB BY MOUTH 2 TIMES A DAY ON Monday, Wednesday AND Friday  (days you take Lasix) 05/21/19  Yes Kilroy, Doreene Burke, PA-C  rOPINIRole (REQUIP) 0.5 MG tablet Take 1 tablet (0.5 mg total) by mouth at bedtime. 10/11/19  Yes Rutherford Guys, MD  simvastatin (ZOCOR) 20 MG tablet TAKE 1 TABLET BY MOUTH ONCE DAILY WITH SUPPER 06/19/19  Yes Rutherford Guys, MD  triamcinolone cream (KENALOG) 0.1 % APPLY ONE APPLICATION TOPICALLY TWO TIMES DAILY 11/28/18  Yes Rutherford Guys, MD    Past Medical History:  Diagnosis Date  . Anxiety   . Arthritis     . Chronic pain    a. Prior h/o chronic pain on methadone.  . Chronic systolic CHF (congestive heart failure) (HCC)    a. mixed ischemic/non-ischemic cardiomyopathy. b. s/p CRT-D in 06/2014.  Marland Kitchen CKD (chronic kidney disease), stage III   . Complication of anesthesia    hard time waking her up from general surgery  . Coronary artery disease    a. remote RCA stenting in 2008 with non-DES. b. Cath 06/2014 following abnormal nuc: Stable, unchanged from prior cath, patent stent and 40% LM.  . Diabetes mellitus ([place])   . GERD (gastroesophageal reflux disease)   . Headache   . Hyperlipidemia   . Hypertension   . LBBB (left bundle branch block)   . Nonischemic cardiomyopathy ([place])   . OSA (obstructive sleep apnea)    AHI-9.77/hr, during REM-50.32/hr  . Tobacco abuse     Past Surgical History:  Procedure Laterality Date  . BACK SURGERY  2013  . BI-VENTRICULAR IMPLANTABLE CARDIOVERTER DEFIBRILLATOR N/A 06/11/2014   STJ CRTD implanted by Dr Caryl Comes  . CARDIAC CATHETERIZATION  12/14/2006   RCA stented with a 3.0 [employer] Liberte stent resulting in a reduction of 75% to 0% residual  . CHOLECYSTECTOMY     20 years ago  . COLONOSCOPY WITH PROPOFOL N/A 12/15/2014   Procedure: COLONOSCOPY WITH PROPOFOL;  Surgeon: Clarene Essex, MD;  Location: [facility];  Service: Endoscopy;  Laterality: N/A;  . EYE SURGERY     bilateral cataract surgery   . LEFT AND RIGHT HEART CATHETERIZATION WITH CORONARY ANGIOGRAM N/A 05/29/2014   Procedure: LEFT AND RIGHT HEART CATHETERIZATION WITH CORONARY ANGIOGRAM;  Surgeon: Lorretta Harp, MD;  Location: [facility] CATH LAB;  Service: Cardiovascular;  Laterality: N/A;  . LEFT HEART CATHETERIZATION WITH CORONARY ANGIOGRAM N/A 12/27/2012   Procedure: LEFT HEART CATHETERIZATION WITH CORONARY ANGIOGRAM;  Surgeon: Lorretta Harp, MD;  Location: [facility] C[facility] LAB;  Service: Cardiovascular;  Laterality: N/A;    Social History   Tobacco Use  . Smoking status: Current Every Day Smoker     Packs/day: 2.00    Years: 58.00    Pack years: 116.00    Types: Cigarettes    Start date: 12/26/1951    Last attempt to quit: 12/05/2013    Years since quitting: 5.8  . Smokeless tobacco: Never Used  . Tobacco comment: uses Vape cigarettes  Substance Use Topics  . Alcohol use: No    Alcohol/week: 0.0 standard drinks    Family History  Problem Relation Age of Onset  . Stroke Mother   . Hypertension Mother   . Coronary artery disease Father   . Stroke Brother   . Heart disease Brother   . Other Brother        H1N1 VIRUS  . Healthy Sister   .  Healthy Sister     ROS Per hpi  Objective  Vitals as reported by the patient: none   ASSESSMENT and PLAN  1. Nocturnal muscle cramps Discussed supportive measures, OTC supplements. Patient educational handout given with stretches. Consider gabapentin at bedtime if needed  FOLLOW-UP: prn   The above assessment and management plan was discussed with the patient. The patient verbalized understanding of and has agreed to the management plan. Patient is aware to call the clinic if symptoms persist or worsen. Patient is aware when to return to the clinic for a follow-up visit. Patient educated on when it is appropriate to go to the emergency department.     Rutherford Guys, MD Primary Care at [address] [place], [place] [address] Ph.  [phone] Fax [phone]

## 2019-10-22 NOTE — Patient Instructions (Addendum)
Vitamin B6 100mg  a day Tonic water 1-2 ounce 3 x day   Standing Calf Stretch Stand facing a wall with your elbows straight and both hands on the wall at chest level.  One leg is forward with your knee bent, and the other leg is back with your knee straight. Both feet are in full contact with the floor. Your back heel remains in contact with the floor as you lean towards the wall.  Hold the stretch for a minimum of 10 seconds and as long as 30 seconds. Perform 2 to 3 stretches on each leg immediately prior to bed each night.   Standing Hamstring Stretch Face a chair that is placed against a wall.  Place one heel on the chair with your knee fully straight.  Hold on to something for balance.  Bend at the waist so that your trunk tilts forward, keeping your back straight. The foot on the floor should maintain full contact and the other heel remains in contact with the chair.  Hold the stretch for a minimum of 10 seconds and as long as 30 seconds. Perform 2 to 3 stretches on each leg immediately prior to bed each night.  Seated Hamstring Stretch This stretch is similar to the standing version.  Not everyone will need to perform both.  To perform the sitting version sit on the side of your bed with one leg extended in front. The other foot maintains on the floor.  Bend at the waist so that your trunk tilts forward, keeping your back straight.  Hold the stretch for a minimum of 10 seconds and as long as 30 seconds. Perform 2 to 3 stretches on each leg immediately prior to bed each night.  If you have lab work done today you will be contacted with your lab results within the next 2 weeks.  If you have not heard from [place] then please contact us. The fastest way to get your results is to register for My Chart.   IF you received an x-ray today, you will receive an invoice from [facility]. Please contact [facility] at [phone] with questions or concerns regarding your invoice.   IF  you received labwork today, you will receive an invoice from [facility]. Please contact [facility] at [phone] with questions or concerns regarding your invoice.   Our billing staff will not be able to assist you with questions regarding bills from these companies.  You will be contacted with the lab results as soon as they are available. The fastest way to get your results is to activate your My Chart account. Instructions are located on the last page of this paperwork. If you have not heard from [place] regarding the results in 2 weeks, please contact this office.

## 2019-10-28 ENCOUNTER — Telehealth: Payer: Self-pay | Admitting: Family Medicine

## 2019-10-28 MED ORDER — GABAPENTIN 300 MG PO CAPS: 300.0000 mg | ORAL_CAPSULE | Freq: Every day | ORAL | 3 refills | Status: DC

## 2019-10-28 NOTE — Telephone Encounter (Signed)
I sent in a rx for gabapentin to [facility]. I want her to cont with vitamin B and stretches. thanks

## 2019-10-28 NOTE — Telephone Encounter (Signed)
Pt is calling and is wanting the muscle relaxer now pt has tried the alternative that her and provider discussed and pt stated the pain is just unbearable. Please advise.

## 2019-10-28 NOTE — Telephone Encounter (Signed)
Pt saw you 10/11/2019 you discussed alternatives but pt states pain is unbearable and is requesting muscle relaxer she discussed. Please advise.

## 2019-10-28 NOTE — Telephone Encounter (Signed)
I have relayed the message to the pt and also resent her the stretches via my chart.   Pt stated that she wanted something else because the Gabapentin is not helpful and doesn't work. Pt stated that she is okay with anything else besides gabapentin.

## 2019-10-29 NOTE — Telephone Encounter (Signed)
She has never tried gabapentin for nocturnal muscle cramps. Lets try this first.

## 2019-10-31 ENCOUNTER — Other Ambulatory Visit: Payer: Self-pay

## 2019-10-31 ENCOUNTER — Telehealth (INDEPENDENT_AMBULATORY_CARE_PROVIDER_SITE_OTHER): Payer: Medicare HMO | Admitting: Internal Medicine

## 2019-10-31 ENCOUNTER — Telehealth: Payer: Self-pay

## 2019-10-31 VITALS — Ht 65.0 in | Wt 143.0 lb

## 2019-10-31 DIAGNOSIS — R55 Syncope and collapse: Secondary | ICD-10-CM

## 2019-10-31 DIAGNOSIS — I5022 Chronic systolic (congestive) heart failure: Secondary | ICD-10-CM

## 2019-10-31 DIAGNOSIS — I428 Other cardiomyopathies: Secondary | ICD-10-CM | POA: Diagnosis not present

## 2019-10-31 DIAGNOSIS — Z95810 Presence of automatic (implantable) cardiac defibrillator: Secondary | ICD-10-CM

## 2019-10-31 NOTE — Patient Instructions (Signed)

## 2019-10-31 NOTE — Telephone Encounter (Signed)
  Patient Consent for Virtual Visit         [name] has provided verbal consent on 10/31/2019 for a virtual visit (video or telephone).   CONSENT FOR VIRTUAL VISIT FOR:  [name]  By participating in this virtual visit I agree to the following:  I hereby voluntarily request, consent and authorize [facility] and its employed or contracted physicians, physician assistants, nurse practitioners or other licensed health care professionals (the Practitioner), to provide me with telemedicine health care services (the "Services") as deemed necessary by the treating Practitioner. I acknowledge and consent to receive the Services by the Practitioner via telemedicine. I understand that the telemedicine visit will involve communicating with the Practitioner through live audiovisual communication technology and the disclosure of certain medical information by electronic transmission. I acknowledge that I have been given the opportunity to request an in-person assessment or other available alternative prior to the telemedicine visit and am voluntarily participating in the telemedicine visit.  I understand that I have the right to withhold or withdraw my consent to the use of telemedicine in the course of my care at any time, without affecting my right to future care or treatment, and that the Practitioner or I may terminate the telemedicine visit at any time. I understand that I have the right to inspect all information obtained and/or recorded in the course of the telemedicine visit and may receive copies of available information for a reasonable fee.  I understand that some of the potential risks of receiving the Services via telemedicine include:  Marland Kitchen Delay or interruption in medical evaluation due to technological equipment failure or disruption; . Information transmitted may not be sufficient (e.g. poor resolution of images) to allow for appropriate medical decision making by the Practitioner;  and/or  . In rare instances, security protocols could fail, causing a breach of personal health information.  Furthermore, I acknowledge that it is my responsibility to provide information about my medical history, conditions and care that is complete and accurate to the best of my ability. I acknowledge that Practitioner's advice, recommendations, and/or decision may be based on factors not within their control, such as incomplete or inaccurate data provided by me or distortions of diagnostic images or specimens that may result from electronic transmissions. I understand that the practice of medicine is not an exact science and that Practitioner makes no warranties or guarantees regarding treatment outcomes. I acknowledge that a copy of this consent can be made available to me via my patient portal ([facility]), or I can request a printed copy by calling the office of [facility].    I understand that my insurance will be billed for this visit.   I have read or had this consent read to me. . I understand the contents of this consent, which adequately explains the benefits and risks of the Services being provided via telemedicine.  . I have been provided ample opportunity to ask questions regarding this consent and the Services and have had my questions answered to my satisfaction. . I give my informed consent for the services to be provided through the use of telemedicine in my medical care

## 2019-10-31 NOTE — Progress Notes (Signed)
Patient Care Team: Rutherford Guys, MD as PCP - General (Family Medicine) Lorretta Harp, MD as PCP - Cardiology (Cardiology) Lorretta Harp, MD as Consulting Physician (Cardiology) Chucky May, MD as Consulting Physician (Psychiatry) Kristeen Miss, MD as Consulting Physician (Neurosurgery)   HPI  [name] is a 79 y.o. female      Past Medical History:  Diagnosis Date  . Anxiety   . Arthritis   . Chronic pain    a. Prior h/o chronic pain on methadone.  . Chronic systolic CHF (congestive heart failure) (HCC)    a. mixed ischemic/non-ischemic cardiomyopathy. b. s/p CRT-D in 06/2014.  Marland Kitchen CKD (chronic kidney disease), stage III   . Complication of anesthesia    hard time waking her up from general surgery  . Coronary artery disease    a. remote RCA stenting in 2008 with non-DES. b. Cath 06/2014 following abnormal nuc: Stable, unchanged from prior cath, patent stent and 40% LM.  . Diabetes mellitus ([place])   . GERD (gastroesophageal reflux disease)   . Headache   . Hyperlipidemia   . Hypertension   . LBBB (left bundle branch block)   . Nonischemic cardiomyopathy ([place])   . OSA (obstructive sleep apnea)    AHI-9.77/hr, during REM-50.32/hr  . Tobacco abuse     Past Surgical History:  Procedure Laterality Date  . BACK SURGERY  2013  . BI-VENTRICULAR IMPLANTABLE CARDIOVERTER DEFIBRILLATOR N/A 06/11/2014   STJ CRTD implanted by Dr Caryl Comes  . CARDIAC CATHETERIZATION  12/14/2006   RCA stented with a 3.0 [employer] Liberte stent resulting in a reduction of 75% to 0% residual  . CHOLECYSTECTOMY     20 years ago  . COLONOSCOPY WITH PROPOFOL N/A 12/15/2014   Procedure: COLONOSCOPY WITH PROPOFOL;  Surgeon: Clarene Essex, MD;  Location: [facility];  Service: Endoscopy;  Laterality: N/A;  . EYE SURGERY     bilateral cataract surgery   . LEFT AND RIGHT HEART CATHETERIZATION WITH CORONARY ANGIOGRAM N/A 05/29/2014   Procedure: LEFT AND RIGHT HEART CATHETERIZATION WITH  CORONARY ANGIOGRAM;  Surgeon: Lorretta Harp, MD;  Location: [facility] CATH LAB;  Service: Cardiovascular;  Laterality: N/A;  . LEFT HEART CATHETERIZATION WITH CORONARY ANGIOGRAM N/A 12/27/2012   Procedure: LEFT HEART CATHETERIZATION WITH CORONARY ANGIOGRAM;  Surgeon: Lorretta Harp, MD;  Location: [facility] CATH LAB;  Service: Cardiovascular;  Laterality: N/A;    Current Outpatient Medications  Medication Sig Dispense Refill  . aspirin EC 81 MG tablet Take 81 mg by mouth at bedtime.    . blood glucose meter kit and supplies KIT Dispense based on patient and insurance preference. Use three times a day as directed. Dx. E11.65, Z79.4 1 each 11  . carvedilol (COREG) 12.5 MG tablet TAKE 1 1/2 TABLET BY MOUTH TWICE A DAY 270 tablet 2  . Cholecalciferol (VITAMIN D3) 1.25 MG (50000 UT) TABS Take by mouth.    . clopidogrel (PLAVIX) 75 MG tablet Take 1 tablet by mouth once daily 90 tablet 2  . furosemide (LASIX) 40 MG tablet Take 2 tablets (80 mg total) by mouth every morning AND 1 tablet (40 mg total) every evening. 80 MG IN THE AM AND 40MG IN THE PM.. 180 tablet 1  . Garlic 924 MG CAPS Take by mouth.    . insulin aspart (NOVOLOG FLEXPEN) 100 UNIT/ML FlexPen Per insulin sliding scale, max TTD, 20 units. 15 mL 11  . Insulin Detemir (LEVEMIR) 100 UNIT/ML Pen Inject 16 Units into  the skin daily at 10 pm. 15 mL 3  . Insulin Pen Needle (BD PEN NEEDLE MICRO U/F) 32G X 6 MM MISC USE NEW NEEDLE FOR EACH INJECTION OF INSULIN, FOUR TIMES DAILY 100 each 5  . ipratropium (ATROVENT) 0.03 % nasal spray Place 2 sprays into both nostrils 2 (two) times daily. 30 mL 3  . levothyroxine (SYNTHROID) 88 MCG tablet TAKE 1 TABLET BY MOUTH ONCE DAILY AT BEDTIME 90 tablet 0  . nitroGLYCERIN (NITROSTAT) 0.4 MG SL tablet Place 1 tablet (0.4 mg total) under the tongue every 5 (five) minutes as needed for chest pain. 25 tablet 3  . ONE TOUCH ULTRA TEST test strip   11  . ONETOUCH DELICA LANCETS 75O MISC   11  . oxyCODONE-acetaminophen  (PERCOCET/ROXICET) 5-325 MG tablet Take by mouth every 4 (four) hours as needed for severe pain.    . potassium chloride SA (KLOR-CON) 20 MEQ tablet TAKE 1 TAB BY MOUTH 2 TIMES A DAY ON Monday, Wednesday AND Friday  (days you take Lasix) 180 tablet 0  . pyridOXINE (VITAMIN B-6) 100 MG tablet Take 100 mg by mouth daily.    Marland Kitchen rOPINIRole (REQUIP) 0.5 MG tablet Take 1 tablet (0.5 mg total) by mouth at bedtime. 30 tablet 3  . simvastatin (ZOCOR) 20 MG tablet TAKE 1 TABLET BY MOUTH ONCE DAILY WITH SUPPER 90 tablet 3  . traZODone (DESYREL) 50 MG tablet Take 50 mg by mouth at bedtime. Pt reports she takes 0.5 tablet at bedtime    . gabapentin (NEURONTIN) 300 MG capsule Take 1 capsule (300 mg total) by mouth at bedtime. 30 capsule 3  . mupirocin ointment (BACTROBAN) 2 % Apply 1 application topically 3 (three) times daily. (Patient not taking: Reported on 10/31/2019) 22 g 0  . triamcinolone cream (KENALOG) 0.1 % APPLY ONE APPLICATION TOPICALLY TWO TIMES DAILY (Patient not taking: Reported on 10/31/2019) 30 g 0   No current facility-administered medications for this visit.    Allergies  Allergen Reactions  . Valium [Diazepam] Swelling    face      Review of Systems negative except from HPI and PMH  Physical Exam Ht _0  (1.651 m)   Wt 143 lb (64.9 kg)   BMI 23.80 kg/m  Well developed and well nourished in no acute distress HENT normal Neck supple with JVP-flat Clear Device pocket well healed; without hematoma or erythema.  There is no tethering  Regular rate and rhythm, no  gallop No murmur Abd-soft with active BS No Clubbing cyanosis  edema Skin-warm and dry A & Oriented  Grossly normal sensory and motor function  ECG       Device function normal   Euvolemic continue current meds  BP well controlled  Without symptoms of ischemia  Grief counseling regarding the death of her husband   More than 50% of 40 min was spent in counseling related to the above        Electrophysiology TeleHealth Note   Due to national recommendations of social distancing due to [place] 19, an audio/video telehealth visit is felt to be most appropriate for this patient at this time.  See MyChart message from today for the patient's consent to telehealth for [facility].   Date:  10/31/2019   ID:  [name], DOB 09/12/1940, MRN [id number]  Location: patient's home  Provider location: [address], [place]  Evaluation Performed: Follow-up visit  PCP:  Rutherford Guys, MD  Cardiologist:   Logan Bores Electrophysiologist:  SK   Chief Complaint:  Dizziness   History of Present Illness:    [name] is a 79 y.o. female who presents via Engineer, civil (consulting) for a telehealth visit today.  Since last being seen in our clinic for follow-up for CRT- ICD implanted 4/16 for ischemic cardiomyopathy with prior stenting,  history of congestive heart failure and left bundle branch block. Now with near normalization of LV function Prior syncope. the patient reports sleeping better on trazadone almost 6 hrs a night--hence feels much better   No dyspnea, occ edema, no chest tightness  Still smoking--fussed     DATE TEST EF   4/16 Echo   20-25 %   8/17 Echo   50-55 %         Date Cr K TSH  2/19 1.28 4.6  8.440   6/20 1.19 3.8 1.64  8/21 1.16 4.6 2.53        Her son died a year or so ago and her husband died in 07/13/22.  She is extremely remorseful about the circumstances of his dying, having been convinced to send him to a nursing home for some days.  Thankfully she was able to get him home for the last 2 days before he died.  She was married at the age of 89.,    The patient denies symptoms of fevers, chills, cough, or new SOB worrisome for COVID 19.    Past Medical History:  Diagnosis Date  . Anxiety   . Arthritis   . Chronic pain    a. Prior h/o chronic pain on methadone.  . Chronic systolic CHF (congestive heart failure) (HCC)    a. mixed  ischemic/non-ischemic cardiomyopathy. b. s/p CRT-D in 07-13-14.  Marland Kitchen CKD (chronic kidney disease), stage III   . Complication of anesthesia    hard time waking her up from general surgery  . Coronary artery disease    a. remote RCA stenting in 2008 with non-DES. b. Cath 13-Jul-2014 following abnormal nuc: Stable, unchanged from prior cath, patent stent and 40% LM.  . Diabetes mellitus ([place])   . GERD (gastroesophageal reflux disease)   . Headache   . Hyperlipidemia   . Hypertension   . LBBB (left bundle branch block)   . Nonischemic cardiomyopathy ([place])   . OSA (obstructive sleep apnea)    AHI-9.77/hr, during REM-50.32/hr  . Tobacco abuse     Past Surgical History:  Procedure Laterality Date  . BACK SURGERY  2013  . BI-VENTRICULAR IMPLANTABLE CARDIOVERTER DEFIBRILLATOR N/A 06/11/2014   STJ CRTD implanted by Dr Caryl Comes  . CARDIAC CATHETERIZATION  12/14/2006   RCA stented with a 3.0 [employer] Liberte stent resulting in a reduction of 75% to 0% residual  . CHOLECYSTECTOMY     20 years ago  . COLONOSCOPY WITH PROPOFOL N/A 12/15/2014   Procedure: COLONOSCOPY WITH PROPOFOL;  Surgeon: Clarene Essex, MD;  Location: [facility];  Service: Endoscopy;  Laterality: N/A;  . EYE SURGERY     bilateral cataract surgery   . LEFT AND RIGHT HEART CATHETERIZATION WITH CORONARY ANGIOGRAM N/A 05/29/2014   Procedure: LEFT AND RIGHT HEART CATHETERIZATION WITH CORONARY ANGIOGRAM;  Surgeon: Lorretta Harp, MD;  [facility] CATH LAB;  Service: Cardiovascular;  Laterality: N/A;  . LEFT HEART CATHETERIZATION WITH CORONARY ANGIOGRAM N/A 12/27/2012   Procedure: LEFT HEART CATHETERIZATION WITH CORONARY ANGIOGRAM;  Surgeon: Lorretta Harp, MD;  Location: [facility] CATH LAB;  Service: Cardiovascular;  Laterality: N/A;    Current Outpatient Medications  Medication Sig Dispense Refill  . aspirin EC 81 MG tablet Take 81 mg by mouth at bedtime.    . blood glucose meter kit and supplies KIT Dispense based on patient and  insurance preference. Use three times a day as directed. Dx. E11.65, Z79.4 1 each 11  . carvedilol (COREG) 12.5 MG tablet TAKE 1 1/2 TABLET BY MOUTH TWICE A DAY 270 tablet 2  . Cholecalciferol (VITAMIN D3) 1.25 MG (50000 UT) TABS Take by mouth.    . clopidogrel (PLAVIX) 75 MG tablet Take 1 tablet by mouth once daily 90 tablet 2  . furosemide (LASIX) 40 MG tablet Take 2 tablets (80 mg total) by mouth every morning AND 1 tablet (40 mg total) every evening. 80 MG IN THE AM AND 40MG IN THE PM.. 180 tablet 1  . Garlic 549 MG CAPS Take by mouth.    . insulin aspart (NOVOLOG FLEXPEN) 100 UNIT/ML FlexPen Per insulin sliding scale, max TTD, 20 units. 15 mL 11  . Insulin Detemir (LEVEMIR) 100 UNIT/ML Pen Inject 16 Units into the skin daily at 10 pm. 15 mL 3  . Insulin Pen Needle (BD PEN NEEDLE MICRO U/F) 32G X 6 MM MISC USE NEW NEEDLE FOR EACH INJECTION OF INSULIN, FOUR TIMES DAILY 100 each 5  . ipratropium (ATROVENT) 0.03 % nasal spray Place 2 sprays into both nostrils 2 (two) times daily. 30 mL 3  . levothyroxine (SYNTHROID) 88 MCG tablet TAKE 1 TABLET BY MOUTH ONCE DAILY AT BEDTIME 90 tablet 0  . nitroGLYCERIN (NITROSTAT) 0.4 MG SL tablet Place 1 tablet (0.4 mg total) under the tongue every 5 (five) minutes as needed for chest pain. 25 tablet 3  . ONE TOUCH ULTRA TEST test strip   11  . ONETOUCH DELICA LANCETS 82M MISC   11  . oxyCODONE-acetaminophen (PERCOCET/ROXICET) 5-325 MG tablet Take by mouth every 4 (four) hours as needed for severe pain.    . potassium chloride SA (KLOR-CON) 20 MEQ tablet TAKE 1 TAB BY MOUTH 2 TIMES A DAY ON Monday, Wednesday AND Friday  (days you take Lasix) 180 tablet 0  . pyridOXINE (VITAMIN B-6) 100 MG tablet Take 100 mg by mouth daily.    Marland Kitchen rOPINIRole (REQUIP) 0.5 MG tablet Take 1 tablet (0.5 mg total) by mouth at bedtime. 30 tablet 3  . simvastatin (ZOCOR) 20 MG tablet TAKE 1 TABLET BY MOUTH ONCE DAILY WITH SUPPER 90 tablet 3  . traZODone (DESYREL) 50 MG tablet Take 50 mg  by mouth at bedtime. Pt reports she takes 0.5 tablet at bedtime    . gabapentin (NEURONTIN) 300 MG capsule Take 1 capsule (300 mg total) by mouth at bedtime. 30 capsule 3  . mupirocin ointment (BACTROBAN) 2 % Apply 1 application topically 3 (three) times daily. (Patient not taking: Reported on 10/31/2019) 22 g 0  . triamcinolone cream (KENALOG) 0.1 % APPLY ONE APPLICATION TOPICALLY TWO TIMES DAILY (Patient not taking: Reported on 10/31/2019) 30 g 0   No current facility-administered medications for this visit.    Allergies:   Valium [diazepam]   Social History:  The patient  reports that she has been smoking cigarettes. She started smoking about 66 years ago. She has a 116.00 pack-year smoking history. She has never used smokeless tobacco. She reports that she does not drink alcohol and does not use drugs.   Family History:  The patient's   family history includes Coronary artery disease in her father; Healthy in her sister and sister; Heart disease  in her brother; Hypertension in her mother; Other in her brother; Stroke in her brother and mother.   ROS:  Please see the history of present illness.   All other systems are personally reviewed and negative.    Exam:    Vital Signs:  Ht _0  (1.651 m)   Wt 143 lb (64.9 kg)   BMI 23.80 kg/m     Labs/Other Tests and Data Reviewed:    Recent Labs: 05/14/2019: Hemoglobin 15.5; Platelets 235 10/11/2019: ALT 11; BUN 19; Creatinine, Ser 1.16; Magnesium 2.3; Potassium 4.6; Sodium 141; TSH 2.530   Wt Readings from Last 3 Encounters:  10/31/19 143 lb (64.9 kg)  10/22/19 145 lb (65.8 kg)  10/11/19 148 lb (67.1 kg)     Other studies personally reviewed:   Last device remote is reviewed from [employer] PDF dated 7/21 which reveals normal device function,   arrhythmias - none     ASSESSMENT & PLAN:     Ischemic cardiomyopathy with interval normalization of LV function   Congestive heart failure-chronic-diastolic  Hypertension  Orthostatic  hypotension  Diabetes  CRT-D-[employer]   Grief  Sleep disturbance  Euvolemic continue current meds  Mild OI; no interval syncope  Without symptoms of ischemia  Life a little better    Recommended she followup with her PCP re sleep meds rather than taking the years old trazodone    COVID 19 screen The patient denies symptoms of COVID 19 at this time.  The importance of social distancing was discussed today.  Follow-up:  67mNext remote: As Scheduled   Current medicines are reviewed at length with the patient today.   The patient does not have concerns regarding her medicines.  The following changes were made today:  none  Labs/ tests ordered today include:  No orders of the defined types were placed in this encounter.       Patient Risk:  after full review of this patients clinical status, I feel that they are at moderate risk at this time.  Today, I have spent 8 minutes with the patient with telehealth technology discussing the above.  Signed, SVirl Axe MD  10/31/2019 2:09 PM     [place] [phone](office) ([phone](fax)

## 2019-11-05 ENCOUNTER — Ambulatory Visit (INDEPENDENT_AMBULATORY_CARE_PROVIDER_SITE_OTHER): Payer: Medicare HMO | Admitting: Family Medicine

## 2019-11-05 ENCOUNTER — Other Ambulatory Visit: Payer: Self-pay

## 2019-11-05 ENCOUNTER — Encounter: Payer: Self-pay | Admitting: Family Medicine

## 2019-11-05 VITALS — BP 102/62 | HR 79 | Temp 97.9°F | Ht 65.0 in | Wt 146.8 lb

## 2019-11-05 DIAGNOSIS — R252 Cramp and spasm: Secondary | ICD-10-CM | POA: Diagnosis not present

## 2019-11-05 DIAGNOSIS — R69 Illness, unspecified: Secondary | ICD-10-CM | POA: Diagnosis not present

## 2019-11-05 DIAGNOSIS — F5104 Psychophysiologic insomnia: Secondary | ICD-10-CM | POA: Diagnosis not present

## 2019-11-05 DIAGNOSIS — G2581 Restless legs syndrome: Secondary | ICD-10-CM

## 2019-11-05 MED ORDER — TRAZODONE HCL 50 MG PO TABS: 25.0000 mg | ORAL_TABLET | Freq: Every day | ORAL | 1 refills | Status: DC

## 2019-11-05 NOTE — Patient Instructions (Signed)
° ° ° °  If you have lab work done today you will be contacted with your lab results within the next 2 weeks.  If you have not heard from us then please contact us. The fastest way to get your results is to register for My Chart. ° ° °IF you received an x-ray today, you will receive an invoice from [facility]. Please contact [facility] at [phone] with questions or concerns regarding your invoice.  ° °IF you received labwork today, you will receive an invoice from [facility]. Please contact [facility] at [phone] with questions or concerns regarding your invoice.  ° °Our billing staff will not be able to assist you with questions regarding bills from these companies. ° °You will be contacted with the lab results as soon as they are available. The fastest way to get your results is to activate your My Chart account. Instructions are located on the last page of this paperwork. If you have not heard from us regarding the results in 2 weeks, please contact this office. °  ° ° ° °

## 2019-11-05 NOTE — Progress Notes (Signed)
8/31/20218:56 AM  [name] September 25, 1940, 79 y.o., female [id number]  Chief Complaint  Patient presents with  . Pain    says due to restless leg, needs refill on trazodone. Currently taking an expired medication.     HPI:   Patient is a 79 y.o. female with past medical history significant for DM2 on insulin, osteomyelitis, CKD 3, CAD, CHF, HTN, HLP, OSA not on cpap, depression and anxiety who presents today for cramps in her feet/legs  Has been seen for same issue several times - being treated conservatively for nocturnal muscle cramps, last OV trial of gabapentin  She never filled gabapentin  She has been doing mustard, tonic water and stretches She also added trazodone She is doing much better She is also sleeping much better  She reports requip working well for her RLS   Depression screen [facility] 2/9 10/22/2019 06/04/2019 04/15/2019  Decreased Interest 0 0 3  Down, Depressed, Hopeless 0 0 3  PHQ - 2 Score 0 0 6  Altered sleeping - 0 3  Tired, decreased energy - 0 3  Change in appetite - 0 3  Feeling bad or failure about yourself  - - -  Trouble concentrating - - -  Moving slowly or fidgety/restless - - -  Suicidal thoughts - - -  PHQ-9 Score - 0 15  Difficult doing work/chores - - -  Some recent data might be hidden    Fall Risk  10/22/2019 07/29/2019 06/04/2019 04/11/2019 03/04/2019  Falls in the past year? 0 0 0 0 0  Number falls in past yr: 0 0 0 0 0  Injury with Fall? 0 0 0 0 0  Risk for fall due to : - - - - Impaired mobility  Follow up Falls evaluation completed - Falls evaluation completed Falls evaluation completed;Education provided -     Allergies  Allergen Reactions  . Valium [Diazepam] Swelling    face    Prior to Admission medications   Medication Sig Start Date End Date Taking? Authorizing Provider  aspirin EC 81 MG tablet Take 81 mg by mouth at bedtime.   Yes [provider]  blood glucose meter kit and supplies KIT Dispense based on  patient and insurance preference. Use three times a day as directed. Dx. E11.65, Z79.4 10/11/19  Yes Rutherford Guys, MD  carvedilol (COREG) 12.5 MG tablet TAKE 1 1/2 TABLET BY MOUTH TWICE A DAY 03/13/19  Yes Deboraha Sprang, MD  Cholecalciferol (VITAMIN D3) 1.25 MG (50000 UT) TABS Take by mouth.   Yes [provider]  clopidogrel (PLAVIX) 75 MG tablet Take 1 tablet by mouth once daily 02/15/19  Yes Lorretta Harp, MD  furosemide (LASIX) 40 MG tablet Take 2 tablets (80 mg total) by mouth every morning AND 1 tablet (40 mg total) every evening. 80 MG IN THE AM AND 40MG IN THE PM.. 07/22/19  Yes Cleaver, Jossie Ng, NP  Garlic 818 MG CAPS Take by mouth.   Yes [provider]  insulin aspart (NOVOLOG FLEXPEN) 100 UNIT/ML FlexPen Per insulin sliding scale, max TTD, 20 units. 06/26/17  Yes Rutherford Guys, MD  Insulin Detemir (LEVEMIR) 100 UNIT/ML Pen Inject 16 Units into the skin daily at 10 pm. 09/04/18  Yes Rutherford Guys, MD  Insulin Pen Needle (BD PEN NEEDLE MICRO U/F) 32G X 6 MM MISC USE NEW NEEDLE FOR EACH INJECTION OF INSULIN, FOUR TIMES DAILY 04/30/19  Yes Rutherford Guys, MD  ipratropium (ATROVENT) 0.03 %  nasal spray Place 2 sprays into both nostrils 2 (two) times daily. 05/23/19  Yes Rutherford Guys, MD  levothyroxine (SYNTHROID) 88 MCG tablet TAKE 1 TABLET BY MOUTH ONCE DAILY AT BEDTIME 09/16/19  Yes Rutherford Guys, MD  mupirocin ointment (BACTROBAN) 2 % Apply 1 application topically 3 (three) times daily. 07/29/19  Yes Rutherford Guys, MD  nitroGLYCERIN (NITROSTAT) 0.4 MG SL tablet Place 1 tablet (0.4 mg total) under the tongue every 5 (five) minutes as needed for chest pain. 05/06/19  Yes Kilroy, Luke K, PA-C  ONE TOUCH ULTRA TEST test strip  06/07/17  Yes [provider]  Jonetta Speak LANCETS [address] [place]  06/07/17  Yes [provider]  oxyCODONE-acetaminophen (PERCOCET/ROXICET) 5-325 MG tablet Take by mouth every 4 (four) hours as needed for severe pain.   Yes  [provider]  potassium chloride SA (KLOR-CON) 20 MEQ tablet TAKE 1 TAB BY MOUTH 2 TIMES A DAY ON Monday, Wednesday AND Friday  (days you take Lasix) 05/21/19  Yes Kilroy, Luke K, PA-C  pyridOXINE (VITAMIN B-6) 100 MG tablet Take 100 mg by mouth daily.   Yes [provider]  rOPINIRole (REQUIP) 0.5 MG tablet Take 1 tablet (0.5 mg total) by mouth at bedtime. 10/11/19  Yes Rutherford Guys, MD  simvastatin (ZOCOR) 20 MG tablet TAKE 1 TABLET BY MOUTH ONCE DAILY WITH SUPPER 06/19/19  Yes Rutherford Guys, MD  traZODone (DESYREL) 50 MG tablet Take 50 mg by mouth at bedtime. Pt reports she takes 0.5 tablet at bedtime   Yes [provider]  triamcinolone cream (KENALOG) 0.1 % APPLY ONE APPLICATION TOPICALLY TWO TIMES DAILY 11/28/18  Yes Rutherford Guys, MD    Past Medical History:  Diagnosis Date  . Anxiety   . Arthritis   . Chronic pain    a. Prior h/o chronic pain on methadone.  . Chronic systolic CHF (congestive heart failure) (HCC)    a. mixed ischemic/non-ischemic cardiomyopathy. b. s/p CRT-D in 06/2014.  [place] Kitchen CKD (chronic kidney disease), stage III   . Complication of anesthesia    hard time waking her up from general surgery  . Coronary artery disease    a. remote RCA stenting in 2008 with non-DES. b. Cath 06/2014 following abnormal nuc: Stable, unchanged from prior cath, patent stent and 40% LM.  . Diabetes mellitus ([place])   . GERD (gastroesophageal reflux disease)   . Headache   . Hyperlipidemia   . Hypertension   . LBBB (left bundle branch block)   . Nonischemic cardiomyopathy ([place])   . OSA (obstructive sleep apnea)    AHI-9.77/hr, during REM-50.32/hr  . Tobacco abuse     Past Surgical History:  Procedure Laterality Date  . BACK SURGERY  2013  . BI-VENTRICULAR IMPLANTABLE CARDIOVERTER DEFIBRILLATOR N/A 06/11/2014   STJ CRTD implanted by Dr Caryl Comes  . CARDIAC CATHETERIZATION  12/14/2006   RCA stented with a 3.0 [employer] Liberte stent resulting in a  reduction of 75% to 0% residual  . CHOLECYSTECTOMY     20 years ago  . COLONOSCOPY WITH PROPOFOL N/A 12/15/2014   Procedure: COLONOSCOPY WITH PROPOFOL;  Surgeon: Clarene Essex, MD;  Location: [facility] ENDOSCOPY;  Service: Endoscopy;  Laterality: N/A;  . EYE SURGERY     bilateral cataract surgery   . LEFT AND RIGHT HEART CATHETERIZATION WITH CORONARY ANGIOGRAM N/A 05/29/2014   Procedure: LEFT AND RIGHT HEART CATHETERIZATION WITH CORONARY ANGIOGRAM;  Surgeon: Lorretta Harp, MD;  Location[facility] C[facility] LAB;  Service: Cardiovascular;  Laterality: N/A;  . LEFT HEART CATHETERIZATION WITH CORONARY ANGIOGRAM N/A 12/27/2012   Procedure: LEFT HEART CATHETERIZATION WITH CORONARY ANGIOGRAM;  Surgeon: Lorretta Harp, MD;  Location[facility] C[facility] LAB;  Service: Cardiovascular;  Laterality: N/A;    Social History   Tobacco Use  . Smoking status: Current Every Day Smoker    Packs/day: 2.00    Years: 58.00    Pack years: 116.00    Types: Cigarettes    Start date: 12/26/1952    Last attempt to quit: 12/05/2013    Years since quitting: 5.9  . Smokeless tobacco: Never Used  . Tobacco comment: uses Vape cigarettes  Substance Use Topics  . Alcohol use: No    Alcohol/week: 0.0 standard drinks    Family History  Problem Relation Age of Onset  . Stroke Mother   . Hypertension Mother   . Coronary artery disease Father   . Stroke Brother   . Heart disease Brother   . Other Brother        H1N1 VIRUS  . Healthy Sister   . Healthy Sister     ROS Per hpi  OBJECTIVE:  Today's Vitals   11/05/19 0835  BP: 102/62  Pulse: 79  Temp: 97.9 F (36.6 C)  SpO2: 95%  Weight: 146 lb 12.8 oz (66.6 kg)  Height: _0  (1.651 m)   Body mass index is 24.43 kg/m.   Physical Exam Vitals and nursing note reviewed.  Constitutional:      Appearance: She is well-developed.  HENT:     Head: Normocephalic and atraumatic.  Eyes:     General: No scleral icterus.    Conjunctiva/sclera: Conjunctivae normal.     Pupils:  Pupils are equal, round, and reactive to light.  Pulmonary:     Effort: Pulmonary effort is normal.  Musculoskeletal:     Cervical back: Neck supple.  Skin:    General: Skin is warm and dry.  Neurological:     Mental Status: She is alert and oriented to person, place, and time.     No results found for this or any previous visit (from the past 24 hour(s)).  No results found.   ASSESSMENT and PLAN  1. Nocturnal muscle cramps Much improved. Cont with supportive measures  2. Psychophysiological insomnia Doing well. New rx for rtazadone given. Reviewed r/se/b  3. Restless leg Doing well. Cont requip  Other orders - traZODone (DESYREL) 50 MG tablet; Take 0.5-1 tablets (25-50 mg total) by mouth at bedtime.  Return for as scheduled.    Rutherford Guys, MD [facility] [place], [place] [address] Ph.  [phone] Fax [phone]

## 2019-11-12 ENCOUNTER — Ambulatory Visit (INDEPENDENT_AMBULATORY_CARE_PROVIDER_SITE_OTHER): Payer: Medicare HMO

## 2019-11-12 ENCOUNTER — Telehealth: Payer: Self-pay

## 2019-11-12 DIAGNOSIS — Z95810 Presence of automatic (implantable) cardiac defibrillator: Secondary | ICD-10-CM

## 2019-11-12 DIAGNOSIS — I5022 Chronic systolic (congestive) heart failure: Secondary | ICD-10-CM

## 2019-11-12 NOTE — Telephone Encounter (Signed)
Remote ICM transmission received.  Attempted call to patient regarding ICM remote transmission and left detailed message per DPR.  Advised to return call for any fluid symptoms or questions. Next ICM remote transmission scheduled 11/20/2019.

## 2019-11-12 NOTE — Progress Notes (Signed)
EPIC Encounter for ICM Monitoring  Patient Name: Marguerita Stapp Delany is a 79 y.o. female Date: 11/12/2019 Primary Care Physican: Rutherford Guys, MD Primary Cardiologist:Berry Electrophysiologist: Vergie Living Pacing: >99% 8/31/2021OfficeWeight:146lbs  Attempted call to patient and unable to reach.  Left detailed message per DPR regarding transmission. Transmission reviewed.   Corvue thoracic impedancesuggesting possible fluid accumulation since 11/09/2019.  Prescribed:  Furosemide 40 mgontake 2 tablets (80 mg total) every morning and 1 tablet (40 mg total) every evening.  Potassium 20 mEqOnMonday, Wednesday AND Friday TAKE 1 TAB BY MOUTH 2 TIMES A DAYand on the days you take lasix.  Labs: 10/11/2019 Creatinine 1.16, BUN 19, Potassium 4.6, Sodium 141, GFR 46-52 07/03/2019 Creatinine1.22, BUN16, Potassium4.1, [id number], [id number] 05/14/2019 Creatinine1.31, BUN19, Potassium5.4, [id number], [id number] 04/17/2019 Creatinine 1.22, BUN 16, Potassium 4.3, Sodium 143, GFR 43-49 A complete set of results can be found in Results Review.  Recommendations:Left voice mail with ICM number and encouraged to call if experiencing any fluid symptoms.  Follow-up plan: ICM clinic phone appointment on9/15/2021 to recheck fluid levels. 91 day device clinic remote transmission10/13/2021.   EP/Cardiology Office Visits:Recall for 10/30/2020 with Dr Caryl Comes.Recall 3/1/2022with Dr. Gwenlyn Found.   Copy of ICM check sent to Dr.Klein and Dr Gwenlyn Found.  3 month ICM trend: 11/12/2019    1 Year ICM trend:       [name], RN 11/12/2019 1:54 PM

## 2019-11-20 ENCOUNTER — Other Ambulatory Visit: Payer: Self-pay | Admitting: Cardiovascular Disease

## 2019-11-20 ENCOUNTER — Ambulatory Visit (INDEPENDENT_AMBULATORY_CARE_PROVIDER_SITE_OTHER): Payer: Medicare HMO

## 2019-11-20 DIAGNOSIS — Z95810 Presence of automatic (implantable) cardiac defibrillator: Secondary | ICD-10-CM

## 2019-11-20 DIAGNOSIS — I5022 Chronic systolic (congestive) heart failure: Secondary | ICD-10-CM

## 2019-11-22 NOTE — Progress Notes (Signed)
EPIC Encounter for ICM Monitoring  Patient Name: [name] is a 79 y.o. female Date: 11/22/2019 Primary Care Physican: Rutherford Guys, MD Primary Cardiologist:Berry Electrophysiologist: Vergie Living Pacing: >99% 8/31/2021OfficeWeight:146lbs  Transmission reviewed.   Corvue thoracic impedancesuggesting fluid levels returned to normal.  Prescribed:  Furosemide 40 mgontake 2 tablets (80 mg total) every morning and 1 tablet (40 mg total) every evening.  Potassium 20 mEqOnMonday, Wednesday AND Friday TAKE 1 TAB BY MOUTH 2 TIMES A DAYand on the days you take lasix.  Labs: 10/11/2019 Creatinine 1.16, BUN 19, Potassium 4.6, Sodium 141, GFR 46-52 07/03/2019 Creatinine1.22, BUN16, Potassium4.1, [id number], [id number] 05/14/2019 Creatinine1.31, BUN19, Potassium5.4, [id number], [id number] 04/17/2019 Creatinine 1.22, BUN 16, Potassium 4.3, Sodium 143, GFR 43-49 A complete set of results can be found in Results Review.  Recommendations: No changes  Follow-up plan: ICM clinic phone appointment on10/18/2021. 91 day device clinic remote transmission10/13/2021.   EP/Cardiology Office Visits:Recall for 10/30/2020 with Dr Caryl Comes.Recall 3/1/2022with Dr. Gwenlyn Found.   Copy of ICM check sent to [place]  3 month ICM trend: 11/20/2019    1 Year ICM trend:       [name], RN 11/22/2019 4:41 PM

## 2019-11-25 DIAGNOSIS — R159 Full incontinence of feces: Secondary | ICD-10-CM | POA: Diagnosis not present

## 2019-11-25 DIAGNOSIS — K5909 Other constipation: Secondary | ICD-10-CM | POA: Diagnosis not present

## 2019-11-25 DIAGNOSIS — D126 Benign neoplasm of colon, unspecified: Secondary | ICD-10-CM | POA: Diagnosis not present

## 2019-11-29 ENCOUNTER — Telehealth: Payer: Self-pay | Admitting: *Deleted

## 2019-11-29 NOTE — Telephone Encounter (Signed)
   Primary Cardiologist: Quay Burow, MD  Chart reviewed as part of pre-operative protocol coverage. Given past medical history and time since last visit, based on ACC/AHA guidelines, Judah M Wile would be at acceptable risk for the planned procedure without further cardiovascular testing.   I will route this recommendation to the requesting party via Epic fax function and remove from pre-op pool.  Please call with questions.  Phill Myron. Mory Herrman DNP, ANP, AACC  11/29/2019, 9:18 AM

## 2019-11-29 NOTE — Telephone Encounter (Signed)
   [facility] Pre-operative Risk Assessment    HEARTCARE STAFF: - Please ensure there is not already an duplicate clearance open for this procedure. - Under Visit Info/Reason for Call, type in Other and utilize the format Clearance MM/DD/YY or Clearance TBD. Do not use dashes or single digits. - If request is for dental extraction, please clarify the # of teeth to be extracted.  Request for surgical clearance:  1. What type of surgery is being performed? colonoscopy   2. When is this surgery scheduled? 12/20/19   3. What type of clearance is required (medical clearance vs. Pharmacy clearance to hold med vs. Both)?both  4. Are there any medications that need to be held prior to surgery and how long?plavix-need direction   5. Practice name and name of physician performing surgery? [facility]   6. What is the office phone number? [phone]   7.   What is the office fax number? [phone]  8.   Anesthesia type (None, local, MAC, general) ? propfol   Fredia Beets 11/29/2019, 8:25 AM  _________________________________________________________________   (provider comments below)

## 2019-12-04 ENCOUNTER — Other Ambulatory Visit: Payer: Self-pay | Admitting: Family Medicine

## 2019-12-12 ENCOUNTER — Ambulatory Visit: Payer: Medicare HMO | Admitting: Podiatry

## 2019-12-13 NOTE — Telephone Encounter (Signed)
Okay to hold Plavix for colonoscopy 

## 2019-12-13 NOTE — Telephone Encounter (Signed)
Hx of CAD s/p stenting to RCA remotely. Multiple cath afterwards showed stable anatomy. Last performed cardiac catheterization on her 05/29/2014 revealing unchanged coronary anatomy with severe LV dysfunction.  Patient has remains on DAPT.  CTA of neck 05/2019 showed noncalcified plaque along the mid to distal common carotid causing up to 75% stenosis.  Mild calcified plaque at the ICA without measurable stenosis.  No evidence of flow limitation was noted.  Dr.Berry, can patient hold Plavix for 5 days prior to colonoscopy?

## 2019-12-13 NOTE — Telephone Encounter (Signed)
Barb from [facility] states they need direction on the patient's plavix.

## 2019-12-16 NOTE — Telephone Encounter (Signed)
   Primary Cardiologist: Quay Burow, MD  Chart reviewed as part of pre-operative protocol coverage. Given past medical history and time since last visit, based on ACC/AHA guidelines, [name] would be at acceptable risk for the planned procedure without further cardiovascular testing.   Her Plavix may be held for 5 days prior to her procedure.  Please resume as soon as hemostasis is achieved.  I will route this recommendation to the requesting party via Epic fax function and remove from pre-op pool.  Please call with questions.  Jossie Ng. Torien Ramroop NP-C    12/16/2019, [address] Office [phone] Fax [phone]

## 2019-12-17 DIAGNOSIS — Z1159 Encounter for screening for other viral diseases: Secondary | ICD-10-CM | POA: Diagnosis not present

## 2019-12-18 ENCOUNTER — Ambulatory Visit (INDEPENDENT_AMBULATORY_CARE_PROVIDER_SITE_OTHER): Payer: Medicare HMO

## 2019-12-18 DIAGNOSIS — I428 Other cardiomyopathies: Secondary | ICD-10-CM

## 2019-12-20 ENCOUNTER — Encounter: Payer: Self-pay | Admitting: Family Medicine

## 2019-12-20 DIAGNOSIS — K5730 Diverticulosis of large intestine without perforation or abscess without bleeding: Secondary | ICD-10-CM | POA: Diagnosis not present

## 2019-12-20 DIAGNOSIS — D123 Benign neoplasm of transverse colon: Secondary | ICD-10-CM | POA: Diagnosis not present

## 2019-12-20 DIAGNOSIS — D124 Benign neoplasm of descending colon: Secondary | ICD-10-CM | POA: Diagnosis not present

## 2019-12-20 DIAGNOSIS — Z86010 Personal history of colonic polyps: Secondary | ICD-10-CM | POA: Diagnosis not present

## 2019-12-20 DIAGNOSIS — D120 Benign neoplasm of cecum: Secondary | ICD-10-CM | POA: Diagnosis not present

## 2019-12-20 DIAGNOSIS — T287XXA Corrosion of other parts of alimentary tract, initial encounter: Secondary | ICD-10-CM | POA: Diagnosis not present

## 2019-12-20 DIAGNOSIS — K449 Diaphragmatic hernia without obstruction or gangrene: Secondary | ICD-10-CM | POA: Diagnosis not present

## 2019-12-20 DIAGNOSIS — D125 Benign neoplasm of sigmoid colon: Secondary | ICD-10-CM | POA: Diagnosis not present

## 2019-12-20 DIAGNOSIS — T5494XA Toxic effect of unspecified corrosive substance, undetermined, initial encounter: Secondary | ICD-10-CM | POA: Diagnosis not present

## 2019-12-20 DIAGNOSIS — K219 Gastro-esophageal reflux disease without esophagitis: Secondary | ICD-10-CM | POA: Diagnosis not present

## 2019-12-20 DIAGNOSIS — D122 Benign neoplasm of ascending colon: Secondary | ICD-10-CM | POA: Diagnosis not present

## 2019-12-20 LAB — CUP PACEART REMOTE DEVICE CHECK
Battery Remaining Longevity: 29 mo
Battery Remaining Percentage: 30 %
Battery Voltage: 2.86 V
Brady Statistic AP VP Percent: 13 %
Brady Statistic AP VS Percent: 1 %
Brady Statistic AS VP Percent: 87 %
Brady Statistic AS VS Percent: 1 %
Brady Statistic RA Percent Paced: 13 %
Date Time Interrogation Session: 20211012034620
HighPow Impedance: 74 Ohm
HighPow Impedance: 74 Ohm
Implantable Lead Implant Date: 20160406
Implantable Lead Implant Date: 20160406
Implantable Lead Implant Date: 20160406
Implantable Lead Location: 753858
Implantable Lead Location: 753859
Implantable Lead Location: 753860
Implantable Lead Model: 7122
Implantable Pulse Generator Implant Date: 20160406
Lead Channel Impedance Value: 1200 Ohm
Lead Channel Impedance Value: 360 Ohm
Lead Channel Impedance Value: 630 Ohm
Lead Channel Pacing Threshold Amplitude: 0.625 V
Lead Channel Pacing Threshold Amplitude: 0.875 V
Lead Channel Pacing Threshold Amplitude: 0.875 V
Lead Channel Pacing Threshold Pulse Width: 0.5 ms
Lead Channel Pacing Threshold Pulse Width: 0.5 ms
Lead Channel Pacing Threshold Pulse Width: 0.5 ms
Lead Channel Sensing Intrinsic Amplitude: 12 mV
Lead Channel Sensing Intrinsic Amplitude: 5 mV
Lead Channel Setting Pacing Amplitude: 1.875
Lead Channel Setting Pacing Amplitude: 2 V
Lead Channel Setting Pacing Amplitude: 2 V
Lead Channel Setting Pacing Pulse Width: 0.5 ms
Lead Channel Setting Pacing Pulse Width: 0.5 ms
Lead Channel Setting Sensing Sensitivity: 0.5 mV
Pulse Gen Serial Number: 7199559

## 2019-12-23 ENCOUNTER — Other Ambulatory Visit: Payer: Self-pay

## 2019-12-23 ENCOUNTER — Ambulatory Visit (INDEPENDENT_AMBULATORY_CARE_PROVIDER_SITE_OTHER): Payer: Medicare HMO

## 2019-12-23 ENCOUNTER — Other Ambulatory Visit: Payer: Self-pay | Admitting: Family Medicine

## 2019-12-23 ENCOUNTER — Telehealth: Payer: Self-pay | Admitting: Family Medicine

## 2019-12-23 DIAGNOSIS — E039 Hypothyroidism, unspecified: Secondary | ICD-10-CM

## 2019-12-23 DIAGNOSIS — Z95810 Presence of automatic (implantable) cardiac defibrillator: Secondary | ICD-10-CM

## 2019-12-23 DIAGNOSIS — I5022 Chronic systolic (congestive) heart failure: Secondary | ICD-10-CM | POA: Diagnosis not present

## 2019-12-23 MED ORDER — LEVOTHYROXINE SODIUM 88 MCG PO TABS: 88.0000 ug | ORAL_TABLET | Freq: Every day | ORAL | 0 refills | Status: DC

## 2019-12-23 NOTE — Telephone Encounter (Signed)
   Notes to clinic:  I am unable to fill under Dr. Pamella Pert  Review for refill   Requested Prescriptions  Pending Prescriptions Disp Refills   levothyroxine (SYNTHROID) 88 MCG tablet 90 tablet 0    Sig: Take 1 tablet (88 mcg total) by mouth at bedtime.      Endocrinology:  Hypothyroid Agents Failed - 12/23/2019  1:14 PM      Failed - TSH needs to be rechecked within 3 months after an abnormal result. Refill until TSH is due.      Passed - TSH in normal range and within 360 days    TSH  Date Value Ref Range Status  10/11/2019 2.530 0.450 - 4.500 uIU/mL Final          Passed - Valid encounter within last 12 months    Recent Outpatient Visits           1 month ago Nocturnal muscle cramps   [facility], Lilia Argue, MD   2 months ago Nocturnal muscle cramps   [facility], Lilia Argue, MD   2 months ago Muscle cramps   [facility], Lilia Argue, MD   3 months ago Non-melanoma skin cancer   [facility], Lilia Argue, MD   4 months ago Skin lesion of neck   [facility], Lilia Argue, MD

## 2019-12-23 NOTE — Telephone Encounter (Signed)
Patient is calling to schedule appt for an A1C check CB- [phone]

## 2019-12-23 NOTE — Telephone Encounter (Signed)
LVM for pt to return call , pt wanted to schedule an appt for an A1c check.

## 2019-12-23 NOTE — Telephone Encounter (Signed)
Medication: levothyroxine (SYNTHROID) 88 MCG tablet [[id number]]   Has the patient contacted their pharmacy? YES (Agent: If no, request that the patient contact the pharmacy for the refill.) (Agent: If yes, when and what did the pharmacy advise?)  Preferred Pharmacy (with phone number or street name): [facility], [address] [place], [place] [address]  Phone:  [phone] Fax:  [phone]   Agent: Please be advised that RX refills may take up to 3 business days. We ask that you follow-up with your pharmacy.

## 2019-12-24 DIAGNOSIS — D122 Benign neoplasm of ascending colon: Secondary | ICD-10-CM | POA: Diagnosis not present

## 2019-12-24 DIAGNOSIS — D123 Benign neoplasm of transverse colon: Secondary | ICD-10-CM | POA: Diagnosis not present

## 2019-12-24 DIAGNOSIS — D125 Benign neoplasm of sigmoid colon: Secondary | ICD-10-CM | POA: Diagnosis not present

## 2019-12-24 DIAGNOSIS — D124 Benign neoplasm of descending colon: Secondary | ICD-10-CM | POA: Diagnosis not present

## 2019-12-24 DIAGNOSIS — D120 Benign neoplasm of cecum: Secondary | ICD-10-CM | POA: Diagnosis not present

## 2019-12-24 NOTE — Progress Notes (Signed)
EPIC Encounter for ICM Monitoring  Patient Name: [name] is a 79 y.o. female Date: 12/24/2019 Primary Care Physican: Rutherford Guys, MD (Inactive) Primary Cardiologist:Berry Electrophysiologist: Vergie Living Pacing: >99% 10/19/2021OfficeWeight:146lbs  Spoke with patient and reports feeling well at this time.  Denies fluid symptoms.  She had a colonoscopy on Friday and drank a lot of fluids for the prep.  Corvue thoracic impedancesuggesting possible fluid accumulation starting 12/20/2019 and returned to baseline day of remote transmission, 12/23/2019.  Prescribed:  Furosemide 40 mgontake 2 tablets (80 mg total) every morning and 1 tablet (40 mg total) every evening.  Potassium 20 mEqOnMonday, Wednesday AND Friday TAKE 1 TAB BY MOUTH 2 TIMES A DAYand on the days you take lasix.  Labs: 10/11/2019 Creatinine 1.16, BUN 19, Potassium 4.6, Sodium 141, GFR 46-52 07/03/2019 Creatinine1.22, BUN16, Potassium4.1, [id number], [id number] 05/14/2019 Creatinine1.31, BUN19, Potassium5.4, [id number], [id number] 04/17/2019 Creatinine 1.22, BUN 16, Potassium 4.3, Sodium 143, GFR 43-49 A complete set of results can be found in Results Review.  Recommendations: No changes and encouraged to call if experiencing any fluid symptoms.  Follow-up plan: ICM clinic phone appointment on11/22/2021. 91 day device clinic remote transmission1/02/2021.   EP/Cardiology Office Visits:Recall for8/26/2022with Dr Caryl Comes.Recall 3/1/2022with Dr. Gwenlyn Found.   Copy of ICM check sent to [place]  3 month ICM trend: 12/23/2019    1 Year ICM trend:       [name], RN 12/24/2019 2:23 PM

## 2019-12-24 NOTE — Progress Notes (Signed)
Remote ICD transmission.   

## 2020-01-08 DIAGNOSIS — M48062 Spinal stenosis, lumbar region with neurogenic claudication: Secondary | ICD-10-CM | POA: Diagnosis not present

## 2020-01-09 ENCOUNTER — Ambulatory Visit: Payer: Medicare HMO | Admitting: Podiatry

## 2020-01-09 ENCOUNTER — Ambulatory Visit (INDEPENDENT_AMBULATORY_CARE_PROVIDER_SITE_OTHER): Payer: Medicare HMO

## 2020-01-09 ENCOUNTER — Other Ambulatory Visit: Payer: Self-pay

## 2020-01-09 DIAGNOSIS — M7742 Metatarsalgia, left foot: Secondary | ICD-10-CM

## 2020-01-09 DIAGNOSIS — M79673 Pain in unspecified foot: Secondary | ICD-10-CM

## 2020-01-09 DIAGNOSIS — R0989 Other specified symptoms and signs involving the circulatory and respiratory systems: Secondary | ICD-10-CM

## 2020-01-09 DIAGNOSIS — M778 Other enthesopathies, not elsewhere classified: Secondary | ICD-10-CM

## 2020-01-09 DIAGNOSIS — M7741 Metatarsalgia, right foot: Secondary | ICD-10-CM | POA: Diagnosis not present

## 2020-01-09 DIAGNOSIS — G8929 Other chronic pain: Secondary | ICD-10-CM | POA: Diagnosis not present

## 2020-01-13 ENCOUNTER — Other Ambulatory Visit: Payer: Self-pay | Admitting: Neurological Surgery

## 2020-01-13 ENCOUNTER — Encounter (HOSPITAL_COMMUNITY): Payer: Medicare HMO

## 2020-01-13 DIAGNOSIS — M48062 Spinal stenosis, lumbar region with neurogenic claudication: Secondary | ICD-10-CM

## 2020-01-14 DIAGNOSIS — Z961 Presence of intraocular lens: Secondary | ICD-10-CM | POA: Diagnosis not present

## 2020-01-14 DIAGNOSIS — H524 Presbyopia: Secondary | ICD-10-CM | POA: Diagnosis not present

## 2020-01-14 DIAGNOSIS — E119 Type 2 diabetes mellitus without complications: Secondary | ICD-10-CM | POA: Diagnosis not present

## 2020-01-14 LAB — HM DIABETES EYE EXAM

## 2020-01-14 LAB — HM COLONOSCOPY

## 2020-01-14 NOTE — Progress Notes (Signed)
Subjective: 79 year old female presents the office today for concerns of pain to the balls of both of her feet. She said the left side is worse than the right she describes soreness and constant aching to the foot. He gets worse after walking longer distances. She states it feels like the bones are sore. To be any worse the last couple weeks. Last A1c was 6.2 on 10/11/2019. No recent injury or falls. She does occasionally get some cramping and pain to her legs. She does have neuropathy. Denies any systemic complaints such as fevers, chills, nausea, vomiting. No acute changes since last appointment, and no other complaints at this time.   Objective: AAO x3, NAD DP/PT pulses palpable 1/4 bilaterally, CRT less than 3 seconds There is mild tenderness palpation diffusely submetatarsal 1 through 5 bilaterally with the left side worse than right. There is no specific area of pinpoint tenderness. There is no edema, erythema. There is no palpable neuroma identified today. MMT 5/5. No pain with calf compression, swelling, warmth, erythema  Assessment: Metatarsalgia, decreased pulses  Plan: -All treatment options discussed with the patient including all alternatives, risks, complications.  -X-rays were obtained and reviewed. There is no evidence of acute fracture identified today. -Given the decreased pulses and history of diabetes I would order arterial studies. -Dispensed metatarsal offloading pads. Discussion modifications also orthotics. -Patient encouraged to call the office with any questions, concerns, change in symptoms.   [name] DPM

## 2020-01-17 ENCOUNTER — Other Ambulatory Visit (HOSPITAL_COMMUNITY): Payer: Self-pay | Admitting: Neurological Surgery

## 2020-01-17 DIAGNOSIS — M48062 Spinal stenosis, lumbar region with neurogenic claudication: Secondary | ICD-10-CM

## 2020-01-20 ENCOUNTER — Encounter (HOSPITAL_COMMUNITY): Payer: Medicare HMO

## 2020-01-24 ENCOUNTER — Encounter: Payer: Medicare HMO | Admitting: Registered Nurse

## 2020-01-27 ENCOUNTER — Ambulatory Visit (INDEPENDENT_AMBULATORY_CARE_PROVIDER_SITE_OTHER): Payer: Medicare HMO | Admitting: Registered Nurse

## 2020-01-27 ENCOUNTER — Other Ambulatory Visit: Payer: Self-pay

## 2020-01-27 ENCOUNTER — Ambulatory Visit (INDEPENDENT_AMBULATORY_CARE_PROVIDER_SITE_OTHER): Payer: Medicare HMO

## 2020-01-27 ENCOUNTER — Encounter: Payer: Self-pay | Admitting: Registered Nurse

## 2020-01-27 VITALS — BP 109/55 | HR 78 | Temp 98.2°F | Resp 18 | Ht 65.0 in | Wt 146.2 lb

## 2020-01-27 DIAGNOSIS — Z794 Long term (current) use of insulin: Secondary | ICD-10-CM | POA: Diagnosis not present

## 2020-01-27 DIAGNOSIS — Z95810 Presence of automatic (implantable) cardiac defibrillator: Secondary | ICD-10-CM

## 2020-01-27 DIAGNOSIS — I2510 Atherosclerotic heart disease of native coronary artery without angina pectoris: Secondary | ICD-10-CM | POA: Diagnosis not present

## 2020-01-27 DIAGNOSIS — Z23 Encounter for immunization: Secondary | ICD-10-CM | POA: Diagnosis not present

## 2020-01-27 DIAGNOSIS — I5022 Chronic systolic (congestive) heart failure: Secondary | ICD-10-CM | POA: Diagnosis not present

## 2020-01-27 DIAGNOSIS — E119 Type 2 diabetes mellitus without complications: Secondary | ICD-10-CM | POA: Diagnosis not present

## 2020-01-27 DIAGNOSIS — R202 Paresthesia of skin: Secondary | ICD-10-CM

## 2020-01-27 DIAGNOSIS — E039 Hypothyroidism, unspecified: Secondary | ICD-10-CM | POA: Diagnosis not present

## 2020-01-27 MED ORDER — LEVOTHYROXINE SODIUM 88 MCG PO TABS: 88.0000 ug | ORAL_TABLET | Freq: Every day | ORAL | 0 refills | Status: DC

## 2020-01-27 MED ORDER — CARVEDILOL 12.5 MG PO TABS: ORAL_TABLET | ORAL | 2 refills | Status: DC

## 2020-01-27 MED ORDER — LEVEMIR FLEXTOUCH 100 UNIT/ML ~~LOC~~ SOPN: PEN_INJECTOR | SUBCUTANEOUS | 0 refills | Status: DC

## 2020-01-27 MED ORDER — FUROSEMIDE 40 MG PO TABS: ORAL_TABLET | ORAL | 1 refills | Status: DC

## 2020-01-27 MED ORDER — TRAZODONE HCL 50 MG PO TABS: 25.0000 mg | ORAL_TABLET | Freq: Every day | ORAL | 1 refills | Status: DC

## 2020-01-27 MED ORDER — ROPINIROLE HCL 0.5 MG PO TABS: 0.5000 mg | ORAL_TABLET | Freq: Every day | ORAL | 3 refills | Status: DC

## 2020-01-27 MED ORDER — SIMVASTATIN 20 MG PO TABS: ORAL_TABLET | ORAL | 3 refills | Status: DC

## 2020-01-27 MED ORDER — CLOPIDOGREL BISULFATE 75 MG PO TABS: 75.0000 mg | ORAL_TABLET | Freq: Every day | ORAL | 3 refills | Status: DC

## 2020-01-27 NOTE — Progress Notes (Signed)
Established Patient Office Visit  Subjective:  Patient ID: [name], female    DOB: Jul 14, 1940  Age: 79 y.o. MRN: [id number]  CC:  Chief Complaint  Patient presents with  . Labs Only    Patient would like to discuss getting labs done and also getting a medication refill.    HPI [name] presents for med check and labs  Is very concerned about her sugars and tsh - states that she has had a very poor diet lately. No symptoms of hyperglycemia or thyroid dysfunction.   Also has some questions about chronic conditions and smoking cessation.  Past Medical History:  Diagnosis Date  . Anxiety   . Arthritis   . Chronic pain    a. Prior h/o chronic pain on methadone.  . Chronic systolic CHF (congestive heart failure) (HCC)    a. mixed ischemic/non-ischemic cardiomyopathy. b. s/p CRT-D in 06/2014.  Marland Kitchen CKD (chronic kidney disease), stage III ([place])   . Complication of anesthesia    hard time waking her up from general surgery  . Coronary artery disease    a. remote RCA stenting in 2008 with non-DES. b. Cath 06/2014 following abnormal nuc: Stable, unchanged from prior cath, patent stent and 40% LM.  . Diabetes mellitus ([place])   . GERD (gastroesophageal reflux disease)   . Headache   . Hyperlipidemia   . Hypertension   . LBBB (left bundle branch block)   . Nonischemic cardiomyopathy ([place])   . OSA (obstructive sleep apnea)    AHI-9.77/hr, during REM-50.32/hr  . Tobacco abuse     Past Surgical History:  Procedure Laterality Date  . BACK SURGERY  2013  . BI-VENTRICULAR IMPLANTABLE CARDIOVERTER DEFIBRILLATOR N/A 06/11/2014   STJ CRTD implanted by Dr Caryl Comes  . CARDIAC CATHETERIZATION  12/14/2006   RCA stented with a 3.0 [employer] Liberte stent resulting in a reduction of 75% to 0% residual  . CHOLECYSTECTOMY     20 years ago  . COLONOSCOPY WITH PROPOFOL N/A 12/15/2014   Procedure: COLONOSCOPY WITH PROPOFOL;  Surgeon: Clarene Essex, MD;  Location: [facility] ENDOSCOPY;  Service:  Endoscopy;  Laterality: N/A;  . EYE SURGERY     bilateral cataract surgery   . LEFT AND RIGHT HEART CATHETERIZATION WITH CORONARY ANGIOGRAM N/A 05/29/2014   Procedure: LEFT AND RIGHT HEART CATHETERIZATION WITH CORONARY ANGIOGRAM;  Surgeon: Lorretta Harp, MD;  Location: [facility] CATH LAB;  Service: Cardiovascular;  Laterality: N/A;  . LEFT HEART CATHETERIZATION WITH CORONARY ANGIOGRAM N/A 12/27/2012   Procedure: LEFT HEART CATHETERIZATION WITH CORONARY ANGIOGRAM;  Surgeon: Lorretta Harp, MD;  Location: [facility] CATH LAB;  Service: Cardiovascular;  Laterality: N/A;    Family History  Problem Relation Age of Onset  . Stroke Mother   . Hypertension Mother   . Coronary artery disease Father   . Stroke Brother   . Heart disease Brother   . Other Brother        H1N1 VIRUS  . Healthy Sister   . Healthy Sister     Social History   Socioeconomic History  . Marital status: Widowed    Spouse name: Not on file  . Number of children: Not on file  . Years of education: Not on file  . Highest education level: Not on file  Occupational History  . Not on file  Tobacco Use  . Smoking status: Current Every Day Smoker    Packs/day: 2.00    Years: 58.00    Pack years: 116.00  Types: Cigarettes    Start date: 12/26/1952    Last attempt to quit: 12/05/2013    Years since quitting: 6.1  . Smokeless tobacco: Never Used  . Tobacco comment: uses Vape cigarettes  Vaping Use  . Vaping Use: Never used  Substance and Sexual Activity  . Alcohol use: No    Alcohol/week: 0.0 standard drinks  . Drug use: No  . Sexual activity: Not on file  Other Topics Concern  . Not on file  Social History Narrative   Patient lives in [place] with his husband and grandson.   Son completed suicide on September 17th of this year, patient found him.   Social Determinants of Health   Financial Resource Strain:   . Difficulty of Paying Living Expenses: Not on file  Food Insecurity:   . Worried About Sales executive in the Last Year: Not on file  . Ran Out of Food in the Last Year: Not on file  Transportation Needs:   . Lack of Transportation (Medical): Not on file  . Lack of Transportation (Non-Medical): Not on file  Physical Activity:   . Days of Exercise per Week: Not on file  . Minutes of Exercise per Session: Not on file  Stress:   . Feeling of Stress : Not on file  Social Connections:   . Frequency of Communication with Friends and Family: Not on file  . Frequency of Social Gatherings with Friends and Family: Not on file  . Attends Religious Services: Not on file  . Active Member of Clubs or Organizations: Not on file  . Attends Archivist Meetings: Not on file  . Marital Status: Not on file  Intimate Partner Violence:   . Fear of Current or Ex-Partner: Not on file  . Emotionally Abused: Not on file  . Physically Abused: Not on file  . Sexually Abused: Not on file    Outpatient Medications Prior to Visit  Medication Sig Dispense Refill  . aspirin EC 81 MG tablet Take 81 mg by mouth at bedtime.    . blood glucose meter kit and supplies KIT Dispense based on patient and insurance preference. Use three times a day as directed. Dx. E11.65, Z79.4 1 each 11  . Cholecalciferol (VITAMIN D3) 1.25 MG (50000 UT) TABS Take by mouth.    . Garlic 300 MG CAPS Take by mouth.    . insulin aspart (NOVOLOG FLEXPEN) 100 UNIT/ML FlexPen Per insulin sliding scale, max TTD, 20 units. 15 mL 11  . Insulin Pen Needle (BD PEN NEEDLE MICRO U/F) 32G X 6 MM MISC USE NEW NEEDLE FOR EACH INJECTION OF INSULIN, FOUR TIMES DAILY 100 each 5  . ipratropium (ATROVENT) 0.03 % nasal spray Place 2 sprays into both nostrils 2 (two) times daily. 30 mL 3  . mupirocin ointment (BACTROBAN) 2 % Apply 1 application topically 3 (three) times daily. 22 g 0  . nitroGLYCERIN (NITROSTAT) 0.4 MG SL tablet Place 1 tablet (0.4 mg total) under the tongue every 5 (five) minutes as needed for chest pain. 25 tablet 3  . ONE TOUCH  ULTRA TEST test strip   11  . ONETOUCH DELICA LANCETS 76A MISC   11  . oxyCODONE-acetaminophen (PERCOCET/ROXICET) 5-325 MG tablet Take by mouth every 4 (four) hours as needed for severe pain.    . potassium chloride SA (KLOR-CON) 20 MEQ tablet TAKE 1 TAB BY MOUTH 2 TIMES A DAY ON Monday, Wednesday AND Friday  (days you take Lasix) 180 tablet  0  . pyridOXINE (VITAMIN B-6) 100 MG tablet Take 100 mg by mouth daily.    Marland Kitchen triamcinolone cream (KENALOG) 0.1 % APPLY ONE APPLICATION TOPICALLY TWO TIMES DAILY 30 g 0  . carvedilol (COREG) 12.5 MG tablet TAKE 1 1/2 TABLET BY MOUTH TWICE A DAY 270 tablet 2  . clopidogrel (PLAVIX) 75 MG tablet Take 1 tablet by mouth once daily 90 tablet 3  . furosemide (LASIX) 40 MG tablet Take 2 tablets (80 mg total) by mouth every morning AND 1 tablet (40 mg total) every evening. 80 MG IN THE AM AND 40MG IN THE PM.. 180 tablet 1  . LEVEMIR FLEXTOUCH 100 UNIT/ML FlexPen INJECT 16 UNITS SUBCUTANEOUSLY ONCE DAILY AT  10  PM 15 mL 0  . levothyroxine (SYNTHROID) 88 MCG tablet Take 1 tablet (88 mcg total) by mouth at bedtime. 90 tablet 0  . rOPINIRole (REQUIP) 0.5 MG tablet Take 1 tablet (0.5 mg total) by mouth at bedtime. 30 tablet 3  . simvastatin (ZOCOR) 20 MG tablet TAKE 1 TABLET BY MOUTH ONCE DAILY WITH SUPPER 90 tablet 3  . traZODone (DESYREL) 50 MG tablet Take 0.5-1 tablets (25-50 mg total) by mouth at bedtime. 90 tablet 1   No facility-administered medications prior to visit.    Allergies  Allergen Reactions  . Valium [Diazepam] Swelling    face    ROS Review of Systems  Constitutional: Negative.   HENT: Negative.   Eyes: Negative.   Respiratory: Negative.   Cardiovascular: Negative.   Gastrointestinal: Negative.   Genitourinary: Negative.   Musculoskeletal: Negative.   Skin: Negative.   Neurological: Negative.   Psychiatric/Behavioral: Negative.       Objective:    Physical Exam Vitals and nursing note reviewed.  Constitutional:      General: She  is not in acute distress.    Appearance: Normal appearance. She is normal weight. She is not ill-appearing, toxic-appearing or diaphoretic.  Cardiovascular:     Rate and Rhythm: Normal rate and regular rhythm.     Heart sounds: Normal heart sounds. No murmur heard.  No friction rub. No gallop.   Pulmonary:     Effort: Pulmonary effort is normal. No respiratory distress.     Breath sounds: Normal breath sounds. No stridor. No wheezing, rhonchi or rales.  Chest:     Chest wall: No tenderness.  Skin:    General: Skin is warm and dry.  Neurological:     General: No focal deficit present.     Mental Status: She is alert and oriented to person, place, and time. Mental status is at baseline.  Psychiatric:        Mood and Affect: Mood normal.        Behavior: Behavior normal.        Thought Content: Thought content normal.        Judgment: Judgment normal.     BP (!) 109/55   Pulse 78   Temp 98.2 F (36.8 C) (Temporal)   Resp 18   Ht '5\' 5"'  (1.651 m)   Wt 146 lb 3.2 oz (66.3 kg)   SpO2 97%   BMI 24.33 kg/m  Wt Readings from Last 3 Encounters:  01/27/20 146 lb 3.2 oz (66.3 kg)  11/05/19 146 lb 12.8 oz (66.6 kg)  10/31/19 143 lb (64.9 kg)     There are no preventive care reminders to display for this patient.  There are no preventive care reminders to display for this patient.  Lab Results  Component Value Date   TSH 2.530 10/11/2019   Lab Results  Component Value Date   WBC 6.8 05/14/2019   HGB 15.5 05/14/2019   HCT 44.9 05/14/2019   MCV 94 05/14/2019   PLT 235 05/14/2019   Lab Results  Component Value Date   NA 141 10/11/2019   K 4.6 10/11/2019   CO2 26 10/11/2019   GLUCOSE 113 (H) 10/11/2019   BUN 19 10/11/2019   CREATININE 1.16 (H) 10/11/2019   BILITOT 0.4 10/11/2019   ALKPHOS 78 10/11/2019   AST 15 10/11/2019   ALT 11 10/11/2019   PROT 6.3 10/11/2019   ALBUMIN 4.4 10/11/2019   CALCIUM 9.7 10/11/2019   ANIONGAP 12 08/31/2016   GFR 35.10 (L) 10/06/2014    Lab Results  Component Value Date   CHOL 150 10/11/2019   Lab Results  Component Value Date   HDL 53 10/11/2019   Lab Results  Component Value Date   LDLCALC 78 10/11/2019   Lab Results  Component Value Date   TRIG 107 10/11/2019   Lab Results  Component Value Date   CHOLHDL 2.8 10/11/2019   Lab Results  Component Value Date   HGBA1C 6.2 (H) 10/11/2019      Assessment & Plan:   Problem List Items Addressed This Visit      Cardiovascular and Mediastinum   Chronic systolic CHF (congestive heart failure) (HCC)   Relevant Medications   furosemide (LASIX) 40 MG tablet   carvedilol (COREG) 12.5 MG tablet   simvastatin (ZOCOR) 20 MG tablet     Endocrine   Hypothyroidism   Relevant Medications   levothyroxine (SYNTHROID) 88 MCG tablet   carvedilol (COREG) 12.5 MG tablet   Insulin dependent type 2 diabetes mellitus (HCC)   Relevant Medications   insulin detemir (LEVEMIR FLEXTOUCH) 100 UNIT/ML FlexPen   simvastatin (ZOCOR) 20 MG tablet   Other Relevant Orders   TSH   Hemoglobin A4T   Basic Metabolic Panel    Other Visit Diagnoses    Flu vaccine need    -  Primary   Relevant Orders   Flu Vaccine QUAD High Dose(Fluad) (Completed)   Coronary artery disease involving native coronary artery of native heart without angina pectoris       Relevant Medications   furosemide (LASIX) 40 MG tablet   carvedilol (COREG) 12.5 MG tablet   clopidogrel (PLAVIX) 75 MG tablet   simvastatin (ZOCOR) 20 MG tablet   Paresthesia of both feet       Relevant Medications   traZODone (DESYREL) 50 MG tablet   rOPINIRole (REQUIP) 0.5 MG tablet      Meds ordered this encounter  Medications  . levothyroxine (SYNTHROID) 88 MCG tablet    Sig: Take 1 tablet (88 mcg total) by mouth at bedtime.    Dispense:  90 tablet    Refill:  0    Order Specific Question:   Supervising Provider    Answer:   Carlota Raspberry, JEFFREY R [[id number]]  . furosemide (LASIX) 40 MG tablet    Sig: Take 2 tablets (80 mg  total) by mouth every morning AND 1 tablet (40 mg total) every evening. 80 MG IN THE AM AND 40MG IN THE PM..    Dispense:  180 tablet    Refill:  1    Order Specific Question:   Supervising Provider    Answer:   Carlota Raspberry, JEFFREY R [[id number]]  . traZODone (DESYREL) 50 MG tablet    Sig: Take 0.5-1 tablets (25-50 mg  total) by mouth at bedtime.    Dispense:  90 tablet    Refill:  1    Order Specific Question:   Supervising Provider    Answer:   Carlota Raspberry, JEFFREY R [[id number]]  . rOPINIRole (REQUIP) 0.5 MG tablet    Sig: Take 1 tablet (0.5 mg total) by mouth at bedtime.    Dispense:  30 tablet    Refill:  3    Order Specific Question:   Supervising Provider    Answer:   Carlota Raspberry, JEFFREY R [[id number]]  . carvedilol (COREG) 12.5 MG tablet    Sig: TAKE 1 1/2 TABLET BY MOUTH TWICE A DAY    Dispense:  270 tablet    Refill:  2    Order Specific Question:   Supervising Provider    Answer:   Carlota Raspberry, JEFFREY R [[id number]]  . clopidogrel (PLAVIX) 75 MG tablet    Sig: Take 1 tablet (75 mg total) by mouth daily.    Dispense:  90 tablet    Refill:  3    Order Specific Question:   Supervising Provider    Answer:   Carlota Raspberry, JEFFREY R [[id number]]  . insulin detemir (LEVEMIR FLEXTOUCH) 100 UNIT/ML FlexPen    Sig: INJECT 16 UNITS SUBCUTANEOUSLY ONCE DAILY AT  10  PM    Dispense:  15 mL    Refill:  0    Order Specific Question:   Supervising Provider    Answer:   Carlota Raspberry, JEFFREY R [[id number]]  . simvastatin (ZOCOR) 20 MG tablet    Sig: TAKE 1 TABLET BY MOUTH ONCE DAILY WITH SUPPER    Dispense:  90 tablet    Refill:  3    Order Specific Question:   Supervising Provider    Answer:   Carlota Raspberry, JEFFREY R [[id number]]    Follow-up: No follow-ups on file.    PLAN  Refills sent  Questions answered and concerns addressed  Return to clinic in 6 mo  Patient encouraged to call clinic with any questions, comments, or concerns.  Maximiano Coss, NP

## 2020-01-27 NOTE — Patient Instructions (Signed)
° ° ° °  If you have lab work done today you will be contacted with your lab results within the next 2 weeks.  If you have not heard from us then please contact us. The fastest way to get your results is to register for My Chart. ° ° °IF you received an x-ray today, you will receive an invoice from [facility]. Please contact [facility] at [phone] with questions or concerns regarding your invoice.  ° °IF you received labwork today, you will receive an invoice from [facility]. Please contact [facility] at [phone] with questions or concerns regarding your invoice.  ° °Our billing staff will not be able to assist you with questions regarding bills from these companies. ° °You will be contacted with the lab results as soon as they are available. The fastest way to get your results is to activate your My Chart account. Instructions are located on the last page of this paperwork. If you have not heard from us regarding the results in 2 weeks, please contact this office. °  ° ° ° °

## 2020-01-28 LAB — BASIC METABOLIC PANEL
BUN/Creatinine Ratio: 15 (ref 12–28)
BUN: 17 mg/dL (ref 8–27)
CO2: 28 mmol/L (ref 20–29)
Calcium: 9.8 mg/dL (ref 8.7–10.3)
Chloride: 98 mmol/L (ref 96–106)
Creatinine, Ser: 1.13 mg/dL — ABNORMAL HIGH (ref 0.57–1.00)
GFR calc Af Amer: 54 mL/min/{1.73_m2} — ABNORMAL LOW (ref >59)
GFR calc non Af Amer: 47 mL/min/{1.73_m2} — ABNORMAL LOW (ref >59)
Glucose: 118 mg/dL — ABNORMAL HIGH (ref 65–99)
Potassium: 4.4 mmol/L (ref 3.5–5.2)
Sodium: 139 mmol/L (ref 134–144)

## 2020-01-28 LAB — TSH: TSH: 3.17 u[IU]/mL (ref 0.450–4.500)

## 2020-01-28 LAB — HEMOGLOBIN A1C
Est. average glucose Bld gHb Est-mCnc: 123 mg/dL
Hgb A1c MFr Bld: 5.9 % — ABNORMAL HIGH (ref 4.8–5.6)

## 2020-01-29 NOTE — Progress Notes (Signed)
EPIC Encounter for ICM Monitoring  Patient Name: [name] is a 79 y.o. female Date: 01/29/2020 Primary Care Physican: Rutherford Guys, MD (Inactive) Primary Cardiologist:Berry Electrophysiologist: Vergie Living Pacing: >99% 10/19/2021OfficeWeight:146lbs  Spoke with patient and reports feeling well at this time.  Denies fluid symptoms.    Corvue thoracic impedancesuggesting normal fluid levels.  Prescribed:  Furosemide 40 mgontake 2 tablets (80 mg total) every morning and 1 tablet (40 mg total) every evening.  Potassium 20 mEqOnMonday, Wednesday AND Friday TAKE 1 TAB BY MOUTH 2 TIMES A DAYand on the days you take lasix.  Labs: 10/11/2019 Creatinine 1.16, BUN 19, Potassium 4.6, Sodium 141, GFR 46-52 07/03/2019 Creatinine1.22, BUN16, Potassium4.1, [id number], [id number] 05/14/2019 Creatinine1.31, BUN19, Potassium5.4, [id number], VQX45-03 04/17/2019 Creatinine 1.22, BUN 16, Potassium 4.3, Sodium 143, GFR 43-49 A complete set of results can be found in Results Review.  Recommendations:No changes and encouraged to call if experiencing any fluid symptoms.  Follow-up plan: ICM clinic phone appointment on1/05/2020. 91 day device clinic remote transmission1/02/2021.   EP/Cardiology Office Visits:Recall for8/26/2022with Dr Caryl Comes.Recall 3/1/2022with Dr. Gwenlyn Found.   Copy of ICM check sent to [facility]  3 month ICM trend: 01/28/2020    1 Year ICM trend:       [name], RN 01/29/2020 9:01 AM

## 2020-02-03 ENCOUNTER — Encounter (HOSPITAL_COMMUNITY): Payer: Self-pay

## 2020-02-03 ENCOUNTER — Ambulatory Visit (HOSPITAL_COMMUNITY): Admission: RE | Admit: 2020-02-03 | Payer: Medicare HMO | Source: Ambulatory Visit

## 2020-02-04 ENCOUNTER — Telehealth (INDEPENDENT_AMBULATORY_CARE_PROVIDER_SITE_OTHER): Payer: Medicare HMO | Admitting: Family Medicine

## 2020-02-04 ENCOUNTER — Encounter: Payer: Self-pay | Admitting: Family Medicine

## 2020-02-04 DIAGNOSIS — R059 Cough, unspecified: Secondary | ICD-10-CM | POA: Diagnosis not present

## 2020-02-04 MED ORDER — BENZONATATE 200 MG PO CAPS: 200.0000 mg | ORAL_CAPSULE | Freq: Two times a day (BID) | ORAL | 0 refills | Status: DC | PRN

## 2020-02-04 MED ORDER — MUCINEX DM MAXIMUM STRENGTH 60-1200 MG PO TB12: 1.0000 | ORAL_TABLET | Freq: Two times a day (BID) | ORAL | 1 refills | Status: DC

## 2020-02-04 NOTE — Patient Instructions (Signed)

## 2020-02-04 NOTE — Progress Notes (Signed)
Virtual Visit Note  I connected with patient on 02/04/20 at 1500 by telephone due to inability to work Epic video visit and verified that I am speaking with the correct person using two identifiers. [name] is currently located at home and no family members are currently with them during visit. The provider, Laurita Quint Luismario Coston, FNP is located in their office at time of visit.  I discussed the limitations, risks, security and privacy concerns of performing an evaluation and management service by telephone and the availability of in person appointments. I also discussed with the patient that there may be a patient responsible charge related to this service. The patient expressed understanding and agreed to proceed.   I provided 10 minutes of non-face-to-face, 10 minutes face-to-face time during this encounter.  Chief Complaint  Patient presents with  . Cough    uncontrolled bouts lasting 15 mins approx, non productive- clear at times x 3 weeks worsened      HPI  Has been coughing, uncontrollable Long cough spells 39mn Sounds like lots of congestion, but not coughing anything up When she does cough something up its clear Feels congested in her cough Ribs are sore from coughing Going on for 3 weeks? Has not been taking anything for it Smoker Doesn't sleep at baseline   Allergies  Allergen Reactions  . Valium [Diazepam] Swelling    face    Prior to Admission medications   Medication Sig Start Date End Date Taking? Authorizing Provider  aspirin EC 81 MG tablet Take 81 mg by mouth at bedtime.    [provider]  blood glucose meter kit and supplies KIT Dispense based on patient and insurance preference. Use three times a day as directed. Dx. E11.65, Z79.4 10/11/19   SRutherford Guys MD  carvedilol (COREG) 12.5 MG tablet TAKE 1 1/2 TABLET BY MOUTH TWICE A DAY 01/27/20   MMaximiano Coss NP  Cholecalciferol (VITAMIN D3) 1.25 MG (50000 UT) TABS Take by mouth.    [provider]  clopidogrel (PLAVIX) 75 MG tablet Take 1 tablet (75 mg total) by mouth daily. 01/27/20   MMaximiano Coss NP  furosemide (LASIX) 40 MG tablet Take 2 tablets (80 mg total) by mouth every morning AND 1 tablet (40 mg total) every evening. 80 MG IN THE AM AND 40MG IN THE PM.. 01/27/20   MMaximiano Coss NP  Garlic 5372MG CAPS Take by mouth.    [provider]  insulin aspart (NOVOLOG FLEXPEN) 100 UNIT/ML FlexPen Per insulin sliding scale, max TTD, 20 units. 06/26/17   SRutherford Guys MD  insulin detemir (LEVEMIR FLEXTOUCH) 100 UNIT/ML FlexPen INJECT 16 UNITS SUBCUTANEOUSLY ONCE DAILY AT  10  PM 01/27/20   MMaximiano Coss NP  Insulin Pen Needle (BD PEN NEEDLE MICRO U/F) 32G X 6 MM MISC USE NEW NEEDLE FOR EACH INJECTION OF INSULIN, FOUR TIMES DAILY 04/30/19   SRutherford Guys MD  ipratropium (ATROVENT) 0.03 % nasal spray Place 2 sprays into both nostrils 2 (two) times daily. 05/23/19   SRutherford Guys MD  levothyroxine (SYNTHROID) 88 MCG tablet Take 1 tablet (88 mcg total) by mouth at bedtime. 01/27/20   MMaximiano Coss NP  mupirocin ointment (BACTROBAN) 2 % Apply 1 application topically 3 (three) times daily. 07/29/19   SRutherford Guys MD  nitroGLYCERIN (NITROSTAT) 0.4 MG SL tablet Place 1 tablet (0.4 mg total) under the tongue every 5 (five) minutes as needed for chest pain. 05/06/19   KKerin Ransom  K, PA-C  ONE TOUCH ULTRA TEST test strip  06/07/17   [provider]  Jonetta Speak LANCETS 99J [place]  06/07/17   [provider]  oxyCODONE-acetaminophen (PERCOCET/ROXICET) 5-325 MG tablet Take by mouth every 4 (four) hours as needed for severe pain.    [provider]  potassium chloride SA (KLOR-CON) 20 MEQ tablet TAKE 1 TAB BY MOUTH 2 TIMES A DAY ON Monday, Wednesday AND Friday  (days you take Lasix) 05/21/19   Kilroy, Doreene Burke, PA-C  pyridOXINE (VITAMIN B-6) 100 MG tablet Take 100 mg by mouth daily.    [provider]  rOPINIRole (REQUIP) 0.5 MG  tablet Take 1 tablet (0.5 mg total) by mouth at bedtime. 01/27/20   Maximiano Coss, NP  simvastatin (ZOCOR) 20 MG tablet TAKE 1 TABLET BY MOUTH ONCE DAILY WITH SUPPER 01/27/20   Maximiano Coss, NP  traZODone (DESYREL) 50 MG tablet Take 0.5-1 tablets (25-50 mg total) by mouth at bedtime. 01/27/20   Maximiano Coss, NP  triamcinolone cream (KENALOG) 0.1 % APPLY ONE APPLICATION TOPICALLY TWO TIMES DAILY 11/28/18   Rutherford Guys, MD    Past Medical History:  Diagnosis Date  . Anxiety   . Arthritis   . Chronic pain    a. Prior h/o chronic pain on methadone.  . Chronic systolic CHF (congestive heart failure) (HCC)    a. mixed ischemic/non-ischemic cardiomyopathy. b. s/p CRT-D in 06/2014.  Marland Kitchen CKD (chronic kidney disease), stage III ([place])   . Complication of anesthesia    hard time waking her up from general surgery  . Coronary artery disease    a. remote RCA stenting in 2008 with non-DES. b. Cath 06/2014 following abnormal nuc: Stable, unchanged from prior cath, patent stent and 40% LM.  . Diabetes mellitus ([place])   . GERD (gastroesophageal reflux disease)   . Headache   . Hyperlipidemia   . Hypertension   . LBBB (left bundle branch block)   . Nonischemic cardiomyopathy ([place])   . OSA (obstructive sleep apnea)    AHI-9.77/hr, during REM-50.32/hr  . Tobacco abuse     Past Surgical History:  Procedure Laterality Date  . BACK SURGERY  2013  . BI-VENTRICULAR IMPLANTABLE CARDIOVERTER DEFIBRILLATOR N/A 06/11/2014   STJ CRTD implanted by Dr Caryl Comes  . CARDIAC CATHETERIZATION  12/14/2006   RCA stented with a 3.0 [employer] Liberte stent resulting in a reduction of 75% to 0% residual  . CHOLECYSTECTOMY     20 years ago  . COLONOSCOPY WITH PROPOFOL N/A 12/15/2014   Procedure: COLONOSCOPY WITH PROPOFOL;  Surgeon: Clarene Essex, MD;  Location: [facility]COPY;  Service: Endoscopy;  Laterality: N/A;  . EYE SURGERY     bilateral cataract surgery   . LEFT AND RIGHT HEART CATHETERIZATION WITH  CORONARY ANGIOGRAM N/A 05/29/2014   Procedure: LEFT AND RIGHT HEART CATHETERIZATION WITH CORONARY ANGIOGRAM;  Surgeon: Lorretta Harp, MD;  Location: [facility] CATH LAB;  Service: Cardiovascular;  Laterality: N/A;  . LEFT HEART CATHETERIZATION WITH CORONARY ANGIOGRAM N/A 12/27/2012   Procedure: LEFT HEART CATHETERIZATION WITH CORONARY ANGIOGRAM;  Surgeon: Lorretta Harp, MD;  Location: [facility] CATH LAB;  Service: Cardiovascular;  Laterality: N/A;    Social History   Tobacco Use  . Smoking status: Current Every Day Smoker    Packs/day: 2.00    Years: 58.00    Pack years: 116.00    Types: Cigarettes    Start date: 12/26/1951    Last attempt to quit: 12/05/2013    Years since  quitting: 6.1  . Smokeless tobacco: Never Used  . Tobacco comment: uses Vape cigarettes  Substance Use Topics  . Alcohol use: No    Alcohol/week: 0.0 standard drinks    Family History  Problem Relation Age of Onset  . Stroke Mother   . Hypertension Mother   . Coronary artery disease Father   . Stroke Brother   . Heart disease Brother   . Other Brother        H1N1 VIRUS  . Healthy Sister   . Healthy Sister     Review of Systems  Constitutional: Negative for chills, fever and malaise/fatigue.  HENT: Negative for congestion, sinus pain and sore throat.   Eyes: Negative for blurred vision and double vision.  Respiratory: Positive for cough and sputum production (Thin clear). Negative for shortness of breath and wheezing.   Cardiovascular: Negative for chest pain, palpitations and leg swelling.  Gastrointestinal: Negative for abdominal pain, constipation, diarrhea, heartburn, nausea and vomiting.  Musculoskeletal: Negative for back pain and joint pain.  Skin: Negative for rash.  Neurological: Negative for weakness and headaches.    Objective  Constitutional:      General: She is not in acute distress.    Appearance: Normal appearance. She is not ill-appearing.   Pulmonary:     Effort: Pulmonary effort is  normal. No respiratory distress.  Neurological:     Mental Status: She is alert and oriented to person, place, and time.  Psychiatric:        Mood and Affect: Mood normal.        Behavior: Behavior normal.    ASSESSMENT and PLAN  Problem List Items Addressed This Visit      Other   Cough - Primary   Relevant Medications   benzonatate (TESSALON) 200 MG capsule   Dextromethorphan-guaiFENesin (MUCINEX DM MAXIMUM STRENGTH) 60-1200 MG TB12 Educated on supportive measures Holding off on steroids due to DM2 RTC precautions provided R/se/b of medications discussed       Return in about 4 weeks (around 03/03/2020), or if symptoms worsen or fail to improve.    The above assessment and management plan was discussed with the patient. The patient verbalized understanding of and has agreed to the management plan. Patient is aware to call the clinic if symptoms persist or worsen. Patient is aware when to return to the clinic for a follow-up visit. Patient educated on when it is appropriate to go to the emergency department.     Huston Foley Arlys Scatena, FNP-BC Primary Care at [facility] [place], [place] [address] Ph.  [phone] Fax [phone]

## 2020-02-10 ENCOUNTER — Other Ambulatory Visit: Payer: Self-pay | Admitting: Neurological Surgery

## 2020-02-10 ENCOUNTER — Other Ambulatory Visit (HOSPITAL_COMMUNITY): Payer: Self-pay | Admitting: Neurological Surgery

## 2020-02-10 DIAGNOSIS — M48062 Spinal stenosis, lumbar region with neurogenic claudication: Secondary | ICD-10-CM

## 2020-02-11 ENCOUNTER — Other Ambulatory Visit: Payer: Self-pay | Admitting: Family Medicine

## 2020-02-11 ENCOUNTER — Telehealth: Payer: Self-pay | Admitting: Family Medicine

## 2020-02-11 DIAGNOSIS — R059 Cough, unspecified: Secondary | ICD-10-CM

## 2020-02-11 NOTE — Progress Notes (Unsigned)
cxr 

## 2020-02-11 NOTE — Telephone Encounter (Signed)
Called pt per clinical to let patient know orders have been sent to [place] for chest x-ray l LVM should patient call back

## 2020-02-12 ENCOUNTER — Ambulatory Visit
Admission: RE | Admit: 2020-02-12 | Discharge: 2020-02-12 | Disposition: A | Discharge status: Home / Self Care | Payer: Medicare HMO | Source: Ambulatory Visit | Attending: Family Medicine | Admitting: Family Medicine

## 2020-02-12 ENCOUNTER — Other Ambulatory Visit: Payer: Self-pay

## 2020-02-12 DIAGNOSIS — R059 Cough, unspecified: Secondary | ICD-10-CM | POA: Diagnosis not present

## 2020-02-12 NOTE — Progress Notes (Signed)
If you could let Mrs. Riches know that no abnormalities were noted on her CXR.

## 2020-02-14 ENCOUNTER — Telehealth: Payer: Self-pay | Admitting: Registered Nurse

## 2020-02-14 NOTE — Telephone Encounter (Signed)
Pt would like a call concerning her most recent lab results. Please advise at [phone].

## 2020-02-14 NOTE — Telephone Encounter (Signed)
I have called pt back and relayed the lab results. Pt stated that she has been coughing excessively for the last 4 weeks and wants to know what she should do.   Thanks.

## 2020-02-20 ENCOUNTER — Other Ambulatory Visit: Payer: Self-pay

## 2020-02-20 ENCOUNTER — Ambulatory Visit: Payer: Medicare HMO | Admitting: Podiatry

## 2020-02-20 ENCOUNTER — Ambulatory Visit (HOSPITAL_COMMUNITY)
Admission: RE | Admit: 2020-02-20 | Discharge: 2020-02-20 | Disposition: A | Discharge status: Home / Self Care | Payer: Medicare HMO | Source: Ambulatory Visit | Attending: Podiatry | Admitting: Podiatry

## 2020-02-20 ENCOUNTER — Ambulatory Visit (INDEPENDENT_AMBULATORY_CARE_PROVIDER_SITE_OTHER)
Admission: RE | Admit: 2020-02-20 | Discharge: 2020-02-20 | Disposition: A | Discharge status: Home / Self Care | Payer: Medicare HMO | Source: Ambulatory Visit | Attending: Podiatry | Admitting: Podiatry

## 2020-02-20 ENCOUNTER — Other Ambulatory Visit (HOSPITAL_COMMUNITY): Payer: Self-pay | Admitting: Podiatry

## 2020-02-20 ENCOUNTER — Encounter: Payer: Self-pay | Admitting: Podiatry

## 2020-02-20 ENCOUNTER — Telehealth: Payer: Self-pay | Admitting: Podiatry

## 2020-02-20 ENCOUNTER — Ambulatory Visit (INDEPENDENT_AMBULATORY_CARE_PROVIDER_SITE_OTHER): Payer: Medicare HMO

## 2020-02-20 ENCOUNTER — Encounter (HOSPITAL_COMMUNITY): Payer: Self-pay

## 2020-02-20 DIAGNOSIS — M7741 Metatarsalgia, right foot: Secondary | ICD-10-CM

## 2020-02-20 DIAGNOSIS — M7752 Other enthesopathy of left foot: Secondary | ICD-10-CM | POA: Diagnosis not present

## 2020-02-20 DIAGNOSIS — M7751 Other enthesopathy of right foot: Secondary | ICD-10-CM

## 2020-02-20 DIAGNOSIS — M79604 Pain in right leg: Secondary | ICD-10-CM

## 2020-02-20 DIAGNOSIS — M79671 Pain in right foot: Secondary | ICD-10-CM

## 2020-02-20 DIAGNOSIS — M79605 Pain in left leg: Secondary | ICD-10-CM

## 2020-02-20 DIAGNOSIS — R0989 Other specified symptoms and signs involving the circulatory and respiratory systems: Secondary | ICD-10-CM | POA: Diagnosis not present

## 2020-02-20 DIAGNOSIS — M778 Other enthesopathies, not elsewhere classified: Secondary | ICD-10-CM | POA: Diagnosis not present

## 2020-02-20 DIAGNOSIS — M779 Enthesopathy, unspecified: Secondary | ICD-10-CM

## 2020-02-20 DIAGNOSIS — M7742 Metatarsalgia, left foot: Secondary | ICD-10-CM

## 2020-02-20 DIAGNOSIS — M79672 Pain in left foot: Secondary | ICD-10-CM

## 2020-02-20 MED ORDER — TRIAMCINOLONE ACETONIDE 10 MG/ML IJ SUSP
10.0000 mg | Freq: Once | INTRAMUSCULAR | Status: AC
  Administered 2020-02-20: 10 mg

## 2020-02-20 MED ORDER — POTASSIUM CHLORIDE CRYS ER 20 MEQ PO TBCR: EXTENDED_RELEASE_TABLET | ORAL | 0 refills | Status: DC

## 2020-02-20 NOTE — Telephone Encounter (Signed)
[facility] has requested new order for patient for Lower Extremity Arterial, Please Advise

## 2020-02-21 ENCOUNTER — Other Ambulatory Visit: Payer: Self-pay | Admitting: Family Medicine

## 2020-02-21 DIAGNOSIS — R059 Cough, unspecified: Secondary | ICD-10-CM

## 2020-02-21 NOTE — Progress Notes (Signed)
Subjective:   Patient ID: [name], female   DOB: 79 y.o.   MRN: [id number]   HPI Patient presents stating her feet really hurt her and admits that she still smokes at this time.  States that the forefoot bilateral and that it hurts more at night but it hurts all day also and she did not have her vascular testing done   ROS      Objective:  Physical Exam  Neurovascular status is found to be diminished with diminished pulses DP PT bilateral and indications of claudication symptomatology bilateral with inflammation around the lesser MPJs especially third bilateral     Assessment:  Difficult to say between vascular disease inflammatory capsulitis or combination with smoking as factor     Plan:  H&P reviewed conditions and at this point I am going to try injection treatment with 3 mg Dexasone Kenalog 5 g liken around the third MPJ periarticular bilateral and we are sending for vascular studies and may try to make her some kind of inserts depending on response.  X-rays are negative for signs of fracture associated with this or advanced arthritis

## 2020-02-21 NOTE — Telephone Encounter (Signed)
Requested medications are due for refill today yes  Requested medications are on the active medication list yes  Last refill 11/30  Notes to clinic Unclear if the rx was to be continued. Has appt in January.

## 2020-02-24 ENCOUNTER — Other Ambulatory Visit: Payer: Self-pay

## 2020-02-24 ENCOUNTER — Telehealth (INDEPENDENT_AMBULATORY_CARE_PROVIDER_SITE_OTHER): Payer: Medicare HMO | Admitting: Registered Nurse

## 2020-02-24 DIAGNOSIS — F17210 Nicotine dependence, cigarettes, uncomplicated: Secondary | ICD-10-CM

## 2020-02-24 DIAGNOSIS — R059 Cough, unspecified: Secondary | ICD-10-CM

## 2020-02-24 DIAGNOSIS — R69 Illness, unspecified: Secondary | ICD-10-CM | POA: Diagnosis not present

## 2020-02-24 DIAGNOSIS — K2100 Gastro-esophageal reflux disease with esophagitis, without bleeding: Secondary | ICD-10-CM | POA: Diagnosis not present

## 2020-02-24 MED ORDER — ALBUTEROL SULFATE HFA 108 (90 BASE) MCG/ACT IN AERS: 2.0000 | INHALATION_SPRAY | Freq: Four times a day (QID) | RESPIRATORY_TRACT | 0 refills | Status: DC | PRN

## 2020-02-24 MED ORDER — SUCRALFATE 1 GM/10ML PO SUSP: 1.0000 g | Freq: Three times a day (TID) | ORAL | 0 refills | Status: DC

## 2020-02-24 MED ORDER — PANTOPRAZOLE SODIUM 40 MG PO TBEC: 40.0000 mg | DELAYED_RELEASE_TABLET | Freq: Every day | ORAL | 3 refills | Status: DC

## 2020-02-24 NOTE — Progress Notes (Signed)
Telemedicine Encounter- SOAP NOTE Established Patient  This telephone encounter was conducted with the patient's (or proxy's) verbal consent via audio telecommunications: yes  Patient was instructed to have this encounter in a suitably private space; and to only have persons present to whom they give permission to participate. In addition, patient identity was confirmed by use of name plus two identifiers (DOB and address).  I discussed the limitations, risks, security and privacy concerns of performing an evaluation and management service by telephone and the availability of in person appointments. I also discussed with the patient that there may be a patient responsible charge related to this service. The patient expressed understanding and agreed to proceed.  I spent a total of 22 minutes talking with the patient or their proxy.  Patient at home Provider in office   Chief Complaint  Patient presents with  . Cough    Patient states she is still coughing after two months. Per patient she has had an xray and that was fine and she has not even feel sick at all. And feels like she is coughing up some blood or something dark and starting to get scared.    Subjective   [name] is a 79 y.o. established patient. Telephone visit today for ongoing cough  HPI Onset around 2 mo ago Has been treated with abx and other antitussives without relief Usually whitish thick sputum, no green or yellow, no blood. occ black/dark flecks, but not consistent No new lightheadedness or dizziness, no sudden weight changes, no other symptoms concerning for lung ca. Did have recent cxr that was wnl  Notes that she has been treated for GERD in the past but has been off of treatment for the past 2-3 mos  Otherwise no concerns  Patient Active Problem List   Diagnosis Date Noted  . Non-melanoma skin cancer 09/06/2019  . Dysphonia 05/16/2019  . Pharyngeal dysphagia 05/16/2019  . NICM (nonischemic  cardiomyopathy) ([place]) 05/06/2019  . Carotid bruit present 05/06/2019  . Adjustment reaction with anxiety and depression 04/30/2017  . Insulin dependent type 2 diabetes mellitus ([place]) 04/30/2017  . Hypothyroidism 04/29/2017  . DJD (degenerative joint disease), lumbar 04/29/2017  . Obesity 02/15/2017  . History of laryngeal cancer 06/09/2016  . S/P coronary artery stent placement 05/30/2016  . Positive urine drug screen 05/30/2016  . Tachycardia 05/26/2015  . Syncope 05/26/2015  . ICD (implantable cardioverter-defibrillator), biventricular, in situ 05/26/2015  . Chronic kidney disease, stage III (moderate) ([place]) 04/10/2015  . Esophagitis, reflux 04/10/2015  . Vitamin D deficiency 04/10/2015  . Cough 03/10/2015  . Chronic pain   . Chronic systolic CHF (congestive heart failure) ([place])   . CAD S/P percutaneous coronary angioplasty   . Dyspnea on exertion   . Chest pain 05/01/2013  . Dyslipidemia, goal LDL below 70 12/26/2012  . Tobacco abuse 12/26/2012  . LBBB (left bundle branch block) 12/26/2012  . Type 2 diabetes mellitus with stage 3 chronic kidney disease, with long-term current use of insulin ([place]) 12/26/2012  . Essential hypertension 10/03/2008  . SLEEP APNEA 10/03/2008  . Chronic obstructive pulmonary disease ([place]) 10/03/2008    Past Medical History:  Diagnosis Date  . Anxiety   . Arthritis   . Chronic pain    a. Prior h/o chronic pain on methadone.  . Chronic systolic CHF (congestive heart failure) (HCC)    a. mixed ischemic/non-ischemic cardiomyopathy. b. s/p CRT-D in 06/2014.  Marland Kitchen CKD (chronic kidney disease), stage III ([place])   .  Complication of anesthesia    hard time waking her up from general surgery  . Coronary artery disease    a. remote RCA stenting in 2008 with non-DES. b. Cath 06/2014 following abnormal nuc: Stable, unchanged from prior cath, patent stent and 40% LM.  . Diabetes mellitus ([place])   . GERD (gastroesophageal reflux disease)   . Headache   .  Hyperlipidemia   . Hypertension   . LBBB (left bundle branch block)   . Nonischemic cardiomyopathy ([place])   . OSA (obstructive sleep apnea)    AHI-9.77/hr, during REM-50.32/hr  . Tobacco abuse     Current Outpatient Medications  Medication Sig Dispense Refill  . aspirin EC 81 MG tablet Take 81 mg by mouth at bedtime.    . benzonatate (TESSALON) 200 MG capsule Take 1 capsule (200 mg total) by mouth 2 (two) times daily as needed for cough. 20 capsule 0  . blood glucose meter kit and supplies KIT Dispense based on patient and insurance preference. Use three times a day as directed. Dx. E11.65, Z79.4 1 each 11  . carvedilol (COREG) 12.5 MG tablet TAKE 1 1/2 TABLET BY MOUTH TWICE A DAY 270 tablet 2  . Cholecalciferol (VITAMIN D3) 1.25 MG (50000 UT) TABS Take by mouth.    . clopidogrel (PLAVIX) 75 MG tablet Take 1 tablet (75 mg total) by mouth daily. 90 tablet 3  . Dextromethorphan-guaiFENesin (MUCINEX DM MAXIMUM STRENGTH) 60-1200 MG TB12 Take 1 tablet by mouth every 12 (twelve) hours. 30 tablet 1  . furosemide (LASIX) 40 MG tablet Take 2 tablets (80 mg total) by mouth every morning AND 1 tablet (40 mg total) every evening. 80 MG IN THE AM AND 40MG IN THE PM.. 180 tablet 1  . Garlic 287 MG CAPS Take by mouth.    . insulin aspart (NOVOLOG FLEXPEN) 100 UNIT/ML FlexPen Per insulin sliding scale, max TTD, 20 units. 15 mL 11  . insulin detemir (LEVEMIR FLEXTOUCH) 100 UNIT/ML FlexPen INJECT 16 UNITS SUBCUTANEOUSLY ONCE DAILY AT  10  PM 15 mL 0  . Insulin Pen Needle (BD PEN NEEDLE MICRO U/F) 32G X 6 MM MISC USE NEW NEEDLE FOR EACH INJECTION OF INSULIN, FOUR TIMES DAILY 100 each 5  . ipratropium (ATROVENT) 0.03 % nasal spray Place 2 sprays into both nostrils 2 (two) times daily. 30 mL 3  . levothyroxine (SYNTHROID) 88 MCG tablet Take 1 tablet (88 mcg total) by mouth at bedtime. 90 tablet 0  . mupirocin ointment (BACTROBAN) 2 % Apply 1 application topically 3 (three) times daily. 22 g 0  . nitroGLYCERIN  (NITROSTAT) 0.4 MG SL tablet Place 1 tablet (0.4 mg total) under the tongue every 5 (five) minutes as needed for chest pain. 25 tablet 3  . ONE TOUCH ULTRA TEST test strip   11  . ONETOUCH DELICA LANCETS 68T MISC   11  . oxyCODONE-acetaminophen (PERCOCET/ROXICET) 5-325 MG tablet Take by mouth every 4 (four) hours as needed for severe pain.    . potassium chloride SA (KLOR-CON) 20 MEQ tablet TAKE 1 TAB BY MOUTH 2 TIMES A DAY ON Monday, Wednesday AND Friday  (days you take Lasix) 180 tablet 0  . pyridOXINE (VITAMIN B-6) 100 MG tablet Take 100 mg by mouth daily.    Marland Kitchen rOPINIRole (REQUIP) 0.5 MG tablet Take 1 tablet (0.5 mg total) by mouth at bedtime. 30 tablet 3  . simvastatin (ZOCOR) 20 MG tablet TAKE 1 TABLET BY MOUTH ONCE DAILY WITH SUPPER 90 tablet 3  .  traZODone (DESYREL) 50 MG tablet Take 0.5-1 tablets (25-50 mg total) by mouth at bedtime. 90 tablet 1  . triamcinolone cream (KENALOG) 0.1 % APPLY ONE APPLICATION TOPICALLY TWO TIMES DAILY 30 g 0  . albuterol (VENTOLIN HFA) 108 (90 Base) MCG/ACT inhaler Inhale 2 puffs into the lungs every 6 (six) hours as needed for wheezing or shortness of breath. 8 g 0  . pantoprazole (PROTONIX) 40 MG tablet Take 1 tablet (40 mg total) by mouth daily. 30 tablet 3  . sucralfate (CARAFATE) 1 GM/10ML suspension Take 10 mLs (1 g total) by mouth 4 (four) times daily -  with meals and at bedtime. 420 mL 0   No current facility-administered medications for this visit.    Allergies  Allergen Reactions  . Valium [Diazepam] Swelling    face    Social History   Socioeconomic History  . Marital status: Widowed    Spouse name: Not on file  . Number of children: Not on file  . Years of education: Not on file  . Highest education level: Not on file  Occupational History  . Not on file  Tobacco Use  . Smoking status: Current Every Day Smoker    Packs/day: 2.00    Years: 58.00    Pack years: 116.00    Types: Cigarettes    Start date: 12/26/1951    Last  attempt to quit: 12/05/2013    Years since quitting: 6.2  . Smokeless tobacco: Never Used  . Tobacco comment: uses Vape cigarettes  Vaping Use  . Vaping Use: Never used  Substance and Sexual Activity  . Alcohol use: No    Alcohol/week: 0.0 standard drinks  . Drug use: No  . Sexual activity: Not on file  Other Topics Concern  . Not on file  Social History Narrative   Patient lives in [place] with his husband and grandson.   Son completed suicide on September 17th of this year, patient found him.   Social Determinants of Health   Financial Resource Strain: Not on file  Food Insecurity: Not on file  Transportation Needs: Not on file  Physical Activity: Not on file  Stress: Not on file  Social Connections: Not on file  Intimate Partner Violence: Not on file    Review of Systems  Constitutional: Negative.   HENT: Negative.   Eyes: Negative.   Respiratory: Positive for cough. Negative for hemoptysis, sputum production, shortness of breath and wheezing.   Cardiovascular: Negative.   Gastrointestinal: Negative.   Genitourinary: Negative.   Musculoskeletal: Negative.   Skin: Negative.   Neurological: Negative.   Endo/Heme/Allergies: Negative.   Psychiatric/Behavioral: Negative.   All other systems reviewed and are negative.   Objective   Vitals as reported by the patient: There were no vitals filed for this visit.  Porfiria was seen today for cough.  Diagnoses and all orders for this visit:  Cough -     CT CHEST LUNG CA SCREEN LOW DOSE W/O CM; Future -     albuterol (VENTOLIN HFA) 108 (90 Base) MCG/ACT inhaler; Inhale 2 puffs into the lungs every 6 (six) hours as needed for wheezing or shortness of breath. -     COVID-19, Flu A+B and RSV  Smokes less than 2 packs a day with greater than 40 pack year history -     CT CHEST LUNG CA SCREEN LOW DOSE W/O CM; Future  Gastroesophageal reflux disease with esophagitis without hemorrhage -     pantoprazole (PROTONIX) 40 MG  tablet; Take 1 tablet (40 mg total) by mouth daily. -     sucralfate (CARAFATE) 1 GM/10ML suspension; Take 10 mLs (1 g total) by mouth 4 (four) times daily -  with meals and at bedtime.   PLAN  Concern for GERD contributing  Will order chest ct based on smoking hx - 116 pack year hx  Pantoprazole and sucralfate  Ok to continue antitussives  Will send albuterol inhaler in case of reactive airway  Patient encouraged to call clinic with any questions, comments, or concerns.   I discussed the assessment and treatment plan with the patient. The patient was provided an opportunity to ask questions and all were answered. The patient agreed with the plan and demonstrated an understanding of the instructions.   The patient was advised to call back or seek an in-person evaluation if the symptoms worsen or if the condition fails to improve as anticipated.  I provided 22 minutes of non-face-to-face time during this encounter.  Maximiano Coss, NP  Primary Care at [facility]

## 2020-02-24 NOTE — Patient Instructions (Signed)
° ° ° °  If you have lab work done today you will be contacted with your lab results within the next 2 weeks.  If you have not heard from us then please contact us. The fastest way to get your results is to register for My Chart. ° ° °IF you received an x-ray today, you will receive an invoice from [facility]. Please contact [facility] at [phone] with questions or concerns regarding your invoice.  ° °IF you received labwork today, you will receive an invoice from [facility]. Please contact [facility] at [phone] with questions or concerns regarding your invoice.  ° °Our billing staff will not be able to assist you with questions regarding bills from these companies. ° °You will be contacted with the lab results as soon as they are available. The fastest way to get your results is to activate your My Chart account. Instructions are located on the last page of this paperwork. If you have not heard from us regarding the results in 2 weeks, please contact this office. °  ° ° ° °

## 2020-02-25 ENCOUNTER — Telehealth: Payer: Self-pay | Admitting: Podiatry

## 2020-02-25 NOTE — Telephone Encounter (Signed)
Please schedule her an appointment

## 2020-02-25 NOTE — Telephone Encounter (Signed)
Patient came in 02/20/20 and received injection with [facility]. Patient stated she is still experiencing pain and would like to come in for another injection before Christmas, Please advise

## 2020-02-26 LAB — COVID-19, FLU A+B AND RSV
Influenza A, NAA: NOT DETECTED
Influenza B, NAA: NOT DETECTED
RSV, NAA: NOT DETECTED
SARS-CoV-2, NAA: NOT DETECTED

## 2020-02-27 ENCOUNTER — Ambulatory Visit: Payer: Medicare HMO | Admitting: Podiatry

## 2020-02-27 ENCOUNTER — Other Ambulatory Visit: Payer: Self-pay

## 2020-02-27 ENCOUNTER — Encounter: Payer: Self-pay | Admitting: Podiatry

## 2020-02-27 DIAGNOSIS — M779 Enthesopathy, unspecified: Secondary | ICD-10-CM | POA: Diagnosis not present

## 2020-02-27 DIAGNOSIS — R0989 Other specified symptoms and signs involving the circulatory and respiratory systems: Secondary | ICD-10-CM

## 2020-02-27 MED ORDER — TRIAMCINOLONE ACETONIDE 10 MG/ML IJ SUSP
10.0000 mg | Freq: Once | INTRAMUSCULAR | Status: AC
  Administered 2020-02-27: 10 mg

## 2020-02-27 MED ORDER — NONFORMULARY OR COMPOUNDED ITEM: Status: DC

## 2020-02-27 NOTE — Progress Notes (Signed)
Subjective:   Patient ID: [name], female   DOB: 79 y.o.   MRN: [id number]   HPI Patient states the pain seems to have moved and I did get relief for a period of time   ROS      Objective:  Physical Exam  Neurovascular status intact with discomfort around the third MPJ that we have focused on it last visit doing better but the pain seems to be more migrated to the second MPJ with vascular studies indicating no issues with major circulatory status but unfortunate micro circulatory deficit     Assessment:  Inflammatory capsulitis with probable microvascular issues with blood flow to the digits     Plan:  H&P reviewed were not available to make her completely better but we will can continue to try to help her and I am get a focus on the second MPJ is carefully injected with 3 mg dexamethasone Kenalog 5 mg Xylocaine and then we will start her on compounding medicine and she will try to wear his cushion shoes as possible.  Reappoint as needed

## 2020-03-02 ENCOUNTER — Other Ambulatory Visit: Payer: Self-pay | Admitting: Podiatry

## 2020-03-02 DIAGNOSIS — M779 Enthesopathy, unspecified: Secondary | ICD-10-CM

## 2020-03-09 ENCOUNTER — Ambulatory Visit (INDEPENDENT_AMBULATORY_CARE_PROVIDER_SITE_OTHER): Payer: Medicare HMO

## 2020-03-09 DIAGNOSIS — Z95810 Presence of automatic (implantable) cardiac defibrillator: Secondary | ICD-10-CM

## 2020-03-09 DIAGNOSIS — I5022 Chronic systolic (congestive) heart failure: Secondary | ICD-10-CM | POA: Diagnosis not present

## 2020-03-12 ENCOUNTER — Other Ambulatory Visit: Payer: Self-pay | Admitting: Family Medicine

## 2020-03-12 DIAGNOSIS — R059 Cough, unspecified: Secondary | ICD-10-CM

## 2020-03-13 NOTE — Progress Notes (Signed)
EPIC Encounter for ICM Monitoring  Patient Name: [name] is a 80 y.o. female Date: 03/13/2020 Primary Care Physican: Maximiano Coss, NP Primary Cardiologist:Berry Electrophysiologist: Vergie Living Pacing: >99% 10/19/2021OfficeWeight:146lbs  Transmission reviewed.    Corvue thoracic impedancesuggesting normal fluid levels.  Prescribed:  Furosemide 40 mgontake 2 tablets (80 mg total) every morning and 1 tablet (40 mg total) every evening.  Potassium 20 mEqOnMonday, Wednesday AND Friday TAKE 1 TAB BY MOUTH 2 TIMES A DAYand on the days you take lasix.  Labs: 01/26/2021 Creatinine 1.13, BUN 17, Potassium 4.4, Sodium 139, GFR 47-54 10/11/2019 Creatinine 1.16, BUN 19, Potassium 4.6, Sodium 141, GFR 46-52 07/03/2019 Creatinine1.22, BUN16, Potassium4.1, [id number], [id number] 05/14/2019 Creatinine1.31, BUN19, Potassium5.4, [id number], [id number] 04/17/2019 Creatinine 1.22, BUN 16, Potassium 4.3, Sodium 143, GFR 43-49 A complete set of results can be found in Results Review.  Recommendations:No changes.  Follow-up plan: ICM clinic phone appointment on2/10/2020. 91 day device clinic remote transmission1/02/2021.   EP/Cardiology Office Visits:Recall for8/26/2022with Dr Caryl Comes.Recall 3/1/2022with Dr. Gwenlyn Found.   Copy of ICM check sent to [place]   3 month ICM trend: 03/10/2020.    1 Year ICM trend:       [name], RN 03/13/2020 11:04 AM

## 2020-03-16 ENCOUNTER — Other Ambulatory Visit: Payer: Self-pay | Admitting: Neurological Surgery

## 2020-03-18 ENCOUNTER — Ambulatory Visit (INDEPENDENT_AMBULATORY_CARE_PROVIDER_SITE_OTHER): Payer: Medicare HMO

## 2020-03-18 ENCOUNTER — Ambulatory Visit (HOSPITAL_COMMUNITY): Payer: Medicare HMO

## 2020-03-18 ENCOUNTER — Inpatient Hospital Stay (HOSPITAL_COMMUNITY): Admission: RE | Admit: 2020-03-18 | Payer: Medicare HMO | Source: Ambulatory Visit

## 2020-03-18 DIAGNOSIS — I428 Other cardiomyopathies: Secondary | ICD-10-CM | POA: Diagnosis not present

## 2020-03-20 ENCOUNTER — Encounter: Payer: Self-pay | Admitting: Registered Nurse

## 2020-03-20 ENCOUNTER — Other Ambulatory Visit: Payer: Self-pay

## 2020-03-20 ENCOUNTER — Ambulatory Visit (INDEPENDENT_AMBULATORY_CARE_PROVIDER_SITE_OTHER): Payer: Medicare HMO | Admitting: Registered Nurse

## 2020-03-20 VITALS — BP 104/58 | HR 71 | Temp 97.9°F | Ht 66.0 in | Wt 146.0 lb

## 2020-03-20 DIAGNOSIS — E1149 Type 2 diabetes mellitus with other diabetic neurological complication: Secondary | ICD-10-CM

## 2020-03-20 DIAGNOSIS — E1142 Type 2 diabetes mellitus with diabetic polyneuropathy: Secondary | ICD-10-CM | POA: Diagnosis not present

## 2020-03-20 DIAGNOSIS — M1990 Unspecified osteoarthritis, unspecified site: Secondary | ICD-10-CM | POA: Diagnosis not present

## 2020-03-20 LAB — CUP PACEART REMOTE DEVICE CHECK
Battery Remaining Longevity: 26 mo
Battery Remaining Percentage: 28 %
Battery Voltage: 2.84 V
Brady Statistic AP VP Percent: 12 %
Brady Statistic AP VS Percent: 1 %
Brady Statistic AS VP Percent: 88 %
Brady Statistic AS VS Percent: 1 %
Brady Statistic RA Percent Paced: 12 %
Date Time Interrogation Session: 20220112020017
HighPow Impedance: 69 Ohm
HighPow Impedance: 69 Ohm
Implantable Lead Implant Date: 20160406
Implantable Lead Implant Date: 20160406
Implantable Lead Implant Date: 20160406
Implantable Lead Location: 753858
Implantable Lead Location: 753859
Implantable Lead Location: 753860
Implantable Lead Model: 7122
Implantable Pulse Generator Implant Date: 20160406
Lead Channel Impedance Value: 1175 Ohm
Lead Channel Impedance Value: 350 Ohm
Lead Channel Impedance Value: 560 Ohm
Lead Channel Pacing Threshold Amplitude: 0.625 V
Lead Channel Pacing Threshold Amplitude: 0.75 V
Lead Channel Pacing Threshold Amplitude: 0.875 V
Lead Channel Pacing Threshold Pulse Width: 0.5 ms
Lead Channel Pacing Threshold Pulse Width: 0.5 ms
Lead Channel Pacing Threshold Pulse Width: 0.5 ms
Lead Channel Sensing Intrinsic Amplitude: 12 mV
Lead Channel Sensing Intrinsic Amplitude: 4.8 mV
Lead Channel Setting Pacing Amplitude: 1.75 V
Lead Channel Setting Pacing Amplitude: 2 V
Lead Channel Setting Pacing Amplitude: 2 V
Lead Channel Setting Pacing Pulse Width: 0.5 ms
Lead Channel Setting Pacing Pulse Width: 0.5 ms
Lead Channel Setting Sensing Sensitivity: 0.5 mV
Pulse Gen Serial Number: 7199559

## 2020-03-20 NOTE — Patient Instructions (Signed)
° ° ° °  If you have lab work done today you will be contacted with your lab results within the next 2 weeks.  If you have not heard from us then please contact us. The fastest way to get your results is to register for My Chart. ° ° °IF you received an x-ray today, you will receive an invoice from [facility]. Please contact [facility] at [phone] with questions or concerns regarding your invoice.  ° °IF you received labwork today, you will receive an invoice from [facility]. Please contact [facility] at [phone] with questions or concerns regarding your invoice.  ° °Our billing staff will not be able to assist you with questions regarding bills from these companies. ° °You will be contacted with the lab results as soon as they are available. The fastest way to get your results is to activate your My Chart account. Instructions are located on the last page of this paperwork. If you have not heard from us regarding the results in 2 weeks, please contact this office. °  ° ° ° °

## 2020-03-23 ENCOUNTER — Encounter (HOSPITAL_COMMUNITY): Payer: Self-pay

## 2020-03-23 ENCOUNTER — Ambulatory Visit (HOSPITAL_COMMUNITY): Payer: Medicare HMO

## 2020-03-23 ENCOUNTER — Ambulatory Visit (HOSPITAL_COMMUNITY): Admission: RE | Admit: 2020-03-23 | Payer: Medicare HMO | Source: Ambulatory Visit

## 2020-04-01 ENCOUNTER — Ambulatory Visit (HOSPITAL_COMMUNITY): Admission: RE | Admit: 2020-04-01 | Payer: Medicare HMO | Source: Ambulatory Visit

## 2020-04-01 ENCOUNTER — Other Ambulatory Visit (HOSPITAL_COMMUNITY): Payer: Medicare HMO

## 2020-04-01 NOTE — Progress Notes (Signed)
Remote ICD transmission.   

## 2020-04-07 NOTE — Progress Notes (Signed)
Cardiology Clinic Note   Patient Name: [name] Date of Encounter: 04/09/2020  Primary Care Provider:  Maximiano Coss, NP Primary Cardiologist:  Quay Burow, MD  Patient Profile    [name]. [name] 80 year old female presents today for follow-up evaluation of her hypertension.  Past Medical History    Past Medical History:  Diagnosis Date  . Anxiety   . Arthritis   . Chronic pain    a. Prior h/o chronic pain on methadone.  . Chronic systolic CHF (congestive heart failure) (HCC)    a. mixed ischemic/non-ischemic cardiomyopathy. b. s/p CRT-D in 06/2014.  Marland Kitchen CKD (chronic kidney disease), stage III ([place])   . Complication of anesthesia    hard time waking her up from general surgery  . Coronary artery disease    a. remote RCA stenting in 2008 with non-DES. b. Cath 06/2014 following abnormal nuc: Stable, unchanged from prior cath, patent stent and 40% LM.  . Diabetes mellitus ([place])   . GERD (gastroesophageal reflux disease)   . Headache   . Hyperlipidemia   . Hypertension   . LBBB (left bundle branch block)   . Nonischemic cardiomyopathy ([place])   . OSA (obstructive sleep apnea)    AHI-9.77/hr, during REM-50.32/hr  . Tobacco abuse    Past Surgical History:  Procedure Laterality Date  . BACK SURGERY  2013  . BI-VENTRICULAR IMPLANTABLE CARDIOVERTER DEFIBRILLATOR N/A 06/11/2014   STJ CRTD implanted by Dr Caryl Comes  . CARDIAC CATHETERIZATION  12/14/2006   RCA stented with a 3.0 [employer] Liberte stent resulting in a reduction of 75% to 0% residual  . CHOLECYSTECTOMY     20 years ago  . COLONOSCOPY WITH PROPOFOL N/A 12/15/2014   Procedure: COLONOSCOPY WITH PROPOFOL;  Surgeon: Clarene Essex, MD;  Location: [facility];  Service: Endoscopy;  Laterality: N/A;  . EYE SURGERY     bilateral cataract surgery   . LEFT AND RIGHT HEART CATHETERIZATION WITH CORONARY ANGIOGRAM N/A 05/29/2014   Procedure: LEFT AND RIGHT HEART CATHETERIZATION WITH CORONARY ANGIOGRAM;  Surgeon: Lorretta Harp, MD;  Location: [facility] CATH LAB;  Service: Cardiovascular;  Laterality: N/A;  . LEFT HEART CATHETERIZATION WITH CORONARY ANGIOGRAM N/A 12/27/2012   Procedure: LEFT HEART CATHETERIZATION WITH CORONARY ANGIOGRAM;  Surgeon: Lorretta Harp, MD;  Location: [facility] CATH LAB;  Service: Cardiovascular;  Laterality: N/A;    Allergies  Allergies  Allergen Reactions  . Valium [Diazepam] Swelling    face    History of Present Illness    [name] has a past medical history of essential hypertension, dyslipidemia, tobacco abuse, coronary artery disease status post RCA stenting.  ICD-biventricular ICD implanted by Dr. Caryl Comes 06/11/2014, and carotid stenosis.  She underwent cardiac catheterization 2/09 after having an abnormal nuclear stress test showing 30-40% eccentric distal left main.  A proximal RCA stent was patent and her EF was normal.  She has chronic left bundle branch block.  She is a 1-1/2 pack/day smoker.  Her nuclear stress test 03/24/2010 showed new anterior apical scar.  She underwent cardiac catheterization by Dr. Gwenlyn Found 03/31/2010 that showed 50% in-stent restenosis with her RCA stent, 40% distal left main and normal LV function.  She again underwent cardiac catheterization 12/26/2012 that showed unchanged anatomy.  Her RCA stent was patent in her left main showed mild disease.  She did have mild to moderate left ventricular dysfunction with an EF of 45%.  When she was seen in follow-up in November 14 she was noted to have occasional chest  pain which had not changed in frequency or severity.  Her lower extremity arterial Dopplers on 03/14/2014 showed ABIs of 1 bilaterally high frequency signal in the left external iliac artery although her symptoms are symmetric.  She underwent echocardiogram 05/20/2014 and nuclear stress test which showed an EF of 20%, dilated left ventricular and moderate MR.  She again underwent cardiac catheterizations 05/29/2014 which showed unchanged coronary anatomy with severe LV  dysfunction and elevated filling pressures.  She underwent BiV ICD implantation by Dr. Caryl Comes on 4/16.  Her follow-up echocardiogram 10/22/2015 showed an EF of 50-55%. She was seen by Dr. Gwenlyn Found in follow-up and remained clinically stable.  She was seen in follow-up by Dr. Caryl Comes on 09/17/2018 for evaluation of her BiV ICD and was doing well at that time as well.  Her husband passed away Jul 07, 2018.  They have been married 35 years.  She was seen by Kerin Ransom, PA-C on 05/06/2019 and was found to be hypotensive.  Her furosemide was reduced at that time.  She denied heart failure type symptoms.  She continued to smoke a pack and a half a day.  Her carotid Dopplers from 05/06/2019 showed significant left common carotid artery stenosis.  She was seen in the clinic 07/22/2019 for follow-up evaluation and stated she felt well.  She contacted her PCP due to steady weight gain after reducing furosemide.  She was  back on  80 mg furosemide morning and 40 mg evening dosing.  She had follow-up labs done which showed no increase and creatinine and stable potassium.  She stated that she still had some grief from losing her husband and her son.  However, she was now planting flowers again and enjoying being outdoors.  She was occasionally outside planting flowers for 10 hours at a time.  She had a very large 24 acre property.  She did use sodium sparingly on her food and indicated that she has switched to "no salt" seasoning.  She stated that she had tried to stop smoking several times but had been unable to quit.  I  gave her the mindfulness stress reduction sheet, the salty 6 dietary she, continued Lasix, and planned her follow-up with Dr. Gwenlyn Found in 6 months.  She presents the clinic today for follow-up evaluation states she feels well today.  She continues to have intermittent episodes of sharp chest pain.  She reports this pain lasts for seconds and dissipates on its own without intervention.  She has previously sent  transmissions to EP for evaluation.  She reports that they did not show anything with the tracings sent through her transmissions.  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  We reviewed her prior chest CT and carotid stenosis.  I will give her the heart healthy diet sheet, have her continue her physical activity as tolerated, order a follow-up noncontrast chest CT, carotid Dopplers, and have her follow-up in 6 months.  Today she denies chest pain, shortness of breath, lower extremity edema, fatigue, palpitations, melena, hematuria, hemoptysis, diaphoresis, weakness, presyncope, syncope, orthopnea, and PND.  Home Medications    Prior to Admission medications   Medication Sig Start Date End Date Taking? Authorizing Provider  albuterol (VENTOLIN HFA) 108 (90 Base) MCG/ACT inhaler Inhale 2 puffs into the lungs every 6 (six) hours as needed for wheezing or shortness of breath. Patient not taking: No sig reported 02/24/20   Maximiano Coss, NP  ascorbic acid (VITAMIN C) 500 MG tablet Take  500 mg by mouth daily.    [provider]  aspirin EC 81 MG tablet Take 81 mg by mouth at bedtime.    [provider]  benzonatate (TESSALON) 200 MG capsule TAKE 1 CAPSULE BY MOUTH TWICE DAILY AS NEEDED FOR COUGH Patient not taking: No sig reported 02/24/20   Just, Laurita Quint, FNP  blood glucose meter kit and supplies KIT Dispense based on patient and insurance preference. Use three times a day as directed. Dx. E11.65, Z79.4 10/11/19   Jacelyn Pi, Lilia Argue, MD  carvedilol (COREG) 12.5 MG tablet TAKE 1 1/2 TABLET BY MOUTH TWICE A DAY Patient taking differently: Take 18.75 mg by mouth 2 (two) times daily. 01/27/20   Maximiano Coss, NP  Cholecalciferol (DIALYVITE VITAMIN D 5000) 125 MCG (5000 UT) capsule Take 5,000 Units by mouth daily.    [provider]  clopidogrel (PLAVIX) 75 MG tablet Take 1 tablet (75 mg total) by mouth daily. Patient  taking differently: Take 75 mg by mouth at bedtime. 01/27/20   Maximiano Coss, NP  Dextromethorphan-guaiFENesin (MUCINEX DM MAXIMUM STRENGTH) 60-1200 MG TB12 Take 1 tablet by mouth every 12 (twelve) hours. 02/04/20   Just, Laurita Quint, FNP  diclofenac Sodium (VOLTAREN) 1 % GEL Apply 1 application topically 4 (four) times daily as needed (pain).    [provider]  furosemide (LASIX) 40 MG tablet Take 2 tablets (80 mg total) by mouth every morning AND 1 tablet (40 mg total) every evening. 80 MG IN THE AM AND 40MG IN THE PM.. Patient taking differently: Take 2 tablets (80 mg total) by mouth every morning AND 1 tablet (40 mg total) every evening. 01/27/20   Maximiano Coss, NP  Garlic 175 MG CAPS Take 500 mg by mouth daily.    [provider]  ibuprofen (ADVIL) 200 MG tablet Take 200 mg by mouth at bedtime.    [provider]  insulin aspart (NOVOLOG FLEXPEN) 100 UNIT/ML FlexPen Per insulin sliding scale, max TTD, 20 units. Patient taking differently: Inject 2-3 Units into the skin daily as needed (blood sugar over 180). 06/26/17   Jacelyn Pi, Irma M, MD  insulin detemir (LEVEMIR FLEXTOUCH) 100 UNIT/ML FlexPen INJECT 16 UNITS SUBCUTANEOUSLY ONCE DAILY AT  10  PM Patient taking differently: Inject 16 Units into the skin daily before supper. 01/27/20   Maximiano Coss, NP  Insulin Pen Needle (BD PEN NEEDLE MICRO U/F) 32G X 6 MM MISC USE NEW NEEDLE FOR EACH INJECTION OF INSULIN, FOUR TIMES DAILY 04/30/19   Jacelyn Pi, Lilia Argue, MD  ipratropium (ATROVENT) 0.03 % nasal spray Place 2 sprays into both nostrils 2 (two) times daily. Patient taking differently: Place 2-3 sprays into both nostrils daily. 05/23/19   Daleen Squibb, MD  levothyroxine (SYNTHROID) 88 MCG tablet Take 1 tablet (88 mcg total) by mouth at bedtime. 01/27/20   Maximiano Coss, NP  mupirocin ointment (BACTROBAN) 2 % Apply 1 application topically 3 (three) times daily. 07/29/19   Jacelyn Pi, Lilia Argue, MD   nitroGLYCERIN (NITROSTAT) 0.4 MG SL tablet Place 1 tablet (0.4 mg total) under the tongue every 5 (five) minutes as needed for chest pain. 05/06/19   Erlene Quan, PA-C  NONFORMULARY OR COMPOUNDED ITEM Pain cream : ketamine 5%, baclofen 2%, gabapentin 5%, lidocaine 5%, menthol 1% Order faxed to [facility] Patient not taking: No sig reported 02/27/20   Wallene Huh, DPM  ONE TOUCH ULTRA TEST test strip  06/07/17   [provider]  ONETOUCH DELICA LANCETS 09G MISC  06/07/17   [provider]  oxyCODONE-acetaminophen (PERCOCET/ROXICET) 5-325 MG tablet Take 1 tablet by mouth 2 (two) times daily as needed for severe pain.    [provider]  pantoprazole (PROTONIX) 40 MG tablet Take 1 tablet (40 mg total) by mouth daily. Patient taking differently: Take 40 mg by mouth at bedtime. 02/24/20   Maximiano Coss, NP  potassium chloride SA (KLOR-CON) 20 MEQ tablet TAKE 1 TAB BY MOUTH 2 TIMES A DAY ON Monday, Wednesday AND Friday  (days you take Lasix) Patient taking differently: Take 20 mEq by mouth See admin instructions. Take 20 meq twice daily on Monday, Wednesday, and Friday  (days you take Lasix) 02/20/20   Erlene Quan, PA-C  pyridOXINE (VITAMIN B-6) 100 MG tablet Take 100 mg by mouth at bedtime.    [provider]  rOPINIRole (REQUIP) 0.5 MG tablet Take 1 tablet (0.5 mg total) by mouth at bedtime. 01/27/20   Maximiano Coss, NP  simvastatin (ZOCOR) 20 MG tablet TAKE 1 TABLET BY MOUTH ONCE DAILY WITH SUPPER Patient taking differently: Take 20 mg by mouth every evening. 01/27/20   Maximiano Coss, NP  sucralfate (CARAFATE) 1 GM/10ML suspension Take 10 mLs (1 g total) by mouth 4 (four) times daily -  with meals and at bedtime. Patient not taking: Reported on 03/20/2020 02/24/20   Maximiano Coss, NP  traZODone (DESYREL) 50 MG tablet Take 0.5-1 tablets (25-50 mg total) by mouth at bedtime. Patient taking differently: Take 50 mg by mouth at bedtime. 01/27/20    Maximiano Coss, NP  triamcinolone cream (KENALOG) 0.1 % APPLY ONE APPLICATION TOPICALLY TWO TIMES DAILY Patient taking differently: Apply 1 application topically 2 (two) times daily. 11/28/18   Daleen Squibb, MD    Family History    Family History  Problem Relation Age of Onset  . Stroke Mother   . Hypertension Mother   . Coronary artery disease Father   . Stroke Brother   . Heart disease Brother   . Other Brother        H1N1 VIRUS  . Healthy Sister   . Healthy Sister    She indicated that her mother is deceased. She indicated that her father is deceased. She indicated that two of her five sisters are alive. She indicated that only one of her six brothers is alive. She indicated that her maternal grandmother is deceased. She indicated that her maternal grandfather is deceased. She indicated that her paternal grandmother is deceased. She indicated that her paternal grandfather is deceased. She indicated that her son is deceased. She indicated that all of her four children are alive.  Social History    Social History   Socioeconomic History  . Marital status: Widowed    Spouse name: Not on file  . Number of children: Not on file  . Years of education: Not on file  . Highest education level: Not on file  Occupational History  . Not on file  Tobacco Use  . Smoking status: Current Every Day Smoker    Packs/day: 2.00    Years: 58.00    Pack years: 116.00    Types: Cigarettes    Start date: 12/26/1952    Last attempt to quit: 12/05/2013    Years since quitting: 6.3  . Smokeless tobacco: Never Used  . Tobacco comment: uses Vape cigarettes  Vaping Use  . Vaping Use: Never used  Substance and Sexual Activity  . Alcohol use: No  Alcohol/week: 0.0 standard drinks  . Drug use: No  . Sexual activity: Not on file  Other Topics Concern  . Not on file  Social History Narrative   Patient lives in [place] with his husband and grandson.   Son completed suicide on  September 17th of this year, patient found him.   Social Determinants of Health   Financial Resource Strain: Not on file  Food Insecurity: Not on file  Transportation Needs: Not on file  Physical Activity: Not on file  Stress: Not on file  Social Connections: Not on file  Intimate Partner Violence: Not on file     Review of Systems    General:  No chills, fever, night sweats or weight changes.  Cardiovascular:  No chest pain, dyspnea on exertion, edema, orthopnea, palpitations, paroxysmal nocturnal dyspnea. Dermatological: No rash, lesions/masses Respiratory: No cough, dyspnea Urologic: No hematuria, dysuria Abdominal:   No nausea, vomiting, diarrhea, bright red blood per rectum, melena, or hematemesis Neurologic:  No visual changes, wkns, changes in mental status. All other systems reviewed and are otherwise negative except as noted above.  Physical Exam    VS:  BP (!) 112/58 (BP Location: Left Arm, Patient Position: Sitting)   Pulse 71   Ht 5' 6" (1.676 m)   Wt 146 lb 3.2 oz (66.3 kg)   SpO2 97%   BMI 23.60 kg/m  , BMI Body mass index is 23.6 kg/m. GEN: Well nourished, well developed, in no acute distress. HEENT: normal. Neck: Supple, no JVD, carotid bruits, or masses. Cardiac: RRR, no murmurs, rubs, or gallops. No clubbing, cyanosis, edema.  Radials/DP/PT 2+ and equal bilaterally.  Respiratory:  Respirations regular and unlabored, clear to auscultation bilaterally. GI: Soft, nontender, nondistended, BS + x 4. MS: no deformity or atrophy. Skin: warm and dry, no rash. Neuro:  Strength and sensation are intact. Psych: Normal affect.  Accessory Clinical Findings    Recent Labs: 05/14/2019: Hemoglobin 15.5; Platelets 235 10/11/2019: ALT 11; Magnesium 2.3 01/27/2020: BUN 17; Creatinine, Ser 1.13; Potassium 4.4; Sodium 139; TSH 3.170   Recent Lipid Panel    Component Value Date/Time   CHOL 150 10/11/2019 1459   TRIG 107 10/11/2019 1459   HDL 53 10/11/2019 1459    CHOLHDL 2.8 10/11/2019 1459   CHOLHDL 2.9 08/21/2009 0100   VLDL 25 08/21/2009 0100   LDLCALC 78 10/11/2019 1459    ECG personally reviewed by me today-EKG today shows normal sinus rhythm nonspecific intraventricular block, right ventricular hypertrophy, anterior infarct undetermined age anterior lateral infarct undetermined age 19 bpm- No acute changes  Echocardiogram 10/22/2015 Study Conclusions   - Left ventricle: Systolic function was normal. The estimated  ejection fraction was in the range of 50% to 55%. Doppler  parameters are consistent with abnormal left ventricular  relaxation (grade 1 diastolic dysfunction).  - Impressions: AV optimazation with no clear change in mitral  inflow as they all showed abnormal relaxation with prominant  atrial contribution to filling  and no CO calculated at varying AV intervals   Impressions:   - AV optimazation with no clear change in mitral inflow as they all  showed abnormal relaxation with prominant atrial contribution to  filling  and no CO calculated at varying AV intervals  ABI 02/20/2020  Summary:   Right: Resting right ankle-brachial index is within normal range. No  evidence of significant right lower extremity arterial disease. The right  toe-brachial index is abnormal.   Left: Resting left ankle-brachial index is within normal range. No  evidence of significant left lower extremity arterial disease. The left  toe-brachial index is abnormal.   CT angio neck 05/30/2019 IMPRESSION: Noncalcified plaque along the mid to distal left common carotid artery causing up to 75% stenosis. No measurable stenosis of the proximal internal carotid arteries.  Patent vertebral arteries.  Left upper lobe pulmonary nodule measuring 8 x 5 mm is new since 2015. Non-contrast chest CT at 6-12 months is recommended. If the nodule is stable at time of repeat CT, then future CT at 18-24 months (from today's scan) is  considered optional for low-risk patients, but is recommended for high-risk patients. This recommendation follows the consensus statement: Guidelines for Management of Incidental Pulmonary Nodules Detected on CT Images: From the Fleischner Society 2017; Radiology 2017; 284:228-243.  Assessment & Plan   1.  Essential hypertension-BP today 112/58.  Controlled at home. Continue carvedilol Heart healthy low-sodium diet Increase physical activity as tolerated  Carotid stenosis-recent carotid Doppler showed significant left carotid artery stenosis.  CTA showed noncalcified plaque along the mid to distal common carotid causing up to 75% stenosis.  Mild calcified plaque at the ICA without measurable stenosis.  No evidence of flow limitation was noted.  There was also a left upper lobe pulmonary nodule measuring 8 x 5 mm that was new from her 2015 scan.    CT angio neck showed mid to distal left common carotid artery noncalcified plaque 75% stenosis.  Noncontrast chest CT recommended for left upper lobe nodule measuring 8 x 5 mm Repeat carotid Dopplers 3/89/3734  Chronic systolic CHF-continues to be euvolemic .  Weight stable.  Continue furosemide, carvedilol Heart healthy low-sodium diet Daily weights  Dyslipidemia (goal below 70)-LDL 78 on 10/11/2019 Continue simvastatin Heart healthy low-sodium high-fiber diet Increase physical activity as tolerated  Coronary artery disease-occasional episodes of sharp chest pain.  EKG today shows normal sinus rhythm, nonspecific interventricular block, right ventricular hypertrophy, inferior infarct undetermined age, anterior lateral infarct undetermined age 2 bpm. Cardiac catheterization 05/29/2014 showed unchanged coronary anatomy and severe LV dysfunction. Continue current medical therapy Heart healthy low-sodium diet Increase physical activity as tolerated  ICD-BiV ICD implanted by Dr. Caryl Comes on 06/11/2014.  Follow-up echocardiogram showed an improvement  in the EF to 50-55% Followed by EP  Lung nodule-8 x 5 mm left upper lobe lung nodule seen on chest CT. Repeat noncontrast chest CT Follow-up with PCP  Disposition: Follow-up with Dr. Gwenlyn Found or me in 6 months.   Jossie Ng. Cleaver NP-C    04/09/2020, 8:40 AM [address] Office [phone] Fax [phone]  Notice: This dictation was prepared with Dragon dictation along with smaller phrase technology. Any transcriptional errors that result from this process are unintentional and may not be corrected upon review.  I spent 15 minutes examining this patient, reviewing medications, and using patient centered shared decision making involving her cardiac care.  Prior to her visit I spent greater than 20 minutes reviewing her past medical history,  medications, and prior cardiac tests.

## 2020-04-09 ENCOUNTER — Ambulatory Visit: Payer: Medicare HMO | Admitting: General Practice

## 2020-04-09 ENCOUNTER — Other Ambulatory Visit: Payer: Self-pay

## 2020-04-09 ENCOUNTER — Encounter: Payer: Self-pay | Admitting: General Practice

## 2020-04-09 VITALS — BP 112/58 | HR 71 | Ht 66.0 in | Wt 146.2 lb

## 2020-04-09 DIAGNOSIS — Z95810 Presence of automatic (implantable) cardiac defibrillator: Secondary | ICD-10-CM

## 2020-04-09 DIAGNOSIS — I5022 Chronic systolic (congestive) heart failure: Secondary | ICD-10-CM

## 2020-04-09 DIAGNOSIS — I2510 Atherosclerotic heart disease of native coronary artery without angina pectoris: Secondary | ICD-10-CM | POA: Diagnosis not present

## 2020-04-09 DIAGNOSIS — I10 Essential (primary) hypertension: Secondary | ICD-10-CM

## 2020-04-09 DIAGNOSIS — Z79899 Other long term (current) drug therapy: Secondary | ICD-10-CM

## 2020-04-09 DIAGNOSIS — R911 Solitary pulmonary nodule: Secondary | ICD-10-CM | POA: Diagnosis not present

## 2020-04-09 DIAGNOSIS — E785 Hyperlipidemia, unspecified: Secondary | ICD-10-CM | POA: Diagnosis not present

## 2020-04-09 DIAGNOSIS — R0989 Other specified symptoms and signs involving the circulatory and respiratory systems: Secondary | ICD-10-CM | POA: Diagnosis not present

## 2020-04-09 NOTE — Patient Instructions (Addendum)
Medication Instructions:  The current medical regimen is effective;  continue present plan and medications as directed. Please refer to the Current Medication list given to you today.  *If you need a refill on your cardiac medications before your next appointment, please call your pharmacy*  Lab Work: BMET 3-4 DAYS BEFORE CT If you have labs (blood work) drawn today and your tests are completely normal, you will receive your results only by:  [place] (if you have MyChart) OR A paper copy in the mail.  If you have any lab test that is abnormal or we need to change your treatment, we will call you to review the results. You may go to any [facility] that is convenient for you however, we do have a lab in our office that is able to assist you. You DO NOT need an appointment for our lab. The lab is open 8:00am and closes at 4:00pm. Lunch 12:45 - 1:45pm.  Testing/Procedures: Your physician has requested that you have a carotid duplex. This test is an ultrasound of the carotid arteries in your neck. It looks at blood flow through these arteries that supply the brain with blood. Allow one hour for this exam. There are no restrictions or special instructions.  CAT scanning, is a noninvasive, special x-ray that produces cross-sectional images of the body using x-rays and a computer. CT scans help physicians diagnose and treat medical conditions. For some CT exams, a contrast material is used to enhance visibility in the area of the body being studied. CT scans provide greater clarity and reveal more details than regular x-ray exams.   Special Instructions PLEASE MAINTAIN PHYSICAL ACTIVITY AS TOLERATED  PLEASE READ AND FOLLOW HEART HEALTHY DIET-ATTACHED  Follow-Up: Your next appointment:  6 month(s) In Person with Quay Burow, MD OR IF UNAVAILABLE JESSE CLEAVER, FNP-C   Please call our office 2 months in advance to schedule this appointment   At [facility], you and your health needs are our  priority.  As part of our continuing mission to provide you with exceptional heart care, we have created designated Provider Care Teams.  These Care Teams include your primary Cardiologist (physician) and Advanced Practice Providers (APPs -  Physician Assistants and Nurse Practitioners) who all work together to provide you with the care you need, when you need it.  We recommend signing up for the patient portal called "MyChart".  Sign up information is provided on this After Visit Summary.  MyChart is used to connect with patients for Virtual Visits (Telemedicine).  Patients are able to view lab/test results, encounter notes, upcoming appointments, etc.  Non-urgent messages can be sent to your provider as well.   To learn more about what you can do with MyChart, go to [email or web address].      Heart-Healthy Eating Plan Heart-healthy meal planning includes:  Eating less unhealthy fats.  Eating more healthy fats.  Making other changes in your diet. Talk with your doctor or a diet specialist (dietitian) to create an eating plan that is right for you. What are tips for following this plan? Cooking Avoid frying your food. Try to bake, boil, grill, or broil it instead. You can also reduce fat by:  Removing the skin from poultry.  Removing all visible fats from meats.  Steaming vegetables in water or broth. Meal planning  At meals, divide your plate into four equal parts: ? Fill one-half of your plate with vegetables and green salads. ? Fill one-fourth of your plate with whole grains. ?  Fill one-fourth of your plate with lean protein foods.  Eat 4-5 servings of vegetables per day. A serving of vegetables is: ? 1 cup of raw or cooked vegetables. ? 2 cups of raw leafy greens.  Eat 4-5 servings of fruit per day. A serving of fruit is: ? 1 medium whole fruit. ?  cup of dried fruit. ?  cup of fresh, frozen, or canned fruit. ?  cup of 100% fruit juice.  Eat more foods that have  soluble fiber. These are apples, broccoli, carrots, beans, peas, and barley. Try to get 20-30 g of fiber per day.  Eat 4-5 servings of nuts, legumes, and seeds per week: ? 1 serving of dried beans or legumes equals  cup after being cooked. ? 1 serving of nuts is  cup. ? 1 serving of seeds equals 1 tablespoon.   General information  Eat more home-cooked food. Eat less restaurant, buffet, and fast food.  Limit or avoid alcohol.  Limit foods that are high in starch and sugar.  Avoid fried foods.  Lose weight if you are overweight.  Keep track of how much salt (sodium) you eat. This is important if you have high blood pressure. Ask your doctor to tell you more about this.  Try to add vegetarian meals each week. Fats  Choose healthy fats. These include olive oil and canola oil, flaxseeds, walnuts, almonds, and seeds.  Eat more omega-3 fats. These include salmon, mackerel, sardines, tuna, flaxseed oil, and ground flaxseeds. Try to eat fish at least 2 times each week.  Check food labels. Avoid foods with trans fats or high amounts of saturated fat.  Limit saturated fats. ? These are often found in animal products, such as meats, butter, and cream. ? These are also found in plant foods, such as palm oil, palm kernel oil, and coconut oil.  Avoid foods with partially hydrogenated oils in them. These have trans fats. Examples are stick margarine, some tub margarines, cookies, crackers, and other baked goods. What foods can I eat? Fruits All fresh, canned (in natural juice), or frozen fruits. Vegetables Fresh or frozen vegetables (raw, steamed, roasted, or grilled). Green salads. Grains Most grains. Choose whole wheat and whole grains most of the time. Rice and pasta, including brown rice and pastas made with whole wheat. Meats and other proteins Lean, well-trimmed beef, veal, pork, and lamb. Chicken and [place] without skin. All fish and shellfish. Wild duck, rabbit, pheasant, and  venison. Egg whites or low-cholesterol egg substitutes. Dried beans, peas, lentils, and tofu. Seeds and most nuts. Dairy Low-fat or nonfat cheeses, including ricotta and mozzarella. Skim or 1% milk that is liquid, powdered, or evaporated. Buttermilk that is made with low-fat milk. Nonfat or low-fat yogurt. Fats and oils Non-hydrogenated (trans-free) margarines. Vegetable oils, including soybean, sesame, sunflower, olive, peanut, safflower, corn, canola, and cottonseed. Salad dressings or mayonnaise made with a vegetable oil. Beverages Mineral water. Coffee and tea. Diet carbonated beverages. Sweets and desserts Sherbet, gelatin, and fruit ice. Small amounts of dark chocolate. Limit all sweets and desserts. Seasonings and condiments All seasonings and condiments. The items listed above may not be a complete list of foods and drinks you can eat. Contact a dietitian for more options. What foods should I avoid? Fruits Canned fruit in heavy syrup. Fruit in cream or butter sauce. Fried fruit. Limit coconut. Vegetables Vegetables cooked in cheese, cream, or butter sauce. Fried vegetables. Grains Breads that are made with saturated or trans fats, oils, or whole milk. Croissants.  Sweet rolls. Donuts. High-fat crackers, such as cheese crackers. Meats and other proteins Fatty meats, such as hot dogs, ribs, sausage, bacon, rib-eye roast or steak. High-fat deli meats, such as salami and bologna. Caviar. Domestic duck and goose. Organ meats, such as liver. Dairy Cream, sour cream, cream cheese, and creamed cottage cheese. Whole-milk cheeses. Whole or 2% milk that is liquid, evaporated, or condensed. Whole buttermilk. Cream sauce or high-fat cheese sauce. Yogurt that is made from whole milk. Fats and oils Meat fat, or shortening. Cocoa butter, hydrogenated oils, palm oil, coconut oil, palm kernel oil. Solid fats and shortenings, including bacon fat, salt pork, lard, and butter. Nondairy cream substitutes.  Salad dressings with cheese or sour cream. Beverages Regular sodas and juice drinks with added sugar. Sweets and desserts Frosting. Pudding. Cookies. Cakes. Pies. Milk chocolate or white chocolate. Buttered syrups. Full-fat ice cream or ice cream drinks. The items listed above may not be a complete list of foods and drinks to avoid. Contact a dietitian for more information. Summary  Heart-healthy meal planning includes eating less unhealthy fats, eating more healthy fats, and making other changes in your diet.  Eat a balanced diet. This includes fruits and vegetables, low-fat or nonfat dairy, lean protein, nuts and legumes, whole grains, and heart-healthy oils and fats. This information is not intended to replace advice given to you by your health care provider. Make sure you discuss any questions you have with your health care provider. Document Revised: 04/27/2017 Document Reviewed: 03/31/2017 Elsevier Patient Education  2021 [place] cardiac CT is scheduled at:   [facility] [address] [place], [place] [address] [phone]  If scheduled at [facility], please arrive at the [facility] main entrance of [facility] 30 minutes prior to test start time. Proceed to the [facility] Radiology Department (first floor) to check-in and test prep.  Please follow these instructions carefully (unless otherwise directed):  Hold all erectile dysfunction medications at least 3 days (72 hrs) prior to test.  On the Night Before the Test: . Be sure to Drink plenty of water. . Do not consume any caffeinated/decaffeinated beverages or chocolate 12 hours prior to your test. . Do not take any antihistamines 12 hours prior to your test.  On the Day of the Test: . Drink plenty of water. Do not drink any water within one hour of the test. . Do not eat any food 4 hours prior to the test. . You may take your regular medications prior to the test.  . Take  metoprolol (Lopressor) two hours prior to test. . HOLD Furosemide/Hydrochlorothiazide morning of the test. . FEMALES- please wear underwire-free bra if available        -Drink plenty of water       -Hold Furosemide/hydrochlorothiazide morning of the test           After the Test: . Drink plenty of water. . After receiving IV contrast, you may experience a mild flushed feeling. This is normal. . On occasion, you may experience a mild rash up to 24 hours after the test. This is not dangerous. If this occurs, you can take Benadryl 25 mg and increase your fluid intake. . If you experience trouble breathing, this can be serious. If it is severe call 911 IMMEDIATELY. If it is mild, please call our office. . If you take any of these medications: Glipizide/Metformin, Avandament, Glucavance, please do not take 48 hours after completing test unless otherwise  instructed.   Once we have confirmed authorization from your insurance company, we will call you to set up a date and time for your test. Based on how quickly your insurance processes prior authorizations requests, please allow up to 4 weeks to be contacted for scheduling your Cardiac CT appointment. Be advised that routine Cardiac CT appointments could be scheduled as many as 8 weeks after your provider has ordered it.  For non-scheduling related questions, please contact the cardiac imaging nurse navigator should you have any questions/concerns: Marchia Bond, Cardiac Imaging Nurse Navigator Burley Saver, Interim Cardiac Imaging Nurse [place] [facility] Direct Office Dial: [phone]   For scheduling needs, including cancellations and rescheduling, please call [place], [phone].

## 2020-04-13 ENCOUNTER — Other Ambulatory Visit: Payer: Self-pay | Admitting: Cardiovascular Disease

## 2020-04-13 DIAGNOSIS — I5022 Chronic systolic (congestive) heart failure: Secondary | ICD-10-CM

## 2020-04-13 DIAGNOSIS — I2510 Atherosclerotic heart disease of native coronary artery without angina pectoris: Secondary | ICD-10-CM

## 2020-04-14 ENCOUNTER — Other Ambulatory Visit: Payer: Self-pay

## 2020-04-14 ENCOUNTER — Ambulatory Visit (HOSPITAL_COMMUNITY)
Admission: RE | Admit: 2020-04-14 | Discharge: 2020-04-14 | Disposition: A | Discharge status: Home / Self Care | Payer: Medicare HMO | Source: Ambulatory Visit | Attending: Cardiovascular Disease | Admitting: Cardiovascular Disease

## 2020-04-14 ENCOUNTER — Ambulatory Visit (INDEPENDENT_AMBULATORY_CARE_PROVIDER_SITE_OTHER): Payer: Medicare HMO

## 2020-04-14 ENCOUNTER — Other Ambulatory Visit: Payer: Self-pay | Admitting: General Practice

## 2020-04-14 DIAGNOSIS — I5022 Chronic systolic (congestive) heart failure: Secondary | ICD-10-CM | POA: Diagnosis not present

## 2020-04-14 DIAGNOSIS — Z95810 Presence of automatic (implantable) cardiac defibrillator: Secondary | ICD-10-CM | POA: Diagnosis not present

## 2020-04-14 DIAGNOSIS — R0989 Other specified symptoms and signs involving the circulatory and respiratory systems: Secondary | ICD-10-CM | POA: Insufficient documentation

## 2020-04-14 DIAGNOSIS — I6529 Occlusion and stenosis of unspecified carotid artery: Secondary | ICD-10-CM

## 2020-04-14 DIAGNOSIS — I6523 Occlusion and stenosis of bilateral carotid arteries: Secondary | ICD-10-CM

## 2020-04-15 ENCOUNTER — Other Ambulatory Visit: Payer: Self-pay

## 2020-04-15 DIAGNOSIS — I6529 Occlusion and stenosis of unspecified carotid artery: Secondary | ICD-10-CM

## 2020-04-16 ENCOUNTER — Telehealth: Payer: Self-pay | Admitting: General Practice

## 2020-04-16 NOTE — Telephone Encounter (Signed)
Left message for patient to cal and discuss scheduling the Chest CT ordered by Coletta Memos, nP

## 2020-04-17 NOTE — Telephone Encounter (Signed)
Left message for patient to call and discuss scheduling the CT chest ordered by Johnanna Schneiders, NP

## 2020-04-21 ENCOUNTER — Telehealth: Payer: Self-pay | Admitting: Cardiovascular Disease

## 2020-04-21 NOTE — Telephone Encounter (Signed)
   Pt c/o swelling: STAT is pt has developed SOB within 24 hours  1) How much weight have you gained and in what time span?   2) If swelling, where is the swelling located? Feet and legs   3) Are you currently taking a fluid pill? Yes  4) Are you currently SOB? No  5) Do you have a log of your daily weights (if so, list)?   6) Have you gained 3 pounds in a day or 5 pounds in a week? 5 lbs in a week  7) Have you traveled recently? No  Pt said her feet and legs are swelling pretty bad and last night instead of taking 1 and a half pill of lasix she took 2 pills. She said it help a little but still swelling. She does feel like she gained 5 lbs in a week. She said she sent transmission today and need to speak with a nurse

## 2020-04-21 NOTE — Telephone Encounter (Signed)
Transmission received 2/14 shows Normal device function.  No arrhythmias.    Corvue impedence is currentl;y at threshold although just starting to trend down which indicates possible fluid retention.

## 2020-04-21 NOTE — Telephone Encounter (Signed)
Returned the call to the patient. She stated that she has had some edema bilaterally with a 4 pound weight gain in a week. Her current weight is 147.1 pounds (normally 143-144). She denies shortness of breath.   She normally takes Furosemide 80 mg in the morning and 40 in the evening. Yesterday she took an extra lasix in the evening and did see some results. She will take an extra one tonight as well.  She stated that she does not usually watch her sodium intake and is on her feet or sitting most of the day. She has been advised to limit her salt intake and to elevate her legs when she is sitting.   She sent a transmission yesterday as well.

## 2020-04-22 NOTE — Progress Notes (Signed)
EPIC Encounter for ICM Monitoring  Patient Name: [name] is a 80 y.o. female Date: 04/22/2020 Primary Care Physican: Maximiano Coss, NP Primary Cardiologist:Berry Electrophysiologist: Vergie Living Pacing: >99% 2/15/2022Weight:147.1 lbs (baseline 143-144lbs)  Transmission reviewed. Pt contacted Dr Kennon Holter office 2/15 regarding weight gain, swelling of feet and legs.  She is taking extra Furosemide with some relief but does not follow low salt diet.  Corvue thoracic impedancesuggestingpossible fluid accumulation starting 04/20/2020.  Prescribed:  Furosemide 40 mgontake 2 tablets (80 mg total) every morning and 1 tablet (40 mg total) every evening.  Potassium 20 mEqOnMonday, Wednesday AND Friday TAKE 1 TAB BY MOUTH 2 TIMES A DAYand on the days you take lasix.  Labs: 01/26/2021 Creatinine 1.13, BUN 17, Potassium 4.4, Sodium 139, GFR 47-54 10/11/2019 Creatinine 1.16, BUN 19, Potassium 4.6, Sodium 141, GFR 46-52 07/03/2019 Creatinine1.22, BUN16, Potassium4.1, [id number], [id number] 05/14/2019 Creatinine1.31, BUN19, Potassium5.4, [id number], [id number] 04/17/2019 Creatinine 1.22, BUN 16, Potassium 4.3, Sodium 143, GFR 43-49 A complete set of results can be found in Results Review.  Recommendations:Pt self adjusting Lasix.  Follow-up plan: ICM clinic phone appointment on2/28/2022 to recheck fluid levels. 91 day device clinic remote transmission4/13/2022.   EP/Cardiology Office Visits:Recall for8/26/2022with Dr Caryl Comes.Recall 3/1/2022with Dr. Gwenlyn Found.   Copy of ICM check sent to [place]    3 month ICM trend: 04/20/2020.    1 Year ICM trend:       [name], RN 04/22/2020 10:29 AM

## 2020-04-22 NOTE — Telephone Encounter (Signed)
Attempted to reach the patient. Was unable to leave a message 

## 2020-04-27 NOTE — Telephone Encounter (Signed)
Per the patient request, a message of Dr. Olin Pia recommendations has been left on her voicemail. She has been advised to call back if she needs clarification and to also call back with an update.

## 2020-04-27 NOTE — Telephone Encounter (Signed)
    Py is returning call, she said she's having issue with her phone, but to give her a detailed message if she unable to answer call

## 2020-04-27 NOTE — Telephone Encounter (Signed)
[name] good morning Lets hav her try for three AM in a row to take all (ie furosemide 120) at one dose and see if doesn't cause urination  Thanks SK

## 2020-04-27 NOTE — Telephone Encounter (Signed)
Spoke with the patient. She stated that her edema is not any better. Her weight fluctuates from 3-6 pounds over her norm. This morning she was 146.5 pounds. She denies shortness of breath.  She has been trying to limit her sodium intake but has not seen a decrease in the edema. She currently takes Furosemide 80 in the am and 40 in the pm.   She stated that she missed a call from someone that called about her transmission. She asked that a message be left on her voicemail if she is not home.

## 2020-04-28 NOTE — Telephone Encounter (Signed)
Call back to patient after remote transmission review.  Advised report suggest possible fluid accumulation 2/14-2/15 and 2/21.  Weight is down 3 lbs from yesterday and swelling in feet have improved.  She took 1st 120 mg Furosemide dosage this AM and was instructed to take same dosage for next 2 days by Dr Caryl Comes.  Advised to take the Furosemide 120 mg tomorrow morning and advised if symptoms have resolved after 2nd dosage she can hold the 3rd day of Furosemide 120 mg and return to prescribed previous dosage.  She verbalized understanding.  Will recheck remote transmission fluid levels on 05/04/2020.  Copy sent to Dr Caryl Comes to review updated report and condition update.   04/28/2020 report

## 2020-04-28 NOTE — Telephone Encounter (Signed)
Spoke with patient for follow up.  Patient stated she took her first of 3 recommended Furosemide dosage of 120 mg this AM.  She said she lost 3 lbs since last night and current weight is 143 lbs.  She will send a manual device remote transmission today for review.

## 2020-05-04 ENCOUNTER — Ambulatory Visit
Admission: RE | Admit: 2020-05-04 | Discharge: 2020-05-04 | Disposition: A | Discharge status: Home / Self Care | Payer: Medicare HMO | Source: Ambulatory Visit | Attending: General Practice | Admitting: General Practice

## 2020-05-04 ENCOUNTER — Other Ambulatory Visit: Payer: Self-pay

## 2020-05-04 ENCOUNTER — Ambulatory Visit (INDEPENDENT_AMBULATORY_CARE_PROVIDER_SITE_OTHER): Payer: Medicare HMO

## 2020-05-04 DIAGNOSIS — I5022 Chronic systolic (congestive) heart failure: Secondary | ICD-10-CM

## 2020-05-04 DIAGNOSIS — R911 Solitary pulmonary nodule: Secondary | ICD-10-CM

## 2020-05-04 DIAGNOSIS — J984 Other disorders of lung: Secondary | ICD-10-CM | POA: Diagnosis not present

## 2020-05-04 DIAGNOSIS — Z85818 Personal history of malignant neoplasm of other sites of lip, oral cavity, and pharynx: Secondary | ICD-10-CM | POA: Diagnosis not present

## 2020-05-04 DIAGNOSIS — Z95810 Presence of automatic (implantable) cardiac defibrillator: Secondary | ICD-10-CM

## 2020-05-04 DIAGNOSIS — I708 Atherosclerosis of other arteries: Secondary | ICD-10-CM | POA: Diagnosis not present

## 2020-05-04 DIAGNOSIS — I2510 Atherosclerotic heart disease of native coronary artery without angina pectoris: Secondary | ICD-10-CM | POA: Diagnosis not present

## 2020-05-05 ENCOUNTER — Telehealth: Payer: Self-pay

## 2020-05-05 NOTE — Telephone Encounter (Signed)
Remote ICM transmission received.  Attempted call to patient regarding ICM remote transmission and left detailed message per DPR.  Advised to return call for any fluid symptoms or questions. Next ICM remote transmission scheduled 06/01/2020.

## 2020-05-05 NOTE — Progress Notes (Signed)
EPIC Encounter for ICM Monitoring  Patient Name: [name] is a 80 y.o. female Date: 05/05/2020 Primary Care Physican: Maximiano Coss, NP Primary Cardiologist:Berry Electrophysiologist: Vergie Living Pacing: >99% 2/15/2022Weight:147.1 lbs (baseline 143-144lbs)  Attempted call to patient and unable to reach.  Left detailed message per DPR regarding transmission. Transmission reviewed.   Corvue thoracic impedancesuggestingfluid levels returned to normal after taking Furosemide 120 mg x 2-3 days as instructed by Dr Caryl Comes.  Prescribed:  Furosemide 40 mgontake 2 tablets (80 mg total) every morning and 1 tablet (40 mg total) every evening.  Potassium 20 mEqOnMonday, Wednesday AND Friday TAKE 1 TAB BY MOUTH 2 TIMES A DAYand on the days you take lasix.  Labs: 01/27/2020 Creatinine 1.13, BUN 17, Potassium 4.4, Sodium 139, GFR 47-54 10/11/2019 Creatinine 1.16, BUN 19, Potassium 4.6, Sodium 141, GFR 46-52 07/03/2019 Creatinine1.22, BUN16, Potassium4.1, [id number], [id number] 05/14/2019 Creatinine1.31, BUN19, Potassium5.4, [id number], [id number] 04/17/2019 Creatinine 1.22, BUN 16, Potassium 4.3, Sodium 143, GFR 43-49 A complete set of results can be found in Results Review.  Recommendations:Left voice mail with ICM number and encouraged to call if experiencing any fluid symptoms.  Follow-up plan: ICM clinic phone appointment on3/28/2022. 91 day device clinic remote transmission4/13/2022.   EP/Cardiology Office Visits:Recall for8/26/2022with Dr Caryl Comes.Recall 3/1/2022with Dr. Gwenlyn Found.   Copy of ICM check sent to [place]   3 month ICM trend: 05/04/2020.    1 Year ICM trend:       [name], RN 05/05/2020 8:43 AM

## 2020-05-08 ENCOUNTER — Encounter: Payer: Self-pay | Admitting: Registered Nurse

## 2020-05-08 ENCOUNTER — Other Ambulatory Visit: Payer: Self-pay

## 2020-05-08 ENCOUNTER — Other Ambulatory Visit: Payer: Self-pay | Admitting: Registered Nurse

## 2020-05-08 DIAGNOSIS — R911 Solitary pulmonary nodule: Secondary | ICD-10-CM

## 2020-05-08 DIAGNOSIS — I2510 Atherosclerotic heart disease of native coronary artery without angina pectoris: Secondary | ICD-10-CM

## 2020-05-08 DIAGNOSIS — I700 Atherosclerosis of aorta: Secondary | ICD-10-CM

## 2020-05-08 DIAGNOSIS — Z8521 Personal history of malignant neoplasm of larynx: Secondary | ICD-10-CM

## 2020-05-08 DIAGNOSIS — R918 Other nonspecific abnormal finding of lung field: Secondary | ICD-10-CM

## 2020-05-11 ENCOUNTER — Telehealth: Payer: Self-pay | Admitting: Registered Nurse

## 2020-05-11 NOTE — Telephone Encounter (Signed)
Crystal from [place] is wanting to know if the pt should still get a CT CHEST LUNG CANCER SCREENING LOW DOSE WO CONTRAST done. The pt had a CT Chest Wo Contrast done on 05/04/20. Please advise Crystal at [phone] ext [phone].

## 2020-05-12 ENCOUNTER — Telehealth: Payer: Self-pay | Admitting: Registered Nurse

## 2020-05-12 ENCOUNTER — Telehealth: Payer: Self-pay | Admitting: General Practice

## 2020-05-12 NOTE — Telephone Encounter (Signed)
See result note.  

## 2020-05-12 NOTE — Telephone Encounter (Signed)
Error

## 2020-05-12 NOTE — Telephone Encounter (Signed)
[facility] calling asking if you want CT lung with contrast as pt has already had one w/o on 05/04/2020. Please advise

## 2020-05-12 NOTE — Telephone Encounter (Signed)
[name] is calling stating she reviewed her CT results through MyChart and is requesting a callback to discuss the results further with a nurse. Please advise.

## 2020-05-13 ENCOUNTER — Other Ambulatory Visit: Payer: Self-pay

## 2020-05-13 ENCOUNTER — Ambulatory Visit (INDEPENDENT_AMBULATORY_CARE_PROVIDER_SITE_OTHER): Payer: Medicare HMO | Admitting: Registered Nurse

## 2020-05-13 ENCOUNTER — Encounter: Payer: Self-pay | Admitting: Registered Nurse

## 2020-05-13 VITALS — BP 126/64 | HR 82 | Temp 98.0°F | Resp 18 | Ht 66.0 in | Wt 147.6 lb

## 2020-05-13 DIAGNOSIS — Z79899 Other long term (current) drug therapy: Secondary | ICD-10-CM

## 2020-05-13 DIAGNOSIS — R918 Other nonspecific abnormal finding of lung field: Secondary | ICD-10-CM | POA: Diagnosis not present

## 2020-05-13 NOTE — Patient Instructions (Signed)
° ° ° °  If you have lab work done today you will be contacted with your lab results within the next 2 weeks.  If you have not heard from us then please contact us. The fastest way to get your results is to register for My Chart. ° ° °IF you received an x-ray today, you will receive an invoice from [facility]. Please contact [facility] at [phone] with questions or concerns regarding your invoice.  ° °IF you received labwork today, you will receive an invoice from [facility]. Please contact [facility] at [phone] with questions or concerns regarding your invoice.  ° °Our billing staff will not be able to assist you with questions regarding bills from these companies. ° °You will be contacted with the lab results as soon as they are available. The fastest way to get your results is to activate your My Chart account. Instructions are located on the last page of this paperwork. If you have not heard from us regarding the results in 2 weeks, please contact this office. °  ° ° ° °

## 2020-05-19 NOTE — Telephone Encounter (Signed)
[facility] informed and will schedule 3 mon out

## 2020-05-19 NOTE — Telephone Encounter (Signed)
Yes - would like to have one done in around 3 mo to follow up on abnormal finding.  Thank you  Rich

## 2020-05-29 ENCOUNTER — Other Ambulatory Visit: Payer: Self-pay

## 2020-05-29 MED ORDER — POTASSIUM CHLORIDE CRYS ER 20 MEQ PO TBCR: EXTENDED_RELEASE_TABLET | ORAL | 3 refills | Status: DC

## 2020-06-01 ENCOUNTER — Ambulatory Visit (INDEPENDENT_AMBULATORY_CARE_PROVIDER_SITE_OTHER): Payer: Medicare HMO

## 2020-06-01 DIAGNOSIS — Z95810 Presence of automatic (implantable) cardiac defibrillator: Secondary | ICD-10-CM

## 2020-06-01 DIAGNOSIS — I5022 Chronic systolic (congestive) heart failure: Secondary | ICD-10-CM

## 2020-06-03 NOTE — Progress Notes (Signed)
EPIC Encounter for ICM Monitoring  Patient Name: [name] is a 80 y.o. female Date: 06/03/2020 Primary Care Physican: Maximiano Coss, NP Primary Cardiologist:Berry Electrophysiologist: Vergie Living Pacing: >99% 3/30/2022Weight:145 lbs (baseline143-144lbs)  Spoke with patient and reports feeling well at this time.  Denies fluid symptoms.    Corvue thoracic impedancesuggestingnormal fluid levels.  Prescribed:  Furosemide 40 mgontake 2 tablets (80 mg total) every morning and 1 tablet (40 mg total) every evening.  Potassium 20 mEqOnMonday, Wednesday AND Friday TAKE 1 TAB BY MOUTH 2 TIMES A DAYand on the days you take lasix.  Labs: 01/27/2020 Creatinine 1.13, BUN 17, Potassium 4.4, Sodium 139, GFR 47-54 10/11/2019 Creatinine 1.16, BUN 19, Potassium 4.6, Sodium 141, GFR 46-52 07/03/2019 Creatinine1.22, BUN16, Potassium4.1, [id number], [id number] 05/14/2019 Creatinine1.31, BUN19, Potassium5.4, [id number], [id number] 04/17/2019 Creatinine 1.22, BUN 16, Potassium 4.3, Sodium 143, GFR 43-49 A complete set of results can be found in Results Review.  Recommendations:No changes and encouraged to call if experiencing any fluid symptoms.  Follow-up plan: ICM clinic phone appointment on5/04/2020. 91 day device clinic remote transmission4/13/2022.   EP/Cardiology Office Visits:Recall for8/26/2022with Dr Caryl Comes.Recall 3/1/2022with Dr. Gwenlyn Found.   Copy of ICM check sent to [place].   3 month ICM trend: 06/01/2020.    1 Year ICM trend:       [name], RN 06/03/2020 12:53 PM

## 2020-06-05 ENCOUNTER — Telehealth: Payer: Self-pay | Admitting: Registered Nurse

## 2020-06-05 ENCOUNTER — Telehealth: Payer: Self-pay | Admitting: Cardiovascular Disease

## 2020-06-05 DIAGNOSIS — I2510 Atherosclerotic heart disease of native coronary artery without angina pectoris: Secondary | ICD-10-CM

## 2020-06-05 DIAGNOSIS — I5022 Chronic systolic (congestive) heart failure: Secondary | ICD-10-CM

## 2020-06-05 NOTE — Telephone Encounter (Signed)
*  STAT* If patient is at the pharmacy, call can be transferred to refill team.   1. Which medications need to be refilled? (please list name of each medication and dose if known) furosemide (LASIX) 40 MG tablet potassium chloride SA (KLOR-CON) 20 MEQ tablet clopidogrel (PLAVIX) 75 MG tablet 2. Which pharmacy/location (including street and city if local pharmacy) is medication to be sent to? [facility] [id number] [place] [address]  3. Do they need a 30 day or 90 day supply? 90 days supply  Pt states [employer] is also faxing a request

## 2020-06-05 NOTE — Telephone Encounter (Signed)
Pt called in stating that moving forward pt is going to be using [facility].   They will be sending over a fax and she will need a 90 day supply.

## 2020-06-08 ENCOUNTER — Other Ambulatory Visit: Payer: Self-pay | Admitting: Registered Nurse

## 2020-06-08 DIAGNOSIS — R202 Paresthesia of skin: Secondary | ICD-10-CM

## 2020-06-08 MED ORDER — FUROSEMIDE 40 MG PO TABS: ORAL_TABLET | ORAL | 1 refills | Status: DC

## 2020-06-08 MED ORDER — POTASSIUM CHLORIDE CRYS ER 20 MEQ PO TBCR: EXTENDED_RELEASE_TABLET | ORAL | 1 refills | Status: DC

## 2020-06-08 MED ORDER — CLOPIDOGREL BISULFATE 75 MG PO TABS: 75.0000 mg | ORAL_TABLET | Freq: Every day | ORAL | 1 refills | Status: DC

## 2020-06-17 ENCOUNTER — Ambulatory Visit (INDEPENDENT_AMBULATORY_CARE_PROVIDER_SITE_OTHER): Payer: Medicare HMO

## 2020-06-17 DIAGNOSIS — I5022 Chronic systolic (congestive) heart failure: Secondary | ICD-10-CM | POA: Diagnosis not present

## 2020-06-19 LAB — CUP PACEART REMOTE DEVICE CHECK
Battery Remaining Longevity: 24 mo
Battery Remaining Percentage: 26 %
Battery Voltage: 2.84 V
Brady Statistic AP VP Percent: 11 %
Brady Statistic AP VS Percent: 1 %
Brady Statistic AS VP Percent: 89 %
Brady Statistic AS VS Percent: 1 %
Brady Statistic RA Percent Paced: 11 %
Date Time Interrogation Session: 20220413044734
HighPow Impedance: 71 Ohm
HighPow Impedance: 71 Ohm
Implantable Lead Implant Date: 20160406
Implantable Lead Implant Date: 20160406
Implantable Lead Implant Date: 20160406
Implantable Lead Location: 753858
Implantable Lead Location: 753859
Implantable Lead Location: 753860
Implantable Lead Model: 7122
Implantable Pulse Generator Implant Date: 20160406
Lead Channel Impedance Value: 1250 Ohm
Lead Channel Impedance Value: 350 Ohm
Lead Channel Impedance Value: 640 Ohm
Lead Channel Pacing Threshold Amplitude: 0.75 V
Lead Channel Pacing Threshold Amplitude: 0.75 V
Lead Channel Pacing Threshold Amplitude: 0.75 V
Lead Channel Pacing Threshold Pulse Width: 0.5 ms
Lead Channel Pacing Threshold Pulse Width: 0.5 ms
Lead Channel Pacing Threshold Pulse Width: 0.5 ms
Lead Channel Sensing Intrinsic Amplitude: 12 mV
Lead Channel Sensing Intrinsic Amplitude: 5 mV
Lead Channel Setting Pacing Amplitude: 1.75 V
Lead Channel Setting Pacing Amplitude: 2 V
Lead Channel Setting Pacing Amplitude: 2 V
Lead Channel Setting Pacing Pulse Width: 0.5 ms
Lead Channel Setting Pacing Pulse Width: 0.5 ms
Lead Channel Setting Sensing Sensitivity: 0.5 mV
Pulse Gen Serial Number: 7199559

## 2020-07-01 NOTE — Progress Notes (Signed)
Remote ICD transmission.   

## 2020-07-06 ENCOUNTER — Ambulatory Visit (INDEPENDENT_AMBULATORY_CARE_PROVIDER_SITE_OTHER): Payer: Medicare HMO

## 2020-07-06 DIAGNOSIS — Z95810 Presence of automatic (implantable) cardiac defibrillator: Secondary | ICD-10-CM

## 2020-07-06 DIAGNOSIS — I5022 Chronic systolic (congestive) heart failure: Secondary | ICD-10-CM | POA: Diagnosis not present

## 2020-07-07 ENCOUNTER — Other Ambulatory Visit: Payer: Self-pay

## 2020-07-07 DIAGNOSIS — Z794 Long term (current) use of insulin: Secondary | ICD-10-CM

## 2020-07-07 DIAGNOSIS — E119 Type 2 diabetes mellitus without complications: Secondary | ICD-10-CM

## 2020-07-07 DIAGNOSIS — R202 Paresthesia of skin: Secondary | ICD-10-CM

## 2020-07-07 DIAGNOSIS — I2510 Atherosclerotic heart disease of native coronary artery without angina pectoris: Secondary | ICD-10-CM

## 2020-07-07 DIAGNOSIS — E039 Hypothyroidism, unspecified: Secondary | ICD-10-CM

## 2020-07-07 MED ORDER — BD PEN NEEDLE MICRO U/F 32G X 6 MM MISC: 5 refills | Status: DC

## 2020-07-07 MED ORDER — LEVEMIR FLEXTOUCH 100 UNIT/ML ~~LOC~~ SOPN: PEN_INJECTOR | SUBCUTANEOUS | 0 refills | Status: DC

## 2020-07-07 MED ORDER — CARVEDILOL 12.5 MG PO TABS: ORAL_TABLET | ORAL | 2 refills | Status: DC

## 2020-07-07 MED ORDER — SIMVASTATIN 20 MG PO TABS: ORAL_TABLET | ORAL | 3 refills | Status: DC

## 2020-07-07 MED ORDER — LEVOTHYROXINE SODIUM 88 MCG PO TABS: 88.0000 ug | ORAL_TABLET | Freq: Every day | ORAL | 0 refills | Status: DC

## 2020-07-07 MED ORDER — ROPINIROLE HCL 0.5 MG PO TABS: 0.5000 mg | ORAL_TABLET | Freq: Every day | ORAL | 0 refills | Status: DC

## 2020-07-07 NOTE — Progress Notes (Signed)
EPIC Encounter for ICM Monitoring  Patient Name: [name] is a 80 y.o. female Date: 07/07/2020 Primary Care Physican: Maximiano Coss, NP Primary Cardiologist:Berry Electrophysiologist: Vergie Living Pacing: >99% 07/07/2020 Weight: 150 lbs  Spoke with patient and reports feeling well at this time.  Denies fluid symptoms.    Corvue thoracic impedancesuggestingnormal fluid levels.  Prescribed:  Furosemide 40 mgontake 2 tablets (80 mg total) every morning and 1 tablet (40 mg total) every evening.  Potassium 20 mEqOnMonday, Wednesday AND Friday TAKE 1 TAB BY MOUTH 2 TIMES A DAYand on the days you take lasix.  Labs: 11/22/2021Creatinine 1.13, BUN 17, Potassium 4.4, Sodium 139, GFR 47-54 10/11/2019 Creatinine 1.16, BUN 19, Potassium 4.6, Sodium 141, GFR 46-52 07/03/2019 Creatinine1.22, BUN16, Potassium4.1, [id number], UYQ03-47 05/14/2019 Creatinine1.31, BUN19, Potassium5.4, [id number], FIE33-29 04/17/2019 Creatinine 1.22, BUN 16, Potassium 4.3, Sodium 143, GFR 43-49 A complete set of results can be found in Results Review.  Recommendations:No changes and encouraged to call if experiencing any fluid symptoms.  Follow-up plan: ICM clinic phone appointment on6/13/2022. 91 day device clinic remote transmission7/13/2022.   EP/Cardiology Office Visits:Recall for8/26/2022with Dr Caryl Comes.Recall 3/1/2022with Dr. Gwenlyn Found.   Copy of ICM check sent to [facility].   3 month ICM trend: 07/06/2020.    1 Year ICM trend:       [name], RN 07/07/2020 4:15 PM

## 2020-07-08 ENCOUNTER — Telehealth: Payer: Self-pay | Admitting: Registered Nurse

## 2020-07-08 ENCOUNTER — Other Ambulatory Visit: Payer: Self-pay

## 2020-07-08 DIAGNOSIS — E039 Hypothyroidism, unspecified: Secondary | ICD-10-CM

## 2020-07-08 MED ORDER — LEVOTHYROXINE SODIUM 88 MCG PO TABS: 88.0000 ug | ORAL_TABLET | Freq: Every day | ORAL | 0 refills | Status: DC

## 2020-07-08 NOTE — Telephone Encounter (Signed)
A 30 day supply was sent to patient's pharmacy.

## 2020-07-08 NOTE — Telephone Encounter (Signed)
Pt called in asking about the CT scan of the lungs that was to be ordered

## 2020-07-08 NOTE — Telephone Encounter (Signed)
Pt called in asking if a weeks worth or even a 30 day supply of the Levothyroxine can be sent to the [facility] on [address]   Please advise   Pt only has 2 more days of medication and the mail order told her it would be 7 to 10 days before she receives it in the mail.

## 2020-07-08 NOTE — Telephone Encounter (Signed)
She states she had one done in Feb and something found on her lung wanted to know what the next steps are to be. She is very concerned about this.

## 2020-07-10 ENCOUNTER — Other Ambulatory Visit: Payer: Self-pay

## 2020-07-10 ENCOUNTER — Other Ambulatory Visit: Payer: Self-pay | Admitting: Registered Nurse

## 2020-07-10 ENCOUNTER — Telehealth: Payer: Self-pay

## 2020-07-10 ENCOUNTER — Other Ambulatory Visit: Payer: Self-pay | Admitting: *Deleted

## 2020-07-10 ENCOUNTER — Telehealth: Payer: Self-pay | Admitting: General Practice

## 2020-07-10 DIAGNOSIS — I2510 Atherosclerotic heart disease of native coronary artery without angina pectoris: Secondary | ICD-10-CM

## 2020-07-10 DIAGNOSIS — R202 Paresthesia of skin: Secondary | ICD-10-CM

## 2020-07-10 DIAGNOSIS — R918 Other nonspecific abnormal finding of lung field: Secondary | ICD-10-CM

## 2020-07-10 MED ORDER — TRAZODONE HCL 50 MG PO TABS: 50.0000 mg | ORAL_TABLET | Freq: Every day | ORAL | 3 refills | Status: DC

## 2020-07-10 MED ORDER — IPRATROPIUM BROMIDE 0.03 % NA SOLN: 2.0000 | Freq: Two times a day (BID) | NASAL | 3 refills | Status: DC

## 2020-07-10 MED ORDER — CARVEDILOL 12.5 MG PO TABS: ORAL_TABLET | ORAL | 2 refills | Status: DC

## 2020-07-10 MED ORDER — BD PEN NEEDLE MICRO U/F 32G X 6 MM MISC: 5 refills | Status: DC

## 2020-07-10 NOTE — Telephone Encounter (Signed)
We have placed orders for CT wo contrast for nodule follow up, she should be getting a call from [address] soon   Thank you  [employer]

## 2020-07-10 NOTE — Telephone Encounter (Signed)
Sent Thanks Rich

## 2020-07-10 NOTE — Telephone Encounter (Signed)
Patient is due to have a repeat CT of the chest ( to recheck  a lung nodule) ----patient is followed by Dr. Maximiano Coss for this also.  Per Coletta Memos, NP----Dr. Orland Mustard can follow since it is not a cardiac issue.

## 2020-07-10 NOTE — Telephone Encounter (Signed)
/  Request from [facility] for new Rxs.   Request included:  BD Single use Swab (Noted in EPIC as OTC)  True Metrix Level 1 CTRL SOLN (Noted in EPIC as OTC)  BD Ultra-fine nano pen needle 32 G x 5/32" (Dr.Greene wrote refill for this as a printed Rx- can be sent if preferred)  Trazodone which has been filled before.  Last fill 01/27/2020 Amount 90 1 refill  Last OV 05/13/2020 Unknown follow up.

## 2020-07-10 NOTE — Telephone Encounter (Signed)
Called patient to relay pcp information. Patient voiced understanding.

## 2020-07-16 ENCOUNTER — Other Ambulatory Visit: Payer: Self-pay | Admitting: Registered Nurse

## 2020-07-16 ENCOUNTER — Other Ambulatory Visit: Payer: Self-pay

## 2020-07-16 ENCOUNTER — Ambulatory Visit (INDEPENDENT_AMBULATORY_CARE_PROVIDER_SITE_OTHER)
Admission: RE | Admit: 2020-07-16 | Discharge: 2020-07-16 | Disposition: A | Discharge status: Home / Self Care | Payer: Medicare HMO | Source: Ambulatory Visit | Attending: Registered Nurse | Admitting: Registered Nurse

## 2020-07-16 DIAGNOSIS — R918 Other nonspecific abnormal finding of lung field: Secondary | ICD-10-CM

## 2020-07-16 DIAGNOSIS — J439 Emphysema, unspecified: Secondary | ICD-10-CM | POA: Diagnosis not present

## 2020-07-16 DIAGNOSIS — I77810 Thoracic aortic ectasia: Secondary | ICD-10-CM | POA: Diagnosis not present

## 2020-07-16 DIAGNOSIS — J984 Other disorders of lung: Secondary | ICD-10-CM | POA: Diagnosis not present

## 2020-07-16 DIAGNOSIS — I2510 Atherosclerotic heart disease of native coronary artery without angina pectoris: Secondary | ICD-10-CM | POA: Diagnosis not present

## 2020-07-17 ENCOUNTER — Telehealth: Payer: Self-pay | Admitting: Registered Nurse

## 2020-07-17 NOTE — Telephone Encounter (Signed)
Pt called in asking for the results for the CT scan   Please advise

## 2020-07-17 NOTE — Telephone Encounter (Signed)
Left vm message per dpr informing patient

## 2020-07-17 NOTE — Telephone Encounter (Signed)
Has not yet been read by radiology. Will check early next week  Thanks  Rich

## 2020-07-20 ENCOUNTER — Encounter: Payer: Self-pay | Admitting: Podiatry

## 2020-07-20 ENCOUNTER — Ambulatory Visit (INDEPENDENT_AMBULATORY_CARE_PROVIDER_SITE_OTHER): Payer: Medicare HMO | Admitting: Podiatry

## 2020-07-20 ENCOUNTER — Ambulatory Visit (INDEPENDENT_AMBULATORY_CARE_PROVIDER_SITE_OTHER): Payer: Medicare HMO

## 2020-07-20 ENCOUNTER — Other Ambulatory Visit: Payer: Self-pay

## 2020-07-20 DIAGNOSIS — M779 Enthesopathy, unspecified: Secondary | ICD-10-CM

## 2020-07-20 DIAGNOSIS — L923 Foreign body granuloma of the skin and subcutaneous tissue: Secondary | ICD-10-CM

## 2020-07-20 NOTE — Progress Notes (Signed)
Subjective:   Patient ID: [name], female   DOB: 80 y.o.   MRN: [id number]   HPI Patient states she stepped on a piece of glass 1 week ago and she still feels it on the outside of her foot even though she did get a piece out.  States its been sore   ROS      Objective:  Physical Exam  Neurovascular status intact with no erythema edema noted right plantar with a portal of entry that can be visualized on the lateral side of the foot.  It is sore when pressed     Assessment:  Probability for a small foreign body piece of glass right plantar foot     Plan:  H&P x-ray reviewed indicating what appears to be a small piece of glass.  I anesthetized the area 60 mg like Marcaine mixture sterile prep done and using 15 blade I went ahead and opened the area up.  I probed the area extensively but it did bleed quite a bit I was not able to identify any pathology and I did flush the area out and was unable to flush out a piece of glass.  I am hoping by what I did it either we will remove her will no longer be painful but if it is it will require removal at the surgical center.  I then went ahead today and I applied sterile dressing instructed on elevation and compression and reappoint as symptoms indicate  X-rays indicate there is a small what appears to be piece of glass in the fourth intermetatarsal space right near proximity to where we see the injury

## 2020-07-27 ENCOUNTER — Encounter: Payer: Self-pay | Admitting: Registered Nurse

## 2020-07-27 ENCOUNTER — Ambulatory Visit (INDEPENDENT_AMBULATORY_CARE_PROVIDER_SITE_OTHER): Payer: Medicare HMO | Admitting: Registered Nurse

## 2020-07-27 ENCOUNTER — Other Ambulatory Visit: Payer: Self-pay

## 2020-07-27 VITALS — BP 126/46 | HR 79 | Temp 98.2°F | Resp 18 | Ht 66.0 in | Wt 148.6 lb

## 2020-07-27 DIAGNOSIS — J029 Acute pharyngitis, unspecified: Secondary | ICD-10-CM

## 2020-07-27 DIAGNOSIS — E119 Type 2 diabetes mellitus without complications: Secondary | ICD-10-CM

## 2020-07-27 DIAGNOSIS — G4730 Sleep apnea, unspecified: Secondary | ICD-10-CM | POA: Diagnosis not present

## 2020-07-27 DIAGNOSIS — R202 Paresthesia of skin: Secondary | ICD-10-CM

## 2020-07-27 DIAGNOSIS — E039 Hypothyroidism, unspecified: Secondary | ICD-10-CM | POA: Diagnosis not present

## 2020-07-27 DIAGNOSIS — Z794 Long term (current) use of insulin: Secondary | ICD-10-CM | POA: Diagnosis not present

## 2020-07-27 DIAGNOSIS — G479 Sleep disorder, unspecified: Secondary | ICD-10-CM | POA: Diagnosis not present

## 2020-07-27 MED ORDER — TRAZODONE HCL 100 MG PO TABS: 100.0000 mg | ORAL_TABLET | Freq: Every day | ORAL | 3 refills | Status: DC

## 2020-07-27 MED ORDER — AMOXICILLIN-POT CLAVULANATE 875-125 MG PO TABS: 1.0000 | ORAL_TABLET | Freq: Two times a day (BID) | ORAL | 0 refills | Status: DC

## 2020-07-27 MED ORDER — TRAZODONE HCL 50 MG PO TABS: 50.0000 mg | ORAL_TABLET | Freq: Every day | ORAL | 3 refills | Status: DC

## 2020-07-27 MED ORDER — PREDNISONE 10 MG PO TABS: 10.0000 mg | ORAL_TABLET | Freq: Every day | ORAL | 0 refills | Status: DC

## 2020-07-27 NOTE — Progress Notes (Signed)
Established Patient Office Visit  Subjective:  Patient ID: [name], female    DOB: [birth year]  Age: 80 y.o. MRN: [id number]  CC:  Chief Complaint  Patient presents with  . Results    Patient states she is here to discuss results for her CT scan. Patient has no other concerns at this time  . Medication Refill    Patient needs a medication refill and would like to discuss one medication    HPI [name] presents for imaging review and med review.  Imaging: Follow up CT for lung nodules Stability Incidental findings of aortic atherosclerosis and emphysema in the past look stable. Repeat in March 2023 planned.  Med Review Has increased trazodone to 29m nightly Wakes up every 2 hours or so at least Does not snore, but notes she has been on a CPAP in the past. Interested in seeing sleep medicine to resume this if possible Lots of daytime fatigue  Past Medical History:  Diagnosis Date  . Anxiety   . Arthritis   . Chronic pain    a. Prior h/o chronic pain on methadone.  . Chronic systolic CHF (congestive heart failure) (HCC)    a. mixed ischemic/non-ischemic cardiomyopathy. b. s/p CRT-D in 06/2014.  .Marland KitchenCKD (chronic kidney disease), stage III ([place]   . Complication of anesthesia    hard time waking her up from general surgery  . Coronary artery disease    a. remote RCA stenting in 2008 with non-DES. b. Cath 06/2014 following abnormal nuc: Stable, unchanged from prior cath, patent stent and 40% LM.  . Diabetes mellitus ([place]   . GERD (gastroesophageal reflux disease)   . Headache   . Hyperlipidemia   . Hypertension   . LBBB (left bundle branch block)   . Nonischemic cardiomyopathy ([place]   . OSA (obstructive sleep apnea)    AHI-9.77/hr, during REM-50.32/hr  . Tobacco abuse     Past Surgical History:  Procedure Laterality Date  . BACK SURGERY  2013  . BI-VENTRICULAR IMPLANTABLE CARDIOVERTER DEFIBRILLATOR N/A 06/11/2014   STJ CRTD implanted by Dr KCaryl Comes . CARDIAC  CATHETERIZATION  12/14/2006   RCA stented with a 3.0 [employer] Liberte stent resulting in a reduction of 75% to 0% residual  . CHOLECYSTECTOMY     20 years ago  . COLONOSCOPY WITH PROPOFOL N/A 12/15/2014   Procedure: COLONOSCOPY WITH PROPOFOL;  Surgeon: MClarene Essex MD;  Location: [facility];  Service: Endoscopy;  Laterality: N/A;  . EYE SURGERY     bilateral cataract surgery   . LEFT AND RIGHT HEART CATHETERIZATION WITH CORONARY ANGIOGRAM N/A 05/29/2014   Procedure: LEFT AND RIGHT HEART CATHETERIZATION WITH CORONARY ANGIOGRAM;  Surgeon: JLorretta Harp MD;  [facility];  Service: Cardiovascular;  Laterality: N/A;  . LEFT HEART CATHETERIZATION WITH CORONARY ANGIOGRAM N/A 12/27/2012   Procedure: LEFT HEART CATHETERIZATION WITH CORONARY ANGIOGRAM;  Surgeon: JLorretta Harp MD;  [facility];  Service: Cardiovascular;  Laterality: N/A;    Family History  Problem Relation Age of Onset  . Stroke Mother   . Hypertension Mother   . Coronary artery disease Father   . Stroke Brother   . Heart disease Brother   . Other Brother        H1N1 VIRUS  . Healthy Sister   . Healthy Sister     Social History   Socioeconomic History  . Marital status: Widowed    Spouse name: Not on file  .  Number of children: Not on file  . Years of education: Not on file  . Highest education level: Not on file  Occupational History  . Not on file  Tobacco Use  . Smoking status: Current Every Day Smoker    Packs/day: 2.00    Years: 58.00    Pack years: 116.00    Types: Cigarettes    Start date: 12/26/1952    Last attempt to quit: 12/05/2013    Years since quitting: 6.6  . Smokeless tobacco: Never Used  . Tobacco comment: uses Vape cigarettes  Vaping Use  . Vaping Use: Never used  Substance and Sexual Activity  . Alcohol use: No    Alcohol/week: 0.0 standard drinks  . Drug use: No  . Sexual activity: Not on file  Other Topics Concern  . Not on file  Social History  Narrative   Patient lives in [place] with his husband and grandson.   Son completed suicide on September 17th of this year, patient found him.   Social Determinants of Health   Financial Resource Strain: Not on file  Food Insecurity: Not on file  Transportation Needs: Not on file  Physical Activity: Not on file  Stress: Not on file  Social Connections: Not on file  Intimate Partner Violence: Not on file    Outpatient Medications Prior to Visit  Medication Sig Dispense Refill  . ascorbic acid (VITAMIN C) 500 MG tablet Take 500 mg by mouth daily.    Marland Kitchen aspirin EC 81 MG tablet Take 81 mg by mouth at bedtime.    . blood glucose meter kit and supplies KIT Dispense based on patient and insurance preference. Use three times a day as directed. Dx. E11.65, Z79.4 1 each 11  . carvedilol (COREG) 12.5 MG tablet TAKE 1 1/2 TABLET BY MOUTH TWICE A DAY 270 tablet 2  . Cholecalciferol (DIALYVITE VITAMIN D 5000) 125 MCG (5000 UT) capsule Take 5,000 Units by mouth daily.    . clopidogrel (PLAVIX) 75 MG tablet Take 1 tablet (75 mg total) by mouth daily. 90 tablet 1  . diclofenac Sodium (VOLTAREN) 1 % GEL Apply 1 application topically 4 (four) times daily as needed (pain).    . furosemide (LASIX) 40 MG tablet TAKE 2 TABLETS BY MOUTH ONCE DAILY IN THE MORNING AND  TAKE  1  TABLET  IN  THE  EVENING 180 tablet 1  . Garlic 390 MG CAPS Take 500 mg by mouth daily.    . insulin aspart (NOVOLOG FLEXPEN) 100 UNIT/ML FlexPen Per insulin sliding scale, max TTD, 20 units. (Patient taking differently: Inject 2-3 Units into the skin daily as needed (blood sugar over 180).) 15 mL 11  . insulin detemir (LEVEMIR FLEXTOUCH) 100 UNIT/ML FlexPen INJECT 16 UNITS SUBCUTANEOUSLY ONCE DAILY AT  10  PM 15 mL 0  . Insulin Pen Needle (BD PEN NEEDLE MICRO U/F) 32G X 6 MM MISC USE NEW NEEDLE FOR EACH INJECTION OF INSULIN, FOUR TIMES DAILY 100 each 5  . ipratropium (ATROVENT) 0.03 % nasal spray Place 2 sprays into both nostrils 2  (two) times daily. 30 mL 3  . levothyroxine (SYNTHROID) 88 MCG tablet Take 1 tablet (88 mcg total) by mouth at bedtime. 30 tablet 0  . mupirocin ointment (BACTROBAN) 2 % Apply 1 application topically 3 (three) times daily. 22 g 0  . nitroGLYCERIN (NITROSTAT) 0.4 MG SL tablet Place 1 tablet (0.4 mg total) under the tongue every 5 (five) minutes as needed for chest pain. 25 tablet  3  . NONFORMULARY OR COMPOUNDED ITEM Pain cream : ketamine 5%, baclofen 2%, gabapentin 5%, lidocaine 5%, menthol 1% Order faxed to [facility]    . ONE TOUCH ULTRA TEST test strip   11  . ONETOUCH DELICA LANCETS 33H MISC   11  . oxyCODONE-acetaminophen (PERCOCET/ROXICET) 5-325 MG tablet Take 1 tablet by mouth 2 (two) times daily as needed for severe pain.    . pantoprazole (PROTONIX) 40 MG tablet Take 1 tablet (40 mg total) by mouth daily. (Patient taking differently: Take 40 mg by mouth at bedtime.) 30 tablet 3  . potassium chloride SA (KLOR-CON) 20 MEQ tablet TAKE 1 TAB BY MOUTH 2 TIMES A DAY ON Monday, Wednesday AND Friday  (days you take Lasix) 180 tablet 1  . pyridOXINE (VITAMIN B-6) 100 MG tablet Take 100 mg by mouth at bedtime.    Marland Kitchen rOPINIRole (REQUIP) 0.5 MG tablet Take 1 tablet (0.5 mg total) by mouth at bedtime. 30 tablet 0  . simvastatin (ZOCOR) 20 MG tablet TAKE 1 TABLET BY MOUTH ONCE DAILY WITH SUPPER 90 tablet 3  . triamcinolone cream (KENALOG) 0.1 % APPLY ONE APPLICATION TOPICALLY TWO TIMES DAILY (Patient taking differently: Apply 1 application topically 2 (two) times daily.) 30 g 0  . traZODone (DESYREL) 50 MG tablet Take 1 tablet (50 mg total) by mouth at bedtime. 90 tablet 3  . albuterol (VENTOLIN HFA) 108 (90 Base) MCG/ACT inhaler Inhale 2 puffs into the lungs every 6 (six) hours as needed for wheezing or shortness of breath. (Patient not taking: No sig reported) 8 g 0  . benzonatate (TESSALON) 200 MG capsule TAKE 1 CAPSULE BY MOUTH TWICE DAILY AS NEEDED FOR COUGH (Patient not taking: No sig  reported) 20 capsule 0   No facility-administered medications prior to visit.    Allergies  Allergen Reactions  . Valium [Diazepam] Swelling    face    ROS Review of Systems    Objective:    Physical Exam  BP (!) 126/46   Pulse 79   Temp 98.2 F (36.8 C) (Temporal)   Resp 18   Ht '5\' 6"'  (1.676 m)   Wt 148 lb 9.6 oz (67.4 kg)   SpO2 99%   BMI 23.98 kg/m  Wt Readings from Last 3 Encounters:  07/27/20 148 lb 9.6 oz (67.4 kg)  05/13/20 147 lb 9.6 oz (67 kg)  04/09/20 146 lb 3.2 oz (66.3 kg)     Health Maintenance Due  Topic Date Due  . URINE MICROALBUMIN  05/15/2019  . HEMOGLOBIN A1C  07/26/2020    There are no preventive care reminders to display for this patient.  Lab Results  Component Value Date   TSH 3.170 01/27/2020   Lab Results  Component Value Date   WBC 6.8 05/14/2019   HGB 15.5 05/14/2019   HCT 44.9 05/14/2019   MCV 94 05/14/2019   PLT 235 05/14/2019   Lab Results  Component Value Date   NA 139 01/27/2020   K 4.4 01/27/2020   CO2 28 01/27/2020   GLUCOSE 118 (H) 01/27/2020   BUN 17 01/27/2020   CREATININE 1.13 (H) 01/27/2020   BILITOT 0.4 10/11/2019   ALKPHOS 78 10/11/2019   AST 15 10/11/2019   ALT 11 10/11/2019   PROT 6.3 10/11/2019   ALBUMIN 4.4 10/11/2019   CALCIUM 9.8 01/27/2020   ANIONGAP 12 08/31/2016   GFR 35.10 (L) 10/06/2014   Lab Results  Component Value Date   CHOL 150 10/11/2019   Lab  Results  Component Value Date   HDL 53 10/11/2019   Lab Results  Component Value Date   LDLCALC 78 10/11/2019   Lab Results  Component Value Date   TRIG 107 10/11/2019   Lab Results  Component Value Date   CHOLHDL 2.8 10/11/2019   Lab Results  Component Value Date   HGBA1C 5.9 (H) 01/27/2020      Assessment & Plan:   Problem List Items Addressed This Visit      Endocrine   Hypothyroidism   Relevant Orders   Thyroid Panel With TSH   Insulin dependent type 2 diabetes mellitus (HCC)   Relevant Orders   CBC with  Differential/Platelet   Hemoglobin A1c    Other Visit Diagnoses    Sleep disturbance    -  Primary   Relevant Medications   traZODone (DESYREL) 100 MG tablet   Paresthesia of both feet       Relevant Medications   traZODone (DESYREL) 50 MG tablet   Other Relevant Orders   Vitamin D (25 hydroxy)   B12   Sore throat       Relevant Medications   amoxicillin-clavulanate (AUGMENTIN) 875-125 MG tablet   predniSONE (DELTASONE) 10 MG tablet   Sleep apnea with use of continuous positive airway pressure (CPAP)       Relevant Orders   Ambulatory referral to Neurology      Meds ordered this encounter  Medications  . traZODone (DESYREL) 50 MG tablet    Sig: Take 1-2 tablets (50-100 mg total) by mouth at bedtime.    Dispense:  180 tablet    Refill:  3    Order Specific Question:   Supervising Provider    Answer:   Carlota Raspberry, JEFFREY R [[id number]]  . traZODone (DESYREL) 100 MG tablet    Sig: Take 1 tablet (100 mg total) by mouth at bedtime.    Dispense:  90 tablet    Refill:  3    Order Specific Question:   Supervising Provider    Answer:   Carlota Raspberry, JEFFREY R [[id number]]  . amoxicillin-clavulanate (AUGMENTIN) 875-125 MG tablet    Sig: Take 1 tablet by mouth 2 (two) times daily.    Dispense:  20 tablet    Refill:  0    Order Specific Question:   Supervising Provider    Answer:   Carlota Raspberry, JEFFREY R [[id number]]  . predniSONE (DELTASONE) 10 MG tablet    Sig: Take 1 tablet (10 mg total) by mouth daily with breakfast.    Dispense:  6 tablet    Refill:  0    Order Specific Question:   Supervising Provider    Answer:   Carlota Raspberry, JEFFREY R [[id number]]    Follow-up: Return in about 6 months (around 01/27/2021) for a1c and tsh.    Maximiano Coss, NP

## 2020-07-27 NOTE — Patient Instructions (Addendum)
Ms. Dam -   CT scan was very reassuring, so much so that we don't need to do another until March of 2023.  We'll check on sugars, thyroid, vitamins D and b12, and blood counts today. These results will be back tomorrow. I'll be in touch.  I'll get a referral back to sleep medicine - this will likely be Dr. Asencion Partridge Dohmeier's office, she's fantastic. Her office should be in touch in the coming weeks.  Ok to go up to 100mg  trazodone nightly. Ok to vary this between 50, 75, and 100mg  based on what you feel you need. Try to make sure you're regularly active and limiting caffeine intake.  Thank you  Rich     If you have lab work done today you will be contacted with your lab results within the next 2 weeks.  If you have not heard from [place] then please contact us. The fastest way to get your results is to register for My Chart.   IF you received an x-ray today, you will receive an invoice from [facility]. Please contact [facility] at [phone] with questions or concerns regarding your invoice.   IF you received labwork today, you will receive an invoice from [facility]. Please contact [facility] at [phone] with questions or concerns regarding your invoice.   Our billing staff will not be able to assist you with questions regarding bills from these companies.  You will be contacted with the lab results as soon as they are available. The fastest way to get your results is to activate your My Chart account. Instructions are located on the last page of this paperwork. If you have not heard from [place] regarding the results in 2 weeks, please contact this office.

## 2020-07-28 LAB — THYROID PANEL WITH TSH
Free Thyroxine Index: 3.3 (ref 1.4–3.8)
T3 Uptake: 33 % (ref 22–35)
T4, Total: 9.9 ug/dL (ref 5.1–11.9)
TSH: 2.47 mIU/L (ref 0.40–4.50)

## 2020-07-28 LAB — CBC WITH DIFFERENTIAL/PLATELET
Basophils Absolute: 0.1 10*3/uL (ref 0.0–0.1)
Basophils Relative: 1.1 % (ref 0.0–3.0)
Eosinophils Absolute: 0.2 10*3/uL (ref 0.0–0.7)
Eosinophils Relative: 2.4 % (ref 0.0–5.0)
HCT: 40.9 % (ref 36.0–46.0)
Hemoglobin: 14.2 g/dL (ref 12.0–15.0)
Lymphocytes Relative: 21.9 % (ref 12.0–46.0)
Lymphs Abs: 1.7 10*3/uL (ref 0.7–4.0)
MCHC: 34.7 g/dL (ref 30.0–36.0)
MCV: 93.5 fl (ref 78.0–100.0)
Monocytes Absolute: 0.7 10*3/uL (ref 0.1–1.0)
Monocytes Relative: 8.5 % (ref 3.0–12.0)
Neutro Abs: 5.2 10*3/uL (ref 1.4–7.7)
Neutrophils Relative %: 66.1 % (ref 43.0–77.0)
Platelets: 244 10*3/uL (ref 150.0–400.0)
RBC: 4.37 Mil/uL (ref 3.87–5.11)
RDW: 13.6 % (ref 11.5–15.5)
WBC: 7.8 10*3/uL (ref 4.0–10.5)

## 2020-07-28 LAB — HEMOGLOBIN A1C: Hgb A1c MFr Bld: 6.1 % (ref 4.6–6.5)

## 2020-07-28 LAB — VITAMIN B12: Vitamin B-12: 228 pg/mL (ref 211–911)

## 2020-07-28 LAB — VITAMIN D 25 HYDROXY (VIT D DEFICIENCY, FRACTURES): VITD: 98.79 ng/mL (ref 30.00–100.00)

## 2020-07-29 ENCOUNTER — Telehealth: Payer: Self-pay | Admitting: Registered Nurse

## 2020-07-29 NOTE — Telephone Encounter (Signed)
..  Medication Refills  Last OV:  Medication:  New diabetic meter, strips, lancets  Pharmacy:  [facility] - [place]  Let patient know to contact pharmacy at the end of the day to make sure medication is ready.   Please notify patient to allow 48-72 hours to process.  Encourage patient to contact the pharmacy for refills or they can request refills through [place] out below:   Last refill:  QTY:  Refill Date:    Other Comments:   Okay for refill?  Please advise.

## 2020-07-30 ENCOUNTER — Ambulatory Visit (INDEPENDENT_AMBULATORY_CARE_PROVIDER_SITE_OTHER): Payer: Medicare HMO

## 2020-07-30 ENCOUNTER — Other Ambulatory Visit: Payer: Self-pay

## 2020-07-30 ENCOUNTER — Telehealth: Payer: Self-pay | Admitting: Urology

## 2020-07-30 ENCOUNTER — Telehealth: Payer: Self-pay | Admitting: *Deleted

## 2020-07-30 ENCOUNTER — Ambulatory Visit: Payer: Medicare HMO | Admitting: Podiatry

## 2020-07-30 DIAGNOSIS — L923 Foreign body granuloma of the skin and subcutaneous tissue: Secondary | ICD-10-CM | POA: Diagnosis not present

## 2020-07-30 DIAGNOSIS — M79671 Pain in right foot: Secondary | ICD-10-CM

## 2020-07-30 NOTE — Patient Instructions (Signed)

## 2020-07-30 NOTE — Telephone Encounter (Signed)
DOS - 08/05/20  [facility] FOREIGN BODY RIGHT --- [id number]    [employer] EFFECTIVE DATE - 03/08/03   PER [place] NO PRIOR AUTH IS REQUIRED FOR CPT CODE 72072.

## 2020-07-30 NOTE — Telephone Encounter (Signed)
Patient is returning call for an upcoming surgery appointment. Please call.

## 2020-08-02 NOTE — Progress Notes (Signed)
Subjective: 80 year old female presents the office today for follow evaluation of her symptomatic foreign object.  She was last seen in the office on the 16th with Dr. Paulla Dolly and I&D was performed in the office but not able to remove the foreign body.  Patient presents today for reevaluation she continues to have discomfort. Denies any systemic complaints such as fevers, chills, nausea, vomiting. No acute changes since last appointment, and no other complaints at this time.   Objective: AAO x3, NAD DP/PT pulses palpable bilaterally, CRT less than 3 seconds Incision noted to the right foot fifth MPJ where she stepped on the foreign body.  There is no edema, erythema or signs of infection.  Tenderness palpation of the area.  No other areas of discomfort.  Not able to palpate foreign body.  No pain with calf compression, swelling, warmth, erythema  Assessment: Foreign body right foot  Plan: -All treatment options discussed with the patient including all alternatives, risks, complications.  -New x-rays were obtained and reviewed which did reveal foreign object on the fifth MPJ plantarly. -At this time given continued pain as well as the foreign body still present discussed with her surgical intervention to attempt to remove the foreign body.  She was willing to proceed with this.  We will plan for right foot foreign body removal next week. -The incision placement as well as the postoperative course was discussed with the patient. I discussed risks of the surgery which include, but not limited to, infection, bleeding, pain, swelling, need for further surgery, delayed or nonhealing, painful or ugly scar, numbness or sensation changes, over/under correction, recurrence, transfer lesions, further deformity, unable to remove the foreign object, DVT/PE, loss of toe/foot. Patient understands these risks and wishes to proceed with surgery. The surgical consent was reviewed with the patient all 3 pages were signed.  No promises or guarantees were given to the outcome of the procedure. All questions were answered to the best of my ability. Before the surgery the patient was encouraged to call the office if there is any further questions. The surgery will be performed at the [facility] on an outpatient basis. -Monitor for any clinical signs or symptoms of infection and directed to call the office immediately should any occur or go to the ER. -Patient encouraged to call the office with any questions, concerns, change in symptoms.   [name] DPM

## 2020-08-03 ENCOUNTER — Other Ambulatory Visit: Payer: Self-pay | Admitting: Family Medicine

## 2020-08-03 DIAGNOSIS — R202 Paresthesia of skin: Secondary | ICD-10-CM

## 2020-08-04 NOTE — Telephone Encounter (Signed)
Completed,surgery tomorrow.

## 2020-08-05 ENCOUNTER — Other Ambulatory Visit: Payer: Self-pay | Admitting: Podiatry

## 2020-08-05 ENCOUNTER — Encounter: Payer: Self-pay | Admitting: Podiatry

## 2020-08-05 DIAGNOSIS — Z1881 Retained glass fragments: Secondary | ICD-10-CM | POA: Diagnosis not present

## 2020-08-05 DIAGNOSIS — L923 Foreign body granuloma of the skin and subcutaneous tissue: Secondary | ICD-10-CM | POA: Diagnosis not present

## 2020-08-05 DIAGNOSIS — M795 Residual foreign body in soft tissue: Secondary | ICD-10-CM | POA: Diagnosis not present

## 2020-08-05 DIAGNOSIS — M79672 Pain in left foot: Secondary | ICD-10-CM | POA: Diagnosis not present

## 2020-08-05 MED ORDER — CEPHALEXIN 500 MG PO CAPS: 500.0000 mg | ORAL_CAPSULE | Freq: Two times a day (BID) | ORAL | 0 refills | Status: DC

## 2020-08-05 MED ORDER — ONDANSETRON HCL 4 MG PO TABS: 4.0000 mg | ORAL_TABLET | Freq: Three times a day (TID) | ORAL | 0 refills | Status: DC | PRN

## 2020-08-05 MED ORDER — HYDROCODONE-ACETAMINOPHEN 5-325 MG PO TABS: 1.0000 | ORAL_TABLET | ORAL | 0 refills | Status: DC | PRN

## 2020-08-06 ENCOUNTER — Telehealth: Payer: Self-pay | Admitting: *Deleted

## 2020-08-06 NOTE — Telephone Encounter (Signed)
Called and spoke with the patient today and stated that I was calling to see how the patient was doing after having surgery on Wednesday with Dr Jacqualyn Posey and patient stated that she was doing good and not any fever or chills and not any nausea and the nerve block wore off last night and was icing and elevating and could wiggle the toes and slept all day yesterday and I stated to call the office if any concerns or questions. Lattie Haw

## 2020-08-10 ENCOUNTER — Other Ambulatory Visit: Payer: Self-pay

## 2020-08-10 ENCOUNTER — Ambulatory Visit (INDEPENDENT_AMBULATORY_CARE_PROVIDER_SITE_OTHER): Payer: Medicare HMO

## 2020-08-10 ENCOUNTER — Ambulatory Visit (INDEPENDENT_AMBULATORY_CARE_PROVIDER_SITE_OTHER): Payer: Medicare HMO | Admitting: Podiatry

## 2020-08-10 DIAGNOSIS — L923 Foreign body granuloma of the skin and subcutaneous tissue: Secondary | ICD-10-CM

## 2020-08-10 DIAGNOSIS — Z9889 Other specified postprocedural states: Secondary | ICD-10-CM

## 2020-08-10 NOTE — Progress Notes (Signed)
Subjective: [name] is a 80 y.o. is seen today in office s/p right foot foreign body excision preformed on 08/05/2020. They state their pain is minimal and overall she is doing much better.  She states that while she wears a surgical shoe she has no significant pain. Denies any systemic complaints such as fevers, chills, nausea, vomiting. No calf pain, chest pain, shortness of breath.   Objective: General: No acute distress, AAOx3  DP/PT pulses palpable 2/4, CRT < 3 sec to all digits.  Protective sensation intact. Motor function intact.  RIght foot: Incision is well coapted without any evidence of dehiscence with sutures intact. There is no surrounding erythema, ascending cellulitis, fluctuance, crepitus, malodor, drainage/purulence. There is no surrounding edema around the surgical site. There is slight pain along the surgical site.  No other areas of tenderness to bilateral lower extremities.  No other open lesions or pre-ulcerative lesions.  No pain with calf compression, swelling, warmth, erythema.   Assessment and Plan:  Status post right foot foreign body excision, doing well with no complications   -Treatment options discussed including all alternatives, risks, and complications -X-rays obtained reviewed.  No evidence of acute foreign body, fracture. -Incision is healing well.  Antibiotic ointment was applied followed by dressing.  She can keep dressing clean, dry, intact for now.  Discussed with her she can start to change the bandage however every couple days.  Hold off on soaking the foot but she can wash with soap and water and dry thoroughly and apply a similar bandage. -Ice/elevation -Pain medication as needed. -Monitor for any clinical signs or symptoms of infection and DVT/PE and directed to call the office immediately should any occur or go to the ER. -Follow-up in about 10 days for suture removal or sooner if any problems arise. In the meantime, encouraged to call the office  with any questions, concerns, change in symptoms.   Celesta Gentile, DPM

## 2020-08-14 ENCOUNTER — Telehealth: Payer: Self-pay | Admitting: Registered Nurse

## 2020-08-14 NOTE — Telephone Encounter (Signed)
Pt called in stating all her medications that Delfino Lovett writes for her need to go to [facility] order can we send it in again for her. Pt can be reached at the home #

## 2020-08-17 ENCOUNTER — Ambulatory Visit (INDEPENDENT_AMBULATORY_CARE_PROVIDER_SITE_OTHER): Payer: Medicare HMO

## 2020-08-17 DIAGNOSIS — Z95810 Presence of automatic (implantable) cardiac defibrillator: Secondary | ICD-10-CM | POA: Diagnosis not present

## 2020-08-17 DIAGNOSIS — I5022 Chronic systolic (congestive) heart failure: Secondary | ICD-10-CM

## 2020-08-19 ENCOUNTER — Telehealth: Payer: Self-pay

## 2020-08-19 NOTE — Progress Notes (Signed)
EPIC Encounter for ICM Monitoring  Patient Name: [name] is a 80 y.o. female Date: 08/19/2020 Primary Care Physican: Maximiano Coss, NP Primary Cardiologist: Gwenlyn Found Electrophysiologist: Vergie Living Pacing:  >99%           07/07/2020 Weight: 150 lbs                             Attempted call to patient and unable to reach.  Left detailed message per DPR regarding transmission. Transmission reviewed.     Corvue thoracic impedance suggesting normal fluid levels.   Prescribed:  Furosemide 40 mg on take 2 tablets (80 mg total) every morning and 1 tablet (40 mg total) every evening.   Potassium 20 mEq On Monday, Wednesday AND Friday TAKE 1 TAB BY MOUTH 2 TIMES A DAY and on the days you take lasix.   Labs: 01/27/2020 Creatinine 1.13, BUN 17, Potassium 4.4, Sodium 139, GFR 47-54 10/11/2019 Creatinine 1.16, BUN 19, Potassium 4.6, Sodium 141, GFR 46-52 07/03/2019 Creatinine 1.22, BUN 16, Potassium 4.1, Sodium 144, GFR 43-49 05/14/2019 Creatinine 1.31, BUN 19, Potassium 5.4, Sodium 143, GFR 39-45  04/17/2019 Creatinine 1.22, BUN 16, Potassium 4.3, Sodium 143, GFR 43-49 A complete set of results can be found in Results Review.   Recommendations:  Left voice mail with ICM number and encouraged to call if experiencing any fluid symptoms.   Follow-up plan: ICM clinic phone appointment on 09/21/2020.   91 day device clinic remote transmission 09/16/2020.     EP/Cardiology Office Visits:  Recall for 10/30/2020 with Dr Caryl Comes.   Recall 05/05/2020 with Dr. Gwenlyn Found.   Copy of ICM check sent to Dr. Caryl Comes.     3 month ICM trend: 08/17/2020.    1 Year ICM trend:       [name], RN 08/19/2020 11:50 AM

## 2020-08-19 NOTE — Telephone Encounter (Signed)
Remote ICM transmission received.  Attempted call to patient regarding ICM remote transmission and left detailed message per DPR.  Advised to return call for any fluid symptoms or questions. Next ICM remote transmission scheduled 09/21/2020.

## 2020-08-20 ENCOUNTER — Encounter: Payer: Medicare HMO | Admitting: Podiatry

## 2020-08-21 ENCOUNTER — Ambulatory Visit (INDEPENDENT_AMBULATORY_CARE_PROVIDER_SITE_OTHER): Payer: Medicare HMO | Admitting: Podiatry

## 2020-08-21 ENCOUNTER — Other Ambulatory Visit: Payer: Self-pay

## 2020-08-21 DIAGNOSIS — D126 Benign neoplasm of colon, unspecified: Secondary | ICD-10-CM | POA: Insufficient documentation

## 2020-08-21 DIAGNOSIS — R634 Abnormal weight loss: Secondary | ICD-10-CM | POA: Insufficient documentation

## 2020-08-21 DIAGNOSIS — M1990 Unspecified osteoarthritis, unspecified site: Secondary | ICD-10-CM | POA: Insufficient documentation

## 2020-08-21 DIAGNOSIS — K219 Gastro-esophageal reflux disease without esophagitis: Secondary | ICD-10-CM | POA: Insufficient documentation

## 2020-08-21 DIAGNOSIS — Z860100 Personal history of colon polyps, unspecified: Secondary | ICD-10-CM | POA: Insufficient documentation

## 2020-08-21 DIAGNOSIS — E538 Deficiency of other specified B group vitamins: Secondary | ICD-10-CM | POA: Insufficient documentation

## 2020-08-21 DIAGNOSIS — R1310 Dysphagia, unspecified: Secondary | ICD-10-CM | POA: Insufficient documentation

## 2020-08-21 DIAGNOSIS — R197 Diarrhea, unspecified: Secondary | ICD-10-CM | POA: Insufficient documentation

## 2020-08-21 DIAGNOSIS — R140 Abdominal distension (gaseous): Secondary | ICD-10-CM | POA: Insufficient documentation

## 2020-08-21 DIAGNOSIS — E1142 Type 2 diabetes mellitus with diabetic polyneuropathy: Secondary | ICD-10-CM | POA: Insufficient documentation

## 2020-08-21 DIAGNOSIS — E1149 Type 2 diabetes mellitus with other diabetic neurological complication: Secondary | ICD-10-CM | POA: Insufficient documentation

## 2020-08-21 DIAGNOSIS — K5730 Diverticulosis of large intestine without perforation or abscess without bleeding: Secondary | ICD-10-CM | POA: Insufficient documentation

## 2020-08-21 DIAGNOSIS — R159 Full incontinence of feces: Secondary | ICD-10-CM | POA: Insufficient documentation

## 2020-08-21 DIAGNOSIS — L923 Foreign body granuloma of the skin and subcutaneous tissue: Secondary | ICD-10-CM

## 2020-08-21 DIAGNOSIS — M79671 Pain in right foot: Secondary | ICD-10-CM

## 2020-08-21 DIAGNOSIS — K5900 Constipation, unspecified: Secondary | ICD-10-CM | POA: Insufficient documentation

## 2020-08-21 DIAGNOSIS — Z9889 Other specified postprocedural states: Secondary | ICD-10-CM

## 2020-08-21 MED ORDER — CEPHALEXIN 500 MG PO CAPS: 500.0000 mg | ORAL_CAPSULE | Freq: Two times a day (BID) | ORAL | 0 refills | Status: DC

## 2020-08-21 MED ORDER — AMMONIUM LACTATE 12 % EX LOTN: 1.0000 "application " | TOPICAL_LOTION | CUTANEOUS | 0 refills | Status: DC | PRN

## 2020-08-24 ENCOUNTER — Telehealth: Payer: Self-pay | Admitting: Registered Nurse

## 2020-08-24 NOTE — Telephone Encounter (Signed)
Patient needs a diabetic meter and strips sent to [facility]

## 2020-08-25 ENCOUNTER — Telehealth: Payer: Self-pay | Admitting: Registered Nurse

## 2020-08-25 NOTE — Telephone Encounter (Signed)
Patient called back and needs this called in today - she is out of strips and needs a new meter

## 2020-08-25 NOTE — Progress Notes (Signed)
Subjective: [name] is a 80 y.o. is seen today in office s/p right foot foreign body excision preformed on 08/05/2020.  She states that she feels that she is still walking on something she said that she is not sure if there is stitches still in or not.  Get some occasional swelling.  No drainage or pus or any recent injury she reports. Denies any systemic complaints such as fevers, chills, nausea, vomiting. No calf pain, chest pain, shortness of breath.   Objective: General: No acute distress, AAOx3  DP/PT pulses palpable 2/4, CRT < 3 sec to all digits.  Protective sensation intact. Motor function intact.  RIght foot: Incision is well coapted without any evidence of dehiscence with sutures intact.  There is mild edema localized to the surgical site and there is no warmth associated.  There is no fluctuation crepitation.  No drainage or pus.  There is faint erythema associated with the swelling but no ascending cellulitis or warmth of the foot. No other areas of tenderness to bilateral lower extremities.  No other open lesions or pre-ulcerative lesions.  No pain with calf compression, swelling, warmth, erythema.   Assessment and Plan:  Status post right foot foreign body excision  -Treatment options discussed including all alternatives, risks, and complications -Plan for sutures do not complications incision is well coapted.  Given the localized edema and erythema we will refill Keflex although I think is more from inflammation as opposed to infection.  Recommend elevation, icing continue with surgical shoe. -Monitor for any clinical signs or symptoms of infection and directed to call the office immediately should any occur or go to the ER.  Return in about 3 weeks (around 09/11/2020).  [name] DPM

## 2020-08-25 NOTE — Telephone Encounter (Signed)
Insurance is requiring pt uses the accu chek monitor.   [facility] into [facility]

## 2020-08-26 ENCOUNTER — Other Ambulatory Visit: Payer: Self-pay | Admitting: Registered Nurse

## 2020-08-26 DIAGNOSIS — Z794 Long term (current) use of insulin: Secondary | ICD-10-CM

## 2020-08-26 DIAGNOSIS — E119 Type 2 diabetes mellitus without complications: Secondary | ICD-10-CM

## 2020-08-26 MED ORDER — ACCU-CHEK AVIVA PLUS W/DEVICE KIT: 1.0000 | PACK | Freq: Once | 0 refills | Status: DC

## 2020-08-26 NOTE — Telephone Encounter (Signed)
Sent Thanks Rich

## 2020-09-01 ENCOUNTER — Telehealth: Payer: Self-pay

## 2020-09-01 ENCOUNTER — Encounter: Payer: Medicare HMO | Admitting: Internal Medicine

## 2020-09-01 NOTE — Telephone Encounter (Signed)
[facility] called and they are needing the supplies as well they received the meter. They need test strips, alcohol swabs and control solution  ONE TOUCH ULTRA TEST test strip   Pharmacy call back [phone]

## 2020-09-03 ENCOUNTER — Encounter: Payer: Medicare HMO | Admitting: Sports Medicine

## 2020-09-08 ENCOUNTER — Encounter: Payer: Self-pay | Admitting: Podiatry

## 2020-09-08 ENCOUNTER — Other Ambulatory Visit: Payer: Self-pay

## 2020-09-08 ENCOUNTER — Ambulatory Visit (INDEPENDENT_AMBULATORY_CARE_PROVIDER_SITE_OTHER): Payer: Medicare HMO | Admitting: Podiatry

## 2020-09-08 VITALS — BP 130/57 | HR 64 | Temp 96.1°F | Resp 14

## 2020-09-08 DIAGNOSIS — M79671 Pain in right foot: Secondary | ICD-10-CM

## 2020-09-08 DIAGNOSIS — L923 Foreign body granuloma of the skin and subcutaneous tissue: Secondary | ICD-10-CM

## 2020-09-08 DIAGNOSIS — Z9889 Other specified postprocedural states: Secondary | ICD-10-CM

## 2020-09-08 MED ORDER — DOXYCYCLINE HYCLATE 100 MG PO TABS: 100.0000 mg | ORAL_TABLET | Freq: Two times a day (BID) | ORAL | 0 refills | Status: DC

## 2020-09-08 MED ORDER — KETOCONAZOLE 2 % EX CREA
1.0000 | TOPICAL_CREAM | Freq: Every day | CUTANEOUS | 0 refills | Status: DC
Start: 2020-09-08 — End: 2021-08-11

## 2020-09-08 NOTE — Patient Instructions (Signed)

## 2020-09-10 NOTE — Progress Notes (Signed)
Subjective: [name] is a 80 y.o. is seen today in office s/p right foot foreign body excision preformed on 08/05/2020.  Overall states that she still having some discomfort as well as some swelling.  She is not able to walk anything other than surgical shoe.  There is a some bloody drainage at the end of the day on the bandage when she changes it.  Denies any systemic complaints such as fevers, chills, nausea, vomiting. No calf pain, chest pain, shortness of breath.   Objective: General: No acute distress, AAOx3  DP/PT pulses palpable 2/4, CRT < 3 sec to all digits.  Protective sensation intact. Motor function intact.  RIght foot: Hyperkeratotic tissue on the area the incision plantarly.  Small superficial skin breakdown is identified on the incision but this is where the skin is cracked open from the dry skin.  There is no probing.  There is no drainage or pus identified today.  No fluctuation crepitation.  There is mild edema to the surgical site there is no erythema or warmth. No other open lesions or pre-ulcerative lesions.  No pain with calf compression, on the area as well.  She did complete the course of the antibiotics and the redness that she had improved, warmth, erythema.   Assessment and Plan:  Status post right foot foreign body excision; edema  -Treatment options discussed including all alternatives, risks, and complications -Given her smoking I do think this is the reason for delayed healing.  Previous ABI on February 20, 2020 on the right side was in normal limits and no significant lower extremity arterial disease.  TBI was abnormal.  If further concern for delayed healing will further evaluate the vascular status.  However we will switch her antibiotic for concern of possible infection given the swelling as well as small mount of drainage.  Prescribe doxycycline.  Antibiotic ointment dressing changes.  Offloading pads were dispensed.  Epson salt soaks.  [name] DPM

## 2020-09-14 ENCOUNTER — Other Ambulatory Visit: Payer: Self-pay | Admitting: Family Medicine

## 2020-09-14 DIAGNOSIS — E039 Hypothyroidism, unspecified: Secondary | ICD-10-CM

## 2020-09-16 ENCOUNTER — Ambulatory Visit (INDEPENDENT_AMBULATORY_CARE_PROVIDER_SITE_OTHER): Payer: Medicare HMO

## 2020-09-16 DIAGNOSIS — I5022 Chronic systolic (congestive) heart failure: Secondary | ICD-10-CM

## 2020-09-16 DIAGNOSIS — I428 Other cardiomyopathies: Secondary | ICD-10-CM

## 2020-09-17 ENCOUNTER — Other Ambulatory Visit: Payer: Self-pay

## 2020-09-17 ENCOUNTER — Encounter: Payer: Medicare HMO | Admitting: Podiatry

## 2020-09-17 ENCOUNTER — Ambulatory Visit (INDEPENDENT_AMBULATORY_CARE_PROVIDER_SITE_OTHER): Payer: Medicare HMO | Admitting: Podiatry

## 2020-09-17 DIAGNOSIS — M1000 Idiopathic gout, unspecified site: Secondary | ICD-10-CM | POA: Diagnosis not present

## 2020-09-17 DIAGNOSIS — M79671 Pain in right foot: Secondary | ICD-10-CM

## 2020-09-17 DIAGNOSIS — L923 Foreign body granuloma of the skin and subcutaneous tissue: Secondary | ICD-10-CM

## 2020-09-17 MED ORDER — COLCHICINE 0.6 MG PO TABS: 0.6000 mg | ORAL_TABLET | Freq: Every day | ORAL | 0 refills | Status: DC

## 2020-09-18 ENCOUNTER — Telehealth: Payer: Self-pay | Admitting: Podiatry

## 2020-09-18 LAB — CUP PACEART REMOTE DEVICE CHECK
Battery Remaining Longevity: 25 mo
Battery Remaining Percentage: 27 %
Battery Voltage: 2.83 V
Brady Statistic AP VP Percent: 11 %
Brady Statistic AP VS Percent: 1 %
Brady Statistic AS VP Percent: 89 %
Brady Statistic AS VS Percent: 1 %
Brady Statistic RA Percent Paced: 11 %
Date Time Interrogation Session: 20220715151504
HighPow Impedance: 70 Ohm
HighPow Impedance: 70 Ohm
Implantable Lead Implant Date: 20160406
Implantable Lead Implant Date: 20160406
Implantable Lead Implant Date: 20160406
Implantable Lead Location: 753858
Implantable Lead Location: 753859
Implantable Lead Location: 753860
Implantable Lead Model: 7122
Implantable Pulse Generator Implant Date: 20160406
Lead Channel Impedance Value: 1275 Ohm
Lead Channel Impedance Value: 360 Ohm
Lead Channel Impedance Value: 590 Ohm
Lead Channel Pacing Threshold Amplitude: 0.75 V
Lead Channel Pacing Threshold Amplitude: 0.75 V
Lead Channel Pacing Threshold Amplitude: 0.875 V
Lead Channel Pacing Threshold Pulse Width: 0.5 ms
Lead Channel Pacing Threshold Pulse Width: 0.5 ms
Lead Channel Pacing Threshold Pulse Width: 0.5 ms
Lead Channel Sensing Intrinsic Amplitude: 12 mV
Lead Channel Sensing Intrinsic Amplitude: 5 mV
Lead Channel Setting Pacing Amplitude: 1.875
Lead Channel Setting Pacing Amplitude: 2 V
Lead Channel Setting Pacing Amplitude: 2 V
Lead Channel Setting Pacing Pulse Width: 0.5 ms
Lead Channel Setting Pacing Pulse Width: 0.5 ms
Lead Channel Setting Sensing Sensitivity: 0.5 mV
Pulse Gen Serial Number: 7199559

## 2020-09-18 NOTE — Telephone Encounter (Signed)
[facility] calling to see if it will be ok to switch medication from generic to brand name? insurance only covers brand name. Please advise.

## 2020-09-18 NOTE — Telephone Encounter (Signed)
LVM for pharmacy, didn't pick up

## 2020-09-21 ENCOUNTER — Ambulatory Visit (INDEPENDENT_AMBULATORY_CARE_PROVIDER_SITE_OTHER): Payer: Medicare HMO

## 2020-09-21 DIAGNOSIS — I5022 Chronic systolic (congestive) heart failure: Secondary | ICD-10-CM | POA: Diagnosis not present

## 2020-09-21 DIAGNOSIS — Z95810 Presence of automatic (implantable) cardiac defibrillator: Secondary | ICD-10-CM

## 2020-09-22 NOTE — Progress Notes (Signed)
Subjective: [name] is a 80 y.o. is seen today in office s/p right foot foreign body excision preformed on 08/05/2020.  She states that since last long she is actually made good progress.  She is been soaking in Epson salts and she is to keep it covered.  Has been able to wear a regular shoe and not the surgical shoe on the right side as well.  No redness or warmth.  No drainage that she reports.  Her other concern today is that she thinks that she has a callus on her left big toe joint.  This started all of a sudden.  Has gotten better over the last couple days.  Denies any recent injury or trauma but has noted swelling and redness to the big toe.    Objective: General: No acute distress, AAOx3  DP/PT pulses palpable 2/4, CRT < 3 sec to all digits.  Protective sensation intact. Motor function intact.  RIght foot: Hyperkeratotic tissue on the area the incision plantarly.  There is still superficial area of with the calluses, but there is no opening along the incision itself.  It appears that and scabbed over and started come off.  There is no drainage or pus.  Minimal edema there is no erythema or warmth.  Decreased tenderness to palpation.  No area pinpoint tenderness. Left foot: Localized edema erythema in the first MPJ.  Mild discomfort MPJ range of motion.  No open lesions.  No areas of fluctuance or crepitation. No pain with calf compression, on the area as well.  She did complete the course of the antibiotics and the redness that she had improved, warmth, erythema.   Assessment and Plan:  Status post right foot foreign body excision; left first MPJ likely gout  -Treatment options discussed including all alternatives, risks, and complications -4 the right foot she has been making some progress since last appointment.  I encouraged her to continue soaking in the Epsom salts cover with antibiotic ointment.  Continue offloading pads.  Regular shoe gear as tolerated. -On the left foot prescribed  colchicine.  If symptoms continue x-ray next appointment  [name] DPM

## 2020-09-23 NOTE — Progress Notes (Signed)
EPIC Encounter for ICM Monitoring  Patient Name: [name] is a 80 y.o. female Date: 09/23/2020 Primary Care Physican: Maximiano Coss, NP Primary Cardiologist: Gwenlyn Found Electrophysiologist: Vergie Living Pacing:  >99%           09/23/2020 Weight: 145-148 lbs                             Spoke with patient and heart failure questions reviewed.  Pt asymptomatic for fluid accumulation and feeing well.   Corvue thoracic impedance suggesting normal fluid levels.   Prescribed:  Furosemide 40 mg on take 2 tablets (80 mg total) every morning and 1 tablet (40 mg total) every evening.   Potassium 20 mEq On Monday, Wednesday AND Friday TAKE 1 TAB BY MOUTH 2 TIMES A DAY and on the days you take lasix.   Labs: 01/27/2020 Creatinine 1.13, BUN 17, Potassium 4.4, Sodium 139, GFR 47-54 10/11/2019 Creatinine 1.16, BUN 19, Potassium 4.6, Sodium 141, GFR 46-52 07/03/2019 Creatinine 1.22, BUN 16, Potassium 4.1, Sodium 144, GFR 43-49 05/14/2019 Creatinine 1.31, BUN 19, Potassium 5.4, Sodium 143, GFR 39-45  04/17/2019 Creatinine 1.22, BUN 16, Potassium 4.3, Sodium 143, GFR 43-49 A complete set of results can be found in Results Review.   Recommendations:  No changes and encouraged to call if experiencing any fluid symptoms.   Follow-up plan: ICM clinic phone appointment on 10/26/2020.   91 day device clinic remote transmission 12/16/2020.     EP/Cardiology Office Visits:  12/08/2020 with Dr Caryl Comes.   Recall 05/05/2020 with Dr. Gwenlyn Found.   Copy of ICM check sent to Dr. Caryl Comes.      3 month ICM trend: 09/21/2020.    1 Year ICM trend:       [name], RN 09/23/2020 10:33 AM

## 2020-09-23 NOTE — Telephone Encounter (Signed)
Called patient and patient stated that the foot is barely tender and is healing up and patient stated that she could not afford the $19.00 cream and I stated to the patient to call if any concerns or questions. Lattie Haw

## 2020-09-30 ENCOUNTER — Institutional Professional Consult (permissible substitution): Payer: Medicare HMO | Admitting: Neurology

## 2020-10-05 ENCOUNTER — Other Ambulatory Visit: Payer: Self-pay | Admitting: Cardiovascular Disease

## 2020-10-05 DIAGNOSIS — I2510 Atherosclerotic heart disease of native coronary artery without angina pectoris: Secondary | ICD-10-CM

## 2020-10-05 DIAGNOSIS — I5022 Chronic systolic (congestive) heart failure: Secondary | ICD-10-CM

## 2020-10-08 ENCOUNTER — Encounter: Payer: Medicare HMO | Admitting: Podiatry

## 2020-10-09 NOTE — Progress Notes (Signed)
Remote ICD transmission.   

## 2020-10-21 ENCOUNTER — Other Ambulatory Visit: Payer: Self-pay

## 2020-10-21 ENCOUNTER — Telehealth: Payer: Self-pay

## 2020-10-21 MED ORDER — ONETOUCH VERIO VI STRP: ORAL_STRIP | 12 refills | Status: DC

## 2020-10-21 NOTE — Telephone Encounter (Signed)
Pt wants to go back to her old meter reading since the one [facility] sent her one was no good. She needs strips to for the Once Touch Verio Reflect Strips   Sent to [facility] on [address] call back [phone]

## 2020-10-21 NOTE — Telephone Encounter (Signed)
Test strips sent to pharmacy.

## 2020-10-22 ENCOUNTER — Ambulatory Visit (INDEPENDENT_AMBULATORY_CARE_PROVIDER_SITE_OTHER): Payer: Medicare HMO

## 2020-10-22 ENCOUNTER — Ambulatory Visit (INDEPENDENT_AMBULATORY_CARE_PROVIDER_SITE_OTHER): Payer: Medicare HMO | Admitting: Podiatry

## 2020-10-22 ENCOUNTER — Other Ambulatory Visit: Payer: Self-pay

## 2020-10-22 DIAGNOSIS — Z9889 Other specified postprocedural states: Secondary | ICD-10-CM | POA: Diagnosis not present

## 2020-10-22 DIAGNOSIS — L923 Foreign body granuloma of the skin and subcutaneous tissue: Secondary | ICD-10-CM

## 2020-10-24 NOTE — Progress Notes (Signed)
Subjective: [name] is a 80 y.o. is seen today in office s/p right foot foreign body excision preformed on 08/05/2020.  She states that she still having pain points along the submetatarsal 5 area.  There is a callus present to this area.  She is only able to wear certain shoes.  Denies any swelling or redness or any drainage.  No open sores that she reports.  No fevers or chills.  No other concerns.  Objective: General: No acute distress, AAOx3  DP/PT pulses palpable 2/4, CRT < 3 sec to all digits.  Protective sensation intact. Motor function intact.  RIght foot: Hyperkeratotic tissue on the area the incision plantarly.  Upon debridement there is no open sore identified the incision is well-healed.  There is no erythema or warmth.  Slight edema.  Mild tenderness but directly on the callused area.  There is no fluctuation or crepitation.  No malodor. No pain with calf compression, erythema or warmth.  Assessment and Plan:  Status post right foot foreign body excision however with continued pain  -Treatment options discussed including all alternatives, risks, and complications -Repeat x-rays obtained and reviewed.  No evidence of acute fracture, osteomyelitis.  No evidence of foreign body -Sharply debrided the callus with any complications or bleeding.  Recommend moisturizer and offloading.  Given the continued discomfort order diagnostic ultrasound to see if there is any residual foreign body. -Monitor for any clinical signs or symptoms of infection and directed to call the office immediately should any occur or go to the ER.  No follow-ups on file.  [name] DPM

## 2020-10-26 ENCOUNTER — Other Ambulatory Visit: Payer: Self-pay

## 2020-10-26 ENCOUNTER — Ambulatory Visit (INDEPENDENT_AMBULATORY_CARE_PROVIDER_SITE_OTHER): Payer: Medicare HMO

## 2020-10-26 DIAGNOSIS — I5022 Chronic systolic (congestive) heart failure: Secondary | ICD-10-CM

## 2020-10-26 DIAGNOSIS — Z95810 Presence of automatic (implantable) cardiac defibrillator: Secondary | ICD-10-CM

## 2020-10-26 MED ORDER — ONETOUCH VERIO VI STRP: ORAL_STRIP | 12 refills | Status: DC

## 2020-10-28 NOTE — Progress Notes (Signed)
EPIC Encounter for ICM Monitoring  Patient Name: [name] is a 80 y.o. female Date: 10/28/2020 Primary Care Physican: Maximiano Coss, NP Primary Cardiologist: Gwenlyn Found Electrophysiologist: Vergie Living Pacing:  >99%           10/28/2020 Weight: 145-148 lbs                             Spoke with patient and heart failure questions reviewed.  Pt asymptomatic for fluid accumulation and feeling well.   Corvue thoracic impedance suggesting normal fluid levels.   Prescribed:  Furosemide 40 mg on take 2 tablets (80 mg total) every morning and 1 tablet (40 mg total) every evening.   Potassium 20 mEq On Monday, Wednesday AND Friday TAKE 1 TAB BY MOUTH 2 TIMES A DAY and on the days you take lasix.   Labs: 01/27/2020 Creatinine 1.13, BUN 17, Potassium 4.4, Sodium 139, GFR 47-54 10/11/2019 Creatinine 1.16, BUN 19, Potassium 4.6, Sodium 141, GFR 46-52 07/03/2019 Creatinine 1.22, BUN 16, Potassium 4.1, Sodium 144, GFR 43-49 05/14/2019 Creatinine 1.31, BUN 19, Potassium 5.4, Sodium 143, GFR 39-45  04/17/2019 Creatinine 1.22, BUN 16, Potassium 4.3, Sodium 143, GFR 43-49 A complete set of results can be found in Results Review.   Recommendations:  No changes and encouraged to call if experiencing any fluid symptoms.   Follow-up plan: ICM clinic phone appointment on 12/07/2020.   91 day device clinic remote transmission 12/16/2020.     EP/Cardiology Office Visits:  12/08/2020 with Dr Caryl Comes.   Recall 05/05/2020 with Dr. Gwenlyn Found.   Copy of ICM check sent to Dr. Caryl Comes.   3 month ICM trend: 10/26/2020.    1 Year ICM trend:       [name], RN 10/28/2020 3:45 PM

## 2020-10-29 ENCOUNTER — Other Ambulatory Visit: Payer: Medicare HMO

## 2020-11-03 ENCOUNTER — Ambulatory Visit
Admission: RE | Admit: 2020-11-03 | Discharge: 2020-11-03 | Disposition: A | Discharge status: Home / Self Care | Payer: Medicare HMO | Source: Ambulatory Visit | Attending: Podiatry | Admitting: Podiatry

## 2020-11-03 DIAGNOSIS — Z0389 Encounter for observation for other suspected diseases and conditions ruled out: Secondary | ICD-10-CM | POA: Diagnosis not present

## 2020-11-03 DIAGNOSIS — L923 Foreign body granuloma of the skin and subcutaneous tissue: Secondary | ICD-10-CM

## 2020-11-04 ENCOUNTER — Other Ambulatory Visit: Payer: Self-pay

## 2020-11-04 MED ORDER — ONETOUCH VERIO VI STRP: ORAL_STRIP | 12 refills | Status: DC

## 2020-11-05 ENCOUNTER — Telehealth: Payer: Self-pay

## 2020-11-05 ENCOUNTER — Other Ambulatory Visit: Payer: Self-pay

## 2020-11-05 MED ORDER — BLOOD GLUCOSE METER KIT: PACK | 0 refills | Status: DC

## 2020-11-05 NOTE — Telephone Encounter (Signed)
Accucheck Guide Meter and test strips and Lancet is soft clix this is what [employer] Rossetti will cover to pay and the insurance.   [place], [address] [place]   Patient has been trying to get test strips since 08/22 and now [facility] called and said I this all needed to be changed.   Pt call back [phone]

## 2020-11-05 NOTE — Telephone Encounter (Signed)
Called and spoke with patient to clarify where to send her Rx for her glucose meter and supplies. Rx sent to [facility] on [address] in [place].

## 2020-11-05 NOTE — Progress Notes (Signed)
soft

## 2020-11-06 ENCOUNTER — Other Ambulatory Visit: Payer: Self-pay

## 2020-11-06 ENCOUNTER — Telehealth: Payer: Self-pay

## 2020-11-06 MED ORDER — GLUCOSE BLOOD VI STRP: ORAL_STRIP | 4 refills | Status: DC

## 2020-11-06 NOTE — Telephone Encounter (Signed)
Fax received from pharmacy requesting new Rx for test strips be sent to the pharmacy with Dx codes attached, sent electronically to [facility] in [place] on [address]

## 2020-11-08 ENCOUNTER — Telehealth: Payer: Self-pay

## 2020-11-08 NOTE — Telephone Encounter (Signed)
Called patient got no answer or machine to leave a message on.

## 2020-11-10 ENCOUNTER — Telehealth: Payer: Self-pay | Admitting: Podiatry

## 2020-11-10 NOTE — Telephone Encounter (Signed)
Patient would like to know the results of her Ultrasound, feels like shes walking on rocks

## 2020-11-10 NOTE — Telephone Encounter (Signed)
Patient would like to know the results or her ultrasound, she feels like she is walking on rocks.

## 2020-11-12 ENCOUNTER — Other Ambulatory Visit: Payer: Self-pay

## 2020-11-12 DIAGNOSIS — E785 Hyperlipidemia, unspecified: Secondary | ICD-10-CM

## 2020-11-12 DIAGNOSIS — E119 Type 2 diabetes mellitus without complications: Secondary | ICD-10-CM

## 2020-11-12 DIAGNOSIS — J42 Unspecified chronic bronchitis: Secondary | ICD-10-CM

## 2020-11-12 MED ORDER — IPRATROPIUM BROMIDE 0.03 % NA SOLN: 2.0000 | Freq: Two times a day (BID) | NASAL | 3 refills | Status: DC

## 2020-11-12 MED ORDER — SIMVASTATIN 20 MG PO TABS: ORAL_TABLET | ORAL | 3 refills | Status: DC

## 2020-11-16 ENCOUNTER — Ambulatory Visit (INDEPENDENT_AMBULATORY_CARE_PROVIDER_SITE_OTHER): Payer: Medicare HMO

## 2020-11-16 ENCOUNTER — Encounter: Payer: Self-pay | Admitting: Podiatry

## 2020-11-16 ENCOUNTER — Other Ambulatory Visit: Payer: Self-pay

## 2020-11-16 ENCOUNTER — Ambulatory Visit: Payer: Medicare HMO | Admitting: Podiatry

## 2020-11-16 DIAGNOSIS — M79671 Pain in right foot: Secondary | ICD-10-CM

## 2020-11-16 DIAGNOSIS — S90851A Superficial foreign body, right foot, initial encounter: Secondary | ICD-10-CM

## 2020-11-16 DIAGNOSIS — L923 Foreign body granuloma of the skin and subcutaneous tissue: Secondary | ICD-10-CM

## 2020-11-16 DIAGNOSIS — S90851D Superficial foreign body, right foot, subsequent encounter: Secondary | ICD-10-CM

## 2020-11-17 ENCOUNTER — Other Ambulatory Visit: Payer: Self-pay | Admitting: Podiatry

## 2020-11-17 DIAGNOSIS — S90851A Superficial foreign body, right foot, initial encounter: Secondary | ICD-10-CM

## 2020-11-19 NOTE — Progress Notes (Signed)
Subjective: [name] is a 80 y.o. is seen today in office s/p right foot foreign body excision preformed on 08/05/2020.  She states that she still getting discomfort she points to a specific area in the bottom of her right foot that hurts with walking.  This is on the area of a thicker callus.  She denies any swelling or redness or any drainage currently.  She thinks that 1 day she may have had some swelling but it dissipated quickly.  She has not had any fever or chills.  She has no other concerns.   Objective: General: No acute distress, AAOx3  DP/PT pulses palpable 2/4, CRT < 3 sec to all digits.  Protective sensation intact. Motor function intact.  RIght foot: Hyperkeratotic tissue on the area the incision plantarly.  See note below but there was no ongoing ulceration or signs of infection.  There is no area of fluctuation crepitation.  There is no malodor.  No increase in warmth of the foot. No pain with calf compression, erythema or warmth.  Assessment and Plan:  Status post right foot foreign body excision however with continued pain  -Treatment options discussed including all alternatives, risks, and complications -Repeat x-rays obtained reviewed.  No evidence of foreign body or osteomyelitis. -I again reviewed the ultrasound with her.  There is 1 particular area causing discomfort and slight area of a thickened callus.  To further evaluate this and to continue to rest ice the area second medically debride this.  She was proceed with this.  I cleaned the skin with alcohol and 2 cc of lidocaine/Marcaine plain was infiltrated in a regional block fashion.  Once anesthetized I cleaned the skin with alcohol I sharply debrided the hyperkeratotic lesion with a #15 blade scalpel.  I was able to remove the central core but there was no open lesion identified.  There is no ulceration or any drainage.  At this point given the ultrasound I want to make sure there is no fluid collection.  Utilizing  18-gauge needle I introduced this to the plantar aspect of the foot adjacent to the skin lesion.  I was not able to remove any fluid.  Area was cleaned and a bandage applied.  Post procedure instructions discussed.  Tolerated well. -Offloading pads dispensed  [name] DPM

## 2020-11-26 ENCOUNTER — Other Ambulatory Visit: Payer: Self-pay | Admitting: Family Medicine

## 2020-11-26 DIAGNOSIS — E119 Type 2 diabetes mellitus without complications: Secondary | ICD-10-CM

## 2020-11-26 DIAGNOSIS — Z794 Long term (current) use of insulin: Secondary | ICD-10-CM

## 2020-11-27 ENCOUNTER — Other Ambulatory Visit: Payer: Self-pay

## 2020-11-27 DIAGNOSIS — E119 Type 2 diabetes mellitus without complications: Secondary | ICD-10-CM

## 2020-11-27 DIAGNOSIS — Z794 Long term (current) use of insulin: Secondary | ICD-10-CM

## 2020-11-27 MED ORDER — BD SWAB SINGLE USE REGULAR PADS: MEDICATED_PAD | 3 refills | Status: DC

## 2020-11-27 MED ORDER — ACCU-CHEK GUIDE VI STRP: ORAL_STRIP | 3 refills | Status: DC

## 2020-11-27 MED ORDER — ACCU-CHEK SOFTCLIX LANCETS MISC: 3 refills | Status: DC

## 2020-11-27 MED ORDER — ACCU-CHEK GUIDE W/DEVICE KIT: 1.0000 | PACK | Freq: Four times a day (QID) | 0 refills | Status: DC

## 2020-11-27 NOTE — Telephone Encounter (Signed)
Pt is calling she is down to two days of insulin and she has paid [employer] money to get meds sent quicker. Pt is wanting to know what she needs to do until she can get more insuline?   [place], [address]  [place], [place] [address]   insulin aspart (NOVOLOG FLEXPEN) 100 UNIT/ML FlexPen [[id number]]    Pt call back [phone]

## 2020-11-29 ENCOUNTER — Telehealth: Payer: Self-pay | Admitting: Registered Nurse

## 2020-11-29 DIAGNOSIS — E119 Type 2 diabetes mellitus without complications: Secondary | ICD-10-CM

## 2020-11-30 NOTE — Telephone Encounter (Signed)
Patient is completley out of medication can we please send this in ASAP

## 2020-11-30 NOTE — Telephone Encounter (Signed)
Thank you Rich!

## 2020-11-30 NOTE — Telephone Encounter (Signed)
Have been sent  Thanks,  Denice Paradise

## 2020-12-01 ENCOUNTER — Other Ambulatory Visit: Payer: Self-pay | Admitting: Family Medicine

## 2020-12-01 DIAGNOSIS — Z794 Long term (current) use of insulin: Secondary | ICD-10-CM

## 2020-12-01 DIAGNOSIS — E119 Type 2 diabetes mellitus without complications: Secondary | ICD-10-CM

## 2020-12-01 NOTE — Telephone Encounter (Signed)
Rx was sent in on 11/30/20

## 2020-12-01 NOTE — Telephone Encounter (Signed)
RxLevimir flextouch -already filled and sent to the pharmacy

## 2020-12-02 ENCOUNTER — Encounter: Payer: Self-pay | Admitting: Registered Nurse

## 2020-12-02 ENCOUNTER — Ambulatory Visit (INDEPENDENT_AMBULATORY_CARE_PROVIDER_SITE_OTHER): Payer: Medicare HMO | Admitting: Registered Nurse

## 2020-12-02 ENCOUNTER — Other Ambulatory Visit: Payer: Self-pay

## 2020-12-02 VITALS — BP 98/51 | HR 68 | Temp 98.1°F | Resp 18 | Ht 66.0 in | Wt 149.4 lb

## 2020-12-02 DIAGNOSIS — R059 Cough, unspecified: Secondary | ICD-10-CM

## 2020-12-02 DIAGNOSIS — E119 Type 2 diabetes mellitus without complications: Secondary | ICD-10-CM

## 2020-12-02 DIAGNOSIS — E039 Hypothyroidism, unspecified: Secondary | ICD-10-CM

## 2020-12-02 DIAGNOSIS — I5022 Chronic systolic (congestive) heart failure: Secondary | ICD-10-CM

## 2020-12-02 DIAGNOSIS — Z23 Encounter for immunization: Secondary | ICD-10-CM | POA: Diagnosis not present

## 2020-12-02 DIAGNOSIS — I2510 Atherosclerotic heart disease of native coronary artery without angina pectoris: Secondary | ICD-10-CM

## 2020-12-02 DIAGNOSIS — I10 Essential (primary) hypertension: Secondary | ICD-10-CM

## 2020-12-02 DIAGNOSIS — G479 Sleep disorder, unspecified: Secondary | ICD-10-CM | POA: Diagnosis not present

## 2020-12-02 DIAGNOSIS — R202 Paresthesia of skin: Secondary | ICD-10-CM | POA: Diagnosis not present

## 2020-12-02 DIAGNOSIS — K2100 Gastro-esophageal reflux disease with esophagitis, without bleeding: Secondary | ICD-10-CM

## 2020-12-02 DIAGNOSIS — E785 Hyperlipidemia, unspecified: Secondary | ICD-10-CM

## 2020-12-02 DIAGNOSIS — Z794 Long term (current) use of insulin: Secondary | ICD-10-CM

## 2020-12-02 LAB — CBC WITH DIFFERENTIAL/PLATELET
Basophils Absolute: 0.1 10*3/uL (ref 0.0–0.1)
Basophils Relative: 0.8 % (ref 0.0–3.0)
Eosinophils Absolute: 0.3 10*3/uL (ref 0.0–0.7)
Eosinophils Relative: 3.5 % (ref 0.0–5.0)
HCT: 44.9 % (ref 36.0–46.0)
Hemoglobin: 15 g/dL (ref 12.0–15.0)
Lymphocytes Relative: 17.7 % (ref 12.0–46.0)
Lymphs Abs: 1.5 10*3/uL (ref 0.7–4.0)
MCHC: 33.3 g/dL (ref 30.0–36.0)
MCV: 94.5 fl (ref 78.0–100.0)
Monocytes Absolute: 0.6 10*3/uL (ref 0.1–1.0)
Monocytes Relative: 7.3 % (ref 3.0–12.0)
Neutro Abs: 6.1 10*3/uL (ref 1.4–7.7)
Neutrophils Relative %: 70.7 % (ref 43.0–77.0)
Platelets: 202 10*3/uL (ref 150.0–400.0)
RBC: 4.76 Mil/uL (ref 3.87–5.11)
RDW: 14.1 % (ref 11.5–15.5)
WBC: 8.6 10*3/uL (ref 4.0–10.5)

## 2020-12-02 LAB — LIPID PANEL
Cholesterol: 143 mg/dL (ref 0–200)
HDL: 57.9 mg/dL (ref >39.00)
LDL Cholesterol: 66 mg/dL (ref 0–99)
NonHDL: 84.83
Total CHOL/HDL Ratio: 2
Triglycerides: 94 mg/dL (ref 0.0–149.0)
VLDL: 18.8 mg/dL (ref 0.0–40.0)

## 2020-12-02 LAB — COMPREHENSIVE METABOLIC PANEL
ALT: 11 U/L (ref 0–35)
AST: 15 U/L (ref 0–37)
Albumin: 4.4 g/dL (ref 3.5–5.2)
Alkaline Phosphatase: 70 U/L (ref 39–117)
BUN: 15 mg/dL (ref 6–23)
CO2: 32 mEq/L (ref 19–32)
Calcium: 9.8 mg/dL (ref 8.4–10.5)
Chloride: 99 mEq/L (ref 96–112)
Creatinine, Ser: 1.2 mg/dL (ref 0.40–1.20)
GFR: 43.18 mL/min — ABNORMAL LOW (ref >60.00)
Glucose, Bld: 126 mg/dL — ABNORMAL HIGH (ref 70–99)
Potassium: 3.9 mEq/L (ref 3.5–5.1)
Sodium: 141 mEq/L (ref 135–145)
Total Bilirubin: 0.6 mg/dL (ref 0.2–1.2)
Total Protein: 7.1 g/dL (ref 6.0–8.3)

## 2020-12-02 LAB — GLUCOSE, POCT (MANUAL RESULT ENTRY): POC Glucose: 116 mg/dl — AB (ref 70–99)

## 2020-12-02 LAB — TSH: TSH: 2.74 u[IU]/mL (ref 0.35–5.50)

## 2020-12-02 LAB — HEMOGLOBIN A1C: Hgb A1c MFr Bld: 6.4 % (ref 4.6–6.5)

## 2020-12-02 MED ORDER — NOVOLOG FLEXPEN 100 UNIT/ML ~~LOC~~ SOPN: PEN_INJECTOR | SUBCUTANEOUS | 11 refills | Status: DC

## 2020-12-02 MED ORDER — TRAZODONE HCL 100 MG PO TABS: 100.0000 mg | ORAL_TABLET | Freq: Every day | ORAL | 3 refills | Status: DC

## 2020-12-02 NOTE — Progress Notes (Signed)
Established Patient Office Visit  Subjective:  Patient ID: [name], female    DOB: Aug 30, 1940  Age: 80 y.o. MRN: [id number]  CC:  Chief Complaint  Patient presents with   Medication Refill    Patient states she needs a medication refill.    HPI [name] presents for med refill  No acute concerns.  She has run out of her insulin. She has been without it for a few days. Continues to check sugars. Usually 110-120. No symptoms of note. Diet has been steady - admits she gives into sugar cravings. Fortunately her A1c has been steady within control for some time.  Is interested in sleep aid. Has done well with trazodone. Plan to refill.  Flu shot - interested today - no AE in past.  Would like screening labs.  BP low on arrival. Pt denies symptoms. Would like to maintain regimen.   Past Medical History:  Diagnosis Date   Anxiety    Arthritis    Chronic pain    a. Prior h/o chronic pain on methadone.   Chronic systolic CHF (congestive heart failure) (HCC)    a. mixed ischemic/non-ischemic cardiomyopathy. b. s/p CRT-D in 06/2014.   CKD (chronic kidney disease), stage III (HCC)    Complication of anesthesia    hard time waking her up from general surgery   Coronary artery disease    a. remote RCA stenting in 2008 with non-DES. b. Cath 06/2014 following abnormal nuc: Stable, unchanged from prior cath, patent stent and 40% LM.   Diabetes mellitus (HCC)    GERD (gastroesophageal reflux disease)    Headache    Hyperlipidemia    Hypertension    LBBB (left bundle branch block)    Nonischemic cardiomyopathy (HCC)    OSA (obstructive sleep apnea)    AHI-9.77/hr, during REM-50.32/hr   Tobacco abuse     Past Surgical History:  Procedure Laterality Date   BACK SURGERY  2013   BI-VENTRICULAR IMPLANTABLE CARDIOVERTER DEFIBRILLATOR N/A 06/11/2014   STJ CRTD implanted by Dr Caryl Comes   CARDIAC CATHETERIZATION  12/14/2006   RCA stented with a 3.0 [employer] Liberte stent  resulting in a reduction of 75% to 0% residual   CHOLECYSTECTOMY     20 years ago   COLONOSCOPY WITH PROPOFOL N/A 12/15/2014   Procedure: COLONOSCOPY WITH PROPOFOL;  Surgeon: Clarene Essex, MD;  Location: [facility];  Service: Endoscopy;  Laterality: N/A;   EYE SURGERY     bilateral cataract surgery    LEFT AND RIGHT HEART CATHETERIZATION WITH CORONARY ANGIOGRAM N/A 05/29/2014   Procedure: LEFT AND RIGHT HEART CATHETERIZATION WITH CORONARY ANGIOGRAM;  Surgeon: Lorretta Harp, MD;  Location: [facility] CATH LAB;  Service: Cardiovascular;  Laterality: N/A;   LEFT HEART CATHETERIZATION WITH CORONARY ANGIOGRAM N/A 12/27/2012   Procedure: LEFT HEART CATHETERIZATION WITH CORONARY ANGIOGRAM;  Surgeon: Lorretta Harp, MD;  Location: [facility] CATH LAB;  Service: Cardiovascular;  Laterality: N/A;    Family History  Problem Relation Age of Onset   Stroke Mother    Hypertension Mother    Coronary artery disease Father    Stroke Brother    Heart disease Brother    Other Brother        H1N1 VIRUS   Healthy Sister    Healthy Sister     Social History   Socioeconomic History   Marital status: Widowed    Spouse name: Not on file   Number of children: Not on file   Years  of education: Not on file   Highest education level: Not on file  Occupational History   Not on file  Tobacco Use   Smoking status: Every Day    Packs/day: 2.00    Years: 58.00    Pack years: 116.00    Types: Cigarettes    Start date: 12/26/1952    Last attempt to quit: 12/05/2013    Years since quitting: 6.9   Smokeless tobacco: Never   Tobacco comments:    uses Vape cigarettes  Vaping Use   Vaping Use: Never used  Substance and Sexual Activity   Alcohol use: No    Alcohol/week: 0.0 standard drinks   Drug use: No   Sexual activity: Not on file  Other Topics Concern   Not on file  Social History Narrative   Patient lives in [place] with his husband and grandson.   Son completed suicide on September 17th of this year,  patient found him.   Social Determinants of Health   Financial Resource Strain: Not on file  Food Insecurity: Not on file  Transportation Needs: Not on file  Physical Activity: Not on file  Stress: Not on file  Social Connections: Not on file  Intimate Partner Violence: Not on file    Outpatient Medications Prior to Visit  Medication Sig Dispense Refill   albuterol (VENTOLIN HFA) 108 (90 Base) MCG/ACT inhaler Inhale 2 puffs into the lungs every 6 (six) hours as needed for wheezing or shortness of breath. 8 g 0   Alcohol Swabs (B-D SINGLE USE SWABS REGULAR) PADS Use 1-4 times daily as directed.  DX E11.9 400 each 3   ammonium lactate (AMLACTIN) 12 % lotion Apply 1 application topically as needed for dry skin. 400 g 0   ascorbic acid (VITAMIN C) 500 MG tablet Take 500 mg by mouth daily.     aspirin EC 81 MG tablet Take 81 mg by mouth at bedtime.     blood glucose meter kit and supplies KIT Dispense based on patient and insurance preference. Use three times a day as directed. Dx. E11.65, Z79.4 1 each 11   blood glucose meter kit and supplies Dispense based on patient and insurance preference. Use up to four times daily as directed. (FOR ICD-10 E10.9, E11.9). 1 each 0   Blood Glucose Monitoring Suppl (ACCU-CHEK GUIDE) w/Device KIT 1 Device by Does not apply route 4 (four) times daily. 1 kit 0   carvedilol (COREG) 12.5 MG tablet TAKE 1 1/2 TABLET BY MOUTH TWICE A DAY 270 tablet 2   clopidogrel (PLAVIX) 75 MG tablet Take 1 tablet (75 mg total) by mouth daily. 90 tablet 1   colchicine 0.6 MG tablet Take 1 tablet (0.6 mg total) by mouth daily. 7 tablet 0   furosemide (LASIX) 40 MG tablet TAKE 2 TABLETS ONE TIME DAILY IN THE MORNING AND TAKE 1 TABLET EVERY EVENING 161 tablet 1   Garlic 096 MG CAPS Take 500 mg by mouth daily.     glucose blood (ACCU-CHEK GUIDE) test strip Use 1-4 times daily as directed.  DX E11.9 400 each 3   insulin aspart (NOVOLOG FLEXPEN) 100 UNIT/ML FlexPen Per insulin  sliding scale, max TTD, 20 units. (Patient taking differently: Inject 2-3 Units into the skin daily as needed (blood sugar over 180).) 15 mL 11   Insulin Pen Needle (BD PEN NEEDLE MICRO U/F) 32G X 6 MM MISC USE NEW NEEDLE FOR EACH INJECTION OF INSULIN, FOUR TIMES DAILY 100 each 5   ipratropium (  ATROVENT) 0.03 % nasal spray Place 2 sprays into both nostrils 2 (two) times daily. 30 mL 3   ketoconazole (NIZORAL) 2 % cream Apply 1 application topically daily. 60 g 0   LEVEMIR FLEXTOUCH 100 UNIT/ML FlexTouch Pen INJECT 16 UNITS SUBCUTANEOUSLY ONCE DAILY AT  10  PM 15 mL 0   levothyroxine (SYNTHROID) 88 MCG tablet TAKE 1 TABLET AT BEDTIME 90 tablet 0   mupirocin ointment (BACTROBAN) 2 % Apply 1 application topically 3 (three) times daily. 22 g 0   nitroGLYCERIN (NITROSTAT) 0.4 MG SL tablet Place 1 tablet (0.4 mg total) under the tongue every 5 (five) minutes as needed for chest pain. 25 tablet 3   NONFORMULARY OR COMPOUNDED ITEM Pain cream : ketamine 5%, baclofen 2%, gabapentin 5%, lidocaine 5%, menthol 1% Order faxed to [facility]     ondansetron (ZOFRAN) 4 MG tablet Take 1 tablet (4 mg total) by mouth every 8 (eight) hours as needed for nausea or vomiting. 20 tablet 0   oxyCODONE-acetaminophen (PERCOCET/ROXICET) 5-325 MG tablet Take 1 tablet by mouth 2 (two) times daily as needed for severe pain.     pantoprazole (PROTONIX) 40 MG tablet Take 1 tablet (40 mg total) by mouth daily. (Patient taking differently: Take 40 mg by mouth at bedtime.) 30 tablet 3   potassium chloride SA (KLOR-CON) 20 MEQ tablet TAKE 1 TAB BY MOUTH 2 TIMES A DAY ON Monday, Wednesday AND Friday  (days you take Lasix) 180 tablet 1   pyridOXINE (VITAMIN B-6) 100 MG tablet Take 100 mg by mouth at bedtime.     rOPINIRole (REQUIP) 0.5 MG tablet TAKE 1 TABLET AT BEDTIME 90 tablet 0   simvastatin (ZOCOR) 20 MG tablet TAKE 1 TABLET BY MOUTH ONCE DAILY WITH SUPPER 90 tablet 3   traZODone (DESYREL) 100 MG tablet Take 1 tablet (100 mg  total) by mouth at bedtime. 90 tablet 3   triamcinolone cream (KENALOG) 0.1 % APPLY ONE APPLICATION TOPICALLY TWO TIMES DAILY (Patient taking differently: Apply 1 application topically 2 (two) times daily.) 30 g 0   Cholecalciferol (DIALYVITE VITAMIN D 5000) 125 MCG (5000 UT) capsule Take 5,000 Units by mouth daily.     traZODone (DESYREL) 50 MG tablet Take 1-2 tablets (50-100 mg total) by mouth at bedtime. 180 tablet 3   Accu-Chek Softclix Lancets lancets Use 1-4 times daily as directed.  DX E11.9 360 each 3   benzonatate (TESSALON) 200 MG capsule TAKE 1 CAPSULE BY MOUTH TWICE DAILY AS NEEDED FOR COUGH 20 capsule 0   doxepin (SINEQUAN) 10 MG capsule      cephALEXin (KEFLEX) 500 MG capsule Take 1 capsule (500 mg total) by mouth 2 (two) times daily. (Patient not taking: Reported on 12/02/2020) 14 capsule 0   diclofenac Sodium (VOLTAREN) 1 % GEL Apply 1 application topically 4 (four) times daily as needed (pain). (Patient not taking: Reported on 12/02/2020)     doxycycline (VIBRA-TABS) 100 MG tablet Take 1 tablet (100 mg total) by mouth 2 (two) times daily. (Patient not taking: Reported on 12/02/2020) 14 tablet 0   HYDROcodone-acetaminophen (NORCO/VICODIN) 5-325 MG tablet Take 1 tablet by mouth every 4 (four) hours as needed. (Patient not taking: Reported on 12/02/2020) 15 tablet 0   predniSONE (DELTASONE) 10 MG tablet Take 1 tablet (10 mg total) by mouth daily with breakfast. (Patient not taking: Reported on 12/02/2020) 6 tablet 0   No facility-administered medications prior to visit.    Allergies  Allergen Reactions   Valium [Diazepam] Swelling  face    ROS Review of Systems  Constitutional: Negative.   HENT: Negative.    Eyes: Negative.   Respiratory: Negative.    Cardiovascular: Negative.   Gastrointestinal: Negative.   Genitourinary: Negative.   Musculoskeletal: Negative.   Skin: Negative.   Neurological: Negative.   Psychiatric/Behavioral: Negative.    All other systems reviewed  and are negative.    Objective:    Physical Exam Vitals and nursing note reviewed.  Constitutional:      General: She is not in acute distress.    Appearance: Normal appearance. She is normal weight. She is not ill-appearing, toxic-appearing or diaphoretic.  Cardiovascular:     Rate and Rhythm: Normal rate and regular rhythm.     Heart sounds: Normal heart sounds. No murmur heard.   No friction rub. No gallop.  Pulmonary:     Effort: Pulmonary effort is normal. No respiratory distress.     Breath sounds: Normal breath sounds. No stridor. No wheezing, rhonchi or rales.  Chest:     Chest wall: No tenderness.  Skin:    General: Skin is warm and dry.  Neurological:     General: No focal deficit present.     Mental Status: She is alert and oriented to person, place, and time. Mental status is at baseline.  Psychiatric:        Mood and Affect: Mood normal.        Behavior: Behavior normal.        Thought Content: Thought content normal.        Judgment: Judgment normal.    BP (!) 98/51   Pulse 68   Temp 98.1 F (36.7 C) (Temporal)   Resp 18   Ht '5\' 6"'  (1.676 m)   Wt 149 lb 6.4 oz (67.8 kg)   SpO2 98%   BMI 24.11 kg/m  Wt Readings from Last 3 Encounters:  12/02/20 149 lb 6.4 oz (67.8 kg)  07/27/20 148 lb 9.6 oz (67.4 kg)  05/13/20 147 lb 9.6 oz (67 kg)     Health Maintenance Due  Topic Date Due   COVID-19 Vaccine (3 - Pfizer risk series) 05/20/2019    There are no preventive care reminders to display for this patient.  Lab Results  Component Value Date   TSH 2.47 07/27/2020   Lab Results  Component Value Date   WBC 7.8 07/27/2020   HGB 14.2 07/27/2020   HCT 40.9 07/27/2020   MCV 93.5 07/27/2020   PLT 244.0 07/27/2020   Lab Results  Component Value Date   NA 139 01/27/2020   K 4.4 01/27/2020   CO2 28 01/27/2020   GLUCOSE 118 (H) 01/27/2020   BUN 17 01/27/2020   CREATININE 1.13 (H) 01/27/2020   BILITOT 0.4 10/11/2019   ALKPHOS 78 10/11/2019   AST 15  10/11/2019   ALT 11 10/11/2019   PROT 6.3 10/11/2019   ALBUMIN 4.4 10/11/2019   CALCIUM 9.8 01/27/2020   ANIONGAP 12 08/31/2016   GFR 35.10 (L) 10/06/2014   Lab Results  Component Value Date   CHOL 150 10/11/2019   Lab Results  Component Value Date   HDL 53 10/11/2019   Lab Results  Component Value Date   LDLCALC 78 10/11/2019   Lab Results  Component Value Date   TRIG 107 10/11/2019   Lab Results  Component Value Date   CHOLHDL 2.8 10/11/2019   Lab Results  Component Value Date   HGBA1C 6.1 07/27/2020      Assessment &  Plan:   Problem List Items Addressed This Visit       Cardiovascular and Mediastinum   Essential hypertension   Chronic systolic CHF (congestive heart failure) (HCC)     Digestive   Esophagitis, reflux     Endocrine   Hypothyroidism   Relevant Orders   TSH (Completed)   Insulin dependent type 2 diabetes mellitus (HCC)   Relevant Medications   insulin aspart (NOVOLOG FLEXPEN) 100 UNIT/ML FlexPen   Other Relevant Orders   POCT glucose (manual entry) (Completed)   Comprehensive metabolic panel (Completed)   CBC with Differential/Platelet (Completed)   Hemoglobin A1c (Completed)     Other   Dyslipidemia, goal LDL below 70   Relevant Orders   Lipid panel (Completed)   Cough   Other Visit Diagnoses     Flu vaccine need    -  Primary   Relevant Orders   Flu Vaccine QUAD High Dose(Fluad) (Completed)   Paresthesia of both feet       Coronary artery disease involving native coronary artery of native heart without angina pectoris       Sleep disturbance       Relevant Medications   traZODone (DESYREL) 100 MG tablet       No orders of the defined types were placed in this encounter.   Follow-up: No follow-ups on file.   PLAN Refill insulin. She is on low dose and will likely not need more based on her sugar today at 116. Discussed CV benefits to maintaining this dose regardless. Discussed benefits vs. Risk of hypoglycemia, she  is aware of symptoms, checks sugars regularly, and has plan for hypo and hyperglycemia. Refill trazodone 117m po qhs PRN Flu vaccine given Labs collected. Will follow up with the patient as warranted. Patient encouraged to call clinic with any questions, comments, or concerns.  RMaximiano Coss NP

## 2020-12-02 NOTE — Patient Instructions (Addendum)
[name] -  Always a pleasure.  Refilled trazodone 100mg  tabs ([facility])  Refilled insulin (mail pharmacy)  Labs today will be back this afternoon   I'll call with concerns  See you in 6 months, sooner if you need anything  Thank you  Rich     If you have lab work done today you will be contacted with your lab results within the next 2 weeks.  If you have not heard from [place] then please contact us. The fastest way to get your results is to register for My Chart.   IF you received an x-ray today, you will receive an invoice from [facility]. Please contact [facility] at [phone] with questions or concerns regarding your invoice.   IF you received labwork today, you will receive an invoice from [place]. Please contact [facility] at [phone] with questions or concerns regarding your invoice.   Our billing staff will not be able to assist you with questions regarding bills from these companies.  You will be contacted with the lab results as soon as they are available. The fastest way to get your results is to activate your My Chart account. Instructions are located on the last page of this paperwork. If you have not heard from [place] regarding the results in 2 weeks, please contact this office.

## 2020-12-07 ENCOUNTER — Ambulatory Visit (INDEPENDENT_AMBULATORY_CARE_PROVIDER_SITE_OTHER): Payer: Medicare HMO

## 2020-12-07 DIAGNOSIS — I5022 Chronic systolic (congestive) heart failure: Secondary | ICD-10-CM | POA: Diagnosis not present

## 2020-12-07 DIAGNOSIS — Z95810 Presence of automatic (implantable) cardiac defibrillator: Secondary | ICD-10-CM | POA: Diagnosis not present

## 2020-12-08 ENCOUNTER — Ambulatory Visit: Payer: Medicare HMO | Admitting: Internal Medicine

## 2020-12-08 ENCOUNTER — Other Ambulatory Visit: Payer: Self-pay

## 2020-12-08 ENCOUNTER — Encounter: Payer: Self-pay | Admitting: Internal Medicine

## 2020-12-08 VITALS — BP 96/58 | HR 75 | Ht 65.0 in | Wt 147.0 lb

## 2020-12-08 DIAGNOSIS — I5022 Chronic systolic (congestive) heart failure: Secondary | ICD-10-CM

## 2020-12-08 DIAGNOSIS — I428 Other cardiomyopathies: Secondary | ICD-10-CM | POA: Diagnosis not present

## 2020-12-08 DIAGNOSIS — I5032 Chronic diastolic (congestive) heart failure: Secondary | ICD-10-CM | POA: Diagnosis not present

## 2020-12-08 DIAGNOSIS — E1122 Type 2 diabetes mellitus with diabetic chronic kidney disease: Secondary | ICD-10-CM | POA: Diagnosis not present

## 2020-12-08 DIAGNOSIS — J449 Chronic obstructive pulmonary disease, unspecified: Secondary | ICD-10-CM | POA: Diagnosis not present

## 2020-12-08 DIAGNOSIS — E1142 Type 2 diabetes mellitus with diabetic polyneuropathy: Secondary | ICD-10-CM | POA: Diagnosis not present

## 2020-12-08 LAB — CUP PACEART INCLINIC DEVICE CHECK
Battery Remaining Longevity: 22 mo
Brady Statistic RA Percent Paced: 11 %
Brady Statistic RV Percent Paced: 99.82 %
Date Time Interrogation Session: 20221004164239
HighPow Impedance: 74.25 Ohm
Implantable Lead Implant Date: 20160406
Implantable Lead Implant Date: 20160406
Implantable Lead Implant Date: 20160406
Implantable Lead Location: 753858
Implantable Lead Location: 753859
Implantable Lead Location: 753860
Implantable Lead Model: 7122
Implantable Pulse Generator Implant Date: 20160406
Lead Channel Impedance Value: 1412.5 Ohm
Lead Channel Impedance Value: 375 Ohm
Lead Channel Impedance Value: 687.5 Ohm
Lead Channel Pacing Threshold Amplitude: 0.75 V
Lead Channel Pacing Threshold Amplitude: 0.75 V
Lead Channel Pacing Threshold Amplitude: 0.75 V
Lead Channel Pacing Threshold Amplitude: 0.75 V
Lead Channel Pacing Threshold Amplitude: 1 V
Lead Channel Pacing Threshold Amplitude: 1 V
Lead Channel Pacing Threshold Pulse Width: 0.5 ms
Lead Channel Pacing Threshold Pulse Width: 0.5 ms
Lead Channel Pacing Threshold Pulse Width: 0.5 ms
Lead Channel Pacing Threshold Pulse Width: 0.5 ms
Lead Channel Pacing Threshold Pulse Width: 0.5 ms
Lead Channel Pacing Threshold Pulse Width: 0.5 ms
Lead Channel Sensing Intrinsic Amplitude: 12 mV
Lead Channel Sensing Intrinsic Amplitude: 5 mV
Lead Channel Setting Pacing Amplitude: 1.875
Lead Channel Setting Pacing Amplitude: 2 V
Lead Channel Setting Pacing Amplitude: 2 V
Lead Channel Setting Pacing Pulse Width: 0.5 ms
Lead Channel Setting Pacing Pulse Width: 0.5 ms
Lead Channel Setting Sensing Sensitivity: 0.5 mV
Pulse Gen Serial Number: 7199559

## 2020-12-08 NOTE — Patient Instructions (Signed)

## 2020-12-08 NOTE — Progress Notes (Signed)
Patient Care Team: Maximiano Coss, NP as PCP - General (Adult Health Nurse Practitioner) Lorretta Harp, MD as PCP - Cardiology (Cardiology) Lorretta Harp, MD as Consulting Physician (Cardiology) Chucky May, MD as Consulting Physician (Psychiatry) Kristeen Miss, MD as Consulting Physician (Neurosurgery)   HPI  [name] is a 80 y.o. female Seen in follow-up for CRT- ICD implanted 4/16 for ischemic cardiomyopathy with prior stenting.  She has a history of congestive heart failure and left bundle branch block. Near normalization of LV function Prior syncope.  The patient denies chest pain, shortness of breath, nocturnal dyspnea, orthopnea or peripheral edema.  There have been no palpitations, lightheadedness or syncope.      DATE TEST EF   4/16 Echo   20-25 %   8/17 Echo   50-55 %         Date Cr K TSH  2/19 1.28 4.6  8.440   6/20 1.19 3.8 1.64  2022-12-08 1.20 3.9 2.74   Her son died a year or so ago and her husband died in 2022/07/14.  She is extremely remorseful about the circumstances of his dying, having been convinced to send him to a nursing home for some days.  Thankfully she was able to get him home for the last 2 days before he died.  She was married at the age of 80.     Past Medical History:  Diagnosis Date   Anxiety    Arthritis    Chronic pain    a. Prior h/o chronic pain on methadone.   Chronic systolic CHF (congestive heart failure) (HCC)    a. mixed ischemic/non-ischemic cardiomyopathy. b. s/p CRT-D in Jul 14, 2014.   CKD (chronic kidney disease), stage III (HCC)    Complication of anesthesia    hard time waking her up from general surgery   Coronary artery disease    a. remote RCA stenting in 2008 with non-DES. b. Cath 2014-07-14 following abnormal nuc: Stable, unchanged from prior cath, patent stent and 40% LM.   Diabetes mellitus (HCC)    GERD (gastroesophageal reflux disease)    Headache    Hyperlipidemia    Hypertension    LBBB (left bundle  branch block)    Nonischemic cardiomyopathy (HCC)    OSA (obstructive sleep apnea)    AHI-9.77/hr, during REM-50.32/hr   Tobacco abuse     Past Surgical History:  Procedure Laterality Date   BACK SURGERY  2013   BI-VENTRICULAR IMPLANTABLE CARDIOVERTER DEFIBRILLATOR N/A 06/11/2014   STJ CRTD implanted by Dr Caryl Comes   CARDIAC CATHETERIZATION  12/14/2006   RCA stented with a 3.0 [employer] Liberte stent resulting in a reduction of 75% to 0% residual   CHOLECYSTECTOMY     20 years ago   COLONOSCOPY WITH PROPOFOL N/A 12/15/2014   Procedure: COLONOSCOPY WITH PROPOFOL;  Surgeon: Clarene Essex, MD;  Location: [facility];  Service: Endoscopy;  Laterality: N/A;   EYE SURGERY     bilateral cataract surgery    LEFT AND RIGHT HEART CATHETERIZATION WITH CORONARY ANGIOGRAM N/A 05/29/2014   Procedure: LEFT AND RIGHT HEART CATHETERIZATION WITH CORONARY ANGIOGRAM;  Surgeon: Lorretta Harp, MD;  Location: [facility] CATH LAB;  Service: Cardiovascular;  Laterality: N/A;   LEFT HEART CATHETERIZATION WITH CORONARY ANGIOGRAM N/A 12/27/2012   Procedure: LEFT HEART CATHETERIZATION WITH CORONARY ANGIOGRAM;  Surgeon: Lorretta Harp, MD;  Location: [facility] CATH LAB;  Service: Cardiovascular;  Laterality: N/A;    Current Outpatient Medications  Medication Sig  Dispense Refill   Accu-Chek Softclix Lancets lancets Use 1-4 times daily as directed.  DX E11.9 360 each 3   albuterol (VENTOLIN HFA) 108 (90 Base) MCG/ACT inhaler Inhale 2 puffs into the lungs every 6 (six) hours as needed for wheezing or shortness of breath. 8 g 0   Alcohol Swabs (B-D SINGLE USE SWABS REGULAR) PADS Use 1-4 times daily as directed.  DX E11.9 400 each 3   ammonium lactate (AMLACTIN) 12 % lotion Apply 1 application topically as needed for dry skin. 400 g 0   ascorbic acid (VITAMIN C) 500 MG tablet Take 500 mg by mouth daily.     aspirin EC 81 MG tablet Take 81 mg by mouth at bedtime.     blood glucose meter kit and supplies KIT Dispense based on  patient and insurance preference. Use three times a day as directed. Dx. E11.65, Z79.4 1 each 11   blood glucose meter kit and supplies Dispense based on patient and insurance preference. Use up to four times daily as directed. (FOR ICD-10 E10.9, E11.9). 1 each 0   Blood Glucose Monitoring Suppl (ACCU-CHEK GUIDE) w/Device KIT 1 Device by Does not apply route 4 (four) times daily. 1 kit 0   clopidogrel (PLAVIX) 75 MG tablet Take 1 tablet (75 mg total) by mouth daily. 90 tablet 1   furosemide (LASIX) 40 MG tablet TAKE 2 TABLETS ONE TIME DAILY IN THE MORNING AND TAKE 1 TABLET EVERY EVENING 756 tablet 1   Garlic 433 MG CAPS Take 500 mg by mouth daily.     glucose blood (ACCU-CHEK GUIDE) test strip Use 1-4 times daily as directed.  DX E11.9 400 each 3   insulin aspart (NOVOLOG FLEXPEN) 100 UNIT/ML FlexPen Per insulin sliding scale, max TTD, 20 units. 15 mL 11   Insulin Pen Needle (BD PEN NEEDLE MICRO U/F) 32G X 6 MM MISC USE NEW NEEDLE FOR EACH INJECTION OF INSULIN, FOUR TIMES DAILY 100 each 5   ipratropium (ATROVENT) 0.03 % nasal spray Place 2 sprays into both nostrils 2 (two) times daily. 30 mL 3   ketoconazole (NIZORAL) 2 % cream Apply 1 application topically daily. 60 g 0   LEVEMIR FLEXTOUCH 100 UNIT/ML FlexTouch Pen INJECT 16 UNITS SUBCUTANEOUSLY ONCE DAILY AT  10  PM 15 mL 0   levothyroxine (SYNTHROID) 88 MCG tablet TAKE 1 TABLET AT BEDTIME 90 tablet 0   mupirocin ointment (BACTROBAN) 2 % Apply 1 application topically 3 (three) times daily. 22 g 0   nitroGLYCERIN (NITROSTAT) 0.4 MG SL tablet Place 1 tablet (0.4 mg total) under the tongue every 5 (five) minutes as needed for chest pain. 25 tablet 3   NONFORMULARY OR COMPOUNDED ITEM Pain cream : ketamine 5%, baclofen 2%, gabapentin 5%, lidocaine 5%, menthol 1% Order faxed to [facility]     ondansetron (ZOFRAN) 4 MG tablet Take 1 tablet (4 mg total) by mouth every 8 (eight) hours as needed for nausea or vomiting. 20 tablet 0    oxyCODONE-acetaminophen (PERCOCET/ROXICET) 5-325 MG tablet Take 1 tablet by mouth 2 (two) times daily as needed for severe pain.     pantoprazole (PROTONIX) 40 MG tablet Take 1 tablet (40 mg total) by mouth daily. (Patient taking differently: Take 40 mg by mouth at bedtime.) 30 tablet 3   potassium chloride SA (KLOR-CON) 20 MEQ tablet TAKE 1 TAB BY MOUTH 2 TIMES A DAY ON Monday, Wednesday AND Friday  (days you take Lasix) 180 tablet 1   pyridOXINE (VITAMIN B-6)  100 MG tablet Take 100 mg by mouth at bedtime.     rOPINIRole (REQUIP) 0.5 MG tablet TAKE 1 TABLET AT BEDTIME 90 tablet 0   simvastatin (ZOCOR) 20 MG tablet TAKE 1 TABLET BY MOUTH ONCE DAILY WITH SUPPER 90 tablet 3   traZODone (DESYREL) 100 MG tablet Take 1 tablet (100 mg total) by mouth at bedtime. 90 tablet 3   triamcinolone cream (KENALOG) 0.1 % APPLY ONE APPLICATION TOPICALLY TWO TIMES DAILY (Patient taking differently: Apply 1 application topically 2 (two) times daily.) 30 g 0   doxepin (SINEQUAN) 10 MG capsule  (Patient not taking: Reported on 12/08/2020)     No current facility-administered medications for this visit.    Allergies  Allergen Reactions   Valium [Diazepam] Swelling    face      Review of Systems negative except from HPI and PMH  Physical Exam BP (!) 96/58   Pulse 75   Ht '5\' 5"'  (1.651 m)   Wt 147 lb (66.7 kg)   SpO2 98%   BMI 24.46 kg/m  Well developed and well nourished in no acute distress HENT normal Neck supple with JVP-flat Clear Device pocket well healed; without hematoma or erythema.  There is no tethering  Regular rate and rhythm, no murmur Abd-soft with active BS No Clubbing cyanosis  edema Skin-warm and dry A & Oriented  Grossly normal sensory and motor function  ECG: Telemetry   Assessment and  Plan Ischemic cardiomyopathy with interval normalization of LV function   Congestive heart failure-chronic-diastolic  Hypertension  Orthostatic hypotension  Diabetes  CRT-D-[employer]    Grief  Device function is normal.  Blood pressure is low, but no symptoms.  We will continue her on carvedilol 18.75 twice daily given recovered LV function.  Blood pressure is too low to add an ACE or ARB at this time.  No orthostatic symptoms.  Euvolemic.  We will continue Lasix 80 every morning and  40 every afternoon.  Lengthy discussion regarding the loss of her husband and her son the latter by suicide at age of 65

## 2020-12-09 ENCOUNTER — Telehealth: Payer: Self-pay

## 2020-12-09 NOTE — Progress Notes (Signed)
EPIC Encounter for ICM Monitoring  Patient Name: [name] is a 80 y.o. female Date: 12/09/2020 Primary Care Physican: Maximiano Coss, NP Primary Cardiologist: Gwenlyn Found Electrophysiologist: Vergie Living Pacing:  >99%           10/28/2020 Weight: 145-148 lbs                             Attempted call to patient and unable to reach.  Left detailed message per DPR regarding transmission. Transmission reviewed.    Corvue thoracic impedance suggesting normal fluid levels.   Prescribed:  Furosemide 40 mg on take 2 tablets (80 mg total) every morning and 1 tablet (40 mg total) every evening.   Potassium 20 mEq On Monday, Wednesday AND Friday TAKE 1 TAB BY MOUTH 2 TIMES A DAY and on the days you take lasix.   Labs: 12/02/2020 Creatinine 1.20, BUN 15, Potassium 3.9, odium 141, GFR 43.18 A complete set of results can be found in Results Review.   Recommendations:  Left voice mail with ICM number and encouraged to call if experiencing any fluid symptoms.   Follow-up plan: ICM clinic phone appointment on 01/11/2021.   91 day device clinic remote transmission 12/16/2020.     EP/Cardiology Office Visits:     Recall 05/05/2020 with Dr. Gwenlyn Found.   Copy of ICM check sent to Dr. Caryl Comes.    3 month ICM trend: 12/07/2020.    1 Year ICM trend:       [name], RN 12/09/2020 1:07 PM

## 2020-12-09 NOTE — Telephone Encounter (Signed)
Remote ICM transmission received.  Attempted call to patient regarding ICM remote transmission and left detailed message per DPR.  Advised to return call for any fluid symptoms or questions. Next ICM remote transmission scheduled 01/11/2021.

## 2020-12-30 ENCOUNTER — Other Ambulatory Visit: Payer: Self-pay | Admitting: Family Medicine

## 2020-12-30 DIAGNOSIS — R202 Paresthesia of skin: Secondary | ICD-10-CM

## 2021-01-08 ENCOUNTER — Other Ambulatory Visit: Payer: Self-pay | Admitting: Cardiovascular Disease

## 2021-01-08 DIAGNOSIS — I2510 Atherosclerotic heart disease of native coronary artery without angina pectoris: Secondary | ICD-10-CM

## 2021-01-11 ENCOUNTER — Ambulatory Visit (INDEPENDENT_AMBULATORY_CARE_PROVIDER_SITE_OTHER): Payer: Medicare HMO

## 2021-01-11 DIAGNOSIS — Z95810 Presence of automatic (implantable) cardiac defibrillator: Secondary | ICD-10-CM | POA: Diagnosis not present

## 2021-01-11 DIAGNOSIS — I5022 Chronic systolic (congestive) heart failure: Secondary | ICD-10-CM | POA: Diagnosis not present

## 2021-01-12 ENCOUNTER — Telehealth: Payer: Self-pay

## 2021-01-12 NOTE — Progress Notes (Signed)
EPIC Encounter for ICM Monitoring  Patient Name: [name] is a 80 y.o. female Date: 01/12/2021 Primary Care Physican: Maximiano Coss, NP Primary Cardiologist: Gwenlyn Found Electrophysiologist: Vergie Living Pacing:  >99%           12/08/2020 Office Weight: 147 lbs                             Attempted call to patient and unable to reach.  Left detailed message per DPR regarding transmission. Transmission reviewed.    Corvue thoracic impedance suggesting normal fluid levels.   Prescribed:  Furosemide 40 mg on take 2 tablets (80 mg total) every morning and 1 tablet (40 mg total) every evening.   Potassium 20 mEq On Monday, Wednesday AND Friday TAKE 1 TAB BY MOUTH 2 TIMES A DAY and on the days you take lasix.   Labs: 12/02/2020 Creatinine 1.20, BUN 15, Potassium 3.9, Sodium 141, GFR 43.18 A complete set of results can be found in Results Review.   Recommendations:  Left voice mail with ICM number and encouraged to call if experiencing any fluid symptoms.   Follow-up plan: ICM clinic phone appointment on 02/15/2021.   91 day device clinic remote transmission 03/17/2021.     EP/Cardiology Office Visits:  Recall 05/05/2020 with Dr. Gwenlyn Found.   Copy of ICM check sent to Dr. Caryl Comes.   3 month ICM trend: 01/11/2021.    1 Year ICM trend:       [name], RN 01/12/2021 2:22 PM

## 2021-01-12 NOTE — Telephone Encounter (Signed)
Remote ICM transmission received.  Attempted call to patient regarding ICM remote transmission and left detailed message per DPR.  Advised to return call for any fluid symptoms or questions. Next ICM remote transmission scheduled 02/15/2021.

## 2021-01-15 NOTE — Progress Notes (Signed)
Established Patient Office Visit  Subjective:  Patient ID: [name], female    DOB: 11/29/1940  Age: 80 y.o. MRN: [id number]  CC:  Chief Complaint  Patient presents with   Results    Patient states she would like to discuss her results for her CT. Per patient she would like to discuss and antibiotic cream.    HPI [name] presents for results review and med management  CT on 05/04/20 "IMPRESSION: 1. Scattered nodular densities in the lungs bilaterally, some of which were present on 05/30/2019 in the upper lobes, others are new. An ongoing infectious/inflammatory etiology is favored. Malignancy cannot be excluded. Follow-up CT chest without contrast in 3 months is recommended. 2. Aortic atherosclerosis (ICD10-I70.0). Coronary artery Calcification"  Wants to discuss these findings. Extensive smoking history. COPD, OSA. Hx of laryngeal cancer. No hemoptysis, no shob, sudden weight changes, or other acute red flags.  Wants to review mupirocin ointment - has used topically without AE.    Past Medical History:  Diagnosis Date   Anxiety    Arthritis    Chronic pain    a. Prior h/o chronic pain on methadone.   Chronic systolic CHF (congestive heart failure) (HCC)    a. mixed ischemic/non-ischemic cardiomyopathy. b. s/p CRT-D in 06/2014.   CKD (chronic kidney disease), stage III (HCC)    Complication of anesthesia    hard time waking her up from general surgery   Coronary artery disease    a. remote RCA stenting in 2008 with non-DES. b. Cath 06/2014 following abnormal nuc: Stable, unchanged from prior cath, patent stent and 40% LM.   Diabetes mellitus (HCC)    GERD (gastroesophageal reflux disease)    Headache    Hyperlipidemia    Hypertension    LBBB (left bundle branch block)    Nonischemic cardiomyopathy (HCC)    OSA (obstructive sleep apnea)    AHI-9.77/hr, during REM-50.32/hr   Tobacco abuse     Past Surgical History:  Procedure Laterality Date   BACK  SURGERY  2013   BI-VENTRICULAR IMPLANTABLE CARDIOVERTER DEFIBRILLATOR N/A 06/11/2014   STJ CRTD implanted by Dr Caryl Comes   CARDIAC CATHETERIZATION  12/14/2006   RCA stented with a 3.0 [employer] Liberte stent resulting in a reduction of 75% to 0% residual   CHOLECYSTECTOMY     20 years ago   COLONOSCOPY WITH PROPOFOL N/A 12/15/2014   Procedure: COLONOSCOPY WITH PROPOFOL;  Surgeon: Clarene Essex, MD;  Location: [facility] ENDOSCOPY;  Service: Endoscopy;  Laterality: N/A;   EYE SURGERY     bilateral cataract surgery    LEFT AND RIGHT HEART CATHETERIZATION WITH CORONARY ANGIOGRAM N/A 05/29/2014   Procedure: LEFT AND RIGHT HEART CATHETERIZATION WITH CORONARY ANGIOGRAM;  Surgeon: Lorretta Harp, MD;  Location: [facility] CATH LAB;  Service: Cardiovascular;  Laterality: N/A;   LEFT HEART CATHETERIZATION WITH CORONARY ANGIOGRAM N/A 12/27/2012   Procedure: LEFT HEART CATHETERIZATION WITH CORONARY ANGIOGRAM;  Surgeon: Lorretta Harp, MD;  Location: [facility] CATH LAB;  Service: Cardiovascular;  Laterality: N/A;    Family History  Problem Relation Age of Onset   Stroke Mother    Hypertension Mother    Coronary artery disease Father    Stroke Brother    Heart disease Brother    Other Brother        H1N1 VIRUS   Healthy Sister    Healthy Sister     Social History   Socioeconomic History   Marital status: Widowed    Spouse  name: Not on file   Number of children: Not on file   Years of education: Not on file   Highest education level: Not on file  Occupational History   Not on file  Tobacco Use   Smoking status: Every Day    Packs/day: 2.00    Years: 58.00    Pack years: 116.00    Types: Cigarettes    Start date: 12/26/1952    Last attempt to quit: 12/05/2013    Years since quitting: 7.1   Smokeless tobacco: Never   Tobacco comments:    uses Vape cigarettes  Vaping Use   Vaping Use: Never used  Substance and Sexual Activity   Alcohol use: No    Alcohol/week: 0.0 standard drinks   Drug use: No    Sexual activity: Not on file  Other Topics Concern   Not on file  Social History Narrative   Patient lives in [place] with his husband and grandson.   Son completed suicide on September 17th of this year, patient found him.   Social Determinants of Health   Financial Resource Strain: Not on file  Food Insecurity: Not on file  Transportation Needs: Not on file  Physical Activity: Not on file  Stress: Not on file  Social Connections: Not on file  Intimate Partner Violence: Not on file    Outpatient Medications Prior to Visit  Medication Sig Dispense Refill   ascorbic acid (VITAMIN C) 500 MG tablet Take 500 mg by mouth daily.     aspirin EC 81 MG tablet Take 81 mg by mouth at bedtime.     blood glucose meter kit and supplies KIT Dispense based on patient and insurance preference. Use three times a day as directed. Dx. E11.65, V78.5 1 each 11   Garlic 885 MG CAPS Take 500 mg by mouth daily.     mupirocin ointment (BACTROBAN) 2 % Apply 1 application topically 3 (three) times daily. 22 g 0   nitroGLYCERIN (NITROSTAT) 0.4 MG SL tablet Place 1 tablet (0.4 mg total) under the tongue every 5 (five) minutes as needed for chest pain. 25 tablet 3   NONFORMULARY OR COMPOUNDED ITEM Pain cream : ketamine 5%, baclofen 2%, gabapentin 5%, lidocaine 5%, menthol 1% Order faxed to [facility] (PERCOCET/ROXICET) 5-325 MG tablet Take 1 tablet by mouth 2 (two) times daily as needed for severe pain.     pantoprazole (PROTONIX) 40 MG tablet Take 1 tablet (40 mg total) by mouth daily. (Patient taking differently: Take 40 mg by mouth at bedtime.) 30 tablet 3   pyridOXINE (VITAMIN B-6) 100 MG tablet Take 100 mg by mouth at bedtime.     triamcinolone cream (KENALOG) 0.1 % APPLY ONE APPLICATION TOPICALLY TWO TIMES DAILY (Patient taking differently: Apply 1 application topically 2 (two) times daily.) 30 g 0   carvedilol (COREG) 12.5 MG tablet TAKE 1 1/2 TABLET BY MOUTH TWICE A  DAY (Patient taking differently: Take 18.75 mg by mouth 2 (two) times daily.) 270 tablet 2   Cholecalciferol (DIALYVITE VITAMIN D 5000) 125 MCG (5000 UT) capsule Take 5,000 Units by mouth daily.     clopidogrel (PLAVIX) 75 MG tablet Take 1 tablet (75 mg total) by mouth daily. (Patient taking differently: Take 75 mg by mouth at bedtime.) 90 tablet 3   diclofenac Sodium (VOLTAREN) 1 % GEL Apply 1 application topically 4 (four) times daily as needed (pain). (Patient not taking: Reported on 12/02/2020)     furosemide (LASIX)  40 MG tablet TAKE 2 TABLETS BY MOUTH ONCE DAILY IN THE MORNING AND  TAKE  1  TABLET  IN  THE  EVENING 180 tablet 3   insulin aspart (NOVOLOG FLEXPEN) 100 UNIT/ML FlexPen Per insulin sliding scale, max TTD, 20 units. (Patient taking differently: Inject 2-3 Units into the skin daily as needed (blood sugar over 180).) 15 mL 11   insulin detemir (LEVEMIR FLEXTOUCH) 100 UNIT/ML FlexPen INJECT 16 UNITS SUBCUTANEOUSLY ONCE DAILY AT  10  PM (Patient taking differently: Inject 16 Units into the skin daily before supper.) 15 mL 0   Insulin Pen Needle (BD PEN NEEDLE MICRO U/F) 32G X 6 MM MISC USE NEW NEEDLE FOR EACH INJECTION OF INSULIN, FOUR TIMES DAILY 100 each 5   ipratropium (ATROVENT) 0.03 % nasal spray Place 2 sprays into both nostrils 2 (two) times daily. (Patient taking differently: Place 2-3 sprays into both nostrils daily.) 30 mL 3   levothyroxine (SYNTHROID) 88 MCG tablet Take 1 tablet (88 mcg total) by mouth at bedtime. 90 tablet 0   ONE TOUCH ULTRA TEST test strip   11   ONETOUCH DELICA LANCETS 59R MISC   11   potassium chloride SA (KLOR-CON) 20 MEQ tablet TAKE 1 TAB BY MOUTH 2 TIMES A DAY ON Monday, Wednesday AND Friday  (days you take Lasix) (Patient taking differently: Take 20 mEq by mouth See admin instructions. Take 20 meq twice daily on Monday, Wednesday, and Friday  (days you take Lasix)) 180 tablet 0   rOPINIRole (REQUIP) 0.5 MG tablet Take 1 tablet (0.5 mg total) by mouth at  bedtime. 30 tablet 3   simvastatin (ZOCOR) 20 MG tablet TAKE 1 TABLET BY MOUTH ONCE DAILY WITH SUPPER (Patient taking differently: Take 20 mg by mouth every evening.) 90 tablet 3   traZODone (DESYREL) 50 MG tablet Take 0.5-1 tablets (25-50 mg total) by mouth at bedtime. (Patient taking differently: Take 50 mg by mouth at bedtime.) 90 tablet 1   albuterol (VENTOLIN HFA) 108 (90 Base) MCG/ACT inhaler Inhale 2 puffs into the lungs every 6 (six) hours as needed for wheezing or shortness of breath. 8 g 0   benzonatate (TESSALON) 200 MG capsule TAKE 1 CAPSULE BY MOUTH TWICE DAILY AS NEEDED FOR COUGH (Patient not taking: Reported on 12/08/2020) 20 capsule 0   No facility-administered medications prior to visit.    Allergies  Allergen Reactions   Valium [Diazepam] Swelling    face    ROS Review of Systems  Constitutional: Negative.   HENT: Negative.    Eyes: Negative.   Respiratory: Negative.    Cardiovascular: Negative.   Gastrointestinal: Negative.   Genitourinary: Negative.   Musculoskeletal: Negative.   Skin: Negative.   Neurological: Negative.   Psychiatric/Behavioral: Negative.       Objective:    Physical Exam Vitals and nursing note reviewed.  Constitutional:      General: She is not in acute distress.    Appearance: Normal appearance. She is normal weight. She is not ill-appearing, toxic-appearing or diaphoretic.  Cardiovascular:     Rate and Rhythm: Normal rate and regular rhythm.     Heart sounds: Normal heart sounds. No murmur heard.   No friction rub. No gallop.  Pulmonary:     Effort: Pulmonary effort is normal. No respiratory distress.     Breath sounds: No stridor. Wheezing (same as previous) present. No rhonchi or rales.  Chest:     Chest wall: No tenderness.  Skin:    General:  Skin is warm and dry.  Neurological:     General: No focal deficit present.     Mental Status: She is alert and oriented to person, place, and time. Mental status is at baseline.   Psychiatric:        Mood and Affect: Mood normal.        Behavior: Behavior normal.        Thought Content: Thought content normal.        Judgment: Judgment normal.    BP 126/64   Pulse 82   Temp 98 F (36.7 C) (Temporal)   Resp 18   Ht 5' 6" (1.676 m)   Wt 147 lb 9.6 oz (67 kg)   SpO2 100%   BMI 23.82 kg/m  Wt Readings from Last 3 Encounters:  12/08/20 147 lb (66.7 kg)  12/02/20 149 lb 6.4 oz (67.8 kg)  07/27/20 148 lb 9.6 oz (67.4 kg)     Health Maintenance Due  Topic Date Due   URINE MICROALBUMIN  05/15/2019   COVID-19 Vaccine (3 - Pfizer risk series) 05/20/2019   OPHTHALMOLOGY EXAM  01/13/2021    There are no preventive care reminders to display for this patient.  Lab Results  Component Value Date   TSH 2.74 12/02/2020   Lab Results  Component Value Date   WBC 8.6 12/02/2020   HGB 15.0 12/02/2020   HCT 44.9 12/02/2020   MCV 94.5 12/02/2020   PLT 202.0 12/02/2020   Lab Results  Component Value Date   NA 141 12/02/2020   K 3.9 12/02/2020   CO2 32 12/02/2020   GLUCOSE 126 (H) 12/02/2020   BUN 15 12/02/2020   CREATININE 1.20 12/02/2020   BILITOT 0.6 12/02/2020   ALKPHOS 70 12/02/2020   AST 15 12/02/2020   ALT 11 12/02/2020   PROT 7.1 12/02/2020   ALBUMIN 4.4 12/02/2020   CALCIUM 9.8 12/02/2020   ANIONGAP 12 08/31/2016   GFR 43.18 (L) 12/02/2020   Lab Results  Component Value Date   CHOL 143 12/02/2020   Lab Results  Component Value Date   HDL 57.90 12/02/2020   Lab Results  Component Value Date   LDLCALC 66 12/02/2020   Lab Results  Component Value Date   TRIG 94.0 12/02/2020   Lab Results  Component Value Date   CHOLHDL 2 12/02/2020   Lab Results  Component Value Date   HGBA1C 6.4 12/02/2020      Assessment & Plan:   Problem List Items Addressed This Visit   None Visit Diagnoses     Abnormal CT lung screening    -  Primary   Medication management           No orders of the defined types were placed in this  encounter.   Follow-up: No follow-ups on file.   PLAN Reviewed safety, efficacy, and proper use of mupirocin. Reviewed chest. Nodules of concern and warrant follow up but no acute biopsy/bronchoscopy. Reviewed potential etiologies - many are noncancerous - and red flags. Pt voices understanding Patient encouraged to call clinic with any questions, comments, or concerns.  I spent 42 minutes with this patient reviewing imaging and medication safety.  Maximiano Coss, NP

## 2021-01-26 ENCOUNTER — Other Ambulatory Visit: Payer: Self-pay | Admitting: Registered Nurse

## 2021-01-26 DIAGNOSIS — E039 Hypothyroidism, unspecified: Secondary | ICD-10-CM

## 2021-02-08 ENCOUNTER — Other Ambulatory Visit: Payer: Self-pay | Admitting: Cardiovascular Disease

## 2021-02-15 ENCOUNTER — Ambulatory Visit (INDEPENDENT_AMBULATORY_CARE_PROVIDER_SITE_OTHER): Payer: Medicare HMO

## 2021-02-15 DIAGNOSIS — Z95810 Presence of automatic (implantable) cardiac defibrillator: Secondary | ICD-10-CM

## 2021-02-15 DIAGNOSIS — I5022 Chronic systolic (congestive) heart failure: Secondary | ICD-10-CM | POA: Diagnosis not present

## 2021-02-19 ENCOUNTER — Telehealth: Payer: Self-pay

## 2021-02-19 NOTE — Progress Notes (Signed)
EPIC Encounter for ICM Monitoring  Patient Name: [name] is a 80 y.o. female Date: 02/19/2021 Primary Care Physican: Maximiano Coss, NP Primary Cardiologist: Gwenlyn Found Electrophysiologist: Vergie Living Pacing:  >99%           12/08/2020 Office Weight: 147 lbs                             Attempted call to patient and unable to reach.  Left detailed message per DPR regarding transmission. Transmission reviewed.    Corvue thoracic impedance suggesting normal fluid levels.   Prescribed:  Furosemide 40 mg on take 2 tablets (80 mg total) every morning and 1 tablet (40 mg total) every evening.   Potassium 20 mEq On Monday, Wednesday AND Friday TAKE 1 TAB BY MOUTH 2 TIMES A DAY and on the days you take lasix.   Labs: 12/02/2020 Creatinine 1.20, BUN 15, Potassium 3.9, Sodium 141, GFR 43.18 A complete set of results can be found in Results Review.   Recommendations:  Left voice mail with ICM number and encouraged to call if experiencing any fluid symptoms.   Follow-up plan: ICM clinic phone appointment on 03/22/2021.   91 day device clinic remote transmission 03/17/2021.     EP/Cardiology Office Visits:  Recall 05/05/2020 with Dr. Gwenlyn Found.   Copy of ICM check sent to Dr. Caryl Comes.   3 month ICM trend: 02/15/2021.    12-14 Month ICM trend:       [name], RN 02/19/2021 3:40 PM

## 2021-02-19 NOTE — Telephone Encounter (Signed)
Remote ICM transmission received.  Attempted call to patient regarding ICM remote transmission and left detailed message per DPR.  Advised to return call for any fluid symptoms or questions. Next ICM remote transmission scheduled 03/22/2021.

## 2021-03-10 DIAGNOSIS — M5416 Radiculopathy, lumbar region: Secondary | ICD-10-CM | POA: Diagnosis not present

## 2021-03-16 ENCOUNTER — Other Ambulatory Visit: Payer: Self-pay | Admitting: Registered Nurse

## 2021-03-16 DIAGNOSIS — R202 Paresthesia of skin: Secondary | ICD-10-CM

## 2021-03-17 ENCOUNTER — Ambulatory Visit (INDEPENDENT_AMBULATORY_CARE_PROVIDER_SITE_OTHER): Payer: Medicare HMO

## 2021-03-17 DIAGNOSIS — I5022 Chronic systolic (congestive) heart failure: Secondary | ICD-10-CM

## 2021-03-17 LAB — CUP PACEART REMOTE DEVICE CHECK
Battery Remaining Longevity: 19 mo
Battery Remaining Percentage: 20 %
Battery Voltage: 2.77 V
Brady Statistic AP VP Percent: 14 %
Brady Statistic AP VS Percent: 1 %
Brady Statistic AS VP Percent: 86 %
Brady Statistic AS VS Percent: 1 %
Brady Statistic RA Percent Paced: 13 %
Date Time Interrogation Session: 20230111111725
HighPow Impedance: 71 Ohm
HighPow Impedance: 71 Ohm
Implantable Lead Implant Date: 20160406
Implantable Lead Implant Date: 20160406
Implantable Lead Implant Date: 20160406
Implantable Lead Location: 753858
Implantable Lead Location: 753859
Implantable Lead Location: 753860
Implantable Lead Model: 7122
Implantable Pulse Generator Implant Date: 20160406
Lead Channel Impedance Value: 1275 Ohm
Lead Channel Impedance Value: 350 Ohm
Lead Channel Impedance Value: 610 Ohm
Lead Channel Pacing Threshold Amplitude: 0.75 V
Lead Channel Pacing Threshold Amplitude: 0.875 V
Lead Channel Pacing Threshold Amplitude: 0.875 V
Lead Channel Pacing Threshold Pulse Width: 0.5 ms
Lead Channel Pacing Threshold Pulse Width: 0.5 ms
Lead Channel Pacing Threshold Pulse Width: 0.5 ms
Lead Channel Sensing Intrinsic Amplitude: 12 mV
Lead Channel Sensing Intrinsic Amplitude: 5 mV
Lead Channel Setting Pacing Amplitude: 1.75 V
Lead Channel Setting Pacing Amplitude: 2 V
Lead Channel Setting Pacing Amplitude: 2 V
Lead Channel Setting Pacing Pulse Width: 0.5 ms
Lead Channel Setting Pacing Pulse Width: 0.5 ms
Lead Channel Setting Sensing Sensitivity: 0.5 mV
Pulse Gen Serial Number: 7199559

## 2021-03-22 ENCOUNTER — Telehealth: Payer: Self-pay

## 2021-03-22 ENCOUNTER — Other Ambulatory Visit: Payer: Self-pay | Admitting: Cardiovascular Disease

## 2021-03-22 ENCOUNTER — Ambulatory Visit (INDEPENDENT_AMBULATORY_CARE_PROVIDER_SITE_OTHER): Payer: Medicare HMO

## 2021-03-22 DIAGNOSIS — I5022 Chronic systolic (congestive) heart failure: Secondary | ICD-10-CM

## 2021-03-22 DIAGNOSIS — Z95810 Presence of automatic (implantable) cardiac defibrillator: Secondary | ICD-10-CM | POA: Diagnosis not present

## 2021-03-22 NOTE — Telephone Encounter (Signed)
Remote ICM transmission received.  Attempted call to patient regarding ICM remote transmission and left detailed message per DPR.  Advised to return call for any fluid symptoms or questions. Next ICM remote transmission scheduled 02/07/2022.    

## 2021-03-22 NOTE — Progress Notes (Signed)
EPIC Encounter for ICM Monitoring  Patient Name: [name] is a 81 y.o. female Date: 03/22/2021 Primary Care Physican: Maximiano Coss, NP Primary Cardiologist: Gwenlyn Found Electrophysiologist: Vergie Living Pacing:  >99%           12/08/2020 Office Weight: 147 lbs                             Attempted call to patient and unable to reach.  Left detailed message per DPR regarding transmission. Transmission reviewed.    Corvue thoracic impedance suggesting possible fluid accumulation starting 03/17/2021.   Prescribed:  Furosemide 40 mg on take 2 tablets (80 mg total) every morning and 1 tablet (40 mg total) every evening.   Potassium 20 mEq On Monday, Wednesday AND Friday TAKE 1 TAB BY MOUTH 2 TIMES A DAY and on the days you take lasix.   Labs: 12/02/2020 Creatinine 1.20, BUN 15, Potassium 3.9, Sodium 141, GFR 43.18 A complete set of results can be found in Results Review.   Recommendations:  Left voice mail with ICM number and encouraged to call if experiencing any fluid symptoms.   Follow-up plan: ICM clinic phone appointment on 03/30/2021 to recheck fluid levels.   91 day device clinic remote transmission 06/16/2021.     EP/Cardiology Office Visits:  Recall 05/05/2020 with Dr. Gwenlyn Found.   Copy of ICM check sent to Dr. Caryl Comes.  Will send to Dr Gwenlyn Found for review if patient is reached.   3 month ICM trend: 03/22/2021.    12-14 Month ICM trend:     [name], RN 03/22/2021 5:12 PM

## 2021-03-29 NOTE — Progress Notes (Signed)
Remote ICD transmission.   

## 2021-03-30 ENCOUNTER — Ambulatory Visit (INDEPENDENT_AMBULATORY_CARE_PROVIDER_SITE_OTHER): Payer: Medicare HMO

## 2021-03-30 DIAGNOSIS — Z95810 Presence of automatic (implantable) cardiac defibrillator: Secondary | ICD-10-CM

## 2021-03-30 DIAGNOSIS — I5022 Chronic systolic (congestive) heart failure: Secondary | ICD-10-CM

## 2021-03-31 NOTE — Progress Notes (Signed)
EPIC Encounter for ICM Monitoring  Patient Name: [name] is a 81 y.o. female Date: 03/31/2021 Primary Care Physican: Maximiano Coss, NP Primary Cardiologist: Gwenlyn Found Electrophysiologist: Vergie Living Pacing:  >99%           03/31/2021 Weight: 146-149 lbs                             Spoke with patient and heart failure questions reviewed.  Pt asymptomatic for fluid accumulation.  She is not following low salt diet or limiting fluid intake.  Advised to call Dr Kennon Holter office regarding deep feeling of soreness near breast and she is overdue for 6 month follow up appointment.    Corvue thoracic impedance suggesting possible fluid accumulation starting 03/28/2021.  Possible fluid accumulation from 1/11-1/20.   Prescribed:  Furosemide 40 mg on take 2 tablets (80 mg total) every morning and 1 tablet (40 mg total) every evening.   Potassium 20 mEq On Monday, Wednesday AND Friday TAKE 1 TAB BY MOUTH 2 TIMES A DAY and on the days you take lasix.   Labs: 12/02/2020 Creatinine 1.20, BUN 15, Potassium 3.9, Sodium 141, GFR 43.18 A complete set of results can be found in Results Review.   Recommendations:  Recommendation to limit salt intake to 2000 mg daily and fluid intake to 64 oz daily.  Encouraged to call if experiencing any fluid symptoms.    Follow-up plan: ICM clinic phone appointment on 04/09/2021 to recheck fluid levels.   91 day device clinic remote transmission 06/16/2021.     EP/Cardiology Office Visits:  Advised to call for overdue appointment with Dr Gwenlyn Found.   Copy of ICM check sent to Dr. Caryl Comes.  Copy sent to Dr Gwenlyn Found for review and recommendations if needed.    3 month ICM trend: 03/30/2021.    12-14 Month ICM trend:     [name], RN 03/31/2021 9:57 AM

## 2021-04-06 ENCOUNTER — Telehealth: Payer: Self-pay | Admitting: Registered Nurse

## 2021-04-06 ENCOUNTER — Other Ambulatory Visit: Payer: Self-pay

## 2021-04-06 DIAGNOSIS — Z794 Long term (current) use of insulin: Secondary | ICD-10-CM

## 2021-04-06 DIAGNOSIS — E119 Type 2 diabetes mellitus without complications: Secondary | ICD-10-CM

## 2021-04-06 MED ORDER — LEVEMIR FLEXTOUCH 100 UNIT/ML ~~LOC~~ SOPN: PEN_INJECTOR | SUBCUTANEOUS | 0 refills | Status: DC

## 2021-04-06 NOTE — Progress Notes (Signed)
Spoke with patient.  Advised Dr Gwenlyn Found unable to provide recommendations since she has not been seen in 2 years. She did not realize it had been that long and will call today for appointment.  Advised will recheck fluid levels at the end of the week.

## 2021-04-06 NOTE — Progress Notes (Signed)
Lorretta Harp, MD  Jeremy Mclamb Panda, RN; Beatrix Fetters, RN I have not seen [name] in 2 years . She's overdue for a ROV

## 2021-04-06 NOTE — Telephone Encounter (Signed)
sent 

## 2021-04-06 NOTE — Telephone Encounter (Signed)
Pt called in asking for levemir to [facility] mail order.  Please advise   Next appt 06/01/21

## 2021-04-09 ENCOUNTER — Ambulatory Visit (INDEPENDENT_AMBULATORY_CARE_PROVIDER_SITE_OTHER): Payer: Medicare HMO

## 2021-04-09 DIAGNOSIS — Z95810 Presence of automatic (implantable) cardiac defibrillator: Secondary | ICD-10-CM

## 2021-04-09 DIAGNOSIS — I5022 Chronic systolic (congestive) heart failure: Secondary | ICD-10-CM

## 2021-04-09 NOTE — Progress Notes (Signed)
EPIC Encounter for ICM Monitoring  Patient Name: [name] is a 81 y.o. female Date: 04/09/2021 Primary Care Physican: Maximiano Coss, NP Primary Cardiologist: Gwenlyn Found Electrophysiologist: Vergie Living Pacing:  >99%           03/31/2021 Weight: 146-149 lbs                             Attempted call to patient and unable to reach.  Left detailed message per DPR regarding transmission. Transmission reviewed.    Corvue thoracic impedance suggesting fluid levels returned to normal on 03/31/2021.   Prescribed:  Furosemide 40 mg on take 2 tablets (80 mg total) every morning and 1 tablet (40 mg total) every evening.   Potassium 20 mEq On Monday, Wednesday AND Friday TAKE 1 TAB BY MOUTH 2 TIMES A DAY and on the days you take lasix.   Labs: 12/02/2020 Creatinine 1.20, BUN 15, Potassium 3.9, Sodium 141, GFR 43.18 A complete set of results can be found in Results Review.   Recommendations:  Left voice mail with ICM number and encouraged to call if experiencing any fluid symptoms.    Follow-up plan: ICM clinic phone appointment on 05/03/2021.   91 day device clinic remote transmission 06/16/2021.     EP/Cardiology Office Visits:  Pt is aware to call Dr Kennon Holter office for overdue appointment.   Copy of ICM check sent to Dr. Caryl Comes.   3 month ICM trend: 04/06/2021.     [name], RN 04/09/2021 10:22 AM

## 2021-04-14 ENCOUNTER — Other Ambulatory Visit: Payer: Self-pay

## 2021-04-14 ENCOUNTER — Ambulatory Visit (HOSPITAL_COMMUNITY)
Admission: RE | Admit: 2021-04-14 | Discharge: 2021-04-14 | Disposition: A | Discharge status: Home / Self Care | Payer: Medicare HMO | Source: Ambulatory Visit | Attending: Cardiovascular Disease | Admitting: Cardiovascular Disease

## 2021-04-14 DIAGNOSIS — I6523 Occlusion and stenosis of bilateral carotid arteries: Secondary | ICD-10-CM | POA: Diagnosis not present

## 2021-04-14 DIAGNOSIS — I6529 Occlusion and stenosis of unspecified carotid artery: Secondary | ICD-10-CM | POA: Insufficient documentation

## 2021-04-15 ENCOUNTER — Other Ambulatory Visit: Payer: Self-pay

## 2021-04-15 DIAGNOSIS — I6529 Occlusion and stenosis of unspecified carotid artery: Secondary | ICD-10-CM

## 2021-04-24 NOTE — Progress Notes (Signed)
Established Patient Office Visit  Subjective:  Patient ID: [name], female    DOB: [birth year]  Age: 81 y.o. MRN: [id number]  CC:  Chief Complaint  Patient presents with   Transitions Of Care    Feels well overall     HPI [name] presents for visit to est care  No concerns  Histories reviewed and updated with patient.   Reviewed recent labs. Chronic conditions stable. Discussed ca screening and health maintenance.   Past Medical History:  Diagnosis Date   Anxiety    Arthritis    Chronic pain    a. Prior h/o chronic pain on methadone.   Chronic systolic CHF (congestive heart failure) (HCC)    a. mixed ischemic/non-ischemic cardiomyopathy. b. s/p CRT-D in 06/2014.   CKD (chronic kidney disease), stage III (HCC)    Complication of anesthesia    hard time waking her up from general surgery   Coronary artery disease    a. remote RCA stenting in 2008 with non-DES. b. Cath 06/2014 following abnormal nuc: Stable, unchanged from prior cath, patent stent and 40% LM.   Diabetes mellitus (HCC)    GERD (gastroesophageal reflux disease)    Headache    Hyperlipidemia    Hypertension    LBBB (left bundle branch block)    Nonischemic cardiomyopathy (HCC)    OSA (obstructive sleep apnea)    AHI-9.77/hr, during REM-50.32/hr   Tobacco abuse     Past Surgical History:  Procedure Laterality Date   BACK SURGERY  2013   BI-VENTRICULAR IMPLANTABLE CARDIOVERTER DEFIBRILLATOR N/A 06/11/2014   STJ CRTD implanted by Dr Caryl Comes   CARDIAC CATHETERIZATION  12/14/2006   RCA stented with a 3.0 [employer] Liberte stent resulting in a reduction of 75% to 0% residual   CHOLECYSTECTOMY     20 years ago   COLONOSCOPY WITH PROPOFOL N/A 12/15/2014   Procedure: COLONOSCOPY WITH PROPOFOL;  Surgeon: Clarene Essex, MD;  Location: [facility];  Service: Endoscopy;  Laterality: N/A;   EYE SURGERY     bilateral cataract surgery    LEFT AND RIGHT HEART CATHETERIZATION WITH CORONARY ANGIOGRAM N/A  05/29/2014   Procedure: LEFT AND RIGHT HEART CATHETERIZATION WITH CORONARY ANGIOGRAM;  Surgeon: Lorretta Harp, MD;  Location: [facility] CATH LAB;  Service: Cardiovascular;  Laterality: N/A;   LEFT HEART CATHETERIZATION WITH CORONARY ANGIOGRAM N/A 12/27/2012   Procedure: LEFT HEART CATHETERIZATION WITH CORONARY ANGIOGRAM;  Surgeon: Lorretta Harp, MD;  Location: [facility] CATH LAB;  Service: Cardiovascular;  Laterality: N/A;    Family History  Problem Relation Age of Onset   Stroke Mother    Hypertension Mother    Coronary artery disease Father    Stroke Brother    Heart disease Brother    Other Brother        H1N1 VIRUS   Healthy Sister    Healthy Sister     Social History   Socioeconomic History   Marital status: Widowed    Spouse name: Not on file   Number of children: Not on file   Years of education: Not on file   Highest education level: Not on file  Occupational History   Not on file  Tobacco Use   Smoking status: Every Day    Packs/day: 2.00    Years: 58.00    Pack years: 116.00    Types: Cigarettes    Start date: 12/26/1952    Last attempt to quit: 12/05/2013    Years since quitting: 7.3  Smokeless tobacco: Never   Tobacco comments:    uses Vape cigarettes  Vaping Use   Vaping Use: Never used  Substance and Sexual Activity   Alcohol use: No    Alcohol/week: 0.0 standard drinks   Drug use: No   Sexual activity: Not on file  Other Topics Concern   Not on file  Social History Narrative   Patient lives in [place] with his husband and grandson.   Son completed suicide on September 17th of this year, patient found him.   Social Determinants of Health   Financial Resource Strain: Not on file  Food Insecurity: Not on file  Transportation Needs: Not on file  Physical Activity: Not on file  Stress: Not on file  Social Connections: Not on file  Intimate Partner Violence: Not on file    Outpatient Medications Prior to Visit  Medication Sig Dispense Refill    ascorbic acid (VITAMIN C) 500 MG tablet Take 500 mg by mouth daily.     aspirin EC 81 MG tablet Take 81 mg by mouth at bedtime.     blood glucose meter kit and supplies KIT Dispense based on patient and insurance preference. Use three times a day as directed. Dx. E11.65, V49.4 1 each 11   Garlic 496 MG CAPS Take 500 mg by mouth daily.     mupirocin ointment (BACTROBAN) 2 % Apply 1 application topically 3 (three) times daily. 22 g 0   nitroGLYCERIN (NITROSTAT) 0.4 MG SL tablet Place 1 tablet (0.4 mg total) under the tongue every 5 (five) minutes as needed for chest pain. 25 tablet 3   oxyCODONE-acetaminophen (PERCOCET/ROXICET) 5-325 MG tablet Take 1 tablet by mouth 2 (two) times daily as needed for severe pain.     pantoprazole (PROTONIX) 40 MG tablet Take 1 tablet (40 mg total) by mouth daily. (Patient taking differently: Take 40 mg by mouth at bedtime.) 30 tablet 3   pyridOXINE (VITAMIN B-6) 100 MG tablet Take 100 mg by mouth at bedtime.     triamcinolone cream (KENALOG) 0.1 % APPLY ONE APPLICATION TOPICALLY TWO TIMES DAILY (Patient taking differently: Apply 1 application topically 2 (two) times daily.) 30 g 0   carvedilol (COREG) 12.5 MG tablet TAKE 1 1/2 TABLET BY MOUTH TWICE A DAY (Patient taking differently: Take 18.75 mg by mouth 2 (two) times daily.) 270 tablet 2   Cholecalciferol (DIALYVITE VITAMIN D 5000) 125 MCG (5000 UT) capsule Take 5,000 Units by mouth daily.     clopidogrel (PLAVIX) 75 MG tablet Take 1 tablet (75 mg total) by mouth daily. (Patient taking differently: Take 75 mg by mouth at bedtime.) 90 tablet 3   Dextromethorphan-guaiFENesin (MUCINEX DM MAXIMUM STRENGTH) 60-1200 MG TB12 Take 1 tablet by mouth every 12 (twelve) hours. 30 tablet 1   diclofenac Sodium (VOLTAREN) 1 % GEL Apply 1 application topically 4 (four) times daily as needed (pain). (Patient not taking: Reported on 12/02/2020)     furosemide (LASIX) 40 MG tablet Take 2 tablets (80 mg total) by mouth every morning AND 1  tablet (40 mg total) every evening. 80 MG IN THE AM AND 40MG IN THE PM.. (Patient taking differently: Take 2 tablets (80 mg total) by mouth every morning AND 1 tablet (40 mg total) every evening.) 180 tablet 1   ibuprofen (ADVIL) 200 MG tablet Take 200 mg by mouth at bedtime.     insulin aspart (NOVOLOG FLEXPEN) 100 UNIT/ML FlexPen Per insulin sliding scale, max TTD, 20 units. (Patient taking differently: Inject 2-3  Units into the skin daily as needed (blood sugar over 180).) 15 mL 11   insulin detemir (LEVEMIR FLEXTOUCH) 100 UNIT/ML FlexPen INJECT 16 UNITS SUBCUTANEOUSLY ONCE DAILY AT  10  PM (Patient taking differently: Inject 16 Units into the skin daily before supper.) 15 mL 0   Insulin Pen Needle (BD PEN NEEDLE MICRO U/F) 32G X 6 MM MISC USE NEW NEEDLE FOR EACH INJECTION OF INSULIN, FOUR TIMES DAILY 100 each 5   ipratropium (ATROVENT) 0.03 % nasal spray Place 2 sprays into both nostrils 2 (two) times daily. (Patient taking differently: Place 2-3 sprays into both nostrils daily.) 30 mL 3   levothyroxine (SYNTHROID) 88 MCG tablet Take 1 tablet (88 mcg total) by mouth at bedtime. 90 tablet 0   ONE TOUCH ULTRA TEST test strip   11   ONETOUCH DELICA LANCETS 12W MISC   11   potassium chloride SA (KLOR-CON) 20 MEQ tablet TAKE 1 TAB BY MOUTH 2 TIMES A DAY ON Monday, Wednesday AND Friday  (days you take Lasix) (Patient taking differently: Take 20 mEq by mouth See admin instructions. Take 20 meq twice daily on Monday, Wednesday, and Friday  (days you take Lasix)) 180 tablet 0   rOPINIRole (REQUIP) 0.5 MG tablet Take 1 tablet (0.5 mg total) by mouth at bedtime. 30 tablet 3   simvastatin (ZOCOR) 20 MG tablet TAKE 1 TABLET BY MOUTH ONCE DAILY WITH SUPPER (Patient taking differently: Take 20 mg by mouth every evening.) 90 tablet 3   traZODone (DESYREL) 50 MG tablet Take 0.5-1 tablets (25-50 mg total) by mouth at bedtime. (Patient taking differently: Take 50 mg by mouth at bedtime.) 90 tablet 1   albuterol  (VENTOLIN HFA) 108 (90 Base) MCG/ACT inhaler Inhale 2 puffs into the lungs every 6 (six) hours as needed for wheezing or shortness of breath. 8 g 0   NONFORMULARY OR COMPOUNDED ITEM Pain cream : ketamine 5%, baclofen 2%, gabapentin 5%, lidocaine 5%, menthol 1% Order faxed to [facility]     benzonatate (TESSALON) 200 MG capsule TAKE 1 CAPSULE BY MOUTH TWICE DAILY AS NEEDED FOR COUGH (Patient not taking: Reported on 12/08/2020) 20 capsule 0   sucralfate (CARAFATE) 1 GM/10ML suspension Take 10 mLs (1 g total) by mouth 4 (four) times daily -  with meals and at bedtime. (Patient not taking: Reported on 03/20/2020) 420 mL 0   No facility-administered medications prior to visit.    Allergies  Allergen Reactions   Valium [Diazepam] Swelling    face    ROS Review of Systems  Constitutional: Negative.   HENT: Negative.    Eyes: Negative.   Respiratory: Negative.    Cardiovascular: Negative.   Gastrointestinal: Negative.   Genitourinary: Negative.   Musculoskeletal: Negative.   Skin: Negative.   Neurological: Negative.   Psychiatric/Behavioral: Negative.    All other systems reviewed and are negative.    Objective:    Physical Exam Vitals and nursing note reviewed.  Constitutional:      General: She is not in acute distress.    Appearance: Normal appearance. She is normal weight. She is not ill-appearing, toxic-appearing or diaphoretic.  Cardiovascular:     Rate and Rhythm: Normal rate and regular rhythm.     Heart sounds: Normal heart sounds. No murmur heard.   No friction rub. No gallop.  Pulmonary:     Effort: Pulmonary effort is normal. No respiratory distress.     Breath sounds: Normal breath sounds. No stridor. No wheezing, rhonchi or rales.  Chest:     Chest wall: No tenderness.  Skin:    General: Skin is warm and dry.  Neurological:     General: No focal deficit present.     Mental Status: She is alert and oriented to person, place, and time. Mental status is at  baseline.  Psychiatric:        Mood and Affect: Mood normal.        Behavior: Behavior normal.        Thought Content: Thought content normal.        Judgment: Judgment normal.    BP (!) 104/58    Pulse 71    Temp 97.9 F (36.6 C) (Temporal)    Ht _0  (1.676 m)    Wt 146 lb (66.2 kg)    SpO2 94%    BMI 23.57 kg/m  Wt Readings from Last 3 Encounters:  12/08/20 147 lb (66.7 kg)  12/02/20 149 lb 6.4 oz (67.8 kg)  07/27/20 148 lb 9.6 oz (67.4 kg)     Health Maintenance Due  Topic Date Due   DEXA SCAN  Never done   URINE MICROALBUMIN  05/15/2019   COVID-19 Vaccine (3 - Pfizer risk series) 05/20/2019   OPHTHALMOLOGY EXAM  01/13/2021    There are no preventive care reminders to display for this patient.  Lab Results  Component Value Date   TSH 2.74 12/02/2020   Lab Results  Component Value Date   WBC 8.6 12/02/2020   HGB 15.0 12/02/2020   HCT 44.9 12/02/2020   MCV 94.5 12/02/2020   PLT 202.0 12/02/2020   Lab Results  Component Value Date   NA 141 12/02/2020   K 3.9 12/02/2020   CO2 32 12/02/2020   GLUCOSE 126 (H) 12/02/2020   BUN 15 12/02/2020   CREATININE 1.20 12/02/2020   BILITOT 0.6 12/02/2020   ALKPHOS 70 12/02/2020   AST 15 12/02/2020   ALT 11 12/02/2020   PROT 7.1 12/02/2020   ALBUMIN 4.4 12/02/2020   CALCIUM 9.8 12/02/2020   ANIONGAP 12 08/31/2016   GFR 43.18 (L) 12/02/2020   Lab Results  Component Value Date   CHOL 143 12/02/2020   Lab Results  Component Value Date   HDL 57.90 12/02/2020   Lab Results  Component Value Date   LDLCALC 66 12/02/2020   Lab Results  Component Value Date   TRIG 94.0 12/02/2020   Lab Results  Component Value Date   CHOLHDL 2 12/02/2020   Lab Results  Component Value Date   HGBA1C 6.4 12/02/2020      Assessment & Plan:   Problem List Items Addressed This Visit       Endocrine   Diabetes mellitus type 2 with neurological manifestations ([place]) - Primary   Diabetic polyneuropathy ([place])      Musculoskeletal and Integument   Osteoarthritis    No orders of the defined types were placed in this encounter.   Follow-up: No follow-ups on file.   PLAN Continue current medication regimen Return in 3 mo for labs and vitals Patient encouraged to call clinic with any questions, comments, or concerns.  Maximiano Coss, NP

## 2021-04-27 ENCOUNTER — Other Ambulatory Visit: Payer: Self-pay

## 2021-04-27 ENCOUNTER — Telehealth: Payer: Self-pay | Admitting: Registered Nurse

## 2021-04-27 DIAGNOSIS — G894 Chronic pain syndrome: Secondary | ICD-10-CM | POA: Diagnosis not present

## 2021-04-27 DIAGNOSIS — Z9889 Other specified postprocedural states: Secondary | ICD-10-CM | POA: Diagnosis not present

## 2021-04-27 DIAGNOSIS — F1190 Opioid use, unspecified, uncomplicated: Secondary | ICD-10-CM | POA: Diagnosis not present

## 2021-04-27 DIAGNOSIS — M5416 Radiculopathy, lumbar region: Secondary | ICD-10-CM | POA: Diagnosis not present

## 2021-04-27 DIAGNOSIS — E119 Type 2 diabetes mellitus without complications: Secondary | ICD-10-CM

## 2021-04-27 MED ORDER — INSULIN DETEMIR 100 UNIT/ML ~~LOC~~ SOLN: SUBCUTANEOUS | 2 refills | Status: DC

## 2021-04-27 MED ORDER — LEVEMIR FLEXTOUCH 100 UNIT/ML ~~LOC~~ SOPN: PEN_INJECTOR | SUBCUTANEOUS | 1 refills | Status: DC

## 2021-04-27 NOTE — Telephone Encounter (Signed)
Called pt and apologized for the confusion and informed her this has now been corrected

## 2021-04-27 NOTE — Telephone Encounter (Signed)
Chief Complaint Prescription Refill or Medication Request (non symptomatic) Reason for Call Medication Question / Request Initial Comment Caller states their Levemir insulin needs to be called in at [facility]. Translation No Nurse Assessment Nurse: Kerrin Champagne, RN, Butch Penny Date/Time Eilene Ghazi Time): 04/26/2021 8:14:11 PM Confirm and document reason for call. If symptomatic, describe symptoms. ---Caller states that she needs RX refill of Levemir insulin to be called in to [facility]. States that the Levemir is no longer available and she will need the RX changed. Reports that she has 2-3 days worth of med left. Denies new/worsening s/sx. Does the patient have any new or worsening symptoms? ---No Nurse: Kerrin Champagne, RN, Butch Penny Date/Time (Eastern Time): 04/26/2021 8:41:23 PM Please select the assessment type ---Refill Does the patient have enough medication to last until the office opens? ---Yes Additional Documentation ---Advised pt to call PCP office on Tues AM for RX refill request and to call back prior to then for new/ worsening s/sx. Pt verbalized understanding. Disp. Time Eilene Ghazi Time) Disposition Final User 04/26/2021 5:42:44 PM Send To Nurse Ferne Coe, RN, Maudry Mayhew 04/26/2021 5:42:55 PM Send To Nurse Ferne Coe, RN, Maudry Mayhew 04/26/2021 5:43:03 PM Send To Nurse Ferne Coe, RN, Maudry Mayhew 04/26/2021 5:43:11 PM Send To Nurse Ferne Coe, RN, Retta Diones NOTE: All timestamps contained within this report are represented as [place] Standard Time. CONFIDENTIALTY NOTICE: This fax transmission is intended only for the addressee. It contains information that is legally privileged, confidential or otherwise protected from use or disclosure. If you are not the intended recipient, you are strictly prohibited from reviewing, disclosing, copying using or disseminating any of this information or taking any action in reliance on or regarding this information. If you have received this fax in  error, please notify us immediately by telephone so that we can arrange for its return to [place]. Phone: [phone], Toll-Free: [phone], Fax: [phone] Page: 2 of 2 Call Id: [id number] [place]. Time Eilene Ghazi Time) Disposition Final User 04/26/2021 5:43:15 PM Send To Nurse Ferne Coe, RN, Maudry Mayhew 04/26/2021 5:43:20 PM Send To Nurse Ferne Coe, RN, Maudry Mayhew 04/26/2021 5:43:26 PM Send To Nurse Ferne Coe, RN, Maudry Mayhew 04/26/2021 5:43:40 PM Send To Nurse Ferne Coe, RN, Maudry Mayhew 04/26/2021 8:42:36 PM Clinical Call Yes Kerrin Champagne, RN, Butch Penny

## 2021-04-27 NOTE — Telephone Encounter (Signed)
Sent in refill to [facility] as the new [place]

## 2021-04-27 NOTE — Telephone Encounter (Signed)
Pt called stating that the [facility] flextouch is no longer available.   Please advise

## 2021-05-10 ENCOUNTER — Ambulatory Visit (INDEPENDENT_AMBULATORY_CARE_PROVIDER_SITE_OTHER): Payer: Medicare HMO

## 2021-05-10 DIAGNOSIS — I5022 Chronic systolic (congestive) heart failure: Secondary | ICD-10-CM | POA: Diagnosis not present

## 2021-05-10 DIAGNOSIS — Z95810 Presence of automatic (implantable) cardiac defibrillator: Secondary | ICD-10-CM

## 2021-05-13 ENCOUNTER — Other Ambulatory Visit: Payer: Self-pay | Admitting: Cardiovascular Disease

## 2021-05-13 DIAGNOSIS — I5022 Chronic systolic (congestive) heart failure: Secondary | ICD-10-CM

## 2021-05-13 DIAGNOSIS — I2510 Atherosclerotic heart disease of native coronary artery without angina pectoris: Secondary | ICD-10-CM

## 2021-05-14 NOTE — Progress Notes (Signed)
EPIC Encounter for ICM Monitoring ? ?Patient Name: [name] is a 81 y.o. female ?Date: 05/14/2021 ?Primary Care Physican: Maximiano Coss, NP ?Primary Cardiologist: Gwenlyn Found ?Electrophysiologist: Caryl Comes ?Bi-V Pacing:  >99%           ?05/14/2021 Weight: 146-149 lbs                           ?  ?Spoke with patient and heart failure questions reviewed.  Pt asymptomatic for fluid accumulation.  Reports feeling well at this time and voices no complaints.  ?  ?Corvue thoracic impedance suggesting normal fluid levels. ?  ?Prescribed:  ?Furosemide 40 mg on take 2 tablets (80 mg total) every morning and 1 tablet (40 mg total) every evening.   ?Potassium 20 mEq On Monday, Wednesday AND Friday TAKE 1 TAB BY MOUTH 2 TIMES A DAY and on the days you take lasix. ?  ?Labs: ?12/02/2020 Creatinine 1.20, BUN 15, Potassium 3.9, Sodium 141, GFR 43.18 ?A complete set of results can be found in Results Review. ?  ?Recommendations:  No changes and encouraged to call if experiencing any fluid symptoms. ?  ?Follow-up plan: ICM clinic phone appointment on 06/14/2021.   91 day device clinic remote transmission 06/16/2021.   ?  ?EP/Cardiology Office Visits:  06/23/2021 with Dr Gwenlyn Found. ?  ?Copy of ICM check sent to Dr. Caryl Comes.  ? ?3 month ICM trend: 05/10/2021. ? ? ? ?12-14 Month ICM trend:  ? ? ? ?[name], RN ?05/14/2021 ?2:47 PM ? ?

## 2021-06-01 ENCOUNTER — Ambulatory Visit (INDEPENDENT_AMBULATORY_CARE_PROVIDER_SITE_OTHER): Payer: Medicare HMO | Admitting: Registered Nurse

## 2021-06-01 ENCOUNTER — Other Ambulatory Visit: Payer: Self-pay

## 2021-06-01 ENCOUNTER — Encounter: Payer: Self-pay | Admitting: Registered Nurse

## 2021-06-01 VITALS — BP 105/67 | HR 72 | Temp 98.0°F | Resp 18 | Ht 65.0 in | Wt 143.6 lb

## 2021-06-01 DIAGNOSIS — Z794 Long term (current) use of insulin: Secondary | ICD-10-CM | POA: Diagnosis not present

## 2021-06-01 DIAGNOSIS — Z862 Personal history of diseases of the blood and blood-forming organs and certain disorders involving the immune mechanism: Secondary | ICD-10-CM | POA: Diagnosis not present

## 2021-06-01 DIAGNOSIS — I5022 Chronic systolic (congestive) heart failure: Secondary | ICD-10-CM | POA: Diagnosis not present

## 2021-06-01 DIAGNOSIS — E039 Hypothyroidism, unspecified: Secondary | ICD-10-CM

## 2021-06-01 DIAGNOSIS — R531 Weakness: Secondary | ICD-10-CM

## 2021-06-01 DIAGNOSIS — R209 Unspecified disturbances of skin sensation: Secondary | ICD-10-CM

## 2021-06-01 DIAGNOSIS — E1122 Type 2 diabetes mellitus with diabetic chronic kidney disease: Secondary | ICD-10-CM

## 2021-06-01 DIAGNOSIS — I2510 Atherosclerotic heart disease of native coronary artery without angina pectoris: Secondary | ICD-10-CM

## 2021-06-01 DIAGNOSIS — R42 Dizziness and giddiness: Secondary | ICD-10-CM | POA: Diagnosis not present

## 2021-06-01 DIAGNOSIS — I10 Essential (primary) hypertension: Secondary | ICD-10-CM

## 2021-06-01 DIAGNOSIS — N183 Chronic kidney disease, stage 3 unspecified: Secondary | ICD-10-CM | POA: Diagnosis not present

## 2021-06-01 DIAGNOSIS — E119 Type 2 diabetes mellitus without complications: Secondary | ICD-10-CM | POA: Diagnosis not present

## 2021-06-01 DIAGNOSIS — E538 Deficiency of other specified B group vitamins: Secondary | ICD-10-CM

## 2021-06-01 DIAGNOSIS — E559 Vitamin D deficiency, unspecified: Secondary | ICD-10-CM

## 2021-06-01 DIAGNOSIS — E785 Hyperlipidemia, unspecified: Secondary | ICD-10-CM

## 2021-06-01 LAB — COMPREHENSIVE METABOLIC PANEL
ALT: 9 U/L (ref 0–35)
AST: 12 U/L (ref 0–37)
Albumin: 4.4 g/dL (ref 3.5–5.2)
Alkaline Phosphatase: 69 U/L (ref 39–117)
BUN: 18 mg/dL (ref 6–23)
CO2: 31 mEq/L (ref 19–32)
Calcium: 10 mg/dL (ref 8.4–10.5)
Chloride: 98 mEq/L (ref 96–112)
Creatinine, Ser: 1.46 mg/dL — ABNORMAL HIGH (ref 0.40–1.20)
GFR: 34.01 mL/min — ABNORMAL LOW (ref >60.00)
Glucose, Bld: 106 mg/dL — ABNORMAL HIGH (ref 70–99)
Potassium: 3.8 mEq/L (ref 3.5–5.1)
Sodium: 139 mEq/L (ref 135–145)
Total Bilirubin: 0.7 mg/dL (ref 0.2–1.2)
Total Protein: 7 g/dL (ref 6.0–8.3)

## 2021-06-01 LAB — CBC WITH DIFFERENTIAL/PLATELET
Basophils Absolute: 0 10*3/uL (ref 0.0–0.1)
Basophils Relative: 0.5 % (ref 0.0–3.0)
Eosinophils Absolute: 0.1 10*3/uL (ref 0.0–0.7)
Eosinophils Relative: 1.4 % (ref 0.0–5.0)
HCT: 42.5 % (ref 36.0–46.0)
Hemoglobin: 14.4 g/dL (ref 12.0–15.0)
Lymphocytes Relative: 14 % (ref 12.0–46.0)
Lymphs Abs: 1.2 10*3/uL (ref 0.7–4.0)
MCHC: 33.9 g/dL (ref 30.0–36.0)
MCV: 93.8 fl (ref 78.0–100.0)
Monocytes Absolute: 0.6 10*3/uL (ref 0.1–1.0)
Monocytes Relative: 7.3 % (ref 3.0–12.0)
Neutro Abs: 6.8 10*3/uL (ref 1.4–7.7)
Neutrophils Relative %: 76.8 % (ref 43.0–77.0)
Platelets: 238 10*3/uL (ref 150.0–400.0)
RBC: 4.53 Mil/uL (ref 3.87–5.11)
RDW: 13.4 % (ref 11.5–15.5)
WBC: 8.9 10*3/uL (ref 4.0–10.5)

## 2021-06-01 LAB — B12 AND FOLATE PANEL
Folate: 7.9 ng/mL (ref >5.9)
Vitamin B-12: 223 pg/mL (ref 211–911)

## 2021-06-01 LAB — POCT URINALYSIS DIPSTICK
Bilirubin, UA: NEGATIVE
Blood, UA: NEGATIVE
Glucose, UA: NEGATIVE
Ketones, UA: NEGATIVE
Leukocytes, UA: NEGATIVE
Nitrite, UA: NEGATIVE
Protein, UA: NEGATIVE
Spec Grav, UA: 1.015 (ref 1.010–1.025)
Urobilinogen, UA: 0.2 E.U./dL
pH, UA: 6.5 (ref 5.0–8.0)

## 2021-06-01 LAB — LIPID PANEL
Cholesterol: 140 mg/dL (ref 0–200)
HDL: 53.5 mg/dL (ref >39.00)
LDL Cholesterol: 70 mg/dL (ref 0–99)
NonHDL: 86.52
Total CHOL/HDL Ratio: 3
Triglycerides: 83 mg/dL (ref 0.0–149.0)
VLDL: 16.6 mg/dL (ref 0.0–40.0)

## 2021-06-01 LAB — VITAMIN D 25 HYDROXY (VIT D DEFICIENCY, FRACTURES): VITD: >120 ng/mL

## 2021-06-01 LAB — POCT GLYCOSYLATED HEMOGLOBIN (HGB A1C): Hemoglobin A1C: 6.2 % — AB (ref 4.0–5.6)

## 2021-06-01 LAB — GLUCOSE, POCT (MANUAL RESULT ENTRY): POC Glucose: 105 mg/dl — AB (ref 70–99)

## 2021-06-01 LAB — TSH: TSH: 1.69 u[IU]/mL (ref 0.35–5.50)

## 2021-06-01 LAB — T4, FREE: Free T4: 1.22 ng/dL (ref 0.60–1.60)

## 2021-06-01 NOTE — Patient Instructions (Signed)
Ms. Lorio -  ? ?EKG stable from October.  ? ?Sugars great - 6.2 a1c, 105 glucose. ? ?Urinalysis unremarkable ? ?Let's check further labs. I'll be in touch this afternoon if results are concerning ? ?Thank you, ? ?Rich  ?

## 2021-06-01 NOTE — Assessment & Plan Note (Signed)
Recheck today. Treat as indicated.  ?

## 2021-06-01 NOTE — Progress Notes (Signed)
? ?Established Patient Office Visit ? ?Subjective:  ?Patient ID: [name], female    DOB: [birth year]  Age: 81 y.o. MRN: [id number] ? ?CC:  ?Chief Complaint  ?Patient presents with  ? Follow-up  ?  Patient states she is here for a 6 month follow up. Patient states she has has been a little weak, light headed and has been so cold she has been shaking.  ? ? ?HPI ?[name] presents for 6 mo follow up.  ? ?t2dm ?Last A1c:  ?Lab Results  ?Component Value Date  ? HGBA1C 6.2 (A) 06/01/2021  ?  ?Currently taking: insulin detemir 16 units daily ?No new complications ?Reports good compliance with medications ?Diet has been steady ?Exercise habits have been steady ? ?Hypertension: ?Patient Currently taking: carvedilol 12.5mg  po bid, furosemide 40mg  po two tabs in am one in pm,  ?Good effect. No AEs. ?Denies CV symptoms including: chest pain, shob, doe, headache, visual changes, fatigue, claudication, and dependent edema.  ? ?Previous readings and labs: ?BP Readings from Last 3 Encounters:  ?06/01/21 105/67  ?12/08/20 (!) 96/58  ?12/02/20 (!) 98/51  ? ?Lab Results  ?Component Value Date  ? CREATININE 1.20 12/02/2020  ? ? ?Notes acute concerns ?Dizziness, fatigue, weakness, shaking, cold sensation. ?Cold sensation over past 4-5 mo.  ?Weakness only going on for a few weeks - trouble with opening jars, small tasks she used to be able to do.  ?Known hx of CHF, anemia, long time smoker. ?Denies hemoptysis, dark stools, chest pain, LOC ? ?Follows with Dr. Caryl Comes, Dr. Gwenlyn Found for cardiology ?Has followed with Dr. Watt Climes for GI in the past.  ? ? ? ?Outpatient Medications Prior to Visit  ?Medication Sig Dispense Refill  ? Accu-Chek Softclix Lancets lancets Use 1-4 times daily as directed.  DX E11.9 360 each 3  ? albuterol (VENTOLIN HFA) 108 (90 Base) MCG/ACT inhaler Inhale 2 puffs into the lungs every 6 (six) hours as needed for wheezing or shortness of breath. 8 g 0  ? Alcohol Swabs (B-D SINGLE USE SWABS REGULAR) PADS Use 1-4 times  daily as directed.  DX E11.9 400 each 3  ? ammonium lactate (AMLACTIN) 12 % lotion Apply 1 application topically as needed for dry skin. 400 g 0  ? ascorbic acid (VITAMIN C) 500 MG tablet Take 500 mg by mouth daily.    ? aspirin EC 81 MG tablet Take 81 mg by mouth at bedtime.    ? blood glucose meter kit and supplies KIT Dispense based on patient and insurance preference. Use three times a day as directed. Dx. E11.65, Z79.4 1 each 11  ? blood glucose meter kit and supplies Dispense based on patient and insurance preference. Use up to four times daily as directed. (FOR ICD-10 E10.9, E11.9). 1 each 0  ? Blood Glucose Monitoring Suppl (ACCU-CHEK GUIDE) w/Device KIT 1 Device by Does not apply route 4 (four) times daily. 1 kit 0  ? carvedilol (COREG) 12.5 MG tablet TAKE 1 AND 1/2 TABLETS TWICE DAILY 270 tablet 0  ? clopidogrel (PLAVIX) 75 MG tablet TAKE 1 TABLET EVERY DAY 90 tablet 1  ? doxepin (SINEQUAN) 10 MG capsule     ? furosemide (LASIX) 40 MG tablet TAKE 2 TABLETS ONE TIME DAILY IN THE MORNING AND TAKE 1 TABLET EVERY EVENING 270 tablet 1  ? Garlic 355 MG CAPS Take 500 mg by mouth daily.    ? glucose blood (ACCU-CHEK GUIDE) test strip Use 1-4 times daily as directed.  DX  E11.9 400 each 3  ? insulin aspart (NOVOLOG FLEXPEN) 100 UNIT/ML FlexPen Per insulin sliding scale, max TTD, 20 units. 15 mL 11  ? insulin detemir (LEVEMIR FLEXTOUCH) 100 UNIT/ML FlexPen Injects 16 units subcutaneously once daily 15 mL 1  ? insulin detemir (LEVEMIR) 100 UNIT/ML injection Inject 16 units subcutaneously once daily 10 mL 2  ? Insulin Pen Needle (BD PEN NEEDLE MICRO U/F) 32G X 6 MM MISC USE NEW NEEDLE FOR EACH INJECTION OF INSULIN, FOUR TIMES DAILY 100 each 5  ? ipratropium (ATROVENT) 0.03 % nasal spray Place 2 sprays into both nostrils 2 (two) times daily. 30 mL 3  ? ketoconazole (NIZORAL) 2 % cream Apply 1 application topically daily. 60 g 0  ? levothyroxine (SYNTHROID) 88 MCG tablet TAKE 1 TABLET AT BEDTIME 90 tablet 0  ? mupirocin  ointment (BACTROBAN) 2 % Apply 1 application topically 3 (three) times daily. 22 g 0  ? nitroGLYCERIN (NITROSTAT) 0.4 MG SL tablet Place 1 tablet (0.4 mg total) under the tongue every 5 (five) minutes as needed for chest pain. 25 tablet 3  ? NONFORMULARY OR COMPOUNDED ITEM Pain cream : ketamine 5%, baclofen 2%, gabapentin 5%, lidocaine 5%, menthol 1% ?Order faxed to [facility]    ? ondansetron (ZOFRAN) 4 MG tablet Take 1 tablet (4 mg total) by mouth every 8 (eight) hours as needed for nausea or vomiting. 20 tablet 0  ? oxyCODONE-acetaminophen (PERCOCET/ROXICET) 5-325 MG tablet Take 1 tablet by mouth 2 (two) times daily as needed for severe pain.    ? pantoprazole (PROTONIX) 40 MG tablet Take 1 tablet (40 mg total) by mouth daily. (Patient taking differently: Take 40 mg by mouth at bedtime.) 30 tablet 3  ? potassium chloride SA (KLOR-CON M) 20 MEQ tablet TAKE 1 TABLET TWICE DAILY ON MONDAY, WEDNESDAY AND FRIDAY (DAYS YOU TAKE LASIX/FUROSEMIDE) 90 tablet 2  ? pyridOXINE (VITAMIN B-6) 100 MG tablet Take 100 mg by mouth at bedtime.    ? rOPINIRole (REQUIP) 0.5 MG tablet TAKE 1 TABLET AT BEDTIME 90 tablet 0  ? simvastatin (ZOCOR) 20 MG tablet TAKE 1 TABLET BY MOUTH ONCE DAILY WITH SUPPER 90 tablet 3  ? traZODone (DESYREL) 100 MG tablet Take 1 tablet (100 mg total) by mouth at bedtime. 90 tablet 3  ? triamcinolone cream (KENALOG) 0.1 % APPLY ONE APPLICATION TOPICALLY TWO TIMES DAILY (Patient taking differently: Apply 1 application. topically 2 (two) times daily.) 30 g 0  ? ?No facility-administered medications prior to visit.  ? ? ?Review of Systems ?Per hpi  ? ?  ?Objective:  ?  ? ?BP 105/67   Pulse 72   Temp 98 ?F (36.7 ?C) (Temporal)   Resp 18   Ht $R'5\' 5"'yz$  (1.651 m)   Wt 143 lb 9.6 oz (65.1 kg)   SpO2 100%   BMI 23.90 kg/m?  ? ?Wt Readings from Last 3 Encounters:  ?06/01/21 143 lb 9.6 oz (65.1 kg)  ?12/08/20 147 lb (66.7 kg)  ?12/02/20 149 lb 6.4 oz (67.8 kg)  ? ?Physical Exam ?Vitals and nursing note  reviewed.  ?Constitutional:   ?   General: She is not in acute distress. ?   Appearance: Normal appearance. She is normal weight. She is not ill-appearing, toxic-appearing or diaphoretic.  ?Cardiovascular:  ?   Rate and Rhythm: Normal rate and regular rhythm.  ?   Heart sounds: Normal heart sounds. No murmur heard. ?  No friction rub. No gallop.  ?Pulmonary:  ?   Effort: Pulmonary effort  is normal. No respiratory distress.  ?   Breath sounds: Normal breath sounds. No stridor. No wheezing, rhonchi or rales.  ?Chest:  ?   Chest wall: No tenderness.  ?Skin: ?   General: Skin is warm and dry.  ?Neurological:  ?   General: No focal deficit present.  ?   Mental Status: She is alert and oriented to person, place, and time. Mental status is at baseline.  ?Psychiatric:     ?   Mood and Affect: Mood normal.     ?   Behavior: Behavior normal.     ?   Thought Content: Thought content normal.     ?   Judgment: Judgment normal.  ? ? ?Results for orders placed or performed in visit on 06/01/21  ?POCT Urinalysis Dipstick  ?Result Value Ref Range  ? Color, UA yellow   ? Clarity, UA clear   ? Glucose, UA Negative Negative  ? Bilirubin, UA negative   ? Ketones, UA negative   ? Spec Grav, UA 1.015 1.010 - 1.025  ? Blood, UA negative   ? pH, UA 6.5 5.0 - 8.0  ? Protein, UA Negative Negative  ? Urobilinogen, UA 0.2 0.2 or 1.0 E.U./dL  ? Nitrite, UA negative   ? Leukocytes, UA Negative Negative  ? Appearance    ? Odor    ?POCT HgB A1C  ?Result Value Ref Range  ? Hemoglobin A1C 6.2 (A) 4.0 - 5.6 %  ? HbA1c POC (<> result, manual entry)    ? HbA1c, POC (prediabetic range)    ? HbA1c, POC (controlled diabetic range)    ?POCT glucose (manual entry)  ?Result Value Ref Range  ? POC Glucose 105 (A) 70 - 99 mg/dl  ? ? ? ? ?The 10-year ASCVD risk score (Arnett DK, et al., 2019) is: 49% ? ?  ?Assessment & Plan:  ? ?Problem List Items Addressed This Visit   ? ?  ? Cardiovascular and Mediastinum  ? Essential hypertension  ?  Stable on current meds.  Check labs. Continue rx.  ?  ?  ? Chronic systolic CHF (congestive heart failure) ([place])  ? Relevant Orders  ? EKG 12-Lead (Completed)  ?  ? Endocrine  ? Type 2 diabetes mellitus with stage 3 chronic kidney diseas

## 2021-06-01 NOTE — Assessment & Plan Note (Signed)
Recheck labs. Concern symptoms may have contributions from thyroid. Adjust dosing basedon results  ?

## 2021-06-01 NOTE — Assessment & Plan Note (Signed)
Stable on current meds. Check labs. Continue rx.  ?

## 2021-06-01 NOTE — Assessment & Plan Note (Signed)
A1c 6.2 today. Essentially steady from 6.4 at last visit. Sugars 105. No concerns ?Continue current insulin dosing.  ?Recheck in 6 mo ?

## 2021-06-02 ENCOUNTER — Telehealth: Payer: Self-pay | Admitting: Registered Nurse

## 2021-06-02 DIAGNOSIS — E559 Vitamin D deficiency, unspecified: Secondary | ICD-10-CM

## 2021-06-02 NOTE — Telephone Encounter (Signed)
Who Is Calling Lab / Radiology ?Lab Name [facility] ?Lab Phone Number [phone] ?Lab Tech Name [facility] ?Lab Reference Number N/A ?Chief Complaint Lab Result (Critical or Stat) ?Call Type Lab Send to RN ?Reason for Call Report lab results ?Initial Comment Caller states she needs to report a critical value. ?Translation No ?Nurse Assessment ?Nurse: Nicole Cella, RN, [place] Date/Time (Eastern Time): 06/01/2021 5:12:54 PM ?Is there an on-call provider listed? ---Yes ?Please list name of person reporting value (Lab ?Employee) and a contact number. ---Hope from [facility] [phone] ?Please document the following items: Lab name Lab ?value (read back to lab to verify) Reference range ?for lab value Date and time blood was drawn Collect ?time of birth for bilirubin results ?---Lab: Vitamin D level Lab value: >120 Ref Range: ?30-100 06/01/21 09:49AM ?Please collect the patient contact information from ?the lab. (name, phone number and address) ---[name] [phone] ?List any special notes provided by lab. ---Last drawn: May 2022 Vitamin D level: 98.79 ?Disp. Time (Eastern ?Time) Disposition Final User ?06/01/2021 5:19:24 PM Paged On Call back to Call [place], [place], [place] ?06/01/2021 5:25:09 PM Clinical Call Yes [place], RN, [place] ?PLEASE NOTE: All timestamps contained within this report are represented as [place] Standard Time. ?CONFIDENTIALTY NOTICE: This fax transmission is intended only for the addressee. It contains information that is legally privileged, confidential or ?otherwise protected from use or disclosure. If you are not the intended recipient, you are strictly prohibited from reviewing, disclosing, copying using ?or disseminating any of this information or taking any action in reliance on or regarding this information. If you have received this fax in error, please ?notify us immediately by telephone so that we can arrange for its return to [place]. Phone: [phone], Toll-Free: [phone], Fax:  [phone] ?Page: 2 of 2 ?Call Id: [id number] ?Paging ?DoctorName Phone DateTime Result/ ?Outcome Message Type Notes ?Kathlene November - MD [phone] ?06/01/2021 ?5:19:24 ?PM ?Paged On Call ?Back to Call ?Center ?Doctor Paged Critical lab notification. Please ?call Elwin Sleight, [place] ?Kathlene November - MD ?06/01/2021 ?5:24:38 ?PM ?Spoke with On ?Call - General Message Result states he will notify the patients ?

## 2021-06-02 NOTE — Telephone Encounter (Signed)
I did send her a MyChart message on this - if we could call to confirm she saw this. She should stop taking any Vitamin D supplements. We should recheck in 4 weeks, can be lab only. ? ?Other results per notes on labs ? ?Thanks, ? ?Rich

## 2021-06-03 LAB — URINE CULTURE
MICRO NUMBER:: 13192568
SPECIMEN QUALITY:: ADEQUATE

## 2021-06-03 LAB — IRON,TIBC AND FERRITIN PANEL
%SAT: 30 % (calc) (ref 16–45)
Ferritin: 97 ng/mL (ref 16–288)
Iron: 84 ug/dL (ref 45–160)
TIBC: 280 mcg/dL (calc) (ref 250–450)

## 2021-06-03 NOTE — Telephone Encounter (Signed)
Attempted to call patient. LVM for her to return the call  ?

## 2021-06-04 NOTE — Telephone Encounter (Signed)
Patient returned Brooke's call, I informed her of her labs and set up a nurse visit for the vitamin d. Future order placed ?

## 2021-06-04 NOTE — Addendum Note (Signed)
Addended by: Juliann Pulse on: 06/04/2021 04:13 PM ? ? Modules accepted: Orders ? ?

## 2021-06-14 NOTE — Progress Notes (Signed)
No ICM remote transmission received for 06/14/2021 and next ICM transmission scheduled for 06/22/2021.   ?

## 2021-06-16 ENCOUNTER — Encounter: Payer: Self-pay | Admitting: Registered Nurse

## 2021-06-16 ENCOUNTER — Ambulatory Visit (INDEPENDENT_AMBULATORY_CARE_PROVIDER_SITE_OTHER): Payer: Medicare HMO

## 2021-06-16 ENCOUNTER — Telehealth (INDEPENDENT_AMBULATORY_CARE_PROVIDER_SITE_OTHER): Payer: Medicare HMO | Admitting: Registered Nurse

## 2021-06-16 DIAGNOSIS — I5022 Chronic systolic (congestive) heart failure: Secondary | ICD-10-CM | POA: Diagnosis not present

## 2021-06-16 DIAGNOSIS — I428 Other cardiomyopathies: Secondary | ICD-10-CM

## 2021-06-16 DIAGNOSIS — F17210 Nicotine dependence, cigarettes, uncomplicated: Secondary | ICD-10-CM | POA: Diagnosis not present

## 2021-06-16 NOTE — Progress Notes (Signed)
? ? ?Telemedicine Encounter- SOAP NOTE Established Patient ? ?This telephone encounter was conducted with the patient's (or proxy's) verbal consent via audio telecommunications: yes/no: Yes ?Patient was instructed to have this encounter in a suitably private space; and to only have persons present to whom they give permission to participate. In addition, patient identity was confirmed by use of name plus two identifiers (DOB and address).  I discussed the limitations, risks, security and privacy concerns of performing an evaluation and management service by telephone and the availability of in person appointments. I also discussed with the patient that there may be a patient responsible charge related to this service. The patient expressed understanding and agreed to proceed. ? ?I spent a total of 22 minutes talking with the patient or their proxy. ? ?Patient at home ?Provider in office ? ?Participants: Kathrin Ruddy, NP and [place] ? ?No chief complaint on file. ? ? ?Subjective  ? ?[name] is a 81 y.o. established patient. Telephone visit today for follow up ? ?HPI ?Had CT scan for smoking hx in march 2022. Needed 3 mo follow up for nodules. These were stable ?She is due for annual screening. ?She does not have symptoms. ?She has cut back on smoking quite a bit, especially since her sister's dx of uterine cancer.  ? ?She does again aver that she would not pursue curative treatment if diagnosed with lung ca. However, she is interested in an early diagnosis to help with her end of life planning.  ? ? ?Patient Active Problem List  ? Diagnosis Date Noted  ? Abdominal distension (gaseous) 08/21/2020  ? Abnormal weight loss 08/21/2020  ? Benign neoplasm of colon 08/21/2020  ? Constipation 08/21/2020  ? Diabetes mellitus type 2 with neurological manifestations ([place]) 08/21/2020  ? Diabetic polyneuropathy ([place]) 08/21/2020  ? Diarrhea of presumed infectious origin 08/21/2020  ? Diverticular disease of colon  08/21/2020  ? Dysphagia 08/21/2020  ? Gastro-esophageal reflux disease without esophagitis 08/21/2020  ? Incontinence of feces 08/21/2020  ? Osteoarthritis 08/21/2020  ? Personal history of colonic polyps 08/21/2020  ? Vitamin B12 deficiency 08/21/2020  ? Non-melanoma skin cancer 09/06/2019  ? Dysphonia 05/16/2019  ? Pharyngeal dysphagia 05/16/2019  ? NICM (nonischemic cardiomyopathy) ([place]) 05/06/2019  ? Carotid bruit present 05/06/2019  ? Adjustment reaction with anxiety and depression 04/30/2017  ? Insulin dependent type 2 diabetes mellitus ([place]) 04/30/2017  ? Hypothyroidism 04/29/2017  ? DJD (degenerative joint disease), lumbar 04/29/2017  ? Obesity 02/15/2017  ? Stenosis of intervertebral foramina 08/08/2016  ? History of laryngeal cancer 06/09/2016  ? S/P coronary artery stent placement 05/30/2016  ? Positive urine drug screen 05/30/2016  ? Tachycardia 05/26/2015  ? Syncope 05/26/2015  ? ICD (implantable cardioverter-defibrillator), biventricular, in situ 05/26/2015  ? Chronic kidney disease, stage III (moderate) ([place]) 04/10/2015  ? Esophagitis, reflux 04/10/2015  ? Vitamin D deficiency 04/10/2015  ? Cough 03/10/2015  ? Chronic pain   ? Chronic systolic CHF (congestive heart failure) ([place])   ? CAD S/P percutaneous coronary angioplasty   ? Acute back pain with sciatica 07/01/2014  ? Dyspnea on exertion   ? Chest pain 05/01/2013  ? Dyslipidemia, goal LDL below 70 12/26/2012  ? Tobacco abuse 12/26/2012  ? LBBB (left bundle branch block) 12/26/2012  ? Type 2 diabetes mellitus with stage 3 chronic kidney disease, with long-term current use of insulin ([place]) 12/26/2012  ? Essential hypertension 10/03/2008  ? SLEEP APNEA 10/03/2008  ? Chronic obstructive pulmonary disease ([place]) 10/03/2008  ? ? ?  Past Medical History:  ?Diagnosis Date  ? Anxiety   ? Arthritis   ? Chronic pain   ? a. Prior h/o chronic pain on methadone.  ? Chronic systolic CHF (congestive heart failure) ([place])   ? a. mixed ischemic/non-ischemic  cardiomyopathy. b. s/p CRT-D in 06/2014.  ? CKD (chronic kidney disease), stage III ([place])   ? Complication of anesthesia   ? hard time waking her up from general surgery  ? Coronary artery disease   ? a. remote RCA stenting in 2008 with non-DES. b. Cath 06/2014 following abnormal nuc: Stable, unchanged from prior cath, patent stent and 40% LM.  ? Diabetes mellitus ([place])   ? GERD (gastroesophageal reflux disease)   ? Headache   ? Hyperlipidemia   ? Hypertension   ? LBBB (left bundle branch block)   ? Nonischemic cardiomyopathy ([place])   ? OSA (obstructive sleep apnea)   ? AHI-9.77/hr, during REM-50.32/hr  ? Tobacco abuse   ? ? ?Current Outpatient Medications  ?Medication Sig Dispense Refill  ? Accu-Chek Softclix Lancets lancets Use 1-4 times daily as directed.  DX E11.9 360 each 3  ? aspirin EC 81 MG tablet Take 81 mg by mouth at bedtime.    ? blood glucose meter kit and supplies KIT Dispense based on patient and insurance preference. Use three times a day as directed. Dx. E11.65, Z79.4 1 each 11  ? blood glucose meter kit and supplies Dispense based on patient and insurance preference. Use up to four times daily as directed. (FOR ICD-10 E10.9, E11.9). 1 each 0  ? Blood Glucose Monitoring Suppl (ACCU-CHEK GUIDE) w/Device KIT 1 Device by Does not apply route 4 (four) times daily. 1 kit 0  ? carvedilol (COREG) 12.5 MG tablet TAKE 1 AND 1/2 TABLETS TWICE DAILY 270 tablet 0  ? clopidogrel (PLAVIX) 75 MG tablet TAKE 1 TABLET EVERY DAY 90 tablet 1  ? furosemide (LASIX) 40 MG tablet TAKE 2 TABLETS ONE TIME DAILY IN THE MORNING AND TAKE 1 TABLET EVERY EVENING 270 tablet 1  ? Garlic 654 MG CAPS Take 500 mg by mouth daily.    ? glucose blood (ACCU-CHEK GUIDE) test strip Use 1-4 times daily as directed.  DX E11.9 400 each 3  ? insulin detemir (LEVEMIR FLEXTOUCH) 100 UNIT/ML FlexPen Injects 16 units subcutaneously once daily 15 mL 1  ? insulin detemir (LEVEMIR) 100 UNIT/ML injection Inject 16 units subcutaneously once daily 10 mL 2   ? Insulin Pen Needle (BD PEN NEEDLE MICRO U/F) 32G X 6 MM MISC USE NEW NEEDLE FOR EACH INJECTION OF INSULIN, FOUR TIMES DAILY 100 each 5  ? ipratropium (ATROVENT) 0.03 % nasal spray Place 2 sprays into both nostrils 2 (two) times daily. 30 mL 3  ? ketoconazole (NIZORAL) 2 % cream Apply 1 application topically daily. 60 g 0  ? levothyroxine (SYNTHROID) 88 MCG tablet TAKE 1 TABLET AT BEDTIME 90 tablet 0  ? mupirocin ointment (BACTROBAN) 2 % Apply 1 application topically 3 (three) times daily. 22 g 0  ? nitroGLYCERIN (NITROSTAT) 0.4 MG SL tablet Place 1 tablet (0.4 mg total) under the tongue every 5 (five) minutes as needed for chest pain. 25 tablet 3  ? NONFORMULARY OR COMPOUNDED ITEM Pain cream : ketamine 5%, baclofen 2%, gabapentin 5%, lidocaine 5%, menthol 1% ?[facility]    ? oxyCODONE-acetaminophen (PERCOCET/ROXICET) 5-325 MG tablet Take 1 tablet by mouth 2 (two) times daily as needed for severe pain.    ? potassium chloride SA (  KLOR-CON M) 20 MEQ tablet TAKE 1 TABLET TWICE DAILY ON MONDAY, WEDNESDAY AND FRIDAY (DAYS YOU TAKE LASIX/FUROSEMIDE) 90 tablet 2  ? rOPINIRole (REQUIP) 0.5 MG tablet TAKE 1 TABLET AT BEDTIME 90 tablet 0  ? simvastatin (ZOCOR) 20 MG tablet TAKE 1 TABLET BY MOUTH ONCE DAILY WITH SUPPER 90 tablet 3  ? traZODone (DESYREL) 100 MG tablet Take 1 tablet (100 mg total) by mouth at bedtime. 90 tablet 3  ? triamcinolone cream (KENALOG) 0.1 % APPLY ONE APPLICATION TOPICALLY TWO TIMES DAILY (Patient taking differently: Apply 1 application. topically 2 (two) times daily.) 30 g 0  ? ?No current facility-administered medications for this visit.  ? ? ?Allergies  ?Allergen Reactions  ? Valium [Diazepam] Swelling  ?  face  ? ? ?Social History  ? ?Socioeconomic History  ? Marital status: Widowed  ?  Spouse name: Not on file  ? Number of children: Not on file  ? Years of education: Not on file  ? Highest education level: Not on file  ?Occupational History  ? Not on file  ?Tobacco Use   ? Smoking status: Every Day  ?  Packs/day: 2.00  ?  Years: 58.00  ?  Pack years: 116.00  ?  Types: Cigarettes  ?  Start date: 12/26/1951  ?  Last attempt to quit: 12/05/2013  ?  Years since quitting: 7.5  ? Smokeless tob

## 2021-06-17 LAB — CUP PACEART REMOTE DEVICE CHECK
Battery Remaining Longevity: 17 mo
Battery Remaining Percentage: 18 %
Battery Voltage: 2.75 V
Brady Statistic AP VP Percent: 14 %
Brady Statistic AP VS Percent: 1 %
Brady Statistic AS VP Percent: 86 %
Brady Statistic AS VS Percent: 1 %
Brady Statistic RA Percent Paced: 13 %
Date Time Interrogation Session: 20230412020015
HighPow Impedance: 72 Ohm
HighPow Impedance: 72 Ohm
Implantable Lead Implant Date: 20160406
Implantable Lead Implant Date: 20160406
Implantable Lead Implant Date: 20160406
Implantable Lead Location: 753858
Implantable Lead Location: 753859
Implantable Lead Location: 753860
Implantable Lead Model: 7122
Implantable Pulse Generator Implant Date: 20160406
Lead Channel Impedance Value: 1300 Ohm
Lead Channel Impedance Value: 350 Ohm
Lead Channel Impedance Value: 660 Ohm
Lead Channel Pacing Threshold Amplitude: 0.75 V
Lead Channel Pacing Threshold Amplitude: 0.875 V
Lead Channel Pacing Threshold Amplitude: 0.875 V
Lead Channel Pacing Threshold Pulse Width: 0.5 ms
Lead Channel Pacing Threshold Pulse Width: 0.5 ms
Lead Channel Pacing Threshold Pulse Width: 0.5 ms
Lead Channel Sensing Intrinsic Amplitude: 12 mV
Lead Channel Sensing Intrinsic Amplitude: 5 mV
Lead Channel Setting Pacing Amplitude: 1.875
Lead Channel Setting Pacing Amplitude: 2 V
Lead Channel Setting Pacing Amplitude: 2 V
Lead Channel Setting Pacing Pulse Width: 0.5 ms
Lead Channel Setting Pacing Pulse Width: 0.5 ms
Lead Channel Setting Sensing Sensitivity: 0.5 mV
Pulse Gen Serial Number: 7199559

## 2021-06-19 ENCOUNTER — Other Ambulatory Visit: Payer: Self-pay | Admitting: Family Medicine

## 2021-06-19 DIAGNOSIS — E119 Type 2 diabetes mellitus without complications: Secondary | ICD-10-CM

## 2021-06-19 DIAGNOSIS — E785 Hyperlipidemia, unspecified: Secondary | ICD-10-CM

## 2021-06-22 ENCOUNTER — Ambulatory Visit (INDEPENDENT_AMBULATORY_CARE_PROVIDER_SITE_OTHER): Payer: Medicare HMO

## 2021-06-22 DIAGNOSIS — I5022 Chronic systolic (congestive) heart failure: Secondary | ICD-10-CM

## 2021-06-22 DIAGNOSIS — Z95810 Presence of automatic (implantable) cardiac defibrillator: Secondary | ICD-10-CM | POA: Diagnosis not present

## 2021-06-22 NOTE — Progress Notes (Signed)
EPIC Encounter for ICM Monitoring ? ?Patient Name: [name] is a 81 y.o. female ?Date: 06/22/2021 ?Primary Care Physican: Maximiano Coss, NP ?Primary Cardiologist: Gwenlyn Found ?Electrophysiologist: Caryl Comes ?Bi-V Pacing:  >99%           ?05/14/2021 Weight: 146-149 lbs                           ?  ?Spoke with patient and heart failure questions reviewed.  Pt asymptomatic for fluid accumulation.  Reports feeling well at this time and voices no complaints.   ?  ?[place] thoracic impedance suggesting normal fluid levels. ?  ?Prescribed:  ?Furosemide 40 mg on take 2 tablets (80 mg total) every morning and 1 tablet (40 mg total) every evening.   ?Potassium 20 mEq On Monday, Wednesday AND Friday TAKE 1 TAB BY MOUTH 2 TIMES A DAY and on the days you take lasix. ?  ?Labs: ?06/01/2021 Creatinine 1.46, BUN 18, Potassium 3.8, Sodium 139, GFR 34.01 ?A complete set of results can be found in Results Review. ?  ?Recommendations:  No changes and encouraged to call if experiencing any fluid symptoms. ?  ?Follow-up plan: ICM clinic phone appointment on 07/26/2021.   91 day device clinic remote transmission 09/15/2021.   ?  ?EP/Cardiology Office Visits:  06/23/2021 with Dr Gwenlyn Found. ?  ?Copy of ICM check sent to Dr. Caryl Comes.  ?  ?3 month ICM trend: 06/22/2021. ? ? ? ?12-14 Month ICM trend:  ? ? ? ?[name], RN ?06/22/2021 ?7:51 AM ? ?

## 2021-06-23 ENCOUNTER — Ambulatory Visit: Payer: Medicare HMO | Admitting: Cardiovascular Disease

## 2021-06-23 ENCOUNTER — Encounter: Payer: Self-pay | Admitting: Cardiovascular Disease

## 2021-06-23 DIAGNOSIS — I10 Essential (primary) hypertension: Secondary | ICD-10-CM | POA: Diagnosis not present

## 2021-06-23 DIAGNOSIS — I428 Other cardiomyopathies: Secondary | ICD-10-CM | POA: Diagnosis not present

## 2021-06-23 DIAGNOSIS — I2510 Atherosclerotic heart disease of native coronary artery without angina pectoris: Secondary | ICD-10-CM | POA: Diagnosis not present

## 2021-06-23 DIAGNOSIS — Z720 Tobacco use: Secondary | ICD-10-CM

## 2021-06-23 DIAGNOSIS — Z9861 Coronary angioplasty status: Secondary | ICD-10-CM | POA: Diagnosis not present

## 2021-06-23 DIAGNOSIS — E785 Hyperlipidemia, unspecified: Secondary | ICD-10-CM | POA: Diagnosis not present

## 2021-06-23 DIAGNOSIS — R0989 Other specified symptoms and signs involving the circulatory and respiratory systems: Secondary | ICD-10-CM | POA: Diagnosis not present

## 2021-06-23 DIAGNOSIS — I5022 Chronic systolic (congestive) heart failure: Secondary | ICD-10-CM

## 2021-06-23 NOTE — Assessment & Plan Note (Signed)
History of systolic heart failure in the past with an EF in the 20% range status post CRT therapy implanted by Dr. Andrena Mews with subsequent echo performed 10/22/2015 revealing normalization of LV function with an EF of 50 to 55%.  Her MR also decreased to only trivial.  Her OptiVol measurements have been good.  She denies symptoms of congestive heart failure. ?

## 2021-06-23 NOTE — Assessment & Plan Note (Signed)
Ongoing tobacco abuse of 2 packs a day down to 1/2 pack/day over the last month. ?

## 2021-06-23 NOTE — Patient Instructions (Signed)
Medication Instructions:  ?Your physician recommends that you continue on your current medications as directed. Please refer to the Current Medication list given to you today. ? ?*If you need a refill on your cardiac medications before your next appointment, please call your pharmacy* ? ? ?Testing/Procedures: ?Your physician has requested that you have a carotid duplex. This test is an ultrasound of the carotid arteries in your neck. It looks at blood flow through these arteries that supply the brain with blood. Allow one hour for this exam. There are no restrictions or special instructions. To do in Feb 2024. This procedure will be done at [place]. [address] ? ? ?Follow-Up: ?At [facility], you and your health needs are our priority.  As part of our continuing mission to provide you with exceptional heart care, we have created designated Provider Care Teams.  These Care Teams include your primary Cardiologist (physician) and Advanced Practice Providers (APPs -  Physician Assistants and Nurse Practitioners) who all work together to provide you with the care you need, when you need it. ? ?We recommend signing up for the patient portal called "MyChart".  Sign up information is provided on this After Visit Summary.  MyChart is used to connect with patients for Virtual Visits (Telemedicine).  Patients are able to view lab/test results, encounter notes, upcoming appointments, etc.  Non-urgent messages can be sent to your provider as well.   ?To learn more about what you can do with MyChart, go to [email or web address].   ? ?Your next appointment:   ?6 month(s) ? ?The format for your next appointment:   ?In Person ? ?Provider:   ?Coletta Memos, FNP, Fabian Sharp, PA-C, Sande Rives, PA-C, Caron Presume, PA-C, Jory Sims, DNP, ANP, Almyra Deforest, PA-C, or Diona Browner, NP     ? ? ?Then, Quay Burow, MD will plan to see you again in 12 month(s). ?

## 2021-06-23 NOTE — Assessment & Plan Note (Signed)
History of carotid artery disease with moderately severe left common carotid artery stenosis by duplex ultrasound as well as CTA performed 05/30/2019.  Her most recent Doppler study performed 2 months ago showed minimal changes.  This will be repeated on an annual basis. ?

## 2021-06-23 NOTE — Assessment & Plan Note (Signed)
History of nonischemic cardiomyopathy with an EF of 20% back in 2016 status post biventricular ICD implantation by Dr. Caryl Comes with resultant increase in her EF to 50 to 55% back in 2017.  She is on low-dose carvedilol but cannot tolerate ACE/ARB because of hypotension. ?

## 2021-06-23 NOTE — Progress Notes (Signed)
? ? ? ?06/23/2021 ?[name]   ?07-15-1940  ?[id number] ? ?Primary Physician Maximiano Coss, NP ?Primary Cardiologist: Lorretta Harp MD [name], [place] ? ?HPI:  [name] is a 81 y.o.  mildly overweight married Caucasian female, mother of 17, grandmother of 2 grandchildren, whom I last saw in the office   05/14/2019 Unfortunately, there are 6 year old son committed suicide by shooting himself 2 weeks ago and they're very distraught at the current time. She has a history of CAD, status post RCA stenting in the past. She was catheterized by Dr. Janene Madeira May 04, 2007, after abnormal Myoview revealing 30% to 40% eccentric distal left main. A proximal RCA stent was patent and her EF was normal. Other problems include hypertension, hyperlipidemia, and diabetes. She has chronic left bundle branch block. She smokes 1-1/2 packs of cigarettes a day. She has had back surgery by Dr. Lissa Merlin. Myoview stress test performed March 24, 2010, revealed new anteroapical scar, and because of shortness of breath I catheterized her March 31, 2010, revealing 50% "in-stent restenosis" within the RCA stent, which was smooth, 40% distal left main with anatomy unchanged from the prior study and normal LV function. She really denies chest pain or shortness of breath. Her most recent lab work, performed by Dr. Buddy Duty in June, revealed a total cholesterol of 138, LDL of 57, HDL of 44.  ? ?I saw her one year ago. Over the last several weeks she developed new onset chest pressure occurring every other day lasting for minutes at a time associated with diaphoresis. I'm concerned that she may have progression of her left main disease or and/or her RCA in-stent restenosis. Her Myoview performed prior to her last cath in January 2012 was remarkable for anteroapical scar versus breast attenuation artifact. Based on her anatomy and her symptoms I elected to proceed with outpatient diagnostic coronary arteriography which I performed  on 12/26/12 revealing essentially unchanged anatomy. Her RCA stent was patent her left main was mild. She did have mild to moderate left ventricular dysfunction with an EF in the 45% range. 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 She gets occasional chest pain which has not changed in frequency or severity. She does complain of back pain and leg pain and is seeing Dr. Ellene Route for this. She has decreased pedal pulses on exam raising the issue of the possibility of peripheral vascular disease. Lower extremity arterial Doppler studies performed in our office 03/14/14 revealed ABIs of 1 bilaterally high-frequency signal in the left external iliac artery although her symptoms are symmetric. ?She saw Molli Hazard in the office 05/14/14 complaining of dyspnea on exertion and diaphoresis. A 2-D echo performed 05/20/14 as well as a Myoview stress test showed significant worsening of LV function with an EF 20%, a dilated left ventricle and moderate MR with a Myoview that showed scar in the anterior wall apex and septum markedly different than prior functional studies. Based on this result I performed cardiac catheterization on her 05/29/14 revealing unchanged coronary anatomy which was deemed as moderate and stable with severe LV dysfunction and elevated filling pressures. She subsequently underwent biventricular ICD implantation by Dr. Caryl Comes on 06/11/14 with an excellent clinical result which was almost immediate. She now denies chest pain or shortness of breath. Her recent 2-D echo performed 10/22/15 revealed an EF of 50-55%. ? ?Since I saw her a year ago she is remained clinically  stable.  Dr. Caryl Comes follows her biventricular ICD and last saw her in the office 09/17/2018.  Unfortunately, her husband Sherle Mello, also a patient of mine expired at home 07/05/2018 after being married 37 years.  She still visibly grieving.  Her major medical  complaints are of asymptomatic hypotension with blood pressures occasionally in the 90s.  She is been told to liberalize her salt intake and decrease her furosemide dose. ?  ?Since I saw her in the office 2 years ago she continues to do well.  She is still smoking 1/2 pack a day down from 2 packs a day.  She has some mild orthostatic symptoms but these are unchanged.  Her last 2D echo performed back in 2017 showed an EF of 50 to 55%.  She is on low-dose carvedilol. ? ?Current Meds  ?Medication Sig  ? Accu-Chek Softclix Lancets lancets Use 1-4 times daily as directed.  DX E11.9  ? aspirin EC 81 MG tablet Take 81 mg by mouth at bedtime.  ? blood glucose meter kit and supplies KIT Dispense based on patient and insurance preference. Use three times a day as directed. Dx. E11.65, Z79.4  ? blood glucose meter kit and supplies Dispense based on patient and insurance preference. Use up to four times daily as directed. (FOR ICD-10 E10.9, E11.9).  ? Blood Glucose Monitoring Suppl (ACCU-CHEK GUIDE) w/Device KIT 1 Device by Does not apply route 4 (four) times daily.  ? carvedilol (COREG) 12.5 MG tablet TAKE 1 AND 1/2 TABLETS TWICE DAILY  ? clopidogrel (PLAVIX) 75 MG tablet TAKE 1 TABLET EVERY DAY  ? furosemide (LASIX) 40 MG tablet TAKE 2 TABLETS ONE TIME DAILY IN THE MORNING AND TAKE 1 TABLET EVERY EVENING  ? Garlic 161 MG CAPS Take 500 mg by mouth daily.  ? glucose blood (ACCU-CHEK GUIDE) test strip Use 1-4 times daily as directed.  DX E11.9  ? insulin detemir (LEVEMIR FLEXTOUCH) 100 UNIT/ML FlexPen Injects 16 units subcutaneously once daily  ? Insulin Pen Needle (BD PEN NEEDLE MICRO U/F) 32G X 6 MM MISC USE NEW NEEDLE FOR EACH INJECTION OF INSULIN, FOUR TIMES DAILY  ? ipratropium (ATROVENT) 0.03 % nasal spray Place 2 sprays into both nostrils 2 (two) times daily.  ? ketoconazole (NIZORAL) 2 % cream Apply 1 application topically daily.  ? levothyroxine (SYNTHROID) 88 MCG tablet TAKE 1 TABLET AT BEDTIME  ? mupirocin ointment  (BACTROBAN) 2 % Apply 1 application topically 3 (three) times daily.  ? nitroGLYCERIN (NITROSTAT) 0.4 MG SL tablet Place 1 tablet (0.4 mg total) under the tongue every 5 (five) minutes as needed for chest pain.  ? NONFORMULARY OR COMPOUNDED ITEM Pain cream : ketamine 5%, baclofen 2%, gabapentin 5%, lidocaine 5%, menthol 1% ?Order faxed to [facility]  ? oxyCODONE-acetaminophen (PERCOCET/ROXICET) 5-325 MG tablet Take 1 tablet by mouth 2 (two) times daily as needed for severe pain.  ? potassium chloride SA (KLOR-CON M) 20 MEQ tablet TAKE 1 TABLET TWICE DAILY ON MONDAY, WEDNESDAY AND FRIDAY (DAYS YOU TAKE LASIX/FUROSEMIDE)  ? simvastatin (ZOCOR) 20 MG tablet TAKE 1 TABLET BY MOUTH ONCE DAILY WITH SUPPER  ? traZODone (DESYREL) 100 MG tablet Take 1 tablet (100 mg total) by mouth at bedtime.  ? triamcinolone cream (KENALOG) 0.1 % APPLY ONE APPLICATION TOPICALLY TWO TIMES DAILY (Patient taking differently: Apply 1 application. topically 2 (two) times daily.)  ? [DISCONTINUED] insulin detemir (LEVEMIR) 100 UNIT/ML injection Inject 16 units subcutaneously once daily  ? [DISCONTINUED] rOPINIRole (REQUIP) 0.5 MG tablet TAKE  1 TABLET AT BEDTIME  ?  ? ?Allergies  ?Allergen Reactions  ? Valium [Diazepam] Swelling  ?  face  ? ? ?Social History  ? ?Socioeconomic History  ? Marital status: Widowed  ?  Spouse name: Not on file  ? Number of children: Not on file  ? Years of education: Not on file  ? Highest education level: Not on file  ?Occupational History  ? Not on file  ?Tobacco Use  ? Smoking status: Every Day  ?  Packs/day: 2.00  ?  Years: 58.00  ?  Pack years: 116.00  ?  Types: Cigarettes  ?  Start date: 12/26/1952  ?  Last attempt to quit: 12/05/2013  ?  Years since quitting: 7.5  ? Smokeless tobacco: Never  ? Tobacco comments:  ?  uses Vape cigarettes  ?Vaping Use  ? Vaping Use: Never used  ?Substance and Sexual Activity  ? Alcohol use: No  ?  Alcohol/week: 0.0 standard drinks  ? Drug use: No  ? Sexual activity: Not on  file  ?Other Topics Concern  ? Not on file  ?Social History Narrative  ? Patient lives in [place] with his husband and grandson.  ? Son completed suicide on September 17th of this year, patient found hi

## 2021-06-23 NOTE — Assessment & Plan Note (Signed)
History of essential hypertension a blood pressure measured today at 100/50.  She is on low-dose carvedilol. ?

## 2021-06-23 NOTE — Assessment & Plan Note (Signed)
History of CAD status post remote RCA stenting in 2008 with multiple catheterizations performed since that time revealing a patent stent with mild distal left main disease.  She denies chest pain. ?

## 2021-06-23 NOTE — Assessment & Plan Note (Signed)
History of dyslipidemia on statin therapy with lipid profile performed 06/01/2021 revealing total cholesterol of 140, LDL 70 and HDL of 53. ?

## 2021-06-28 ENCOUNTER — Other Ambulatory Visit: Payer: Self-pay | Admitting: Registered Nurse

## 2021-06-28 DIAGNOSIS — E039 Hypothyroidism, unspecified: Secondary | ICD-10-CM

## 2021-06-30 ENCOUNTER — Other Ambulatory Visit: Payer: Self-pay | Admitting: Internal Medicine

## 2021-07-02 ENCOUNTER — Ambulatory Visit: Payer: Medicare HMO

## 2021-07-02 NOTE — Progress Notes (Signed)
Remote ICD transmission.   

## 2021-07-13 ENCOUNTER — Other Ambulatory Visit: Payer: Self-pay | Admitting: Registered Nurse

## 2021-07-13 DIAGNOSIS — E119 Type 2 diabetes mellitus without complications: Secondary | ICD-10-CM

## 2021-07-26 ENCOUNTER — Ambulatory Visit (INDEPENDENT_AMBULATORY_CARE_PROVIDER_SITE_OTHER): Payer: Medicare HMO

## 2021-07-26 DIAGNOSIS — Z95810 Presence of automatic (implantable) cardiac defibrillator: Secondary | ICD-10-CM | POA: Diagnosis not present

## 2021-07-26 DIAGNOSIS — I5022 Chronic systolic (congestive) heart failure: Secondary | ICD-10-CM | POA: Diagnosis not present

## 2021-07-28 NOTE — Progress Notes (Signed)
EPIC Encounter for ICM Monitoring  Patient Name: [name] is a 81 y.o. female Date: 07/28/2021 Primary Care Physican: Maximiano Coss, NP Primary Cardiologist: Gwenlyn Found Electrophysiologist: Vergie Living Pacing:  >99%           07/28/2021 Weight: 143-144 lbs                             Spoke with patient and heart failure questions reviewed.  Pt asymptomatic for fluid accumulation.  Reports feeling well at this time and voices no complaints.     Corvue thoracic impedance suggesting normal fluid levels.   Prescribed:  Furosemide 40 mg on take 2 tablets (80 mg total) every morning and 1 tablet (40 mg total) every evening.   Potassium 20 mEq On Monday, Wednesday AND Friday TAKE 1 TAB BY MOUTH 2 TIMES A DAY and on the days you take lasix.   Labs: 06/01/2021 Creatinine 1.46, BUN 18, Potassium 3.8, Sodium 139, GFR 34.01 A complete set of results can be found in Results Review.   Recommendations:  No changes and encouraged to call if experiencing any fluid symptoms.   Follow-up plan: ICM clinic phone appointment on 08/30/2021.   91 day device clinic remote transmission 09/15/2021.     EP/Cardiology Office Visits:    Recall 12/08/2021 with Dr Caryl Comes.   Recall 06/18/2022 with Dr Gwenlyn Found.   Copy of ICM check sent to Dr. Caryl Comes.   3 month ICM trend: 07/26/2021.    12-14 Month ICM trend:     [name], RN 07/28/2021 4:39 PM

## 2021-07-30 ENCOUNTER — Other Ambulatory Visit: Payer: Self-pay | Admitting: Registered Nurse

## 2021-07-30 ENCOUNTER — Telehealth: Payer: Self-pay | Admitting: Acute Care

## 2021-07-30 DIAGNOSIS — F17210 Nicotine dependence, cigarettes, uncomplicated: Secondary | ICD-10-CM

## 2021-07-30 NOTE — Telephone Encounter (Signed)
Spoke with patient regarding LCS referral.  Patient is age 81 and will turn age 61 years in August of 2023. She is not eligible for LCS due to age.  She is interested in having a lung scan.  Advised a message will be routed to provider to consider alternative to LCS LDCT such as a CT chest without contrast.  We will not follow this patient due to ineligible.  Patient acknowledged understanding and referral closed.  This message routed to PCP/referring provider for update and further recommendations for patient.

## 2021-07-30 NOTE — Telephone Encounter (Signed)
I have placed an order since she will not be followed by specialist team  She will get a call to schedule  Thanks  Rich

## 2021-08-05 NOTE — Telephone Encounter (Signed)
Patient was made aware and she states she has already scheduled the appointment for this lung scan.

## 2021-08-11 ENCOUNTER — Other Ambulatory Visit: Payer: Self-pay | Admitting: Podiatry

## 2021-08-11 ENCOUNTER — Telehealth: Payer: Self-pay | Admitting: Podiatry

## 2021-08-11 MED ORDER — KETOCONAZOLE 2 % EX CREA: 1.0000 "application " | TOPICAL_CREAM | Freq: Every day | CUTANEOUS | 0 refills | Status: DC

## 2021-08-11 NOTE — Telephone Encounter (Signed)
Pt would like refill of   ketoconazole (NIZORAL) 2 % cream 60 g 0 09/08/2020    Sig - Route: Apply 1 application topically daily. - Topical     [facility], [place] - [address]  Please advise.

## 2021-08-11 NOTE — Telephone Encounter (Signed)
Patient notified

## 2021-08-15 ENCOUNTER — Other Ambulatory Visit: Payer: Self-pay | Admitting: Cardiovascular Disease

## 2021-08-15 DIAGNOSIS — I2510 Atherosclerotic heart disease of native coronary artery without angina pectoris: Secondary | ICD-10-CM

## 2021-08-30 ENCOUNTER — Ambulatory Visit (INDEPENDENT_AMBULATORY_CARE_PROVIDER_SITE_OTHER): Payer: Medicare HMO

## 2021-08-30 DIAGNOSIS — Z95810 Presence of automatic (implantable) cardiac defibrillator: Secondary | ICD-10-CM | POA: Diagnosis not present

## 2021-08-30 DIAGNOSIS — I5022 Chronic systolic (congestive) heart failure: Secondary | ICD-10-CM

## 2021-09-02 ENCOUNTER — Other Ambulatory Visit: Payer: Self-pay | Admitting: Registered Nurse

## 2021-09-02 ENCOUNTER — Telehealth: Payer: Self-pay | Admitting: Cardiovascular Disease

## 2021-09-02 DIAGNOSIS — E119 Type 2 diabetes mellitus without complications: Secondary | ICD-10-CM

## 2021-09-02 NOTE — Telephone Encounter (Signed)
Returned call to ms Demorest she states that she is very concerned about her daughter she is 81 yo. She had a CABG x4 in October and she was told that 3of the areas have failed and "nothing can be done" for her. Ms Lofgren does not believe this, that nothing can be done.  Daughter sees [facility] cardiology, walter harold there. I told her to have daughter to get referral to see Dr Gwenlyn Found. She states that daughter will not do this. She states that  she "just wants to know if this is possible" and to speak with Dr about this. Informed her that Dr Gwenlyn Found is not in the office and will not return her call today.

## 2021-09-02 NOTE — Telephone Encounter (Signed)
Would like for Dr. Gwenlyn Found to give her a call back. She wants to get his advice on something, she states that she trust Dr. Gwenlyn Found. Please advise

## 2021-09-03 ENCOUNTER — Telehealth: Payer: Self-pay

## 2021-09-03 NOTE — Progress Notes (Signed)
EPIC Encounter for ICM Monitoring  Patient Name: [name] is a 81 y.o. female Date: 09/03/2021 Primary Care Physican: Maximiano Coss, NP Primary Cardiologist: Gwenlyn Found Electrophysiologist: Vergie Living Pacing:  >99%           07/28/2021 Weight: 143-144 lbs                             Attempted call to patient and unable to reach.  Transmission reviewed.    Corvue thoracic impedance suggesting normal fluid levels.   Prescribed:  Furosemide 40 mg on take 2 tablets (80 mg total) every morning and 1 tablet (40 mg total) every evening.   Potassium 20 mEq On Monday, Wednesday AND Friday TAKE 1 TAB BY MOUTH 2 TIMES A DAY and on the days you take lasix.   Labs: 06/01/2021 Creatinine 1.46, BUN 18, Potassium 3.8, Sodium 139, GFR 34.01 A complete set of results can be found in Results Review.   Recommendations:  Unable to reach.     Follow-up plan: ICM clinic phone appointment on 10/04/2021.   91 day device clinic remote transmission 09/15/2021.     EP/Cardiology Office Visits:    Recall 12/08/2021 with Dr Caryl Comes.   Recall 06/18/2022 with Dr Gwenlyn Found.   Copy of ICM check sent to Dr. Caryl Comes.   3 month ICM trend: 08/30/2021.    12-14 Month ICM trend:     [name], RN 09/03/2021 2:51 PM

## 2021-09-03 NOTE — Telephone Encounter (Signed)
Remote ICM transmission received.  Attempted call to patient regarding ICM remote transmission and no answer.  

## 2021-09-06 NOTE — Telephone Encounter (Signed)
Left message for pt to call back  °

## 2021-09-06 NOTE — Telephone Encounter (Signed)
Pt notified of Dr Kennon Holter comment.

## 2021-09-08 ENCOUNTER — Other Ambulatory Visit: Payer: Self-pay | Admitting: Registered Nurse

## 2021-09-08 DIAGNOSIS — E119 Type 2 diabetes mellitus without complications: Secondary | ICD-10-CM

## 2021-09-15 ENCOUNTER — Ambulatory Visit (INDEPENDENT_AMBULATORY_CARE_PROVIDER_SITE_OTHER): Payer: Medicare HMO

## 2021-09-15 DIAGNOSIS — I428 Other cardiomyopathies: Secondary | ICD-10-CM | POA: Diagnosis not present

## 2021-09-15 LAB — CUP PACEART REMOTE DEVICE CHECK
Battery Remaining Longevity: 12 mo
Battery Remaining Percentage: 13 %
Battery Voltage: 2.71 V
Brady Statistic AP VP Percent: 15 %
Brady Statistic AP VS Percent: 1 %
Brady Statistic AS VP Percent: 85 %
Brady Statistic AS VS Percent: 1 %
Brady Statistic RA Percent Paced: 14 %
Date Time Interrogation Session: 20230712051605
HighPow Impedance: 71 Ohm
HighPow Impedance: 71 Ohm
Implantable Lead Implant Date: 20160406
Implantable Lead Implant Date: 20160406
Implantable Lead Implant Date: 20160406
Implantable Lead Location: 753858
Implantable Lead Location: 753859
Implantable Lead Location: 753860
Implantable Lead Model: 7122
Implantable Pulse Generator Implant Date: 20160406
Lead Channel Impedance Value: 1375 Ohm
Lead Channel Impedance Value: 350 Ohm
Lead Channel Impedance Value: 580 Ohm
Lead Channel Pacing Threshold Amplitude: 0.75 V
Lead Channel Pacing Threshold Amplitude: 0.875 V
Lead Channel Pacing Threshold Amplitude: 0.875 V
Lead Channel Pacing Threshold Pulse Width: 0.5 ms
Lead Channel Pacing Threshold Pulse Width: 0.5 ms
Lead Channel Pacing Threshold Pulse Width: 0.5 ms
Lead Channel Sensing Intrinsic Amplitude: 12 mV
Lead Channel Sensing Intrinsic Amplitude: 5 mV
Lead Channel Setting Pacing Amplitude: 1.875
Lead Channel Setting Pacing Amplitude: 2 V
Lead Channel Setting Pacing Amplitude: 2 V
Lead Channel Setting Pacing Pulse Width: 0.5 ms
Lead Channel Setting Pacing Pulse Width: 0.5 ms
Lead Channel Setting Sensing Sensitivity: 0.5 mV
Pulse Gen Serial Number: 7199559

## 2021-09-19 ENCOUNTER — Other Ambulatory Visit: Payer: Self-pay | Admitting: Registered Nurse

## 2021-09-19 DIAGNOSIS — G479 Sleep disorder, unspecified: Secondary | ICD-10-CM

## 2021-09-19 DIAGNOSIS — E119 Type 2 diabetes mellitus without complications: Secondary | ICD-10-CM

## 2021-09-20 ENCOUNTER — Other Ambulatory Visit: Payer: Self-pay | Admitting: Registered Nurse

## 2021-09-20 DIAGNOSIS — E119 Type 2 diabetes mellitus without complications: Secondary | ICD-10-CM

## 2021-09-22 ENCOUNTER — Other Ambulatory Visit: Payer: Self-pay | Admitting: Registered Nurse

## 2021-09-22 DIAGNOSIS — E119 Type 2 diabetes mellitus without complications: Secondary | ICD-10-CM

## 2021-09-22 MED ORDER — ACCU-CHEK GUIDE VI STRP: ORAL_STRIP | 11 refills | Status: DC

## 2021-10-01 DIAGNOSIS — G894 Chronic pain syndrome: Secondary | ICD-10-CM | POA: Diagnosis not present

## 2021-10-01 DIAGNOSIS — F1190 Opioid use, unspecified, uncomplicated: Secondary | ICD-10-CM | POA: Diagnosis not present

## 2021-10-04 ENCOUNTER — Ambulatory Visit (INDEPENDENT_AMBULATORY_CARE_PROVIDER_SITE_OTHER): Payer: Medicare HMO

## 2021-10-04 DIAGNOSIS — I5022 Chronic systolic (congestive) heart failure: Secondary | ICD-10-CM

## 2021-10-04 DIAGNOSIS — Z95810 Presence of automatic (implantable) cardiac defibrillator: Secondary | ICD-10-CM | POA: Diagnosis not present

## 2021-10-05 ENCOUNTER — Other Ambulatory Visit: Payer: Self-pay

## 2021-10-05 ENCOUNTER — Encounter: Payer: Self-pay | Admitting: Registered Nurse

## 2021-10-05 ENCOUNTER — Other Ambulatory Visit: Payer: Self-pay | Admitting: Registered Nurse

## 2021-10-05 ENCOUNTER — Ambulatory Visit (INDEPENDENT_AMBULATORY_CARE_PROVIDER_SITE_OTHER): Payer: Medicare HMO | Admitting: Registered Nurse

## 2021-10-05 VITALS — BP 128/66 | HR 69 | Temp 98.0°F | Resp 18 | Ht 66.0 in | Wt 145.0 lb

## 2021-10-05 DIAGNOSIS — F418 Other specified anxiety disorders: Secondary | ICD-10-CM

## 2021-10-05 DIAGNOSIS — G479 Sleep disorder, unspecified: Secondary | ICD-10-CM | POA: Diagnosis not present

## 2021-10-05 DIAGNOSIS — F17210 Nicotine dependence, cigarettes, uncomplicated: Secondary | ICD-10-CM | POA: Diagnosis not present

## 2021-10-05 DIAGNOSIS — F329 Major depressive disorder, single episode, unspecified: Secondary | ICD-10-CM | POA: Insufficient documentation

## 2021-10-05 DIAGNOSIS — Z794 Long term (current) use of insulin: Secondary | ICD-10-CM

## 2021-10-05 DIAGNOSIS — E039 Hypothyroidism, unspecified: Secondary | ICD-10-CM

## 2021-10-05 DIAGNOSIS — E2839 Other primary ovarian failure: Secondary | ICD-10-CM

## 2021-10-05 DIAGNOSIS — K56609 Unspecified intestinal obstruction, unspecified as to partial versus complete obstruction: Secondary | ICD-10-CM

## 2021-10-05 DIAGNOSIS — E119 Type 2 diabetes mellitus without complications: Secondary | ICD-10-CM | POA: Diagnosis not present

## 2021-10-05 DIAGNOSIS — R140 Abdominal distension (gaseous): Secondary | ICD-10-CM

## 2021-10-05 DIAGNOSIS — R5382 Chronic fatigue, unspecified: Secondary | ICD-10-CM | POA: Diagnosis not present

## 2021-10-05 LAB — COMPREHENSIVE METABOLIC PANEL
ALT: 11 U/L (ref 0–35)
AST: 15 U/L (ref 0–37)
Albumin: 4.2 g/dL (ref 3.5–5.2)
Alkaline Phosphatase: 79 U/L (ref 39–117)
BUN: 11 mg/dL (ref 6–23)
CO2: 32 mEq/L (ref 19–32)
Calcium: 9.8 mg/dL (ref 8.4–10.5)
Chloride: 100 mEq/L (ref 96–112)
Creatinine, Ser: 1.24 mg/dL — ABNORMAL HIGH (ref 0.40–1.20)
GFR: 41.27 mL/min — ABNORMAL LOW (ref >60.00)
Glucose, Bld: 129 mg/dL — ABNORMAL HIGH (ref 70–99)
Potassium: 3.5 mEq/L (ref 3.5–5.1)
Sodium: 140 mEq/L (ref 135–145)
Total Bilirubin: 0.3 mg/dL (ref 0.2–1.2)
Total Protein: 7.2 g/dL (ref 6.0–8.3)

## 2021-10-05 LAB — CBC WITH DIFFERENTIAL/PLATELET
Basophils Absolute: 0 10*3/uL (ref 0.0–0.1)
Basophils Relative: 0.6 % (ref 0.0–3.0)
Eosinophils Absolute: 0.2 10*3/uL (ref 0.0–0.7)
Eosinophils Relative: 3.6 % (ref 0.0–5.0)
HCT: 42 % (ref 36.0–46.0)
Hemoglobin: 14.2 g/dL (ref 12.0–15.0)
Lymphocytes Relative: 21.2 % (ref 12.0–46.0)
Lymphs Abs: 1.4 10*3/uL (ref 0.7–4.0)
MCHC: 33.8 g/dL (ref 30.0–36.0)
MCV: 94 fl (ref 78.0–100.0)
Monocytes Absolute: 0.5 10*3/uL (ref 0.1–1.0)
Monocytes Relative: 8.2 % (ref 3.0–12.0)
Neutro Abs: 4.4 10*3/uL (ref 1.4–7.7)
Neutrophils Relative %: 66.4 % (ref 43.0–77.0)
Platelets: 201 10*3/uL (ref 150.0–400.0)
RBC: 4.47 Mil/uL (ref 3.87–5.11)
RDW: 13.5 % (ref 11.5–15.5)
WBC: 6.6 10*3/uL (ref 4.0–10.5)

## 2021-10-05 LAB — TSH: TSH: 15.82 u[IU]/mL — ABNORMAL HIGH (ref 0.35–5.50)

## 2021-10-05 LAB — VITAMIN D 25 HYDROXY (VIT D DEFICIENCY, FRACTURES): VITD: 98.04 ng/mL (ref 30.00–100.00)

## 2021-10-05 LAB — LIPID PANEL
Cholesterol: 141 mg/dL (ref 0–200)
HDL: 52.2 mg/dL (ref >39.00)
LDL Cholesterol: 53 mg/dL (ref 0–99)
NonHDL: 89.09
Total CHOL/HDL Ratio: 3
Triglycerides: 179 mg/dL — ABNORMAL HIGH (ref 0.0–149.0)
VLDL: 35.8 mg/dL (ref 0.0–40.0)

## 2021-10-05 LAB — B12 AND FOLATE PANEL
Folate: 11.5 ng/mL (ref >5.9)
Vitamin B-12: 317 pg/mL (ref 211–911)

## 2021-10-05 LAB — HEMOGLOBIN A1C: Hgb A1c MFr Bld: 6.5 % (ref 4.6–6.5)

## 2021-10-05 LAB — MICROALBUMIN / CREATININE URINE RATIO
Creatinine,U: 25.4 mg/dL
Microalb Creat Ratio: 2.8 mg/g (ref 0.0–30.0)
Microalb, Ur: <0.7 mg/dL (ref 0.0–1.9)

## 2021-10-05 MED ORDER — TRAZODONE HCL 150 MG PO TABS: 150.0000 mg | ORAL_TABLET | Freq: Every day | ORAL | 3 refills | Status: DC

## 2021-10-05 MED ORDER — SERTRALINE HCL 25 MG PO TABS: 25.0000 mg | ORAL_TABLET | Freq: Every day | ORAL | 1 refills | Status: DC

## 2021-10-05 MED ORDER — LEVOTHYROXINE SODIUM 100 MCG PO TABS: 100.0000 ug | ORAL_TABLET | Freq: Every day | ORAL | 0 refills | Status: DC

## 2021-10-05 NOTE — Progress Notes (Signed)
Established Patient Office Visit  Subjective:  Patient ID: [name], female    DOB: April 12, 1940  Age: 81 y.o. MRN: [id number]  CC:  Chief Complaint  Patient presents with   Fatigue    Patient states she has been very fatigue and weak. Patient states she feels bloated and feels like something is pricking her arms for about a month and she is tired of being depressed and tired.    HPI [name] presents for fatigue.  Ongoing. Feels weak, tired. Started quite some time ago. She feels this is most likely mental health but wants to rule out physical contributors.   She does admit that she is open to addressing mental health on a more full level, open to trying SSRI.  She denies nvd, chest pain, headaches, visual changes, unexpected weight changes, joint pains.  Does endorse some bloated feeling. Some pin prick sensation in arms without change in strength or sensation otherwise. No back pain. No waking at night with pain.   Outpatient Medications Prior to Visit  Medication Sig Dispense Refill   Accu-Chek Softclix Lancets lancets TEST BLOOD SUGAR 1 TO 4 TIMES DAILY AS DIRECTED. 400 each 11   Alcohol Swabs (DROPSAFE ALCOHOL PREP) 70 % PADS USE 1 TO 4 TIMES DAILY AS DIRECTED 400 each 11   aspirin EC 81 MG tablet Take 81 mg by mouth at bedtime.     blood glucose meter kit and supplies KIT Dispense based on patient and insurance preference. Use three times a day as directed. Dx. E11.65, Z79.4 1 each 11   blood glucose meter kit and supplies Dispense based on patient and insurance preference. Use up to four times daily as directed. (FOR ICD-10 E10.9, E11.9). 1 each 0   Blood Glucose Monitoring Suppl (ACCU-CHEK GUIDE) w/Device KIT USE AS DIRECTED 1 kit 0   Blood Glucose Monitoring Suppl (TRUE METRIX AIR GLUCOSE METER) w/Device KIT USE AS DIRECTED 1 kit 0   carvedilol (COREG) 12.5 MG tablet TAKE 1 AND 1/2 TABLETS TWICE DAILY 270 tablet 0   clopidogrel (PLAVIX) 75 MG tablet TAKE 1 TABLET  EVERY DAY 90 tablet 1   furosemide (LASIX) 40 MG tablet TAKE 2 TABLETS ONE TIME DAILY IN THE MORNING AND TAKE 1 TABLET EVERY EVENING 038 tablet 1   Garlic 333 MG CAPS Take 500 mg by mouth daily.     glucose blood (ACCU-CHEK GUIDE) test strip TEST BLOOD SUGAR 1 TO 4 TIMES DAILY AS DIRECTED 350 strip 11   insulin detemir (LEVEMIR FLEXTOUCH) 100 UNIT/ML FlexPen Injects 16 units subcutaneously once daily 15 mL 1   Insulin Pen Needle (BD PEN NEEDLE MICRO U/F) 32G X 6 MM MISC USE NEW NEEDLE FOR EACH INJECTION OF INSULIN, FOUR TIMES DAILY 100 each 5   ipratropium (ATROVENT) 0.03 % nasal spray Place 2 sprays into both nostrils 2 (two) times daily. 30 mL 3   ketoconazole (NIZORAL) 2 % cream Apply 1 application. topically daily. 60 g 0   levothyroxine (SYNTHROID) 88 MCG tablet TAKE 1 TABLET AT BEDTIME 90 tablet 0   mupirocin ointment (BACTROBAN) 2 % Apply 1 application topically 3 (three) times daily. 22 g 0   nitroGLYCERIN (NITROSTAT) 0.4 MG SL tablet Place 1 tablet (0.4 mg total) under the tongue every 5 (five) minutes as needed for chest pain. 25 tablet 3   NONFORMULARY OR COMPOUNDED ITEM Pain cream : ketamine 5%, baclofen 2%, gabapentin 5%, lidocaine 5%, menthol 1% Order faxed to [facility]  oxyCODONE-acetaminophen (PERCOCET/ROXICET) 5-325 MG tablet Take 1 tablet by mouth 2 (two) times daily as needed for severe pain.     potassium chloride SA (KLOR-CON M) 20 MEQ tablet TAKE 1 TABLET TWICE DAILY ON MONDAY, WEDNESDAY AND FRIDAY (DAYS YOU TAKE LASIX/FUROSEMIDE) 90 tablet 2   simvastatin (ZOCOR) 20 MG tablet TAKE 1 TABLET BY MOUTH ONCE DAILY WITH SUPPER 90 tablet 3   triamcinolone cream (KENALOG) 0.1 % APPLY ONE APPLICATION TOPICALLY TWO TIMES DAILY (Patient taking differently: Apply 1 application  topically 2 (two) times daily.) 30 g 0   traZODone (DESYREL) 100 MG tablet TAKE 1 TABLET AT BEDTIME 90 tablet 3   No facility-administered medications prior to visit.    Review of Systems   Constitutional: Negative.   HENT: Negative.    Eyes: Negative.   Respiratory: Negative.    Cardiovascular: Negative.   Gastrointestinal: Negative.   Genitourinary: Negative.   Musculoskeletal: Negative.   Skin: Negative.   Neurological: Negative.   Psychiatric/Behavioral: Negative.    All other systems reviewed and are negative.     Objective:     BP 128/66   Pulse 69   Temp 98 F (36.7 C) (Temporal)   Resp 18   Ht _0  (1.676 m)   Wt 145 lb (65.8 kg)   SpO2 97%   BMI 23.40 kg/m   Wt Readings from Last 3 Encounters:  10/05/21 145 lb (65.8 kg)  06/23/21 142 lb (64.4 kg)  06/01/21 143 lb 9.6 oz (65.1 kg)   Physical Exam Vitals and nursing note reviewed.  Constitutional:      General: She is not in acute distress.    Appearance: Normal appearance. She is normal weight. She is not ill-appearing, toxic-appearing or diaphoretic.  Cardiovascular:     Rate and Rhythm: Normal rate and regular rhythm.     Heart sounds: Normal heart sounds. No murmur heard.    No friction rub. No gallop.  Pulmonary:     Effort: Pulmonary effort is normal. No respiratory distress.     Breath sounds: Normal breath sounds. No stridor. No wheezing, rhonchi or rales.  Chest:     Chest wall: No tenderness.  Skin:    General: Skin is warm and dry.  Neurological:     General: No focal deficit present.     Mental Status: She is alert and oriented to person, place, and time. Mental status is at baseline.  Psychiatric:        Mood and Affect: Mood normal.        Behavior: Behavior normal.        Thought Content: Thought content normal.        Judgment: Judgment normal.     Results for orders placed or performed in visit on 10/05/21  Comprehensive metabolic panel  Result Value Ref Range   Sodium 140 135 - 145 mEq/L   Potassium 3.5 3.5 - 5.1 mEq/L   Chloride 100 96 - 112 mEq/L   CO2 32 19 - 32 mEq/L   Glucose, Bld 129 (H) 70 - 99 mg/dL   BUN 11 6 - 23 mg/dL   Creatinine, Ser 1.24 (H)  0.40 - 1.20 mg/dL   Total Bilirubin 0.3 0.2 - 1.2 mg/dL   Alkaline Phosphatase 79 39 - 117 U/L   AST 15 0 - 37 U/L   ALT 11 0 - 35 U/L   Total Protein 7.2 6.0 - 8.3 g/dL   Albumin 4.2 3.5 - 5.2 g/dL   GFR  41.27 (L) >60.00 mL/min   Calcium 9.8 8.4 - 10.5 mg/dL  Hemoglobin A1c  Result Value Ref Range   Hgb A1c MFr Bld 6.5 4.6 - 6.5 %  CBC with Differential/Platelet  Result Value Ref Range   WBC 6.6 4.0 - 10.5 K/uL   RBC 4.47 3.87 - 5.11 Mil/uL   Hemoglobin 14.2 12.0 - 15.0 g/dL   HCT 42.0 36.0 - 46.0 %   MCV 94.0 78.0 - 100.0 fl   MCHC 33.8 30.0 - 36.0 g/dL   RDW 13.5 11.5 - 15.5 %   Platelets 201.0 150.0 - 400.0 K/uL   Neutrophils Relative % 66.4 43.0 - 77.0 %   Lymphocytes Relative 21.2 12.0 - 46.0 %   Monocytes Relative 8.2 3.0 - 12.0 %   Eosinophils Relative 3.6 0.0 - 5.0 %   Basophils Relative 0.6 0.0 - 3.0 %   Neutro Abs 4.4 1.4 - 7.7 K/uL   Lymphs Abs 1.4 0.7 - 4.0 K/uL   Monocytes Absolute 0.5 0.1 - 1.0 K/uL   Eosinophils Absolute 0.2 0.0 - 0.7 K/uL   Basophils Absolute 0.0 0.0 - 0.1 K/uL  Lipid panel  Result Value Ref Range   Cholesterol 141 0 - 200 mg/dL   Triglycerides 179.0 (H) 0.0 - 149.0 mg/dL   HDL 52.20 >39.00 mg/dL   VLDL 35.8 0.0 - 40.0 mg/dL   LDL Cholesterol 53 0 - 99 mg/dL   Total CHOL/HDL Ratio 3    NonHDL 89.09   TSH  Result Value Ref Range   TSH 15.82 (H) 0.35 - 5.50 uIU/mL  Microalbumin / creatinine urine ratio  Result Value Ref Range   Microalb, Ur <0.7 0.0 - 1.9 mg/dL   Creatinine,U 25.4 mg/dL   Microalb Creat Ratio 2.8 0.0 - 30.0 mg/g  Vitamin D (25 hydroxy)  Result Value Ref Range   VITD 98.04 30.00 - 100.00 ng/mL  B12 and Folate Panel  Result Value Ref Range   Vitamin B-12 317 211 - 911 pg/mL   Folate 11.5 >5.9 ng/mL      The 10-year ASCVD risk score (Arnett DK, et al., 2019) is: 62.7%    Assessment & Plan:   Problem List Items Addressed This Visit       Endocrine   Insulin dependent type 2 diabetes mellitus ([place])    Relevant Orders   Comprehensive metabolic panel (Completed)   Hemoglobin A1c (Completed)   CBC with Differential/Platelet (Completed)   Lipid panel (Completed)   Microalbumin / creatinine urine ratio (Completed)     Other   Sleep disturbance - Primary   Relevant Medications   traZODone (DESYREL) 150 MG tablet   Other Relevant Orders   B12 and Folate Panel (Completed)   Depression with anxiety   Relevant Medications   traZODone (DESYREL) 150 MG tablet   sertraline (ZOLOFT) 25 MG tablet   Other Relevant Orders   B12 and Folate Panel (Completed)   Other Visit Diagnoses     Abdominal bloating       Relevant Orders   CT Abdomen Pelvis W Contrast   Smokes less than 2 packs a day with greater than 40 pack year history       Relevant Orders   CT Abdomen Pelvis W Contrast   Estrogen deficiency       Relevant Orders   Vitamin D (25 hydroxy) (Completed)   Chronic fatigue       Relevant Orders   TSH (Completed)   Intestinal obstruction, unspecified cause, unspecified whether  partial or complete [facility])       Relevant Orders   CT Abdomen Pelvis W Contrast       Meds ordered this encounter  Medications   traZODone (DESYREL) 150 MG tablet    Sig: Take 1 tablet (150 mg total) by mouth at bedtime.    Dispense:  90 tablet    Refill:  3    Order Specific Question:   Supervising Provider    Answer:   Carlota Raspberry, JEFFREY R [[id number]]   sertraline (ZOLOFT) 25 MG tablet    Sig: Take 1 tablet (25 mg total) by mouth daily.    Dispense:  90 tablet    Refill:  1    Order Specific Question:   Supervising Provider    Answer:   Carlota Raspberry, JEFFREY R [[id number]]    Return in about 8 weeks (around 11/30/2021) for med check.   PLAN Reviewed with patient that symptoms are nonspecific. Will start on sertraline 76m po qd and med check in 8 weeks with new pcp. Collecting a number of labs - low threshold for chest/abd/pelv imaging given her extensive smoking history and nonspecific symptoms.  Pt voices  understanding and agreement to plan Patient encouraged to call clinic with any questions, comments, or concerns.   RMaximiano Coss NP

## 2021-10-05 NOTE — Patient Instructions (Signed)
Ms. Burkle -   Doristine Devoid to see you!  We're going to check on labs and imaging.  I'll call with results.  I recommend: Inda Coke, [place] Dimas Chyle, MD Berniece Pap, Md Myrna Blazer Early, NP Jeralyn Ruths, DNP   Thank you for letting me take part in your care,  Rich

## 2021-10-06 NOTE — Progress Notes (Signed)
Remote ICD transmission.   

## 2021-10-07 NOTE — Progress Notes (Signed)
EPIC Encounter for ICM Monitoring  Patient Name: [name] is a 81 y.o. female Date: 10/07/2021 Primary Care Physican: Maximiano Coss, NP Primary Cardiologist: Gwenlyn Found Electrophysiologist: Vergie Living Pacing:  >99%           07/28/2021 Weight: 143-144 lbs                             Transmission reviewed.    Corvue thoracic impedance normal but was suggesting intermittent days with possible fluid within last month.   Prescribed:  Furosemide 40 mg on take 2 tablets (80 mg total) every morning and 1 tablet (40 mg total) every evening.   Potassium 20 mEq On Monday, Wednesday AND Friday TAKE 1 TAB BY MOUTH 2 TIMES A DAY and on the days you take lasix.   Labs: 06/01/2021 Creatinine 1.46, BUN 18, Potassium 3.8, Sodium 139, GFR 34.01 A complete set of results can be found in Results Review.   Recommendations:  No changes.   Follow-up plan: ICM clinic phone appointment on 11/09/2021.   91 day device clinic remote transmission 12/15/2021.     EP/Cardiology Office Visits:    Recall 12/08/2021 with Dr Caryl Comes.   Recall 06/18/2022 with Dr Gwenlyn Found.   Copy of ICM check sent to Dr. Caryl Comes.   3 month ICM trend: 10/04/2021.    12-14 Month ICM trend:     [name], RN 10/07/2021 9:54 AM

## 2021-10-10 ENCOUNTER — Other Ambulatory Visit: Payer: Self-pay | Admitting: Registered Nurse

## 2021-10-15 ENCOUNTER — Telehealth: Payer: Self-pay | Admitting: Registered Nurse

## 2021-10-15 NOTE — Telephone Encounter (Signed)
Caller name: [name] (pt)  On DPR? :yes/no: Yes  Call back number: [phone]  Provider they see: Maximiano Coss  Reason for call:  Pt calling because she was referred to [place], but the order was not signed.  Needs to be signed before they can schedule her. Please call pt once this is done.

## 2021-10-15 NOTE — Telephone Encounter (Signed)
Both the CT abd and the lung cancer screening CT orders have been signed.  Not sure what the scheduling issue is but they are entered, signed, and appear under the imaging tab.

## 2021-10-15 NOTE — Telephone Encounter (Signed)
Could you sign the CT from [place] 10/05/21?

## 2021-11-09 ENCOUNTER — Ambulatory Visit (INDEPENDENT_AMBULATORY_CARE_PROVIDER_SITE_OTHER): Payer: Medicare HMO

## 2021-11-09 DIAGNOSIS — Z95810 Presence of automatic (implantable) cardiac defibrillator: Secondary | ICD-10-CM

## 2021-11-09 DIAGNOSIS — I5022 Chronic systolic (congestive) heart failure: Secondary | ICD-10-CM

## 2021-11-11 NOTE — Progress Notes (Signed)
EPIC Encounter for ICM Monitoring  Patient Name: [name] is a 81 y.o. female Date: 11/11/2021 Primary Care Physican: Maximiano Coss, NP Primary Cardiologist: Gwenlyn Found Electrophysiologist: Vergie Living Pacing:  >99%           07/28/2021 Weight: 143-144 lbs                             Spoke with patient and heart failure questions reviewed.  Pt asymptomatic for fluid accumulation.  Reports feeling well at this time and voices no complaints.    Corvue thoracic impedance suggesting normal fluid levels.   Prescribed:  Furosemide 40 mg on take 2 tablets (80 mg total) every morning and 1 tablet (40 mg total) every evening.   Potassium 20 mEq On Monday, Wednesday AND Friday TAKE 1 TAB BY MOUTH 2 TIMES A DAY and on the days you take lasix.   Labs: 06/01/2021 Creatinine 1.46, BUN 18, Potassium 3.8, Sodium 139, GFR 34.01 A complete set of results can be found in Results Review.   Recommendations:  No changes and encouraged to call if experiencing any fluid symptoms.   Follow-up plan: ICM clinic phone appointment on 12/20/2021.   91 day device clinic remote transmission 12/15/2021.     EP/Cardiology Office Visits:    Recall 12/08/2021 with Dr Caryl Comes.   Recall 06/18/2022 with Dr Gwenlyn Found.   Copy of ICM check sent to Dr. Caryl Comes.   3 month ICM trend: 11/09/2021.    12-14 Month ICM trend:     [name], RN 11/11/2021 10:38 AM

## 2021-11-21 ENCOUNTER — Other Ambulatory Visit: Payer: Self-pay | Admitting: Cardiovascular Disease

## 2021-12-01 ENCOUNTER — Ambulatory Visit: Payer: Medicare HMO | Admitting: Registered Nurse

## 2021-12-09 ENCOUNTER — Telehealth: Payer: Self-pay

## 2021-12-09 NOTE — Telephone Encounter (Signed)
Pt is calling requesting TOC since her provider is no longer at the practice. Pt requested Dr. Sharlet Salina as she is looking for a female MD.   I advised the pt upon approval the with Sr. Sharlet Salina she would get  a call back to set up TOC appt.  Please advise  Please route response to [employer]

## 2021-12-10 ENCOUNTER — Encounter: Payer: Self-pay | Admitting: Family Medicine

## 2021-12-10 ENCOUNTER — Ambulatory Visit (INDEPENDENT_AMBULATORY_CARE_PROVIDER_SITE_OTHER): Payer: Medicare HMO | Admitting: Family Medicine

## 2021-12-10 VITALS — BP 124/60 | HR 72 | Temp 97.1°F | Resp 17 | Ht 66.0 in | Wt 144.4 lb

## 2021-12-10 DIAGNOSIS — J42 Unspecified chronic bronchitis: Secondary | ICD-10-CM

## 2021-12-10 MED ORDER — ALBUTEROL SULFATE HFA 108 (90 BASE) MCG/ACT IN AERS: 2.0000 | INHALATION_SPRAY | Freq: Four times a day (QID) | RESPIRATORY_TRACT | 0 refills | Status: DC | PRN

## 2021-12-10 MED ORDER — DOXYCYCLINE HYCLATE 100 MG PO TABS: 100.0000 mg | ORAL_TABLET | Freq: Two times a day (BID) | ORAL | 0 refills | Status: DC

## 2021-12-10 NOTE — Patient Instructions (Addendum)
Follow up as needed START the Doxycycline twice daily- take w/ food- to treat the flare of your chronic bronchitis USE the Albuterol inhaler as needed for wheezing or shortness of breath- 2 puffs Use Robitussin or Delsym as needed for cough REST! Call with any questions or concerns Hang in there!

## 2021-12-10 NOTE — Progress Notes (Signed)
   Subjective:    Patient ID: [name], female    DOB: 1940-06-05, 81 y.o.   MRN: [id number]  HPI Cough/congestion- pt reports 'for the past 2 weeks I just want to lay on the couch'.  Says 'i cough all the time anyway' but sxs worsened 2 days ago.  'so much congestion I couldn't believe it.  Rattling, wheezing, everything'.  Pt has COPD.  Feels that this is similar to previous 'bronchitis flares'.  No fevers.  No body aches.  No HA.  No sore throat.  No known sick contacts.   Review of Systems For ROS see HPI     Objective:   Physical Exam Vitals reviewed.  Constitutional:      General: She is not in acute distress.    Appearance: Normal appearance. She is well-developed. She is not ill-appearing.     Comments: Smells of cigarettes  HENT:     Head: Normocephalic and atraumatic.     Nose: No congestion.     Comments: No TTP over frontal or maxillary sinuses Eyes:     Conjunctiva/sclera: Conjunctivae normal.     Pupils: Pupils are equal, round, and reactive to light.  Cardiovascular:     Rate and Rhythm: Normal rate and regular rhythm.     Heart sounds: Normal heart sounds. No murmur heard. Pulmonary:     Effort: Pulmonary effort is normal. No respiratory distress.     Breath sounds: No wheezing or rales.     Comments: Coarse breath sounds throughout, hacking cough Musculoskeletal:     Cervical back: Normal range of motion and neck supple.  Lymphadenopathy:     Cervical: No cervical adenopathy.  Skin:    General: Skin is warm and dry.  Neurological:     Mental Status: She is alert.           Assessment & Plan:  Chronic bronchitis- deteriorated.  Pt seems to be having a COPD flare.  COVID negative here in office.  Start Doxycycline '100mg'$  BID and add albuterol PRN.  Reviewed supportive care and red flags that should prompt return.  Pt expressed understanding and is in agreement w/ plan.

## 2021-12-14 NOTE — Telephone Encounter (Signed)
Would recommend she stay with a provider at that office if possible. If unable okay to schedule TOC but needs to keep care at current office until [facility] visit.

## 2021-12-14 NOTE — Telephone Encounter (Signed)
Please inform pt of Dr. Sharlet Salina recommendation.

## 2021-12-15 ENCOUNTER — Ambulatory Visit (INDEPENDENT_AMBULATORY_CARE_PROVIDER_SITE_OTHER): Payer: Medicare HMO

## 2021-12-15 DIAGNOSIS — I428 Other cardiomyopathies: Secondary | ICD-10-CM | POA: Diagnosis not present

## 2021-12-15 LAB — CUP PACEART REMOTE DEVICE CHECK
Battery Remaining Longevity: 9 mo
Battery Remaining Percentage: 9 %
Battery Voltage: 2.66 V
Brady Statistic AP VP Percent: 15 %
Brady Statistic AP VS Percent: 1 %
Brady Statistic AS VP Percent: 85 %
Brady Statistic AS VS Percent: 1 %
Brady Statistic RA Percent Paced: 15 %
Date Time Interrogation Session: 20231011034346
HighPow Impedance: 66 Ohm
HighPow Impedance: 66 Ohm
Implantable Lead Implant Date: 20160406
Implantable Lead Implant Date: 20160406
Implantable Lead Implant Date: 20160406
Implantable Lead Location: 753858
Implantable Lead Location: 753859
Implantable Lead Location: 753860
Implantable Lead Model: 7122
Implantable Pulse Generator Implant Date: 20160406
Lead Channel Impedance Value: 1350 Ohm
Lead Channel Impedance Value: 360 Ohm
Lead Channel Impedance Value: 560 Ohm
Lead Channel Pacing Threshold Amplitude: 0.875 V
Lead Channel Pacing Threshold Amplitude: 0.875 V
Lead Channel Pacing Threshold Amplitude: 0.875 V
Lead Channel Pacing Threshold Pulse Width: 0.5 ms
Lead Channel Pacing Threshold Pulse Width: 0.5 ms
Lead Channel Pacing Threshold Pulse Width: 0.5 ms
Lead Channel Sensing Intrinsic Amplitude: 12 mV
Lead Channel Sensing Intrinsic Amplitude: 5 mV
Lead Channel Setting Pacing Amplitude: 1.875
Lead Channel Setting Pacing Amplitude: 2 V
Lead Channel Setting Pacing Amplitude: 2 V
Lead Channel Setting Pacing Pulse Width: 0.5 ms
Lead Channel Setting Pacing Pulse Width: 0.5 ms
Lead Channel Setting Sensing Sensitivity: 0.5 mV
Pulse Gen Serial Number: 7199559

## 2021-12-17 ENCOUNTER — Other Ambulatory Visit: Payer: Self-pay

## 2021-12-17 DIAGNOSIS — E039 Hypothyroidism, unspecified: Secondary | ICD-10-CM

## 2021-12-17 MED ORDER — LEVOTHYROXINE SODIUM 100 MCG PO TABS: 100.0000 ug | ORAL_TABLET | Freq: Every day | ORAL | 0 refills | Status: DC

## 2021-12-20 ENCOUNTER — Ambulatory Visit (INDEPENDENT_AMBULATORY_CARE_PROVIDER_SITE_OTHER): Payer: Medicare HMO

## 2021-12-20 DIAGNOSIS — I5022 Chronic systolic (congestive) heart failure: Secondary | ICD-10-CM

## 2021-12-20 DIAGNOSIS — Z95810 Presence of automatic (implantable) cardiac defibrillator: Secondary | ICD-10-CM

## 2021-12-21 ENCOUNTER — Other Ambulatory Visit: Payer: Self-pay | Admitting: Cardiovascular Disease

## 2021-12-21 DIAGNOSIS — I2510 Atherosclerotic heart disease of native coronary artery without angina pectoris: Secondary | ICD-10-CM

## 2021-12-21 DIAGNOSIS — I5022 Chronic systolic (congestive) heart failure: Secondary | ICD-10-CM

## 2021-12-22 NOTE — Progress Notes (Signed)
EPIC Encounter for ICM Monitoring  Patient Name: [name] is a 81 y.o. female Date: 12/22/2021 Primary Care Physican: Maximiano Coss, NP Primary Cardiologist: Gwenlyn Found Electrophysiologist: Vergie Living Pacing:  >99%           12/22/2021 Weight: 143-144 lbs          Battery Longevity: 8.6 months                     Spoke with patient and heart failure questions reviewed.  Pt asymptomatic for fluid accumulation.  No symptoms during decreased impedance and reports no changes in diet.  Taking Furosemide as prescribed.    Corvue thoracic impedance normal but was suggesting possible fluid accumulation from 9/28-10/13.     Prescribed:  Furosemide 40 mg on take 2 tablets (80 mg total) every morning and 1 tablet (40 mg total) every evening.   Potassium 20 mEq On Monday, Wednesday AND Friday TAKE 1 TAB BY MOUTH 2 TIMES A DAY and on the days you take lasix.   Labs: 06/01/2021 Creatinine 1.46, BUN 18, Potassium 3.8, Sodium 139, GFR 34.01 A complete set of results can be found in Results Review.   Recommendations:  Recommendation to limit salt intake to 2000 mg daily and fluid intake to 64 oz daily.  Encouraged to call if experiencing any fluid symptoms.    Follow-up plan: ICM clinic phone appointment on 01/24/2022.   91 day device clinic remote transmission 03/16/2022.     EP/Cardiology Office Visits:   01/25/2022 with Dr Caryl Comes.   Recall 06/18/2022 with Dr Gwenlyn Found.     Copy of ICM check sent to Dr. Caryl Comes.   3 month ICM trend: 12/20/2021.    12-14 Month ICM trend:     [name], RN 12/22/2021 12:43 PM

## 2021-12-28 NOTE — Progress Notes (Signed)
Remote ICD transmission.   

## 2021-12-29 DIAGNOSIS — M5416 Radiculopathy, lumbar region: Secondary | ICD-10-CM | POA: Diagnosis not present

## 2021-12-29 DIAGNOSIS — G894 Chronic pain syndrome: Secondary | ICD-10-CM | POA: Diagnosis not present

## 2021-12-29 DIAGNOSIS — F1190 Opioid use, unspecified, uncomplicated: Secondary | ICD-10-CM | POA: Diagnosis not present

## 2021-12-29 DIAGNOSIS — G2581 Restless legs syndrome: Secondary | ICD-10-CM | POA: Diagnosis not present

## 2021-12-31 NOTE — Progress Notes (Deleted)
Cardiology Clinic Note   Patient Name: [name] Date of Encounter: 12/31/2021  Primary Care Provider:  Maximiano Coss, NP Primary Cardiologist:  Quay Burow, MD  Patient Profile    [name]. Waren 81 year old female presents today for follow-up evaluation of her hypertension.  Past Medical History    Past Medical History:  Diagnosis Date   Anxiety    Arthritis    Chronic pain    a. Prior h/o chronic pain on methadone.   Chronic systolic CHF (congestive heart failure) (HCC)    a. mixed ischemic/non-ischemic cardiomyopathy. b. s/p CRT-D in 06/2014.   CKD (chronic kidney disease), stage III (HCC)    Complication of anesthesia    hard time waking her up from general surgery   Coronary artery disease    a. remote RCA stenting in 2008 with non-DES. b. Cath 06/2014 following abnormal nuc: Stable, unchanged from prior cath, patent stent and 40% LM.   Diabetes mellitus (HCC)    GERD (gastroesophageal reflux disease)    Headache    Hyperlipidemia    Hypertension    LBBB (left bundle branch block)    Nonischemic cardiomyopathy (HCC)    OSA (obstructive sleep apnea)    AHI-9.77/hr, during REM-50.32/hr   Tobacco abuse    Past Surgical History:  Procedure Laterality Date   BACK SURGERY  2013   BI-VENTRICULAR IMPLANTABLE CARDIOVERTER DEFIBRILLATOR N/A 06/11/2014   STJ CRTD implanted by Dr Caryl Comes   CARDIAC CATHETERIZATION  12/14/2006   RCA stented with a 3.0 [employer] Liberte stent resulting in a reduction of 75% to 0% residual   CHOLECYSTECTOMY     20 years ago   COLONOSCOPY WITH PROPOFOL N/A 12/15/2014   Procedure: COLONOSCOPY WITH PROPOFOL;  Surgeon: Clarene Essex, MD;  Location: [facility] ENDOSCOPY;  Service: Endoscopy;  Laterality: N/A;   EYE SURGERY     bilateral cataract surgery    LEFT AND RIGHT HEART CATHETERIZATION WITH CORONARY ANGIOGRAM N/A 05/29/2014   Procedure: LEFT AND RIGHT HEART CATHETERIZATION WITH CORONARY ANGIOGRAM;  Surgeon: Lorretta Harp, MD;   Location: [facility] CATH LAB;  Service: Cardiovascular;  Laterality: N/A;   LEFT HEART CATHETERIZATION WITH CORONARY ANGIOGRAM N/A 12/27/2012   Procedure: LEFT HEART CATHETERIZATION WITH CORONARY ANGIOGRAM;  Surgeon: Lorretta Harp, MD;  Location: [facility] CATH LAB;  Service: Cardiovascular;  Laterality: N/A;    Allergies  Allergies  Allergen Reactions   Valium [Diazepam] Swelling    face    History of Present Illness    [name] has a past medical history of essential hypertension, dyslipidemia, tobacco abuse, coronary artery disease status post RCA stenting.  ICD-biventricular ICD implanted by Dr. Caryl Comes 06/11/2014, and carotid stenosis.   She underwent cardiac catheterization 2/09 after having an abnormal nuclear stress test showing 30-40% eccentric distal left main.  A proximal RCA stent was patent and her EF was normal.  She has chronic left bundle branch block.  She is a 1-1/2 pack/day smoker.  Her nuclear stress test 03/24/2010 showed new anterior apical scar.  She underwent cardiac catheterization by Dr. Gwenlyn Found 03/31/2010 that showed 50% in-stent restenosis with her RCA stent, 40% distal left main and normal LV function.  She again underwent cardiac catheterization 12/26/2012 that showed unchanged anatomy.  Her RCA stent was patent in her left main showed mild disease.  She did have mild to moderate left ventricular dysfunction with an EF of 45%.  When she was seen in follow-up in November 14 she was noted to have  occasional chest pain which had not changed in frequency or severity.  Her lower extremity arterial Dopplers on 03/14/2014 showed ABIs of 1 bilaterally high frequency signal in the left external iliac artery although her symptoms are symmetric.  She underwent echocardiogram 05/20/2014 and nuclear stress test which showed an EF of 20%, dilated left ventricular and moderate MR.  She again underwent cardiac catheterizations 05/29/2014 which showed unchanged coronary anatomy with severe LV dysfunction and  elevated filling pressures.  She underwent BiV ICD implantation by Dr. Caryl Comes on 4/16.  Her follow-up echocardiogram 10/22/2015 showed an EF of 50-55%. She was seen by Dr. Gwenlyn Found in follow-up and remained clinically stable.  She was seen in follow-up by Dr. Caryl Comes on 09/17/2018 for evaluation of her BiV ICD and was doing well at that time as well.  Her husband passed away 07-25-18.  They have been married 13 years.   She was seen by Kerin Ransom, PA-C on 05/06/2019 and was found to be hypotensive.  Her furosemide was reduced at that time.  She denied heart failure type symptoms.  She continued to smoke a pack and a half a day.  Her carotid Dopplers from 05/06/2019 showed significant left common carotid artery stenosis.   She was seen in the clinic 07/22/2019 for follow-up evaluation and stated she felt well.  She contacted her PCP due to steady weight gain after reducing furosemide.  She was  back on  80 mg furosemide morning and 40 mg evening dosing.  She had follow-up labs done which showed no increase and creatinine and stable potassium.  She stated that she still had some grief from losing her husband and her son.  However, she was now planting flowers again and enjoying being outdoors.  She was occasionally outside planting flowers for 10 hours at a time.  She had a very large 24 acre property.  She did use sodium sparingly on her food and indicated that she has switched to "no salt" seasoning.  She stated that she had tried to stop smoking several times but had been unable to quit.  I  gave her the mindfulness stress reduction sheet, the salty 6 dietary she, continued Lasix, and planned her follow-up with Dr. Gwenlyn Found in 6 months.   She presented to the clinic 04/09/20 for follow-up evaluation stated she felt well today.  She continued to have intermittent episodes of sharp chest pain.  She reported the pain lasted for seconds and dissipates on its own without intervention.  She had previously sent transmissions to EP  for evaluation.  She reported that they did not show anything with the tracings sent through her transmissions.  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  We reviewed her prior chest CT and carotid stenosis.  I gave her the heart healthy diet sheet, had her continue her physical activity as tolerated, orderwd a follow-up noncontrast chest CT, carotid Dopplers, and planned her follow-up in 6 months.  She was seen in follow-up by Dr. Gwenlyn Found on 06/23/2021.  She remained stable from a cardiac standpoint.  She continues to smoke half pack per day which was down from 2 packs/day.  She did note some mild orthostatic symptoms.  However they were unchanged.  She was continued on low-dose carvedilol.  She presents to the clinic today for follow-up evaluation and states***   Today she denies chest pain, shortness of breath, lower extremity edema, fatigue, palpitations, melena, hematuria, hemoptysis, diaphoresis, weakness, presyncope, syncope,  orthopnea, and PND.  Home Medications    Prior to Admission medications   Medication Sig Start Date End Date Taking? Authorizing Provider  Accu-Chek Softclix Lancets lancets TEST BLOOD SUGAR 1 TO 4 TIMES DAILY AS DIRECTED. 09/08/21   Maximiano Coss, NP  albuterol (VENTOLIN HFA) 108 (90 Base) MCG/ACT inhaler Inhale 2 puffs into the lungs every 6 (six) hours as needed for wheezing or shortness of breath. 12/10/21   Midge Minium, MD  Alcohol Swabs (DROPSAFE ALCOHOL PREP) 70 % PADS USE 1 TO 4 TIMES DAILY AS DIRECTED 09/08/21   Maximiano Coss, NP  aspirin EC 81 MG tablet Take 81 mg by mouth at bedtime.    [provider]  blood glucose meter kit and supplies KIT Dispense based on patient and insurance preference. Use three times a day as directed. Dx. E11.65, Z79.4 10/11/19   Daleen Squibb, MD  blood glucose meter kit and supplies Dispense based on patient and insurance preference. Use up to four times  daily as directed. (FOR ICD-10 E10.9, E11.9). 11/05/20   Maximiano Coss, NP  Blood Glucose Monitoring Suppl (ACCU-CHEK GUIDE) w/Device KIT USE AS DIRECTED 09/20/21   Maximiano Coss, NP  Blood Glucose Monitoring Suppl (TRUE METRIX AIR GLUCOSE METER) w/Device KIT USE AS DIRECTED 09/21/21   Maximiano Coss, NP  carvedilol (COREG) 12.5 MG tablet TAKE 1 AND 1/2 TABLETS TWICE DAILY 11/22/21   Lorretta Harp, MD  clopidogrel (PLAVIX) 75 MG tablet TAKE 1 TABLET EVERY DAY 08/16/21   Lorretta Harp, MD  doxycycline (VIBRA-TABS) 100 MG tablet Take 1 tablet (100 mg total) by mouth 2 (two) times daily. 12/10/21   Midge Minium, MD  furosemide (LASIX) 40 MG tablet TAKE 2 TABLETS ONE TIME DAILY IN THE MORNING AND TAKE 1 TABLET EVERY EVENING (MUST KEEP APPT FOR FUTURE REFILLS) 12/22/21   Lorretta Harp, MD  Garlic 785 MG CAPS Take 500 mg by mouth daily.    [provider]  glucose blood (ACCU-CHEK GUIDE) test strip TEST BLOOD SUGAR 1 TO 4 TIMES DAILY AS DIRECTED 09/22/21   Maximiano Coss, NP  insulin detemir (LEVEMIR FLEXTOUCH) 100 UNIT/ML FlexPen Injects 16 units subcutaneously once daily 04/27/21   Maximiano Coss, NP  Insulin Pen Needle (BD PEN NEEDLE MICRO U/F) 32G X 6 MM MISC USE NEW NEEDLE FOR EACH INJECTION OF INSULIN, FOUR TIMES DAILY 07/10/20   Maximiano Coss, NP  ipratropium (ATROVENT) 0.03 % nasal spray Place 2 sprays into both nostrils 2 (two) times daily. 11/12/20   Maximiano Coss, NP  ketoconazole (NIZORAL) 2 % cream Apply 1 application. topically daily. 08/11/21   [name], DPM  levothyroxine (SYNTHROID) 100 MCG tablet Take 1 tablet (100 mcg total) by mouth daily. 12/17/21   Midge Minium, MD  mupirocin ointment (BACTROBAN) 2 % Apply 1 application topically 3 (three) times daily. 07/29/19   Jacelyn Pi, Lilia Argue, MD  nitroGLYCERIN (NITROSTAT) 0.4 MG SL tablet Place 1 tablet (0.4 mg total) under the tongue every 5 (five) minutes as needed for chest pain. 05/06/19   Erlene Quan, PA-C  NONFORMULARY OR COMPOUNDED ITEM Pain cream : ketamine 5%, baclofen 2%, gabapentin 5%, lidocaine 5%, menthol 1% Order faxed to [facility] 02/27/20   Wallene Huh, DPM  oxyCODONE-acetaminophen (PERCOCET/ROXICET) 5-325 MG tablet Take 1 tablet by mouth 2 (two) times daily as needed for severe pain.    [provider]  potassium chloride SA (KLOR-CON M) 20 MEQ tablet TAKE 1 TABLET  TWICE DAILY ON MONDAY, WEDNESDAY AND FRIDAY (DAYS YOU TAKE LASIX/FUROSEMIDE) 03/22/21   Lorretta Harp, MD  rOPINIRole (REQUIP) 0.5 MG tablet TAKE 1 TABLET AT BEDTIME Patient not taking: Reported on 12/10/2021 10/11/21   Kennyth Arnold, FNP  sertraline (ZOLOFT) 25 MG tablet Take 1 tablet (25 mg total) by mouth daily. Patient not taking: Reported on 12/10/2021 10/05/21   Maximiano Coss, NP  simvastatin (ZOCOR) 20 MG tablet TAKE 1 TABLET BY MOUTH ONCE DAILY WITH SUPPER 06/20/21   Maximiano Coss, NP  traZODone (DESYREL) 150 MG tablet Take 1 tablet (150 mg total) by mouth at bedtime. 10/05/21   Maximiano Coss, NP  triamcinolone cream (KENALOG) 0.1 % APPLY ONE APPLICATION TOPICALLY TWO TIMES DAILY Patient taking differently: Apply 1 application  topically 2 (two) times daily. 11/28/18   Daleen Squibb, MD    Family History    Family History  Problem Relation Age of Onset   Stroke Mother    Hypertension Mother    Coronary artery disease Father    Stroke Brother    Heart disease Brother    Other Brother        H1N1 VIRUS   Healthy Sister    Healthy Sister    She indicated that her mother is deceased. She indicated that her father is deceased. She indicated that two of her five sisters are alive. She indicated that only one of her six brothers is alive. She indicated that her maternal grandmother is deceased. She indicated that her maternal grandfather is deceased. She indicated that her paternal grandmother is deceased. She indicated that her paternal grandfather is deceased. She  indicated that her son is deceased. She indicated that all of her four children are alive.  Social History    Social History   Socioeconomic History   Marital status: Widowed    Spouse name: Not on file   Number of children: Not on file   Years of education: Not on file   Highest education level: Not on file  Occupational History   Not on file  Tobacco Use   Smoking status: Every Day    Packs/day: 2.00    Years: 58.00    Total pack years: 116.00    Types: Cigarettes    Start date: 12/26/1951    Last attempt to quit: 12/05/2013    Years since quitting: 8.0   Smokeless tobacco: Never   Tobacco comments:    uses Vape cigarettes  Vaping Use   Vaping Use: Never used  Substance and Sexual Activity   Alcohol use: No    Alcohol/week: 0.0 standard drinks of alcohol   Drug use: No   Sexual activity: Not on file  Other Topics Concern   Not on file  Social History Narrative   Patient lives in [place] with his husband and grandson.   Son completed suicide on September 17th of this year, patient found him.   Social Determinants of Health   Financial Resource Strain: Not on file  Food Insecurity: Not on file  Transportation Needs: Not on file  Physical Activity: Not on file  Stress: Not on file  Social Connections: Not on file  Intimate Partner Violence: Not on file     Review of Systems    General:  No chills, fever, night sweats or weight changes.  Cardiovascular:  No chest pain, dyspnea on exertion, edema, orthopnea, palpitations, paroxysmal nocturnal dyspnea. Dermatological: No rash, lesions/masses Respiratory: No cough, dyspnea Urologic: No hematuria, dysuria Abdominal:  No nausea, vomiting, diarrhea, bright red blood per rectum, melena, or hematemesis Neurologic:  No visual changes, wkns, changes in mental status. All other systems reviewed and are otherwise negative except as noted above.  Physical Exam    VS:  There were no vitals taken for this visit. , BMI  There is no height or weight on file to calculate BMI. GEN: Well nourished, well developed, in no acute distress. HEENT: normal. Neck: Supple, no JVD, carotid bruits, or masses. Cardiac: RRR, no murmurs, rubs, or gallops. No clubbing, cyanosis, edema.  Radials/DP/PT 2+ and equal bilaterally.  Respiratory:  Respirations regular and unlabored, clear to auscultation bilaterally. GI: Soft, nontender, nondistended, BS + x 4. MS: no deformity or atrophy. Skin: warm and dry, no rash. Neuro:  Strength and sensation are intact. Psych: Normal affect.  Accessory Clinical Findings    Recent Labs: 10/05/2021: ALT 11; BUN 11; Creatinine, Ser 1.24; Hemoglobin 14.2; Platelets 201.0; Potassium 3.5; Sodium 140; TSH 15.82   Recent Lipid Panel    Component Value Date/Time   CHOL 141 10/05/2021 1001   CHOL 150 10/11/2019 1459   TRIG 179.0 (H) 10/05/2021 1001   HDL 52.20 10/05/2021 1001   HDL 53 10/11/2019 1459   CHOLHDL 3 10/05/2021 1001   VLDL 35.8 10/05/2021 1001   LDLCALC 53 10/05/2021 1001   LDLCALC 78 10/11/2019 1459    No BP recorded.  {Refresh Note OR Click here to enter BP  :1}***    ECG personally reviewed by me today- *** - No acute changes  ECG personally reviewed by me today-EKG today shows normal sinus rhythm nonspecific intraventricular block, right ventricular hypertrophy, anterior infarct undetermined age anterior lateral infarct undetermined age 73 bpm- No acute changes   Echocardiogram 10/22/2015 Study Conclusions   - Left ventricle: Systolic function was normal. The estimated    ejection fraction was in the range of 50% to 55%. Doppler    parameters are consistent with abnormal left ventricular    relaxation (grade 1 diastolic dysfunction).  - Impressions: AV optimazation with no clear change in mitral    inflow as they all showed abnormal relaxation with prominant    atrial contribution to filling    and no CO calculated at varying AV intervals   Impressions:   - AV  optimazation with no clear change in mitral inflow as they all    showed abnormal relaxation with prominant atrial contribution to    filling    and no CO calculated at varying AV intervals   ABI 02/20/2020   Summary:   Right: Resting right ankle-brachial index is within normal range. No  evidence of significant right lower extremity arterial disease. The right  toe-brachial index is abnormal.   Left: Resting left ankle-brachial index is within normal range. No  evidence of significant left lower extremity arterial disease. The left  toe-brachial index is abnormal.    CT angio neck 05/30/2019 IMPRESSION: Noncalcified plaque along the mid to distal left common carotid artery causing up to 75% stenosis. No measurable stenosis of the proximal internal carotid arteries.   Patent vertebral arteries.   Left upper lobe pulmonary nodule measuring 8 x 5 mm is new since 2015. Non-contrast chest CT at 6-12 months is recommended. If the nodule is stable at time of repeat CT, then future CT at 18-24 months (from today's scan) is considered optional for low-risk patients, but is recommended for high-risk patients. This recommendation follows the consensus statement: Guidelines for Management of Incidental Pulmonary Nodules  Detected on CT Images: From the Fleischner Society 2017; Radiology 2017; [phone].  Assessment & Plan   1.  Essential hypertension-BP today 112/58***.  Controlled at home. Continue carvedilol Heart healthy low-sodium diet Increase physical activity as tolerated   Carotid stenosis-denies lightheadedness, presyncope or syncope.  Carotid Dopplers 2023 showed bilateral 1-39% ICA stenosis. Continue simvastatin Repeat carotid Dopplers 7/35/3299   Chronic systolic CHF-NYHA class I  .  Weight stable.  Euvolemic. Continue furosemide, carvedilol Heart healthy low-sodium diet Daily weights   Dyslipidemia (goal below 70)- 10/05/2021: Cholesterol 141; HDL 52.20; LDL  Cholesterol 53; Triglycerides 179.0; VLDL 35.8 Continue simvastatin Heart healthy low-sodium high-fiber diet Increase physical activity as tolerated   Coronary artery disease-continues with occasional episodes of sharp chest pain.  EKG today shows normal sinus rhythm, nonspecific interventricular block, right ventricular hypertrophy, inferior infarct undetermined age, anterior lateral infarct undetermined age 89 bpm. Cardiac catheterization 05/29/2014 showed unchanged coronary anatomy and severe LV dysfunction. Continue current medical therapy Heart healthy low-sodium diet Increase physical activity as tolerated   ICD-BiV ICD implanted by Dr. Caryl Comes on 06/11/2014.  Follow-up echocardiogram showed an improvement in the EF to 50-55% Followed by EP   Lung nodule-8 x 5 mm left upper lobe lung nodule seen on chest CT. Repeat  chest CT 07/16/2020 showed stable findings. Follow-up with PCP   Disposition: Follow-up with Dr. Gwenlyn Found or me in 6-9 months.   [name]. Cleaver NP-C     12/31/2021, 6:54 AM [place] [place] [address] Office [phone] Fax [phone]  Notice: This dictation was prepared with Dragon dictation along with smaller phrase technology. Any transcriptional errors that result from this process are unintentional and may not be corrected upon review.  I spent***minutes examining this patient, reviewing medications, and using patient centered shared decision making involving her cardiac care.  Prior to her visit I spent greater than 20 minutes reviewing her past medical history,  medications, and prior cardiac tests.

## 2022-01-03 ENCOUNTER — Ambulatory Visit: Payer: Medicare HMO | Admitting: General Practice

## 2022-01-10 ENCOUNTER — Other Ambulatory Visit: Payer: Self-pay | Admitting: Cardiovascular Disease

## 2022-01-10 ENCOUNTER — Encounter: Payer: Self-pay | Admitting: Family Medicine

## 2022-01-10 ENCOUNTER — Ambulatory Visit (INDEPENDENT_AMBULATORY_CARE_PROVIDER_SITE_OTHER): Payer: Medicare HMO | Admitting: Family Medicine

## 2022-01-10 VITALS — BP 122/60 | HR 63 | Temp 98.9°F | Resp 18 | Ht 66.0 in | Wt 145.0 lb

## 2022-01-10 DIAGNOSIS — R309 Painful micturition, unspecified: Secondary | ICD-10-CM

## 2022-01-10 LAB — POCT URINALYSIS DIPSTICK
Glucose, UA: NEGATIVE
Protein, UA: NEGATIVE
Spec Grav, UA: 1.025 (ref 1.010–1.025)
Urobilinogen, UA: 0.2 E.U./dL
pH, UA: 7.5 (ref 5.0–8.0)

## 2022-01-10 MED ORDER — CEPHALEXIN 500 MG PO CAPS: 500.0000 mg | ORAL_CAPSULE | Freq: Two times a day (BID) | ORAL | 0 refills | Status: AC

## 2022-01-10 NOTE — Patient Instructions (Addendum)
Follow up as needed or as scheduled- needs to establish w/ Di Kindle and cancel upcoming appt w/ Dr Sharlet Salina Start the Cephalexin twice daily- take w/ food Drink LOTS of fluids to flush through your bladder Call with any questions or concerns Stay Safe!  Stay Healthy! Happy Holidays!!!

## 2022-01-10 NOTE — Progress Notes (Signed)
   Subjective:    Patient ID: [name], female    DOB: 04/08/1940, 81 y.o.   MRN: [id number]  HPI Dysuria- 'when I pee- it hurts'.  Sxs started 5 days ago.  + lower back pain.  'for 5 days I've done nothing but lay on the couch'.  'i'm not sick, but I feel really bad'.  + urinary frequency.  Urine is 'dark, foamy yellow'.  Denies suprapubic pressure.  Unsure if she is having urgency.  Some hesitancy.   Review of Systems For ROS see HPI     Objective:   Physical Exam Vitals reviewed.  Constitutional:      General: She is not in acute distress.    Appearance: Normal appearance. She is not ill-appearing.  HENT:     Head: Normocephalic and atraumatic.  Pulmonary:     Effort: Pulmonary effort is normal. No respiratory distress.  Abdominal:     General: There is no distension.     Palpations: Abdomen is soft.     Tenderness: There is no abdominal tenderness. There is no right CVA tenderness, left CVA tenderness, guarding or rebound.  Skin:    General: Skin is warm and dry.  Neurological:     General: No focal deficit present.     Mental Status: She is alert and oriented to person, place, and time.           Assessment & Plan:  Dysuria- new.  Pt's UA and sxs suspicious for infxn.  Start Keflex while awaiting culture results.  Encouraged increased fluids.  Reviewed supportive care and red flags that should prompt return.  Pt expressed understanding and is in agreement w/ plan.

## 2022-01-13 LAB — URINE CULTURE
MICRO NUMBER:: 14148939
SPECIMEN QUALITY:: ADEQUATE

## 2022-01-14 ENCOUNTER — Telehealth: Payer: Self-pay

## 2022-01-14 NOTE — Telephone Encounter (Signed)
Informed pf of lab results

## 2022-01-14 NOTE — Telephone Encounter (Signed)
-----   Message from Midge Minium, MD sent at 01/14/2022  7:22 AM EST ----- Your UTI is being treated appropriately- great news!

## 2022-01-24 ENCOUNTER — Ambulatory Visit (INDEPENDENT_AMBULATORY_CARE_PROVIDER_SITE_OTHER): Payer: Medicare HMO

## 2022-01-24 DIAGNOSIS — Z95810 Presence of automatic (implantable) cardiac defibrillator: Secondary | ICD-10-CM | POA: Diagnosis not present

## 2022-01-24 DIAGNOSIS — I5022 Chronic systolic (congestive) heart failure: Secondary | ICD-10-CM

## 2022-01-25 ENCOUNTER — Encounter: Payer: Medicare HMO | Admitting: Internal Medicine

## 2022-01-25 DIAGNOSIS — I509 Heart failure, unspecified: Secondary | ICD-10-CM | POA: Insufficient documentation

## 2022-01-25 DIAGNOSIS — I255 Ischemic cardiomyopathy: Secondary | ICD-10-CM | POA: Insufficient documentation

## 2022-01-25 DIAGNOSIS — I951 Orthostatic hypotension: Secondary | ICD-10-CM | POA: Insufficient documentation

## 2022-01-25 NOTE — Progress Notes (Signed)
EPIC Encounter for ICM Monitoring  Patient Name: [name] is a 81 y.o. female Date: 01/25/2022 Primary Care Physican: Maximiano Coss, NP Primary Cardiologist: Gwenlyn Found Electrophysiologist: Vergie Living Pacing:  >99%           12/22/2021 Weight: 143-144 lbs           Battery Longevity: 7.3 months                     Transmission reviewed.  Pt has OV for defib check 11/21 with Dr San Jetty thoracic impedance suggesting possible fluid accumulation starting 11/16 and trending close to baseline     Prescribed:  Furosemide 40 mg on take 2 tablets (80 mg total) every morning and 1 tablet (40 mg total) every evening.   Potassium 20 mEq On Monday, Wednesday AND Friday TAKE 1 TAB BY MOUTH 2 TIMES A DAY and on the days you take lasix.   Labs: 10/05/2021 Creatinine 1.24, BUN 11, Potassium 3.5, Sodium 140, GFR 41.27 06/01/2021 Creatinine 1.46, BUN 18, Potassium 3.8, Sodium 139, GFR 34.01 A complete set of results can be found in Results Review.   Recommendations:  Recommendations will be given at 11/21 OV by Dr Caryl Comes.   Follow-up plan: ICM clinic phone appointment on 03/01/2022.   91 day device clinic remote transmission 03/16/2022.     EP/Cardiology Office Visits:   01/25/2022 with Dr Caryl Comes.   Recall 06/18/2022 with Dr Gwenlyn Found.     Copy of ICM check sent to Dr. Caryl Comes.    3 month ICM trend: 01/24/2022.    12-14 Month ICM trend:     [name], RN 01/25/2022 9:04 AM

## 2022-02-03 NOTE — Progress Notes (Deleted)
Cardiology Clinic Note   Patient Name: [name] Date of Encounter: 02/03/2022  Primary Care Provider:  Maximiano Coss, NP Primary Cardiologist:  Quay Burow, MD  Patient Profile    81 year old female with history of coronary artery disease status post RCA stenting, cardiac catheterization in 2009 by Dr. Melvern Banker after abnormal Myoview revealing 30% to 40% eccentric distal left main.  The proximal RCA stent was patent and her EF was normal.  Other history includes hypertension, hyperlipidemia, and diabetes.  She has a chronic left bundle branch block.  Was seen last by Dr. Alvester Chou on 06/23/2021, unfortunately she continued to smoke.  Most recent echocardiogram in 2017 revealed an EF of 50 to 55%.  She had a history of systolic heart failure in the past with an EF of 20% status post CRT therapy implanted by Dr. Caryl Comes in 2016, with follow-up echocardiogram as above revealing normal LV dysfunction with an EF of 50 to 55%.  She was also found to have a carotid bruit and was found to have moderately severe left common carotid artery stenosis by duplex as well as CTA in 2021.  This will be repeated annually.  Past Medical History    Past Medical History:  Diagnosis Date   Anxiety    Arthritis    Chronic pain    a. Prior h/o chronic pain on methadone.   Chronic systolic CHF (congestive heart failure) (HCC)    a. mixed ischemic/non-ischemic cardiomyopathy. b. s/p CRT-D in 06/2014.   CKD (chronic kidney disease), stage III (HCC)    Complication of anesthesia    hard time waking her up from general surgery   Coronary artery disease    a. remote RCA stenting in 2008 with non-DES. b. Cath 06/2014 following abnormal nuc: Stable, unchanged from prior cath, patent stent and 40% LM.   Diabetes mellitus (HCC)    GERD (gastroesophageal reflux disease)    Headache    Hyperlipidemia    Hypertension    LBBB (left bundle branch block)    Nonischemic cardiomyopathy (HCC)    OSA (obstructive sleep apnea)     AHI-9.77/hr, during REM-50.32/hr   Tobacco abuse    Past Surgical History:  Procedure Laterality Date   BACK SURGERY  2013   BI-VENTRICULAR IMPLANTABLE CARDIOVERTER DEFIBRILLATOR N/A 06/11/2014   STJ CRTD implanted by Dr Caryl Comes   CARDIAC CATHETERIZATION  12/14/2006   RCA stented with a 3.0 [employer] Liberte stent resulting in a reduction of 75% to 0% residual   CHOLECYSTECTOMY     20 years ago   COLONOSCOPY WITH PROPOFOL N/A 12/15/2014   Procedure: COLONOSCOPY WITH PROPOFOL;  Surgeon: Clarene Essex, MD;  Location: [facility];  Service: Endoscopy;  Laterality: N/A;   EYE SURGERY     bilateral cataract surgery    LEFT AND RIGHT HEART CATHETERIZATION WITH CORONARY ANGIOGRAM N/A 05/29/2014   Procedure: LEFT AND RIGHT HEART CATHETERIZATION WITH CORONARY ANGIOGRAM;  Surgeon: Lorretta Harp, MD;  Location[facility] C[facility] LAB;  Service: Cardiovascular;  Laterality: N/A;   LEFT HEART CATHETERIZATION WITH CORONARY ANGIOGRAM N/A 12/27/2012   Procedure: LEFT HEART CATHETERIZATION WITH CORONARY ANGIOGRAM;  Surgeon: Lorretta Harp, MD;  Location: [facility] CATH LAB;  Service: Cardiovascular;  Laterality: N/A;    Allergies  Allergies  Allergen Reactions   Valium [Diazepam] Swelling    face    History of Present Illness    ***  Home Medications    Current Outpatient Medications  Medication Sig Dispense Refill  Accu-Chek Softclix Lancets lancets TEST BLOOD SUGAR 1 TO 4 TIMES DAILY AS DIRECTED. 400 each 11   albuterol (VENTOLIN HFA) 108 (90 Base) MCG/ACT inhaler Inhale 2 puffs into the lungs every 6 (six) hours as needed for wheezing or shortness of breath. 8 g 0   Alcohol Swabs (DROPSAFE ALCOHOL PREP) 70 % PADS USE 1 TO 4 TIMES DAILY AS DIRECTED 400 each 11   aspirin EC 81 MG tablet Take 81 mg by mouth at bedtime.     blood glucose meter kit and supplies KIT Dispense based on patient and insurance preference. Use three times a day as directed. Dx. E11.65, Z79.4 1 each 11   blood glucose meter  kit and supplies Dispense based on patient and insurance preference. Use up to four times daily as directed. (FOR ICD-10 E10.9, E11.9). 1 each 0   Blood Glucose Monitoring Suppl (ACCU-CHEK GUIDE) w/Device KIT USE AS DIRECTED 1 kit 0   Blood Glucose Monitoring Suppl (TRUE METRIX AIR GLUCOSE METER) w/Device KIT USE AS DIRECTED 1 kit 0   carvedilol (COREG) 12.5 MG tablet TAKE 1 AND 1/2 TABLETS TWICE DAILY 270 tablet 0   clopidogrel (PLAVIX) 75 MG tablet TAKE 1 TABLET EVERY DAY 90 tablet 1   doxycycline (VIBRA-TABS) 100 MG tablet Take 1 tablet (100 mg total) by mouth 2 (two) times daily. (Patient not taking: Reported on 01/10/2022) 20 tablet 0   furosemide (LASIX) 40 MG tablet TAKE 2 TABLETS ONE TIME DAILY IN THE MORNING AND TAKE 1 TABLET EVERY EVENING (MUST KEEP APPT FOR FUTURE REFILLS) 562 tablet 2   Garlic 130 MG CAPS Take 500 mg by mouth daily.     glucose blood (ACCU-CHEK GUIDE) test strip TEST BLOOD SUGAR 1 TO 4 TIMES DAILY AS DIRECTED 350 strip 11   insulin detemir (LEVEMIR FLEXTOUCH) 100 UNIT/ML FlexPen Injects 16 units subcutaneously once daily 15 mL 1   Insulin Pen Needle (BD PEN NEEDLE MICRO U/F) 32G X 6 MM MISC USE NEW NEEDLE FOR EACH INJECTION OF INSULIN, FOUR TIMES DAILY 100 each 5   ipratropium (ATROVENT) 0.03 % nasal spray Place 2 sprays into both nostrils 2 (two) times daily. (Patient not taking: Reported on 01/10/2022) 30 mL 3   ketoconazole (NIZORAL) 2 % cream Apply 1 application. topically daily. 60 g 0   levothyroxine (SYNTHROID) 100 MCG tablet Take 1 tablet (100 mcg total) by mouth daily. 90 tablet 0   mupirocin ointment (BACTROBAN) 2 % Apply 1 application topically 3 (three) times daily. (Patient not taking: Reported on 01/10/2022) 22 g 0   nitroGLYCERIN (NITROSTAT) 0.4 MG SL tablet Place 1 tablet (0.4 mg total) under the tongue every 5 (five) minutes as needed for chest pain. 25 tablet 3   NONFORMULARY OR COMPOUNDED ITEM Pain cream : ketamine 5%, baclofen 2%, gabapentin 5%, lidocaine  5%, menthol 1% Order faxed to [place] (PERCOCET/ROXICET) 5-325 MG tablet Take 1 tablet by mouth 2 (two) times daily as needed for severe pain.     potassium chloride SA (KLOR-CON M) 20 MEQ tablet TAKE 1 TABLET TWICE DAILY ON MONDAY, WEDNESDAY AND FRIDAY (DAYS YOU TAKE LASIX/FUROSEMIDE) 78 tablet 10   rOPINIRole (REQUIP) 0.5 MG tablet TAKE 1 TABLET AT BEDTIME (Patient not taking: Reported on 01/10/2022) 90 tablet 0   sertraline (ZOLOFT) 25 MG tablet Take 1 tablet (25 mg total) by mouth daily. 90 tablet 1   simvastatin (ZOCOR) 20 MG tablet TAKE 1 TABLET BY MOUTH ONCE DAILY WITH SUPPER  90 tablet 3   traZODone (DESYREL) 150 MG tablet Take 1 tablet (150 mg total) by mouth at bedtime. 90 tablet 3   triamcinolone cream (KENALOG) 0.1 % APPLY ONE APPLICATION TOPICALLY TWO TIMES DAILY (Patient taking differently: Apply 1 application  topically 2 (two) times daily.) 30 g 0   No current facility-administered medications for this visit.     Family History    Family History  Problem Relation Age of Onset   Stroke Mother    Hypertension Mother    Coronary artery disease Father    Stroke Brother    Heart disease Brother    Other Brother        H1N1 VIRUS   Healthy Sister    Healthy Sister    She indicated that her mother is deceased. She indicated that her father is deceased. She indicated that two of her five sisters are alive. She indicated that only one of her six brothers is alive. She indicated that her maternal grandmother is deceased. She indicated that her maternal grandfather is deceased. She indicated that her paternal grandmother is deceased. She indicated that her paternal grandfather is deceased. She indicated that her son is deceased. She indicated that all of her four children are alive.  Social History    Social History   Socioeconomic History   Marital status: Widowed    Spouse name: Not on file   Number of children: Not on file   Years of  education: Not on file   Highest education level: Not on file  Occupational History   Not on file  Tobacco Use   Smoking status: Every Day    Packs/day: 2.00    Years: 58.00    Total pack years: 116.00    Types: Cigarettes    Start date: 12/26/1952    Last attempt to quit: 12/05/2013    Years since quitting: 8.1   Smokeless tobacco: Never   Tobacco comments:    uses Vape cigarettes  Vaping Use   Vaping Use: Never used  Substance and Sexual Activity   Alcohol use: No    Alcohol/week: 0.0 standard drinks of alcohol   Drug use: No   Sexual activity: Not on file  Other Topics Concern   Not on file  Social History Narrative   Patient lives in [place] with his husband and grandson.   Son completed suicide on September 17th of this year, patient found him.   Social Determinants of Health   Financial Resource Strain: Not on file  Food Insecurity: Not on file  Transportation Needs: Not on file  Physical Activity: Not on file  Stress: Not on file  Social Connections: Not on file  Intimate Partner Violence: Not on file     Review of Systems    General:  No chills, fever, night sweats or weight changes.  Cardiovascular:  No chest pain, dyspnea on exertion, edema, orthopnea, palpitations, paroxysmal nocturnal dyspnea. Dermatological: No rash, lesions/masses Respiratory: No cough, dyspnea Urologic: No hematuria, dysuria Abdominal:   No nausea, vomiting, diarrhea, bright red blood per rectum, melena, or hematemesis Neurologic:  No visual changes, wkns, changes in mental status. All other systems reviewed and are otherwise negative except as noted above.     Physical Exam    VS:  There were no vitals taken for this visit. , BMI There is no height or weight on file to calculate BMI.     GEN: Well nourished, well developed, in no acute distress. HEENT: normal. Neck:  Supple, no JVD, carotid bruits, or masses. Cardiac: RRR, no murmurs, rubs, or gallops. No clubbing,  cyanosis, edema.  Radials/DP/PT 2+ and equal bilaterally.  Respiratory:  Respirations regular and unlabored, clear to auscultation bilaterally. GI: Soft, nontender, nondistended, BS + x 4. MS: no deformity or atrophy. Skin: warm and dry, no rash. Neuro:  Strength and sensation are intact. Psych: Normal affect.  Accessory Clinical Findings    ECG personally reviewed by me today- *** - No acute changes  Lab Results  Component Value Date   WBC 6.6 10/05/2021   HGB 14.2 10/05/2021   HCT 42.0 10/05/2021   MCV 94.0 10/05/2021   PLT 201.0 10/05/2021   Lab Results  Component Value Date   CREATININE 1.24 (H) 10/05/2021   BUN 11 10/05/2021   NA 140 10/05/2021   K 3.5 10/05/2021   CL 100 10/05/2021   CO2 32 10/05/2021   Lab Results  Component Value Date   ALT 11 10/05/2021   AST 15 10/05/2021   ALKPHOS 79 10/05/2021   BILITOT 0.3 10/05/2021   Lab Results  Component Value Date   CHOL 141 10/05/2021   HDL 52.20 10/05/2021   LDLCALC 53 10/05/2021   TRIG 179.0 (H) 10/05/2021   CHOLHDL 3 10/05/2021    Lab Results  Component Value Date   HGBA1C 6.5 10/05/2021    Review of Prior Studies: Echocardiogram 10/21/2025 Left ventricle: Systolic function was normal. The estimated    ejection fraction was in the range of 50% to 55%. Doppler    parameters are consistent with abnormal left ventricular    relaxation (grade 1 diastolic dysfunction).  - Impressions: AV optimazation with no clear change in mitral    inflow as they all showed abnormal relaxation with prominant    atrial contribution to filling    and no CO calculated at varying AV intervals   Assessment & Plan   1.  ***    Current medicines are reviewed at length with the patient today.  I have spent *** min's  dedicated to the care of this patient on the date of this encounter to include pre-visit review of records, assessment, management and diagnostic testing,with shared decision making. Signed, Phill Myron.  West Pugh, ANP, AACC   02/03/2022 7:53 AM      Office [phone] Fax [phone]  Notice: This dictation was prepared with Dragon dictation along with smaller phrase technology. Any transcriptional errors that result from this process are unintentional and may not be corrected upon review.

## 2022-02-04 ENCOUNTER — Ambulatory Visit: Payer: Medicare HMO | Attending: General Practice | Admitting: Adult Health

## 2022-02-10 ENCOUNTER — Encounter: Payer: Self-pay | Admitting: Adult Health

## 2022-02-16 ENCOUNTER — Ambulatory Visit: Payer: Medicare HMO | Attending: Internal Medicine | Admitting: Internal Medicine

## 2022-02-16 DIAGNOSIS — Z95810 Presence of automatic (implantable) cardiac defibrillator: Secondary | ICD-10-CM

## 2022-02-16 DIAGNOSIS — I951 Orthostatic hypotension: Secondary | ICD-10-CM

## 2022-02-16 DIAGNOSIS — I255 Ischemic cardiomyopathy: Secondary | ICD-10-CM

## 2022-02-16 DIAGNOSIS — I5032 Chronic diastolic (congestive) heart failure: Secondary | ICD-10-CM

## 2022-02-16 NOTE — Progress Notes (Incomplete)
Patient Care Team: Maximiano Coss, NP as PCP - General (Adult Health Nurse Practitioner) Lorretta Harp, MD as PCP - Cardiology (Cardiology) Lorretta Harp, MD as Consulting Physician (Cardiology) Chucky May, MD as Consulting Physician (Psychiatry) Kristeen Miss, MD as Consulting Physician (Neurosurgery)   HPI  [name] is a 81 y.o. female Seen in follow-up for CRT- ICD implanted 4/16 for ischemic cardiomyopathy with prior stenting.  She has a history of congestive heart failure and left bundle branch block. Near normalization of LV function Prior syncope.  The patient denies chest pain***, shortness of breath***, nocturnal dyspnea***, orthopnea*** or peripheral edema***.  There have been no palpitations***, lightheadedness*** or syncope***.  Complains of ***.      DATE TEST EF   4/16 Echo   20-25 %   8/17 Echo   50-55 %         Date Cr K TSH  2/19 1.28 4.6  8.440   6/20 1.19 3.8 1.64  9/22 1.20 3.9 2.74  8/23 1.24 3.5 15.82***   Her son died***  and her husband died in 2022/07/13.***   She is extremely remorseful about the circumstances of his dying, having been convinced to send him to a nursing home for some days.  Thankfully she was able to get him home for the last 2 days before he died.  She was married at the age of 60.     Past Medical History:  Diagnosis Date   Anxiety    Arthritis    Chronic pain    a. Prior h/o chronic pain on methadone.   Chronic systolic CHF (congestive heart failure) (HCC)    a. mixed ischemic/non-ischemic cardiomyopathy. b. s/p CRT-D in 07/13/14.   CKD (chronic kidney disease), stage III (HCC)    Complication of anesthesia    hard time waking her up from general surgery   Coronary artery disease    a. remote RCA stenting in 2008 with non-DES. b. Cath 07/13/2014 following abnormal nuc: Stable, unchanged from prior cath, patent stent and 40% LM.   Diabetes mellitus (HCC)    GERD (gastroesophageal reflux disease)    Headache     Hyperlipidemia    Hypertension    LBBB (left bundle branch block)    Nonischemic cardiomyopathy (HCC)    OSA (obstructive sleep apnea)    AHI-9.77/hr, during REM-50.32/hr   Tobacco abuse     Past Surgical History:  Procedure Laterality Date   BACK SURGERY  2013   BI-VENTRICULAR IMPLANTABLE CARDIOVERTER DEFIBRILLATOR N/A 06/11/2014   STJ CRTD implanted by Dr Caryl Comes   CARDIAC CATHETERIZATION  12/14/2006   RCA stented with a 3.0 [employer] Liberte stent resulting in a reduction of 75% to 0% residual   CHOLECYSTECTOMY     20 years ago   COLONOSCOPY WITH PROPOFOL N/A 12/15/2014   Procedure: COLONOSCOPY WITH PROPOFOL;  Surgeon: Clarene Essex, MD;  Location: [facility];  Service: Endoscopy;  Laterality: N/A;   EYE SURGERY     bilateral cataract surgery    LEFT AND RIGHT HEART CATHETERIZATION WITH CORONARY ANGIOGRAM N/A 05/29/2014   Procedure: LEFT AND RIGHT HEART CATHETERIZATION WITH CORONARY ANGIOGRAM;  Surgeon: Lorretta Harp, MD;  Location: [facility] CATH LAB;  Service: Cardiovascular;  Laterality: N/A;   LEFT HEART CATHETERIZATION WITH CORONARY ANGIOGRAM N/A 12/27/2012   Procedure: LEFT HEART CATHETERIZATION WITH CORONARY ANGIOGRAM;  Surgeon: Lorretta Harp, MD;  Location: [facility] CATH LAB;  Service: Cardiovascular;  Laterality: N/A;  Current Outpatient Medications  Medication Sig Dispense Refill   Accu-Chek Softclix Lancets lancets TEST BLOOD SUGAR 1 TO 4 TIMES DAILY AS DIRECTED. 400 each 11   albuterol (VENTOLIN HFA) 108 (90 Base) MCG/ACT inhaler Inhale 2 puffs into the lungs every 6 (six) hours as needed for wheezing or shortness of breath. 8 g 0   Alcohol Swabs (DROPSAFE ALCOHOL PREP) 70 % PADS USE 1 TO 4 TIMES DAILY AS DIRECTED 400 each 11   aspirin EC 81 MG tablet Take 81 mg by mouth at bedtime.     blood glucose meter kit and supplies KIT Dispense based on patient and insurance preference. Use three times a day as directed. Dx. E11.65, Z79.4 1 each 11   blood glucose meter kit and  supplies Dispense based on patient and insurance preference. Use up to four times daily as directed. (FOR ICD-10 E10.9, E11.9). 1 each 0   Blood Glucose Monitoring Suppl (ACCU-CHEK GUIDE) w/Device KIT USE AS DIRECTED 1 kit 0   Blood Glucose Monitoring Suppl (TRUE METRIX AIR GLUCOSE METER) w/Device KIT USE AS DIRECTED 1 kit 0   carvedilol (COREG) 12.5 MG tablet TAKE 1 AND 1/2 TABLETS TWICE DAILY 270 tablet 0   clopidogrel (PLAVIX) 75 MG tablet TAKE 1 TABLET EVERY DAY 90 tablet 1   doxycycline (VIBRA-TABS) 100 MG tablet Take 1 tablet (100 mg total) by mouth 2 (two) times daily. (Patient not taking: Reported on 01/10/2022) 20 tablet 0   furosemide (LASIX) 40 MG tablet TAKE 2 TABLETS ONE TIME DAILY IN THE MORNING AND TAKE 1 TABLET EVERY EVENING (MUST KEEP APPT FOR FUTURE REFILLS) 284 tablet 2   Garlic 132 MG CAPS Take 500 mg by mouth daily.     glucose blood (ACCU-CHEK GUIDE) test strip TEST BLOOD SUGAR 1 TO 4 TIMES DAILY AS DIRECTED 350 strip 11   insulin detemir (LEVEMIR FLEXTOUCH) 100 UNIT/ML FlexPen Injects 16 units subcutaneously once daily 15 mL 1   Insulin Pen Needle (BD PEN NEEDLE MICRO U/F) 32G X 6 MM MISC USE NEW NEEDLE FOR EACH INJECTION OF INSULIN, FOUR TIMES DAILY 100 each 5   ipratropium (ATROVENT) 0.03 % nasal spray Place 2 sprays into both nostrils 2 (two) times daily. (Patient not taking: Reported on 01/10/2022) 30 mL 3   ketoconazole (NIZORAL) 2 % cream Apply 1 application. topically daily. 60 g 0   levothyroxine (SYNTHROID) 100 MCG tablet Take 1 tablet (100 mcg total) by mouth daily. 90 tablet 0   mupirocin ointment (BACTROBAN) 2 % Apply 1 application topically 3 (three) times daily. (Patient not taking: Reported on 01/10/2022) 22 g 0   nitroGLYCERIN (NITROSTAT) 0.4 MG SL tablet Place 1 tablet (0.4 mg total) under the tongue every 5 (five) minutes as needed for chest pain. 25 tablet 3   NONFORMULARY OR COMPOUNDED ITEM Pain cream : ketamine 5%, baclofen 2%, gabapentin 5%, lidocaine 5%,  menthol 1% Order faxed to [facility] (PERCOCET/ROXICET) 5-325 MG tablet Take 1 tablet by mouth 2 (two) times daily as needed for severe pain.     potassium chloride SA (KLOR-CON M) 20 MEQ tablet TAKE 1 TABLET TWICE DAILY ON MONDAY, WEDNESDAY AND FRIDAY (DAYS YOU TAKE LASIX/FUROSEMIDE) 78 tablet 10   rOPINIRole (REQUIP) 0.5 MG tablet TAKE 1 TABLET AT BEDTIME (Patient not taking: Reported on 01/10/2022) 90 tablet 0   sertraline (ZOLOFT) 25 MG tablet Take 1 tablet (25 mg total) by mouth daily. 90 tablet 1   simvastatin (ZOCOR) 20 MG  tablet TAKE 1 TABLET BY MOUTH ONCE DAILY WITH SUPPER 90 tablet 3   traZODone (DESYREL) 150 MG tablet Take 1 tablet (150 mg total) by mouth at bedtime. 90 tablet 3   triamcinolone cream (KENALOG) 0.1 % APPLY ONE APPLICATION TOPICALLY TWO TIMES DAILY (Patient taking differently: Apply 1 application  topically 2 (two) times daily.) 30 g 0   No current facility-administered medications for this visit.    Allergies  Allergen Reactions   Valium [Diazepam] Swelling    face      Review of Systems negative except from HPI and PMH  Physical Exam There were no vitals taken for this visit. Well developed and well nourished in no acute distress HENT normal Neck supple with JVP-flat Clear Device pocket well healed; without hematoma or erythema.  There is no tethering  Regular rate and rhythm, no *** gallop No ***/*** murmur Abd-soft with active BS No Clubbing cyanosis *** edema Skin-warm and dry A & Oriented  Grossly normal sensory and motor function  ECG ***  Device function is ***normal. ***Programming changes ***  See Paceart for details    Assessment and  Plan Ischemic cardiomyopathy with interval normalization of LV function   Congestive heart failure-chronic-diastolic  Hypertension  Orthostatic hypotension  Diabetes  CRT-D-[employer]   Grief  Device function is normal.  Blood pressure is low, but no  symptoms.  We will continue her on carvedilol 18.75 twice daily given recovered LV function.  Blood pressure is too low to add an ACE or ARB at this time.  No orthostatic symptoms.  Euvolemic.  We will continue Lasix 80 every morning and  40 every afternoon.  Lengthy discussion regarding the loss of her husband and her son the latter by suicide at age of 7

## 2022-02-28 ENCOUNTER — Other Ambulatory Visit: Payer: Self-pay | Admitting: Family Medicine

## 2022-02-28 DIAGNOSIS — E039 Hypothyroidism, unspecified: Secondary | ICD-10-CM

## 2022-03-04 NOTE — Progress Notes (Signed)
No ICM remote transmission received for 03/01/2022 and next ICM transmission scheduled for 03/09/2022.

## 2022-03-09 ENCOUNTER — Ambulatory Visit (INDEPENDENT_AMBULATORY_CARE_PROVIDER_SITE_OTHER): Payer: Medicare HMO

## 2022-03-09 DIAGNOSIS — Z95810 Presence of automatic (implantable) cardiac defibrillator: Secondary | ICD-10-CM

## 2022-03-09 DIAGNOSIS — I5022 Chronic systolic (congestive) heart failure: Secondary | ICD-10-CM

## 2022-03-11 NOTE — Progress Notes (Signed)
EPIC Encounter for ICM Monitoring  Patient Name: [name] Situ is a 82 y.o. female Date: 03/11/2022 Primary Care Physican: Maximiano Coss, NP Primary Cardiologist: Gwenlyn Found Electrophysiologist: Vergie Living Pacing:  >99%           12/22/2021 Weight: 143-144 lbs  03/11/2022 Weight: 143 - 145 lbs          Battery Longevity: 5.9 months                     Spoke with patient and heart failure questions reviewed.  Transmission results reviewed.  Pt asymptomatic for fluid accumulation.  Reports feeling well at this time and voices no complaints.     Corvue thoracic impedance suggesting normal fluid levels.     Prescribed:  Furosemide 40 mg on take 2 tablets (80 mg total) every morning and 1 tablet (40 mg total) every evening.   Potassium 20 mEq On Monday, Wednesday AND Friday TAKE 1 TAB BY MOUTH 2 TIMES A DAY and on the days you take lasix.   Labs: 10/05/2021 Creatinine 1.24, BUN 11, Potassium 3.5, Sodium 140, GFR 41.27 06/01/2021 Creatinine 1.46, BUN 18, Potassium 3.8, Sodium 139, GFR 34.01 A complete set of results can be found in Results Review.   Recommendations:  No changes and encouraged to call if experiencing any fluid symptoms.   Follow-up plan: ICM clinic phone appointment on 04/11/2022.   91 day device clinic remote transmission 03/16/2022.     EP/Cardiology Office Visits:   Advised to call office and reschedule Missed 02/16/2022 appt with Dr Caryl Comes and 02/04/2022 with Jory Sims NP.   Recall 06/18/2022 with Dr Gwenlyn Found.     Copy of ICM check sent to Dr. Caryl Comes.     3 month ICM trend: 03/10/2022.    12-14 Month ICM trend:     [name], RN 03/11/2022 12:43 PM

## 2022-03-16 ENCOUNTER — Ambulatory Visit (INDEPENDENT_AMBULATORY_CARE_PROVIDER_SITE_OTHER): Payer: Medicare HMO

## 2022-03-16 DIAGNOSIS — I428 Other cardiomyopathies: Secondary | ICD-10-CM

## 2022-03-18 LAB — CUP PACEART REMOTE DEVICE CHECK
Battery Remaining Longevity: 6 mo
Battery Remaining Percentage: 6 %
Battery Voltage: 2.63 V
Brady Statistic AP VP Percent: 14 %
Brady Statistic AP VS Percent: 1 %
Brady Statistic AS VP Percent: 86 %
Brady Statistic AS VS Percent: 1 %
Brady Statistic RA Percent Paced: 14 %
Date Time Interrogation Session: 20240111121901
HighPow Impedance: 69 Ohm
HighPow Impedance: 69 Ohm
Implantable Lead Connection Status: 753985
Implantable Lead Connection Status: 753985
Implantable Lead Connection Status: 753985
Implantable Lead Implant Date: 20160406
Implantable Lead Implant Date: 20160406
Implantable Lead Implant Date: 20160406
Implantable Lead Location: 753858
Implantable Lead Location: 753859
Implantable Lead Location: 753860
Implantable Lead Model: 7122
Implantable Pulse Generator Implant Date: 20160406
Lead Channel Impedance Value: 1350 Ohm
Lead Channel Impedance Value: 360 Ohm
Lead Channel Impedance Value: 650 Ohm
Lead Channel Pacing Threshold Amplitude: 0.625 V
Lead Channel Pacing Threshold Amplitude: 0.75 V
Lead Channel Pacing Threshold Amplitude: 0.75 V
Lead Channel Pacing Threshold Pulse Width: 0.5 ms
Lead Channel Pacing Threshold Pulse Width: 0.5 ms
Lead Channel Pacing Threshold Pulse Width: 0.5 ms
Lead Channel Sensing Intrinsic Amplitude: 12 mV
Lead Channel Sensing Intrinsic Amplitude: 5 mV
Lead Channel Setting Pacing Amplitude: 1.75 V
Lead Channel Setting Pacing Amplitude: 2 V
Lead Channel Setting Pacing Amplitude: 2 V
Lead Channel Setting Pacing Pulse Width: 0.5 ms
Lead Channel Setting Pacing Pulse Width: 0.5 ms
Lead Channel Setting Sensing Sensitivity: 0.5 mV
Pulse Gen Serial Number: 7199559

## 2022-03-25 ENCOUNTER — Other Ambulatory Visit: Payer: Self-pay | Admitting: Cardiovascular Disease

## 2022-03-25 DIAGNOSIS — I2510 Atherosclerotic heart disease of native coronary artery without angina pectoris: Secondary | ICD-10-CM

## 2022-03-28 ENCOUNTER — Telehealth: Payer: Self-pay

## 2022-03-28 ENCOUNTER — Ambulatory Visit: Payer: Medicare HMO | Attending: Internal Medicine | Admitting: Internal Medicine

## 2022-03-28 ENCOUNTER — Encounter: Payer: Self-pay | Admitting: Internal Medicine

## 2022-03-28 VITALS — BP 130/64 | HR 72 | Ht 66.0 in

## 2022-03-28 DIAGNOSIS — I5032 Chronic diastolic (congestive) heart failure: Secondary | ICD-10-CM

## 2022-03-28 DIAGNOSIS — I255 Ischemic cardiomyopathy: Secondary | ICD-10-CM

## 2022-03-28 DIAGNOSIS — Z95810 Presence of automatic (implantable) cardiac defibrillator: Secondary | ICD-10-CM | POA: Diagnosis not present

## 2022-03-28 NOTE — Progress Notes (Unsigned)
Electrophysiology TeleHealth Note   Due to national recommendations of social distancing due to COVID 19, an audio/video telehealth visit is felt to be most appropriate for this patient at this time.  See MyChart message from today for the patient's consent to telehealth for [facility].   Date:  03/28/2022   ID:  [name], DOB [birth year], MRN [id number]  Location: patient's home  Provider location: [address], [place]  Evaluation Performed: Follow-up visit  PCP:  Maximiano Coss, NP  Cardiologist:   Logan Bores Electrophysiologist:  SK   Chief Complaint:  icd in sityu  History of Present Illness:    [name] is a 82 y.o. female who presents via audio/video conferencing for a telehealth visit today.  Since last being seen in our clinic for CRT- ICD implanted 4/16 for ischemic cardiomyopathy with prior stenting. She has a history of congestive heart failure and left bundle branch block. Near normalization of LV function Prior syncope, the patient reports no dyspnea,  Some pain near generator site off and on for about 2 months, no redness; chest pain edema; still with orthostatic LH   Cough and fever and wheezing   Best friend died about 3 weeks; husband 2021--son 11/2015  Lost heat but using space heaters   DATE TEST EF    4/16 Echo   20-25 %    8/17 Echo   50-55 %               Date Cr K TSH  2/19 1.28 4.6  8.440   6/20 1.19 3.8 1.64  9/22 1.20 3.9 2.74         Past Medical History:  Diagnosis Date   Anxiety    Arthritis    Chronic pain    a. Prior h/o chronic pain on methadone.   Chronic systolic CHF (congestive heart failure) (HCC)    a. mixed ischemic/non-ischemic cardiomyopathy. b. s/p CRT-D in 06/2014.   CKD (chronic kidney disease), stage III (HCC)    Complication of anesthesia    hard time waking her up from general surgery   Coronary artery disease    a. remote RCA stenting in 2008 with non-DES. b. Cath 06/2014 following abnormal  nuc: Stable, unchanged from prior cath, patent stent and 40% LM.   Diabetes mellitus (HCC)    GERD (gastroesophageal reflux disease)    Headache    Hyperlipidemia    Hypertension    LBBB (left bundle branch block)    Nonischemic cardiomyopathy (HCC)    OSA (obstructive sleep apnea)    AHI-9.77/hr, during REM-50.32/hr   Tobacco abuse     Past Surgical History:  Procedure Laterality Date   BACK SURGERY  2013   BI-VENTRICULAR IMPLANTABLE CARDIOVERTER DEFIBRILLATOR N/A 06/11/2014   STJ CRTD implanted by Dr Caryl Comes   CARDIAC CATHETERIZATION  12/14/2006   RCA stented with a 3.0 [employer] Liberte stent resulting in a reduction of 75% to 0% residual   CHOLECYSTECTOMY     20 years ago   COLONOSCOPY WITH PROPOFOL N/A 12/15/2014   Procedure: COLONOSCOPY WITH PROPOFOL;  Surgeon: Clarene Essex, MD;  Location: [facility];  Service: Endoscopy;  Laterality: N/A;   EYE SURGERY     bilateral cataract surgery    LEFT AND RIGHT HEART CATHETERIZATION WITH CORONARY ANGIOGRAM N/A 05/29/2014   Procedure: LEFT AND RIGHT HEART CATHETERIZATION WITH CORONARY ANGIOGRAM;  Surgeon: Lorretta Harp, MD;  Location: [facility] CATH LAB;  Service: Cardiovascular;  Laterality:  N/A;   LEFT HEART CATHETERIZATION WITH CORONARY ANGIOGRAM N/A 12/27/2012   Procedure: LEFT HEART CATHETERIZATION WITH CORONARY ANGIOGRAM;  Surgeon: Lorretta Harp, MD;  Location: [facility] CATH LAB;  Service: Cardiovascular;  Laterality: N/A;    Current Outpatient Medications  Medication Sig Dispense Refill   Accu-Chek Softclix Lancets lancets TEST BLOOD SUGAR 1 TO 4 TIMES DAILY AS DIRECTED. 400 each 11   albuterol (VENTOLIN HFA) 108 (90 Base) MCG/ACT inhaler Inhale 2 puffs into the lungs every 6 (six) hours as needed for wheezing or shortness of breath. 8 g 0   Alcohol Swabs (DROPSAFE ALCOHOL PREP) 70 % PADS USE 1 TO 4 TIMES DAILY AS DIRECTED 400 each 11   aspirin EC 81 MG tablet Take 81 mg by mouth at bedtime.     blood glucose meter kit and supplies  KIT Dispense based on patient and insurance preference. Use three times a day as directed. Dx. E11.65, Z79.4 1 each 11   blood glucose meter kit and supplies Dispense based on patient and insurance preference. Use up to four times daily as directed. (FOR ICD-10 E10.9, E11.9). 1 each 0   Blood Glucose Monitoring Suppl (ACCU-CHEK GUIDE) w/Device KIT USE AS DIRECTED 1 kit 0   Blood Glucose Monitoring Suppl (TRUE METRIX AIR GLUCOSE METER) w/Device KIT USE AS DIRECTED 1 kit 0   carvedilol (COREG) 12.5 MG tablet TAKE 1 AND 1/2 TABLETS TWICE DAILY 270 tablet 0   clopidogrel (PLAVIX) 75 MG tablet TAKE 1 TABLET EVERY DAY 90 tablet 3   furosemide (LASIX) 40 MG tablet TAKE 2 TABLETS ONE TIME DAILY IN THE MORNING AND TAKE 1 TABLET EVERY EVENING (MUST KEEP APPT FOR FUTURE REFILLS) 568 tablet 2   Garlic 127 MG CAPS Take 500 mg by mouth daily.     glucose blood (ACCU-CHEK GUIDE) test strip TEST BLOOD SUGAR 1 TO 4 TIMES DAILY AS DIRECTED 350 strip 11   insulin detemir (LEVEMIR FLEXTOUCH) 100 UNIT/ML FlexPen Injects 16 units subcutaneously once daily 15 mL 1   Insulin Pen Needle (BD PEN NEEDLE MICRO U/F) 32G X 6 MM MISC USE NEW NEEDLE FOR EACH INJECTION OF INSULIN, FOUR TIMES DAILY 100 each 5   ipratropium (ATROVENT) 0.03 % nasal spray Place 2 sprays into both nostrils 2 (two) times daily. 30 mL 3   ketoconazole (NIZORAL) 2 % cream Apply 1 application. topically daily. 60 g 0   levothyroxine (SYNTHROID) 100 MCG tablet TAKE 1 TABLET EVERY DAY (NO FURTHER REFILLS, MUST ESTABLISH WTH A NEW PCP) 90 tablet 3   nitroGLYCERIN (NITROSTAT) 0.4 MG SL tablet Place 1 tablet (0.4 mg total) under the tongue every 5 (five) minutes as needed for chest pain. 25 tablet 3   NONFORMULARY OR COMPOUNDED ITEM Pain cream : ketamine 5%, baclofen 2%, gabapentin 5%, lidocaine 5%, menthol 1% Order faxed to [facility] (PERCOCET/ROXICET) 5-325 MG tablet Take 1 tablet by mouth 2 (two) times daily as needed for  severe pain.     potassium chloride SA (KLOR-CON M) 20 MEQ tablet TAKE 1 TABLET TWICE DAILY ON MONDAY, WEDNESDAY AND FRIDAY (DAYS YOU TAKE LASIX/FUROSEMIDE) 78 tablet 10   sertraline (ZOLOFT) 25 MG tablet Take 1 tablet (25 mg total) by mouth daily. 90 tablet 1   simvastatin (ZOCOR) 20 MG tablet TAKE 1 TABLET BY MOUTH ONCE DAILY WITH SUPPER 90 tablet 3   traZODone (DESYREL) 150 MG tablet Take 1 tablet (150 mg total) by mouth at bedtime. 90 tablet 3  doxycycline (VIBRA-TABS) 100 MG tablet Take 1 tablet (100 mg total) by mouth 2 (two) times daily. 20 tablet 0   mupirocin ointment (BACTROBAN) 2 % Apply 1 application topically 3 (three) times daily. (Patient not taking: Reported on 03/28/2022) 22 g 0   rOPINIRole (REQUIP) 0.5 MG tablet TAKE 1 TABLET AT BEDTIME (Patient not taking: Reported on 01/10/2022) 90 tablet 0   triamcinolone cream (KENALOG) 0.1 % APPLY ONE APPLICATION TOPICALLY TWO TIMES DAILY (Patient not taking: Reported on 03/28/2022) 30 g 0   No current facility-administered medications for this visit.    Allergies:   Valium [diazepam]      ROS:  Please see the history of present illness.   All other systems are personally reviewed and negative.    Exam:    Vital Signs:  BP 130/64 Comment: pt provided  Pulse 72 Comment: pt provided  Ht '5\' 6"'$  (1.676 m)   BMI 23.40 kg/m         Labs/Other Tests and Data Reviewed:    Recent Labs: 10/05/2021: ALT 11; BUN 11; Creatinine, Ser 1.24; Hemoglobin 14.2; Platelets 201.0; Potassium 3.5; Sodium 140; TSH 15.82   Wt Readings from Last 3 Encounters:  01/10/22 145 lb (65.8 kg)  12/10/21 144 lb 6 oz (65.5 kg)  10/05/21 145 lb (65.8 kg)     Other studies personally reviewed: Additional studies/ records that were reviewed today include: (As above)   Review of the above records today demonstrates: (As above)     Last device remote is reviewed from [facility] PDF dated 1/24 which reveals normal device function,   arrhythmias - none   Battery  approaching Elective Replacement Indicator   6 mon   ASSESSMENT & PLAN:    Ischemic cardiomyopathy with interval normalization of LV function    Congestive heart failure-chronic-diastolic   Hypertension   Orthostatic hypotension   Diabetes   CRT-D-[employer]    Grief  with recent loss of her best friend   Orthostasis stable. Reviewed importance of attending and trying to avoid BP well controlled   continue carvedilol   Euvolemic  will continue furosemide  I have reached out to Dr Demetrios Isaacs as to whether she needs DAPT  Device Battery approaching Elective Replacement Indicator   and reviewed vibration alert     Follow-up:  4-5 months EP APP Next remote: As Scheduled   Current medicines are reviewed at length with the patient today.   The patient does not have concerns regarding her medicines.  The following changes were made today:  none  Labs/ tests ordered today include:  none No orders of the defined types were placed in this encounter.   Future tests       Today, I have spent 16 minutes with the patient with telehealth technology discussing the above.  Signed, Virl Axe, MD  03/28/2022 1:56 PM     [place] [address] [phone] (office) [phone] (fax)

## 2022-03-28 NOTE — Telephone Encounter (Signed)
  Patient Consent for Virtual Visit        [name] has provided verbal consent on 03/28/2022 for a virtual visit (video or telephone).   CONSENT FOR VIRTUAL VISIT FOR:  [name]  By participating in this virtual visit I agree to the following:  I hereby voluntarily request, consent and authorize [facility] and its employed or contracted physicians, physician assistants, nurse practitioners or other licensed health care professionals (the Practitioner), to provide me with telemedicine health care services (the "Services") as deemed necessary by the treating Practitioner. I acknowledge and consent to receive the Services by the Practitioner via telemedicine. I understand that the telemedicine visit will involve communicating with the Practitioner through live audiovisual communication technology and the disclosure of certain medical information by electronic transmission. I acknowledge that I have been given the opportunity to request an in-person assessment or other available alternative prior to the telemedicine visit and am voluntarily participating in the telemedicine visit.  I understand that I have the right to withhold or withdraw my consent to the use of telemedicine in the course of my care at any time, without affecting my right to future care or treatment, and that the Practitioner or I may terminate the telemedicine visit at any time. I understand that I have the right to inspect all information obtained and/or recorded in the course of the telemedicine visit and may receive copies of available information for a reasonable fee.  I understand that some of the potential risks of receiving the Services via telemedicine include:  Delay or interruption in medical evaluation due to technological equipment failure or disruption; Information transmitted may not be sufficient (e.g. poor resolution of images) to allow for appropriate medical decision making by the Practitioner;  and/or  In rare instances, security protocols could fail, causing a breach of personal health information.  Furthermore, I acknowledge that it is my responsibility to provide information about my medical history, conditions and care that is complete and accurate to the best of my ability. I acknowledge that Practitioner's advice, recommendations, and/or decision may be based on factors not within their control, such as incomplete or inaccurate data provided by me or distortions of diagnostic images or specimens that may result from electronic transmissions. I understand that the practice of medicine is not an exact science and that Practitioner makes no warranties or guarantees regarding treatment outcomes. I acknowledge that a copy of this consent can be made available to me via my patient portal ([facility]), or I can request a printed copy by calling the office of [facility].    I understand that my insurance will be billed for this visit.   I have read or had this consent read to me. I understand the contents of this consent, which adequately explains the benefits and risks of the Services being provided via telemedicine.  I have been provided ample opportunity to ask questions regarding this consent and the Services and have had my questions answered to my satisfaction. I give my informed consent for the services to be provided through the use of telemedicine in my medical care

## 2022-03-28 NOTE — Patient Instructions (Addendum)
Medication Instructions:  Your physician has recommended you make the following change in your medication:   ** Stop Clopidogrel '75mg'$  per Dr Caryl Comes and Dr Kennon Holter recommendation.  Lab Work: None ordered.  If you have labs (blood work) drawn today and your tests are completely normal, you will receive your results only by: [facility] (if you have MyChart) OR A paper copy in the mail If you have any lab test that is abnormal or we need to change your treatment, we will call you to review the results.   Testing/Procedures: None ordered.    Follow-Up: At [facility], you and your health needs are our priority.  As part of our continuing mission to provide you with exceptional heart care, we have created designated Provider Care Teams.  These Care Teams include your primary Cardiologist (physician) and Advanced Practice Providers (APPs -  Physician Assistants and Nurse Practitioners) who all work together to provide you with the care you need, when you need it.  We recommend signing up for the patient portal called "MyChart".  Sign up information is provided on this After Visit Summary.  MyChart is used to connect with patients for Virtual Visits (Telemedicine).  Patients are able to view lab/test results, encounter notes, upcoming appointments, etc.  Non-urgent messages can be sent to your provider as well.   To learn more about what you can do with MyChart, go to [email or web address].    Your next appointment:   08/28/2022 at 1030am with Dr Olin Pia PA, Tommye Standard.

## 2022-03-29 ENCOUNTER — Ambulatory Visit: Payer: Medicare HMO | Admitting: Family

## 2022-03-30 DIAGNOSIS — G894 Chronic pain syndrome: Secondary | ICD-10-CM | POA: Diagnosis not present

## 2022-03-30 DIAGNOSIS — F1190 Opioid use, unspecified, uncomplicated: Secondary | ICD-10-CM | POA: Diagnosis not present

## 2022-03-30 DIAGNOSIS — G2581 Restless legs syndrome: Secondary | ICD-10-CM | POA: Diagnosis not present

## 2022-04-02 DIAGNOSIS — J209 Acute bronchitis, unspecified: Secondary | ICD-10-CM | POA: Diagnosis not present

## 2022-04-02 DIAGNOSIS — J189 Pneumonia, unspecified organism: Secondary | ICD-10-CM | POA: Diagnosis not present

## 2022-04-02 DIAGNOSIS — R051 Acute cough: Secondary | ICD-10-CM | POA: Diagnosis not present

## 2022-04-04 ENCOUNTER — Other Ambulatory Visit: Payer: Self-pay | Admitting: Cardiovascular Disease

## 2022-04-06 NOTE — Progress Notes (Signed)
Remote ICD transmission.   

## 2022-04-11 ENCOUNTER — Ambulatory Visit (INDEPENDENT_AMBULATORY_CARE_PROVIDER_SITE_OTHER): Payer: Medicare HMO

## 2022-04-11 DIAGNOSIS — I5032 Chronic diastolic (congestive) heart failure: Secondary | ICD-10-CM | POA: Diagnosis not present

## 2022-04-11 DIAGNOSIS — Z95810 Presence of automatic (implantable) cardiac defibrillator: Secondary | ICD-10-CM

## 2022-04-12 NOTE — Progress Notes (Unsigned)
EPIC Encounter for ICM Monitoring  Patient Name: [name] is a 82 y.o. female Date: 04/12/2022 Primary Care Physican: Maximiano Coss, NP Primary Cardiologist: Gwenlyn Found Electrophysiologist: Vergie Living Pacing:  >99%           12/22/2021 Weight: 143-144 lbs  03/11/2022 Weight: 143 - 145 lbs          Battery Longevity: 6 months                     Attempted call to patient and unable to reach.   Transmission reviewed.    Corvue thoracic impedance suggesting possible dryness starting 1/29 and trending back to baseline.     Prescribed:  Furosemide 40 mg on take 2 tablets (80 mg total) every morning and 1 tablet (40 mg total) every evening.   Potassium 20 mEq On Monday, Wednesday AND Friday TAKE 1 TAB BY MOUTH 2 TIMES A DAY and on the days you take lasix.   Labs: 10/05/2021 Creatinine 1.24, BUN 11, Potassium 3.5, Sodium 140, GFR 41.27 06/01/2021 Creatinine 1.46, BUN 18, Potassium 3.8, Sodium 139, GFR 34.01 A complete set of results can be found in Results Review.   Recommendations:  Unable to reach.     Follow-up plan: ICM clinic phone appointment on 05/16/2022.   91 day device clinic remote transmission 06/20/2022.     EP/Cardiology Office Visits:   08/08/2022 with Tommye Standard, PA.   Recall 06/18/2022 with Dr Gwenlyn Found.     Copy of ICM check sent to Dr. Caryl Comes.     3 month ICM trend: 04/11/2022.    12-14 Month ICM trend:     [name], RN 04/12/2022 8:19 AM

## 2022-04-13 ENCOUNTER — Telehealth: Payer: Self-pay

## 2022-04-13 ENCOUNTER — Other Ambulatory Visit (HOSPITAL_COMMUNITY): Payer: Self-pay | Admitting: Cardiovascular Disease

## 2022-04-13 DIAGNOSIS — I6523 Occlusion and stenosis of bilateral carotid arteries: Secondary | ICD-10-CM

## 2022-04-13 NOTE — Telephone Encounter (Signed)
Remote ICM transmission received.  Attempted call to patient regarding ICM remote transmission and no answer or voice mail option.  

## 2022-04-14 ENCOUNTER — Encounter (HOSPITAL_COMMUNITY): Payer: Medicare HMO

## 2022-04-21 ENCOUNTER — Ambulatory Visit (HOSPITAL_COMMUNITY): Payer: Medicare HMO

## 2022-04-25 ENCOUNTER — Ambulatory Visit (HOSPITAL_COMMUNITY)
Admission: RE | Admit: 2022-04-25 | Discharge: 2022-04-25 | Disposition: A | Discharge status: Home / Self Care | Payer: Medicare HMO | Source: Ambulatory Visit | Attending: Cardiovascular Disease | Admitting: Cardiovascular Disease

## 2022-04-25 DIAGNOSIS — K5909 Other constipation: Secondary | ICD-10-CM | POA: Diagnosis not present

## 2022-04-25 DIAGNOSIS — I6523 Occlusion and stenosis of bilateral carotid arteries: Secondary | ICD-10-CM | POA: Diagnosis not present

## 2022-04-25 DIAGNOSIS — Z86010 Personal history of colonic polyps: Secondary | ICD-10-CM | POA: Diagnosis not present

## 2022-04-25 DIAGNOSIS — K921 Melena: Secondary | ICD-10-CM | POA: Diagnosis not present

## 2022-05-12 ENCOUNTER — Ambulatory Visit (INDEPENDENT_AMBULATORY_CARE_PROVIDER_SITE_OTHER): Payer: Medicare HMO | Admitting: Internal Medicine

## 2022-05-12 ENCOUNTER — Encounter: Payer: Self-pay | Admitting: Internal Medicine

## 2022-05-12 ENCOUNTER — Ambulatory Visit (INDEPENDENT_AMBULATORY_CARE_PROVIDER_SITE_OTHER): Payer: Medicare HMO

## 2022-05-12 VITALS — BP 100/60 | HR 67 | Temp 98.1°F | Ht 66.0 in | Wt 146.0 lb

## 2022-05-12 DIAGNOSIS — Z720 Tobacco use: Secondary | ICD-10-CM

## 2022-05-12 DIAGNOSIS — G8929 Other chronic pain: Secondary | ICD-10-CM

## 2022-05-12 DIAGNOSIS — J479 Bronchiectasis, uncomplicated: Secondary | ICD-10-CM | POA: Diagnosis not present

## 2022-05-12 DIAGNOSIS — M546 Pain in thoracic spine: Secondary | ICD-10-CM | POA: Diagnosis not present

## 2022-05-12 DIAGNOSIS — I509 Heart failure, unspecified: Secondary | ICD-10-CM

## 2022-05-12 DIAGNOSIS — E1149 Type 2 diabetes mellitus with other diabetic neurological complication: Secondary | ICD-10-CM

## 2022-05-12 DIAGNOSIS — J42 Unspecified chronic bronchitis: Secondary | ICD-10-CM

## 2022-05-12 DIAGNOSIS — J189 Pneumonia, unspecified organism: Secondary | ICD-10-CM

## 2022-05-12 DIAGNOSIS — E039 Hypothyroidism, unspecified: Secondary | ICD-10-CM

## 2022-05-12 DIAGNOSIS — N183 Chronic kidney disease, stage 3 unspecified: Secondary | ICD-10-CM

## 2022-05-12 DIAGNOSIS — F418 Other specified anxiety disorders: Secondary | ICD-10-CM | POA: Diagnosis not present

## 2022-05-12 LAB — COMPREHENSIVE METABOLIC PANEL
ALT: 11 U/L (ref 0–35)
AST: 17 U/L (ref 0–37)
Albumin: 3.7 g/dL (ref 3.5–5.2)
Alkaline Phosphatase: 67 U/L (ref 39–117)
BUN: 15 mg/dL (ref 6–23)
CO2: 31 mEq/L (ref 19–32)
Calcium: 9.9 mg/dL (ref 8.4–10.5)
Chloride: 98 mEq/L (ref 96–112)
Creatinine, Ser: 1.3 mg/dL — ABNORMAL HIGH (ref 0.40–1.20)
GFR: 38.83 mL/min — ABNORMAL LOW (ref >60.00)
Glucose, Bld: 112 mg/dL — ABNORMAL HIGH (ref 70–99)
Potassium: 4.2 mEq/L (ref 3.5–5.1)
Sodium: 138 mEq/L (ref 135–145)
Total Bilirubin: 0.5 mg/dL (ref 0.2–1.2)
Total Protein: 7 g/dL (ref 6.0–8.3)

## 2022-05-12 LAB — TSH: TSH: 17.64 u[IU]/mL — ABNORMAL HIGH (ref 0.35–5.50)

## 2022-05-12 LAB — CBC
HCT: 40.5 % (ref 36.0–46.0)
Hemoglobin: 13.8 g/dL (ref 12.0–15.0)
MCHC: 34 g/dL (ref 30.0–36.0)
MCV: 92.5 fl (ref 78.0–100.0)
Platelets: 222 10*3/uL (ref 150.0–400.0)
RBC: 4.38 Mil/uL (ref 3.87–5.11)
RDW: 14.1 % (ref 11.5–15.5)
WBC: 7.4 10*3/uL (ref 4.0–10.5)

## 2022-05-12 LAB — HEMOGLOBIN A1C: Hgb A1c MFr Bld: 7.1 % — ABNORMAL HIGH (ref 4.6–6.5)

## 2022-05-12 MED ORDER — SERTRALINE HCL 25 MG PO TABS: 25.0000 mg | ORAL_TABLET | Freq: Every day | ORAL | 1 refills | Status: DC

## 2022-05-12 MED ORDER — IPRATROPIUM BROMIDE 0.03 % NA SOLN: 2.0000 | Freq: Two times a day (BID) | NASAL | 3 refills | Status: DC

## 2022-05-12 NOTE — Progress Notes (Signed)
   Subjective:   Patient ID: [name], female    DOB: 1940/06/20, 82 y.o.   MRN: [id number]  URI  Associated symptoms include coughing. Pertinent negatives include no abdominal pain, chest pain, diarrhea, nausea or vomiting.   The patient is an 82 YO female with complicated medical history coming in for TOC.   Review of Systems  Constitutional:  Positive for activity change.  HENT:  Positive for voice change.   Eyes: Negative.   Respiratory:  Positive for cough and shortness of breath. Negative for chest tightness.   Cardiovascular:  Negative for chest pain, palpitations and leg swelling.  Gastrointestinal:  Positive for constipation. Negative for abdominal distention, abdominal pain, diarrhea, nausea and vomiting.  Musculoskeletal: Negative.   Skin: Negative.   Neurological: Negative.   Psychiatric/Behavioral:  Positive for decreased concentration and dysphoric mood.     Objective:  Physical Exam Constitutional:      Appearance: She is well-developed.  HENT:     Head: Normocephalic and atraumatic.  Cardiovascular:     Rate and Rhythm: Normal rate and regular rhythm.  Pulmonary:     Effort: Pulmonary effort is normal. No respiratory distress.     Breath sounds: Wheezing and rhonchi present. No rales.  Abdominal:     General: Bowel sounds are normal. There is no distension.     Palpations: Abdomen is soft.     Tenderness: There is no abdominal tenderness. There is no rebound.  Musculoskeletal:     Cervical back: Normal range of motion.  Skin:    General: Skin is warm and dry.  Neurological:     Mental Status: She is alert and oriented to person, place, and time.     Coordination: Coordination normal.     Vitals:   05/12/22 0812  BP: 100/60  Pulse: 67  Temp: 98.1 F (36.7 C)  TempSrc: Oral  SpO2: 99%  Weight: 146 lb (66.2 kg)  Height: 5\' 6"  (1.676 m)    Assessment & Plan:  Visit time 35 minutes in face to face communication with patient and coordination of  care, additional 15 minutes spent in record review, coordination or care, ordering tests, communicating/referring to other healthcare professionals, documenting in medical records all on the same day of the visit for total time 50 minutes spent on the visit.

## 2022-05-12 NOTE — Patient Instructions (Addendum)
We will check the labs today. We will also check the x-ray of the chest and the back.

## 2022-05-13 ENCOUNTER — Other Ambulatory Visit: Payer: Self-pay | Admitting: Internal Medicine

## 2022-05-13 ENCOUNTER — Encounter: Payer: Self-pay | Admitting: Internal Medicine

## 2022-05-13 ENCOUNTER — Other Ambulatory Visit (INDEPENDENT_AMBULATORY_CARE_PROVIDER_SITE_OTHER): Payer: Medicare HMO

## 2022-05-13 DIAGNOSIS — E039 Hypothyroidism, unspecified: Secondary | ICD-10-CM | POA: Diagnosis not present

## 2022-05-13 DIAGNOSIS — J189 Pneumonia, unspecified organism: Secondary | ICD-10-CM | POA: Insufficient documentation

## 2022-05-13 DIAGNOSIS — G8929 Other chronic pain: Secondary | ICD-10-CM | POA: Insufficient documentation

## 2022-05-13 DIAGNOSIS — R918 Other nonspecific abnormal finding of lung field: Secondary | ICD-10-CM

## 2022-05-13 LAB — T4, FREE: Free T4: 1.05 ng/dL (ref 0.60–1.60)

## 2022-05-13 NOTE — Assessment & Plan Note (Signed)
No flare today and is taking coreg 12.5 mg BID and lasix 80 mg qam and 40 mg qpm. Weight stable. Continue current dosing.

## 2022-05-13 NOTE — Assessment & Plan Note (Signed)
Checking CXR as patient has completed antibiotics and steroids and is not feeling greatly improved. She has hx bronchiectasis and is currently smoking and unable to quit.

## 2022-05-13 NOTE — Assessment & Plan Note (Signed)
Checking HgA1c and is insulin dependent with complications. Stable neuropathy. Is using levemir 16 units daily. Is on statin.

## 2022-05-13 NOTE — Assessment & Plan Note (Signed)
She is not taking zoloft due to running out of refills and is more depressed. Rx zoloft 25 mg daily. Return in 1-2 months and we can titrate dosing if needed.

## 2022-05-13 NOTE — Assessment & Plan Note (Signed)
With flare and more cough than normal. Had CXR at outside facility and told she had pneumonia given antibiotics and steroids and not feeling much improved. Checking CXR today. Depending on results may need further treatment. Advised to quit smoking and unable to make attempt today.

## 2022-05-13 NOTE — Assessment & Plan Note (Signed)
Counseled to quit and she is unable to make attempt at this time. Reminded about risk and harm from smoking.

## 2022-05-13 NOTE — Assessment & Plan Note (Signed)
Checking TSH and adjust synthroid 100 mcg daily as needed.  

## 2022-05-13 NOTE — Assessment & Plan Note (Signed)
Present for >1 year. Ordered x-ray thoracic spine to assess. She is tender left of midline and midline. No prior rash to suggest shingles.

## 2022-05-13 NOTE — Assessment & Plan Note (Signed)
Checking CMP and adjust as needed.  

## 2022-05-16 ENCOUNTER — Telehealth: Payer: Self-pay

## 2022-05-16 ENCOUNTER — Ambulatory Visit: Payer: Medicare HMO | Attending: Internal Medicine

## 2022-05-16 DIAGNOSIS — I5032 Chronic diastolic (congestive) heart failure: Secondary | ICD-10-CM | POA: Diagnosis not present

## 2022-05-16 DIAGNOSIS — Z95810 Presence of automatic (implantable) cardiac defibrillator: Secondary | ICD-10-CM

## 2022-05-16 NOTE — Progress Notes (Signed)
EPIC Encounter for ICM Monitoring  Patient Name: [name] is a 82 y.o. female Date: 05/16/2022 Primary Care Physican: Hoyt Koch, MD Primary Cardiologist: Gwenlyn Found Electrophysiologist: Vergie Living Pacing:  >99%           12/22/2021 Weight: 143-144 lbs  03/11/2022 Weight: 143 - 145 lbs     05/16/2022 Weight: 143-146 lbs      Battery Longevity: 4.6 months                     Spoke with patient and heart failure questions reviewed.  Transmission results reviewed.  Pt asymptomatic for fluid accumulation.  Reports feeling well at this time and voices no complaints.  She has been eating snack foods high in salt which may contribute to decreased impedance.    Corvue thoracic impedance suggesting possible fluid accumulation starting 3/7.     Prescribed:  Furosemide 40 mg on take 2 tablets (80 mg total) every morning and 1 tablet (40 mg total) every evening.   Potassium 20 mEq On Monday, Wednesday AND Friday TAKE 1 TAB BY MOUTH 2 TIMES A DAY and on the days you take lasix.   Labs: 10/05/2021 Creatinine 1.24, BUN 11, Potassium 3.5, Sodium 140, GFR 41.27 06/01/2021 Creatinine 1.46, BUN 18, Potassium 3.8, Sodium 139, GFR 34.01 A complete set of results can be found in Results Review.   Recommendations:  Advised to avoid snacks and other foods high in salt.  Encouraged to call if experiencing any fluid symptoms.   Follow-up plan: ICM clinic phone appointment on 05/23/2022 to recheck fluid levels.   91 day device clinic remote transmission 06/20/2022.     EP/Cardiology Office Visits:   08/08/2022 with Tommye Standard, PA.   Recall 06/18/2022 with Dr Gwenlyn Found.     Copy of ICM check sent to Dr. Caryl Comes.     3 month ICM trend: 05/16/2022.    12-14 Month ICM trend:     [name], RN 05/16/2022 12:31 PM

## 2022-05-16 NOTE — Telephone Encounter (Signed)
ICM call to patient.  Requested to send manual remote transmission to check monthly fluid levels and she agreed to send report.

## 2022-05-17 ENCOUNTER — Telehealth: Payer: Self-pay | Admitting: Internal Medicine

## 2022-05-17 DIAGNOSIS — J42 Unspecified chronic bronchitis: Secondary | ICD-10-CM

## 2022-05-17 MED ORDER — IPRATROPIUM BROMIDE 0.03 % NA SOLN: 2.0000 | Freq: Two times a day (BID) | NASAL | 3 refills | Status: DC

## 2022-05-17 NOTE — Telephone Encounter (Signed)
Called pt she states she was told she had pneumonia by MD, but she did not prescribe anything. Requesting antibiotic to be sent to [facility]..MD is not in the office pls advise /lmb

## 2022-05-17 NOTE — Telephone Encounter (Signed)
Pt had an RX for ipratropium (ATROVENT) 0.03 % nasal spray sent to [facility] on 3.7.24, pt still does not have medication and is requesting we send it to:   [facility]  Phone: [phone]  Fax: [phone]   Please re-send RX ASAP

## 2022-05-17 NOTE — Telephone Encounter (Signed)
Called pt no answer couldn't leave msg due to no vm.Marland KitchenJohny Bradley

## 2022-05-17 NOTE — Telephone Encounter (Signed)
No pneumonia was seen on xray mar 7, and this is what dr C said about this:  With flare and more cough than normal. Had CXR at outside facility and told she had pneumonia given antibiotics and steroids and not feeling much improved. Checking CXR today. Depending on results may need further treatment.   Based on this, I dont see the indication that antibx are needed, sorry

## 2022-05-18 NOTE — Telephone Encounter (Signed)
Pt return call gave MD response../lmb 

## 2022-05-18 NOTE — Telephone Encounter (Signed)
Called pt again still no answer x;s 10 rings. Will retry later../l;mb

## 2022-05-19 ENCOUNTER — Ambulatory Visit (INDEPENDENT_AMBULATORY_CARE_PROVIDER_SITE_OTHER): Payer: Medicare HMO

## 2022-05-19 DIAGNOSIS — I255 Ischemic cardiomyopathy: Secondary | ICD-10-CM

## 2022-05-20 ENCOUNTER — Telehealth: Payer: Self-pay | Admitting: *Deleted

## 2022-05-20 NOTE — Telephone Encounter (Signed)
Notified pt w/ # to call to get set-up.Marland KitchenJohny Bradley

## 2022-05-20 NOTE — Telephone Encounter (Signed)
-----   Message from Carlynn Purl sent at 05/20/2022 10:03 AM EDT ----- Regarding: RE: CT [id number] ----- Message ----- From: Earnstine Regal, RMA Sent: 05/20/2022   9:08 AM EDT To: Carlynn Purl Subject: RE: CT                                         What is the # for her to call ----- Message ----- From: Carlynn Purl Sent: 05/19/2022   4:07 PM EDT To: Earnstine Regal, RMA Subject: RE: CT                                         [facility] has tried calling pt 05/18/2022 2nd attempt/ number not accepting calls at this time/ epic order/ miriam 05/16/2022-1st attempt phone is not taking calls at this time/LM  ----- Message ----- From: Earnstine Regal, RMA Sent: 05/17/2022   1:45 PM EDT To: Carlynn Purl Subject: CT                                             Pt is calling requesting status on her CT Chest

## 2022-05-26 LAB — CUP PACEART REMOTE DEVICE CHECK
Battery Remaining Longevity: 5 mo
Battery Remaining Percentage: 4 %
Battery Voltage: 2.62 V
Brady Statistic AP VP Percent: 13 %
Brady Statistic AP VS Percent: 1 %
Brady Statistic AS VP Percent: 87 %
Brady Statistic AS VS Percent: 1 %
Brady Statistic RA Percent Paced: 13 %
Date Time Interrogation Session: 20240318043729
HighPow Impedance: 70 Ohm
HighPow Impedance: 70 Ohm
Implantable Lead Connection Status: 753985
Implantable Lead Connection Status: 753985
Implantable Lead Connection Status: 753985
Implantable Lead Implant Date: 20160406
Implantable Lead Implant Date: 20160406
Implantable Lead Implant Date: 20160406
Implantable Lead Location: 753858
Implantable Lead Location: 753859
Implantable Lead Location: 753860
Implantable Lead Model: 7122
Implantable Pulse Generator Implant Date: 20160406
Lead Channel Impedance Value: 1450 Ohm
Lead Channel Impedance Value: 380 Ohm
Lead Channel Impedance Value: 640 Ohm
Lead Channel Pacing Threshold Amplitude: 0.75 V
Lead Channel Pacing Threshold Amplitude: 0.875 V
Lead Channel Pacing Threshold Amplitude: 0.875 V
Lead Channel Pacing Threshold Pulse Width: 0.5 ms
Lead Channel Pacing Threshold Pulse Width: 0.5 ms
Lead Channel Pacing Threshold Pulse Width: 0.5 ms
Lead Channel Sensing Intrinsic Amplitude: 12 mV
Lead Channel Sensing Intrinsic Amplitude: 5 mV
Lead Channel Setting Pacing Amplitude: 1.875
Lead Channel Setting Pacing Amplitude: 2 V
Lead Channel Setting Pacing Amplitude: 2 V
Lead Channel Setting Pacing Pulse Width: 0.5 ms
Lead Channel Setting Pacing Pulse Width: 0.5 ms
Lead Channel Setting Sensing Sensitivity: 0.5 mV
Pulse Gen Serial Number: 7199559

## 2022-05-27 ENCOUNTER — Telehealth: Payer: Self-pay | Admitting: Internal Medicine

## 2022-05-27 ENCOUNTER — Other Ambulatory Visit: Payer: Self-pay | Admitting: Family Medicine

## 2022-05-27 DIAGNOSIS — E119 Type 2 diabetes mellitus without complications: Secondary | ICD-10-CM

## 2022-05-27 MED ORDER — LEVEMIR FLEXTOUCH 100 UNIT/ML ~~LOC~~ SOPN: PEN_INJECTOR | SUBCUTANEOUS | 1 refills | Status: DC

## 2022-05-27 NOTE — Telephone Encounter (Signed)
Sent in

## 2022-05-27 NOTE — Telephone Encounter (Signed)
Prescription Request  05/27/2022  LOV: 05/12/2022  What is the name of the medication or equipment?  insulin detemir (LEVEMIR FLEXTOUCH) 100 UNIT/ML FlexPen  Have you contacted your pharmacy to request a refill? Yes   Which pharmacy would you like this sent to?  [place], [place] [address] Phone: [phone] Fax: [phone]    Patient notified that their request is being sent to the clinical staff for review and that they should receive a response within 2 business days.   Please advise at Mobile [phone] (mobile)

## 2022-06-01 NOTE — Telephone Encounter (Signed)
Patient called and said Levemir is no longer covered by her insurance. She would like to know if there is an alternative medication that can be prescribed. Best call back is [phone].

## 2022-06-02 NOTE — Telephone Encounter (Signed)
Patient to check with insurance on basaglar, lantus, tresiba for coverage

## 2022-06-02 NOTE — Telephone Encounter (Signed)
Called patient to informed her of provider recommendation. Patient will call her insurance company to get which alternatives is covered

## 2022-06-02 NOTE — Telephone Encounter (Signed)
Pt satted that she is okay with either Lantus or [place]. Pt also stated she need the tips order for the end of her pen.

## 2022-06-09 ENCOUNTER — Ambulatory Visit
Admission: RE | Admit: 2022-06-09 | Discharge: 2022-06-09 | Disposition: A | Discharge status: Home / Self Care | Payer: Medicare HMO | Source: Ambulatory Visit | Attending: Internal Medicine | Admitting: Internal Medicine

## 2022-06-09 DIAGNOSIS — J189 Pneumonia, unspecified organism: Secondary | ICD-10-CM | POA: Diagnosis not present

## 2022-06-09 DIAGNOSIS — R911 Solitary pulmonary nodule: Secondary | ICD-10-CM | POA: Diagnosis not present

## 2022-06-09 DIAGNOSIS — I700 Atherosclerosis of aorta: Secondary | ICD-10-CM | POA: Diagnosis not present

## 2022-06-09 DIAGNOSIS — R918 Other nonspecific abnormal finding of lung field: Secondary | ICD-10-CM

## 2022-06-09 DIAGNOSIS — R053 Chronic cough: Secondary | ICD-10-CM | POA: Diagnosis not present

## 2022-06-09 DIAGNOSIS — J479 Bronchiectasis, uncomplicated: Secondary | ICD-10-CM | POA: Diagnosis not present

## 2022-06-10 ENCOUNTER — Ambulatory Visit (INDEPENDENT_AMBULATORY_CARE_PROVIDER_SITE_OTHER): Payer: Medicare HMO | Admitting: Internal Medicine

## 2022-06-10 ENCOUNTER — Encounter: Payer: Self-pay | Admitting: Internal Medicine

## 2022-06-10 VITALS — BP 112/60 | HR 76 | Temp 98.3°F | Ht 66.0 in | Wt 148.2 lb

## 2022-06-10 DIAGNOSIS — Z0001 Encounter for general adult medical examination with abnormal findings: Secondary | ICD-10-CM

## 2022-06-10 DIAGNOSIS — Z794 Long term (current) use of insulin: Secondary | ICD-10-CM | POA: Diagnosis not present

## 2022-06-10 DIAGNOSIS — E039 Hypothyroidism, unspecified: Secondary | ICD-10-CM

## 2022-06-10 DIAGNOSIS — F418 Other specified anxiety disorders: Secondary | ICD-10-CM

## 2022-06-10 DIAGNOSIS — Z720 Tobacco use: Secondary | ICD-10-CM | POA: Diagnosis not present

## 2022-06-10 DIAGNOSIS — E785 Hyperlipidemia, unspecified: Secondary | ICD-10-CM

## 2022-06-10 DIAGNOSIS — J189 Pneumonia, unspecified organism: Secondary | ICD-10-CM

## 2022-06-10 DIAGNOSIS — R202 Paresthesia of skin: Secondary | ICD-10-CM

## 2022-06-10 DIAGNOSIS — J42 Unspecified chronic bronchitis: Secondary | ICD-10-CM

## 2022-06-10 DIAGNOSIS — G479 Sleep disorder, unspecified: Secondary | ICD-10-CM

## 2022-06-10 DIAGNOSIS — E119 Type 2 diabetes mellitus without complications: Secondary | ICD-10-CM

## 2022-06-10 DIAGNOSIS — E1149 Type 2 diabetes mellitus with other diabetic neurological complication: Secondary | ICD-10-CM | POA: Diagnosis not present

## 2022-06-10 DIAGNOSIS — I739 Peripheral vascular disease, unspecified: Secondary | ICD-10-CM | POA: Diagnosis not present

## 2022-06-10 MED ORDER — SERTRALINE HCL 25 MG PO TABS: 25.0000 mg | ORAL_TABLET | Freq: Every day | ORAL | 3 refills | Status: DC

## 2022-06-10 MED ORDER — TRESIBA FLEXTOUCH 100 UNIT/ML ~~LOC~~ SOPN: 16.0000 [IU] | PEN_INJECTOR | Freq: Every day | SUBCUTANEOUS | 3 refills | Status: DC

## 2022-06-10 MED ORDER — LEVOTHYROXINE SODIUM 100 MCG PO TABS: 100.0000 ug | ORAL_TABLET | Freq: Every day | ORAL | 3 refills | Status: DC

## 2022-06-10 MED ORDER — SIMVASTATIN 20 MG PO TABS: 20.0000 mg | ORAL_TABLET | Freq: Every day | ORAL | 3 refills | Status: DC

## 2022-06-10 MED ORDER — IPRATROPIUM BROMIDE 0.03 % NA SOLN: 2.0000 | Freq: Two times a day (BID) | NASAL | 3 refills | Status: DC

## 2022-06-10 MED ORDER — TRAZODONE HCL 150 MG PO TABS: 150.0000 mg | ORAL_TABLET | Freq: Every day | ORAL | 3 refills | Status: DC

## 2022-06-10 MED ORDER — BD PEN NEEDLE MICRO U/F 32G X 6 MM MISC: 5 refills | Status: DC

## 2022-06-10 NOTE — Assessment & Plan Note (Signed)
Flu shot yearly. Covid-19 counseled. Pneumonia complete. Shingrix complete. Tetanus due at pharmacy. Colonoscopy aged out. Mammogram aged out, pap smear aged out and dexa complete. Counseled about sun safety and mole surveillance. Counseled about the dangers of distracted driving. Given 10 year screening recommendations.

## 2022-06-10 NOTE — Assessment & Plan Note (Signed)
Awaiting CT scan results reviewed imaging and area on CXR is present on CT scan. Did discuss with her there are several likely scenarios including sampling, repeat scan in time interval, repeat antibiotics with repeat imaging depending on interpretation. We will proceed as recommended based on appearance.

## 2022-06-10 NOTE — Progress Notes (Signed)
Subjective:   Patient ID: [name], female    DOB: Jun 04, 1940, 82 y.o.   MRN: [id number]  HPI Here for medicare wellness and physical, no new complaints. Please see A/P for status and treatment of chronic medical problems.   Diet: heart healthy Physical activity: sedentary, doing housework daily Depression/mood screen: negative Hearing: intact to whispered voice, bilateral aids Visual acuity: grossly normal, performs annual eye exam  ADLs: capable Fall risk: none Home safety: good Cognitive evaluation: intact to orientation, naming, recall and repetition EOL planning: adv directives discussed  Flowsheet Row Office Visit from 06/10/2022 in [facility] [place]  PHQ-2 Total Score 6       Flowsheet Row Office Visit from 06/10/2022 in [facility] [place]  PHQ-9 Total Score 12         12/10/2021   10:11 AM 01/10/2022    2:04 PM 05/12/2022    8:10 AM 05/12/2022    9:12 AM 06/10/2022    8:09 AM  Fall Risk  Falls in the past year? 0 0 0 1 0  Was there an injury with Fall?   0 0 0  Fall Risk Category Calculator   0 2 0  (RETIRED) Patient Fall Risk Level Low fall risk Low fall risk     Patient at Risk for Falls Due to No Fall Risks No Fall Risks     Fall risk Follow up Falls evaluation completed Falls evaluation completed   Falls evaluation completed    I have personally reviewed and have noted 1. The patient's medical and social history - reviewed today no changes 2. Their use of alcohol, tobacco or illicit drugs 3. Their current medications and supplements 4. The patient's functional ability including ADL's, fall risks, home safety risks and hearing or visual impairment. 5. Diet and physical activities 6. Evidence for depression or mood disorders 7. Care team reviewed and updated 8.  The patient is on an opioid pain medication and this was reviewed with patient and non-opioid pain medication options were reviewed and offered  to patient, their pain treatment plan and severity was discussed with them. We have considered referrals as appropriate for patient. Opioid risk factors were also considered and reviewed.   Patient Care Team: Myrlene Broker, MD as PCP - General (Internal Medicine) Runell Gess, MD as PCP - Cardiology (Cardiology) Runell Gess, MD as Consulting Physician (Cardiology) Milagros Evener, MD as Consulting Physician (Psychiatry) Barnett Abu, MD as Consulting Physician (Neurosurgery) Past Medical History:  Diagnosis Date   Anxiety    Arthritis    Chronic pain    a. Prior h/o chronic pain on methadone.   Chronic systolic CHF (congestive heart failure)    a. mixed ischemic/non-ischemic cardiomyopathy. b. s/p CRT-D in 06/2014.   CKD (chronic kidney disease), stage III    Complication of anesthesia    hard time waking her up from general surgery   Coronary artery disease    a. remote RCA stenting in 2008 with non-DES. b. Cath 06/2014 following abnormal nuc: Stable, unchanged from prior cath, patent stent and 40% LM.   Diabetes mellitus    GERD (gastroesophageal reflux disease)    Headache    Hyperlipidemia    Hypertension    LBBB (left bundle branch block)    Nonischemic cardiomyopathy    OSA (obstructive sleep apnea)    AHI-9.77/hr, during REM-50.32/hr   Tobacco abuse    Past Surgical History:  Procedure Laterality  Date   BACK SURGERY  2013   BI-VENTRICULAR IMPLANTABLE CARDIOVERTER DEFIBRILLATOR N/A 06/11/2014   STJ CRTD implanted by Dr Graciela Husbands   CARDIAC CATHETERIZATION  12/14/2006   RCA stented with a 3.0 [employer] Liberte stent resulting in a reduction of 75% to 0% residual   CHOLECYSTECTOMY     20 years ago   COLONOSCOPY WITH PROPOFOL N/A 12/15/2014   Procedure: COLONOSCOPY WITH PROPOFOL;  Surgeon: Vida Rigger, MD;  Location: [facility];  Service: Endoscopy;  Laterality: N/A;   EYE SURGERY     bilateral cataract surgery    LEFT AND RIGHT HEART CATHETERIZATION  WITH CORONARY ANGIOGRAM N/A 05/29/2014   Procedure: LEFT AND RIGHT HEART CATHETERIZATION WITH CORONARY ANGIOGRAM;  Surgeon: Runell Gess, MD;  Location: [facility] CATH LAB;  Service: Cardiovascular;  Laterality: N/A;   LEFT HEART CATHETERIZATION WITH CORONARY ANGIOGRAM N/A 12/27/2012   Procedure: LEFT HEART CATHETERIZATION WITH CORONARY ANGIOGRAM;  Surgeon: Runell Gess, MD;  Location: [facility] CATH LAB;  Service: Cardiovascular;  Laterality: N/A;   Family History  Problem Relation Age of Onset   Stroke Mother    Hypertension Mother    Coronary artery disease Father    Stroke Brother    Heart disease Brother    Other Brother        H1N1 VIRUS   Healthy Sister    Healthy Sister    Review of Systems  Constitutional: Negative.   HENT: Negative.    Eyes: Negative.   Respiratory:  Positive for cough. Negative for chest tightness and shortness of breath.   Cardiovascular:  Negative for chest pain, palpitations and leg swelling.  Gastrointestinal:  Positive for constipation. Negative for abdominal distention, abdominal pain, diarrhea, nausea and vomiting.  Musculoskeletal: Negative.   Skin: Negative.   Neurological: Negative.   Psychiatric/Behavioral: Negative.      Objective:  Physical Exam Constitutional:      Appearance: She is well-developed.  HENT:     Head: Normocephalic and atraumatic.  Cardiovascular:     Rate and Rhythm: Normal rate and regular rhythm.  Pulmonary:     Effort: Pulmonary effort is normal. No respiratory distress.     Breath sounds: Normal breath sounds. No wheezing or rales.  Abdominal:     General: Bowel sounds are normal. There is no distension.     Palpations: Abdomen is soft.     Tenderness: There is no abdominal tenderness. There is no rebound.  Musculoskeletal:     Cervical back: Normal range of motion.  Skin:    General: Skin is warm and dry.  Neurological:     Mental Status: She is alert and oriented to person, place, and time.     Coordination:  Coordination normal.     Vitals:   06/10/22 0801  BP: 112/60  Pulse: 76  Temp: 98.3 F (36.8 C)  TempSrc: Oral  SpO2: 98%  Weight: 148 lb 4 oz (67.2 kg)  Height: 5\' 6"  (1.676 m)    Assessment & Plan:

## 2022-06-10 NOTE — Assessment & Plan Note (Signed)
Taking aspirin 81 mg daily and simvastatin 20 mg daily. Continue and no signs of claudication on exam.

## 2022-06-10 NOTE — Assessment & Plan Note (Signed)
Counseled to quit and unable at this time. 

## 2022-06-10 NOTE — Assessment & Plan Note (Signed)
She will need change to tresiba 16 units daily as levemir is no longer covered.

## 2022-06-13 ENCOUNTER — Other Ambulatory Visit: Payer: Self-pay | Admitting: Internal Medicine

## 2022-06-13 ENCOUNTER — Telehealth: Payer: Self-pay

## 2022-06-13 DIAGNOSIS — R918 Other nonspecific abnormal finding of lung field: Secondary | ICD-10-CM

## 2022-06-13 NOTE — Telephone Encounter (Signed)
CRITICAL VALUE STICKER  CRITICAL VALUE: Significant interval progression of left upper lobe pulmonary nodularity compared to prior imaging from May of 2022, see full report in EPIC.   RECEIVER (on-site recipient of call): Dahlia Client  DATE & TIME NOTIFIED: 06/13/22 at 9:16 am  MESSENGER (representative from lab): Tiffany   MD NOTIFIED: Dr. Okey Dupre

## 2022-06-13 NOTE — Telephone Encounter (Signed)
Spoke with patient.

## 2022-06-15 ENCOUNTER — Ambulatory Visit: Payer: Medicare HMO | Admitting: Pulmonary Disease

## 2022-06-15 ENCOUNTER — Encounter: Payer: Self-pay | Admitting: Pulmonary Disease

## 2022-06-15 VITALS — BP 120/60 | HR 94 | Ht 66.0 in | Wt 148.6 lb

## 2022-06-15 DIAGNOSIS — J984 Other disorders of lung: Secondary | ICD-10-CM | POA: Insufficient documentation

## 2022-06-15 DIAGNOSIS — Z720 Tobacco use: Secondary | ICD-10-CM

## 2022-06-15 DIAGNOSIS — F17200 Nicotine dependence, unspecified, uncomplicated: Secondary | ICD-10-CM

## 2022-06-15 NOTE — Progress Notes (Unsigned)
Synopsis: Referred in April 2024 for lung mass by Myrlene Brokerrawford, Elizabeth A, *  Subjective:   PATIENT ID: Allison LeversPatsy M Bradley GENDER: female DOB: [birth year], MRN: [id number]  Chief Complaint  Patient presents with   Consult    Lung nodule.    This is an 82 year old female seen today with past medical history of anxiety, chronic pain, coronary artery disease, diabetes, gastroesophageal reflux, hyperlipidemia, hypertension and longstanding history of tobacco abuse.  Patient was referred today for evaluation after having abnormal CT chest she does have a coronary artery disease and was on Plavix in the past.    ***  Past Medical History:  Diagnosis Date   Anxiety    Arthritis    Chronic pain    a. Prior h/o chronic pain on methadone.   Chronic systolic CHF (congestive heart failure)    a. mixed ischemic/non-ischemic cardiomyopathy. b. s/p CRT-D in 06/2014.   CKD (chronic kidney disease), stage III    Complication of anesthesia    hard time waking her up from general surgery   Coronary artery disease    a. remote RCA stenting in 2008 with non-DES. b. Cath 06/2014 following abnormal nuc: Stable, unchanged from prior cath, patent stent and 40% LM.   Diabetes mellitus    GERD (gastroesophageal reflux disease)    Headache    Hyperlipidemia    Hypertension    LBBB (left bundle branch block)    Nonischemic cardiomyopathy    OSA (obstructive sleep apnea)    AHI-9.77/hr, during REM-50.32/hr   Tobacco abuse      Family History  Problem Relation Age of Onset   Stroke Mother    Hypertension Mother    Coronary artery disease Father    Stroke Brother    Heart disease Brother    Other Brother        H1N1 VIRUS   Healthy Sister    Healthy Sister      Past Surgical History:  Procedure Laterality Date   BACK SURGERY  2013   BI-VENTRICULAR IMPLANTABLE CARDIOVERTER DEFIBRILLATOR N/A 06/11/2014   STJ CRTD implanted by Dr Graciela HusbandsKlein   CARDIAC CATHETERIZATION  12/14/2006   RCA stented with a 3.0  [employer] Liberte stent resulting in a reduction of 75% to 0% residual   CHOLECYSTECTOMY     20 years ago   COLONOSCOPY WITH PROPOFOL N/A 12/15/2014   Procedure: COLONOSCOPY WITH PROPOFOL;  Surgeon: Vida RiggerMarc Magod, MD;  Location: [facility];  Service: Endoscopy;  Laterality: N/A;   EYE SURGERY     bilateral cataract surgery    LEFT AND RIGHT HEART CATHETERIZATION WITH CORONARY ANGIOGRAM N/A 05/29/2014   Procedure: LEFT AND RIGHT HEART CATHETERIZATION WITH CORONARY ANGIOGRAM;  Surgeon: Runell GessJonathan J Berry, MD;  [facility];  Service: Cardiovascular;  Laterality: N/A;   LEFT HEART CATHETERIZATION WITH CORONARY ANGIOGRAM N/A 12/27/2012   Procedure: LEFT HEART CATHETERIZATION WITH CORONARY ANGIOGRAM;  Surgeon: Runell GessJonathan J Berry, MD;  [facility];  Service: Cardiovascular;  Laterality: N/A;    Social History   Socioeconomic History   Marital status: Widowed    Spouse name: Not on file   Number of children: Not on file   Years of education: Not on file   Highest education level: Not on file  Occupational History   Not on file  Tobacco Use   Smoking status: Every Day    Packs/day: 2.00    Years: 58.00    Additional pack years: 0.00    Total pack  years: 116.00    Types: Cigarettes    Start date: 12/26/1952   Smokeless tobacco: Never   Tobacco comments:    Smoke 7 packs of cigarettes a week. 06/15/2022 Tay  Vaping Use   Vaping Use: Never used  Substance and Sexual Activity   Alcohol use: No    Alcohol/week: 0.0 standard drinks of alcohol   Drug use: No   Sexual activity: Not on file  Other Topics Concern   Not on file  Social History Narrative   Patient lives in [place] with his husband and grandson.   Son completed suicide on September 17th of this year, patient found him.   Social Determinants of Health   Financial Resource Strain: Not on file  Food Insecurity: Not on file  Transportation Needs: Not on file  Physical Activity: Not on file  Stress:  Not on file  Social Connections: Not on file  Intimate Partner Violence: Not on file     Allergies  Allergen Reactions   Valium [Diazepam] Swelling    face     Outpatient Medications Prior to Visit  Medication Sig Dispense Refill   Accu-Chek Softclix Lancets lancets TEST BLOOD SUGAR 1 TO 4 TIMES DAILY AS DIRECTED. 400 each 11   albuterol (VENTOLIN HFA) 108 (90 Base) MCG/ACT inhaler Inhale 2 puffs into the lungs every 6 (six) hours as needed for wheezing or shortness of breath. 8 g 0   aspirin EC 81 MG tablet Take 81 mg by mouth at bedtime.     Blood Glucose Monitoring Suppl (ACCU-CHEK GUIDE) w/Device KIT USE AS DIRECTED 1 kit 0   carvedilol (COREG) 12.5 MG tablet TAKE 1 AND 1/2 TABLETS TWICE DAILY 270 tablet 3   clopidogrel (PLAVIX) 75 MG tablet TAKE 1 TABLET EVERY DAY 90 tablet 3   diclofenac Sodium (VOLTAREN) 1 % GEL      furosemide (LASIX) 40 MG tablet TAKE 2 TABLETS ONE TIME DAILY IN THE MORNING AND TAKE 1 TABLET EVERY EVENING (MUST KEEP APPT FOR FUTURE REFILLS) 270 tablet 2   Garlic 500 MG CAPS Take 500 mg by mouth daily.     glucose blood (ACCU-CHEK GUIDE) test strip TEST BLOOD SUGAR 1 TO 4 TIMES DAILY AS DIRECTED 350 strip 11   insulin degludec (TRESIBA FLEXTOUCH) 100 UNIT/ML FlexTouch Pen Inject 16 Units into the skin daily. 15 mL 3   Insulin Pen Needle (BD PEN NEEDLE MICRO U/F) 32G X 6 MM MISC USE NEW NEEDLE FOR EACH INJECTION OF INSULIN, FOUR TIMES DAILY 100 each 5   ipratropium (ATROVENT) 0.03 % nasal spray Place 2 sprays into both nostrils 2 (two) times daily. 30 mL 3   isosorbide mononitrate (IMDUR) 30 MG 24 hr tablet      levothyroxine (SYNTHROID) 100 MCG tablet Take 1 tablet (100 mcg total) by mouth daily before breakfast. 90 tablet 3   nitroGLYCERIN (NITROSTAT) 0.4 MG SL tablet Place 1 tablet (0.4 mg total) under the tongue every 5 (five) minutes as needed for chest pain. 25 tablet 3   oxyCODONE-acetaminophen (PERCOCET/ROXICET) 5-325 MG tablet Take 1 tablet by mouth 2  (two) times daily as needed for severe pain.     potassium chloride SA (KLOR-CON M) 20 MEQ tablet TAKE 1 TABLET TWICE DAILY ON MONDAY, WEDNESDAY AND FRIDAY (DAYS YOU TAKE LASIX/FUROSEMIDE) 78 tablet 10   sertraline (ZOLOFT) 25 MG tablet Take 1 tablet (25 mg total) by mouth daily. 90 tablet 3   simvastatin (ZOCOR) 20 MG tablet Take 1 tablet (20 mg  total) by mouth daily with supper. 90 tablet 3   traZODone (DESYREL) 150 MG tablet Take 1 tablet (150 mg total) by mouth at bedtime. 90 tablet 3   Vitamin D, Ergocalciferol, (DRISDOL) 1.25 MG (50000 UNIT) CAPS capsule      triamcinolone cream (KENALOG) 0.1 % APPLY ONE APPLICATION TOPICALLY TWO TIMES DAILY (Patient not taking: Reported on 03/28/2022) 30 g 0   No facility-administered medications prior to visit.    ROS   Objective:  Physical Exam   Vitals:   06/15/22 1036  BP: 120/60  Pulse: 94  SpO2: 100%  Weight: 148 lb 9.6 oz (67.4 kg)  Height: 5\' 6"  (1.676 m)   100% on *** LPM *** RA BMI Readings from Last 3 Encounters:  06/15/22 23.98 kg/m  06/10/22 23.93 kg/m  05/12/22 23.57 kg/m   Wt Readings from Last 3 Encounters:  06/15/22 148 lb 9.6 oz (67.4 kg)  06/10/22 148 lb 4 oz (67.2 kg)  05/12/22 146 lb (66.2 kg)     CBC    Component Value Date/Time   WBC 7.4 05/12/2022 0852   RBC 4.38 05/12/2022 0852   HGB 13.8 05/12/2022 0852   HGB 15.5 05/14/2019 1704   HCT 40.5 05/12/2022 0852   HCT 44.9 05/14/2019 1704   PLT 222.0 05/12/2022 0852   PLT 235 05/14/2019 1704   MCV 92.5 05/12/2022 0852   MCV 94 05/14/2019 1704   MCH 32.6 05/14/2019 1704   MCH 29.9 08/31/2016 1132   MCHC 34.0 05/12/2022 0852   RDW 14.1 05/12/2022 0852   RDW 12.6 05/14/2019 1704   LYMPHSABS 1.4 10/05/2021 1001   LYMPHSABS 1.2 07/21/2017 1500   MONOABS 0.5 10/05/2021 1001   EOSABS 0.2 10/05/2021 1001   EOSABS 0.2 07/21/2017 1500   BASOSABS 0.0 10/05/2021 1001   BASOSABS 0.0 07/21/2017 1500    ***  Chest Imaging: ***  Pulmonary Functions  Testing Results:     No data to display          FeNO: ***  Pathology: ***  Echocardiogram: ***  Heart Catheterization: ***    Assessment & Plan:     ICD-10-CM   1. Cavitary lesion of lung  J98.4 QuantiFERON-TB Gold Plus    2. Current smoker  F17.200     3. Tobacco abuse  Z72.0       Discussion: ***   Current Outpatient Medications:    Accu-Chek Softclix Lancets lancets, TEST BLOOD SUGAR 1 TO 4 TIMES DAILY AS DIRECTED., Disp: 400 each, Rfl: 11   albuterol (VENTOLIN HFA) 108 (90 Base) MCG/ACT inhaler, Inhale 2 puffs into the lungs every 6 (six) hours as needed for wheezing or shortness of breath., Disp: 8 g, Rfl: 0   aspirin EC 81 MG tablet, Take 81 mg by mouth at bedtime., Disp: , Rfl:    Blood Glucose Monitoring Suppl (ACCU-CHEK GUIDE) w/Device KIT, USE AS DIRECTED, Disp: 1 kit, Rfl: 0   carvedilol (COREG) 12.5 MG tablet, TAKE 1 AND 1/2 TABLETS TWICE DAILY, Disp: 270 tablet, Rfl: 3   clopidogrel (PLAVIX) 75 MG tablet, TAKE 1 TABLET EVERY DAY, Disp: 90 tablet, Rfl: 3   diclofenac Sodium (VOLTAREN) 1 % GEL, , Disp: , Rfl:    furosemide (LASIX) 40 MG tablet, TAKE 2 TABLETS ONE TIME DAILY IN THE MORNING AND TAKE 1 TABLET EVERY EVENING (MUST KEEP APPT FOR FUTURE REFILLS), Disp: 270 tablet, Rfl: 2   Garlic 500 MG CAPS, Take 500 mg by mouth daily., Disp: , Rfl:  glucose blood (ACCU-CHEK GUIDE) test strip, TEST BLOOD SUGAR 1 TO 4 TIMES DAILY AS DIRECTED, Disp: 350 strip, Rfl: 11   insulin degludec (TRESIBA FLEXTOUCH) 100 UNIT/ML FlexTouch Pen, Inject 16 Units into the skin daily., Disp: 15 mL, Rfl: 3   Insulin Pen Needle (BD PEN NEEDLE MICRO U/F) 32G X 6 MM MISC, USE NEW NEEDLE FOR EACH INJECTION OF INSULIN, FOUR TIMES DAILY, Disp: 100 each, Rfl: 5   ipratropium (ATROVENT) 0.03 % nasal spray, Place 2 sprays into both nostrils 2 (two) times daily., Disp: 30 mL, Rfl: 3   isosorbide mononitrate (IMDUR) 30 MG 24 hr tablet, , Disp: , Rfl:    levothyroxine (SYNTHROID) 100 MCG  tablet, Take 1 tablet (100 mcg total) by mouth daily before breakfast., Disp: 90 tablet, Rfl: 3   nitroGLYCERIN (NITROSTAT) 0.4 MG SL tablet, Place 1 tablet (0.4 mg total) under the tongue every 5 (five) minutes as needed for chest pain., Disp: 25 tablet, Rfl: 3   oxyCODONE-acetaminophen (PERCOCET/ROXICET) 5-325 MG tablet, Take 1 tablet by mouth 2 (two) times daily as needed for severe pain., Disp: , Rfl:    potassium chloride SA (KLOR-CON M) 20 MEQ tablet, TAKE 1 TABLET TWICE DAILY ON MONDAY, WEDNESDAY AND FRIDAY (DAYS YOU TAKE LASIX/FUROSEMIDE), Disp: 78 tablet, Rfl: 10   sertraline (ZOLOFT) 25 MG tablet, Take 1 tablet (25 mg total) by mouth daily., Disp: 90 tablet, Rfl: 3   simvastatin (ZOCOR) 20 MG tablet, Take 1 tablet (20 mg total) by mouth daily with supper., Disp: 90 tablet, Rfl: 3   traZODone (DESYREL) 150 MG tablet, Take 1 tablet (150 mg total) by mouth at bedtime., Disp: 90 tablet, Rfl: 3   Vitamin D, Ergocalciferol, (DRISDOL) 1.25 MG (50000 UNIT) CAPS capsule, , Disp: , Rfl:    triamcinolone cream (KENALOG) 0.1 %, APPLY ONE APPLICATION TOPICALLY TWO TIMES DAILY (Patient not taking: Reported on 03/28/2022), Disp: 30 g, Rfl: 0  I spent *** minutes dedicated to the care of this patient on the date of this encounter to include pre-visit review of records, face-to-face time with the patient discussing conditions above, post visit ordering of testing, clinical documentation with the electronic health record, making appropriate referrals as documented, and communicating necessary findings to members of the patients care team.   Josephine Igo, DO [facility] 06/15/2022 10:48 AM

## 2022-06-15 NOTE — Patient Instructions (Signed)
Thank you for visiting Dr. Tonia Brooms at [facility]. Today we recommend the following:  Orders Placed This Encounter  Procedures   Procedural/ Surgical Case Request: ROBOTIC ASSISTED NAVIGATIONAL BRONCHOSCOPY   NM PET Image Initial (PI) Skull Base To Thigh (F-18 FDG)   QuantiFERON-TB Gold Plus   Ambulatory referral to Pulmonology   Please schedule for bronch on 30th  Return in about 27 days (around 07/12/2022) for Kandice Robinsons, NP , after Bronchoscopy.    Please do your part to reduce the spread of COVID-19.

## 2022-06-17 ENCOUNTER — Encounter: Payer: Self-pay | Admitting: *Deleted

## 2022-06-17 LAB — QUANTIFERON-TB GOLD PLUS
Mitogen-NIL: >10 IU/mL
NIL: 0.02 IU/mL
QuantiFERON-TB Gold Plus: POSITIVE — AB
TB1-NIL: 0.45 IU/mL
TB2-NIL: 0.57 IU/mL

## 2022-06-20 ENCOUNTER — Telehealth: Payer: Self-pay | Admitting: Pulmonary Disease

## 2022-06-20 ENCOUNTER — Ambulatory Visit (INDEPENDENT_AMBULATORY_CARE_PROVIDER_SITE_OTHER): Payer: Medicare HMO

## 2022-06-20 DIAGNOSIS — I255 Ischemic cardiomyopathy: Secondary | ICD-10-CM | POA: Diagnosis not present

## 2022-06-20 DIAGNOSIS — I5032 Chronic diastolic (congestive) heart failure: Secondary | ICD-10-CM

## 2022-06-20 NOTE — Telephone Encounter (Signed)
Quantiferon Gold 06/15/22 was positive   I called and spoke with the pt and notified her of positive result  I informed her she needs to stay home and quarantine until she hears further instruction from the Health Dept   Pt verbalized understanding   I called and spoke with Franchot Erichsen with the [facility] Dept [phone]) and notified her of results I have faxed her the CT Chest, OV note, labs, and demographics to her at [phone]  Forwarding to Dr Tonia Brooms as Lorain Childes

## 2022-06-21 ENCOUNTER — Telehealth: Payer: Self-pay | Admitting: Pulmonary Disease

## 2022-06-21 ENCOUNTER — Ambulatory Visit: Payer: Medicare HMO | Attending: Internal Medicine

## 2022-06-21 DIAGNOSIS — Z95810 Presence of automatic (implantable) cardiac defibrillator: Secondary | ICD-10-CM

## 2022-06-21 DIAGNOSIS — I5032 Chronic diastolic (congestive) heart failure: Secondary | ICD-10-CM | POA: Diagnosis not present

## 2022-06-21 LAB — CUP PACEART REMOTE DEVICE CHECK
Battery Remaining Longevity: 4 mo
Battery Remaining Percentage: 3 %
Battery Voltage: 2.62 V
Brady Statistic AP VP Percent: 13 %
Brady Statistic AP VS Percent: 1 %
Brady Statistic AS VP Percent: 87 %
Brady Statistic AS VS Percent: 1 %
Brady Statistic RA Percent Paced: 12 %
Date Time Interrogation Session: 20240414221023
HighPow Impedance: 75 Ohm
HighPow Impedance: 75 Ohm
Implantable Lead Connection Status: 753985
Implantable Lead Connection Status: 753985
Implantable Lead Connection Status: 753985
Implantable Lead Implant Date: 20160406
Implantable Lead Implant Date: 20160406
Implantable Lead Implant Date: 20160406
Implantable Lead Location: 753858
Implantable Lead Location: 753859
Implantable Lead Location: 753860
Implantable Lead Model: 7122
Implantable Pulse Generator Implant Date: 20160406
Lead Channel Impedance Value: 1450 Ohm
Lead Channel Impedance Value: 380 Ohm
Lead Channel Impedance Value: 680 Ohm
Lead Channel Pacing Threshold Amplitude: 0.75 V
Lead Channel Pacing Threshold Amplitude: 0.875 V
Lead Channel Pacing Threshold Amplitude: 0.875 V
Lead Channel Pacing Threshold Pulse Width: 0.5 ms
Lead Channel Pacing Threshold Pulse Width: 0.5 ms
Lead Channel Pacing Threshold Pulse Width: 0.5 ms
Lead Channel Sensing Intrinsic Amplitude: 12 mV
Lead Channel Sensing Intrinsic Amplitude: 5 mV
Lead Channel Setting Pacing Amplitude: 1.875
Lead Channel Setting Pacing Amplitude: 2 V
Lead Channel Setting Pacing Amplitude: 2 V
Lead Channel Setting Pacing Pulse Width: 0.5 ms
Lead Channel Setting Pacing Pulse Width: 0.5 ms
Lead Channel Setting Sensing Sensitivity: 0.5 mV
Pulse Gen Serial Number: 7199559

## 2022-06-21 NOTE — Progress Notes (Signed)
EPIC Encounter for ICM Monitoring  Patient Name: [name] is a 82 y.o. female Date: 06/21/2022 Primary Care Physican: Myrlene Broker, MD Primary Cardiologist: Allyson Sabal Electrophysiologist: Joycelyn Schmid Pacing:  >99%           12/22/2021 Weight: 143-144 lbs  03/11/2022 Weight: 143 - 145 lbs     05/16/2022 Weight: 143-146 lbs    06/21/2022 Weight: 148 lbs   Battery Longevity: 3.6 months                     Spoke with patient and heart failure questions reviewed.  Transmission results reviewed.  Pt asymptomatic for fluid accumulation.  Pt is being tested for TB on 4/17.   Corvue thoracic impedance suggesting normal fluid levels since 3/21.     Prescribed:  Furosemide 40 mg on take 2 tablets (80 mg total) every morning and 1 tablet (40 mg total) every evening.   Potassium 20 mEq On Monday, Wednesday AND Friday TAKE 1 TAB BY MOUTH 2 TIMES A DAY and on the days you take lasix.   Labs: 05/12/2022 Creatinine 1.30, BUN 15, Potassium 4.2, Sodium 138, GFR 38.83 10/05/2021 Creatinine 1.24, BUN 11, Potassium 3.5, Sodium 140, GFR 41.27 06/01/2021 Creatinine 1.46, BUN 18, Potassium 3.8, Sodium 139, GFR 34.01 A complete set of results can be found in Results Review.   Recommendations:   Encouraged to call if experiencing any fluid symptoms.   Follow-up plan: ICM clinic phone appointment on 07/25/2022.   91 day device clinic remote transmission 09/22/2022.     EP/Cardiology Office Visits:   08/08/2022 with Francis Dowse, PA.   Recall 06/18/2022 with Dr Allyson Sabal.     Copy of ICM check sent to Dr. Graciela Husbands.     3 month ICM trend: 06/19/2022.    12-14 Month ICM trend:     Karie Soda, RN 06/21/2022 3:13 PM

## 2022-06-21 NOTE — Telephone Encounter (Signed)
Called patient and she states that she is wanting to talk to Dr Tonia Brooms about TB that she has several questions and wants answers.  Please advise

## 2022-06-22 NOTE — Progress Notes (Signed)
Remote ICD transmission.   

## 2022-06-22 NOTE — Addendum Note (Signed)
Addended by: Elease Etienne A on: 06/22/2022 09:16 AM   Modules accepted: Level of Service

## 2022-06-23 ENCOUNTER — Telehealth: Payer: Self-pay | Admitting: Pulmonary Disease

## 2022-06-23 DIAGNOSIS — R7612 Nonspecific reaction to cell mediated immunity measurement of gamma interferon antigen response without active tuberculosis: Secondary | ICD-10-CM

## 2022-06-23 NOTE — Telephone Encounter (Signed)
[place] call to let us know the ASB smear is positive +3 and there going to run a PCR and hope to get results tomorrow

## 2022-06-24 ENCOUNTER — Telehealth: Payer: Self-pay | Admitting: Pulmonary Disease

## 2022-06-24 NOTE — Telephone Encounter (Signed)
PT calling now as well as Dr. Maury Dus (see below)  for clarity on why Biopsy was cancelled. Adv we were waiting on reply from Dr. Tonia Brooms. Pls call PT as well @ [phone]

## 2022-06-24 NOTE — Telephone Encounter (Signed)
Dr Maury Dus has already left a message.  Will close this one.

## 2022-06-24 NOTE — Telephone Encounter (Signed)
I spoke to Dr Maury Dus and she states she is looking for clarification on why bronch was cancelled.  She states labs were not completed.  I told her I would have Dr Tonia Brooms to call her.

## 2022-06-24 NOTE — Telephone Encounter (Signed)
Referral for infectious

## 2022-06-24 NOTE — Telephone Encounter (Signed)
Referral to infectious disease placed. Nothing further needed.

## 2022-06-24 NOTE — Telephone Encounter (Signed)
I have cancelled bronch and pt's follow up appt with Maralyn Sago.  I called pt to make her aware and she asked it was being cancelled.  I just told her was due to her labs that came back and I would have nurse to call her.  She states ok.

## 2022-06-24 NOTE — Telephone Encounter (Signed)
Dr. Maury Dus the Medical Director from [facility] wants to speak with Dr. Tonia Brooms and or Cordelia Pen. I transferred Dr. Maury Dus to [place] [facility]

## 2022-06-27 NOTE — Telephone Encounter (Incomplete Revision)
Called [facility] Department [phone], Nadyne Coombes is the nurse working with this patient.  + on smear, however, she has a negative PCR.  I requested that they fax over the results.  Fax # provided.  Will await fax.    Fax received.

## 2022-06-27 NOTE — Telephone Encounter (Addendum)
Called [facility] Department [phone], Nadyne Coombes is the nurse working with this patient.  + on smear, however, she has a negative PCR.  I requested that they fax over the results.  Fax # provided.  Will await fax.    Fax received.

## 2022-06-27 NOTE — Telephone Encounter (Signed)
Dr. Tonia Brooms, I called and spoke with the patient.  She was contacted by the ID office, however, she was upset about all the miscommunication and she told them to just forget it.  I talked to her and clarified things for her and let her know that she needs to schedule an appointment with ID.  She stated she would call them back and make an appointment.  I asked her about the sputum sample she provided and if they were doing a culture.  She says she was told it could take 6-8 weeks to get the results of the culture and did not understand why it would take so long.  She was also told to continue to wear a mask.  She wants to make sure she does not give anything to her grandson that lives with her and wants to know if there is anything she needs to do to protect him.  Please advise.  Thank you.

## 2022-06-27 NOTE — Telephone Encounter (Signed)
Called [facility] [phone], Keisha Reid is the nurse working with this patient.  + on smear, however, she has a negative PCR.  I requested that they fax over the results.  Fax # provided.  Will await fax.   

## 2022-06-27 NOTE — Telephone Encounter (Signed)
Fax received from [facility] Department.  This test result was called to the patient, however, not to our office.  Lab result placed in Dr. Myrlene Broker review folder for him to review and then it can be sent to scan to add to our record.

## 2022-06-27 NOTE — Telephone Encounter (Deleted)
Called [facility] Department [phone], Nadyne Coombes is the nurse working with this patient.  + on smear, however, she has a negative PCR.  I requested that they fax over the results.  Fax # provided.  Will await fax.                Called [facility] Department [phone], Nadyne Coombes is the nurse working with this patient.  + on smear, however, she has a negative PCR.  I requested that they fax over the results.  Fax # provided.  Will await fax.

## 2022-06-27 NOTE — Telephone Encounter (Addendum)
Spoke with [name] at [facility] who will fax over sputum results.  One negative PCR and three positive AFB smears.  [name], RMA

## 2022-06-28 ENCOUNTER — Telehealth: Payer: Self-pay | Admitting: Pulmonary Disease

## 2022-06-28 NOTE — Telephone Encounter (Signed)
I will update the preop APP, procedure has been cancelled, see notes from Dr. Tonia Brooms.

## 2022-06-28 NOTE — Telephone Encounter (Signed)
I tried to call the pulmonary office to confirm if anti-platelets need to be held, though no answer at Pulmonology. I will fax a note to their office to please reply and let us know if anti-platelets need to be held.

## 2022-06-28 NOTE — Telephone Encounter (Signed)
Patient is on aspirin and Plavix. Does Dr. Tonia Brooms want patient to hold these prior to bronchoscopy?

## 2022-06-28 NOTE — Telephone Encounter (Signed)
   Pre-operative Risk Assessment    Patient Name: [name]  DOB: Mar 13, 1940 MRN: [id number]      Request for Surgical Clearance    Procedure:   Robotic Assisted Navigational Bronchoscopy  Date of Surgery:  Clearance TBD                                 Surgeon:  Dr. Audie Box  Surgeon's Group or Practice Name:  [facility]  Phone number:  [phone] Fax number:  [phone]   Type of Clearance Requested:   - Medical    Type of Anesthesia:  Not Indicated   Additional requests/questions:  Please fax a copy of clearance to the surgeon's office.  Signed, Bernita Buffy   06/28/2022, 3:23 PM          [facility] at [facility] [address] [place], [place]  [address]  Phone:  [phone]   Fax:  [phone]   Faxed: 06/17/2022           Contact Phone: [phone]   Pre-Operative Risk Assessment   Patient: [name]                 MRN: [id number]           DOB: 16-Jan-1941 Procedure: Navigational Bronchoscopy _x_  Preoperative Risk Assessment Needed (See Below) __  Preoperative Risk Assessment not needed (FYI Only)   Dear Dr. Allyson Sabal,  Your patient is planning to undergo the above procedure.  We would appreciate your evaluation and opinion on risk assessment.  We can NOT  Perform the surgery until this assessment is received by [place].   Thank you for allowing [place] to care for your patient.   Sincerely,   Audie Box, DO   Please check all that apply __  Optimized for surgery from a medical and cardiac standpoint __  Optimized for surgery from a medical standpoint only __  Optimized for surgery from a cardiac standpoint only __  HgB A1C is _______ (Less than 7.5 is required for surgery) __  BMI is _______ (Less than 40 is required for surgery) __ Anticoagulation / Medication: __________________________________ is ok to stop       _____days prior to surgery   **Please send recent notes, labs, EKG or special  studies with this form   Physician Signature:_________________________________________Date:_________________   Additional Instructions:___________________________________________________________________ _____________________________________________________________________________ _____________________________________________________________________________      [facility] at [facility] [place], [phone]                        1

## 2022-06-28 NOTE — Telephone Encounter (Signed)
Dr. Maury Dus from [facility] Dept. Direct Line: [phone]   Would like Dr. Tonia Brooms to call her. Please see past/signed Tel Encounters

## 2022-06-28 NOTE — Telephone Encounter (Signed)
Called and spoke with Dr. Maury Dus who is requesting a call from Dr. Tonia Brooms to further discuss pt to make sure that everyone is on the right page with what is going on with her care. Dr. Maury Dus asked if the bronch was still going to happen and I told Dr. Maury Dus that the Julieta Bellini has been cancelled and she wanted to further discuss this with  Dr. Tonia Brooms to get the logic behind why the bronch was cancelled and again to make sure everyone was all on the right page for what is going to be happening.  Dr. Maury Dus can be reached at [phone].  Routing encounter to Dr. Tonia Brooms.

## 2022-06-28 NOTE — Telephone Encounter (Signed)
-----   Message from Runell Gess, MD sent at 06/17/2022 12:18 PM EDT ----- Regarding: RE: Clinical Carma Lair, can you have Kenyata see an APP for pre op clearance?  Thx. JJB ----- Message ----- From: Christen Butter, CMA Sent: 06/17/2022   9:17 AM EDT To: Runell Gess, MD Subject: Clinical

## 2022-06-30 DIAGNOSIS — G894 Chronic pain syndrome: Secondary | ICD-10-CM | POA: Diagnosis not present

## 2022-06-30 DIAGNOSIS — Z9889 Other specified postprocedural states: Secondary | ICD-10-CM | POA: Diagnosis not present

## 2022-06-30 DIAGNOSIS — M5416 Radiculopathy, lumbar region: Secondary | ICD-10-CM | POA: Diagnosis not present

## 2022-06-30 DIAGNOSIS — F1190 Opioid use, unspecified, uncomplicated: Secondary | ICD-10-CM | POA: Diagnosis not present

## 2022-07-01 ENCOUNTER — Ambulatory Visit (HOSPITAL_COMMUNITY): Payer: Medicare HMO

## 2022-07-05 ENCOUNTER — Encounter (HOSPITAL_COMMUNITY): Payer: Self-pay

## 2022-07-05 ENCOUNTER — Ambulatory Visit (HOSPITAL_COMMUNITY): Admit: 2022-07-05 | Payer: Medicare HMO | Admitting: Pulmonary Disease

## 2022-07-05 ENCOUNTER — Ambulatory Visit (INDEPENDENT_AMBULATORY_CARE_PROVIDER_SITE_OTHER): Payer: Medicare HMO | Admitting: Internal Medicine

## 2022-07-05 ENCOUNTER — Encounter: Payer: Self-pay | Admitting: Internal Medicine

## 2022-07-05 ENCOUNTER — Ambulatory Visit (INDEPENDENT_AMBULATORY_CARE_PROVIDER_SITE_OTHER): Payer: Medicare HMO

## 2022-07-05 VITALS — BP 116/60 | HR 75 | Temp 98.2°F | Ht 66.0 in | Wt 151.0 lb

## 2022-07-05 DIAGNOSIS — R109 Unspecified abdominal pain: Secondary | ICD-10-CM | POA: Diagnosis not present

## 2022-07-05 DIAGNOSIS — G8929 Other chronic pain: Secondary | ICD-10-CM | POA: Diagnosis not present

## 2022-07-05 DIAGNOSIS — G4733 Obstructive sleep apnea (adult) (pediatric): Secondary | ICD-10-CM

## 2022-07-05 DIAGNOSIS — M546 Pain in thoracic spine: Secondary | ICD-10-CM

## 2022-07-05 DIAGNOSIS — J984 Other disorders of lung: Secondary | ICD-10-CM | POA: Diagnosis not present

## 2022-07-05 DIAGNOSIS — K5903 Drug induced constipation: Secondary | ICD-10-CM

## 2022-07-05 DIAGNOSIS — R1010 Upper abdominal pain, unspecified: Secondary | ICD-10-CM

## 2022-07-05 DIAGNOSIS — G4700 Insomnia, unspecified: Secondary | ICD-10-CM | POA: Diagnosis not present

## 2022-07-05 SURGERY — BRONCHOSCOPY, WITH BIOPSY USING ELECTROMAGNETIC NAVIGATION
Anesthesia: General | Laterality: Bilateral

## 2022-07-05 MED ORDER — RAMELTEON 8 MG PO TABS: 8.0000 mg | ORAL_TABLET | Freq: Every day | ORAL | 3 refills | Status: DC

## 2022-07-05 NOTE — Assessment & Plan Note (Signed)
Suspect this is related to her upper abdomen pain and checking x-ray today for stool burden. No signs of obstruction on exam today.

## 2022-07-05 NOTE — Assessment & Plan Note (Signed)
Worse in last few weeks and abruptly stopped trazodone 150 mg qhs in the last week or so. Sleeping about 1 hour per night. Will add ramelteon to help 8 mg qhs for sleep. Due to concurrent oxycodone we only have limited safe options. Ambien, benzos, lunesta, sonata are not options due to concurrent oxycodone treatment.

## 2022-07-05 NOTE — Assessment & Plan Note (Signed)
Seeing ID this week. Reviewed positive quantiferon gold with her and possibility she will do sputum culture. Imaging still consistent more with mycobacterium. She is having stable symptoms, advised to quit smoking. Awaiting PET 07/28/22 as well.

## 2022-07-05 NOTE — Assessment & Plan Note (Signed)
We have copy of sleep study from 2006. Was on CPAP and slept well with it. Has been off for unknown time. Ordered CPAP device. She is aware she may need updated sleep study. Is having severe insomnia and fatigue at this time.

## 2022-07-05 NOTE — Progress Notes (Signed)
   Subjective:   Patient ID: [name], female    DOB: [birth year], 82 y.o.   MRN: [id number]  HPI The patient is an 82 YO female coming in for stomach issues and other problems.  Review of Systems  Constitutional:  Positive for activity change and fatigue.  HENT: Negative.    Eyes: Negative.   Respiratory:  Negative for cough, chest tightness and shortness of breath.   Cardiovascular:  Negative for chest pain, palpitations and leg swelling.  Gastrointestinal:  Positive for abdominal pain, constipation and diarrhea. Negative for abdominal distention, nausea and vomiting.  Musculoskeletal: Negative.   Skin: Negative.   Neurological: Negative.   Psychiatric/Behavioral:  Positive for sleep disturbance.     Objective:  Physical Exam Constitutional:      Appearance: She is well-developed.  HENT:     Head: Normocephalic and atraumatic.  Cardiovascular:     Rate and Rhythm: Normal rate and regular rhythm.  Pulmonary:     Effort: Pulmonary effort is normal. No respiratory distress.     Breath sounds: Normal breath sounds. No wheezing or rales.  Abdominal:     General: Bowel sounds are normal. There is no distension.     Palpations: Abdomen is soft.     Tenderness: There is abdominal tenderness. There is no rebound.     Comments: Tenderness upper abdomen no increase with palpation and no rebound or guarding.   Musculoskeletal:     Cervical back: Normal range of motion.  Skin:    General: Skin is warm and dry.  Neurological:     Mental Status: She is alert and oriented to person, place, and time.     Coordination: Coordination normal.     Vitals:   07/05/22 0801  BP: 116/60  Pulse: 75  Temp: 98.2 F (36.8 C)  TempSrc: Oral  SpO2: 99%  Weight: 151 lb (68.5 kg)  Height: 5\' 6"  (1.676 m)    Assessment & Plan:  Visit time 25 minutes in face to face communication with patient and coordination of care, additional 5 minutes spent in record review, coordination or care,  ordering tests, communicating/referring to other healthcare professionals, documenting in medical records all on the same day of the visit for total time 30 minutes spent on the visit.

## 2022-07-05 NOTE — Assessment & Plan Note (Signed)
Checking x-ray abdomen as chronic constipation seems likely cause. Reviewed CT abdomen/pelvis from 2016 without acute problems. Prior gallbladder removal. Recent CMP normal March. This has been present for months and worse recently. If x-ray without stool burden will need CT abd/pelvis. If high stool burden will need more aggressive bowel regimen. Discussed overflow incontinence and diarrhea which she likely has. She admits no BM in several days. No signs of obstruction on exam today.

## 2022-07-05 NOTE — Assessment & Plan Note (Signed)
Stable overall and she does have some scoliosis. It is unclear if this is related to upper abdomen pain.

## 2022-07-05 NOTE — Patient Instructions (Signed)
We have ramelteon to take at night time for the sleep.  We will get the CPAP ordered to get you sleeping better again.  We will check an x-ray of the stomach to see if we can figure out the pain.

## 2022-07-06 ENCOUNTER — Telehealth: Payer: Self-pay | Admitting: Internal Medicine

## 2022-07-06 NOTE — Telephone Encounter (Signed)
Pt want to know if she can get alternative Rx for ramelteon (ROZEREM) 8 MG tablet because the price of that medication is 100 Dollars and need something cheaper.

## 2022-07-07 ENCOUNTER — Other Ambulatory Visit: Payer: Self-pay

## 2022-07-07 ENCOUNTER — Encounter: Payer: Self-pay | Admitting: Internal Medicine

## 2022-07-07 ENCOUNTER — Ambulatory Visit: Payer: Medicare HMO | Admitting: Internal Medicine

## 2022-07-07 VITALS — BP 115/80 | HR 74 | Temp 98.0°F | Ht 66.0 in | Wt 150.0 lb

## 2022-07-07 DIAGNOSIS — J984 Other disorders of lung: Secondary | ICD-10-CM

## 2022-07-07 MED ORDER — MIRTAZAPINE 15 MG PO TABS
15.0000 mg | ORAL_TABLET | Freq: Every day | ORAL | 0 refills | Status: DC
Start: 2022-07-07 — End: 2022-09-12

## 2022-07-07 NOTE — Telephone Encounter (Signed)
Sent to local pharmacy, can take 1-2 weeks to work fully for sleep

## 2022-07-07 NOTE — Telephone Encounter (Signed)
Notified pt rx has been sent to pharmacy../lmb 

## 2022-07-07 NOTE — Telephone Encounter (Signed)
Does is need PA? We have limited options which will not interact with her pain medication. She can try melatonin over the counter if desired. Another option would be prescription remeron does she want to try that?

## 2022-07-07 NOTE — Patient Instructions (Signed)
  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  What are nontuberculous mycobacteria? Nontuberculous mycobacteria (abbreviated NTM) are germs that are related to tuberculosis but that live in the environment, primarily in the soil and water. The difference between NTM and tuberculosis is that NTM are not spread by person to person contact and are not considered to be contagious.  NTM infection is acquired from the environment. We do not know how or why people become infected with NTM germs. Although we can easily recover the germ from soil, water, and air samples, most people do not become sick from this organism. Scientists and physicians who study these germs think that perhaps the people who become infected have some defect in the structure or function of their lungs or in their immune systems. People who have damaged lung tissue from previous tuberculosis, heavy smoking, and a breathing tube disease called bronchiectasis, are three recognized risk factors which make you more likely to develop this disease. Disease in men most commonly relates to heavy smoking, while disease in women most commonly relates to bronchiectasis.  What are the names of some common nontuberculous mycobacteria? The most common NTM germs involved in human infection are Mycobacterium avium complex (MAC), M. kansasii, M. chelonae, and M. Abscessus.  Can the infection spread outside my lungs? In general, the infection does not spread outside the lungs in persons with an otherwise normal immune system. People with a severely compromised immune system, such as persons with HIV infection, persons who are on medications that suppress the immune system (such as prednisone), or persons who have had organ or bone marrow transplants may develop disease outside the lungs.  Can this infection be cured? NTM infections in the lung are  hard to treat, requiring multiple antibiotics for a long period of time (usually at least a year). Treatment may consist of pills along with intravenous antibiotics. Even with the best available treatments, the cure rate for NTM lung disease is often low (for example, about 55-65% for MAC lung disease). Also, a person can become reinfected after treatment is completed.  Will this infection kill me? In general, NTM lung infections are slow-moving; most people will not die from the infection, but will die with the infection. Some patients can have these infections for decades and live relatively healthy and productive lives.  What can I do besides take antibiotics to help my body with this infection? There are a number of things you can do to keep yourself healthy. Regular aerobic exercise (brisk walking, bicycling, swimming, etc.) is really important for overall health and to optimize lung function. Doing these activities for at least 30 minutes at least 3 (preferably 5 or more) days per week will help you stay healthy. Getting a flu shot every year is also important, and you should get the pneumonia shot (Pneumovax) at least once. Don't smoke, and stay away from people who are smoking. Wash your hands regularly or use hand sanitizer after being around crowds or in public places. 

## 2022-07-07 NOTE — Progress Notes (Signed)
[facility]  Reason for Consult: Positive AFB smear  Referring Provider: Dr. Tonia Brooms   HPI:    [name] is a 82 y.o. female with PMHx as below who presents to the clinic for positive AFB smear.  Patient was seen in the pulmonary office last month on 06/15/2022 by Dr. Tonia Brooms.  She has a past medical history significant for anxiety, pain, CAD, diabetes, GERD, hypertension, hyperlipidemia, and longstanding tobacco use.  She was referred to pulmonary after having abnormal CT chest on 06/09/2022 that showed interval progression of the left upper lobe pulmonary nodule when compared to prior imaging that was done in May 2022.  Per Dr. Myrlene Broker note from 4/10, patient has had lung nodules documented since a CT neck in 2021 as well as follow-up CT imaging showing continued presence of disease in February 2022.  A short-term CT in May 2022 showed persistent nodules and she was recommended to have a repeat scan in March 2023, however, no further follow-up was obtained until recently having another scan in April 2024 with an enlarging cavitary lesion in the left upper lobe.  She had no obvious TB risk factors elicited and no other infectious symptoms other than fatigue.  No significant pulmonary symptoms and no fevers, chills.  She had a QuantiFERON sent which was positive on 06/15/2022.  She had induced sputum samples collected from the health department due to concern for pulmonary TB on 4/17 which were AFB smear positive.  However, her MTB/RIF PCR (gene expert) did not detect MTB complex.  She was subsequently referred to our clinic and presents today for follow-up.  She reports being unsure as to why she is here today.  She reports the Health Dept has told her the sputum samples submitted have been negative and that she has submitted at least 9 specimens.  She reports an extensive smoking history and still smokes about 1 pack per day. She has a chronic smokers cough that is not  worsening.  No significant SOB, no fevers, no exertional dyspnea.  She reports "working like a dog" in her yard recently with no issues.    Patient's Medications  New Prescriptions   No medications on file  Previous Medications   ACCU-CHEK SOFTCLIX LANCETS LANCETS    TEST BLOOD SUGAR 1 TO 4 TIMES DAILY AS DIRECTED.   ALBUTEROL (VENTOLIN HFA) 108 (90 BASE) MCG/ACT INHALER    Inhale 2 puffs into the lungs every 6 (six) hours as needed for wheezing or shortness of breath.   ALCOHOL SWABS (DROPSAFE ALCOHOL PREP) 70 % PADS    Apply topically.   ASPIRIN EC 81 MG TABLET    Take 81 mg by mouth at bedtime.   BLOOD GLUCOSE MONITORING SUPPL (ACCU-CHEK GUIDE) W/DEVICE KIT    USE AS DIRECTED   CARVEDILOL (COREG) 12.5 MG TABLET    TAKE 1 AND 1/2 TABLETS TWICE DAILY   CLOPIDOGREL (PLAVIX) 75 MG TABLET    TAKE 1 TABLET EVERY DAY   DICLOFENAC SODIUM (VOLTAREN) 1 % GEL       FUROSEMIDE (LASIX) 40 MG TABLET    TAKE 2 TABLETS ONE TIME DAILY IN THE MORNING AND TAKE 1 TABLET EVERY EVENING (MUST KEEP APPT FOR FUTURE REFILLS)   GARLIC 500 MG CAPS    Take 500 mg by mouth daily.   GLUCOSE BLOOD (ACCU-CHEK GUIDE) TEST STRIP    TEST BLOOD SUGAR 1 TO 4 TIMES DAILY AS DIRECTED   INSULIN DEGLUDEC (TRESIBA  FLEXTOUCH) 100 UNIT/ML FLEXTOUCH PEN    Inject 16 Units into the skin daily.   INSULIN PEN NEEDLE (BD PEN NEEDLE MICRO U/F) 32G X 6 MM MISC    USE NEW NEEDLE FOR EACH INJECTION OF INSULIN, FOUR TIMES DAILY   IPRATROPIUM (ATROVENT) 0.03 % NASAL SPRAY    Place 2 sprays into both nostrils 2 (two) times daily.   ISOSORBIDE MONONITRATE (IMDUR) 30 MG 24 HR TABLET       LEVOTHYROXINE (SYNTHROID) 100 MCG TABLET    Take 1 tablet (100 mcg total) by mouth daily before breakfast.   MIRTAZAPINE (REMERON) 15 MG TABLET    Take 1 tablet (15 mg total) by mouth at bedtime.   NITROGLYCERIN (NITROSTAT) 0.4 MG SL TABLET    Place 1 tablet (0.4 mg total) under the tongue every 5 (five) minutes as needed for chest pain.    OXYCODONE-ACETAMINOPHEN (PERCOCET/ROXICET) 5-325 MG TABLET    Take 1 tablet by mouth 2 (two) times daily as needed for severe pain.   POTASSIUM CHLORIDE SA (KLOR-CON M) 20 MEQ TABLET    TAKE 1 TABLET TWICE DAILY ON MONDAY, WEDNESDAY AND FRIDAY (DAYS YOU TAKE LASIX/FUROSEMIDE)   RAMELTEON (ROZEREM) 8 MG TABLET    Take 1 tablet (8 mg total) by mouth at bedtime.   SERTRALINE (ZOLOFT) 25 MG TABLET    Take 1 tablet (25 mg total) by mouth daily.   SIMVASTATIN (ZOCOR) 20 MG TABLET    Take 1 tablet (20 mg total) by mouth daily with supper.   TRIAMCINOLONE CREAM (KENALOG) 0.1 %    APPLY ONE APPLICATION TOPICALLY TWO TIMES DAILY   VITAMIN D, ERGOCALCIFEROL, (DRISDOL) 1.25 MG (50000 UNIT) CAPS CAPSULE      Modified Medications   No medications on file  Discontinued Medications   No medications on file      Past Medical History:  Diagnosis Date   Anxiety    Arthritis    Chronic pain    a. Prior h/o chronic pain on methadone.   Chronic systolic CHF (congestive heart failure) (HCC)    a. mixed ischemic/non-ischemic cardiomyopathy. b. s/p CRT-D in 06/2014.   CKD (chronic kidney disease), stage III (HCC)    Complication of anesthesia    hard time waking her up from general surgery   Coronary artery disease    a. remote RCA stenting in 2008 with non-DES. b. Cath 06/2014 following abnormal nuc: Stable, unchanged from prior cath, patent stent and 40% LM.   Diabetes mellitus (HCC)    GERD (gastroesophageal reflux disease)    Headache    Hyperlipidemia    Hypertension    LBBB (left bundle branch block)    Nonischemic cardiomyopathy (HCC)    OSA (obstructive sleep apnea)    AHI-9.77/hr, during REM-50.32/hr   Tobacco abuse     Social History   Tobacco Use   Smoking status: Every Day    Packs/day: 2.00    Years: 58.00    Additional pack years: 0.00    Total pack years: 116.00    Types: Cigarettes    Start date: 12/26/1951   Smokeless tobacco: Never   Tobacco comments:    Smoke 7 packs of  cigarettes a week. 06/15/2022 Tay  Vaping Use   Vaping Use: Never used  Substance Use Topics   Alcohol use: No    Alcohol/week: 0.0 standard drinks of alcohol   Drug use: No    Family History  Problem Relation Age of Onset   Stroke Mother  Hypertension Mother    Coronary artery disease Father    Stroke Brother    Heart disease Brother    Other Brother        H1N1 VIRUS   Healthy Sister    Healthy Sister     Allergies  Allergen Reactions   Valium [Diazepam] Swelling    face    Review of Systems  All other systems reviewed and are negative.  Except as otherwise noted above.    OBJECTIVE:    Vitals:   07/07/22 1432  BP: 115/80  Pulse: 74  Temp: 98 F (36.7 C)  TempSrc: Oral  SpO2: 94%  Weight: 150 lb (68 kg)  Height: 5\' 6"  (1.676 m)     Body mass index is 24.21 kg/m.  Physical Exam Constitutional:      Appearance: Normal appearance.  HENT:     Head: Normocephalic and atraumatic.  Eyes:     Extraocular Movements: Extraocular movements intact.     Conjunctiva/sclera: Conjunctivae normal.  Pulmonary:     Effort: Pulmonary effort is normal. No respiratory distress.  Abdominal:     General: There is no distension.     Palpations: Abdomen is soft.  Musculoskeletal:        General: Normal range of motion.     Cervical back: Normal range of motion and neck supple.  Skin:    General: Skin is warm and dry.  Neurological:     General: No focal deficit present.     Mental Status: She is alert and oriented to person, place, and time.  Psychiatric:        Mood and Affect: Mood normal.        Behavior: Behavior normal.      Labs and Microbiology:     Latest Ref Rng & Units 05/12/2022    8:52 AM 10/05/2021   10:01 AM 06/01/2021    9:49 AM  CBC  WBC 4.0 - 10.5 K/uL 7.4  6.6  8.9   Hemoglobin 12.0 - 15.0 g/dL 40.9  81.1  91.4   Hematocrit 36.0 - 46.0 % 40.5  42.0  42.5   Platelets 150.0 - 400.0 K/uL 222.0  201.0  238.0       Latest Ref Rng & Units  05/12/2022    8:52 AM 10/05/2021   10:01 AM 06/01/2021    9:49 AM  CMP  Glucose 70 - 99 mg/dL 782  956  213   BUN 6 - 23 mg/dL 15  11  18    Creatinine 0.40 - 1.20 mg/dL 0.86  5.78  4.69   Sodium 135 - 145 mEq/L 138  140  139   Potassium 3.5 - 5.1 mEq/L 4.2  3.5  3.8   Chloride 96 - 112 mEq/L 98  100  98   CO2 19 - 32 mEq/L 31  32  31   Calcium 8.4 - 10.5 mg/dL 9.9  9.8  62.9   Total Protein 6.0 - 8.3 g/dL 7.0  7.2  7.0   Total Bilirubin 0.2 - 1.2 mg/dL 0.5  0.3  0.7   Alkaline Phos 39 - 117 U/L 67  79  69   AST 0 - 37 U/L 17  15  12    ALT 0 - 35 U/L 11  11  9       No results found for this or any previous visit (from the past 240 hour(s)).  Imaging: CT  CHEST 06/09/22   IMPRESSION: 1. Significant interval progression of left  upper lobe pulmonary nodularity compared to prior imaging from May of 2022. The previously identified small 1 cm pulmonary nodule has enlarged to a maximum of 3.6 cm and demonstrates central cavitation. There are surrounding ground-glass inflammatory changes, bronchiectasis and satellite nodules. Several small bronchi lead directly into the cavitary mass. Differential considerations include squamous cell bronchogenic carcinoma, progressive atypical mycobacterial infection (MAI), fungal infection, and in the appropriate clinical setting, tuberculosis. Recommend referral to pulmonology for endobronchial evaluation and sampling. 2. Multiple additional small scattered pulmonary nodules some of which were previously present, and some of which are new. Overall, these findings are most suggestive of a chronic indolent atypical infection such as MAI. 3. Extensive small centrilobular pulmonary nodules and ground-glass attenuation airspace opacities in a peribronchovascular distribution throughout the left lower lobe suggests an active infectious/inflammatory process such as bronchopneumonia. 4. Biventricular cardiac rhythm maintenance device. 5. Aortic and coronary  artery atherosclerotic vascular calcifications.     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  I have tried to reach the health department today and was told she has 1 positive smear and 2 others that are pending.  I have asked for updated results to be faxed to our clinic.    I discussed with patient the criteria for treatment consisting of radiographic findings, microbiologic criteria, and symptomatology. She is relatively asymptomatic but will strongly have to consider treatment based on cavitary disease because this type of disease can be associated with rapid progression and destructive disease, however, would prefer to establish specific microbiologic diagnosis first.  We also talked about the difficulty treating NTM with multiple antibiotics over 12-18 months with risk of toxicity, difficulty eradicating infection, and high risk of recurrence even with successful treatment.  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 I will see her back again in 4 weeks to discuss treatment options once more results are back from the health department.       Vedia Coffer for Infectious [facility] 07/07/2022, 3:18 PM    I have personally spent 45 minutes involved in face-to-face and non-face-to-face activities for this patient on the day of the visit. Professional time spent includes the following activities: Preparing to see the patient (review of tests), Obtaining and/or reviewing separately obtained history (admission/discharge record), Performing a medically appropriate examination and/or evaluation , Ordering medications/tests/procedures, referring and communicating with other health care professionals, Documenting clinical information in the EMR,  Independently interpreting results (not separately reported), Communicating results to the patient/family/caregiver, Counseling and educating the patient/family/caregiver and Care coordination (not separately reported).

## 2022-07-07 NOTE — Assessment & Plan Note (Addendum)
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  I have tried to reach the health department today and was told she has 1 positive smear and 2 others that are pending.  I have asked for updated results to be faxed to our clinic.    I discussed with patient the criteria for treatment consisting of radiographic findings, microbiologic criteria, and symptomatology. She is relatively asymptomatic but will strongly have to consider treatment based on cavitary disease because this type of disease can be associated with rapid progression and destructive disease, however, would prefer to establish specific microbiologic diagnosis first.  We also talked about the difficulty treating NTM with multiple antibiotics over 12-18 months with risk of toxicity, difficulty eradicating infection, and high risk of recurrence even with successful treatment.  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 I will see her back again in 4 weeks to discuss treatment options once more results are back from the health department.

## 2022-07-07 NOTE — Telephone Encounter (Signed)
Called pt gave her MD response. She is not sure abt needing a PA, but will do PA for meds. She did states the melatonin does not work for her. Was on trazodone but started getting RLS sxs she stop. She states she would try Remeron if that would help.Marland KitchenRaechel Chute

## 2022-07-11 ENCOUNTER — Other Ambulatory Visit (HOSPITAL_COMMUNITY): Payer: Medicare HMO

## 2022-07-11 ENCOUNTER — Telehealth: Payer: Self-pay | Admitting: Cardiovascular Disease

## 2022-07-11 NOTE — Telephone Encounter (Signed)
Pt  called in to ask "if a pt has a memory problem and no one else could find anything wrong, could it be a result of an undiagnosed heart problem?" Please advise.

## 2022-07-11 NOTE — Telephone Encounter (Signed)
Patient states this is not for her but someone else.  She will have the actual patient speak with their doctor

## 2022-07-12 ENCOUNTER — Telehealth: Payer: Self-pay

## 2022-07-12 ENCOUNTER — Ambulatory Visit: Payer: Medicare HMO | Admitting: Acute Care

## 2022-07-12 ENCOUNTER — Telehealth: Payer: Self-pay | Admitting: Internal Medicine

## 2022-07-12 NOTE — Telephone Encounter (Signed)
Patient called and said she and Dr. Okey Dupre spoke about her needing a C-Pap. She wanted to know what the next steps are for getting that. She would like a call back at [phone].

## 2022-07-12 NOTE — Telephone Encounter (Signed)
I know this is frustrating the waiting, but once we get more information back about identifying the type of NTM we will have a much better plan in mind for initial treatment options.  I would hate to rush to put her on something that she has undeserved side effects to and would recommend we get her back as scheduled with Greig Castilla to review next steps.   I can completely appreciate her anxiety over starting treatment as soon as she can - we just need the right game plan to make her as successful as we can. Even if we start treatment, we cannot be 100% reassured this will prevent anything "developing;" bacteria can be quite tricky and does not always play by any rule book.

## 2022-07-12 NOTE — Telephone Encounter (Signed)
We have already ordered this and will work with home health company on getting it to her.

## 2022-07-12 NOTE — Telephone Encounter (Signed)
Spoke with [name] and relayed [employer]. Patient asked, "Is that it?" And then disconnected the call.   Sandie Ano, RN

## 2022-07-12 NOTE — Telephone Encounter (Signed)
Patient called anxious about starting treatment. She says she would like to go ahead and be prescribed antibiotics. She reports that her PCP expressed surprise at the fact that she has not been started on treatment.   Relayed per Dr. Philis Pique last office note that he is waiting on a few more test results to come back and will discuss treatment options with her at upcoming appointment. She is concerned that this is too long to wait.   Emphasized the importance of having all of the information before making a treatment decision and assured her that provider was comfortable waiting to discuss treatment until her next appointment.   She became upset and states "I'm gonna sue somebody if I develop something." She would like to speak with the provider.   Advised her that Dr. Earlene Plater is not in the office this week, but that her concerns would be shared with the provider covering for him.   Sandie Ano, RN

## 2022-07-13 ENCOUNTER — Telehealth: Payer: Self-pay | Admitting: Pulmonary Disease

## 2022-07-13 NOTE — Telephone Encounter (Signed)
Patient called back to inform the office she does not need another sleep study per [employer], who called her to let her know.  If there are any other questions, please call patient at [phone]

## 2022-07-13 NOTE — Telephone Encounter (Signed)
Called pt gave MD response. Pt states they called her yesterday. She has to get retested bcz it been so long ago since she got the C-Pap machine. Inform her she will need to contact her pulmonologist to get recheck.Marland KitchenRaechel Chute

## 2022-07-13 NOTE — Telephone Encounter (Signed)
Patient states needs sleep study for sleep apnea. Patient phone number is [phone].

## 2022-07-14 NOTE — Telephone Encounter (Signed)
Noted     Closing encounter

## 2022-07-18 NOTE — Telephone Encounter (Signed)
ZO:[id number] 8MG  tablets   Key :  [id number]  Sent to plan awaiting determination

## 2022-07-19 NOTE — Telephone Encounter (Signed)
Rec'd insurance determination. Med was denied. It states " Denied on May 13. The drug you asked for is not listed in your preferred drug list (formulary). The preferred drug(s) are: Belsomra tablet and zolpidem tablet

## 2022-07-19 NOTE — Telephone Encounter (Signed)
She is unable to take either of those medications. Are there any other covered alternatives?

## 2022-07-19 NOTE — Telephone Encounter (Signed)
Left voicemail for Nadyne Coombes, TB nurse with [facility], requesting call back regarding AFB smears/cultures or to fax results to triage.   [phone] --> press option for clinical services  Sandie Ano, RN

## 2022-07-20 ENCOUNTER — Telehealth: Payer: Self-pay | Admitting: Internal Medicine

## 2022-07-20 NOTE — Telephone Encounter (Signed)
Patient called and said she has been speaking with Dr. Okey Dupre about something to help her sleep. She said someone from [facility] recommended Doxepin 25 mg. She was told it is not technically used for sleep, but some doctors will use it. She would like a call back at [phone].

## 2022-07-20 NOTE — Telephone Encounter (Signed)
Spoke with patient and she is going to call to see what her other alternatives are

## 2022-07-20 NOTE — Telephone Encounter (Signed)
Faxed results received today and given to Dr. Earlene Plater

## 2022-07-21 ENCOUNTER — Ambulatory Visit (INDEPENDENT_AMBULATORY_CARE_PROVIDER_SITE_OTHER): Payer: Medicare HMO

## 2022-07-21 DIAGNOSIS — I428 Other cardiomyopathies: Secondary | ICD-10-CM

## 2022-07-21 LAB — CUP PACEART REMOTE DEVICE CHECK
Battery Remaining Longevity: 3 mo
Battery Remaining Percentage: 3 %
Battery Voltage: 2.6 V
Brady Statistic AP VP Percent: 12 %
Brady Statistic AP VS Percent: 1 %
Brady Statistic AS VP Percent: 88 %
Brady Statistic AS VS Percent: 1 %
Brady Statistic RA Percent Paced: 12 %
Date Time Interrogation Session: 20240516025545
HighPow Impedance: 78 Ohm
HighPow Impedance: 78 Ohm
Implantable Lead Connection Status: 753985
Implantable Lead Connection Status: 753985
Implantable Lead Connection Status: 753985
Implantable Lead Implant Date: 20160406
Implantable Lead Implant Date: 20160406
Implantable Lead Implant Date: 20160406
Implantable Lead Location: 753858
Implantable Lead Location: 753859
Implantable Lead Location: 753860
Implantable Lead Model: 7122
Implantable Pulse Generator Implant Date: 20160406
Lead Channel Impedance Value: 1450 Ohm
Lead Channel Impedance Value: 360 Ohm
Lead Channel Impedance Value: 680 Ohm
Lead Channel Pacing Threshold Amplitude: 0.75 V
Lead Channel Pacing Threshold Amplitude: 0.75 V
Lead Channel Pacing Threshold Amplitude: 0.875 V
Lead Channel Pacing Threshold Pulse Width: 0.5 ms
Lead Channel Pacing Threshold Pulse Width: 0.5 ms
Lead Channel Pacing Threshold Pulse Width: 0.5 ms
Lead Channel Sensing Intrinsic Amplitude: 12 mV
Lead Channel Sensing Intrinsic Amplitude: 5 mV
Lead Channel Setting Pacing Amplitude: 1.75 V
Lead Channel Setting Pacing Amplitude: 2 V
Lead Channel Setting Pacing Amplitude: 2 V
Lead Channel Setting Pacing Pulse Width: 0.5 ms
Lead Channel Setting Pacing Pulse Width: 0.5 ms
Lead Channel Setting Sensing Sensitivity: 0.5 mV
Pulse Gen Serial Number: 7199559

## 2022-07-21 NOTE — Telephone Encounter (Signed)
Tried calling pt no answer and no vm...Raechel Chute

## 2022-07-21 NOTE — Telephone Encounter (Signed)
Spoke with patient, discussed that provider now has enough information to start treatment. Offered earlier appointment, she would like to wait until 6/4 to follow up with Dr. Earlene Plater.   Sandie Ano, RN

## 2022-07-21 NOTE — Telephone Encounter (Signed)
Dexepin is not safe with her other medications. We could increase remeron to 30 mg at night time if she wants.

## 2022-07-25 ENCOUNTER — Ambulatory Visit: Payer: Medicare HMO | Attending: Internal Medicine

## 2022-07-25 DIAGNOSIS — I5032 Chronic diastolic (congestive) heart failure: Secondary | ICD-10-CM

## 2022-07-25 DIAGNOSIS — Z95810 Presence of automatic (implantable) cardiac defibrillator: Secondary | ICD-10-CM

## 2022-07-27 ENCOUNTER — Telehealth: Payer: Self-pay

## 2022-07-27 NOTE — Progress Notes (Addendum)
EPIC Encounter for ICM Monitoring  Patient Name: [name] is a 82 y.o. female Date: 07/27/2022 Primary Care Physican: Myrlene Broker, MD Primary Cardiologist: Allyson Sabal Electrophysiologist: Joycelyn Schmid Pacing:  >99%           12/22/2021 Weight: 143-144 lbs  03/11/2022 Weight: 143 - 145 lbs     05/16/2022 Weight: 143-146 lbs    06/21/2022 Weight: 148 lbs   Battery Longevity: ERI reached 5/21                  Spoke with patient and heart failure questions reviewed.  Transmission results reviewed.  Pt asymptomatic for fluid accumulation.  Pt is being tested for TB on 4/17.   Corvue thoracic impedance suggesting normal fluid levels with the exception of possible fluid accumulation from 5/1-5/8.     Prescribed:  Furosemide 40 mg on take 2 tablets (80 mg total) every morning and 1 tablet (40 mg total) every evening.   Potassium 20 mEq On Monday, Wednesday AND Friday TAKE 1 TAB BY MOUTH 2 TIMES A DAY and on the days you take lasix.   Labs: 05/12/2022 Creatinine 1.30, BUN 15, Potassium 4.2, Sodium 138, GFR 38.83 10/05/2021 Creatinine 1.24, BUN 11, Potassium 3.5, Sodium 140, GFR 41.27 06/01/2021 Creatinine 1.46, BUN 18, Potassium 3.8, Sodium 139, GFR 34.01 A complete set of results can be found in Results Review.   Recommendations:   Encouraged to call if experiencing any fluid symptoms.   Follow-up plan: ICM clinic phone appointment on 08/29/2022.   91 day device clinic remote transmission 09/22/2022.     EP/Cardiology Office Visits: 08/24/2022 with Dr Graciela Husbands.   08/08/2022 with Francis Dowse, PA.   Recall 06/18/2022 with Dr Allyson Sabal.     Copy of ICM check sent to Dr. Graciela Husbands.     3 month ICM trend: 07/27/2022.    12-14 Month ICM trend:     Karie Soda, RN 07/27/2022 2:37 PM

## 2022-07-27 NOTE — Telephone Encounter (Signed)
Remote ICM transmission received.  Attempted call to patient regarding ICM remote transmission and no answer or voice mail option.  

## 2022-07-27 NOTE — Telephone Encounter (Signed)
Spoke with patient informed her her device was at [facility] patient would really prefer to see Dr. Graciela Husbands, apt scheduled for 08/24/22 at 10:15

## 2022-07-27 NOTE — Progress Notes (Signed)
Attempted return call to patient and no answer.   

## 2022-07-28 ENCOUNTER — Ambulatory Visit (HOSPITAL_COMMUNITY)
Admission: RE | Admit: 2022-07-28 | Discharge: 2022-07-28 | Disposition: A | Discharge status: Home / Self Care | Payer: Medicare HMO | Source: Ambulatory Visit | Attending: Pulmonary Disease | Admitting: Pulmonary Disease

## 2022-07-28 DIAGNOSIS — J984 Other disorders of lung: Secondary | ICD-10-CM | POA: Diagnosis not present

## 2022-07-28 DIAGNOSIS — F17200 Nicotine dependence, unspecified, uncomplicated: Secondary | ICD-10-CM | POA: Diagnosis not present

## 2022-07-28 DIAGNOSIS — R918 Other nonspecific abnormal finding of lung field: Secondary | ICD-10-CM | POA: Diagnosis not present

## 2022-07-28 DIAGNOSIS — Z720 Tobacco use: Secondary | ICD-10-CM | POA: Diagnosis not present

## 2022-07-28 DIAGNOSIS — R911 Solitary pulmonary nodule: Secondary | ICD-10-CM | POA: Diagnosis not present

## 2022-07-28 DIAGNOSIS — I2510 Atherosclerotic heart disease of native coronary artery without angina pectoris: Secondary | ICD-10-CM | POA: Insufficient documentation

## 2022-07-28 LAB — GLUCOSE, CAPILLARY: Glucose-Capillary: 88 mg/dL (ref 70–99)

## 2022-07-28 MED ORDER — FLUDEOXYGLUCOSE F - 18 (FDG) INJECTION
7.4100 | Freq: Once | INTRAVENOUS | Status: AC | PRN
  Administered 2022-07-28: 7.41 via INTRAVENOUS

## 2022-07-28 NOTE — Progress Notes (Signed)
Allison Castilla, do you think we should set the patient up for bronchoscopy and biopsy to rule out malignancy?  Robynn Pane, pending Dr. Earlene Plater response we will need an appt to see me or sarah to discuss biopsy/procedure date   Thanks,  BLI  Josephine Igo, DO [facility] 07/28/2022 11:17 AM

## 2022-07-28 NOTE — Progress Notes (Signed)
Remote ICD transmission.   

## 2022-08-02 ENCOUNTER — Encounter: Payer: Self-pay | Admitting: Pulmonary Disease

## 2022-08-02 ENCOUNTER — Ambulatory Visit (INDEPENDENT_AMBULATORY_CARE_PROVIDER_SITE_OTHER): Payer: Medicare HMO | Admitting: Acute Care

## 2022-08-02 ENCOUNTER — Encounter: Payer: Self-pay | Admitting: Acute Care

## 2022-08-02 ENCOUNTER — Telehealth: Payer: Self-pay

## 2022-08-02 DIAGNOSIS — R911 Solitary pulmonary nodule: Secondary | ICD-10-CM

## 2022-08-02 NOTE — Telephone Encounter (Addendum)
Does she currently take her insulin morning or evening? If evening take as 10 units instead of usual dose night before. If morning take 5 units morning of procedure.  Also note from pulmonary she needs to be fasting as of 5 am day of procedure not 5 PM day before.

## 2022-08-02 NOTE — Progress Notes (Signed)
Virtual Visit via Telephone Note  I connected with [name] on 08/02/22 at  8:30 AM EDT by telephone and verified that I am speaking with the correct person using two identifiers.  Location: Patient:  At home Provider: [address] [place], [place], [address]    I discussed the limitations, risks, security and privacy concerns of performing an evaluation and management service by telephone and the availability of in person appointments. I also discussed with the patient that there may be a patient responsible charge related to this service. The patient expressed understanding and agreed to proceed.   History of Present Illness: 82 year old female seen for a left upper lobe cavitary lesion.  Longstanding history of tobacco abuse.  Has lung nodules documented since 2021 on a neck CT.  Follow-up CT imaging showed presence of the disease and February 2022.  Short-term CT follow-up in May 2022 shows persistent nodules.  Was recommended to have a repeat CT scan in March 2023 however she was referred for lung cancer screening but due to her age was not qualified for LCS and no additional follow-up was obtained.   Now she presents with a repeat CT in April 2024 with an enlarging cavitary lesion in the left upper lobe, growth from 1.0 cm - 3.6 cm cavitary lesion.She did have a positive QuantiFERON gold which has made decision making difficult regarding need for tissue sampling. Dr. Tonia Brooms feels this could be 2 separate processes,  an infection and possible malignancy, and he feels tissue sampling should be done to ensure we do not overlook a possible malignancy.   [name] and I discussed this, and she had questions which were answered. He biggest concern was that she is not scheduled for her procedure until 1:15 pm and she was told to have nothing to eat after midnight. As a diabetic, this was going to be very complicated for her. We have called  Dr. Frutoso Chase office , as they manage her insulin,  they confirmed they will call her and instruct her on how to manage her insulin on the day of the procedure. We explained she needs to be NPO 8 hours before procedure, which will be 5 am on 08/09/2022.They verbalized understanding and confirmed they will manage instructions .   Observations/Objective: Alert, appropriate questions , verbalized understanding of need for tissue sampling.  PET Scan 07/28/2022 3.1 cm thick-walled cavitary lesion in the left upper lobe. While this technically may be related to an infectious/inflammatory process, primary bronchogenic neoplasm such as squamous cell carcinoma is considered the diagnosis of exclusion.   Additional scattered nodularity in the left upper lobe, similar to the prior, indeterminate. Additional scattered nodularity, particularly in the right upper lobe and left lower lobe, is new from the prior and favors infection/pneumonia.   Mildly prominent subcarinal node, indeterminate in this setting. Assessment and Plan: 3.1 cm thick-walled cavitary lesion in the left upper lobe suspicious for malignancy Plan Bronchoscopy scheduled for 08/09/2022 Dr. Frutoso Chase office will call patient and instruct her on how to manage her insulin dosing and NPO after 5 am .   Follow Up Instructions: Follow up 08/16/2022 at 11 am with Maralyn Sago NP   I discussed the assessment and treatment plan with the patient. The patient was provided an opportunity to ask questions and all were answered. The patient agreed with the plan and demonstrated an understanding of the instructions.   The patient was advised to call back or seek an in-person evaluation if the symptoms worsen  or if the condition fails to improve as anticipated.  I provided 30 minutes of non-face-to-face time during this encounter.   Bevelyn Ngo, NP

## 2022-08-02 NOTE — Telephone Encounter (Signed)
Patient returned call and states she will be available the rest of the afternoon for a call back.

## 2022-08-02 NOTE — Telephone Encounter (Signed)
Called pt and was unable to leave a voicemail 1st attempt

## 2022-08-02 NOTE — Telephone Encounter (Signed)
I was able to inform the pt of Dr. Frutoso Chase instructions. Pt states she understands and has no questions or concerns at this time.  ** Opt stated she takes 16 units in the evening and I was able to let her know to only take 10 per Crawford's instructions.

## 2022-08-02 NOTE — Patient Instructions (Addendum)
It was good to talk with you today. You bronchoscopy is scheduled for 08/09/2022. You need to be at [facility] at 11 am to prepare you for the procedure Your procedure is at 1:15  Nothing bu mouth after 5 am on 08/09/2022. Dr. Frutoso Chase office will instruct you on your insulin dosing for the day of procedure. Follow up 08/16/2022 at 11 am with Maralyn Sago NP to make sure you are doing well after your procedure.  Please contact office for sooner follow up if symptoms do not improve or worsen or seek emergency care

## 2022-08-02 NOTE — Progress Notes (Deleted)
History of Present Illness [name] is a 82 y.o. female with ***   08/02/2022  Test Results:     Latest Ref Rng & Units 05/12/2022    8:52 AM 10/05/2021   10:01 AM 06/01/2021    9:49 AM  CBC  WBC 4.0 - 10.5 K/uL 7.4  6.6  8.9   Hemoglobin 12.0 - 15.0 g/dL 16.1  09.6  04.5   Hematocrit 36.0 - 46.0 % 40.5  42.0  42.5   Platelets 150.0 - 400.0 K/uL 222.0  201.0  238.0        Latest Ref Rng & Units 05/12/2022    8:52 AM 10/05/2021   10:01 AM 06/01/2021    9:49 AM  BMP  Glucose 70 - 99 mg/dL 409  811  914   BUN 6 - 23 mg/dL 15  11  18    Creatinine 0.40 - 1.20 mg/dL 7.82  9.56  2.13   Sodium 135 - 145 mEq/L 138  140  139   Potassium 3.5 - 5.1 mEq/L 4.2  3.5  3.8   Chloride 96 - 112 mEq/L 98  100  98   CO2 19 - 32 mEq/L 31  32  31   Calcium 8.4 - 10.5 mg/dL 9.9  9.8  08.6     BNP No results found for: "BNP"  ProBNP    Component Value Date/Time   PROBNP 82.0 07/30/2014 1623    PFT No results found for: "FEV1PRE", "FEV1POST", "FVCPRE", "FVCPOST", "TLC", "DLCOUNC", "PREFEV1FVCRT", "PSTFEV1FVCRT"  NM PET Image Initial (PI) Skull Base To Thigh (F-18 FDG)  Result Date: 07/28/2022 CLINICAL DATA:  Initial treatment strategy for cavitary lung mass. EXAM: NUCLEAR MEDICINE PET SKULL BASE TO THIGH TECHNIQUE: 7.4 mCi F-18 FDG was injected intravenously. Full-ring PET imaging was performed from the skull base to thigh after the radiotracer. CT data was obtained and used for attenuation correction and anatomic localization. Fasting blood glucose: 88 mg/dl COMPARISON:  CT chest dated 06/09/2022 FINDINGS: Mediastinal blood pool activity: SUV max 2.1 Liver activity: SUV max NA NECK: No hypermetabolic cervical lymphadenopathy. Incidental CT findings: None. CHEST: 3.1 x 2.6 cm thick-walled cavitary lesion in the left upper lobe, max SUV 6.1. Adjacent scattered left upper lobe nodularity, max SUV 9.2 in the medial left upper lobe. Additional peribronchovascular nodularity in the central right  upper lobe, max SUV 4.5. Additional patchy/peribronchovascular nodularity in the left lower lobe, max SUV 6.0. These findings are new/progressive from recent CT and favor infection/pneumonia. Dominant 10 mm short axis subcarinal node, max SUV 4.3. Incidental CT findings: Atherosclerotic calcifications of the aortic arch. Moderate coronary atherosclerosis of the LAD and right coronary artery. Left subclavian pacemaker. ABDOMEN/PELVIS: No abnormal hypermetabolism in the liver, spleen, pancreas, or adrenal glands. No hypermetabolic abdominopelvic lymphadenopathy. Incidental CT findings: Status post cholecystectomy. Mild left renal atrophy. Atherosclerotic calcifications of the abdominal aorta and branch vessels. SKELETON: No focal hypermetabolic activity to suggest skeletal metastasis. Incidental CT findings: Lumbar spine fixation hardware. Degenerative changes of the visualized thoracolumbar spine. IMPRESSION: 3.1 cm thick-walled cavitary lesion in the left upper lobe. While this technically may be related to an infectious/inflammatory process, primary bronchogenic neoplasm such as squamous cell carcinoma is considered the diagnosis of exclusion. Additional scattered nodularity in the left upper lobe, similar to the prior, indeterminate. Additional scattered nodularity, particularly in the right upper lobe and left lower lobe, is new from the prior and favors infection/pneumonia. Mildly prominent subcarinal node, indeterminate in this setting. Electronically Signed   By:  Charline Bills M.D.   On: 07/28/2022 10:44   CUP PACEART REMOTE DEVICE CHECK  Result Date: 07/21/2022 Scheduled remote reviewed. Normal device function.  The device estimates 3.1 months until ERI. Next remote 07/25/2022. [facility], [facility].  DG Abd 2 Views  Result Date: 07/08/2022 CLINICAL DATA:  Abdominal pain, suspect constipation. Pain of upper abdomen. Patient reports right-sided pain. EXAM: ABDOMEN - 2 VIEW COMPARISON:  Radiograph 10/08/2019  FINDINGS: Supine and upright views of the abdomen obtained. No free intra-abdominal air. No bowel air-fluid levels. No bowel dilatation or evidence of obstruction. Moderate stool in the right colon, small volume of stool distally. No abnormal rectal distention. Cholecystectomy clips in the right upper quadrant. Aortic and branch atherosclerosis. Lumbar scoliosis with postsurgical and degenerative change. Lung bases are clear. Pacemaker wires are intact were visualized. IMPRESSION: 1. Normal bowel gas pattern. 2. Moderate stool in the right colon. Electronically Signed   By: Narda Rutherford M.D.   On: 07/08/2022 10:45     Past medical hx Past Medical History:  Diagnosis Date   Anxiety    Arthritis    Chronic pain    a. Prior h/o chronic pain on methadone.   Chronic systolic CHF (congestive heart failure) (HCC)    a. mixed ischemic/non-ischemic cardiomyopathy. b. s/p CRT-D in 06/2014.   CKD (chronic kidney disease), stage III (HCC)    Complication of anesthesia    hard time waking her up from general surgery   Coronary artery disease    a. remote RCA stenting in 2008 with non-DES. b. Cath 06/2014 following abnormal nuc: Stable, unchanged from prior cath, patent stent and 40% LM.   Diabetes mellitus (HCC)    GERD (gastroesophageal reflux disease)    Headache    Hyperlipidemia    Hypertension    LBBB (left bundle branch block)    Nonischemic cardiomyopathy (HCC)    OSA (obstructive sleep apnea)    AHI-9.77/hr, during REM-50.32/hr   Tobacco abuse      Social History   Tobacco Use   Smoking status: Every Day    Packs/day: 2.00    Years: 58.00    Additional pack years: 0.00    Total pack years: 116.00    Types: Cigarettes    Start date: 12/26/1951   Smokeless tobacco: Never   Tobacco comments:    Smoke 7 packs of cigarettes a week. 06/15/2022 Tay  Vaping Use   Vaping Use: Never used  Substance Use Topics   Alcohol use: No    Alcohol/week: 0.0 standard drinks of alcohol   Drug  use: No    Ms.Arterburn reports that she has been smoking cigarettes. She started smoking about 69 years ago. She has a 116.00 pack-year smoking history. She has never used smokeless tobacco. She reports that she does not drink alcohol and does not use drugs.  Tobacco Cessation: Ready to quit: Not Answered Counseling given: Not Answered Tobacco comments: Smoke 7 packs of cigarettes a week. 06/15/2022 Dellis Filbert   Past surgical hx, Family hx, Social hx all reviewed.  Current Outpatient Medications on File Prior to Visit  Medication Sig   Accu-Chek Softclix Lancets lancets TEST BLOOD SUGAR 1 TO 4 TIMES DAILY AS DIRECTED.   albuterol (VENTOLIN HFA) 108 (90 Base) MCG/ACT inhaler Inhale 2 puffs into the lungs every 6 (six) hours as needed for wheezing or shortness of breath.   Alcohol Swabs (DROPSAFE ALCOHOL PREP) 70 % PADS Apply topically.   aspirin EC 81 MG tablet Take 81 mg by mouth  at bedtime.   Blood Glucose Monitoring Suppl (ACCU-CHEK GUIDE) w/Device KIT USE AS DIRECTED   carvedilol (COREG) 12.5 MG tablet TAKE 1 AND 1/2 TABLETS TWICE DAILY   clopidogrel (PLAVIX) 75 MG tablet TAKE 1 TABLET EVERY DAY   diclofenac Sodium (VOLTAREN) 1 % GEL    furosemide (LASIX) 40 MG tablet TAKE 2 TABLETS ONE TIME DAILY IN THE MORNING AND TAKE 1 TABLET EVERY EVENING (MUST KEEP APPT FOR FUTURE REFILLS)   Garlic 500 MG CAPS Take 500 mg by mouth daily.   glucose blood (ACCU-CHEK GUIDE) test strip TEST BLOOD SUGAR 1 TO 4 TIMES DAILY AS DIRECTED   insulin degludec (TRESIBA FLEXTOUCH) 100 UNIT/ML FlexTouch Pen Inject 16 Units into the skin daily.   Insulin Pen Needle (BD PEN NEEDLE MICRO U/F) 32G X 6 MM MISC USE NEW NEEDLE FOR EACH INJECTION OF INSULIN, FOUR TIMES DAILY   ipratropium (ATROVENT) 0.03 % nasal spray Place 2 sprays into both nostrils 2 (two) times daily.   isosorbide mononitrate (IMDUR) 30 MG 24 hr tablet    levothyroxine (SYNTHROID) 100 MCG tablet Take 1 tablet (100 mcg total) by mouth daily before breakfast.    mirtazapine (REMERON) 15 MG tablet Take 1 tablet (15 mg total) by mouth at bedtime.   nitroGLYCERIN (NITROSTAT) 0.4 MG SL tablet Place 1 tablet (0.4 mg total) under the tongue every 5 (five) minutes as needed for chest pain.   oxyCODONE-acetaminophen (PERCOCET/ROXICET) 5-325 MG tablet Take 1 tablet by mouth 2 (two) times daily as needed for severe pain.   potassium chloride SA (KLOR-CON M) 20 MEQ tablet TAKE 1 TABLET TWICE DAILY ON MONDAY, WEDNESDAY AND FRIDAY (DAYS YOU TAKE LASIX/FUROSEMIDE)   ramelteon (ROZEREM) 8 MG tablet Take 1 tablet (8 mg total) by mouth at bedtime.   sertraline (ZOLOFT) 25 MG tablet Take 1 tablet (25 mg total) by mouth daily. (Patient not taking: Reported on 07/05/2022)   simvastatin (ZOCOR) 20 MG tablet Take 1 tablet (20 mg total) by mouth daily with supper.   triamcinolone cream (KENALOG) 0.1 % APPLY ONE APPLICATION TOPICALLY TWO TIMES DAILY   Vitamin D, Ergocalciferol, (DRISDOL) 1.25 MG (50000 UNIT) CAPS capsule    No current facility-administered medications on file prior to visit.     Allergies  Allergen Reactions   Valium [Diazepam] Swelling    face    Review Of Systems:  Constitutional:   No  weight loss, night sweats,  Fevers, chills, fatigue, or  lassitude.  HEENT:   No headaches,  Difficulty swallowing,  Tooth/dental problems, or  Sore throat,                No sneezing, itching, ear ache, nasal congestion, post nasal drip,   CV:  No chest pain,  Orthopnea, PND, swelling in lower extremities, anasarca, dizziness, palpitations, syncope.   GI  No heartburn, indigestion, abdominal pain, nausea, vomiting, diarrhea, change in bowel habits, loss of appetite, bloody stools.   Resp: No shortness of breath with exertion or at rest.  No excess mucus, no productive cough,  No non-productive cough,  No coughing up of blood.  No change in color of mucus.  No wheezing.  No chest wall deformity  Skin: no rash or lesions.  GU: no dysuria, change in color of  urine, no urgency or frequency.  No flank pain, no hematuria   MS:  No joint pain or swelling.  No decreased range of motion.  No back pain.  Psych:  No change in mood or affect.  No depression or anxiety.  No memory loss.   Vital Signs There were no vitals taken for this visit.   Physical Exam:  General- No distress,  A&Ox3 ENT: No sinus tenderness, TM clear, pale nasal mucosa, no oral exudate,no post nasal drip, no LAN Cardiac: S1, S2, regular rate and rhythm, no murmur Chest: No wheeze/ rales/ dullness; no accessory muscle use, no nasal flaring, no sternal retractions Abd.: Soft Non-tender Ext: No clubbing cyanosis, edema Neuro:  normal strength Skin: No rashes, warm and dry Psych: normal mood and behavior   Assessment/Plan  No problem-specific Assessment & Plan notes found for this encounter.    Bevelyn Ngo, NP 08/02/2022  8:38 AM

## 2022-08-04 NOTE — Progress Notes (Signed)
Remote ICD transmission.   

## 2022-08-05 ENCOUNTER — Other Ambulatory Visit: Payer: Medicare HMO

## 2022-08-05 ENCOUNTER — Encounter (HOSPITAL_COMMUNITY): Payer: Self-pay | Admitting: Pulmonary Disease

## 2022-08-05 ENCOUNTER — Other Ambulatory Visit: Payer: Self-pay

## 2022-08-05 DIAGNOSIS — J984 Other disorders of lung: Secondary | ICD-10-CM | POA: Diagnosis not present

## 2022-08-05 NOTE — Progress Notes (Signed)
Spoke with pt for pre-op call. Pt has ICD due to ischemic cardiomopathy. Pt denies any recent chest pain. She states she sees Dr. Graciela Husbands and Dr. Allyson Sabal. [facility]   Pt is diabetic. Last A1C was 7.1 on 05/12/22. Pt states she does not check her blood sugar on routine basis.  Instructed pt to take 1/2 of her regular dose of Tresiba Insulin Monday evening. Pt was concerned about her surgery being in the afternoon and not able to eat or drink after midnight. She had voiced that to Kandice Robinsons, NP with Pulmonology and she had in her note on 08/02/22 that pt could be NPO 8 hours prior to procedure. So she can drink up until 5:45 AM. Pt was appreciative of that. Instructed pt to check her blood sugar when she wakes up Tuesday AM and every 2 hours until she leaves for the hospital.  If blood sugar is 70 or below, treat with 1/2 cup of clear juice (apple or cranberry) and recheck blood sugar 15 minutes after drinking juice. If blood sugar continues to be 70 or below, call the Short Stay department and ask to speak to a nurse.  Shower instructions given to pt.   Covid test done today 08/05/22.

## 2022-08-06 LAB — SPECIMEN STATUS REPORT

## 2022-08-06 LAB — NOVEL CORONAVIRUS, NAA: SARS-CoV-2, NAA: NOT DETECTED

## 2022-08-08 ENCOUNTER — Encounter: Payer: Medicare HMO | Admitting: Physician Assistant

## 2022-08-08 NOTE — Progress Notes (Signed)
Anesthesia Chart Review: [name]  Case: [id number] Date/Time: 08/09/22 1345   Procedure: ROBOTIC ASSISTED NAVIGATIONAL BRONCHOSCOPY (Bilateral)   Anesthesia type: General   Diagnosis: Cavitary lesion of lung [J98.4]   Pre-op diagnosis: Cavitary lung lesion   [facility]: [facility] / [facility]   Surgeons: Josephine Igo, DO       DISCUSSION: Patient is an 82 year old female scheduled for the above procedure. She is a smoker with a LUL cavitary lung lesion. Repeat CT in April 2024 showed enlargement. Surgery was initially scheduled in April, but was postponed after her 06/15/22 Juanetta Gosling test came back positive. Per 08/02/22 Progress Note by Kandice Robinsons, NP, "repeat CT in April 2024 with an enlarging cavitary lesion in the left upper lobe, growth from 1.0 cm - 3.6 cm cavitary lesion.She did have a positive QuantiFERON gold which has made decision making difficult regarding need for tissue sampling. Dr. Tonia Brooms feels this could be 2 separate processes,  an infection and possible malignancy, and he feels tissue sampling should be done to ensure we do not overlook a possible malignancy." On 08/08/22, RN Carollee Herter with ENDO reached out to Dr. Tonia Brooms to clarify precautions, she communicated, "per dr icard her TB pcr was negative from the health department, so she doesn't have to be the last case"   History includes smoking, CAD (s/p pRCA stent 12/14/06; 05/29/14 40% dLM, patent RCA stent < 40% ISR, EF 20-25%, medical therapy and referred to EP), chronic systolic CHF, cardiomyopathy (likely non-ischemic, EF 20-25% 05/2014, 50-55% 10/2015), AICD ([employer] 3365-40C Quadra Assura ICD-CRT implant 06/11/14), left BBB, HTN, HLD, DM2, CKD (stage 3), GERD, anemia, hypothyroidism, OSA, right vocal cord leukoplakia (s/p right vocal cord stripping (09/15/99, 07/12/00, 11/18/02, 02/17/05, 12/18/06 for leukoplakia, retention cyst), chronic pain, spinal surgery (C3-5 ACDF 02/06/01; L3-4 laminotomy, foraminotomy  04/15/10; L3-4 PLIF 12/02/10). Reported prolonged emergence from anesthesia.   Last visit with EP Dr. Graciela Husbands was televisit 03/28/22. Orthostasis stable. Euvolemic. Device battery approaching elective replacement. He reviewed vibration alert. Follow-up in 4-5 months planned--scheduled to for follow-up 08/24/22. Note suggests he was reaching out to Dr. Allyson Sabal to see if she still needed DAPT. (Plavix has since come off her medication list.). Last visit with Dr. Allyson Sabal was on 06/23/21. Tolerating b-blocker, but not on ACEi/ARB due to hypotension. Mild ICA stenosis, last Duplex 04/2022. 07/25/22 Corvue thoracic impedance suggested normal fluid levels except possible accumulation from 07/06/22-07/13/22. Continue Lasix, KCl.  Per 07/21/22 remote ICD interrogation. Normal device function. Remaining longevity 3 months. EP follow-up scheduled for 08/24/22.   She is followed by ID Dr. Earlene Plater for cavitary lung lesion with positive Quantiferon Gold test on 06/15/22. 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  We also talked about the difficulty treating NTM with multiple antibiotics over 12-18 months with risk of toxicity, difficulty eradicating infection, and high risk of recurrence even with successful treatment.  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 I will see her back again in 4 weeks to discuss treatment options once more results are back from the health department." RN Carollee Herter with ENDO reached out to Dr. Tonia Brooms regarding any updates that might  guide decision regarding precautions during hospital stay. She  communicated, "per dr icard her TB pcr was negative from the health department, so she doesn't have to be the last case".   Pulmonology APP advised NPO for 8 hours prior to the procedure, with insulin adjustments per PCP Dr. Okey Dupre.    08/05/22 COVID-19 test negative. Discussed with anesthesiologist Willette Alma, MD. Recommend preoperative cardiology input. Dr. Tonia Brooms reached out to Dr. Allyson Sabal personally who wrote, "I have no problems with Ms. Lemaster undergoing bronchoscopy." As above, Plavix is no longer on her medication list. She is on ASA 81 mg. Unclear if she was told to hold. I attempted to call patient on 08/08/22 but phone just kept ringing. Dr. Tonia Brooms aware, he felt ASA was okay. RN Darl Pikes, requested ICD perioperatively recommendations from EP.   I contacted Graciella Freer, PA-C with EP regarding perioperative ICD management recommendations given her ICD is at [facility] (3 month battery as of May 2024). Since possibly infectious etiology is also in the differential then would not likely change her generator until ruled out or more details known. He discussed ICD perioperative management with [employer] engineer.  Reprogramming device before/after procedure would place device at more risk for battery depletion than using a magnet alone, so if cautery is being used then advise magnet application instead during the case without reprogramming.   If cautery is not anticipated then hopefully magnet can just be placed on standby. We will hopefully have more details about the etiology of her cavity lung lesion by the time she sees Dr. Graciela Husbands on 08/24/22.   I updated Dr. Armond Hang. Anesthesia team to evaluate on the day of surgery.     VS:  BP Readings from Last 3 Encounters:  07/07/22 115/80  07/05/22 116/60  06/15/22 120/60   Pulse Readings from Last 3 Encounters:  07/07/22 74  07/05/22 75  06/15/22 94     PROVIDERS: Myrlene Broker, MD is PCP  Nanetta Batty, MD is  primary cardiologist Sherryl Manges, MD is EP cardiologist Gwynn Burly, DO is ID   LABS: Most recent lab results in [facility] include: Lab Results  Component Value Date   WBC 7.4 05/12/2022   HGB 13.8 05/12/2022   HCT 40.5 05/12/2022   PLT 222.0 05/12/2022   GLUCOSE 112 (H) 05/12/2022   CHOL 141 10/05/2021   TRIG 179.0 (H) 10/05/2021   HDL 52.20 10/05/2021   LDLCALC 53 10/05/2021   ALT 11 05/12/2022   AST 17 05/12/2022   NA 138 05/12/2022   K 4.2 05/12/2022   CL 98 05/12/2022   CREATININE 1.30 (H) 05/12/2022   BUN 15 05/12/2022   CO2 31 05/12/2022   TSH 17.64 (H) 05/12/2022   HGBA1C 7.1 (H) 05/12/2022   MICROALBUR <0.7 10/05/2021     IMAGES: PET Scan 07/28/22: IMPRESSION: - 3.1 cm thick-walled cavitary lesion in the left upper lobe. While this technically may be related to an infectious/inflammatory process, primary bronchogenic neoplasm such as squamous cell carcinoma is considered the diagnosis of exclusion. - Additional scattered nodularity in the left upper lobe, similar to the prior, indeterminate. Additional scattered nodularity, particularly in the right upper lobe and left lower lobe, is new from the prior and favors infection/pneumonia. - Mildly prominent subcarinal node, indeterminate in this setting.   CT Chest 06/09/22: IMPRESSION: 1. Significant interval progression of left upper lobe pulmonary nodularity compared to prior imaging from May of 2022. The previously identified small 1 cm pulmonary nodule has enlarged to  a maximum of 3.6 cm and demonstrates central cavitation. There are surrounding ground-glass inflammatory changes, bronchiectasis and satellite nodules. Several small bronchi lead directly into the cavitary mass. Differential considerations include squamous cell bronchogenic carcinoma, progressive atypical mycobacterial infection (MAI), fungal infection, and in the appropriate clinical setting, tuberculosis. Recommend referral to pulmonology  for endobronchial evaluation and sampling. 2. Multiple additional small scattered pulmonary nodules some of which were previously present, and some of which are new. Overall, these findings are most suggestive of a chronic indolent atypical infection such as MAI. 3. Extensive small centrilobular pulmonary nodules and ground-glass attenuation airspace opacities in a peribronchovascular distribution throughout the left lower lobe suggests an active infectious/inflammatory process such as bronchopneumonia. 4. Biventricular cardiac rhythm maintenance device. 5. Aortic and coronary artery atherosclerotic vascular calcifications.   EKG: For day of surgery as indicated. Last EKG in [facility] > 82 year old: 06/01/21: Electronic ventricular pacemaker  -possibly demand type  Pacemaker ECG, No further analysis    CV: US Carotid 04/25/22: Summary:  - Right Carotid: Velocities in the right ICA are consistent with a 1-39% stenosis. The ECA appears >50% stenosed.  - Left Carotid: Velocities in the left ICA are consistent with a 1-39% stenosis.  - Vertebrals: Normal antegrade flow in the left vertebral artery. Small  caliber right vertebral artery with high resistant flow.  - Subclavians: Normal flow hemodynamics were seen in bilateral subclavian arteries.    Echo (Limited) 10/22/15: Study Conclusions  - Left ventricle: Systolic function was normal. The estimated    ejection fraction was in the range of 50% to 55%. Doppler    parameters are consistent with abnormal left ventricular    relaxation (grade 1 diastolic dysfunction).  - Impressions: AV optimazation with no clear change in mitral    inflow as they all showed abnormal relaxation with prominant    atrial contribution to filling    and no CO calculated at varying AV intervals  Impressions:  - AV optimazation with no clear change in mitral inflow as they all    showed abnormal relaxation with prominant atrial contribution to    filling and no  CO calculated at varying AV intervals  - Comparison EF 30-35% 10/13/14; 20-25% 05/20/14; 45-50% 08/21/09   Cardiac cath 05/29/14: HEMODYNAMICS:  AO SYSTOLIC/AO DIASTOLIC: 114/62 LV SYSTOLIC/LV DIASTOLIC: 121/12 Right atrial pressure: 6/4 Right ventricle pressure: Systolic 23 diastolic pressure 6 Pulmonary artery pressure: Systolic 27, diastolic 11, mean 18  Pulmonary Wedge pressure: A wave 10 V-wave 9 mean 9 Cardiac output  by thermodilution was 3.46 L/m with an index of 1.76 L/m/m, cardiac output by Fick was 4.57 L/m with an index of 2.32 L/m/m   ANGIOGRAPHIC RESULTS:  1. Left main; 40% smooth distal  2. LAD;  Free of significant disease 3. Left circumflex; nondominant and free of significant disease.  4. Right coronary artery; dominant with a patent stent and a most 40% in-stent restenosis 5. Left ventriculography; RAO left ventriculogram was performed using  25 mL of Visipaque dye at 12 mL/second. The overall LVEF estimated  20-25 %  With wall motion abnormalities an severe global hypokinesia   IMPRESSION:[name] has unchanged coronary anatomy with 40% distal left main and a patent RCA stent with severe LV dysfunction and fairly normal filling pressures with diminished cardiac output. She has a left bundle branch block with a QRS duration of 150 ms. She may benefit from a BiV- ICD for resynchronization  Therapy. I will refer her to the EP service for  evaluation and consideration of this.     Echo (Complete) 05/20/14: Study Conclusions  - Left ventricle: Severe septal , apical and inferior hypokinesis.    The cavity size was severely dilated. Wall thickness was normal.    Systolic function was severely reduced. The estimated ejection    fraction was in the range of 20% to 25%.  - Aortic valve: There was trivial regurgitation.  - Mitral valve: There was moderate regurgitation.  - Left atrium: The atrium was mildly dilated.  - Atrial septum: No defect or patent foramen ovale was  identified.    Past Medical History:  Diagnosis Date   AICD (automatic cardioverter/defibrillator) present    Anemia    in the past   Anxiety    Arthritis    Chronic pain    a. Prior h/o chronic pain on methadone.   Chronic systolic CHF (congestive heart failure) (HCC)    a. mixed ischemic/non-ischemic cardiomyopathy. b. s/p CRT-D in 06/2014.   CKD (chronic kidney disease), stage III (HCC)    Complication of anesthesia    hard time waking her up from general surgery   Coronary artery disease    a. remote RCA stenting in 2008 with non-DES. b. Cath 06/2014 following abnormal nuc: Stable, unchanged from prior cath, patent stent and 40% LM.   Depression    Diabetes mellitus (HCC)    GERD (gastroesophageal reflux disease)    Hyperlipidemia    Hypertension    Hypothyroidism    LBBB (left bundle branch block)    Nonischemic cardiomyopathy (HCC)    OSA (obstructive sleep apnea)    AHI-9.77/hr, during REM-50.32/hr  - no cpap at this time   Pneumonia    January 2024   Tobacco abuse     Past Surgical History:  Procedure Laterality Date   BACK SURGERY  2013   BI-VENTRICULAR IMPLANTABLE CARDIOVERTER DEFIBRILLATOR N/A 06/11/2014   STJ CRTD implanted by Dr Graciela Husbands   CARDIAC CATHETERIZATION  12/14/2006   RCA stented with a 3.0 [employer] Liberte stent resulting in a reduction of 75% to 0% residual   CHOLECYSTECTOMY     20 years ago   COLONOSCOPY WITH PROPOFOL N/A 12/15/2014   Procedure: COLONOSCOPY WITH PROPOFOL;  Surgeon: Vida Rigger, MD;  Location: [facility];  Service: Endoscopy;  Laterality: N/A;   EYE SURGERY     bilateral cataract surgery    LEFT AND RIGHT HEART CATHETERIZATION WITH CORONARY ANGIOGRAM N/A 05/29/2014   Procedure: LEFT AND RIGHT HEART CATHETERIZATION WITH CORONARY ANGIOGRAM;  Surgeon: Runell Gess, MD;  Location: [facility] CATH LAB;  Service: Cardiovascular;  Laterality: N/A;   LEFT HEART CATHETERIZATION WITH CORONARY ANGIOGRAM N/A 12/27/2012   Procedure: LEFT  HEART CATHETERIZATION WITH CORONARY ANGIOGRAM;  Surgeon: Runell Gess, MD;  Location: [facility] CATH LAB;  Service: Cardiovascular;  Laterality: N/A;    MEDICATIONS: No current facility-administered medications for this encounter.    aspirin EC 81 MG tablet   carvedilol (COREG) 12.5 MG tablet   cholecalciferol (VITAMIN D3) 25 MCG (1000 UNIT) tablet   diclofenac Sodium (VOLTAREN) 1 % GEL   furosemide (LASIX) 40 MG tablet   Garlic 500 MG CAPS   insulin degludec (TRESIBA FLEXTOUCH) 100 UNIT/ML FlexTouch Pen   ipratropium (ATROVENT) 0.03 % nasal spray   levothyroxine (SYNTHROID) 100 MCG tablet   nitroGLYCERIN (NITROSTAT) 0.4 MG SL tablet   oxyCODONE-acetaminophen (PERCOCET/ROXICET) 5-325 MG tablet   potassium chloride SA (KLOR-CON M) 20 MEQ tablet   Propylene Glycol (SYSTANE COMPLETE) 0.6 %  SOLN   simvastatin (ZOCOR) 20 MG tablet   Accu-Chek Softclix Lancets lancets   Alcohol Swabs (DROPSAFE ALCOHOL PREP) 70 % PADS   Blood Glucose Monitoring Suppl (ACCU-CHEK GUIDE) w/Device KIT   glucose blood (ACCU-CHEK GUIDE) test strip   Insulin Pen Needle (BD PEN NEEDLE MICRO U/F) 32G X 6 MM MISC   mirtazapine (REMERON) 15 MG tablet   ramelteon (ROZEREM) 8 MG tablet   sertraline (ZOLOFT) 25 MG tablet   triamcinolone cream (KENALOG) 0.1 %    Shonna Chock, PA-C Surgical Short Stay/Anesthesiology [facility] Phone [phone] [facility] Phone [phone] 08/08/2022 4:37 PM

## 2022-08-08 NOTE — Progress Notes (Signed)
Sent message to device clinic.

## 2022-08-08 NOTE — Progress Notes (Signed)
  PERIOPERATIVE PRESCRIPTION FOR IMPLANTED CARDIAC DEVICE PROGRAMMING   Patient Information: Name:  [name]  DOB:  [birth year] MRN: [id number]      Request for Surgical Clearance     Procedure:   Robotic assisted navigational bronchoscopy    Date of Surgery:  08/09/2022    Surgeon:  Dr. Tonia Brooms Surgeon's Group or Practice Name:  [facility]  Device Information:   Clinic EP Physician:  Dr. Graciela Husbands    Device Type:  CRT-D Manufacturer and Phone #:  [employer]: [phone] Pacemaker Dependent?:  Unknown Date of Last Device Check:  07/21/2022 (Remote)  Normal Device Function?:  Yes, for device at [facility] as of 5/21   Electrophysiologist's Recommendations:   Procedure is likely to interfere with device function.  Due to recent ERI status, magnet application for the duration of the procedure is recommended, WITHOUT reprogramming.  Provide continuous ECG monitoring when magnet is used or reprogramming is to be performed.

## 2022-08-08 NOTE — Anesthesia Preprocedure Evaluation (Signed)
Anesthesia Evaluation  Patient identified by MRN, date of birth, ID band Patient awake    Reviewed: Allergy & Precautions, H&P , NPO status , Patient's Chart, lab work & pertinent test results  Airway Mallampati: II  TM Distance: >3 FB Neck ROM: Full    Dental  (+) Poor Dentition, Edentulous Upper, Missing   Pulmonary sleep apnea , COPD, Current Smoker and Patient abstained from smoking.   Pulmonary exam normal breath sounds clear to auscultation       Cardiovascular hypertension, Pt. on medications + CAD, + Peripheral Vascular Disease and +CHF  Normal cardiovascular exam+ dysrhythmias Atrial Fibrillation + Cardiac Defibrillator  Rhythm:Regular Rate:Normal     Neuro/Psych   Anxiety Depression    negative neurological ROS  negative psych ROS   GI/Hepatic Neg liver ROS,GERD  ,,  Endo/Other  diabetesHypothyroidism    Renal/GU negative Renal ROS  negative genitourinary   Musculoskeletal  (+) Arthritis , Osteoarthritis,    Abdominal   Peds negative pediatric ROS (+)  Hematology  (+) Blood dyscrasia, anemia   Anesthesia Other Findings   Reproductive/Obstetrics negative OB ROS                             Anesthesia Physical Anesthesia Plan  ASA: 3  Anesthesia Plan: General   Post-op Pain Management: Minimal or no pain anticipated   Induction: Intravenous  PONV Risk Score and Plan: 2 and Ondansetron, Midazolam and Treatment may vary due to age or medical condition  Airway Management Planned: Oral ETT  Additional Equipment:   Intra-op Plan:   Post-operative Plan: Extubation in OR  Informed Consent: I have reviewed the patients History and Physical, chart, labs and discussed the procedure including the risks, benefits and alternatives for the proposed anesthesia with the patient or authorized representative who has indicated his/her understanding and acceptance.     Dental  advisory given  Plan Discussed with: CRNA  Anesthesia Plan Comments: (See PAT note written 08/08/2022 by Shonna Chock, PA-C.  She saw ID Dr. Earlene Plater for cavitary lung lesion with positive Quantiferon Gold test on 06/15/22. 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 RN Carollee Herter with ENDO reached out to Dr. Tonia Brooms on 08/08/22 regarding any updates that might guide decision regarding precautions during hospital stay. She communicated, "per dr icard her TB pcr was negative from the health department, so she doesn't have to be the last case".    Patient has DM2. Pulmonology APP advised NPO for 8 hours prior to the procedure, with insulin adjustments per PCP Dr. Okey Dupre.    On 08/08/22, cardiologist Dr. Nanetta Batty communicated, "I have no problems with Ms. Stukey undergoing bronchoscopy."   Her ICD is at [facility] (3 months battery life as of May 2024). I contacted Graciella Freer, PA-C with EP regarding perioperative ICD management recommendations. Since infection and/or malignancy are on the differential then would not likely change her generator until infectious etiology ruled out or more details known. He discussed ICD perioperative management with [employer] engineer.  Reprogramming device before/after procedure would place device at more risk for battery depletion than using a magnet alone, so if cautery is being used then advise magnet application instead during the case without reprogramming. She has EP follow-up with Dr. Graciela Husbands on 08/24/22 to discuss next steps for ERI.      )        Anesthesia Quick Evaluation

## 2022-08-09 ENCOUNTER — Ambulatory Visit (HOSPITAL_COMMUNITY): Payer: Medicare HMO

## 2022-08-09 ENCOUNTER — Other Ambulatory Visit: Payer: Self-pay

## 2022-08-09 ENCOUNTER — Ambulatory Visit (HOSPITAL_BASED_OUTPATIENT_CLINIC_OR_DEPARTMENT_OTHER): Payer: Medicare HMO | Admitting: Vascular Surgery

## 2022-08-09 ENCOUNTER — Ambulatory Visit (HOSPITAL_COMMUNITY): Payer: Medicare HMO | Admitting: Vascular Surgery

## 2022-08-09 ENCOUNTER — Ambulatory Visit (HOSPITAL_COMMUNITY)
Admission: RE | Admit: 2022-08-09 | Discharge: 2022-08-09 | Disposition: A | Discharge status: Home / Self Care | Payer: Medicare HMO | Attending: Pulmonary Disease | Admitting: Pulmonary Disease

## 2022-08-09 ENCOUNTER — Ambulatory Visit: Payer: Medicare HMO | Admitting: Internal Medicine

## 2022-08-09 ENCOUNTER — Encounter (HOSPITAL_COMMUNITY)
Admission: RE | Disposition: A | Discharge status: Home / Self Care | Payer: Self-pay | Source: Home / Self Care | Attending: Pulmonary Disease

## 2022-08-09 ENCOUNTER — Encounter (HOSPITAL_COMMUNITY): Payer: Self-pay | Admitting: Pulmonary Disease

## 2022-08-09 DIAGNOSIS — R918 Other nonspecific abnormal finding of lung field: Secondary | ICD-10-CM | POA: Diagnosis not present

## 2022-08-09 DIAGNOSIS — I5022 Chronic systolic (congestive) heart failure: Secondary | ICD-10-CM | POA: Insufficient documentation

## 2022-08-09 DIAGNOSIS — E039 Hypothyroidism, unspecified: Secondary | ICD-10-CM | POA: Diagnosis not present

## 2022-08-09 DIAGNOSIS — Z794 Long term (current) use of insulin: Secondary | ICD-10-CM | POA: Insufficient documentation

## 2022-08-09 DIAGNOSIS — F32A Depression, unspecified: Secondary | ICD-10-CM | POA: Diagnosis not present

## 2022-08-09 DIAGNOSIS — I110 Hypertensive heart disease with heart failure: Secondary | ICD-10-CM

## 2022-08-09 DIAGNOSIS — D649 Anemia, unspecified: Secondary | ICD-10-CM | POA: Diagnosis not present

## 2022-08-09 DIAGNOSIS — J449 Chronic obstructive pulmonary disease, unspecified: Secondary | ICD-10-CM

## 2022-08-09 DIAGNOSIS — G4730 Sleep apnea, unspecified: Secondary | ICD-10-CM | POA: Diagnosis not present

## 2022-08-09 DIAGNOSIS — I509 Heart failure, unspecified: Secondary | ICD-10-CM

## 2022-08-09 DIAGNOSIS — Z720 Tobacco use: Secondary | ICD-10-CM | POA: Diagnosis present

## 2022-08-09 DIAGNOSIS — E785 Hyperlipidemia, unspecified: Secondary | ICD-10-CM | POA: Insufficient documentation

## 2022-08-09 DIAGNOSIS — E119 Type 2 diabetes mellitus without complications: Secondary | ICD-10-CM | POA: Diagnosis not present

## 2022-08-09 DIAGNOSIS — Z95810 Presence of automatic (implantable) cardiac defibrillator: Secondary | ICD-10-CM | POA: Diagnosis not present

## 2022-08-09 DIAGNOSIS — G8929 Other chronic pain: Secondary | ICD-10-CM | POA: Diagnosis not present

## 2022-08-09 DIAGNOSIS — I2510 Atherosclerotic heart disease of native coronary artery without angina pectoris: Secondary | ICD-10-CM | POA: Insufficient documentation

## 2022-08-09 DIAGNOSIS — I130 Hypertensive heart and chronic kidney disease with heart failure and stage 1 through stage 4 chronic kidney disease, or unspecified chronic kidney disease: Secondary | ICD-10-CM | POA: Diagnosis not present

## 2022-08-09 DIAGNOSIS — I4891 Unspecified atrial fibrillation: Secondary | ICD-10-CM | POA: Diagnosis not present

## 2022-08-09 DIAGNOSIS — E1122 Type 2 diabetes mellitus with diabetic chronic kidney disease: Secondary | ICD-10-CM | POA: Insufficient documentation

## 2022-08-09 DIAGNOSIS — G4733 Obstructive sleep apnea (adult) (pediatric): Secondary | ICD-10-CM | POA: Insufficient documentation

## 2022-08-09 DIAGNOSIS — J181 Lobar pneumonia, unspecified organism: Secondary | ICD-10-CM | POA: Diagnosis not present

## 2022-08-09 DIAGNOSIS — F17210 Nicotine dependence, cigarettes, uncomplicated: Secondary | ICD-10-CM

## 2022-08-09 DIAGNOSIS — J984 Other disorders of lung: Secondary | ICD-10-CM | POA: Diagnosis not present

## 2022-08-09 DIAGNOSIS — F419 Anxiety disorder, unspecified: Secondary | ICD-10-CM | POA: Diagnosis not present

## 2022-08-09 DIAGNOSIS — E1151 Type 2 diabetes mellitus with diabetic peripheral angiopathy without gangrene: Secondary | ICD-10-CM | POA: Insufficient documentation

## 2022-08-09 DIAGNOSIS — F17200 Nicotine dependence, unspecified, uncomplicated: Secondary | ICD-10-CM | POA: Diagnosis not present

## 2022-08-09 DIAGNOSIS — R911 Solitary pulmonary nodule: Secondary | ICD-10-CM | POA: Diagnosis not present

## 2022-08-09 DIAGNOSIS — D759 Disease of blood and blood-forming organs, unspecified: Secondary | ICD-10-CM | POA: Insufficient documentation

## 2022-08-09 DIAGNOSIS — N183 Chronic kidney disease, stage 3 unspecified: Secondary | ICD-10-CM | POA: Insufficient documentation

## 2022-08-09 HISTORY — PX: BRONCHIAL BIOPSY: SHX5109

## 2022-08-09 HISTORY — PX: VIDEO BRONCHOSCOPY WITH ENDOBRONCHIAL ULTRASOUND: SHX6177

## 2022-08-09 HISTORY — DX: Pneumonia, unspecified organism: J18.9

## 2022-08-09 HISTORY — DX: Depression, unspecified: F32.A

## 2022-08-09 HISTORY — PX: BRONCHIAL BRUSHINGS: SHX5108

## 2022-08-09 HISTORY — PX: FINE NEEDLE ASPIRATION: SHX5430

## 2022-08-09 HISTORY — PX: BRONCHIAL NEEDLE ASPIRATION BIOPSY: SHX5106

## 2022-08-09 HISTORY — DX: Hypothyroidism, unspecified: E03.9

## 2022-08-09 LAB — POCT I-STAT, CHEM 8
BUN: 19 mg/dL (ref 8–23)
Calcium, Ion: 0.71 mmol/L — CL (ref 1.15–1.40)
Chloride: 108 mmol/L (ref 98–111)
Creatinine, Ser: 1.3 mg/dL — ABNORMAL HIGH (ref 0.44–1.00)
Glucose, Bld: 140 mg/dL — ABNORMAL HIGH (ref 70–99)
HCT: 34 % — ABNORMAL LOW (ref 36.0–46.0)
Hemoglobin: 11.6 g/dL — ABNORMAL LOW (ref 12.0–15.0)
Potassium: 4.1 mmol/L (ref 3.5–5.1)
Sodium: 133 mmol/L — ABNORMAL LOW (ref 135–145)
TCO2: 25 mmol/L (ref 22–32)

## 2022-08-09 LAB — GLUCOSE, CAPILLARY: Glucose-Capillary: 152 mg/dL — ABNORMAL HIGH (ref 70–99)

## 2022-08-09 SURGERY — BRONCHOSCOPY, WITH BIOPSY USING ELECTROMAGNETIC NAVIGATION
Anesthesia: General | Laterality: Bilateral

## 2022-08-09 MED ORDER — SUCCINYLCHOLINE CHLORIDE 200 MG/10ML IV SOSY
PREFILLED_SYRINGE | INTRAVENOUS | Status: DC | PRN
  Administered 2022-08-09: 100 mg via INTRAVENOUS

## 2022-08-09 MED ORDER — INSULIN ASPART 100 UNIT/ML IJ SOLN: 0.0000 [IU] | INTRAMUSCULAR | Status: DC | PRN

## 2022-08-09 MED ORDER — LACTATED RINGERS IV SOLN: INTRAVENOUS | Status: DC

## 2022-08-09 MED ORDER — PROPOFOL 10 MG/ML IV BOLUS
INTRAVENOUS | Status: DC | PRN
  Administered 2022-08-09: 120 mg via INTRAVENOUS

## 2022-08-09 MED ORDER — ROCURONIUM BROMIDE 10 MG/ML (PF) SYRINGE
PREFILLED_SYRINGE | INTRAVENOUS | Status: DC | PRN
  Administered 2022-08-09: 30 mg via INTRAVENOUS

## 2022-08-09 MED ORDER — DEXAMETHASONE SODIUM PHOSPHATE 10 MG/ML IJ SOLN
INTRAMUSCULAR | Status: DC | PRN
  Administered 2022-08-09: 10 mg via INTRAVENOUS

## 2022-08-09 MED ORDER — LIDOCAINE 2% (20 MG/ML) 5 ML SYRINGE
INTRAMUSCULAR | Status: DC | PRN
  Administered 2022-08-09: 60 mg via INTRAVENOUS

## 2022-08-09 MED ORDER — PROPOFOL 500 MG/50ML IV EMUL
INTRAVENOUS | Status: DC | PRN
  Administered 2022-08-09: 125 ug/kg/min via INTRAVENOUS

## 2022-08-09 MED ORDER — CHLORHEXIDINE GLUCONATE 0.12 % MT SOLN
15.0000 mL | Freq: Once | OROMUCOSAL | Status: AC
  Administered 2022-08-09: 15 mL via OROMUCOSAL
  Filled 2022-08-09: qty 15

## 2022-08-09 MED ORDER — SUGAMMADEX SODIUM 200 MG/2ML IV SOLN
INTRAVENOUS | Status: DC | PRN
  Administered 2022-08-09: 200 mg via INTRAVENOUS

## 2022-08-09 MED ORDER — PHENYLEPHRINE 80 MCG/ML (10ML) SYRINGE FOR IV PUSH (FOR BLOOD PRESSURE SUPPORT)
PREFILLED_SYRINGE | INTRAVENOUS | Status: DC | PRN
  Administered 2022-08-09 (×2): 80 ug via INTRAVENOUS
  Administered 2022-08-09: 160 ug via INTRAVENOUS

## 2022-08-09 MED ORDER — ONDANSETRON HCL 4 MG/2ML IJ SOLN
INTRAMUSCULAR | Status: DC | PRN
  Administered 2022-08-09: 4 mg via INTRAVENOUS

## 2022-08-09 MED ORDER — PHENYLEPHRINE HCL-NACL 20-0.9 MG/250ML-% IV SOLN
INTRAVENOUS | Status: DC | PRN
  Administered 2022-08-09: 40 ug/min via INTRAVENOUS

## 2022-08-09 MED ORDER — FENTANYL CITRATE (PF) 250 MCG/5ML IJ SOLN
INTRAMUSCULAR | Status: DC | PRN
  Administered 2022-08-09: 100 ug via INTRAVENOUS

## 2022-08-09 NOTE — Anesthesia Postprocedure Evaluation (Signed)
Anesthesia Post Note  Patient: [name]  Procedure(s) Performed: ROBOTIC ASSISTED NAVIGATIONAL BRONCHOSCOPY (Bilateral) BRONCHIAL BIOPSIES BRONCHIAL NEEDLE ASPIRATION BIOPSIES BRONCHIAL BRUSHINGS VIDEO BRONCHOSCOPY WITH ENDOBRONCHIAL ULTRASOUND FINE NEEDLE ASPIRATION (FNA) LINEAR     Patient location during evaluation: PACU Anesthesia Type: General Level of consciousness: awake and alert Pain management: pain level controlled Vital Signs Assessment: post-procedure vital signs reviewed and stable Respiratory status: spontaneous breathing, nonlabored ventilation and respiratory function stable Cardiovascular status: blood pressure returned to baseline and stable Postop Assessment: no apparent nausea or vomiting Anesthetic complications: no   No notable events documented.  Last Vitals:  Vitals:   08/09/22 1350 08/09/22 1400  BP: (!) 126/56 130/70  Pulse: 80 77  Resp: 16 18  Temp:    SpO2: 97% 92%    Last Pain:  Vitals:   08/09/22 1400  TempSrc:   PainSc: 0-No pain   Pain Goal: Patients Stated Pain Goal: 2 (08/09/22 1213)                 Lowella Curb

## 2022-08-09 NOTE — Op Note (Signed)
Video Bronchoscopy with Robotic Assisted Bronchoscopic Navigation  Video Bronchoscopy with Endobronchial Ultrasound Procedure Note  Date of Operation: 08/09/2022   Pre-op Diagnosis: Lung mass   Post-op Diagnosis: Lung mass   Surgeon: Josephine Igo, DO   Assistants: None   Anesthesia: General endotracheal anesthesia  Operation: Flexible video fiberoptic bronchoscopy with robotic assistance and biopsies.  Estimated Blood Loss: Minimal  Complications: None  Indications and History: [name] is a 82 y.o. female with history of lung mass. The risks, benefits, complications, treatment options and expected outcomes were discussed with the patient.  The possibilities of pneumothorax, pneumonia, reaction to medication, pulmonary aspiration, perforation of a viscus, bleeding, failure to diagnose a condition and creating a complication requiring transfusion or operation were discussed with the patient who freely signed the consent.    Description of Procedure: The patient was seen in the Preoperative Area, was examined and was deemed appropriate to proceed.  The patient was taken to [facility] endoscopy room 3, identified as Sharia M Vessell and the procedure verified as Flexible Video Fiberoptic Bronchoscopy.  A Time Out was held and the above information confirmed.   Prior to the date of the procedure a high-resolution CT scan of the chest was performed. Utilizing ION software program a virtual tracheobronchial tree was generated to allow the creation of distinct navigation pathways to the patient's parenchymal abnormalities. After being taken to the operating room general anesthesia was initiated and the patient  was orally intubated. The video fiberoptic bronchoscope was introduced via the endotracheal tube and a general inspection was performed which showed normal right and left lung anatomy, aspiration of the bilateral mainstems was completed to remove any remaining secretions. Robotic catheter  inserted into patient's endotracheal tube.   Target #1 LUL: The distinct navigation pathways prepared prior to this procedure were then utilized to navigate to patient's lesion identified on CT scan. The robotic catheter was secured into place and the vision probe was withdrawn.  Lesion location was approximated using fluoroscopy and 3D CBCT for ct guided needle placement for peripheral targeting. Under fluoroscopic guidance transbronchial needle brushings, transbronchial needle biopsies, and transbronchial forceps biopsies were performed to be sent for cytology and pathology. A bronchioalveolar lavage was performed in the LUL and sent for cytology.  Target #2 Station 7: The video fiberoptic bronchoscope was introduced via the endotracheal tube and a general inspection was performed which showed no evidence of endobronchial lesion. The standard scope was then withdrawn and the endobronchial ultrasound was used to identify and characterize the peritracheal, hilar and bronchial lymph nodes. Inspection showed enlarged subcarinal node, small <25mm node in the left hilum. Using real-time ultrasound guidance Wang needle biopsies were take from Station 7 nodes and were sent for cytology. The patient tolerated the procedure well without apparent complications. There was no significant blood loss. The bronchoscope was withdrawn.   At the end of the procedure a general airway inspection was performed and there was no evidence of active bleeding. The bronchoscope was removed.  The patient tolerated the procedure well. There was no significant blood loss and there were no obvious complications. A post-procedural chest x-ray is pending.  Samples Target #1: 1. Transbronchial needle brushings from LUL 2. Transbronchial Wang needle biopsies from LUL 3. Transbronchial forceps biopsies from LUL 4. Bronchoalveolar lavage from LUL  Samples Target #2: 1. Wang needle biopsies from Station 7 node  Plans:  The patient  will be discharged from the PACU to home when recovered from anesthesia and after  chest x-ray is reviewed. We will review the cytology, pathology and microbiology results with the patient when they become available. Outpatient followup will be with Josephine Igo, DO.  Josephine Igo, DO [facility] 08/09/2022 1:43 PM

## 2022-08-09 NOTE — Transfer of Care (Signed)
Immediate Anesthesia Transfer of Care Note  Patient: [name]  Procedure(s) Performed: ROBOTIC ASSISTED NAVIGATIONAL BRONCHOSCOPY (Bilateral) BRONCHIAL BIOPSIES BRONCHIAL NEEDLE ASPIRATION BIOPSIES BRONCHIAL BRUSHINGS VIDEO BRONCHOSCOPY WITH ENDOBRONCHIAL ULTRASOUND FINE NEEDLE ASPIRATION (FNA) LINEAR  Patient Location: endoscopy unit  Anesthesia Type:General  Level of Consciousness: awake, alert , oriented, and patient cooperative  Airway & Oxygen Therapy: Patient Spontanous Breathing and Patient connected to face mask oxygen  Post-op Assessment: Report given to RN, Post -op Vital signs reviewed and stable, and Patient moving all extremities X 4  Post vital signs: Reviewed and stable  Last Vitals:  Vitals Value Taken Time  BP 116/55 08/09/22 1346  Temp    Pulse 80 08/09/22 1347  Resp 14 08/09/22 1347  SpO2 98 % 08/09/22 1347  Vitals shown include unvalidated device data.  Last Pain:  Vitals:   08/09/22 1213  PainSc: 6       Patients Stated Pain Goal: 2 (08/09/22 1213)  Complications: No notable events documented.

## 2022-08-09 NOTE — Interval H&P Note (Signed)
History and Physical Interval Note:  08/09/2022 12:09 PM  [name]  has presented today for surgery, with the diagnosis of Cavitary lung lesion.  The various methods of treatment have been discussed with the patient and family. After consideration of risks, benefits and other options for treatment, the patient has consented to  Procedure(s): ROBOTIC ASSISTED NAVIGATIONAL BRONCHOSCOPY (Bilateral) as a surgical intervention.  The patient's history has been reviewed, patient examined, no change in status, stable for surgery.  I have reviewed the patient's chart and labs.  Questions were answered to the patient's satisfaction.     Rachel Bo Lyriq Jarchow

## 2022-08-09 NOTE — Discharge Instructions (Signed)

## 2022-08-09 NOTE — H&P (Addendum)
Synopsis: Referred in June 2024 for bronchoscopy  by No ref. provider found  Subjective:   PATIENT ID: [name] GENDER: female DOB: [birth year], MRN: [id number]  No chief complaint on file.   82 year old female seen for a left upper lobe cavitary lesion.  Longstanding history of tobacco abuse.  Has lung nodules documented since 2021 on a neck CT.  Follow-up CT imaging showed presence of the disease and February 2022.  Short-term CT follow-up in May 2022 shows persistent nodules.  Was recommended to have a repeat CT scan in March 2023 however she was referred for lung cancer screening but due to her age was not qualified for LCS and no additional follow-up was obtained.   Now she presents with a repeat CT in April 2024 with an enlarging cavitary lesion in the left upper lobe, growth from 1.0 cm - 3.6 cm cavitary lesion.She did have a positive QuantiFERON gold which has made decision making difficult regarding need for tissue sampling. Dr. Tonia Brooms feels this could be 2 separate processes,  an infection and possible malignancy, and he feels tissue sampling should be done to ensure we do not overlook a possible malignancy.    [name] and I discussed this, and she had questions which were answered. He biggest concern was that she is not scheduled for her procedure until 1:15 pm and she was told to have nothing to eat after midnight. As a diabetic, this was going to be very complicated for her. We have called  Dr. Frutoso Chase office , as they manage her insulin, they confirmed they will call her and instruct her on how to manage her insulin on the day of the procedure. We explained she needs to be NPO 8 hours before procedure, which will be 5 am on 08/09/2022.They verbalized understanding and confirmed they will manage instructions .   08/09/2022: Here today for planned outpatient bronchoscopy     Past Medical History:  Diagnosis Date   AICD (automatic cardioverter/defibrillator) present    Anemia     in the past   Anxiety    Arthritis    Chronic pain    a. Prior h/o chronic pain on methadone.   Chronic systolic CHF (congestive heart failure) (HCC)    a. mixed ischemic/non-ischemic cardiomyopathy. b. s/p CRT-D in 06/2014.   CKD (chronic kidney disease), stage III (HCC)    Complication of anesthesia    hard time waking her up from general surgery   Coronary artery disease    a. remote RCA stenting in 2008 with non-DES. b. Cath 06/2014 following abnormal nuc: Stable, unchanged from prior cath, patent stent and 40% LM.   Depression    Diabetes mellitus (HCC)    GERD (gastroesophageal reflux disease)    Hyperlipidemia    Hypertension    Hypothyroidism    LBBB (left bundle branch block)    Nonischemic cardiomyopathy (HCC)    OSA (obstructive sleep apnea)    AHI-9.77/hr, during REM-50.32/hr  - no cpap at this time   Pneumonia    January 2024   Tobacco abuse      Family History  Problem Relation Age of Onset   Stroke Mother    Hypertension Mother    Coronary artery disease Father    Stroke Brother    Heart disease Brother    Other Brother        H1N1 VIRUS   Healthy Sister    Healthy Sister      Past Surgical History:  Procedure Laterality Date   BACK SURGERY  2013   BI-VENTRICULAR IMPLANTABLE CARDIOVERTER DEFIBRILLATOR N/A 06/11/2014   STJ CRTD implanted by Dr Graciela Husbands   CARDIAC CATHETERIZATION  12/14/2006   RCA stented with a 3.0 [employer] Liberte stent resulting in a reduction of 75% to 0% residual   CHOLECYSTECTOMY     20 years ago   COLONOSCOPY WITH PROPOFOL N/A 12/15/2014   Procedure: COLONOSCOPY WITH PROPOFOL;  Surgeon: Vida Rigger, MD;  Location: [facility] ENDOSCOPY;  Service: Endoscopy;  Laterality: N/A;   EYE SURGERY     bilateral cataract surgery    LEFT AND RIGHT HEART CATHETERIZATION WITH CORONARY ANGIOGRAM N/A 05/29/2014   Procedure: LEFT AND RIGHT HEART CATHETERIZATION WITH CORONARY ANGIOGRAM;  Surgeon: Runell Gess, MD;  Location: [facility] CATH LAB;   Service: Cardiovascular;  Laterality: N/A;   LEFT HEART CATHETERIZATION WITH CORONARY ANGIOGRAM N/A 12/27/2012   Procedure: LEFT HEART CATHETERIZATION WITH CORONARY ANGIOGRAM;  Surgeon: Runell Gess, MD;  Location: [facility] CATH LAB;  Service: Cardiovascular;  Laterality: N/A;    Social History   Socioeconomic History   Marital status: Widowed    Spouse name: Not on file   Number of children: Not on file   Years of education: Not on file   Highest education level: Not on file  Occupational History   Not on file  Tobacco Use   Smoking status: Every Day    Packs/day: 1.00    Years: 58.00    Additional pack years: 0.00    Total pack years: 58.00    Types: Cigarettes    Start date: 12/26/1952    Passive exposure: Past   Smokeless tobacco: Never   Tobacco comments:    Smoke 7 packs of cigarettes a week. 06/15/2022 Tay  Vaping Use   Vaping Use: Never used  Substance and Sexual Activity   Alcohol use: No    Alcohol/week: 0.0 standard drinks of alcohol   Drug use: No   Sexual activity: Not on file  Other Topics Concern   Not on file  Social History Narrative   Patient lives in [place] with his husband and grandson.   Son completed suicide on September 17th of this year, patient found him.   Social Determinants of Health   Financial Resource Strain: Not on file  Food Insecurity: Not on file  Transportation Needs: Not on file  Physical Activity: Not on file  Stress: Not on file  Social Connections: Not on file  Intimate Partner Violence: Not on file     Allergies  Allergen Reactions   Valium [Diazepam] Swelling    face     @ENCMEDSTART @  Review of Systems  Constitutional:  Negative for chills, fever, malaise/fatigue and weight loss.  HENT:  Negative for hearing loss, sore throat and tinnitus.   Eyes:  Negative for blurred vision and double vision.  Respiratory:  Positive for cough. Negative for hemoptysis, sputum production, shortness of breath, wheezing and  stridor.   Cardiovascular:  Negative for chest pain, palpitations, orthopnea, leg swelling and PND.  Gastrointestinal:  Negative for abdominal pain, constipation, diarrhea, heartburn, nausea and vomiting.  Genitourinary:  Negative for dysuria, hematuria and urgency.  Musculoskeletal:  Negative for joint pain and myalgias.  Skin:  Negative for itching and rash.  Neurological:  Negative for dizziness, tingling, weakness and headaches.  Endo/Heme/Allergies:  Negative for environmental allergies. Does not bruise/bleed easily.  Psychiatric/Behavioral:  Negative for depression. The patient is not nervous/anxious and does not have insomnia.  All other systems reviewed and are negative.    Objective:  Physical Exam Vitals reviewed.  Constitutional:      General: She is not in acute distress.    Appearance: She is well-developed.  HENT:     Head: Normocephalic and atraumatic.  Eyes:     General: No scleral icterus.    Conjunctiva/sclera: Conjunctivae normal.     Pupils: Pupils are equal, round, and reactive to light.  Neck:     Vascular: No JVD.     Trachea: No tracheal deviation.  Cardiovascular:     Rate and Rhythm: Normal rate and regular rhythm.     Heart sounds: Normal heart sounds. No murmur heard. Pulmonary:     Effort: Pulmonary effort is normal. No tachypnea, accessory muscle usage or respiratory distress.     Breath sounds: No stridor. No wheezing, rhonchi or rales.  Abdominal:     General: There is no distension.     Palpations: Abdomen is soft.     Tenderness: There is no abdominal tenderness.  Musculoskeletal:        General: No tenderness.     Cervical back: Neck supple.  Lymphadenopathy:     Cervical: No cervical adenopathy.  Skin:    General: Skin is warm and dry.     Capillary Refill: Capillary refill takes less than 2 seconds.     Findings: No rash.  Neurological:     Mental Status: She is alert and oriented to person, place, and time.  Psychiatric:         Behavior: Behavior normal.      Vitals:   08/09/22 1155  BP: 128/61  Pulse: 75  Resp: 17  Temp: 99.2 F (37.3 C)  SpO2: 94%  Weight: 66.7 kg  Height: 5\' 6"  (1.676 m)   94% on RA BMI Readings from Last 3 Encounters:  08/09/22 23.73 kg/m  07/07/22 24.21 kg/m  07/05/22 24.37 kg/m   Wt Readings from Last 3 Encounters:  08/09/22 66.7 kg  07/07/22 68 kg  07/05/22 68.5 kg     CBC    Component Value Date/Time   WBC 7.4 05/12/2022 0852   RBC 4.38 05/12/2022 0852   HGB 13.8 05/12/2022 0852   HGB 15.5 05/14/2019 1704   HCT 40.5 05/12/2022 0852   HCT 44.9 05/14/2019 1704   PLT 222.0 05/12/2022 0852   PLT 235 05/14/2019 1704   MCV 92.5 05/12/2022 0852   MCV 94 05/14/2019 1704   MCH 32.6 05/14/2019 1704   MCH 29.9 08/31/2016 1132   MCHC 34.0 05/12/2022 0852   RDW 14.1 05/12/2022 0852   RDW 12.6 05/14/2019 1704   LYMPHSABS 1.4 10/05/2021 1001   LYMPHSABS 1.2 07/21/2017 1500   MONOABS 0.5 10/05/2021 1001   EOSABS 0.2 10/05/2021 1001   EOSABS 0.2 07/21/2017 1500   BASOSABS 0.0 10/05/2021 1001   BASOSABS 0.0 07/21/2017 1500     Chest Imaging: CT imaging reviewed   Pulmonary Functions Testing Results:     No data to display          FeNO:   Pathology:   Echocardiogram:   Heart Catheterization:     Assessment & Plan:     ICD-10-CM   1. Cavitary lesion of lung  J98.4 CANCELED: SARS CORONAVIRUS 2 (TAT 6-24 HRS) Anterior Nasal Swab      Discussion:  Patient here today for planned outpatient bronchoscopy  We will plan for cultures as well as tissue bx to rule out malignancy  Case  discussed with ID     Current Facility-Administered Medications:    chlorhexidine (PERIDEX) 0.12 % solution 15 mL, 15 mL, Mouth/Throat, Once, Hyacinth Meeker, Hazle Nordmann, MD   lactated ringers infusion, , Intravenous, Continuous, Hyacinth Meeker Hazle Nordmann, MD   Josephine Igo, DO [facility] 08/09/2022 12:06 PM

## 2022-08-09 NOTE — Anesthesia Procedure Notes (Signed)
Procedure Name: Intubation Date/Time: 08/09/2022 12:53 PM  Performed by: Cheree Ditto, CRNAPre-anesthesia Checklist: Patient identified, Emergency Drugs available, Suction available and Patient being monitored Patient Re-evaluated:Patient Re-evaluated prior to induction Oxygen Delivery Method: Circle system utilized Preoxygenation: Pre-oxygenation with 100% oxygen Induction Type: IV induction Ventilation: Mask ventilation without difficulty Laryngoscope Size: Mac and 3 Grade View: Grade I Tube type: Oral Tube size: 8.5 mm Number of attempts: 1 Airway Equipment and Method: Stylet and Oral airway Placement Confirmation: ETT inserted through vocal cords under direct vision, positive ETCO2 and breath sounds checked- equal and bilateral Secured at: 22 cm Tube secured with: Tape Dental Injury: Teeth and Oropharynx as per pre-operative assessment

## 2022-08-10 LAB — CULTURE, BAL-QUANTITATIVE W GRAM STAIN

## 2022-08-11 LAB — AEROBIC/ANAEROBIC CULTURE W GRAM STAIN (SURGICAL/DEEP WOUND): Gram Stain: NONE SEEN

## 2022-08-11 LAB — CULTURE, BAL-QUANTITATIVE W GRAM STAIN: Culture: NO GROWTH

## 2022-08-11 LAB — CYTOLOGY - NON PAP

## 2022-08-11 LAB — ACID FAST SMEAR (AFB, MYCOBACTERIA): Acid Fast Smear: NEGATIVE

## 2022-08-12 LAB — ACID FAST SMEAR (AFB, MYCOBACTERIA): Acid Fast Smear: POSITIVE — AB

## 2022-08-12 LAB — AEROBIC/ANAEROBIC CULTURE W GRAM STAIN (SURGICAL/DEEP WOUND)

## 2022-08-13 ENCOUNTER — Telehealth: Payer: Self-pay | Admitting: Student

## 2022-08-13 LAB — AEROBIC/ANAEROBIC CULTURE W GRAM STAIN (SURGICAL/DEEP WOUND)

## 2022-08-13 NOTE — Telephone Encounter (Signed)
Called in to our answering service feeling weak and sleepy. Chart reviewed. Likely NTM infection. If confusion, or develops associated dyspnea then I encouraged her to head to ED. Otherwise I recommended she attend follow up appointments with Kandice Robinsons 6/11 and with ID 6/13. I did page on call ID on the off chance there would be any role for initiating treatment over the weekend but I cautioned the patient that this would be unlikely.   [facility]

## 2022-08-14 ENCOUNTER — Encounter (HOSPITAL_COMMUNITY): Payer: Self-pay | Admitting: Pulmonary Disease

## 2022-08-14 LAB — FUNGUS CULTURE WITH STAIN

## 2022-08-14 LAB — FUNGUS CULTURE RESULT

## 2022-08-14 LAB — AEROBIC/ANAEROBIC CULTURE W GRAM STAIN (SURGICAL/DEEP WOUND): Culture: NORMAL

## 2022-08-15 NOTE — Progress Notes (Unsigned)
History of Present Illness [name] is a 82 y.o. female with ***   08/15/2022  Test Results: Called in to our answering service feeling weak and sleepy. Chart reviewed. Likely NTM infection. If confusion, or develops associated dyspnea then I encouraged her to head to ED. Otherwise I recommended she attend follow up appointments with Kandice Robinsons 6/11 and with ID 6/13. I did page on call ID on the off chance there would be any role for initiating treatment over the weekend but I cautioned the patient that this would be unlikely.      Latest Ref Rng & Units 08/09/2022   12:29 PM 05/12/2022    8:52 AM 10/05/2021   10:01 AM  CBC  WBC 4.0 - 10.5 K/uL  7.4  6.6   Hemoglobin 12.0 - 15.0 g/dL 16.1  09.6  04.5   Hematocrit 36.0 - 46.0 % 34.0  40.5  42.0   Platelets 150.0 - 400.0 K/uL  222.0  201.0        Latest Ref Rng & Units 08/09/2022   12:29 PM 05/12/2022    8:52 AM 10/05/2021   10:01 AM  BMP  Glucose 70 - 99 mg/dL 409  811  914   BUN 8 - 23 mg/dL 19  15  11    Creatinine 0.44 - 1.00 mg/dL 7.82  9.56  2.13   Sodium 135 - 145 mmol/L 133  138  140   Potassium 3.5 - 5.1 mmol/L 4.1  4.2  3.5   Chloride 98 - 111 mmol/L 108  98  100   CO2 19 - 32 mEq/L  31  32   Calcium 8.4 - 10.5 mg/dL  9.9  9.8     BNP No results found for: "BNP"  ProBNP    Component Value Date/Time   PROBNP 82.0 07/30/2014 1623    PFT No results found for: "FEV1PRE", "FEV1POST", "FVCPRE", "FVCPOST", "TLC", "DLCOUNC", "PREFEV1FVCRT", "PSTFEV1FVCRT"  DG Chest Port 1 View  Result Date: 08/09/2022 CLINICAL DATA:  Status post bronchoscopy with biopsy EXAM: PORTABLE CHEST 1 VIEW COMPARISON:  PET-CT 07/28/2022, CT chest 06/09/2022. FINDINGS: The left chest wall cardiac device is stable. Nodular opacity is seen in the left apex corresponding to the cavitary lesion seen on prior CT. There is no other focal airspace opacity. There is no pulmonary edema. There is no pleural effusion. There is no evidence of pneumothorax  following biopsy. There is no acute osseous abnormality. IMPRESSION: No evidence of complication following bronchoscopy and biopsy. Electronically Signed   By: Lesia Hausen M.D.   On: 08/09/2022 14:26   DG C-ARM BRONCHOSCOPY  Result Date: 08/09/2022 C-ARM BRONCHOSCOPY: Fluoroscopy was utilized by the requesting physician.  No radiographic interpretation.   NM PET Image Initial (PI) Skull Base To Thigh (F-18 FDG)  Result Date: 07/28/2022 CLINICAL DATA:  Initial treatment strategy for cavitary lung mass. EXAM: NUCLEAR MEDICINE PET SKULL BASE TO THIGH TECHNIQUE: 7.4 mCi F-18 FDG was injected intravenously. Full-ring PET imaging was performed from the skull base to thigh after the radiotracer. CT data was obtained and used for attenuation correction and anatomic localization. Fasting blood glucose: 88 mg/dl COMPARISON:  CT chest dated 06/09/2022 FINDINGS: Mediastinal blood pool activity: SUV max 2.1 Liver activity: SUV max NA NECK: No hypermetabolic cervical lymphadenopathy. Incidental CT findings: None. CHEST: 3.1 x 2.6 cm thick-walled cavitary lesion in the left upper lobe, max SUV 6.1. Adjacent scattered left upper lobe nodularity, max SUV 9.2 in the medial left upper lobe. Additional  peribronchovascular nodularity in the central right upper lobe, max SUV 4.5. Additional patchy/peribronchovascular nodularity in the left lower lobe, max SUV 6.0. These findings are new/progressive from recent CT and favor infection/pneumonia. Dominant 10 mm short axis subcarinal node, max SUV 4.3. Incidental CT findings: Atherosclerotic calcifications of the aortic arch. Moderate coronary atherosclerosis of the LAD and right coronary artery. Left subclavian pacemaker. ABDOMEN/PELVIS: No abnormal hypermetabolism in the liver, spleen, pancreas, or adrenal glands. No hypermetabolic abdominopelvic lymphadenopathy. Incidental CT findings: Status post cholecystectomy. Mild left renal atrophy. Atherosclerotic calcifications of the  abdominal aorta and branch vessels. SKELETON: No focal hypermetabolic activity to suggest skeletal metastasis. Incidental CT findings: Lumbar spine fixation hardware. Degenerative changes of the visualized thoracolumbar spine. IMPRESSION: 3.1 cm thick-walled cavitary lesion in the left upper lobe. While this technically may be related to an infectious/inflammatory process, primary bronchogenic neoplasm such as squamous cell carcinoma is considered the diagnosis of exclusion. Additional scattered nodularity in the left upper lobe, similar to the prior, indeterminate. Additional scattered nodularity, particularly in the right upper lobe and left lower lobe, is new from the prior and favors infection/pneumonia. Mildly prominent subcarinal node, indeterminate in this setting. Electronically Signed   By: Charline Bills M.D.   On: 07/28/2022 10:44   CUP PACEART REMOTE DEVICE CHECK  Result Date: 07/21/2022 Scheduled remote reviewed. Normal device function.  The device estimates 3.1 months until ERI. Next remote 07/25/2022. [facility], [facility].    Past medical hx Past Medical History:  Diagnosis Date   AICD (automatic cardioverter/defibrillator) present    Anemia    in the past   Anxiety    Arthritis    Chronic pain    a. Prior h/o chronic pain on methadone.   Chronic systolic CHF (congestive heart failure) (HCC)    a. mixed ischemic/non-ischemic cardiomyopathy. b. s/p CRT-D in 06/2014.   CKD (chronic kidney disease), stage III (HCC)    Complication of anesthesia    hard time waking her up from general surgery   Coronary artery disease    a. remote RCA stenting in 2008 with non-DES. b. Cath 06/2014 following abnormal nuc: Stable, unchanged from prior cath, patent stent and 40% LM.   Depression    Diabetes mellitus (HCC)    GERD (gastroesophageal reflux disease)    Hyperlipidemia    Hypertension    Hypothyroidism    LBBB (left bundle branch block)    Nonischemic cardiomyopathy (HCC)    OSA (obstructive  sleep apnea)    AHI-9.77/hr, during REM-50.32/hr  - no cpap at this time   Pneumonia    January 2024   Tobacco abuse      Social History   Tobacco Use   Smoking status: Every Day    Packs/day: 1.00    Years: 58.00    Additional pack years: 0.00    Total pack years: 58.00    Types: Cigarettes    Start date: 12/26/1951    Passive exposure: Past   Smokeless tobacco: Never   Tobacco comments:    Smoke 7 packs of cigarettes a week. 06/15/2022 Tay  Vaping Use   Vaping Use: Never used  Substance Use Topics   Alcohol use: No    Alcohol/week: 0.0 standard drinks of alcohol   Drug use: No    Ms.Pierre reports that she has been smoking cigarettes. She started smoking about 69 years ago. She has a 58.00 pack-year smoking history. She has been exposed to tobacco smoke. She has never used smokeless tobacco. She reports that  she does not drink alcohol and does not use drugs.  Tobacco Cessation: Ready to quit: Not Answered Counseling given: Not Answered Tobacco comments: Smoke 7 packs of cigarettes a week. 06/15/2022 Dellis Filbert   Past surgical hx, Family hx, Social hx all reviewed.  Current Outpatient Medications on File Prior to Visit  Medication Sig   Accu-Chek Softclix Lancets lancets TEST BLOOD SUGAR 1 TO 4 TIMES DAILY AS DIRECTED.   Alcohol Swabs (DROPSAFE ALCOHOL PREP) 70 % PADS Apply topically.   aspirin EC 81 MG tablet Take 81 mg by mouth every evening.   Blood Glucose Monitoring Suppl (ACCU-CHEK GUIDE) w/Device KIT USE AS DIRECTED   carvedilol (COREG) 12.5 MG tablet TAKE 1 AND 1/2 TABLETS TWICE DAILY   cholecalciferol (VITAMIN D3) 25 MCG (1000 UNIT) tablet Take 1,000 Units by mouth daily.   diclofenac Sodium (VOLTAREN) 1 % GEL Apply 1 Application topically 2 (two) times daily as needed (back pain).   furosemide (LASIX) 40 MG tablet TAKE 2 TABLETS ONE TIME DAILY IN THE MORNING AND TAKE 1 TABLET EVERY EVENING (MUST KEEP APPT FOR FUTURE REFILLS)   Garlic 500 MG CAPS Take 500 mg by mouth  daily.   glucose blood (ACCU-CHEK GUIDE) test strip TEST BLOOD SUGAR 1 TO 4 TIMES DAILY AS DIRECTED   insulin degludec (TRESIBA FLEXTOUCH) 100 UNIT/ML FlexTouch Pen Inject 16 Units into the skin daily. (Patient taking differently: Inject 16 Units into the skin at bedtime.)   Insulin Pen Needle (BD PEN NEEDLE MICRO U/F) 32G X 6 MM MISC USE NEW NEEDLE FOR EACH INJECTION OF INSULIN, FOUR TIMES DAILY   ipratropium (ATROVENT) 0.03 % nasal spray Place 2 sprays into both nostrils 2 (two) times daily. (Patient taking differently: Place 2 sprays into both nostrils 2 (two) times daily as needed for rhinitis.)   levothyroxine (SYNTHROID) 100 MCG tablet Take 1 tablet (100 mcg total) by mouth daily before breakfast. (Patient taking differently: Take 100 mcg by mouth every morning.)   mirtazapine (REMERON) 15 MG tablet Take 1 tablet (15 mg total) by mouth at bedtime. (Patient not taking: Reported on 08/04/2022)   nitroGLYCERIN (NITROSTAT) 0.4 MG SL tablet Place 1 tablet (0.4 mg total) under the tongue every 5 (five) minutes as needed for chest pain.   oxyCODONE-acetaminophen (PERCOCET/ROXICET) 5-325 MG tablet Take 1 tablet by mouth every 8 (eight) hours as needed for severe pain.   potassium chloride SA (KLOR-CON M) 20 MEQ tablet TAKE 1 TABLET TWICE DAILY ON MONDAY, WEDNESDAY AND FRIDAY (DAYS YOU TAKE LASIX/FUROSEMIDE)   Propylene Glycol (SYSTANE COMPLETE) 0.6 % SOLN Place 1 drop into both eyes as needed (dry eyes).   ramelteon (ROZEREM) 8 MG tablet Take 1 tablet (8 mg total) by mouth at bedtime. (Patient not taking: Reported on 08/04/2022)   sertraline (ZOLOFT) 25 MG tablet Take 1 tablet (25 mg total) by mouth daily. (Patient not taking: Reported on 07/05/2022)   simvastatin (ZOCOR) 20 MG tablet Take 1 tablet (20 mg total) by mouth daily with supper.   triamcinolone cream (KENALOG) 0.1 % APPLY ONE APPLICATION TOPICALLY TWO TIMES DAILY (Patient not taking: Reported on 08/04/2022)   No current facility-administered  medications on file prior to visit.     Allergies  Allergen Reactions   Valium [Diazepam] Swelling    face    Review Of Systems:  Constitutional:   No  weight loss, night sweats,  Fevers, chills, fatigue, or  lassitude.  HEENT:   No headaches,  Difficulty swallowing,  Tooth/dental problems, or  Sore  throat,                No sneezing, itching, ear ache, nasal congestion, post nasal drip,   CV:  No chest pain,  Orthopnea, PND, swelling in lower extremities, anasarca, dizziness, palpitations, syncope.   GI  No heartburn, indigestion, abdominal pain, nausea, vomiting, diarrhea, change in bowel habits, loss of appetite, bloody stools.   Resp: No shortness of breath with exertion or at rest.  No excess mucus, no productive cough,  No non-productive cough,  No coughing up of blood.  No change in color of mucus.  No wheezing.  No chest wall deformity  Skin: no rash or lesions.  GU: no dysuria, change in color of urine, no urgency or frequency.  No flank pain, no hematuria   MS:  No joint pain or swelling.  No decreased range of motion.  No back pain.  Psych:  No change in mood or affect. No depression or anxiety.  No memory loss.   Vital Signs There were no vitals taken for this visit.   Physical Exam:  General- No distress,  A&Ox3 ENT: No sinus tenderness, TM clear, pale nasal mucosa, no oral exudate,no post nasal drip, no LAN Cardiac: S1, S2, regular rate and rhythm, no murmur Chest: No wheeze/ rales/ dullness; no accessory muscle use, no nasal flaring, no sternal retractions Abd.: Soft Non-tender Ext: No clubbing cyanosis, edema Neuro:  normal strength Skin: No rashes, warm and dry Psych: normal mood and behavior   Assessment/Plan  No problem-specific Assessment & Plan notes found for this encounter.    Bevelyn Ngo, NP 08/15/2022  10:05 AM

## 2022-08-16 ENCOUNTER — Ambulatory Visit (INDEPENDENT_AMBULATORY_CARE_PROVIDER_SITE_OTHER): Payer: Medicare HMO | Admitting: Acute Care

## 2022-08-16 ENCOUNTER — Encounter: Payer: Self-pay | Admitting: Acute Care

## 2022-08-16 DIAGNOSIS — R918 Other nonspecific abnormal finding of lung field: Secondary | ICD-10-CM

## 2022-08-16 NOTE — Progress Notes (Signed)
Concha Pyo bronch cultures.   Thanks,  BLI  Josephine Igo, DO [facility] 08/16/2022 11:11 AM

## 2022-08-16 NOTE — Progress Notes (Signed)
Virtual Visit via Telephone Note  I connected with [name] on 08/16/22 at 11:00 AM EDT by telephone and verified that I am speaking with the correct person using two identifiers.  Location: Patient:  At home Provider: [address] [place], [place], [address]    I discussed the limitations, risks, security and privacy concerns of performing an evaluation and management service by telephone and the availability of in person appointments. I also discussed with the patient that there may be a patient responsible charge related to this service. The patient expressed understanding and agreed to proceed.     History of Present Illness: 82 year old female seen for a left upper lobe cavitary lesion.  Longstanding history of tobacco abuse.  Has lung nodules documented since 2021 on a neck CT.  Follow-up CT imaging showed presence of the disease and February 2022.  Short-term CT follow-up in May 2022 shows persistent nodules.  Was recommended to have a repeat CT scan in March 2023 however she was referred for lung cancer screening but due to her age was not qualified for LCS and no additional follow-up was obtained.   Now she presents with a repeat CT in April 2024 with an enlarging cavitary lesion in the left upper lobe, growth from 1.0 cm - 3.6 cm cavitary lesion.She did have a positive QuantiFERON gold which has made decision making difficult regarding need for tissue sampling. Dr. Tonia Bradley feels this could be 2 separate processes,  an infection and possible malignancy, and he feels tissue sampling should be done to ensure we do not overlook a possible malignancy. She was scheduled for Bronchoscopy with biopsies  08/09/2022.   She states she has done well after her biopsy. No coughing up blood. No shortness of breath, no sore throat. She did call the office 6/8 pm as she was sleepy and felt weak. She thought she was having a reaction to anesthesia from her procedure .She spoke with Dr. Thora Bradley, who  told her to go to the ED if she developed any  confusion, or develops associated dyspnea. She states her symptoms self resolved and that she actually feels better than she has in awhile since the bronch.   She presents today to discuss results. Biopsy was negative for malignancy, however micro obtained with BAL was positive for AFB. Dr. Tonia Bradley feels this is all infection. She has been referred to ID, and has an appointment with them 08/18/2022. She has been treated by them in the past, and knows where their office is located   Observations/Objective: Cytology 08/09/2022  FINAL MICROSCOPIC DIAGNOSIS:  E. LYMPH NODE, STATION 7, FINE NEEDLE ASPIRATION:  - Negative for malignancy  - Compatible with a benign lymph node   Acid Fast Smear Positive Abnormal   Comment: (NOTE) 3+, 4-36 acid-fast bacilli per field at 400X magnification, fluorescent smear    Assessment and Plan: Cavitary Lung mass  Biopsy negative for cancer AFB Positive BAL Positive QuantiFERON gold  Post Bronchoscopy Plan Your Biopsy was negative for cancer which is great news. The cultures done during the biopsy were positive for AFB, which is Acid Fast Bacillus.  This is a bacteria which will need to be treated by Infectious Disease Providers.  You have an appointment 08/18/2022 at 02:45 PM in Infectious Diseases Allison Band, MD) to be evaluated and determine best treatment options for this bacterial infection in your lung.   They will take great care of you. Follow up here as needed. Please contact office for  sooner follow up if symptoms do not improve or worsen or seek emergency care  Follow Up Instructions: Pt has appointment with 08/18/2022 at 02:45 PM in Infectious Diseases Allison Band, MD)     Dx: Cavitary lesion of lung     I discussed the assessment and treatment plan with the patient. The patient was provided an opportunity to ask questions and all were answered. The patient agreed with the plan and demonstrated an  understanding of the instructions.   The patient was advised to call back or seek an in-person evaluation if the symptoms worsen or if the condition fails to improve as anticipated.  I provided 35 minutes of non-face-to-face time during this encounter.   Allison Ngo, NP  08/16/2022

## 2022-08-16 NOTE — Patient Instructions (Addendum)
It ws good to talk with you today. Your Biopsy was negative for cancer which is great news. The cultures done during the biopsy were positive for AFB, which is Acid Fast Bacillus.  This is a bacteria which will need to be treated by Infectious Disease Providers.  You have an appointment 08/18/2022 at 02:45 PM in Infectious Diseases Raymondo Band, MD) to be evaluated and determine best treatment options for this bacterial infection in your lung.   They will take great care of you. Follow up here as needed. Please contact office for sooner follow up if symptoms do not improve or worsen or seek emergency care

## 2022-08-17 LAB — ACID FAST ID BY PCR AND SUSCEPTIBILITIES: M Tuberculosis Complex: NEGATIVE

## 2022-08-17 NOTE — Progress Notes (Signed)
FYI   Thanks,  BLI  Josephine Igo, DO [facility] 08/17/2022 2:46 PM

## 2022-08-18 ENCOUNTER — Ambulatory Visit: Payer: Medicare HMO | Admitting: Internal Medicine

## 2022-08-18 ENCOUNTER — Ambulatory Visit: Payer: Medicare HMO | Admitting: Family

## 2022-08-22 ENCOUNTER — Ambulatory Visit: Payer: Medicare HMO | Attending: Internal Medicine

## 2022-08-22 ENCOUNTER — Telehealth: Payer: Self-pay | Admitting: Pulmonary Disease

## 2022-08-22 DIAGNOSIS — I5032 Chronic diastolic (congestive) heart failure: Secondary | ICD-10-CM

## 2022-08-22 LAB — ACID FAST ID BY PCR AND SUSCEPTIBILITIES
M Tuberculosis Complex: NEGATIVE
M avium complex: POSITIVE — AB

## 2022-08-22 LAB — ACID FAST CULTURE WITH REFLEXED SENSITIVITIES (MYCOBACTERIA)

## 2022-08-22 NOTE — Telephone Encounter (Signed)
Spoke with patient. She states since Biopsy she has been feeling weak and just wants to sleep. She states she doesn't have a appetite, has been forcing herself to try and eat.   Maralyn Sago can you please advise?

## 2022-08-22 NOTE — Telephone Encounter (Signed)
Patient states has been feeling weak since biopsy. States just wants to sleep. Patient phone number is [phone].

## 2022-08-23 ENCOUNTER — Ambulatory Visit: Payer: Medicare HMO | Admitting: Internal Medicine

## 2022-08-23 ENCOUNTER — Ambulatory Visit: Payer: Medicare HMO | Admitting: Pulmonary Disease

## 2022-08-23 ENCOUNTER — Encounter: Payer: Self-pay | Admitting: Pulmonary Disease

## 2022-08-23 VITALS — BP 98/50 | HR 83 | Ht 66.0 in | Wt 150.4 lb

## 2022-08-23 DIAGNOSIS — A310 Pulmonary mycobacterial infection: Secondary | ICD-10-CM | POA: Diagnosis not present

## 2022-08-23 DIAGNOSIS — R7612 Nonspecific reaction to cell mediated immunity measurement of gamma interferon antigen response without active tuberculosis: Secondary | ICD-10-CM

## 2022-08-23 DIAGNOSIS — F17200 Nicotine dependence, unspecified, uncomplicated: Secondary | ICD-10-CM

## 2022-08-23 DIAGNOSIS — J984 Other disorders of lung: Secondary | ICD-10-CM | POA: Diagnosis not present

## 2022-08-23 LAB — ACID FAST ID BY PCR AND SUSCEPTIBILITIES: M avium complex: POSITIVE — AB

## 2022-08-23 LAB — ACID FAST CULTURE WITH REFLEXED SENSITIVITIES (MYCOBACTERIA): Acid Fast Culture: POSITIVE — AB

## 2022-08-23 NOTE — Progress Notes (Signed)
Synopsis: Referred in April 2024 for lung mass by Myrlene Broker, *  Subjective:   PATIENT ID: [name] GENDER: female DOB: Jul 06, 1940, MRN: [id number]  Chief Complaint  Patient presents with   Follow-up    No energy, sob, and sleeping a lot.     This is an 82 year old female seen today with past medical history of anxiety, chronic pain, coronary artery disease, diabetes, gastroesophageal reflux, hyperlipidemia, hypertension and longstanding history of tobacco abuse.  Patient was referred today for evaluation after having abnormal CT chest she does have a coronary artery disease and was on Plavix in the past.  She does have a cardiac device placed and followed by EP.The patient had CT imaging of the chest completed on 06/09/2022 which revealed a interval progression of the left upper lobe pulmonary nodule compared to prior imaging in May 2022.  OV 08/23/2022: Sputum was initially positive for MAC.  TB PCR negative QuantiFERON was positive.  She was taken for bronchoscopy and biopsy to rule out malignancy which showed reactive cells no cancer identified.  She referred back to infectious disease.  She is yet to see them in follow-up still having cough sputum production and now feeling more rundown less energy, loss of appetite.  Denies hemoptysis      Past Medical History:  Diagnosis Date   AICD (automatic cardioverter/defibrillator) present    Anemia    in the past   Anxiety    Arthritis    Chronic pain    a. Prior h/o chronic pain on methadone.   Chronic systolic CHF (congestive heart failure) (HCC)    a. mixed ischemic/non-ischemic cardiomyopathy. b. s/p CRT-D in 06/2014.   CKD (chronic kidney disease), stage III (HCC)    Complication of anesthesia    hard time waking her up from general surgery   Coronary artery disease    a. remote RCA stenting in 2008 with non-DES. b. Cath 06/2014 following abnormal nuc: Stable, unchanged from prior cath, patent stent and 40% LM.    Depression    Diabetes mellitus (HCC)    GERD (gastroesophageal reflux disease)    Hyperlipidemia    Hypertension    Hypothyroidism    LBBB (left bundle branch block)    Nonischemic cardiomyopathy (HCC)    OSA (obstructive sleep apnea)    AHI-9.77/hr, during REM-50.32/hr  - no cpap at this time   Pneumonia    January 2024   Tobacco abuse      Family History  Problem Relation Age of Onset   Stroke Mother    Hypertension Mother    Coronary artery disease Father    Stroke Brother    Heart disease Brother    Other Brother        H1N1 VIRUS   Healthy Sister    Healthy Sister      Past Surgical History:  Procedure Laterality Date   BACK SURGERY  2013   BI-VENTRICULAR IMPLANTABLE CARDIOVERTER DEFIBRILLATOR N/A 06/11/2014   STJ CRTD implanted by Dr Graciela Husbands   BRONCHIAL BIOPSY  08/09/2022   Procedure: BRONCHIAL BIOPSIES;  Surgeon: Josephine Igo, DO;  Location: [facility];  Service: Pulmonary;;   BRONCHIAL BRUSHINGS  08/09/2022   Procedure: BRONCHIAL BRUSHINGS;  Surgeon: Josephine Igo, DO;  Location: [facility];  Service: Pulmonary;;   BRONCHIAL NEEDLE ASPIRATION BIOPSY  08/09/2022   Procedure: BRONCHIAL NEEDLE ASPIRATION BIOPSIES;  Surgeon: Josephine Igo, DO;  Location: [facility];  Service: Pulmonary;;   CARDIAC CATHETERIZATION  12/14/2006  RCA stented with a 3.0 [employer] Liberte stent resulting in a reduction of 75% to 0% residual   CHOLECYSTECTOMY     20 years ago   COLONOSCOPY WITH PROPOFOL N/A 12/15/2014   Procedure: COLONOSCOPY WITH PROPOFOL;  Surgeon: Vida Rigger, MD;  Location: [facility];  Service: Endoscopy;  Laterality: N/A;   EYE SURGERY     bilateral cataract surgery    FINE NEEDLE ASPIRATION  08/09/2022   Procedure: FINE NEEDLE ASPIRATION (FNA) LINEAR;  Surgeon: Josephine Igo, DO;  Location: [facility];  Service: Pulmonary;;   LEFT AND RIGHT HEART CATHETERIZATION WITH CORONARY ANGIOGRAM N/A 05/29/2014   Procedure: LEFT AND RIGHT HEART  CATHETERIZATION WITH CORONARY ANGIOGRAM;  Surgeon: Runell Gess, MD;  Location: [facility] CATH LAB;  Service: Cardiovascular;  Laterality: N/A;   LEFT HEART CATHETERIZATION WITH CORONARY ANGIOGRAM N/A 12/27/2012   Procedure: LEFT HEART CATHETERIZATION WITH CORONARY ANGIOGRAM;  Surgeon: Runell Gess, MD;  Location: [facility] CATH LAB;  Service: Cardiovascular;  Laterality: N/A;   VIDEO BRONCHOSCOPY WITH ENDOBRONCHIAL ULTRASOUND  08/09/2022   Procedure: VIDEO BRONCHOSCOPY WITH ENDOBRONCHIAL ULTRASOUND;  Surgeon: Josephine Igo, DO;  Location: [facility];  Service: Pulmonary;;    Social History   Socioeconomic History   Marital status: Widowed    Spouse name: Not on file   Number of children: Not on file   Years of education: Not on file   Highest education level: Not on file  Occupational History   Not on file  Tobacco Use   Smoking status: Every Day    Packs/day: 1.00    Years: 58.00    Additional pack years: 0.00    Total pack years: 58.00    Types: Cigarettes    Start date: 12/26/1952    Passive exposure: Past   Smokeless tobacco: Never   Tobacco comments:    Smoke 2 packs of cigarettes a week. 08/23/2022 Tay  Vaping Use   Vaping Use: Never used  Substance and Sexual Activity   Alcohol use: No    Alcohol/week: 0.0 standard drinks of alcohol   Drug use: No   Sexual activity: Not on file  Other Topics Concern   Not on file  Social History Narrative   Patient lives in [place] with his husband and grandson.   Son completed suicide on September 17th of this year, patient found him.   Social Determinants of Health   Financial Resource Strain: Not on file  Food Insecurity: Not on file  Transportation Needs: Not on file  Physical Activity: Not on file  Stress: Not on file  Social Connections: Not on file  Intimate Partner Violence: Not on file     Allergies  Allergen Reactions   Valium [Diazepam] Swelling    face     Outpatient Medications Prior to Visit  Medication  Sig Dispense Refill   Accu-Chek Softclix Lancets lancets TEST BLOOD SUGAR 1 TO 4 TIMES DAILY AS DIRECTED. 400 each 11   Alcohol Swabs (DROPSAFE ALCOHOL PREP) 70 % PADS Apply topically.     aspirin EC 81 MG tablet Take 81 mg by mouth every evening.     Blood Glucose Monitoring Suppl (ACCU-CHEK GUIDE) w/Device KIT USE AS DIRECTED 1 kit 0   carvedilol (COREG) 12.5 MG tablet TAKE 1 AND 1/2 TABLETS TWICE DAILY 270 tablet 3   cholecalciferol (VITAMIN D3) 25 MCG (1000 UNIT) tablet Take 1,000 Units by mouth daily.     diclofenac Sodium (VOLTAREN) 1 % GEL Apply 1 Application topically  2 (two) times daily as needed (back pain).     furosemide (LASIX) 40 MG tablet TAKE 2 TABLETS ONE TIME DAILY IN THE MORNING AND TAKE 1 TABLET EVERY EVENING (MUST KEEP APPT FOR FUTURE REFILLS) 270 tablet 2   Garlic 500 MG CAPS Take 500 mg by mouth daily.     glucose blood (ACCU-CHEK GUIDE) test strip TEST BLOOD SUGAR 1 TO 4 TIMES DAILY AS DIRECTED 350 strip 11   insulin degludec (TRESIBA FLEXTOUCH) 100 UNIT/ML FlexTouch Pen Inject 16 Units into the skin daily. (Patient taking differently: Inject 16 Units into the skin at bedtime.) 15 mL 3   Insulin Pen Needle (BD PEN NEEDLE MICRO U/F) 32G X 6 MM MISC USE NEW NEEDLE FOR EACH INJECTION OF INSULIN, FOUR TIMES DAILY 100 each 5   ipratropium (ATROVENT) 0.03 % nasal spray Place 2 sprays into both nostrils 2 (two) times daily. (Patient taking differently: Place 2 sprays into both nostrils 2 (two) times daily as needed for rhinitis.) 30 mL 3   levothyroxine (SYNTHROID) 100 MCG tablet Take 1 tablet (100 mcg total) by mouth daily before breakfast. (Patient taking differently: Take 100 mcg by mouth every morning.) 90 tablet 3   oxyCODONE-acetaminophen (PERCOCET/ROXICET) 5-325 MG tablet Take 1 tablet by mouth every 8 (eight) hours as needed for severe pain.     potassium chloride SA (KLOR-CON M) 20 MEQ tablet TAKE 1 TABLET TWICE DAILY ON MONDAY, WEDNESDAY AND FRIDAY (DAYS YOU TAKE  LASIX/FUROSEMIDE) 78 tablet 10   Propylene Glycol (SYSTANE COMPLETE) 0.6 % SOLN Place 1 drop into both eyes as needed (dry eyes).     simvastatin (ZOCOR) 20 MG tablet Take 1 tablet (20 mg total) by mouth daily with supper. 90 tablet 3   triamcinolone cream (KENALOG) 0.1 % APPLY ONE APPLICATION TOPICALLY TWO TIMES DAILY 30 g 0   mirtazapine (REMERON) 15 MG tablet Take 1 tablet (15 mg total) by mouth at bedtime. (Patient not taking: Reported on 08/04/2022) 30 tablet 0   nitroGLYCERIN (NITROSTAT) 0.4 MG SL tablet Place 1 tablet (0.4 mg total) under the tongue every 5 (five) minutes as needed for chest pain. (Patient not taking: Reported on 08/16/2022) 25 tablet 3   ramelteon (ROZEREM) 8 MG tablet Take 1 tablet (8 mg total) by mouth at bedtime. (Patient not taking: Reported on 08/04/2022) 30 tablet 3   sertraline (ZOLOFT) 25 MG tablet Take 1 tablet (25 mg total) by mouth daily. (Patient not taking: Reported on 07/05/2022) 90 tablet 3   No facility-administered medications prior to visit.    Review of Systems  Constitutional:  Positive for malaise/fatigue. Negative for chills, fever and weight loss.  HENT:  Negative for hearing loss, sore throat and tinnitus.   Eyes:  Negative for blurred vision and double vision.  Respiratory:  Positive for cough, sputum production and shortness of breath. Negative for hemoptysis, wheezing and stridor.   Cardiovascular:  Negative for chest pain, palpitations, orthopnea, leg swelling and PND.  Gastrointestinal:  Negative for abdominal pain, constipation, diarrhea, heartburn, nausea and vomiting.  Genitourinary:  Negative for dysuria, hematuria and urgency.  Musculoskeletal:  Negative for joint pain and myalgias.  Skin:  Negative for itching and rash.  Neurological:  Negative for dizziness, tingling, weakness and headaches.  Endo/Heme/Allergies:  Negative for environmental allergies. Does not bruise/bleed easily.  Psychiatric/Behavioral:  Negative for depression. The  patient is not nervous/anxious and does not have insomnia.   All other systems reviewed and are negative.    Objective:  Physical  Exam Vitals reviewed.  Constitutional:      General: She is not in acute distress.    Appearance: She is well-developed.  HENT:     Head: Normocephalic and atraumatic.  Eyes:     General: No scleral icterus.    Conjunctiva/sclera: Conjunctivae normal.     Pupils: Pupils are equal, round, and reactive to light.  Neck:     Vascular: No JVD.     Trachea: No tracheal deviation.  Cardiovascular:     Rate and Rhythm: Normal rate and regular rhythm.     Heart sounds: Normal heart sounds. No murmur heard. Pulmonary:     Effort: Pulmonary effort is normal. No tachypnea, accessory muscle usage or respiratory distress.     Breath sounds: No stridor. Rhonchi present. No wheezing or rales.     Comments: Diminished breath sounds bilaterally Abdominal:     General: There is no distension.     Palpations: Abdomen is soft.     Tenderness: There is no abdominal tenderness.  Musculoskeletal:        General: No tenderness.     Cervical back: Neck supple.  Lymphadenopathy:     Cervical: No cervical adenopathy.  Skin:    General: Skin is warm and dry.     Capillary Refill: Capillary refill takes less than 2 seconds.     Findings: No rash.  Neurological:     Mental Status: She is alert and oriented to person, place, and time.  Psychiatric:        Behavior: Behavior normal.      Vitals:   08/23/22 1429  BP: (!) 98/50  Pulse: 83  SpO2: 92%  Weight: 150 lb 6.4 oz (68.2 kg)  Height: 5\' 6"  (1.676 m)   92% on RA BMI Readings from Last 3 Encounters:  08/23/22 24.28 kg/m  08/09/22 23.73 kg/m  07/07/22 24.21 kg/m   Wt Readings from Last 3 Encounters:  08/23/22 150 lb 6.4 oz (68.2 kg)  08/09/22 147 lb (66.7 kg)  07/07/22 150 lb (68 kg)     CBC    Component Value Date/Time   WBC 7.4 05/12/2022 0852   RBC 4.38 05/12/2022 0852   HGB 11.6 (L)  08/09/2022 1229   HGB 15.5 05/14/2019 1704   HCT 34.0 (L) 08/09/2022 1229   HCT 44.9 05/14/2019 1704   PLT 222.0 05/12/2022 0852   PLT 235 05/14/2019 1704   MCV 92.5 05/12/2022 0852   MCV 94 05/14/2019 1704   MCH 32.6 05/14/2019 1704   MCH 29.9 08/31/2016 1132   MCHC 34.0 05/12/2022 0852   RDW 14.1 05/12/2022 0852   RDW 12.6 05/14/2019 1704   LYMPHSABS 1.4 10/05/2021 1001   LYMPHSABS 1.2 07/21/2017 1500   MONOABS 0.5 10/05/2021 1001   EOSABS 0.2 10/05/2021 1001   EOSABS 0.2 07/21/2017 1500   BASOSABS 0.0 10/05/2021 1001   BASOSABS 0.0 07/21/2017 1500      Chest Imaging:  06/09/2022 CT chest without contrast: Interval progression of the left upper lobe pulmonary nodularity dating back since May 2022 now presents with a 3.6 cm cavitary lesion. The patient's images have been independently reviewed by me.    Pulmonary Functions Testing Results:     No data to display          FeNO:   Pathology:   Echocardiogram:   Heart Catheterization:     Assessment & Plan:     ICD-10-CM   1. Cavitary lesion of lung  J98.4  2. Current smoker  F17.200     3. Positive QuantiFERON-TB Gold test  R76.12     4. Mycobacterium avium complex (HCC)  A31.0       Discussion:  This is a 82 year old female seen initially for left upper lobe cavitary lesion, longstanding history of tobacco abuse, sputum cultures positive for MAC, as well as bronchoscopy cultures positive for MAC.  Patient has seen and establish care with infectious disease.  She is having ongoing symptoms with shortness of breath fatigue weight loss and early satiety.  Plan: Encouraged her to see infectious disease again to discuss the pros and cons of consideration for drug regimen treatment for NTM. I think this is the next best step for her.  I have sent a message over so they can hopefully get her worked in sooner rather than later.    Current Outpatient Medications:    Accu-Chek Softclix Lancets lancets,  TEST BLOOD SUGAR 1 TO 4 TIMES DAILY AS DIRECTED., Disp: 400 each, Rfl: 11   Alcohol Swabs (DROPSAFE ALCOHOL PREP) 70 % PADS, Apply topically., Disp: , Rfl:    aspirin EC 81 MG tablet, Take 81 mg by mouth every evening., Disp: , Rfl:    Blood Glucose Monitoring Suppl (ACCU-CHEK GUIDE) w/Device KIT, USE AS DIRECTED, Disp: 1 kit, Rfl: 0   carvedilol (COREG) 12.5 MG tablet, TAKE 1 AND 1/2 TABLETS TWICE DAILY, Disp: 270 tablet, Rfl: 3   cholecalciferol (VITAMIN D3) 25 MCG (1000 UNIT) tablet, Take 1,000 Units by mouth daily., Disp: , Rfl:    diclofenac Sodium (VOLTAREN) 1 % GEL, Apply 1 Application topically 2 (two) times daily as needed (back pain)., Disp: , Rfl:    furosemide (LASIX) 40 MG tablet, TAKE 2 TABLETS ONE TIME DAILY IN THE MORNING AND TAKE 1 TABLET EVERY EVENING (MUST KEEP APPT FOR FUTURE REFILLS), Disp: 270 tablet, Rfl: 2   Garlic 500 MG CAPS, Take 500 mg by mouth daily., Disp: , Rfl:    glucose blood (ACCU-CHEK GUIDE) test strip, TEST BLOOD SUGAR 1 TO 4 TIMES DAILY AS DIRECTED, Disp: 350 strip, Rfl: 11   insulin degludec (TRESIBA FLEXTOUCH) 100 UNIT/ML FlexTouch Pen, Inject 16 Units into the skin daily. (Patient taking differently: Inject 16 Units into the skin at bedtime.), Disp: 15 mL, Rfl: 3   Insulin Pen Needle (BD PEN NEEDLE MICRO U/F) 32G X 6 MM MISC, USE NEW NEEDLE FOR EACH INJECTION OF INSULIN, FOUR TIMES DAILY, Disp: 100 each, Rfl: 5   ipratropium (ATROVENT) 0.03 % nasal spray, Place 2 sprays into both nostrils 2 (two) times daily. (Patient taking differently: Place 2 sprays into both nostrils 2 (two) times daily as needed for rhinitis.), Disp: 30 mL, Rfl: 3   levothyroxine (SYNTHROID) 100 MCG tablet, Take 1 tablet (100 mcg total) by mouth daily before breakfast. (Patient taking differently: Take 100 mcg by mouth every morning.), Disp: 90 tablet, Rfl: 3   oxyCODONE-acetaminophen (PERCOCET/ROXICET) 5-325 MG tablet, Take 1 tablet by mouth every 8 (eight) hours as needed for severe  pain., Disp: , Rfl:    potassium chloride SA (KLOR-CON M) 20 MEQ tablet, TAKE 1 TABLET TWICE DAILY ON MONDAY, WEDNESDAY AND FRIDAY (DAYS YOU TAKE LASIX/FUROSEMIDE), Disp: 78 tablet, Rfl: 10   Propylene Glycol (SYSTANE COMPLETE) 0.6 % SOLN, Place 1 drop into both eyes as needed (dry eyes)., Disp: , Rfl:    simvastatin (ZOCOR) 20 MG tablet, Take 1 tablet (20 mg total) by mouth daily with supper., Disp: 90 tablet, Rfl: 3  triamcinolone cream (KENALOG) 0.1 %, APPLY ONE APPLICATION TOPICALLY TWO TIMES DAILY, Disp: 30 g, Rfl: 0   mirtazapine (REMERON) 15 MG tablet, Take 1 tablet (15 mg total) by mouth at bedtime. (Patient not taking: Reported on 08/04/2022), Disp: 30 tablet, Rfl: 0   nitroGLYCERIN (NITROSTAT) 0.4 MG SL tablet, Place 1 tablet (0.4 mg total) under the tongue every 5 (five) minutes as needed for chest pain. (Patient not taking: Reported on 08/16/2022), Disp: 25 tablet, Rfl: 3   ramelteon (ROZEREM) 8 MG tablet, Take 1 tablet (8 mg total) by mouth at bedtime. (Patient not taking: Reported on 08/04/2022), Disp: 30 tablet, Rfl: 3   sertraline (ZOLOFT) 25 MG tablet, Take 1 tablet (25 mg total) by mouth daily. (Patient not taking: Reported on 07/05/2022), Disp: 90 tablet, Rfl: 3   Josephine Igo, DO [facility] 08/23/2022 2:45 PM

## 2022-08-23 NOTE — Patient Instructions (Signed)
Thank you for visiting Dr. Tonia Brooms at [facility]. Today we recommend the following:  You need to see infectious disease to start treatment options.   Return if symptoms worsen or fail to improve.    Please do your part to reduce the spread of COVID-19.

## 2022-08-23 NOTE — Telephone Encounter (Signed)
Patient checking on urgent message sent. Patient phone number is [phone].

## 2022-08-23 NOTE — Telephone Encounter (Signed)
Called and spoke with patient. She stated that since she hadn't heard anything from yesterday and she was not feeling any better, she wanted to see Dr. Tonia Brooms. I was able to get her scheduled for this afternoon at 215pm. She verbalized understanding.   Nothing further needed at time of call.

## 2022-08-24 ENCOUNTER — Ambulatory Visit: Payer: Medicare HMO | Admitting: Internal Medicine

## 2022-08-24 ENCOUNTER — Telehealth: Payer: Self-pay

## 2022-08-24 LAB — CUP PACEART REMOTE DEVICE CHECK
Battery Remaining Longevity: 0 mo
Battery Voltage: 2.59 V
Brady Statistic AP VP Percent: 11 %
Brady Statistic AP VS Percent: 1 %
Brady Statistic AS VP Percent: 89 %
Brady Statistic AS VS Percent: 1 %
Brady Statistic RA Percent Paced: 11 %
Date Time Interrogation Session: 20240618172108
HighPow Impedance: 78 Ohm
HighPow Impedance: 78 Ohm
Implantable Lead Connection Status: 753985
Implantable Lead Connection Status: 753985
Implantable Lead Connection Status: 753985
Implantable Lead Implant Date: 20160406
Implantable Lead Implant Date: 20160406
Implantable Lead Implant Date: 20160406
Implantable Lead Location: 753858
Implantable Lead Location: 753859
Implantable Lead Location: 753860
Implantable Lead Model: 7122
Implantable Pulse Generator Implant Date: 20160406
Lead Channel Impedance Value: 1425 Ohm
Lead Channel Impedance Value: 380 Ohm
Lead Channel Impedance Value: 690 Ohm
Lead Channel Pacing Threshold Amplitude: 0.75 V
Lead Channel Pacing Threshold Amplitude: 0.75 V
Lead Channel Pacing Threshold Amplitude: 0.75 V
Lead Channel Pacing Threshold Pulse Width: 0.5 ms
Lead Channel Pacing Threshold Pulse Width: 0.5 ms
Lead Channel Pacing Threshold Pulse Width: 0.5 ms
Lead Channel Sensing Intrinsic Amplitude: 12 mV
Lead Channel Sensing Intrinsic Amplitude: 5 mV
Lead Channel Setting Pacing Amplitude: 1.75 V
Lead Channel Setting Pacing Amplitude: 2 V
Lead Channel Setting Pacing Amplitude: 2 V
Lead Channel Setting Pacing Pulse Width: 0.5 ms
Lead Channel Setting Pacing Pulse Width: 0.5 ms
Lead Channel Setting Sensing Sensitivity: 0.5 mV
Pulse Gen Serial Number: 7199559

## 2022-08-24 NOTE — Telephone Encounter (Signed)
Patient called office to schedule earlier appointment due to feeling worse. States that she has been feeling weak, is only able to walk short distances, and has no energy. Was seen by pulmonary who recommend she see ID to discuss treatment options. Is scheduled to see Dr. Elinor Parkinson on 7/11.  Patient is concerned that appointment is too far out. No earlier appointment available at this time. Will send message to provider so she is aware.  Advised patient if she feels worse or starts feeling SOB to go to ED.  [name], RMA

## 2022-08-26 ENCOUNTER — Ambulatory Visit: Payer: Medicare HMO | Admitting: Physician Assistant

## 2022-08-29 ENCOUNTER — Ambulatory Visit: Payer: Medicare HMO | Admitting: Internal Medicine

## 2022-08-30 ENCOUNTER — Ambulatory Visit (INDEPENDENT_AMBULATORY_CARE_PROVIDER_SITE_OTHER): Payer: Medicare HMO | Admitting: Internal Medicine

## 2022-08-30 ENCOUNTER — Encounter: Payer: Self-pay | Admitting: Internal Medicine

## 2022-08-30 VITALS — BP 100/60 | HR 88 | Temp 98.3°F | Ht 66.0 in | Wt 146.0 lb

## 2022-08-30 DIAGNOSIS — E871 Hypo-osmolality and hyponatremia: Secondary | ICD-10-CM | POA: Diagnosis not present

## 2022-08-30 DIAGNOSIS — R531 Weakness: Secondary | ICD-10-CM | POA: Diagnosis not present

## 2022-08-30 DIAGNOSIS — A310 Pulmonary mycobacterial infection: Secondary | ICD-10-CM | POA: Diagnosis not present

## 2022-08-30 LAB — CBC WITH DIFFERENTIAL/PLATELET
Basophils Absolute: 0.1 10*3/uL (ref 0.0–0.1)
Basophils Relative: 0.9 % (ref 0.0–3.0)
Eosinophils Absolute: 0.2 10*3/uL (ref 0.0–0.7)
Eosinophils Relative: 2.3 % (ref 0.0–5.0)
HCT: 35.7 % — ABNORMAL LOW (ref 36.0–46.0)
Hemoglobin: 11.8 g/dL — ABNORMAL LOW (ref 12.0–15.0)
Lymphocytes Relative: 13.1 % (ref 12.0–46.0)
Lymphs Abs: 1.3 10*3/uL (ref 0.7–4.0)
MCHC: 33.1 g/dL (ref 30.0–36.0)
MCV: 89.9 fl (ref 78.0–100.0)
Monocytes Absolute: 0.8 10*3/uL (ref 0.1–1.0)
Monocytes Relative: 8.2 % (ref 3.0–12.0)
Neutro Abs: 7.4 10*3/uL (ref 1.4–7.7)
Neutrophils Relative %: 75.5 % (ref 43.0–77.0)
Platelets: 396 10*3/uL (ref 150.0–400.0)
RBC: 3.97 Mil/uL (ref 3.87–5.11)
RDW: 13.8 % (ref 11.5–15.5)
WBC: 9.8 10*3/uL (ref 4.0–10.5)

## 2022-08-30 LAB — COMPREHENSIVE METABOLIC PANEL
ALT: 30 U/L (ref 0–35)
AST: 22 U/L (ref 0–37)
Albumin: 3.4 g/dL — ABNORMAL LOW (ref 3.5–5.2)
Alkaline Phosphatase: 86 U/L (ref 39–117)
BUN: 28 mg/dL — ABNORMAL HIGH (ref 6–23)
CO2: 31 mEq/L (ref 19–32)
Calcium: 10.8 mg/dL — ABNORMAL HIGH (ref 8.4–10.5)
Chloride: 91 mEq/L — ABNORMAL LOW (ref 96–112)
Creatinine, Ser: 1.32 mg/dL — ABNORMAL HIGH (ref 0.40–1.20)
GFR: 38.05 mL/min — ABNORMAL LOW (ref >60.00)
Glucose, Bld: 117 mg/dL — ABNORMAL HIGH (ref 70–99)
Potassium: 4.5 mEq/L (ref 3.5–5.1)
Sodium: 131 mEq/L — ABNORMAL LOW (ref 135–145)
Total Bilirubin: 0.7 mg/dL (ref 0.2–1.2)
Total Protein: 7.7 g/dL (ref 6.0–8.3)

## 2022-08-30 LAB — BRAIN NATRIURETIC PEPTIDE: Pro B Natriuretic peptide (BNP): 34 pg/mL (ref 0.0–100.0)

## 2022-08-30 LAB — TSH: TSH: 5.74 u[IU]/mL — ABNORMAL HIGH (ref 0.35–5.50)

## 2022-08-30 NOTE — Progress Notes (Signed)
   Subjective:   Patient ID: [name], female    DOB: 1941-01-20, 82 y.o.   MRN: [id number]  HPI The patient is an 82 YO female coming in for weakness and some mild confusion since lung biopsy (she states 05/09/22 but this was done 08/09/22). She denies change in cough or breathing. Appetite is fine. No diarrhea, constipation or nausea. No chest pains. Biopsy shown to be nonTB mycobacterium seeing ID in a few weeks. Before biopsy no limitations to activity or ROM or energy.   Review of Systems  Constitutional:  Positive for activity change and fatigue.  HENT: Negative.    Eyes: Negative.   Respiratory:  Negative for cough, chest tightness and shortness of breath.   Cardiovascular:  Negative for chest pain, palpitations and leg swelling.  Gastrointestinal:  Negative for abdominal distention, abdominal pain, constipation, diarrhea, nausea and vomiting.  Musculoskeletal: Negative.   Skin: Negative.   Neurological:  Positive for weakness.  Psychiatric/Behavioral:  Positive for confusion.     Objective:  Physical Exam Constitutional:      Appearance: She is well-developed.  HENT:     Head: Normocephalic and atraumatic.  Cardiovascular:     Rate and Rhythm: Normal rate and regular rhythm.  Pulmonary:     Effort: Pulmonary effort is normal. No respiratory distress.     Breath sounds: Normal breath sounds. No wheezing or rales.  Abdominal:     General: Bowel sounds are normal. There is no distension.     Palpations: Abdomen is soft.     Tenderness: There is no abdominal tenderness. There is no rebound.  Musculoskeletal:     Cervical back: Normal range of motion.  Skin:    General: Skin is warm and dry.  Neurological:     Mental Status: She is alert.     Motor: Weakness present.     Coordination: Coordination abnormal.     Comments: Needed wheelchair was unable to walk through office, some confusion during visit on timing of events which is not usual for her     Vitals:    08/30/22 1531  BP: 100/60  Pulse: 88  Temp: 98.3 F (36.8 C)  TempSrc: Oral  SpO2: 95%  Weight: 146 lb (66.2 kg)  Height: 5\' 6"  (1.676 m)    Assessment & Plan:  Visit time 25 minutes in face to face communication with patient and coordination of care, additional 5 minutes spent in record review, coordination or care, ordering tests, communicating/referring to other healthcare professionals, documenting in medical records all on the same day of the visit for total time 30 minutes spent on the visit.

## 2022-08-30 NOTE — Assessment & Plan Note (Signed)
Needs assessment of vitamin B12, D, CBC, CMP, TSH, U/A, CMP, direct calcium. She had low sodium and calcium either of which could cause new weakness on labs prior to biopsy 08/09/22. Treat as appropriate but this condition is not normal for patient. I do not feel it is associated with the newly discovered nonTB mycobacterium as this was likely chronic and indolent. She does not have change in cough or SOB.

## 2022-08-30 NOTE — Assessment & Plan Note (Signed)
New sodium of 133 on 08/09/22 accompanied by weakness, confusion since biopsy. Never rechecked. Checking CMP and CBC and TSH. She appears euvolemic today but has heart failure.

## 2022-08-30 NOTE — Assessment & Plan Note (Signed)
Newly diagnosed with culture and biopsy and sputum culture. She has upcoming visit with ID. I was explaining to her today prior to biopsy she did not have chronic cough or limitations to activity due to breathing and may or may not need treatment for this. Her new sudden weakness is unclear if related. I suspect it could be unrelated. Checking labs to ensure new metabolic cause as she had low calcium and sodium on labs before biopsy which is more likely related to mild confusion and weakness and U/A to rule out infection.

## 2022-08-30 NOTE — Patient Instructions (Addendum)
We will check the labs today to check the calcium and sodium as these were both low on recent labs.

## 2022-08-30 NOTE — Assessment & Plan Note (Signed)
Checking CMP and calcium direct today to assess low calcium on 08/09/22. She does have new weakness and some mild confusion.

## 2022-08-31 ENCOUNTER — Encounter (HOSPITAL_COMMUNITY): Payer: Self-pay | Admitting: Emergency Medicine

## 2022-08-31 ENCOUNTER — Emergency Department (HOSPITAL_COMMUNITY)
Admission: EM | Admit: 2022-08-31 | Discharge: 2022-08-31 | Disposition: A | Discharge status: Home / Self Care | Payer: Medicare HMO | Attending: Emergency Medicine | Admitting: Emergency Medicine

## 2022-08-31 ENCOUNTER — Emergency Department (HOSPITAL_COMMUNITY): Payer: Medicare HMO

## 2022-08-31 DIAGNOSIS — Z79899 Other long term (current) drug therapy: Secondary | ICD-10-CM | POA: Insufficient documentation

## 2022-08-31 DIAGNOSIS — N3000 Acute cystitis without hematuria: Secondary | ICD-10-CM | POA: Diagnosis not present

## 2022-08-31 DIAGNOSIS — Z794 Long term (current) use of insulin: Secondary | ICD-10-CM | POA: Diagnosis not present

## 2022-08-31 DIAGNOSIS — N189 Chronic kidney disease, unspecified: Secondary | ICD-10-CM | POA: Insufficient documentation

## 2022-08-31 DIAGNOSIS — D72829 Elevated white blood cell count, unspecified: Secondary | ICD-10-CM | POA: Insufficient documentation

## 2022-08-31 DIAGNOSIS — I509 Heart failure, unspecified: Secondary | ICD-10-CM | POA: Insufficient documentation

## 2022-08-31 DIAGNOSIS — I2510 Atherosclerotic heart disease of native coronary artery without angina pectoris: Secondary | ICD-10-CM | POA: Diagnosis not present

## 2022-08-31 DIAGNOSIS — J984 Other disorders of lung: Secondary | ICD-10-CM | POA: Diagnosis not present

## 2022-08-31 DIAGNOSIS — R918 Other nonspecific abnormal finding of lung field: Secondary | ICD-10-CM | POA: Diagnosis not present

## 2022-08-31 DIAGNOSIS — E119 Type 2 diabetes mellitus without complications: Secondary | ICD-10-CM | POA: Insufficient documentation

## 2022-08-31 DIAGNOSIS — R059 Cough, unspecified: Secondary | ICD-10-CM | POA: Diagnosis not present

## 2022-08-31 DIAGNOSIS — I130 Hypertensive heart and chronic kidney disease with heart failure and stage 1 through stage 4 chronic kidney disease, or unspecified chronic kidney disease: Secondary | ICD-10-CM | POA: Insufficient documentation

## 2022-08-31 DIAGNOSIS — R531 Weakness: Secondary | ICD-10-CM | POA: Diagnosis not present

## 2022-08-31 LAB — URINALYSIS, ROUTINE W REFLEX MICROSCOPIC
Bilirubin Urine: NEGATIVE
Bilirubin Urine: NEGATIVE
Glucose, UA: NEGATIVE mg/dL
Hgb urine dipstick: NEGATIVE
Hgb urine dipstick: NEGATIVE
Ketones, ur: NEGATIVE
Ketones, ur: NEGATIVE mg/dL
Nitrite: NEGATIVE
Nitrite: NEGATIVE
Protein, ur: NEGATIVE mg/dL
RBC / HPF: NONE SEEN (ref 0–?)
Specific Gravity, Urine: 1.01 (ref 1.000–1.030)
Specific Gravity, Urine: 1.009 (ref 1.005–1.030)
Total Protein, Urine: NEGATIVE
Urine Glucose: NEGATIVE
Urobilinogen, UA: 1 (ref 0.0–1.0)
WBC, UA: >50 WBC/hpf (ref 0–5)
pH: 6 (ref 5.0–8.0)
pH: 6 (ref 5.0–8.0)

## 2022-08-31 LAB — BASIC METABOLIC PANEL
Anion gap: 12 (ref 5–15)
BUN: 27 mg/dL — ABNORMAL HIGH (ref 8–23)
CO2: 26 mmol/L (ref 22–32)
Calcium: 9.8 mg/dL (ref 8.9–10.3)
Chloride: 94 mmol/L — ABNORMAL LOW (ref 98–111)
Creatinine, Ser: 1.57 mg/dL — ABNORMAL HIGH (ref 0.44–1.00)
GFR, Estimated: 33 mL/min — ABNORMAL LOW (ref >60)
Glucose, Bld: 141 mg/dL — ABNORMAL HIGH (ref 70–99)
Potassium: 4 mmol/L (ref 3.5–5.1)
Sodium: 132 mmol/L — ABNORMAL LOW (ref 135–145)

## 2022-08-31 LAB — CBC
HCT: 36.8 % (ref 36.0–46.0)
Hemoglobin: 12.1 g/dL (ref 12.0–15.0)
MCH: 30.4 pg (ref 26.0–34.0)
MCHC: 32.9 g/dL (ref 30.0–36.0)
MCV: 92.5 fL (ref 80.0–100.0)
Platelets: 379 10*3/uL (ref 150–400)
RBC: 3.98 MIL/uL (ref 3.87–5.11)
RDW: 13.2 % (ref 11.5–15.5)
WBC: 10.7 10*3/uL — ABNORMAL HIGH (ref 4.0–10.5)
nRBC: 0 % (ref 0.0–0.2)

## 2022-08-31 LAB — CBG MONITORING, ED: Glucose-Capillary: 132 mg/dL — ABNORMAL HIGH (ref 70–99)

## 2022-08-31 LAB — VITAMIN D 25 HYDROXY (VIT D DEFICIENCY, FRACTURES): VITD: 100.03 ng/mL — ABNORMAL HIGH (ref 30.00–100.00)

## 2022-08-31 LAB — VITAMIN B12: Vitamin B-12: 418 pg/mL (ref 211–911)

## 2022-08-31 MED ORDER — LACTATED RINGERS IV BOLUS: 1000.0000 mL | Freq: Once | INTRAVENOUS | Status: DC

## 2022-08-31 MED ORDER — CEPHALEXIN 500 MG PO CAPS: 500.0000 mg | ORAL_CAPSULE | Freq: Two times a day (BID) | ORAL | 0 refills | Status: AC

## 2022-08-31 NOTE — Discharge Instructions (Addendum)
We will treat you with antibiotics for your urinary tract infection.  Follow-up with your PCP regarding your additional laboratory evaluation performed yesterday.  Your sodium is trending up and you are not acutely confused.  You do not meet criteria for inpatient admission at this time.

## 2022-08-31 NOTE — ED Notes (Signed)
Pt attempting to leave AMA. As this paramedic was setting up signature pad, EDP walked in. Pt stayed and cooperated. BP rechecked.

## 2022-08-31 NOTE — ED Triage Notes (Signed)
Pt endorses weakness for a long time. Pt had labs drawn at PCP yesterday that showed Na 131. Reports dark urine, UA done at PCP.

## 2022-08-31 NOTE — ED Notes (Signed)
X-ray at bedside

## 2022-08-31 NOTE — ED Provider Notes (Signed)
[facility] AT [facility] Provider Note   CSN: [id number] Arrival date & time: 08/31/22  1302     History  Chief Complaint  Patient presents with   Weakness   Abnormal Lab    [name] is a 82 y.o. female.   Weakness Abnormal Lab    82 year old female with medical history significant for OSA, CAD, HTN, HLD, DM, CKD, CHF and nonischemic cardiomyopathy, GERD anxiety depression who presents to the emergency department with intermittent confusion and generalized weakness and fatigue.  The patient was mildly confused in the office at her PCP yesterday and was advised to present to the emergency department for further evaluation.  She presents today but states that "I feel fine."  She states that she is ready to go home.  She endorses some increased urinary frequency.  She states that she has a chronic cough but denies any increased production of sputum.  She denies any fevers or chills.  She denies any chest pain or shortness of breath or lower extremity swelling.  The patient had a lung biopsy a few months ago and states "I have not felt right since the biopsy."  She endorses a chronic cough.  She was told the results of her biopsy were negative.  She did have a bronchoscopy which was positive on culture for Mycobacterium avium complex.  She has yet to follow-up with infectious disease regarding this.  Home Medications Prior to Admission medications   Medication Sig Start Date End Date Taking? Authorizing Provider  cephALEXin (KEFLEX) 500 MG capsule Take 1 capsule (500 mg total) by mouth 2 (two) times daily for 5 days. 08/31/22 09/05/22 Yes Ernie Avena, MD  Accu-Chek Softclix Lancets lancets TEST BLOOD SUGAR 1 TO 4 TIMES DAILY AS DIRECTED. 09/08/21   Janeece Agee, NP  Alcohol Swabs (DROPSAFE ALCOHOL PREP) 70 % PADS Apply topically. 06/29/22   [provider]  aspirin EC 81 MG tablet Take 81 mg by mouth every evening.    [provider]   Blood Glucose Monitoring Suppl (ACCU-CHEK GUIDE) w/Device KIT USE AS DIRECTED 09/20/21   Janeece Agee, NP  carvedilol (COREG) 12.5 MG tablet TAKE 1 AND 1/2 TABLETS TWICE DAILY 04/07/22   Runell Gess, MD  cholecalciferol (VITAMIN D3) 25 MCG (1000 UNIT) tablet Take 1,000 Units by mouth daily.    [provider]  diclofenac Sodium (VOLTAREN) 1 % GEL Apply 1 Application topically 2 (two) times daily as needed (back pain). 06/17/16   [provider]  furosemide (LASIX) 40 MG tablet TAKE 2 TABLETS ONE TIME DAILY IN THE MORNING AND TAKE 1 TABLET EVERY EVENING (MUST KEEP APPT FOR FUTURE REFILLS) 12/22/21   Runell Gess, MD  Garlic 500 MG CAPS Take 500 mg by mouth daily.    [provider]  glucose blood (ACCU-CHEK GUIDE) test strip TEST BLOOD SUGAR 1 TO 4 TIMES DAILY AS DIRECTED 09/22/21   Janeece Agee, NP  insulin degludec (TRESIBA FLEXTOUCH) 100 UNIT/ML FlexTouch Pen Inject 16 Units into the skin daily. Patient taking differently: Inject 16 Units into the skin at bedtime. 06/10/22   Myrlene Broker, MD  Insulin Pen Needle (BD PEN NEEDLE MICRO U/F) 32G X 6 MM MISC USE NEW NEEDLE FOR EACH INJECTION OF INSULIN, FOUR TIMES DAILY 06/10/22   Myrlene Broker, MD  ipratropium (ATROVENT) 0.03 % nasal spray Place 2 sprays into both nostrils 2 (two) times daily. Patient taking differently: Place 2 sprays into both nostrils 2 (  two) times daily as needed for rhinitis. 06/10/22   Myrlene Broker, MD  levothyroxine (SYNTHROID) 100 MCG tablet Take 1 tablet (100 mcg total) by mouth daily before breakfast. Patient taking differently: Take 100 mcg by mouth every morning. 06/10/22   Myrlene Broker, MD  mirtazapine (REMERON) 15 MG tablet Take 1 tablet (15 mg total) by mouth at bedtime. Patient not taking: Reported on 08/04/2022 07/07/22   Myrlene Broker, MD  nitroGLYCERIN (NITROSTAT) 0.4 MG SL tablet Place 1 tablet (0.4 mg total) under the tongue every 5 (five)  minutes as needed for chest pain. Patient not taking: Reported on 08/16/2022 05/06/19   Abelino Derrick, PA-C  oxyCODONE-acetaminophen (PERCOCET/ROXICET) 5-325 MG tablet Take 1 tablet by mouth every 8 (eight) hours as needed for severe pain.    [provider]  potassium chloride SA (KLOR-CON M) 20 MEQ tablet TAKE 1 TABLET TWICE DAILY ON MONDAY, WEDNESDAY AND FRIDAY (DAYS YOU TAKE LASIX/FUROSEMIDE) 01/10/22   Runell Gess, MD  Propylene Glycol (SYSTANE COMPLETE) 0.6 % SOLN Place 1 drop into both eyes as needed (dry eyes).    [provider]  ramelteon (ROZEREM) 8 MG tablet Take 1 tablet (8 mg total) by mouth at bedtime. Patient not taking: Reported on 08/04/2022 07/05/22   Myrlene Broker, MD  sertraline (ZOLOFT) 25 MG tablet Take 1 tablet (25 mg total) by mouth daily. Patient not taking: Reported on 07/05/2022 06/10/22   Myrlene Broker, MD  simvastatin (ZOCOR) 20 MG tablet Take 1 tablet (20 mg total) by mouth daily with supper. 06/10/22   Myrlene Broker, MD  triamcinolone cream (KENALOG) 0.1 % APPLY ONE APPLICATION TOPICALLY TWO TIMES DAILY 11/28/18   Lezlie Lye, Meda Coffee, MD      Allergies    Valium [diazepam]    Review of Systems   Review of Systems  Constitutional:  Positive for fatigue.  Neurological:  Positive for weakness.  All other systems reviewed and are negative.   Physical Exam Updated Vital Signs BP 101/80   Pulse 72   Temp 97.7 F (36.5 C) (Oral)   Resp 16   Ht 5\' 6"  (1.676 m)   Wt 66 kg   SpO2 100%   BMI 23.48 kg/m  Physical Exam Vitals and nursing note reviewed.  Constitutional:      General: She is not in acute distress.    Appearance: She is well-developed.     Comments: GCS 15, AAOx3  HENT:     Head: Normocephalic and atraumatic.  Eyes:     Conjunctiva/sclera: Conjunctivae normal.  Cardiovascular:     Rate and Rhythm: Normal rate and regular rhythm.     Heart sounds: No murmur heard. Pulmonary:     Effort: Pulmonary  effort is normal. No respiratory distress.     Breath sounds: Normal breath sounds.  Abdominal:     Palpations: Abdomen is soft.     Tenderness: There is no abdominal tenderness.  Musculoskeletal:        General: No swelling.     Cervical back: Neck supple.  Skin:    General: Skin is warm and dry.     Capillary Refill: Capillary refill takes less than 2 seconds.  Neurological:     Mental Status: She is alert.     Comments: MENTAL STATUS EXAM:    Orientation: Alert and oriented to person, place and time.  Memory: Cooperative, follows commands well.  Language: Speech is clear and language is normal.  CRANIAL NERVES:    CN 2 (Optic): Visual fields intact to confrontation.  CN 3,4,6 (EOM): Pupils equal and reactive to light. Full extraocular eye movement without nystagmus.  CN 5 (Trigeminal): Facial sensation is normal, no weakness of masticatory muscles.  CN 7 (Facial): No facial weakness or asymmetry.  CN 8 (Auditory): Auditory acuity grossly normal.  CN 9,10 (Glossophar): The uvula is midline, the palate elevates symmetrically.  CN 11 (spinal access): Normal sternocleidomastoid and trapezius strength.  CN 12 (Hypoglossal): The tongue is midline. No atrophy or fasciculations.Marland Kitchen   MOTOR:  Muscle Strength: 5/5RUE, 5/5LUE, 5/5RLE, 5/5LLE.   COORDINATION:   No tremor.   SENSATION:   Intact to light touch all four extremities.     Psychiatric:        Mood and Affect: Mood normal.     ED Results / Procedures / Treatments   Labs (all labs ordered are listed, but only abnormal results are displayed) Labs Reviewed  BASIC METABOLIC PANEL - Abnormal; Notable for the following components:      Result Value   Sodium 132 (*)    Chloride 94 (*)    Glucose, Bld 141 (*)    BUN 27 (*)    Creatinine, Ser 1.57 (*)    GFR, Estimated 33 (*)    All other components within normal limits  CBC - Abnormal; Notable for the following components:   WBC 10.7 (*)    All other components within  normal limits  URINALYSIS, ROUTINE W REFLEX MICROSCOPIC - Abnormal; Notable for the following components:   APPearance CLOUDY (*)    Leukocytes,Ua LARGE (*)    Bacteria, UA FEW (*)    All other components within normal limits  CBG MONITORING, ED - Abnormal; Notable for the following components:   Glucose-Capillary 132 (*)    All other components within normal limits  URINE CULTURE    EKG EKG Interpretation  Date/Time:  Wednesday August 31 2022 13:16:49 EDT Ventricular Rate:  77 PR Interval:  152 QRS Duration: 108 QT Interval:  424 QTC Calculation: 479 R Axis:   241 Text Interpretation: Atrial-sensed ventricular-paced rhythm Abnormal ECG When compared with ECG of 09-Aug-2022 11:53, PREVIOUS ECG IS PRESENT Confirmed by Ernie Avena ([id number]) on 08/31/2022 3:00:16 PM  Radiology DG Chest Portable 1 View  Result Date: 08/31/2022 CLINICAL DATA:  Cough.  Weakness. EXAM: PORTABLE CHEST 1 VIEW COMPARISON:  08/09/2022 FINDINGS: Heart size is normal. Pacemaker/AICD appears the same. Mild chronic scarring at the lung apices. Cavitary mass in the left upper lobe remains visible. Surrounding patchy density in the left upper lobe may be worsened. The findings could be due to neoplastic or inflammatory disease or both. No visible effusion. No acute bone finding. IMPRESSION: Cavitary mass in the left upper lobe remains visible. Surrounding patchy density in the left upper lobe may be worsened. The findings could be due to neoplastic or inflammatory disease or both. Electronically Signed   By: Paulina Fusi M.D.   On: 08/31/2022 15:42    Procedures Procedures    Medications Ordered in ED Medications - No data to display  ED Course/ Medical Decision Making/ A&P                             Medical Decision Making Amount and/or Complexity of Data Reviewed Labs: ordered. Radiology: ordered.  Risk Prescription drug management.     82 year old female with medical history significant for OSA, CAD,  HTN,  HLD, DM, CKD, CHF and nonischemic cardiomyopathy, GERD anxiety depression who presents to the emergency department with intermittent confusion and generalized weakness and fatigue.  The patient was mildly confused in the office at her PCP yesterday and was advised to present to the emergency department for further evaluation.  She presents today but states that "I feel fine."  She states that she is ready to go home.  She endorses some increased urinary frequency.  She states that she has a chronic cough but denies any increased production of sputum.  She denies any fevers or chills.  She denies any chest pain or shortness of breath or lower extremity swelling.  The patient had a lung biopsy a few months ago and states "I have not felt right since the biopsy."  She endorses a chronic cough.  She was told the results of her biopsy were negative.  She did have a bronchoscopy which was positive on culture for Mycobacterium avium complex.  She has yet to follow-up with infectious disease regarding this.  On arrival, the patient was vitally stable, afebrile, not tachycardic or tachypneic, saturating 96% on room air.  Initial blood pressure of 99/59, improved to 109/64 without intervention.  Patient with a reassuring physical exam with a normal neurologic exam, moist mucous membranes.  Laboratory evaluation  Laboratory evaluation significant for CBC with a mild leukocytosis to 10.7, no anemia hemoglobin 12.2, CBG 132, BMP with mild hyponatremia 132, hyperglycemia 141, elevated serum creatinine to 1.57 but her baseline appears to range from 1.2-1.4.  No clear AKI.  The patient states that she is able to orally rehydrate.  She denies any increasing shortness of breath or chest discomfort.  She does endorse increased urinary frequency.  CXR: IMPRESSION:  Cavitary mass in the left upper lobe remains visible. Surrounding  patchy density in the left upper lobe may be worsened. The findings  could be due to  neoplastic or inflammatory disease or both.    Her urinalysis was positive for urinary tract infection with large leukocytes, greater than 50 WBCs and few bacteria present.  In the setting of her increased frequency, will treat for UTI.  I did consult on-call ID regarding initiating the patient on ABX for her MAC infection.  I spoke with Dr. Thedore Mins of infectious disease.  She states that the patient does not need to be urgently started on MAC treatment but needs to follow-up with ID in clinic at her scheduled appointment July 8.  Will treat the patient with Keflex and have the patient follow-up outpatient with her PCP.  Stable for discharge.   Final Clinical Impression(s) / ED Diagnoses Final diagnoses:  Generalized weakness  Acute cystitis without hematuria    Rx / DC Orders ED Discharge Orders          Ordered    cephALEXin (KEFLEX) 500 MG capsule  2 times daily        08/31/22 1532              Ernie Avena, MD 08/31/22 1551

## 2022-09-02 LAB — URINE CULTURE

## 2022-09-02 LAB — PTH, INTACT AND CALCIUM

## 2022-09-02 LAB — TIQ-NTM

## 2022-09-06 ENCOUNTER — Ambulatory Visit: Payer: Medicare HMO | Admitting: Internal Medicine

## 2022-09-06 DIAGNOSIS — I5032 Chronic diastolic (congestive) heart failure: Secondary | ICD-10-CM

## 2022-09-06 DIAGNOSIS — Z95810 Presence of automatic (implantable) cardiac defibrillator: Secondary | ICD-10-CM

## 2022-09-09 LAB — FUNGAL ORGANISM REFLEX

## 2022-09-09 LAB — FUNGUS CULTURE WITH STAIN

## 2022-09-09 LAB — FUNGUS CULTURE RESULT

## 2022-09-09 NOTE — Progress Notes (Signed)
No ICM remote transmission received for 08/29/2022 and next ICM transmission scheduled for 09/19/2022.   

## 2022-09-12 ENCOUNTER — Other Ambulatory Visit: Payer: Self-pay

## 2022-09-12 ENCOUNTER — Ambulatory Visit: Payer: Medicare HMO | Attending: Internal Medicine | Admitting: Internal Medicine

## 2022-09-12 ENCOUNTER — Encounter: Payer: Self-pay | Admitting: Internal Medicine

## 2022-09-12 ENCOUNTER — Encounter (INDEPENDENT_AMBULATORY_CARE_PROVIDER_SITE_OTHER): Payer: Medicare HMO | Admitting: Infectious Diseases

## 2022-09-12 VITALS — BP 94/58 | HR 79 | Ht 66.0 in

## 2022-09-12 DIAGNOSIS — I5032 Chronic diastolic (congestive) heart failure: Secondary | ICD-10-CM

## 2022-09-12 DIAGNOSIS — A310 Pulmonary mycobacterial infection: Secondary | ICD-10-CM | POA: Insufficient documentation

## 2022-09-12 DIAGNOSIS — Z4502 Encounter for adjustment and management of automatic implantable cardiac defibrillator: Secondary | ICD-10-CM | POA: Diagnosis not present

## 2022-09-12 DIAGNOSIS — I10 Essential (primary) hypertension: Secondary | ICD-10-CM | POA: Diagnosis not present

## 2022-09-12 DIAGNOSIS — Z79899 Other long term (current) drug therapy: Secondary | ICD-10-CM

## 2022-09-12 DIAGNOSIS — Z95810 Presence of automatic (implantable) cardiac defibrillator: Secondary | ICD-10-CM

## 2022-09-12 DIAGNOSIS — Z01812 Encounter for preprocedural laboratory examination: Secondary | ICD-10-CM

## 2022-09-12 DIAGNOSIS — I255 Ischemic cardiomyopathy: Secondary | ICD-10-CM | POA: Diagnosis not present

## 2022-09-12 DIAGNOSIS — I951 Orthostatic hypotension: Secondary | ICD-10-CM

## 2022-09-12 DIAGNOSIS — J984 Other disorders of lung: Secondary | ICD-10-CM

## 2022-09-12 DIAGNOSIS — R7989 Other specified abnormal findings of blood chemistry: Secondary | ICD-10-CM | POA: Diagnosis not present

## 2022-09-12 LAB — CUP PACEART INCLINIC DEVICE CHECK
Battery Remaining Longevity: 0 mo
Brady Statistic RA Percent Paced: 11 %
Brady Statistic RV Percent Paced: 99.96 %
Date Time Interrogation Session: 20240708133424
HighPow Impedance: 81 Ohm
Implantable Lead Connection Status: 753985
Implantable Lead Connection Status: 753985
Implantable Lead Connection Status: 753985
Implantable Lead Implant Date: 20160406
Implantable Lead Implant Date: 20160406
Implantable Lead Implant Date: 20160406
Implantable Lead Location: 753858
Implantable Lead Location: 753859
Implantable Lead Location: 753860
Implantable Lead Model: 7122
Implantable Pulse Generator Implant Date: 20160406
Lead Channel Impedance Value: 1512.5 Ohm
Lead Channel Impedance Value: 375 Ohm
Lead Channel Impedance Value: 687.5 Ohm
Lead Channel Pacing Threshold Amplitude: 0.75 V
Lead Channel Pacing Threshold Amplitude: 0.75 V
Lead Channel Pacing Threshold Amplitude: 0.75 V
Lead Channel Pacing Threshold Amplitude: 0.75 V
Lead Channel Pacing Threshold Amplitude: 0.75 V
Lead Channel Pacing Threshold Amplitude: 0.75 V
Lead Channel Pacing Threshold Pulse Width: 0.5 ms
Lead Channel Pacing Threshold Pulse Width: 0.5 ms
Lead Channel Pacing Threshold Pulse Width: 0.5 ms
Lead Channel Pacing Threshold Pulse Width: 0.5 ms
Lead Channel Pacing Threshold Pulse Width: 0.5 ms
Lead Channel Pacing Threshold Pulse Width: 0.5 ms
Lead Channel Sensing Intrinsic Amplitude: 12 mV
Lead Channel Sensing Intrinsic Amplitude: 5 mV
Lead Channel Setting Pacing Amplitude: 1.75 V
Lead Channel Setting Pacing Amplitude: 2 V
Lead Channel Setting Pacing Amplitude: 2 V
Lead Channel Setting Pacing Pulse Width: 0.5 ms
Lead Channel Setting Pacing Pulse Width: 0.5 ms
Lead Channel Setting Sensing Sensitivity: 0.5 mV
Pulse Gen Serial Number: 7199559

## 2022-09-12 MED ORDER — CARVEDILOL 6.25 MG PO TABS: 6.2500 mg | ORAL_TABLET | Freq: Two times a day (BID) | ORAL | 3 refills | Status: DC

## 2022-09-12 NOTE — Progress Notes (Unsigned)
Patient Active Problem List   Diagnosis Date Noted   Weakness 08/30/2022   Hypocalcemia 08/30/2022   Hyponatremia 08/30/2022   Mycobacterium avium complex (HCC) 08/30/2022   Lung mass 08/09/2022   Pain of upper abdomen 07/05/2022   Insomnia 07/05/2022   Cavitary lesion of lung 06/15/2022   PAD (peripheral artery disease) (HCC) 06/10/2022   Encounter for general adult medical examination with abnormal findings 06/10/2022   Chronic midline thoracic back pain 05/13/2022   CAP (community acquired pneumonia) 05/13/2022   Ischemic cardiomyopathy 01/25/2022   CHF (congestive heart failure) (HCC) 01/25/2022   Orthostatic hypotension 01/25/2022   Depression with anxiety 10/05/2021   Abnormal weight loss 08/21/2020   Benign neoplasm of colon 08/21/2020   Constipation 08/21/2020   Diabetes mellitus type 2 with neurological manifestations (HCC) 08/21/2020   Diabetic polyneuropathy (HCC) 08/21/2020   Diverticular disease of colon 08/21/2020   Dysphagia 08/21/2020   Gastro-esophageal reflux disease without esophagitis 08/21/2020   Incontinence of feces 08/21/2020   Osteoarthritis 08/21/2020   Personal history of colonic polyps 08/21/2020   Vitamin B12 deficiency 08/21/2020   Non-melanoma skin cancer 09/06/2019   Dysphonia 05/16/2019   NICM (nonischemic cardiomyopathy) (HCC) 05/06/2019   Carotid bruit present 05/06/2019   Adjustment reaction with anxiety and depression 04/30/2017   Hypothyroidism 04/29/2017   DJD (degenerative joint disease), lumbar 04/29/2017   Obesity 02/15/2017   Stenosis of intervertebral foramina 08/08/2016   History of laryngeal cancer 06/09/2016   S/P coronary artery stent placement 05/30/2016   Positive urine drug screen 05/30/2016   Tachycardia 05/26/2015   Syncope 05/26/2015   ICD (implantable cardioverter-defibrillator), biventricular, in situ 05/26/2015   Chronic kidney disease, stage III (moderate) (HCC) 04/10/2015   Vitamin D deficiency  04/10/2015   Chronic pain    Chronic systolic CHF (congestive heart failure) (HCC)    CAD S/P percutaneous coronary angioplasty    Acute back pain with sciatica 07/01/2014   Dyspnea on exertion    Chest pain 05/01/2013   Tobacco abuse 12/26/2012   LBBB (left bundle branch block) 12/26/2012   Essential hypertension 10/03/2008   OSA (obstructive sleep apnea) 10/03/2008   Chronic obstructive pulmonary disease (HCC) 10/03/2008    Patient's Medications  New Prescriptions   No medications on file  Previous Medications   ACCU-CHEK SOFTCLIX LANCETS LANCETS    TEST BLOOD SUGAR 1 TO 4 TIMES DAILY AS DIRECTED.   ALCOHOL SWABS (DROPSAFE ALCOHOL PREP) 70 % PADS    Apply topically.   ASPIRIN EC 81 MG TABLET    Take 81 mg by mouth every evening.   BLOOD GLUCOSE MONITORING SUPPL (ACCU-CHEK GUIDE) W/DEVICE KIT    USE AS DIRECTED   CHOLECALCIFEROL (VITAMIN D3) 25 MCG (1000 UNIT) TABLET    Take 1,000 Units by mouth every evening.   FUROSEMIDE (LASIX) 40 MG TABLET    TAKE 2 TABLETS ONE TIME DAILY IN THE MORNING AND TAKE 1 TABLET EVERY EVENING (MUST KEEP APPT FOR FUTURE REFILLS)   GARLIC 500 MG CAPS    Take 500 mg by mouth daily.   GLUCOSE BLOOD (ACCU-CHEK GUIDE) TEST STRIP    TEST BLOOD SUGAR 1 TO 4 TIMES DAILY AS DIRECTED   INSULIN DEGLUDEC (TRESIBA FLEXTOUCH) 100 UNIT/ML FLEXTOUCH PEN    Inject 16 Units into the skin daily.   INSULIN PEN NEEDLE (BD PEN NEEDLE MICRO U/F) 32G X 6 MM MISC    USE NEW NEEDLE FOR EACH INJECTION OF INSULIN, FOUR TIMES DAILY  IPRATROPIUM (ATROVENT) 0.03 % NASAL SPRAY    Place 2 sprays into both nostrils 2 (two) times daily.   LEVOTHYROXINE (SYNTHROID) 100 MCG TABLET    Take 1 tablet (100 mcg total) by mouth daily before breakfast.   NITROGLYCERIN (NITROSTAT) 0.4 MG SL TABLET    Place 1 tablet (0.4 mg total) under the tongue every 5 (five) minutes as needed for chest pain.   OXYCODONE-ACETAMINOPHEN (PERCOCET/ROXICET) 5-325 MG TABLET    Take 1 tablet by mouth in the morning and  at bedtime.   POTASSIUM CHLORIDE SA (KLOR-CON M) 20 MEQ TABLET    TAKE 1 TABLET TWICE DAILY ON MONDAY, WEDNESDAY AND FRIDAY (DAYS YOU TAKE LASIX/FUROSEMIDE)   PROPYLENE GLYCOL (SYSTANE COMPLETE) 0.6 % SOLN    Place 1 drop into both eyes 3 (three) times daily as needed (dry/irritated eyes.).   RAMELTEON (ROZEREM) 8 MG TABLET    Take 1 tablet (8 mg total) by mouth at bedtime.   SERTRALINE (ZOLOFT) 25 MG TABLET    Take 1 tablet (25 mg total) by mouth daily.   SIMVASTATIN (ZOCOR) 20 MG TABLET    Take 1 tablet (20 mg total) by mouth daily with supper.   TRAZODONE (DESYREL) 150 MG TABLET    Take 150 mg by mouth at bedtime.   TRIAMCINOLONE CREAM (KENALOG) 0.1 %    APPLY ONE APPLICATION TOPICALLY TWO TIMES DAILY  Modified Medications   No medications on file  Discontinued Medications   No medications on file    Subjective: 82 year old female with prior history of arthritis, chronic pain, CAD, chronic systolic CHF secondary to ischemic/nonischemic cardiomyopathy s/p CRT-D, CKD, DM, HLD, HTN,, hypothyroidism, GERD, OSA, tobacco abuse who is referred back from pulmonary for 6/4 bronch AFB cultures with MAC positive by PCR     Review of Systems: ROS  Past Medical History:  Diagnosis Date   AICD (automatic cardioverter/defibrillator) present    Anemia    in the past   Anxiety    Arthritis    Chronic pain    a. Prior h/o chronic pain on methadone.   Chronic systolic CHF (congestive heart failure) (HCC)    a. mixed ischemic/non-ischemic cardiomyopathy. b. s/p CRT-D in 06/2014.   CKD (chronic kidney disease), stage III (HCC)    Complication of anesthesia    hard time waking her up from general surgery   Coronary artery disease    a. remote RCA stenting in 2008 with non-DES. b. Cath 06/2014 following abnormal nuc: Stable, unchanged from prior cath, patent stent and 40% LM.   Depression    Diabetes mellitus (HCC)    GERD (gastroesophageal reflux disease)    Hyperlipidemia    Hypertension     Hypothyroidism    LBBB (left bundle branch block)    Nonischemic cardiomyopathy (HCC)    OSA (obstructive sleep apnea)    AHI-9.77/hr, during REM-50.32/hr  - no cpap at this time   Pneumonia    January 2024   Tobacco abuse    Past Surgical History:  Procedure Laterality Date   BACK SURGERY  2013   BI-VENTRICULAR IMPLANTABLE CARDIOVERTER DEFIBRILLATOR N/A 06/11/2014   STJ CRTD implanted by Dr Graciela Husbands   BRONCHIAL BIOPSY  08/09/2022   Procedure: BRONCHIAL BIOPSIES;  Surgeon: Josephine Igo, DO;  Location: [facility];  Service: Pulmonary;;   BRONCHIAL BRUSHINGS  08/09/2022   Procedure: BRONCHIAL BRUSHINGS;  Surgeon: Josephine Igo, DO;  Location: [facility];  Service: Pulmonary;;   BRONCHIAL NEEDLE ASPIRATION BIOPSY  08/09/2022  Procedure: BRONCHIAL NEEDLE ASPIRATION BIOPSIES;  Surgeon: Josephine Igo, DO;  Location: [facility];  Service: Pulmonary;;   CARDIAC CATHETERIZATION  12/14/2006   RCA stented with a 3.0 [employer] Liberte stent resulting in a reduction of 75% to 0% residual   CHOLECYSTECTOMY     20 years ago   COLONOSCOPY WITH PROPOFOL N/A 12/15/2014   Procedure: COLONOSCOPY WITH PROPOFOL;  Surgeon: Vida Rigger, MD;  Location: [facility];  Service: Endoscopy;  Laterality: N/A;   EYE SURGERY     bilateral cataract surgery    FINE NEEDLE ASPIRATION  08/09/2022   Procedure: FINE NEEDLE ASPIRATION (FNA) LINEAR;  Surgeon: Josephine Igo, DO;  Location: [facility];  Service: Pulmonary;;   LEFT AND RIGHT HEART CATHETERIZATION WITH CORONARY ANGIOGRAM N/A 05/29/2014   Procedure: LEFT AND RIGHT HEART CATHETERIZATION WITH CORONARY ANGIOGRAM;  Surgeon: Runell Gess, MD;  Location: [facility] CATH LAB;  Service: Cardiovascular;  Laterality: N/A;   LEFT HEART CATHETERIZATION WITH CORONARY ANGIOGRAM N/A 12/27/2012   Procedure: LEFT HEART CATHETERIZATION WITH CORONARY ANGIOGRAM;  Surgeon: Runell Gess, MD;  Location: [facility] CATH LAB;  Service: Cardiovascular;  Laterality: N/A;   VIDEO  BRONCHOSCOPY WITH ENDOBRONCHIAL ULTRASOUND  08/09/2022   Procedure: VIDEO BRONCHOSCOPY WITH ENDOBRONCHIAL ULTRASOUND;  Surgeon: Josephine Igo, DO;  Location: [facility];  Service: Pulmonary;;    Social History   Tobacco Use   Smoking status: Every Day    Packs/day: 1.00    Years: 58.00    Additional pack years: 0.00    Total pack years: 58.00    Types: Cigarettes    Start date: 12/26/1951    Passive exposure: Past   Smokeless tobacco: Never   Tobacco comments:    Smoke 2 packs of cigarettes a week. 08/23/2022 Tay  Vaping Use   Vaping Use: Never used  Substance Use Topics   Alcohol use: No    Alcohol/week: 0.0 standard drinks of alcohol   Drug use: No    Family History  Problem Relation Age of Onset   Stroke Mother    Hypertension Mother    Coronary artery disease Father    Stroke Brother    Heart disease Brother    Other Brother        H1N1 VIRUS   Healthy Sister    Healthy Sister     Allergies  Allergen Reactions   Valium [Diazepam] Swelling    face    Health Maintenance  Topic Date Due   DTaP/Tdap/Td (1 - Tdap) Never done   DEXA SCAN  Never done   OPHTHALMOLOGY EXAM  01/13/2021   Diabetic kidney evaluation - Urine ACR  10/06/2022   INFLUENZA VACCINE  10/06/2022   HEMOGLOBIN A1C  11/12/2022   FOOT EXAM  12/11/2022   Lung Cancer Screening  06/09/2023   Medicare Annual Wellness (AWV)  06/10/2023   Diabetic kidney evaluation - eGFR measurement  08/31/2023   Pneumonia Vaccine 30+ Years old  Completed   Zoster Vaccines- Shingrix  Completed   HPV VACCINES  Aged Out   COVID-19 Vaccine  Discontinued    Objective:  There were no vitals filed for this visit. There is no height or weight on file to calculate BMI.  Physical Exam Constitutional:      Appearance: Normal appearance.  HENT:     Head: Normocephalic and atraumatic.      Mouth: Mucous membranes are moist.  Eyes:    Conjunctiva/sclera: Conjunctivae normal.     Pupils: Pupils are equal,  round, and reactive to light.   Cardiovascular:     Rate and Rhythm: Normal rate and regular rhythm.     Heart sounds: No murmur heard. No friction rub. No gallop.   Pulmonary:     Effort: Pulmonary effort is normal.     Breath sounds: Normal breath sounds.   Abdominal:     General: Non distended     Palpations: soft.   Musculoskeletal:        General: Normal range of motion.   Skin:    General: Skin is warm and dry.     Comments:  Neurological:     General: grossly non focal     Mental Status: awake, alert and oriented to person, place, and time.   Psychiatric:        Mood and Affect: Mood normal.   Lab Results Lab Results  Component Value Date   WBC 10.7 (H) 08/31/2022   HGB 12.1 08/31/2022   HCT 36.8 08/31/2022   MCV 92.5 08/31/2022   PLT 379 08/31/2022    Lab Results  Component Value Date   CREATININE 1.57 (H) 08/31/2022   BUN 27 (H) 08/31/2022   NA 132 (L) 08/31/2022   K 4.0 08/31/2022   CL 94 (L) 08/31/2022   CO2 26 08/31/2022    Lab Results  Component Value Date   ALT 30 08/30/2022   AST 22 08/30/2022   ALKPHOS 86 08/30/2022   BILITOT 0.7 08/30/2022    Lab Results  Component Value Date   CHOL 141 10/05/2021   HDL 52.20 10/05/2021   LDLCALC 53 10/05/2021   TRIG 179.0 (H) 10/05/2021   CHOLHDL 3 10/05/2021   No results found for: "LABRPR", "RPRTITER" No results found for: "HIV1RNAQUANT", "HIV1RNAVL", "CD4TABS"  Microbiology  Results for orders placed or performed during the hospital encounter of 08/31/22  Urine Culture     Status: Abnormal   Collection Time: 08/31/22  1:25 PM   Specimen: Urine, Clean Catch  Result Value Ref Range Status   Specimen Description URINE, CLEAN CATCH  Final   Special Requests   Final    NONE Performed at [facility], [address] [place], [place] [address]    Culture MULTIPLE SPECIES PRESENT, SUGGEST RECOLLECTION (A)  Final   Report Status 09/02/2022 FINAL  Final   *Note: Due to a large number of  results and/or encounters for the requested time period, some results have not been displayed. A complete set of results can be found in Results Review.   6/4 aerobic and anaerobic cx 1/2 NG and 2/2 normal respiratory flora.  6/4 AFB sputum smear 1/2 positive, 2/2 positive for MAC by PCR 6/4 fungal smear 2/2 negative, cx 2/2 NG ( final )  I have personally spent at least 60 minutes involved in face-to-face and non-face-to-face activities for this patient on the day of the visit. Professional time spent includes the following activities: Preparing to see the patient (review of tests), Obtaining and/or reviewing separately obtained history (admission/discharge record), Performing a medically appropriate examination and/or evaluation , Ordering medications/tests/procedures, referring and communicating with other health care professionals, Documenting clinical information in the EMR, Independently interpreting results (not separately reported), Communicating results to the patient/family/caregiver, Counseling and educating the patient/family/caregiver and Care coordination (not separately reported).   Victoriano Lain, MD [facility] 09/12/2022, 12:33 PM

## 2022-09-12 NOTE — Progress Notes (Signed)
Patient Care Team: Myrlene Broker, MD as PCP - General (Internal Medicine) Runell Gess, MD as PCP - Cardiology (Cardiology) Runell Gess, MD as Consulting Physician (Cardiology) Milagros Evener, MD as Consulting Physician (Psychiatry) Barnett Abu, MD as Consulting Physician (Neurosurgery)   HPI  [name] is a 82 y.o. female Seen in follow-up for CRT- ICD implanted 4/16 for ischemic cardiomyopathy with prior stenting.  She has a history of congestive heart failure and left bundle branch block. Near normalization of LV function Prior syncope.  The patient denies chest pain, shortness of breath, nocturnal dyspnea, orthopnea or peripheral edema.  There have been no palpitations or syncope.  Complains of weakness and lightheadedness upon standing.  Had a lung mass and underwent biopsy.  6/24 squamous metaplasia but without evidence of malignancy.   DATE TEST EF   4/16 Echo   20-25 %   8/17 Echo   50-55 %         Date Cr K Hgb TSH  2/19 1.28 4.6   8.440   6/20 1.19 3.8  1.64  9/22 1.20 3.9  2.74  6/24 1.57 4.0 12.1 5.74      Past Medical History:  Diagnosis Date   AICD (automatic cardioverter/defibrillator) present    Anemia    in the past   Anxiety    Arthritis    Chronic pain    a. Prior h/o chronic pain on methadone.   Chronic systolic CHF (congestive heart failure) (HCC)    a. mixed ischemic/non-ischemic cardiomyopathy. b. s/p CRT-D in 06/2014.   CKD (chronic kidney disease), stage III (HCC)    Complication of anesthesia    hard time waking her up from general surgery   Coronary artery disease    a. remote RCA stenting in 2008 with non-DES. b. Cath 06/2014 following abnormal nuc: Stable, unchanged from prior cath, patent stent and 40% LM.   Depression    Diabetes mellitus (HCC)    GERD (gastroesophageal reflux disease)    Hyperlipidemia    Hypertension    Hypothyroidism    LBBB (left bundle branch block)    Nonischemic cardiomyopathy  (HCC)    OSA (obstructive sleep apnea)    AHI-9.77/hr, during REM-50.32/hr  - no cpap at this time   Pneumonia    January 2024   Tobacco abuse     Past Surgical History:  Procedure Laterality Date   BACK SURGERY  2013   BI-VENTRICULAR IMPLANTABLE CARDIOVERTER DEFIBRILLATOR N/A 06/11/2014   STJ CRTD implanted by Dr Graciela Husbands   BRONCHIAL BIOPSY  08/09/2022   Procedure: BRONCHIAL BIOPSIES;  Surgeon: Josephine Igo, DO;  Location: [facility];  Service: Pulmonary;;   BRONCHIAL BRUSHINGS  08/09/2022   Procedure: BRONCHIAL BRUSHINGS;  Surgeon: Josephine Igo, DO;  Location: [facility];  Service: Pulmonary;;   BRONCHIAL NEEDLE ASPIRATION BIOPSY  08/09/2022   Procedure: BRONCHIAL NEEDLE ASPIRATION BIOPSIES;  Surgeon: Josephine Igo, DO;  Location: [facility];  Service: Pulmonary;;   CARDIAC CATHETERIZATION  12/14/2006   RCA stented with a 3.0 [employer] Liberte stent resulting in a reduction of 75% to 0% residual   CHOLECYSTECTOMY     20 years ago   COLONOSCOPY WITH PROPOFOL N/A 12/15/2014   Procedure: COLONOSCOPY WITH PROPOFOL;  Surgeon: Vida Rigger, MD;  Location: [facility];  Service: Endoscopy;  Laterality: N/A;   EYE SURGERY     bilateral cataract surgery    FINE NEEDLE ASPIRATION  08/09/2022  Procedure: FINE NEEDLE ASPIRATION (FNA) LINEAR;  Surgeon: Josephine Igo, DO;  Location: [facility];  Service: Pulmonary;;   LEFT AND RIGHT HEART CATHETERIZATION WITH CORONARY ANGIOGRAM N/A 05/29/2014   Procedure: LEFT AND RIGHT HEART CATHETERIZATION WITH CORONARY ANGIOGRAM;  Surgeon: Runell Gess, MD;  Location: [facility] CATH LAB;  Service: Cardiovascular;  Laterality: N/A;   LEFT HEART CATHETERIZATION WITH CORONARY ANGIOGRAM N/A 12/27/2012   Procedure: LEFT HEART CATHETERIZATION WITH CORONARY ANGIOGRAM;  Surgeon: Runell Gess, MD;  Location: [facility] C[facility] LAB;  Service: Cardiovascular;  Laterality: N/A;   VIDEO BRONCHOSCOPY WITH ENDOBRONCHIAL ULTRASOUND  08/09/2022   Procedure: VIDEO  BRONCHOSCOPY WITH ENDOBRONCHIAL ULTRASOUND;  Surgeon: Josephine Igo, DO;  Location: [facility];  Service: Pulmonary;;    Current Outpatient Medications  Medication Sig Dispense Refill   Accu-Chek Softclix Lancets lancets TEST BLOOD SUGAR 1 TO 4 TIMES DAILY AS DIRECTED. 400 each 11   Alcohol Swabs (DROPSAFE ALCOHOL PREP) 70 % PADS Apply topically.     aspirin EC 81 MG tablet Take 81 mg by mouth every evening.     Blood Glucose Monitoring Suppl (ACCU-CHEK GUIDE) w/Device KIT USE AS DIRECTED 1 kit 0   carvedilol (COREG) 12.5 MG tablet TAKE 1 AND 1/2 TABLETS TWICE DAILY 270 tablet 3   cholecalciferol (VITAMIN D3) 25 MCG (1000 UNIT) tablet Take 1,000 Units by mouth every evening.     furosemide (LASIX) 40 MG tablet TAKE 2 TABLETS ONE TIME DAILY IN THE MORNING AND TAKE 1 TABLET EVERY EVENING (MUST KEEP APPT FOR FUTURE REFILLS) 270 tablet 2   Garlic 500 MG CAPS Take 500 mg by mouth daily.     glucose blood (ACCU-CHEK GUIDE) test strip TEST BLOOD SUGAR 1 TO 4 TIMES DAILY AS DIRECTED 350 strip 11   insulin degludec (TRESIBA FLEXTOUCH) 100 UNIT/ML FlexTouch Pen Inject 16 Units into the skin daily. (Patient taking differently: Inject 16 Units into the skin at bedtime.) 15 mL 3   Insulin Pen Needle (BD PEN NEEDLE MICRO U/F) 32G X 6 MM MISC USE NEW NEEDLE FOR EACH INJECTION OF INSULIN, FOUR TIMES DAILY 100 each 5   ipratropium (ATROVENT) 0.03 % nasal spray Place 2 sprays into both nostrils 2 (two) times daily. (Patient taking differently: Place 2 sprays into both nostrils 2 (two) times daily as needed for rhinitis.) 30 mL 3   levothyroxine (SYNTHROID) 100 MCG tablet Take 1 tablet (100 mcg total) by mouth daily before breakfast. (Patient taking differently: Take 100 mcg by mouth at bedtime.) 90 tablet 3   nitroGLYCERIN (NITROSTAT) 0.4 MG SL tablet Place 1 tablet (0.4 mg total) under the tongue every 5 (five) minutes as needed for chest pain. 25 tablet 3   oxyCODONE-acetaminophen (PERCOCET/ROXICET) 5-325 MG  tablet Take 1 tablet by mouth in the morning and at bedtime.     potassium chloride SA (KLOR-CON M) 20 MEQ tablet TAKE 1 TABLET TWICE DAILY ON MONDAY, WEDNESDAY AND FRIDAY (DAYS YOU TAKE LASIX/FUROSEMIDE) 78 tablet 10   Propylene Glycol (SYSTANE COMPLETE) 0.6 % SOLN Place 1 drop into both eyes 3 (three) times daily as needed (dry/irritated eyes.).     ramelteon (ROZEREM) 8 MG tablet Take 1 tablet (8 mg total) by mouth at bedtime. 30 tablet 3   sertraline (ZOLOFT) 25 MG tablet Take 1 tablet (25 mg total) by mouth daily. 90 tablet 3   simvastatin (ZOCOR) 20 MG tablet Take 1 tablet (20 mg total) by mouth daily with supper. 90 tablet 3   traZODone (DESYREL) 150  MG tablet Take 150 mg by mouth at bedtime.     triamcinolone cream (KENALOG) 0.1 % APPLY ONE APPLICATION TOPICALLY TWO TIMES DAILY 30 g 0   No current facility-administered medications for this visit.    Allergies  Allergen Reactions   Valium [Diazepam] Swelling    face      Review of Systems negative except from HPI and PMH  Physical Exam BP (!) 94/58   Pulse 79   Ht 5\' 6"  (1.676 m)   SpO2 97%   BMI 23.48 kg/m  Well developed and well nourished in no acute distress HENT normal Neck supple with JVP-flat Clear Device pocket well healed; without hematoma or erythema.  There is no tethering  Regular rate and rhythm, no  gallop No  murmur Abd-soft with active BS No Clubbing cyanosis  edema Skin-warm and dry A & Oriented  Grossly normal sensory and motor function  ECG sinus with P synchronous pacing at 79 16/11/42 Negative QRS lead 1RS lead V1  Device function at [employer] changes none  See Paceart for details     Assessment and  Plan Ischemic cardiomyopathy with interval normalization of LV function   Congestive heart failure-chronic-diastolic  Hypertension  Orthostatic hypotension  Diabetes  CRT-D-[employer]   Estimated Creatinine Clearance: 26.8 mL/min (A) (by C-G formula based on SCr of 1.57 mg/dL  (H)).  Grief   Device function at ERI. We have reviewed the benefits and risks of generator replacement.  These include but are not limited to lead fracture and infection.  The patient understands, agrees and is willing to proceed.  Discussed with her regarding end of life and she would like the ICD downgraded to a CRT-P  Continues with hypotension.  Will decrease her carvedilol from 18.75-6.25 twice daily.  Other GDMT is already been removed  Last blood renal function  decrease in GFR to class IV, we will recheck it.

## 2022-09-12 NOTE — Patient Instructions (Addendum)
Medication Instructions:  Your physician has recommended you make the following change in your medication:  DECREASE Carvedilol to 6.25 mg twice daily  *If you need a refill on your cardiac medications before your next appointment, please call your pharmacy*   Lab Work: Today: BMET If you have labs (blood work) drawn today and your tests are completely normal, you will receive your results only by: MyChart Message (if you have MyChart) OR A paper copy in the mail If you have any lab test that is abnormal or we need to change your treatment, we will call you to review the results.   Testing/Procedures: See instruction letter given to you today with ICD generator change procedure instructions   Follow-Up: At [facility], you and your health needs are our priority.  As part of our continuing mission to provide you with exceptional heart care, we have created designated Provider Care Teams.  These Care Teams include your primary Cardiologist (physician) and Advanced Practice Providers (APPs -  Physician Assistants and Nurse Practitioners) who all work together to provide you with the care you need, when you need it.  Your next appointment:   2 week(s) after your battery change -- the office will schedule this for you  The format for your next appointment:   In Person  Provider:   Device clinic for a wound check {   Thank you for choosing [facility]!!   [phone]

## 2022-09-13 ENCOUNTER — Encounter: Payer: Self-pay | Admitting: Infectious Diseases

## 2022-09-13 ENCOUNTER — Telehealth: Payer: Self-pay | Admitting: Internal Medicine

## 2022-09-13 LAB — BASIC METABOLIC PANEL
BUN/Creatinine Ratio: 13 (ref 12–28)
BUN: 15 mg/dL (ref 8–27)
CO2: 26 mmol/L (ref 20–29)
Calcium: 9.4 mg/dL (ref 8.7–10.3)
Chloride: 95 mmol/L — ABNORMAL LOW (ref 96–106)
Creatinine, Ser: 1.12 mg/dL — ABNORMAL HIGH (ref 0.57–1.00)
Glucose: 116 mg/dL — ABNORMAL HIGH (ref 70–99)
Potassium: 4.1 mmol/L (ref 3.5–5.2)
Sodium: 138 mmol/L (ref 134–144)
eGFR: 50 mL/min/{1.73_m2} — ABNORMAL LOW (ref >59)

## 2022-09-13 NOTE — Telephone Encounter (Signed)
Pt is requesting a callback regarding her procedure she has tomorrow. She stated she'd like the pace maker and defibrillator. If she has to r/s her procedure date due to her changing her mind about wanting both now, she's fine with that. She'll discuss further once called back. Please advise

## 2022-09-13 NOTE — Progress Notes (Signed)
Remote ICD transmission.   

## 2022-09-13 NOTE — Addendum Note (Signed)
Addended by: Geralyn Flash D on: 09/13/2022 12:09 PM   Modules accepted: Level of Service

## 2022-09-13 NOTE — Telephone Encounter (Signed)
Needs to come and get soap We wil clarify the ICD pacemaker issue tomorrow Hold ASA today and tomorrow

## 2022-09-13 NOTE — Telephone Encounter (Signed)
Called pt in regards to procedure scheduled for tomorrow: ICD generator changeout.   Reports talked with children and would like to have PPM and Defibrillator.  Previously told Dr. Graciela Husbands she did not want Defibrillator.   Also, reports never stopped Asprin last dose last night. Advised pt per instruction sheet was to hold for 5 days prior to procedure.  Also does not have CHG shrub thinks she left scrub and instruction sheet in the lab.  Advised can stop by our office for another bottle reports will get from local pharmacy.   Advised will send stat message to MD and scheduler to f/u.

## 2022-09-13 NOTE — Telephone Encounter (Signed)
Called pt reviewed MD response:  Needs to come and get soap We wil clarify the ICD pacemaker issue tomorrow Hold ASA today and tomorrow    Reviewed time and location of tomorrow procedure.  Reviewed medication instructions several times.  Called daughter (ok per DPR) with overview of instructions to ensure pt follows instructions.  All questions answered from pt and daughter.

## 2022-09-13 NOTE — Progress Notes (Addendum)
Patient no showed appt on 7/8 at 1: 45 pm although EMR says visit complete. Office Visit was not done.

## 2022-09-14 ENCOUNTER — Other Ambulatory Visit: Payer: Self-pay

## 2022-09-14 ENCOUNTER — Encounter (HOSPITAL_COMMUNITY)
Admission: RE | Disposition: A | Discharge status: Home / Self Care | Payer: Self-pay | Source: Home / Self Care | Attending: Internal Medicine

## 2022-09-14 ENCOUNTER — Ambulatory Visit (HOSPITAL_COMMUNITY)
Admission: RE | Admit: 2022-09-14 | Discharge: 2022-09-14 | Disposition: A | Discharge status: Home / Self Care | Payer: Medicare HMO | Attending: Internal Medicine | Admitting: Internal Medicine

## 2022-09-14 ENCOUNTER — Encounter (HOSPITAL_COMMUNITY): Payer: Self-pay | Admitting: Internal Medicine

## 2022-09-14 DIAGNOSIS — I951 Orthostatic hypotension: Secondary | ICD-10-CM | POA: Diagnosis not present

## 2022-09-14 DIAGNOSIS — E1122 Type 2 diabetes mellitus with diabetic chronic kidney disease: Secondary | ICD-10-CM | POA: Diagnosis not present

## 2022-09-14 DIAGNOSIS — I2510 Atherosclerotic heart disease of native coronary artery without angina pectoris: Secondary | ICD-10-CM | POA: Insufficient documentation

## 2022-09-14 DIAGNOSIS — I110 Hypertensive heart disease with heart failure: Secondary | ICD-10-CM | POA: Diagnosis not present

## 2022-09-14 DIAGNOSIS — F4321 Adjustment disorder with depressed mood: Secondary | ICD-10-CM | POA: Insufficient documentation

## 2022-09-14 DIAGNOSIS — Z955 Presence of coronary angioplasty implant and graft: Secondary | ICD-10-CM | POA: Insufficient documentation

## 2022-09-14 DIAGNOSIS — Z4502 Encounter for adjustment and management of automatic implantable cardiac defibrillator: Secondary | ICD-10-CM | POA: Insufficient documentation

## 2022-09-14 DIAGNOSIS — Z794 Long term (current) use of insulin: Secondary | ICD-10-CM | POA: Diagnosis not present

## 2022-09-14 DIAGNOSIS — Z79899 Other long term (current) drug therapy: Secondary | ICD-10-CM | POA: Insufficient documentation

## 2022-09-14 DIAGNOSIS — N183 Chronic kidney disease, stage 3 unspecified: Secondary | ICD-10-CM | POA: Diagnosis not present

## 2022-09-14 DIAGNOSIS — I255 Ischemic cardiomyopathy: Secondary | ICD-10-CM | POA: Diagnosis not present

## 2022-09-14 DIAGNOSIS — I447 Left bundle-branch block, unspecified: Secondary | ICD-10-CM | POA: Diagnosis not present

## 2022-09-14 DIAGNOSIS — I5032 Chronic diastolic (congestive) heart failure: Secondary | ICD-10-CM | POA: Diagnosis not present

## 2022-09-14 DIAGNOSIS — I429 Cardiomyopathy, unspecified: Secondary | ICD-10-CM

## 2022-09-14 HISTORY — PX: ICD GENERATOR CHANGEOUT: EP1231

## 2022-09-14 LAB — MAC SUSCEPTIBILITY BROTH
Ciprofloxacin: >8
Clarithromycin: 2
Doxycycline: >8
Linezolid: 32
Minocycline: >8
Moxifloxacin: >4
Rifabutin: 0.5
Rifampin: >4
Streptomycin: >32

## 2022-09-14 LAB — GLUCOSE, CAPILLARY: Glucose-Capillary: 106 mg/dL — ABNORMAL HIGH (ref 70–99)

## 2022-09-14 LAB — ACID FAST ID BY PCR AND SUSCEPTIBILITIES

## 2022-09-14 LAB — ACID FAST CULTURE WITH REFLEXED SENSITIVITIES (MYCOBACTERIA): Acid Fast Culture: POSITIVE — AB

## 2022-09-14 SURGERY — ICD GENERATOR CHANGEOUT

## 2022-09-14 MED ORDER — LIDOCAINE HCL 1 % IJ SOLN
INTRAMUSCULAR | Status: AC
  Filled 2022-09-14: qty 60

## 2022-09-14 MED ORDER — CEFAZOLIN SODIUM-DEXTROSE 2-4 GM/100ML-% IV SOLN
INTRAVENOUS | Status: AC
  Filled 2022-09-14: qty 100

## 2022-09-14 MED ORDER — CEFAZOLIN SODIUM-DEXTROSE 2-4 GM/100ML-% IV SOLN
2.0000 g | INTRAVENOUS | Status: AC
  Administered 2022-09-14: 2 g via INTRAVENOUS

## 2022-09-14 MED ORDER — SODIUM CHLORIDE 0.9 % IV SOLN: INTRAVENOUS | Status: DC

## 2022-09-14 MED ORDER — SODIUM CHLORIDE 0.9 % IV SOLN
80.0000 mg | INTRAVENOUS | Status: AC
  Administered 2022-09-14: 80 mg

## 2022-09-14 MED ORDER — CHLORHEXIDINE GLUCONATE 4 % EX SOLN: 4.0000 | Freq: Once | CUTANEOUS | Status: DC

## 2022-09-14 MED ORDER — LIDOCAINE HCL (PF) 1 % IJ SOLN
INTRAMUSCULAR | Status: DC | PRN
  Administered 2022-09-14: 50 mL

## 2022-09-14 MED ORDER — SODIUM CHLORIDE 0.9 % IV SOLN
INTRAVENOUS | Status: AC
  Filled 2022-09-14: qty 2

## 2022-09-14 SURGICAL SUPPLY — 6 items
CABLE SURGICAL S-101-97-12 (CABLE) ×2 IMPLANT
CRT GALLANT HF DF1 CDHFA500T (ICD Generator) ×1 IMPLANT
DEVICE CRT GALLANT HF DF1 (ICD Generator) IMPLANT
HEMOSTAT SURGICEL 2X4 FIBR (HEMOSTASIS) IMPLANT
PAD DEFIB RADIO PHYSIO CONN (PAD) ×2 IMPLANT
TRAY PACEMAKER INSERTION (PACKS) ×2 IMPLANT

## 2022-09-14 NOTE — Discharge Instructions (Signed)

## 2022-09-14 NOTE — Progress Notes (Signed)
This encounter was created in error - please disregard.

## 2022-09-15 ENCOUNTER — Encounter (HOSPITAL_COMMUNITY): Payer: Self-pay | Admitting: Internal Medicine

## 2022-09-15 ENCOUNTER — Ambulatory Visit: Payer: Medicare HMO | Admitting: Infectious Diseases

## 2022-09-16 NOTE — H&P (Signed)
No changes  For device generator replacement Has elected replacement with HV and not Low voltage down grade

## 2022-09-16 NOTE — Progress Notes (Signed)
ICM remote transmission rescheduled for 10/24/2022 to allow development of CorVue thoracic impedance following device battery replacement on 7/10.

## 2022-09-21 ENCOUNTER — Ambulatory Visit: Payer: Medicare HMO | Admitting: Internal Medicine

## 2022-09-26 DIAGNOSIS — M545 Low back pain, unspecified: Secondary | ICD-10-CM | POA: Diagnosis not present

## 2022-09-26 DIAGNOSIS — M25551 Pain in right hip: Secondary | ICD-10-CM | POA: Diagnosis not present

## 2022-09-28 ENCOUNTER — Ambulatory Visit: Payer: Medicare HMO | Attending: Internal Medicine

## 2022-10-03 DIAGNOSIS — M5116 Intervertebral disc disorders with radiculopathy, lumbar region: Secondary | ICD-10-CM | POA: Diagnosis not present

## 2022-10-12 ENCOUNTER — Other Ambulatory Visit (HOSPITAL_COMMUNITY): Payer: Self-pay | Admitting: Orthopedic Surgery

## 2022-10-12 DIAGNOSIS — M5410 Radiculopathy, site unspecified: Secondary | ICD-10-CM

## 2022-10-12 DIAGNOSIS — M25551 Pain in right hip: Secondary | ICD-10-CM | POA: Diagnosis not present

## 2022-10-16 ENCOUNTER — Other Ambulatory Visit: Payer: Self-pay | Admitting: Cardiovascular Disease

## 2022-10-16 DIAGNOSIS — I5022 Chronic systolic (congestive) heart failure: Secondary | ICD-10-CM

## 2022-10-16 DIAGNOSIS — I2510 Atherosclerotic heart disease of native coronary artery without angina pectoris: Secondary | ICD-10-CM

## 2022-10-20 ENCOUNTER — Other Ambulatory Visit: Payer: Self-pay | Admitting: Orthopedic Surgery

## 2022-10-20 DIAGNOSIS — M5410 Radiculopathy, site unspecified: Secondary | ICD-10-CM

## 2022-10-24 ENCOUNTER — Ambulatory Visit: Payer: Medicare HMO | Attending: Cardiovascular Disease

## 2022-10-24 DIAGNOSIS — I5032 Chronic diastolic (congestive) heart failure: Secondary | ICD-10-CM | POA: Diagnosis not present

## 2022-10-24 DIAGNOSIS — Z95810 Presence of automatic (implantable) cardiac defibrillator: Secondary | ICD-10-CM | POA: Diagnosis not present

## 2022-10-26 NOTE — Progress Notes (Signed)
EPIC Encounter for ICM Monitoring  Patient Name: [name] is a 82 y.o. female Date: 10/26/2022 Primary Care Physican: Myrlene Broker, MD Primary Cardiologist: Allyson Sabal Electrophysiologist: Joycelyn Schmid Pacing:  >99%           12/22/2021 Weight: 143-144 lbs  03/11/2022 Weight: 143 - 145 lbs     05/16/2022 Weight: 143-146 lbs    06/21/2022 Weight: 148 lbs 10/26/2022 Weight: 145 lbs -148 lb                Spoke with patient and heart failure questions reviewed.  Transmission results reviewed.  Pt asymptomatic for fluid accumulation.  She is feeling fine and denies any dehydration symptoms.  She is drinking plenty of fluids.   Corvue thoracic impedance suggesting possible dryness starting 8/9 and returning to baseline 8/18.     Prescribed:  Furosemide 40 mg on take 2 tablets (80 mg total) every morning and 1 tablet (40 mg total) every evening.   Potassium 20 mEq On Monday, Wednesday AND Friday TAKE 1 TAB BY MOUTH 2 TIMES A DAY and on the days you take lasix.   Labs: 09/12/2022 Creatinine 1.12, BUN 15, Potassium 4.1, Sodium 138  08/31/2022 Creatinine 1.57, BUN 27, Potassium 4.0, Sodium 132, GFR 33  08/30/2022 Creatinine 1.32, BUN 28, Potassium 4.5, Sodium 131  08/09/2022 Creatinine 1.30, BUN 19, Potassium 4.1, Sodium 133 A complete set of results can be found in Results Review.   Recommendations:   Encouraged to call if experiencing any fluid symptoms.   Follow-up plan: ICM clinic phone appointment on 11/28/2022.   91 day device clinic remote transmission 12/30/2022.     EP/Cardiology Office Visits:  12/20/2022 with Dr Graciela Husbands.    Recall 06/18/2022 with Dr Allyson Sabal.     Copy of ICM check sent to Dr. Graciela Husbands.   3 month ICM trend: 10/24/2022.    12-14 Month ICM trend:     Karie Soda, RN 10/26/2022 3:12 PM

## 2022-11-11 DIAGNOSIS — M5416 Radiculopathy, lumbar region: Secondary | ICD-10-CM | POA: Diagnosis not present

## 2022-11-11 DIAGNOSIS — F1190 Opioid use, unspecified, uncomplicated: Secondary | ICD-10-CM | POA: Diagnosis not present

## 2022-11-11 DIAGNOSIS — G894 Chronic pain syndrome: Secondary | ICD-10-CM | POA: Diagnosis not present

## 2022-11-28 ENCOUNTER — Ambulatory Visit: Payer: Medicare HMO | Attending: Internal Medicine

## 2022-11-28 DIAGNOSIS — I5032 Chronic diastolic (congestive) heart failure: Secondary | ICD-10-CM

## 2022-11-28 DIAGNOSIS — Z95810 Presence of automatic (implantable) cardiac defibrillator: Secondary | ICD-10-CM

## 2022-12-06 NOTE — Progress Notes (Signed)
EPIC Encounter for ICM Monitoring  Patient Name: [name] is a 82 y.o. female Date: 12/06/2022 Primary Care Physican: Myrlene Broker, MD Primary Cardiologist: Allyson Sabal Electrophysiologist: Joycelyn Schmid Pacing:  >99%           12/22/2021 Weight: 143-144 lbs  03/11/2022 Weight: 143 - 145 lbs     05/16/2022 Weight: 143-146 lbs    06/21/2022 Weight: 148 lbs 10/26/2022 Weight: 145 lbs -148 lb               Transmission results reviewed.     Corvue thoracic impedance suggesting normal fluid levels with the exception of possible fluid accumulation from 9/11-9/16.     Prescribed:  Furosemide 40 mg on take 2 tablets (80 mg total) every morning and 1 tablet (40 mg total) every evening.   Potassium 20 mEq On Monday, Wednesday AND Friday TAKE 1 TAB BY MOUTH 2 TIMES A DAY and on the days you take lasix.   Labs: 09/12/2022 Creatinine 1.12, BUN 15, Potassium 4.1, Sodium 138  08/31/2022 Creatinine 1.57, BUN 27, Potassium 4.0, Sodium 132, GFR 33  08/30/2022 Creatinine 1.32, BUN 28, Potassium 4.5, Sodium 131  08/09/2022 Creatinine 1.30, BUN 19, Potassium 4.1, Sodium 133 A complete set of results can be found in Results Review.   Recommendations:   No changes.   Follow-up plan: ICM clinic phone appointment on 01/02/2023.   91 day device clinic remote transmission 12/30/2022.     EP/Cardiology Office Visits:  12/20/2022 with Dr Graciela Husbands.    Recall 06/18/2022 with Dr Allyson Sabal.     Copy of ICM check sent to Dr. Graciela Husbands.   3 month ICM trend: 11/28/2022.    12-14 Month ICM trend:     Karie Soda, RN 12/06/2022 9:25 AM

## 2022-12-13 ENCOUNTER — Ambulatory Visit: Payer: Medicare HMO | Admitting: Internal Medicine

## 2022-12-20 ENCOUNTER — Ambulatory Visit: Payer: Medicare HMO | Attending: Internal Medicine | Admitting: Internal Medicine

## 2022-12-20 DIAGNOSIS — Z95810 Presence of automatic (implantable) cardiac defibrillator: Secondary | ICD-10-CM

## 2022-12-20 DIAGNOSIS — I255 Ischemic cardiomyopathy: Secondary | ICD-10-CM

## 2022-12-20 DIAGNOSIS — I5032 Chronic diastolic (congestive) heart failure: Secondary | ICD-10-CM

## 2022-12-20 DIAGNOSIS — I951 Orthostatic hypotension: Secondary | ICD-10-CM

## 2022-12-21 ENCOUNTER — Encounter: Payer: Self-pay | Admitting: Internal Medicine

## 2022-12-30 ENCOUNTER — Ambulatory Visit (INDEPENDENT_AMBULATORY_CARE_PROVIDER_SITE_OTHER): Payer: Medicare HMO

## 2022-12-30 DIAGNOSIS — I255 Ischemic cardiomyopathy: Secondary | ICD-10-CM

## 2023-01-02 ENCOUNTER — Ambulatory Visit: Payer: Medicare HMO | Attending: Internal Medicine

## 2023-01-02 DIAGNOSIS — I5032 Chronic diastolic (congestive) heart failure: Secondary | ICD-10-CM | POA: Diagnosis not present

## 2023-01-02 DIAGNOSIS — Z95810 Presence of automatic (implantable) cardiac defibrillator: Secondary | ICD-10-CM

## 2023-01-02 LAB — CUP PACEART REMOTE DEVICE CHECK
Battery Remaining Longevity: 90 mo
Battery Remaining Percentage: 94 %
Battery Voltage: 3.01 V
Brady Statistic AP VP Percent: 1 %
Brady Statistic AP VS Percent: 1 %
Brady Statistic AS VP Percent: 99 %
Brady Statistic AS VS Percent: 1 %
Brady Statistic RA Percent Paced: 1 %
Date Time Interrogation Session: 20241025020621
HighPow Impedance: 82 Ohm
Implantable Lead Connection Status: 753985
Implantable Lead Connection Status: 753985
Implantable Lead Connection Status: 753985
Implantable Lead Implant Date: 20160406
Implantable Lead Implant Date: 20160406
Implantable Lead Implant Date: 20160406
Implantable Lead Location: 753858
Implantable Lead Location: 753859
Implantable Lead Location: 753860
Implantable Lead Model: 7122
Implantable Pulse Generator Implant Date: 20240710
Lead Channel Impedance Value: 1550 Ohm
Lead Channel Impedance Value: 390 Ohm
Lead Channel Impedance Value: 630 Ohm
Lead Channel Pacing Threshold Amplitude: 0.75 V
Lead Channel Pacing Threshold Amplitude: 1 V
Lead Channel Pacing Threshold Amplitude: 1.25 V
Lead Channel Pacing Threshold Pulse Width: 0.5 ms
Lead Channel Pacing Threshold Pulse Width: 0.5 ms
Lead Channel Pacing Threshold Pulse Width: 0.5 ms
Lead Channel Sensing Intrinsic Amplitude: 11.7 mV
Lead Channel Sensing Intrinsic Amplitude: 5 mV
Lead Channel Setting Pacing Amplitude: 2.5 V
Lead Channel Setting Pacing Amplitude: 2.5 V
Lead Channel Setting Pacing Amplitude: 2.5 V
Lead Channel Setting Pacing Pulse Width: 0.5 ms
Lead Channel Setting Pacing Pulse Width: 0.5 ms
Lead Channel Setting Sensing Sensitivity: 0.5 mV
Pulse Gen Serial Number: 111070247

## 2023-01-06 ENCOUNTER — Telehealth: Payer: Self-pay

## 2023-01-06 NOTE — Progress Notes (Signed)
EPIC Encounter for ICM Monitoring  Patient Name: [name] is a 82 y.o. female Date: 01/06/2023 Primary Care Physican: Myrlene Broker, MD Primary Cardiologist: Allyson Sabal Electrophysiologist: Joycelyn Schmid Pacing:  >99%           12/22/2021 Weight: 143-144 lbs  03/11/2022 Weight: 143 - 145 lbs     05/16/2022 Weight: 143-146 lbs    06/21/2022 Weight: 148 lbs 10/26/2022 Weight: 145 lbs -148 lb               Transmission results reviewed.     Corvue thoracic impedance suggesting normal fluid levels with the exception of possible fluid accumulation from 9/11-9/16.     Prescribed:  Furosemide 40 mg on take 2 tablets (80 mg total) every morning and 1 tablet (40 mg total) every evening.   Potassium 20 mEq On Monday, Wednesday AND Friday TAKE 1 TAB BY MOUTH 2 TIMES A DAY and on the days you take lasix.   Labs: 09/12/2022 Creatinine 1.12, BUN 15, Potassium 4.1, Sodium 138  08/31/2022 Creatinine 1.57, BUN 27, Potassium 4.0, Sodium 132, GFR 33  08/30/2022 Creatinine 1.32, BUN 28, Potassium 4.5, Sodium 131  08/09/2022 Creatinine 1.30, BUN 19, Potassium 4.1, Sodium 133 A complete set of results can be found in Results Review.   Recommendations:   No changes.   Follow-up plan: ICM clinic phone appointment on 02/06/2023.   91 day device clinic remote transmission 03/31/2023.     EP/Cardiology Office Visits:  Missed 12/20/2022 with Dr Graciela Husbands and has not rescheduled.    Recall 06/18/2022 with Dr Allyson Sabal.     Copy of ICM check sent to Dr. Graciela Husbands.   3 month ICM trend: 01/02/2023.    12-14 Month ICM trend:     Karie Soda, RN 01/06/2023 12:34 PM

## 2023-01-06 NOTE — Telephone Encounter (Signed)
Remote ICM transmission received.  Attempted call to patient regarding ICM remote transmission and line was busy

## 2023-01-11 ENCOUNTER — Other Ambulatory Visit: Payer: Self-pay | Admitting: Cardiovascular Disease

## 2023-01-16 ENCOUNTER — Ambulatory Visit: Payer: Medicare HMO | Admitting: Internal Medicine

## 2023-01-17 ENCOUNTER — Encounter: Payer: Self-pay | Admitting: Internal Medicine

## 2023-01-17 ENCOUNTER — Ambulatory Visit (INDEPENDENT_AMBULATORY_CARE_PROVIDER_SITE_OTHER): Payer: Medicare HMO | Admitting: Internal Medicine

## 2023-01-17 VITALS — BP 118/64 | HR 64 | Temp 98.5°F | Ht 66.0 in | Wt 141.0 lb

## 2023-01-17 DIAGNOSIS — F332 Major depressive disorder, recurrent severe without psychotic features: Secondary | ICD-10-CM | POA: Diagnosis not present

## 2023-01-17 DIAGNOSIS — A310 Pulmonary mycobacterial infection: Secondary | ICD-10-CM

## 2023-01-17 DIAGNOSIS — Z720 Tobacco use: Secondary | ICD-10-CM | POA: Diagnosis not present

## 2023-01-17 DIAGNOSIS — E559 Vitamin D deficiency, unspecified: Secondary | ICD-10-CM

## 2023-01-17 DIAGNOSIS — E039 Hypothyroidism, unspecified: Secondary | ICD-10-CM | POA: Diagnosis not present

## 2023-01-17 DIAGNOSIS — E538 Deficiency of other specified B group vitamins: Secondary | ICD-10-CM | POA: Diagnosis not present

## 2023-01-17 DIAGNOSIS — E1149 Type 2 diabetes mellitus with other diabetic neurological complication: Secondary | ICD-10-CM

## 2023-01-17 DIAGNOSIS — E871 Hypo-osmolality and hyponatremia: Secondary | ICD-10-CM | POA: Diagnosis not present

## 2023-01-17 LAB — T4, FREE: Free T4: 1.1 ng/dL (ref 0.60–1.60)

## 2023-01-17 LAB — FERRITIN: Ferritin: 374.2 ng/mL — ABNORMAL HIGH (ref 10.0–291.0)

## 2023-01-17 LAB — VITAMIN B12: Vitamin B-12: 625 pg/mL (ref 211–911)

## 2023-01-17 LAB — TSH: TSH: 7.69 u[IU]/mL — ABNORMAL HIGH (ref 0.35–5.50)

## 2023-01-17 LAB — HEMOGLOBIN A1C: Hgb A1c MFr Bld: 11.4 % — ABNORMAL HIGH (ref 4.6–6.5)

## 2023-01-17 MED ORDER — MIRTAZAPINE 15 MG PO TABS: 15.0000 mg | ORAL_TABLET | Freq: Every day | ORAL | 1 refills | Status: DC

## 2023-01-17 NOTE — Progress Notes (Signed)
Remote ICD transmission.   

## 2023-01-17 NOTE — Assessment & Plan Note (Signed)
Severe at this time. Review of medication dispense she is not filling zoloft. She is unsure if she ever took it or if it worked. She has struggled with depression over many years. She is without a social support network at this time we talked about many avenues she could pursue. Rx mirtazepine 15 mg at bedtime to help with sleep and depression. She thinks she has taken it in the past does not recall any side effects. Needs follow up 1 month.

## 2023-01-17 NOTE — Assessment & Plan Note (Signed)
Worsening fatigue checking TSH and free T4. Adjust levothyroxine 100 mcg daily as needed.

## 2023-01-17 NOTE — Assessment & Plan Note (Signed)
She has not seen ID and denies any cough or SOB at this time. Her activity is extremely limited at this time and it seems unlikely that she would benefit from treatment at this time.

## 2023-01-17 NOTE — Assessment & Plan Note (Signed)
Not able to make quit attempt at this time.

## 2023-01-17 NOTE — Patient Instructions (Addendum)
We have sent in mirtazepine to take 1 pill at night time that will help with depression and sleep.

## 2023-01-17 NOTE — Assessment & Plan Note (Signed)
Checking vitamin D level and adjust as needed.  

## 2023-01-17 NOTE — Assessment & Plan Note (Signed)
Checking B12 and adjust as needed. 

## 2023-01-17 NOTE — Assessment & Plan Note (Signed)
Checking CMP.  

## 2023-01-17 NOTE — Assessment & Plan Note (Signed)
Checking CMP for follow up. Appetite is poor.

## 2023-01-17 NOTE — Progress Notes (Signed)
   Subjective:   Patient ID: [name], female    DOB: Mar 01, 1941, 82 y.o.   MRN: [id number]  HPI The patient is an 82 YO female coming in for depression and some passive SI. Has no close family or friends left and has tried church and is not happy with progressive nature of church. Just stays in bed watching tv most of the time lately and not focusing on this.  Review of Systems  Constitutional: Negative.   HENT: Negative.    Eyes: Negative.   Respiratory:  Negative for cough, chest tightness and shortness of breath.   Cardiovascular:  Negative for chest pain, palpitations and leg swelling.  Gastrointestinal:  Negative for abdominal distention, abdominal pain, constipation, diarrhea, nausea and vomiting.  Musculoskeletal: Negative.   Skin: Negative.   Neurological: Negative.   Psychiatric/Behavioral:  Positive for decreased concentration, dysphoric mood and sleep disturbance.     Objective:  Physical Exam Constitutional:      Appearance: She is well-developed.  HENT:     Head: Normocephalic and atraumatic.  Cardiovascular:     Rate and Rhythm: Normal rate and regular rhythm.  Pulmonary:     Effort: Pulmonary effort is normal. No respiratory distress.     Breath sounds: Normal breath sounds. No wheezing or rales.  Abdominal:     General: Bowel sounds are normal. There is no distension.     Palpations: Abdomen is soft.     Tenderness: There is no abdominal tenderness. There is no rebound.  Musculoskeletal:     Cervical back: Normal range of motion.  Skin:    General: Skin is warm and dry.  Neurological:     Mental Status: She is alert and oriented to person, place, and time.     Coordination: Coordination normal.     Vitals:   01/17/23 1440  BP: 118/64  Pulse: 64  Temp: 98.5 F (36.9 C)  TempSrc: Oral  SpO2: 98%  Weight: 141 lb (64 kg)  Height: 5\' 6"  (1.676 m)    Assessment & Plan:

## 2023-01-18 LAB — COMPREHENSIVE METABOLIC PANEL
ALT: 11 U/L (ref 0–35)
AST: 11 U/L (ref 0–37)
Albumin: 3.7 g/dL (ref 3.5–5.2)
Alkaline Phosphatase: 72 U/L (ref 39–117)
BUN: 21 mg/dL (ref 6–23)
CO2: 29 meq/L (ref 19–32)
Calcium: 9.8 mg/dL (ref 8.4–10.5)
Chloride: 86 meq/L — ABNORMAL LOW (ref 96–112)
Creatinine, Ser: 1.27 mg/dL — ABNORMAL HIGH (ref 0.40–1.20)
GFR: 39.74 mL/min — ABNORMAL LOW (ref >60.00)
Glucose, Bld: 582 mg/dL (ref 70–99)
Potassium: 3.1 meq/L — ABNORMAL LOW (ref 3.5–5.1)
Sodium: 126 meq/L — ABNORMAL LOW (ref 135–145)
Total Bilirubin: 0.7 mg/dL (ref 0.2–1.2)
Total Protein: 7.6 g/dL (ref 6.0–8.3)

## 2023-01-18 LAB — CBC
HCT: 40.3 % (ref 36.0–46.0)
Hemoglobin: 13.4 g/dL (ref 12.0–15.0)
MCHC: 33.2 g/dL (ref 30.0–36.0)
MCV: 90.6 fL (ref 78.0–100.0)
Platelets: 283 10*3/uL (ref 150.0–400.0)
RBC: 4.45 Mil/uL (ref 3.87–5.11)
RDW: 14.8 % (ref 11.5–15.5)
WBC: 9.8 10*3/uL (ref 4.0–10.5)

## 2023-01-20 ENCOUNTER — Other Ambulatory Visit: Payer: Self-pay | Admitting: Internal Medicine

## 2023-01-20 MED ORDER — GLIMEPIRIDE 1 MG PO TABS: 1.0000 mg | ORAL_TABLET | Freq: Every day | ORAL | 1 refills | Status: DC

## 2023-02-01 DIAGNOSIS — R1111 Vomiting without nausea: Secondary | ICD-10-CM | POA: Diagnosis not present

## 2023-02-01 DIAGNOSIS — R739 Hyperglycemia, unspecified: Secondary | ICD-10-CM | POA: Diagnosis not present

## 2023-02-05 DEATH — deceased

## 2023-02-13 ENCOUNTER — Telehealth: Payer: Self-pay

## 2023-02-13 NOTE — Telephone Encounter (Signed)
Attempted ICM Call to request remote transmission and to advise monitor is disconnected.  No answer and no voice mail option.  My chart message sent as well.  Pt has Pharmacist, hospital.

## 2023-03-03 ENCOUNTER — Telehealth: Payer: Self-pay

## 2023-03-03 NOTE — Telephone Encounter (Signed)
Attempted ICM Call to patient regarding disconnected remote monitor.  No answer or voice mail option.

## 2023-03-13 ENCOUNTER — Ambulatory Visit: Payer: Medicare HMO | Admitting: Pulmonary Disease

## 2023-03-15 NOTE — Progress Notes (Signed)
 No ICM remote transmission received for 03/13/2023 and next ICM transmission scheduled for 03/28/2023.

## 2023-03-28 ENCOUNTER — Telehealth: Payer: Self-pay

## 2023-03-28 NOTE — Telephone Encounter (Signed)
Attempted call to patient regarding missed ICM remote transmission and no answer or voice mail option.  Home monitor is showing as disconnected.

## 2023-03-28 NOTE — Telephone Encounter (Signed)
Spoke with daughter, Garen Lah and she reported patient passed away at home on 2024-02-18.  Expressed sympathy for her and family.  Advised would update the Epic chart with the information.
